# Patient Record
Sex: Female | Born: 1975
Health system: Southern US, Community
[De-identification: ages and names within clinical notes are randomized; demographics above are authoritative.]

## PROBLEM LIST (undated history)

## (undated) DIAGNOSIS — I509 Heart failure, unspecified: Secondary | ICD-10-CM

## (undated) DIAGNOSIS — I252 Old myocardial infarction: Secondary | ICD-10-CM

## (undated) DIAGNOSIS — M1990 Unspecified osteoarthritis, unspecified site: Secondary | ICD-10-CM

## (undated) DIAGNOSIS — F32A Depression, unspecified: Secondary | ICD-10-CM

## (undated) DIAGNOSIS — Z973 Presence of spectacles and contact lenses: Secondary | ICD-10-CM

## (undated) DIAGNOSIS — Z972 Presence of dental prosthetic device (complete) (partial): Secondary | ICD-10-CM

## (undated) DIAGNOSIS — M549 Dorsalgia, unspecified: Secondary | ICD-10-CM

## (undated) DIAGNOSIS — A419 Sepsis, unspecified organism: Secondary | ICD-10-CM

## (undated) DIAGNOSIS — M542 Cervicalgia: Secondary | ICD-10-CM

## (undated) DIAGNOSIS — I219 Acute myocardial infarction, unspecified: Secondary | ICD-10-CM

## (undated) DIAGNOSIS — J189 Pneumonia, unspecified organism: Secondary | ICD-10-CM

## (undated) DIAGNOSIS — Z9889 Other specified postprocedural states: Secondary | ICD-10-CM

## (undated) DIAGNOSIS — K5090 Crohn's disease, unspecified, without complications: Secondary | ICD-10-CM

## (undated) DIAGNOSIS — R42 Dizziness and giddiness: Secondary | ICD-10-CM

## (undated) DIAGNOSIS — Z9641 Presence of insulin pump (external) (internal): Secondary | ICD-10-CM

## (undated) DIAGNOSIS — E7800 Pure hypercholesterolemia, unspecified: Secondary | ICD-10-CM

## (undated) DIAGNOSIS — K8020 Calculus of gallbladder without cholecystitis without obstruction: Secondary | ICD-10-CM

## (undated) DIAGNOSIS — F909 Attention-deficit hyperactivity disorder, unspecified type: Secondary | ICD-10-CM

## (undated) DIAGNOSIS — K219 Gastro-esophageal reflux disease without esophagitis: Secondary | ICD-10-CM

## (undated) DIAGNOSIS — I2510 Atherosclerotic heart disease of native coronary artery without angina pectoris: Secondary | ICD-10-CM

## (undated) DIAGNOSIS — Z9884 Bariatric surgery status: Secondary | ICD-10-CM

## (undated) DIAGNOSIS — G8929 Other chronic pain: Secondary | ICD-10-CM

## (undated) DIAGNOSIS — E785 Hyperlipidemia, unspecified: Secondary | ICD-10-CM

## (undated) DIAGNOSIS — H547 Unspecified visual loss: Secondary | ICD-10-CM

## (undated) DIAGNOSIS — G709 Myoneural disorder, unspecified: Secondary | ICD-10-CM

## (undated) DIAGNOSIS — E119 Type 2 diabetes mellitus without complications: Secondary | ICD-10-CM

## (undated) DIAGNOSIS — I499 Cardiac arrhythmia, unspecified: Secondary | ICD-10-CM

## (undated) DIAGNOSIS — F329 Major depressive disorder, single episode, unspecified: Secondary | ICD-10-CM

## (undated) DIAGNOSIS — G629 Polyneuropathy, unspecified: Secondary | ICD-10-CM

## (undated) DIAGNOSIS — G43909 Migraine, unspecified, not intractable, without status migrainosus: Secondary | ICD-10-CM

## (undated) DIAGNOSIS — F419 Anxiety disorder, unspecified: Secondary | ICD-10-CM

## (undated) DIAGNOSIS — K279 Peptic ulcer, site unspecified, unspecified as acute or chronic, without hemorrhage or perforation: Secondary | ICD-10-CM

## (undated) DIAGNOSIS — G4730 Sleep apnea, unspecified: Secondary | ICD-10-CM

## (undated) DIAGNOSIS — I10 Essential (primary) hypertension: Secondary | ICD-10-CM

## (undated) DIAGNOSIS — T7840XA Allergy, unspecified, initial encounter: Secondary | ICD-10-CM

## (undated) DIAGNOSIS — N189 Chronic kidney disease, unspecified: Secondary | ICD-10-CM

## (undated) DIAGNOSIS — R51 Headache: Secondary | ICD-10-CM

## (undated) DIAGNOSIS — N289 Disorder of kidney and ureter, unspecified: Secondary | ICD-10-CM

## (undated) DIAGNOSIS — D649 Anemia, unspecified: Secondary | ICD-10-CM

## (undated) DIAGNOSIS — I4581 Long QT syndrome: Secondary | ICD-10-CM

## (undated) DIAGNOSIS — G2581 Restless legs syndrome: Secondary | ICD-10-CM

## (undated) DIAGNOSIS — I420 Dilated cardiomyopathy: Secondary | ICD-10-CM

## (undated) HISTORY — DX: Pure hypercholesterolemia, unspecified: E78.00

## (undated) HISTORY — PX: SALPINGOOPHORECTOMY: SHX82

## (undated) HISTORY — DX: Anemia, unspecified: D64.9

## (undated) HISTORY — DX: Anxiety disorder, unspecified: F41.9

## (undated) HISTORY — DX: Calculus of gallbladder without cholecystitis without obstruction: K80.20

## (undated) HISTORY — DX: Major depressive disorder, single episode, unspecified: F32.9

## (undated) HISTORY — PX: OTHER SURGICAL HISTORY: SHX169

## (undated) HISTORY — DX: Old myocardial infarction: I25.2

## (undated) HISTORY — DX: Dorsalgia, unspecified: M54.9

## (undated) HISTORY — DX: Other chronic pain: G89.29

## (undated) HISTORY — DX: Dilated cardiomyopathy: I42.0

## (undated) HISTORY — DX: Cervicalgia: M54.2

## (undated) HISTORY — DX: Depression, unspecified: F32.A

## (undated) HISTORY — DX: Presence of insulin pump (external) (internal): Z96.41

## (undated) HISTORY — DX: Allergy, unspecified, initial encounter: T78.40XA

---

## 2004-07-18 HISTORY — PX: CARDIAC CATHETERIZATION: SHX172

## 2004-08-14 DIAGNOSIS — I219 Acute myocardial infarction, unspecified: Secondary | ICD-10-CM

## 2004-08-14 HISTORY — DX: Acute myocardial infarction, unspecified: I21.9

## 2004-08-14 HISTORY — PX: CERVICAL FUSION: SHX112

## 2007-07-22 ENCOUNTER — Emergency Department: Payer: Self-pay | Admitting: Emergency Medicine

## 2007-10-03 ENCOUNTER — Emergency Department: Payer: Self-pay | Admitting: Internal Medicine

## 2008-06-18 ENCOUNTER — Emergency Department: Payer: Self-pay | Admitting: Emergency Medicine

## 2009-07-10 ENCOUNTER — Emergency Department: Payer: Self-pay | Admitting: Emergency Medicine

## 2009-08-14 HISTORY — PX: BACK SURGERY: SHX140

## 2010-05-12 ENCOUNTER — Emergency Department: Payer: Self-pay | Admitting: Emergency Medicine

## 2010-05-17 ENCOUNTER — Encounter: Admission: RE | Admit: 2010-05-17 | Discharge: 2010-05-17 | Payer: Self-pay | Admitting: Unknown Physician Specialty

## 2010-06-02 ENCOUNTER — Ambulatory Visit: Payer: Self-pay | Admitting: Unknown Physician Specialty

## 2010-06-06 ENCOUNTER — Ambulatory Visit: Payer: Self-pay | Admitting: Anesthesiology

## 2010-06-07 ENCOUNTER — Inpatient Hospital Stay: Payer: Self-pay | Admitting: Unknown Physician Specialty

## 2010-06-12 ENCOUNTER — Emergency Department: Payer: Self-pay | Admitting: Emergency Medicine

## 2010-06-21 ENCOUNTER — Ambulatory Visit: Payer: Self-pay | Admitting: Cardiology

## 2011-03-08 ENCOUNTER — Ambulatory Visit: Payer: Self-pay | Admitting: Pain Medicine

## 2011-03-13 ENCOUNTER — Ambulatory Visit: Payer: Self-pay | Admitting: Pain Medicine

## 2011-04-10 ENCOUNTER — Ambulatory Visit: Payer: Self-pay | Admitting: Pain Medicine

## 2011-04-19 ENCOUNTER — Ambulatory Visit: Payer: Self-pay | Admitting: Pain Medicine

## 2011-05-15 ENCOUNTER — Inpatient Hospital Stay: Payer: Self-pay | Admitting: Family Medicine

## 2011-08-16 ENCOUNTER — Ambulatory Visit: Payer: Self-pay | Admitting: Pain Medicine

## 2011-08-30 ENCOUNTER — Ambulatory Visit: Payer: Self-pay | Admitting: Pain Medicine

## 2011-09-13 ENCOUNTER — Emergency Department: Payer: Self-pay | Admitting: Emergency Medicine

## 2011-09-14 ENCOUNTER — Ambulatory Visit: Payer: Self-pay | Admitting: Pain Medicine

## 2011-10-03 ENCOUNTER — Ambulatory Visit: Payer: Self-pay | Admitting: Pain Medicine

## 2011-11-01 ENCOUNTER — Ambulatory Visit: Payer: Self-pay | Admitting: Pain Medicine

## 2011-11-30 ENCOUNTER — Ambulatory Visit: Payer: Self-pay | Admitting: Pain Medicine

## 2011-12-13 ENCOUNTER — Ambulatory Visit: Payer: Self-pay | Admitting: Pain Medicine

## 2011-12-28 ENCOUNTER — Ambulatory Visit: Payer: Self-pay | Admitting: Pain Medicine

## 2012-01-17 ENCOUNTER — Ambulatory Visit: Payer: Self-pay | Admitting: Pain Medicine

## 2012-01-29 ENCOUNTER — Ambulatory Visit: Payer: Self-pay | Admitting: Pain Medicine

## 2012-02-26 ENCOUNTER — Ambulatory Visit: Payer: Self-pay | Admitting: Pain Medicine

## 2012-03-12 ENCOUNTER — Ambulatory Visit: Payer: Self-pay | Admitting: Pain Medicine

## 2012-03-18 ENCOUNTER — Ambulatory Visit: Payer: Self-pay | Admitting: Pain Medicine

## 2012-04-25 ENCOUNTER — Ambulatory Visit: Payer: Self-pay | Admitting: Pain Medicine

## 2012-04-26 ENCOUNTER — Ambulatory Visit: Payer: Self-pay | Admitting: Sports Medicine

## 2012-05-28 ENCOUNTER — Ambulatory Visit: Payer: Self-pay | Admitting: Pain Medicine

## 2012-06-10 ENCOUNTER — Ambulatory Visit: Payer: Self-pay | Admitting: Pain Medicine

## 2012-06-27 ENCOUNTER — Ambulatory Visit: Payer: Self-pay | Admitting: Pain Medicine

## 2012-07-25 ENCOUNTER — Ambulatory Visit: Payer: Self-pay | Admitting: Pain Medicine

## 2012-08-22 ENCOUNTER — Ambulatory Visit: Payer: Self-pay | Admitting: Pain Medicine

## 2012-09-04 ENCOUNTER — Emergency Department: Payer: Self-pay | Admitting: Internal Medicine

## 2012-09-17 ENCOUNTER — Ambulatory Visit: Payer: Self-pay | Admitting: Pain Medicine

## 2012-09-20 ENCOUNTER — Ambulatory Visit: Payer: Self-pay | Admitting: Orthopedic Surgery

## 2012-09-25 ENCOUNTER — Ambulatory Visit: Payer: Self-pay | Admitting: Pain Medicine

## 2012-10-24 ENCOUNTER — Ambulatory Visit: Payer: Self-pay | Admitting: Pain Medicine

## 2012-11-04 ENCOUNTER — Ambulatory Visit: Payer: Self-pay | Admitting: Pain Medicine

## 2012-11-21 ENCOUNTER — Ambulatory Visit: Payer: Self-pay | Admitting: Pain Medicine

## 2012-12-02 ENCOUNTER — Ambulatory Visit: Payer: Self-pay | Admitting: Pain Medicine

## 2012-12-19 ENCOUNTER — Ambulatory Visit: Payer: Self-pay | Admitting: Pain Medicine

## 2013-01-22 ENCOUNTER — Ambulatory Visit: Payer: Self-pay | Admitting: Pain Medicine

## 2013-02-02 ENCOUNTER — Emergency Department: Payer: Self-pay | Admitting: Internal Medicine

## 2013-02-20 ENCOUNTER — Ambulatory Visit: Payer: Self-pay | Admitting: Pain Medicine

## 2013-03-01 ENCOUNTER — Ambulatory Visit: Payer: Self-pay | Admitting: Pain Medicine

## 2013-03-19 ENCOUNTER — Ambulatory Visit: Payer: Self-pay | Admitting: Pain Medicine

## 2013-04-18 ENCOUNTER — Other Ambulatory Visit: Payer: Self-pay | Admitting: Neurosurgery

## 2013-04-22 ENCOUNTER — Ambulatory Visit: Payer: Self-pay | Admitting: Pain Medicine

## 2013-04-24 ENCOUNTER — Ambulatory Visit: Payer: Self-pay | Admitting: Obstetrics and Gynecology

## 2013-05-01 ENCOUNTER — Inpatient Hospital Stay (HOSPITAL_COMMUNITY): Admission: RE | Admit: 2013-05-01 | Payer: Self-pay | Source: Ambulatory Visit

## 2013-05-06 ENCOUNTER — Encounter (HOSPITAL_COMMUNITY): Payer: Self-pay

## 2013-05-06 ENCOUNTER — Encounter (HOSPITAL_COMMUNITY)
Admission: RE | Admit: 2013-05-06 | Discharge: 2013-05-06 | Disposition: A | Discharge status: Home / Self Care | Payer: Medicaid Other | Source: Ambulatory Visit | Attending: Neurosurgery | Admitting: Neurosurgery

## 2013-05-06 ENCOUNTER — Ambulatory Visit (HOSPITAL_COMMUNITY)
Admission: RE | Admit: 2013-05-06 | Discharge: 2013-05-06 | Disposition: A | Discharge status: Home / Self Care | Payer: Medicaid Other | Source: Ambulatory Visit | Attending: Anesthesiology | Admitting: Anesthesiology

## 2013-05-06 ENCOUNTER — Encounter (HOSPITAL_COMMUNITY): Payer: Self-pay | Admitting: Pharmacy Technician

## 2013-05-06 DIAGNOSIS — Z01818 Encounter for other preprocedural examination: Secondary | ICD-10-CM | POA: Insufficient documentation

## 2013-05-06 DIAGNOSIS — I10 Essential (primary) hypertension: Secondary | ICD-10-CM | POA: Insufficient documentation

## 2013-05-06 DIAGNOSIS — H539 Unspecified visual disturbance: Secondary | ICD-10-CM | POA: Insufficient documentation

## 2013-05-06 DIAGNOSIS — Z01812 Encounter for preprocedural laboratory examination: Secondary | ICD-10-CM | POA: Insufficient documentation

## 2013-05-06 DIAGNOSIS — E669 Obesity, unspecified: Secondary | ICD-10-CM | POA: Insufficient documentation

## 2013-05-06 DIAGNOSIS — I2510 Atherosclerotic heart disease of native coronary artery without angina pectoris: Secondary | ICD-10-CM | POA: Insufficient documentation

## 2013-05-06 DIAGNOSIS — E1139 Type 2 diabetes mellitus with other diabetic ophthalmic complication: Secondary | ICD-10-CM | POA: Insufficient documentation

## 2013-05-06 HISTORY — DX: Type 2 diabetes mellitus without complications: E11.9

## 2013-05-06 HISTORY — DX: Acute myocardial infarction, unspecified: I21.9

## 2013-05-06 HISTORY — DX: Atherosclerotic heart disease of native coronary artery without angina pectoris: I25.10

## 2013-05-06 HISTORY — DX: Unspecified osteoarthritis, unspecified site: M19.90

## 2013-05-06 HISTORY — DX: Headache: R51

## 2013-05-06 HISTORY — DX: Essential (primary) hypertension: I10

## 2013-05-06 HISTORY — DX: Unspecified visual loss: H54.7

## 2013-05-06 HISTORY — DX: Polyneuropathy, unspecified: G62.9

## 2013-05-06 LAB — SURGICAL PCR SCREEN: MRSA, PCR: NEGATIVE

## 2013-05-06 LAB — BASIC METABOLIC PANEL
BUN: 14 mg/dL (ref 6–23)
Chloride: 105 mEq/L (ref 96–112)
Creatinine, Ser: 0.57 mg/dL (ref 0.50–1.10)
GFR calc Af Amer: >90 mL/min (ref >90)
GFR calc non Af Amer: >90 mL/min (ref >90)
Glucose, Bld: 57 mg/dL — ABNORMAL LOW (ref 70–99)
Potassium: 4 mEq/L (ref 3.5–5.1)
Sodium: 144 mEq/L (ref 135–145)

## 2013-05-06 LAB — CBC
HCT: 38.4 % (ref 36.0–46.0)
Hemoglobin: 12.9 g/dL (ref 12.0–15.0)
MCH: 29.4 pg (ref 26.0–34.0)
MCHC: 33.6 g/dL (ref 30.0–36.0)
MCV: 87.5 fL (ref 78.0–100.0)
Platelets: 200 10*3/uL (ref 150–400)
RBC: 4.39 MIL/uL (ref 3.87–5.11)
RDW: 13.4 % (ref 11.5–15.5)
WBC: 6.8 10*3/uL (ref 4.0–10.5)

## 2013-05-06 MED ORDER — DEXAMETHASONE SODIUM PHOSPHATE 10 MG/ML IJ SOLN
10.0000 mg | INTRAMUSCULAR | Status: AC
  Administered 2013-05-07: 10 mg via INTRAVENOUS
  Filled 2013-05-06: qty 1

## 2013-05-06 MED ORDER — CEFAZOLIN SODIUM-DEXTROSE 2-3 GM-% IV SOLR
2.0000 g | INTRAVENOUS | Status: AC
  Administered 2013-05-07: 2 g via INTRAVENOUS
  Filled 2013-05-06: qty 50

## 2013-05-06 NOTE — Pre-Procedure Instructions (Addendum)
Marissa [name]  05/06/2013   Your procedure is scheduled on: Wednesday, September 24th.  Report to [facility], Main Entrance/ Entrance "A" at 12 noon.  Call this number if you have problems the morning of surgery: [phone]   Remember:   Do not eat food or drink liquids after midnight.   Take these medicines the morning of surgery with A SIP OF WATER: - Tiadizine, Coreg, Gabapentin.   Do not wear jewelry, make-up or nail polish.  Do not wear lotions, powders, or perfumes.   Do not shave 48 hours prior to surgery. Men may shave face and neck.  Do not bring valuables to the hospital.  [facility] is not responsible  for any belongings or valuables.  Contacts, dentures or bridgework may not be worn into surgery.  Leave suitcase in the car. After surgery it may be brought to your room.  For patients admitted to the hospital, checkout time is 11:00 AM the day of discharge.   Patients discharged the day of surgery will not be allowed to drive home.  Name and phone number of your driver: -   Special Instructions: Shower using CHG 2 nights before surgery and the night before surgery.  If you shower the day of surgery use CHG.  Use special wash - you have one bottle of CHG for all showers.  You should use approximately 1/3 of the bottle for each shower.   Please read over the following fact sheets that you were given: Pain Booklet, Coughing and Deep Breathing and Surgical Site Infection Prevention

## 2013-05-06 NOTE — Progress Notes (Signed)
Anesthesia PAT Evaluation:  Patient is a 37 year old female posted for one level lumbar laminectomy/decompression microdiscectomy on 05/07/13 by Dr. Wynetta [name].  History includes obesity (BMI 38), CAD with 50-60% mid LAD lesion by 2005 cath, possible MI "due to medication" ~ 2005/2006 (no evidence of ischemia or infarct on nuclear stress test '11), HTN, non-smoker, headaches, decreased vision related to DM, neuropathy, cervical fusion, lumbar laminectomy '11.  PCP is Dr. Marisue [name] with [facility].  She is not followed by cardiology.  Echo on 05/14/11 showed normal LV systolic function, EF 55%, mild MR, trace TR  Her last nuclear stress test was on 12/27/09 and showed no evidence of infarction or ischemia, normal LV function, EF 51%.  She had a stress test in 2005 that showed a fixed anterior defect with subendocardial scar could not be excluded.  She subsequently was referred for a cardiac cath on 07/18/04 at [facility] that showed normal LM, LAD was widely patent and normal throughout most of its course except for a focal cylindrical narrowing in the mid LAD estimated to be 50-60%.  First septal branch arises proximal to the stenosis.  Diagonal branches are medium and small, respectively.  Entire Cx system appears normal.  RCA is a dominant vessel with minor luminal irregularities in the distal segment proximal to the bifurcation of the PL and PDA branches.  EF 50-55%. No regional dysfunction seen.  EKG on 05/06/13 showed NSR.  CXR from 05/06/13 showed no active cardiopulmonary disease.  Preoperative labs noted.  CBC WNL.  Cr 0.57, glucose 57. Notes indicate that her last A1C in 01/2013 was 7.6%.  Patient denies chest pain, SOB, and edema. She is not very active, however, due to severe back and leg pain.  She has been fairly inactive for years. Exam show a pleasant, black female in NAD.  Obese.  Heart RRR, no murmur noted.  Lungs clear.  Patient with known 50-60% mid LAD lesion by  cath in 2005 otherwise no significant disease.  She thought she may have had an MI in the past, but I can't find records to confirm this.  Last stress test in 2011 showed no evidence of infarct.  Her EKG is WNL.  She is asymptomatic.  If no acute changes then I would anticipate that she could proceed as planned. Anesthesiologist Dr. Katrinka [name] agrees with this plan.  (Of note, she is concerned about being a diabetic and having a late afternoon procedure.  She will considering talking with Dr. Lonie [name] office about either changing the time or allowing a light breakfast.  I have told her that she should not eat during the eight hours prior to her scheduled surgery, but can arrive to [facility] earlier than planned if needed so staff can more closely monitor her for hypoglycemia.)  Marissa [name] [facility] Short Stay Center/Anesthesiology Phone [phone] 05/06/2013 2:30 PM

## 2013-05-06 NOTE — Pre-Procedure Instructions (Signed)
Marissa [name]  05/06/2013   Your procedure is scheduled on:  Wednesdy, September 24th.  Report to [facility], Main Entrance / Entrance "A" at 12noon AM.  Call this number if you have problems the morning of surgery: [phone]   Remember:   Do not eat food or drink liquids after midnight.   Take these medicines the morning of surgery with A SIP OF WATER: Coreg, Tanyzidine, Gabapentin.   Do not wear jewelry, make-up or nail polish.  Do not wear lotions, powders, or perfumes. You may wear deodorant.  Do not shave 48 hours prior to surgery. Men may shave face and neck.  Do not bring valuables to the hospital.  [facility] is not responsible for any belongings or valuables.  Contacts, dentures or bridgework may not be worn into surgery.  Leave suitcase in the car. After surgery it may be brought to your room.  For patients admitted to the hospital, checkout time is 11:00 AM the day of discharge.   Patients discharged the day of surgery will not be allowed to drive home.  Name and phone number of your driver: -   Special Instructions: Shower with CHG wash (Bactoshield) tonight and again in the am prior to arriving to hospital.   Please read over the following fact sheets that you were given: Pain Booklet, Coughing and Deep Breathing and Surgical Site Infection Prevention

## 2013-05-07 ENCOUNTER — Encounter (HOSPITAL_COMMUNITY): Payer: Self-pay | Admitting: Vascular Surgery

## 2013-05-07 ENCOUNTER — Encounter (HOSPITAL_COMMUNITY): Payer: Self-pay | Admitting: *Deleted

## 2013-05-07 ENCOUNTER — Encounter (HOSPITAL_COMMUNITY)
Admission: RE | Disposition: A | Discharge status: Home / Self Care | Payer: Self-pay | Source: Ambulatory Visit | Attending: Neurosurgery

## 2013-05-07 ENCOUNTER — Ambulatory Visit (HOSPITAL_COMMUNITY): Payer: Medicare Other | Admitting: Anesthesiology

## 2013-05-07 ENCOUNTER — Ambulatory Visit (HOSPITAL_COMMUNITY): Payer: Medicare Other

## 2013-05-07 ENCOUNTER — Ambulatory Visit (HOSPITAL_COMMUNITY)
Admission: RE | Admit: 2013-05-07 | Discharge: 2013-05-08 | Disposition: A | Discharge status: Home / Self Care | Payer: Medicare Other | Source: Ambulatory Visit | Attending: Neurosurgery | Admitting: Neurosurgery

## 2013-05-07 DIAGNOSIS — I2510 Atherosclerotic heart disease of native coronary artery without angina pectoris: Secondary | ICD-10-CM | POA: Insufficient documentation

## 2013-05-07 DIAGNOSIS — E119 Type 2 diabetes mellitus without complications: Secondary | ICD-10-CM | POA: Insufficient documentation

## 2013-05-07 DIAGNOSIS — I10 Essential (primary) hypertension: Secondary | ICD-10-CM | POA: Insufficient documentation

## 2013-05-07 DIAGNOSIS — M5126 Other intervertebral disc displacement, lumbar region: Secondary | ICD-10-CM | POA: Insufficient documentation

## 2013-05-07 DIAGNOSIS — Z23 Encounter for immunization: Secondary | ICD-10-CM | POA: Insufficient documentation

## 2013-05-07 DIAGNOSIS — I252 Old myocardial infarction: Secondary | ICD-10-CM | POA: Insufficient documentation

## 2013-05-07 HISTORY — PX: LUMBAR LAMINECTOMY/DECOMPRESSION MICRODISCECTOMY: SHX5026

## 2013-05-07 LAB — GLUCOSE, CAPILLARY
Glucose-Capillary: 151 mg/dL — ABNORMAL HIGH (ref 70–99)
Glucose-Capillary: 195 mg/dL — ABNORMAL HIGH (ref 70–99)
Glucose-Capillary: 210 mg/dL — ABNORMAL HIGH (ref 70–99)
Glucose-Capillary: 300 mg/dL — ABNORMAL HIGH (ref 70–99)

## 2013-05-07 SURGERY — LUMBAR LAMINECTOMY/DECOMPRESSION MICRODISCECTOMY 1 LEVEL
Anesthesia: General | Site: Back | Laterality: Right | Wound class: Clean

## 2013-05-07 MED ORDER — CYCLOBENZAPRINE HCL 10 MG PO TABS
10.0000 mg | ORAL_TABLET | Freq: Three times a day (TID) | ORAL | Status: DC | PRN
  Administered 2013-05-07 – 2013-05-08 (×2): 10 mg via ORAL
  Filled 2013-05-07: qty 1

## 2013-05-07 MED ORDER — HYDROMORPHONE HCL PF 1 MG/ML IJ SOLN
0.5000 mg | INTRAMUSCULAR | Status: DC | PRN
  Administered 2013-05-07 – 2013-05-08 (×4): 1 mg via INTRAVENOUS
  Filled 2013-05-07 (×3): qty 1

## 2013-05-07 MED ORDER — BUPIVACAINE HCL (PF) 0.25 % IJ SOLN
INTRAMUSCULAR | Status: DC | PRN
  Administered 2013-05-07: 15 mL

## 2013-05-07 MED ORDER — NITROGLYCERIN 0.4 MG SL SUBL: 0.4000 mg | SUBLINGUAL_TABLET | SUBLINGUAL | Status: DC | PRN

## 2013-05-07 MED ORDER — INSULIN ASPART 100 UNIT/ML ~~LOC~~ SOLN
0.0000 [IU] | Freq: Every day | SUBCUTANEOUS | Status: DC
  Administered 2013-05-07: 5 [IU] via SUBCUTANEOUS

## 2013-05-07 MED ORDER — LIDOCAINE HCL (CARDIAC) 20 MG/ML IV SOLN
INTRAVENOUS | Status: DC | PRN
  Administered 2013-05-07: 80 mg via INTRAVENOUS

## 2013-05-07 MED ORDER — OXYCODONE HCL 5 MG PO TABS
5.0000 mg | ORAL_TABLET | Freq: Once | ORAL | Status: AC | PRN
  Administered 2013-05-07: 5 mg via ORAL

## 2013-05-07 MED ORDER — MENTHOL 3 MG MT LOZG: 1.0000 | LOZENGE | OROMUCOSAL | Status: DC | PRN

## 2013-05-07 MED ORDER — ROCURONIUM BROMIDE 100 MG/10ML IV SOLN
INTRAVENOUS | Status: DC | PRN
  Administered 2013-05-07: 50 mg via INTRAVENOUS

## 2013-05-07 MED ORDER — LIDOCAINE-EPINEPHRINE 1 %-1:100000 IJ SOLN
INTRAMUSCULAR | Status: DC | PRN
  Administered 2013-05-07: 10 mL

## 2013-05-07 MED ORDER — MEPERIDINE HCL 25 MG/ML IJ SOLN: 6.2500 mg | INTRAMUSCULAR | Status: DC | PRN

## 2013-05-07 MED ORDER — CARVEDILOL 6.25 MG PO TABS
6.2500 mg | ORAL_TABLET | Freq: Two times a day (BID) | ORAL | Status: DC
  Administered 2013-05-07 – 2013-05-08 (×2): 6.25 mg via ORAL
  Filled 2013-05-07 (×4): qty 1

## 2013-05-07 MED ORDER — SODIUM CHLORIDE 0.9 % IR SOLN
Status: DC | PRN
  Administered 2013-05-07: 16:00:00

## 2013-05-07 MED ORDER — LACTATED RINGERS IV SOLN
INTRAVENOUS | Status: DC | PRN
  Administered 2013-05-07 (×3): via INTRAVENOUS

## 2013-05-07 MED ORDER — HYDROMORPHONE HCL PF 1 MG/ML IJ SOLN
INTRAMUSCULAR | Status: AC
  Filled 2013-05-07: qty 1

## 2013-05-07 MED ORDER — FENTANYL CITRATE 0.05 MG/ML IJ SOLN
INTRAMUSCULAR | Status: DC | PRN
  Administered 2013-05-07: 50 ug via INTRAVENOUS
  Administered 2013-05-07: 100 ug via INTRAVENOUS

## 2013-05-07 MED ORDER — OXYCODONE HCL 5 MG/5ML PO SOLN: 5.0000 mg | Freq: Once | ORAL | Status: AC | PRN

## 2013-05-07 MED ORDER — HYDROMORPHONE HCL PF 1 MG/ML IJ SOLN
0.2500 mg | INTRAMUSCULAR | Status: DC | PRN
  Administered 2013-05-07 (×4): 0.5 mg via INTRAVENOUS

## 2013-05-07 MED ORDER — PHENOL 1.4 % MT LIQD: 1.0000 | OROMUCOSAL | Status: DC | PRN

## 2013-05-07 MED ORDER — METFORMIN HCL 500 MG PO TABS
1000.0000 mg | ORAL_TABLET | Freq: Two times a day (BID) | ORAL | Status: DC
  Administered 2013-05-08: 1000 mg via ORAL
  Filled 2013-05-07 (×3): qty 2

## 2013-05-07 MED ORDER — 0.9 % SODIUM CHLORIDE (POUR BTL) OPTIME
TOPICAL | Status: DC | PRN
  Administered 2013-05-07: 1000 mL

## 2013-05-07 MED ORDER — HYPROMELLOSE (GONIOSCOPIC) 2.5 % OP SOLN: 1.0000 [drp] | Freq: Four times a day (QID) | OPHTHALMIC | Status: DC | PRN

## 2013-05-07 MED ORDER — ASPIRIN-ACETAMINOPHEN-CAFFEINE 250-250-65 MG PO TABS: 2.0000 | ORAL_TABLET | Freq: Four times a day (QID) | ORAL | Status: DC | PRN

## 2013-05-07 MED ORDER — INSULIN ASPART 100 UNIT/ML ~~LOC~~ SOLN
0.0000 [IU] | Freq: Three times a day (TID) | SUBCUTANEOUS | Status: DC
  Administered 2013-05-08: 20 [IU] via SUBCUTANEOUS
  Administered 2013-05-08: 7 [IU] via SUBCUTANEOUS

## 2013-05-07 MED ORDER — OXYCODONE HCL 5 MG PO TABS
15.0000 mg | ORAL_TABLET | Freq: Four times a day (QID) | ORAL | Status: DC | PRN
  Administered 2013-05-08 (×2): 15 mg via ORAL
  Filled 2013-05-07 (×2): qty 3

## 2013-05-07 MED ORDER — PROMETHAZINE HCL 25 MG/ML IJ SOLN: 6.2500 mg | INTRAMUSCULAR | Status: DC | PRN

## 2013-05-07 MED ORDER — ONDANSETRON HCL 4 MG/2ML IJ SOLN
INTRAMUSCULAR | Status: DC | PRN
  Administered 2013-05-07: 4 mg via INTRAVENOUS

## 2013-05-07 MED ORDER — LACTATED RINGERS IV SOLN
INTRAVENOUS | Status: DC
  Administered 2013-05-07: 14:00:00 via INTRAVENOUS

## 2013-05-07 MED ORDER — NEOSTIGMINE METHYLSULFATE 1 MG/ML IJ SOLN
INTRAMUSCULAR | Status: DC | PRN
  Administered 2013-05-07: 5 mg via INTRAVENOUS

## 2013-05-07 MED ORDER — GLYCOPYRROLATE 0.2 MG/ML IJ SOLN
INTRAMUSCULAR | Status: DC | PRN
  Administered 2013-05-07: 0.6 mg via INTRAVENOUS

## 2013-05-07 MED ORDER — THROMBIN 5000 UNITS EX SOLR
CUTANEOUS | Status: DC | PRN
  Administered 2013-05-07 (×2): 5000 [IU] via TOPICAL

## 2013-05-07 MED ORDER — PHENYLEPHRINE HCL 10 MG/ML IJ SOLN
INTRAMUSCULAR | Status: DC | PRN
  Administered 2013-05-07 (×2): 80 ug via INTRAVENOUS

## 2013-05-07 MED ORDER — LISINOPRIL 20 MG PO TABS
20.0000 mg | ORAL_TABLET | Freq: Every day | ORAL | Status: DC
  Administered 2013-05-07 – 2013-05-08 (×2): 20 mg via ORAL
  Filled 2013-05-07 (×2): qty 1

## 2013-05-07 MED ORDER — ONDANSETRON HCL 4 MG/2ML IJ SOLN
4.0000 mg | Freq: Four times a day (QID) | INTRAMUSCULAR | Status: DC | PRN
  Administered 2013-05-08: 4 mg via INTRAVENOUS
  Filled 2013-05-07: qty 2

## 2013-05-07 MED ORDER — PHENYLEPHRINE HCL 10 MG/ML IJ SOLN
10.0000 mg | INTRAVENOUS | Status: DC | PRN
  Administered 2013-05-07: 10 ug/min via INTRAVENOUS

## 2013-05-07 MED ORDER — TIZANIDINE HCL 4 MG PO TABS
4.0000 mg | ORAL_TABLET | Freq: Three times a day (TID) | ORAL | Status: DC
  Administered 2013-05-07 – 2013-05-08 (×2): 4 mg via ORAL
  Filled 2013-05-07 (×4): qty 1

## 2013-05-07 MED ORDER — PROPOFOL 10 MG/ML IV BOLUS
INTRAVENOUS | Status: DC | PRN
  Administered 2013-05-07: 170 mg via INTRAVENOUS
  Administered 2013-05-07 (×2): 30 mg via INTRAVENOUS

## 2013-05-07 MED ORDER — CYCLOBENZAPRINE HCL 10 MG PO TABS
ORAL_TABLET | ORAL | Status: AC
  Filled 2013-05-07: qty 1

## 2013-05-07 MED ORDER — MIDAZOLAM HCL 5 MG/5ML IJ SOLN
INTRAMUSCULAR | Status: DC | PRN
  Administered 2013-05-07: 2 mg via INTRAVENOUS

## 2013-05-07 MED ORDER — MIDAZOLAM HCL 2 MG/2ML IJ SOLN: 0.5000 mg | Freq: Once | INTRAMUSCULAR | Status: DC | PRN

## 2013-05-07 MED ORDER — OXYCODONE HCL 5 MG PO TABS
ORAL_TABLET | ORAL | Status: AC
  Filled 2013-05-07: qty 1

## 2013-05-07 MED ORDER — GABAPENTIN 300 MG PO CAPS
300.0000 mg | ORAL_CAPSULE | Freq: Three times a day (TID) | ORAL | Status: DC
  Administered 2013-05-07 – 2013-05-08 (×2): 300 mg via ORAL
  Filled 2013-05-07 (×4): qty 1

## 2013-05-07 MED ORDER — SIMVASTATIN 40 MG PO TABS
40.0000 mg | ORAL_TABLET | Freq: Every day | ORAL | Status: DC
  Administered 2013-05-07: 40 mg via ORAL
  Filled 2013-05-07 (×2): qty 1

## 2013-05-07 MED ORDER — HEMOSTATIC AGENTS (NO CHARGE) OPTIME
TOPICAL | Status: DC | PRN
  Administered 2013-05-07: 1 via TOPICAL

## 2013-05-07 MED ORDER — INSULIN ASPART 100 UNIT/ML FLEXPEN: 10.0000 [IU] | PEN_INJECTOR | Freq: Three times a day (TID) | SUBCUTANEOUS | Status: DC

## 2013-05-07 MED ORDER — POLYVINYL ALCOHOL 1.4 % OP SOLN
1.0000 [drp] | Freq: Four times a day (QID) | OPHTHALMIC | Status: DC | PRN
  Filled 2013-05-07: qty 15

## 2013-05-07 SURGICAL SUPPLY — 62 items
BAG DECANTER FOR FLEXI CONT (MISCELLANEOUS) ×2 IMPLANT
BENZOIN TINCTURE PRP APPL 2/3 (GAUZE/BANDAGES/DRESSINGS) ×2 IMPLANT
BLADE SURG 11 STRL SS (BLADE) ×2 IMPLANT
BLADE SURG ROTATE 9660 (MISCELLANEOUS) IMPLANT
BRUSH SCRUB EZ PLAIN DRY (MISCELLANEOUS) ×2 IMPLANT
BUR MATCHSTICK NEURO 3.0 LAGG (BURR) ×2 IMPLANT
BUR PRECISION FLUTE 6.0 (BURR) ×2 IMPLANT
CANISTER SUCTION 2500CC (MISCELLANEOUS) ×2 IMPLANT
CLOTH BEACON ORANGE TIMEOUT ST (SAFETY) ×2 IMPLANT
CONT SPEC 4OZ CLIKSEAL STRL BL (MISCELLANEOUS) ×2 IMPLANT
DECANTER SPIKE VIAL GLASS SM (MISCELLANEOUS) ×2 IMPLANT
DERMABOND ADHESIVE PROPEN (GAUZE/BANDAGES/DRESSINGS) ×1
DERMABOND ADVANCED (GAUZE/BANDAGES/DRESSINGS)
DERMABOND ADVANCED .7 DNX12 (GAUZE/BANDAGES/DRESSINGS) IMPLANT
DERMABOND ADVANCED .7 DNX6 (GAUZE/BANDAGES/DRESSINGS) ×1 IMPLANT
DRAPE LAPAROTOMY 100X72X124 (DRAPES) ×2 IMPLANT
DRAPE MICROSCOPE LEICA (MISCELLANEOUS) ×2 IMPLANT
DRAPE MICROSCOPE ZEISS OPMI (DRAPES) IMPLANT
DRAPE POUCH INSTRU U-SHP 10X18 (DRAPES) ×2 IMPLANT
DRAPE PROXIMA HALF (DRAPES) IMPLANT
DRAPE SURG 17X23 STRL (DRAPES) ×2 IMPLANT
DRSG OPSITE 4X5.5 SM (GAUZE/BANDAGES/DRESSINGS) IMPLANT
DRSG OPSITE POSTOP 4X6 (GAUZE/BANDAGES/DRESSINGS) ×2 IMPLANT
DURAPREP 26ML APPLICATOR (WOUND CARE) ×2 IMPLANT
ELECT BLADE 4.0 EZ CLEAN MEGAD (MISCELLANEOUS) ×2
ELECT REM PT RETURN 9FT ADLT (ELECTROSURGICAL) ×2
ELECTRODE BLDE 4.0 EZ CLN MEGD (MISCELLANEOUS) ×1 IMPLANT
ELECTRODE REM PT RTRN 9FT ADLT (ELECTROSURGICAL) ×1 IMPLANT
GAUZE SPONGE 4X4 16PLY XRAY LF (GAUZE/BANDAGES/DRESSINGS) IMPLANT
GLOVE BIO SURGEON STRL SZ8 (GLOVE) ×2 IMPLANT
GLOVE BIOGEL PI IND STRL 7.5 (GLOVE) ×1 IMPLANT
GLOVE BIOGEL PI IND STRL 8 (GLOVE) ×1 IMPLANT
GLOVE BIOGEL PI INDICATOR 7.5 (GLOVE) ×1
GLOVE BIOGEL PI INDICATOR 8 (GLOVE) ×1
GLOVE ECLIPSE 7.0 STRL STRAW (GLOVE) ×2 IMPLANT
GLOVE ECLIPSE 7.5 STRL STRAW (GLOVE) ×6 IMPLANT
GLOVE EXAM NITRILE LRG STRL (GLOVE) IMPLANT
GLOVE EXAM NITRILE MD LF STRL (GLOVE) ×2 IMPLANT
GLOVE EXAM NITRILE XL STR (GLOVE) IMPLANT
GLOVE EXAM NITRILE XS STR PU (GLOVE) IMPLANT
GLOVE INDICATOR 8.5 STRL (GLOVE) ×2 IMPLANT
GOWN BRE IMP SLV AUR LG STRL (GOWN DISPOSABLE) ×2 IMPLANT
GOWN BRE IMP SLV AUR XL STRL (GOWN DISPOSABLE) ×2 IMPLANT
GOWN STRL REIN 2XL LVL4 (GOWN DISPOSABLE) ×2 IMPLANT
KIT BASIN OR (CUSTOM PROCEDURE TRAY) ×2 IMPLANT
KIT ROOM TURNOVER OR (KITS) ×2 IMPLANT
NEEDLE HYPO 22GX1.5 SAFETY (NEEDLE) ×2 IMPLANT
NEEDLE SPNL 22GX3.5 QUINCKE BK (NEEDLE) ×2 IMPLANT
NS IRRIG 1000ML POUR BTL (IV SOLUTION) ×2 IMPLANT
PACK LAMINECTOMY NEURO (CUSTOM PROCEDURE TRAY) ×2 IMPLANT
RUBBERBAND STERILE (MISCELLANEOUS) ×4 IMPLANT
SPONGE GAUZE 4X4 12PLY (GAUZE/BANDAGES/DRESSINGS) ×2 IMPLANT
SPONGE SURGIFOAM ABS GEL SZ50 (HEMOSTASIS) ×2 IMPLANT
STRIP CLOSURE SKIN 1/2X4 (GAUZE/BANDAGES/DRESSINGS) ×2 IMPLANT
SUT VIC AB 0 CT1 18XCR BRD8 (SUTURE) ×1 IMPLANT
SUT VIC AB 0 CT1 8-18 (SUTURE) ×1
SUT VIC AB 2-0 CT1 18 (SUTURE) ×2 IMPLANT
SUT VICRYL 4-0 PS2 18IN ABS (SUTURE) ×2 IMPLANT
SYR 20ML ECCENTRIC (SYRINGE) ×2 IMPLANT
TOWEL OR 17X24 6PK STRL BLUE (TOWEL DISPOSABLE) ×2 IMPLANT
TOWEL OR 17X26 10 PK STRL BLUE (TOWEL DISPOSABLE) ×2 IMPLANT
WATER STERILE IRR 1000ML POUR (IV SOLUTION) ×2 IMPLANT

## 2013-05-07 NOTE — Anesthesia Procedure Notes (Signed)
Procedure Name: Intubation Date/Time: 05/07/2013 3:33 PM Performed by: Brien Mates DOBSON Pre-anesthesia Checklist: Patient identified, Emergency Drugs available, Suction available, Patient being monitored and Timeout performed Patient Re-evaluated:Patient Re-evaluated prior to inductionOxygen Delivery Method: Circle system utilized Preoxygenation: Pre-oxygenation with 100% oxygen Intubation Type: IV induction Ventilation: Mask ventilation without difficulty Laryngoscope Size: Miller and 2 Grade View: Grade I Tube type: Oral Tube size: 7.0 mm Number of attempts: 1 Airway Equipment and Method: Stylet Placement Confirmation: ETT inserted through vocal cords under direct vision,  positive ETCO2 and breath sounds checked- equal and bilateral Secured at: 22 cm Tube secured with: Tape Dental Injury: Teeth and Oropharynx as per pre-operative assessment

## 2013-05-07 NOTE — Transfer of Care (Signed)
Immediate Anesthesia Transfer of Care Note  Patient: Marissa [name]  Procedure(s) Performed: Procedure(s): Right Lumbar one-two laminectomy (Right)  Patient Location: PACU  Anesthesia Type:General  Level of Consciousness: sedated  Airway & Oxygen Therapy: Patient Spontanous Breathing and Patient connected to face mask oxygen  Post-op Assessment: Report given to PACU RN and Post -op Vital signs reviewed and stable  Post vital signs: Reviewed and stable  Complications: No apparent anesthesia complications

## 2013-05-07 NOTE — Op Note (Signed)
Preoperative diagnosis: L2 radiculopathy from herniated nucleus pulposus L1-2 right  Postoperative diagnosis: Same  Procedure: Lumbar laminectomy microdiscectomy L1 to right with microdissection of the right L2 nerve root microscopic discectomy  Surgeon: Jillyn Hidden Tenzin Edelman  Assistant: Lisbeth Renshaw  Anesthesia: Gen.  EBL: Minimal  History of present illness: 37 year old female presents with progressive worsening back and right hip and leg pain rating down the medial aspect her thigh consistent with an L2 nerve root pattern this verified that all forms of conservative treatment imaging revealed partially calcified disc herniation at L1 to causing severe thecal sac compression stenosis the patient takes her treatment progression of clinical syndrome and imaging findings I recommended a lumbar limiting microscopic L1-2 in the right I extensively reviewed the risks and benefits of the operation the patient as well as perioperative course expectations about alternatives of surgery she understood and agreed to proceed forward.  Operative procedure: Patient brought into the or was induced under general anesthesia positioned prone the Wilson frame her back was prepped and draped in routine sterile fashion. Preoperative x-ray localize the appropriate level so after infiltration of 10 cc lidocaine with epi a midline incision was made and Bovie light cautery was used to take down the subcutaneous tissues and subperiosteal dissections care lamina of L1 and L2 on the right. Interoperative x-ray confirmed this to be the appropriate level so the inferior aspect of lamina of L1 medial facet complexes and superior of the lamina of L2 was drilled down a high-speed drill laminotomy was begun with a 2 and 3 minute Kerrison punch exposing the ligamentum flavum which was removed in piecemeal fashion. The thecal sac was visualized and the medial facet complexes further under bitten the L2 pedicle was palpated and under  microscopic illumination the L2 nerve root was dissected off the L2 pedicle the lateral aspect the canal was identified and under biting the medial facet complex. A 4 Penfield used to dissect the thecal sac off of a very large calcified disc herniation at L1-2. The disc space was incised the calcified dorsal aspect of it was divided and bitten away with one to Kerrison punch nerve hook was used to tease out several large free fragments underneath the calcified piece and then using a combination of pituitary rongeurs nerve hooks and Epstein curettes the disc spaces cleanout radically. Again the discectomy there is no further stenosis on thecal sac or the L2 nerve root several arthritis removed a coronary dilator and hockey-stick easily passed medially underneath the thecal sac as well as out the L2 foramen to the wounds and copious irrigated meticulous in space was maintained Gelfoam was overlaid top of the dura the muscle and fascia reapproximated layers with after Vicryl and the skin was closed running 4 subcuticular benzoin and Steri-Strips were applied patient recovered in stable condition. At the end of case on it counts sponge counts were correct.

## 2013-05-07 NOTE — Anesthesia Postprocedure Evaluation (Signed)
Anesthesia Post Note  Patient: Marissa [name]  Procedure(s) Performed: Procedure(s) (LRB): Right Lumbar one-two laminectomy (Right)  Anesthesia type: General  Patient location: PACU  Post pain: Pain level controlled and Adequate analgesia  Post assessment: Post-op Vital signs reviewed, Patient's Cardiovascular Status Stable, Respiratory Function Stable, Patent Airway and Pain level controlled  Last Vitals:  Filed Vitals:   05/07/13 1816  BP: 122/86  Pulse: 87  Temp:   Resp: 19    Post vital signs: Reviewed and stable  Level of consciousness: awake, alert  and oriented  Complications: No apparent anesthesia complications

## 2013-05-07 NOTE — Anesthesia Preprocedure Evaluation (Addendum)
Anesthesia Evaluation  Patient identified by MRN, date of birth, ID band Patient awake    Reviewed: Allergy & Precautions, H&P , NPO status , Patient's Chart, lab work & pertinent test results, reviewed documented beta blocker date and time   History of Anesthesia Complications Negative for: history of anesthetic complications  Airway Mallampati: II TM Distance: >3 FB Neck ROM: Full    Dental  (+) Teeth Intact, Dental Advisory Given and Partial Lower   Pulmonary neg pulmonary ROS,  breath sounds clear to auscultation  Pulmonary exam normal       Cardiovascular hypertension, Pt. on medications and Pt. on home beta blockers - angina+ CAD Rhythm:Regular Rate:Normal  '11 stress test: no ischemia or scar, EF 51% '05 cath: single 50-60% mid LAD lesion, EF 55%   Neuro/Psych Back pain: narcotics    GI/Hepatic negative GI ROS, Neg liver ROS,   Endo/Other  diabetes (glu 57), Well Controlled, Type 2, Oral Hypoglycemic AgentsMorbid obesity  Renal/GU negative Renal ROS     Musculoskeletal   Abdominal (+) + obese,   Peds  Hematology negative hematology ROS (+)   Anesthesia Other Findings   Reproductive/Obstetrics                        Anesthesia Physical Anesthesia Plan  ASA: III  Anesthesia Plan: General   Post-op Pain Management:    Induction: Intravenous  Airway Management Planned: Oral ETT  Additional Equipment:   Intra-op Plan:   Post-operative Plan: Extubation in OR  Informed Consent: I have reviewed the patients History and Physical, chart, labs and discussed the procedure including the risks, benefits and alternatives for the proposed anesthesia with the patient or authorized representative who has indicated his/her understanding and acceptance.   Dental advisory given  Plan Discussed with: CRNA, Surgeon and Anesthesiologist  Anesthesia Plan Comments: (Plan routine monitors, GETA)        Anesthesia Quick Evaluation

## 2013-05-07 NOTE — H&P (Signed)
Marissa [name] is an 37 y.o. female.   Chief Complaint: Back and right leg pain HPI: Patient is a very pleasant 37 year female is a progress worsening back and probably right leg radiating to her hip down the inner part of her right thigh consistent with an L2 nerve root pattern she denies any left leg symptoms as a bowel bladder complaints denies any significant angle down the back of her legs workup has revealed a large disc herniation at L1 to causing severe thecal sac compression compression the right L2 nerve root this correlate with her clinical syndrome and due to her progression of clinical syndrome failed conservative treatment imaging findings have recommended an L1-2 laminectomy discectomy accidents one of the risks and benefits of the operation with the patient as well as perioperative course expectations of outcome of her surgery and she understood and agreed to proceed forward.  Past Medical History  Diagnosis Date  . Hypertension   . Diabetes mellitus without complication   . Coronary artery disease   . Neuropathy   . Headache(784.0)   . Arthritis   . Vision loss     due to diabetes  . Myocardial infarction 2006    "due to medication"; no evidence of ischemia or infarction by nuclear stress test '11    Past Surgical History  Procedure Laterality Date  . Back surgery  2011    Lumbar   . Cervical fusion  2006  . Salpingoophorectomy Bilateral     1 1997. 2nd 2001  . Cardiac catheterization  07/18/2004    50-60% mid LAD, minor luminal irregularities RCA, normal LM and CX, EF 50-55% [facility])    No family history on file. Social History:  reports that she has never smoked. She does not have any smokeless tobacco history on file. She reports that she does not drink alcohol or use illicit drugs.  Allergies:  Allergies  Allergen Reactions  . Ciprocinonide [Fluocinolone] Swelling    lips  . Azithromycin Itching and Rash  . Food Itching and Rash    "Greek  yogurt"    Medications Prior to Admission  Medication Sig Dispense Refill  . aspirin-acetaminophen-caffeine (EXCEDRIN MIGRAINE) 250-250-65 MG per tablet Take 2 tablets by mouth every 6 (six) hours as needed for pain (for migraines).      . carvedilol (COREG) 6.25 MG tablet Take 6.25 mg by mouth 2 (two) times daily with a meal.      . Exenatide ER (BYDUREON) 2 MG PEN Inject 2 mg into the skin once a week. On friday      . gabapentin (NEURONTIN) 300 MG capsule Take 300 mg by mouth 3 (three) times daily.      . hydroxypropyl methylcellulose (ISOPTO TEARS) 2.5 % ophthalmic solution Place 1-2 drops into both eyes 4 (four) times daily as needed (for dry eyes).      . insulin aspart (NOVOLOG FLEXPEN) 100 UNIT/ML SOPN FlexPen Inject 10-20 Units into the skin 3 (three) times daily with meals. Sliding scale      . lisinopril (PRINIVIL,ZESTRIL) 20 MG tablet Take 20 mg by mouth daily.      . metFORMIN (GLUCOPHAGE) 1000 MG tablet Take 1,000 mg by mouth 2 (two) times daily with a meal.      . oxyCODONE (ROXICODONE) 15 MG immediate release tablet Take 15 mg by mouth 4 (four) times daily as needed for pain.      . simvastatin (ZOCOR) 40 MG tablet Take 40 mg by mouth daily.      Marland Kitchen  tiZANidine (ZANAFLEX) 4 MG tablet Take 4 mg by mouth 3 (three) times daily.      . nitroGLYCERIN (NITROSTAT) 0.4 MG SL tablet Place 0.4 mg under the tongue every 5 (five) minutes as needed for chest pain.        Results for orders placed during the hospital encounter of 05/07/13 (from the past 48 hour(s))  GLUCOSE, CAPILLARY     Status: Abnormal   Collection Time    05/07/13  1:34 PM      Result Value Range   Glucose-Capillary 151 (*) 70 - 99 mg/dL  GLUCOSE, CAPILLARY     Status: Abnormal   Collection Time    05/07/13  2:33 PM      Result Value Range   Glucose-Capillary 195 (*) 70 - 99 mg/dL   Dg Chest 2 View  1/61/0960   CLINICAL DATA:  Preop for lumbar laminectomy. History of hypertension and coronary artery disease.   EXAM: CHEST  2 VIEW  COMPARISON:  None.  FINDINGS: The heart size and mediastinal contours are within normal limits. Both lungs are clear.  There are degenerative changes along the thoracic spine. There has been a previous anterior cervical spine fusion.  IMPRESSION: No active cardiopulmonary disease.   Electronically Signed   By: Amie Portland   On: 05/06/2013 13:53    Review of Systems  Constitutional: Negative.   HENT: Negative.   Eyes: Negative.   Respiratory: Negative.   Cardiovascular: Negative.   Gastrointestinal: Negative.   Genitourinary: Negative.   Musculoskeletal: Positive for back pain and joint pain.  Skin: Negative.   Neurological: Positive for tingling.  Endo/Heme/Allergies: Negative.   Psychiatric/Behavioral: Negative.     Blood pressure 170/106, pulse 105, temperature 98.3 F (36.8 C), resp. rate 20, SpO2 100.00%. Physical Exam  Constitutional: She is oriented to person, place, and time. She appears well-developed and well-nourished.  HENT:  Head: Normocephalic.  Eyes: Pupils are equal, round, and reactive to light.  Neck: Normal range of motion.  Cardiovascular: Normal rate.   Respiratory: Effort normal.  GI: Soft.  Musculoskeletal: Normal range of motion.  Neurological: She is alert and oriented to person, place, and time. GCS eye subscore is 4. GCS verbal subscore is 5. GCS motor subscore is 6.  Reflex Scores:      Patellar reflexes are 0 on the right side and 0 on the left side.      Achilles reflexes are 0 on the right side and 0 on the left side. Left lower extremity strength is 5 out of 5 in her iliopsoas quads and she's gastrocs EHL right looks to me has some weakness and iliopsoas quads in 4-4+ out of 5 otherwise she is 5 of 5  Skin: Skin is warm.     Assessment/Plan 37 year female presents for right-sided L1-2 laminectomy microdiscectomy  Aprille Sawhney P 05/07/2013, 2:45 PM

## 2013-05-07 NOTE — Preoperative (Signed)
Beta Blockers   Reason not to administer Beta Blockers:Not Applicable 

## 2013-05-08 ENCOUNTER — Encounter (HOSPITAL_COMMUNITY): Payer: Self-pay | Admitting: Neurosurgery

## 2013-05-08 LAB — GLUCOSE, CAPILLARY
Glucose-Capillary: 352 mg/dL — ABNORMAL HIGH (ref 70–99)
Glucose-Capillary: 247 mg/dL — ABNORMAL HIGH (ref 70–99)

## 2013-05-08 MED ORDER — DOCUSATE SODIUM 100 MG PO CAPS: 100.0000 mg | ORAL_CAPSULE | Freq: Two times a day (BID) | ORAL | Status: DC

## 2013-05-08 MED ORDER — ACETAMINOPHEN 650 MG RE SUPP: 650.0000 mg | RECTAL | Status: DC | PRN

## 2013-05-08 MED ORDER — ALUM & MAG HYDROXIDE-SIMETH 200-200-20 MG/5ML PO SUSP: 30.0000 mL | Freq: Four times a day (QID) | ORAL | Status: DC | PRN

## 2013-05-08 MED ORDER — OXYCODONE-ACETAMINOPHEN 5-325 MG PO TABS: 1.0000 | ORAL_TABLET | ORAL | Status: DC | PRN

## 2013-05-08 MED ORDER — SODIUM CHLORIDE 0.9 % IJ SOLN
3.0000 mL | Freq: Two times a day (BID) | INTRAMUSCULAR | Status: DC
  Administered 2013-05-08: 3 mL via INTRAVENOUS

## 2013-05-08 MED ORDER — CEFAZOLIN SODIUM 1-5 GM-% IV SOLN
1.0000 g | Freq: Three times a day (TID) | INTRAVENOUS | Status: DC
  Administered 2013-05-08: 1 g via INTRAVENOUS
  Filled 2013-05-08 (×2): qty 50

## 2013-05-08 MED ORDER — SODIUM CHLORIDE 0.9 % IJ SOLN: 3.0000 mL | INTRAMUSCULAR | Status: DC | PRN

## 2013-05-08 MED ORDER — ONDANSETRON HCL 4 MG/2ML IJ SOLN: 4.0000 mg | INTRAMUSCULAR | Status: DC | PRN

## 2013-05-08 MED ORDER — ACETAMINOPHEN 325 MG PO TABS: 650.0000 mg | ORAL_TABLET | ORAL | Status: DC | PRN

## 2013-05-08 MED ORDER — INFLUENZA VAC SPLIT QUAD 0.5 ML IM SUSP
0.5000 mL | INTRAMUSCULAR | Status: AC
  Administered 2013-05-08: 0.5 mL via INTRAMUSCULAR
  Filled 2013-05-08: qty 0.5

## 2013-05-08 MED ORDER — PNEUMOCOCCAL VAC POLYVALENT 25 MCG/0.5ML IJ INJ
0.5000 mL | INJECTION | INTRAMUSCULAR | Status: AC
  Administered 2013-05-08: 0.5 mL via INTRAMUSCULAR
  Filled 2013-05-08: qty 0.5

## 2013-05-08 MED ORDER — OXYCODONE HCL 15 MG PO TABS: 15.0000 mg | ORAL_TABLET | Freq: Four times a day (QID) | ORAL | Status: DC | PRN

## 2013-05-08 NOTE — Evaluation (Signed)
Occupational Therapy Evaluation Patient Details Name: Marissa [name] MRN: [id number] DOB: Jul 02, 1976 Today's Date: 05/08/2013 Time: 9147-8295 OT Time Calculation (min): 29 min  OT Assessment / Plan / Recommendation History of present illness s/p L1-2 Laminotomy   Clinical Impression   Pt admitted with above. Will continue to follow acutely in order to address below problem list in prep for return home.    OT Assessment  Patient needs continued OT Services    Follow Up Recommendations  No OT follow up;Supervision/Assistance - 24 hour    Barriers to Discharge      Equipment Recommendations  3 in 1 bedside comode    Recommendations for Other Services    Frequency  Min 2X/week    Precautions / Restrictions Precautions Precautions: Back;Fall Precaution Comments: educated pt on 3/3 back precautions. Restrictions Weight Bearing Restrictions: No   Pertinent Vitals/Pain See vitals    ADL  Eating/Feeding: Performed;Independent Where Assessed - Eating/Feeding: Edge of bed Grooming: Performed;Brushing hair;Set up Where Assessed - Grooming: Unsupported sitting Upper Body Bathing: Simulated;Set up Where Assessed - Upper Body Bathing: Unsupported sitting Lower Body Bathing: Simulated;Minimal assistance Where Assessed - Lower Body Bathing: Unsupported sit to stand Upper Body Dressing: Performed;Set up Where Assessed - Upper Body Dressing: Unsupported sitting Lower Body Dressing: Performed;Maximal assistance Where Assessed - Lower Body Dressing: Unsupported sit to stand Toilet Transfer: Simulated;Min guard Toilet Transfer Method: Sit to Barista:  (bed) Equipment Used: Rolling walker Transfers/Ambulation Related to ADLs: min guard with RW ADL Comments: Educated pt on back precautions. Pt unable to cross ankles over knees for LB tasks but states she was able to prior to sx. States her aunt will assist with ADLs as needed.    OT Diagnosis: Acute pain  OT  Problem List: Decreased activity tolerance;Pain;Decreased knowledge of use of DME or AE;Decreased knowledge of precautions OT Treatment Interventions: Self-care/ADL training;DME and/or AE instruction;Therapeutic activities;Patient/family education;Balance training   OT Goals(Current goals can be found in the care plan section) Acute Rehab OT Goals Patient Stated Goal: To go home today OT Goal Formulation: With patient Time For Goal Achievement: 05/15/13 Potential to Achieve Goals: Good  Visit Information  Last OT Received On: 05/08/13 Assistance Needed: +1 History of Present Illness: s/p L1-2 Laminotomy       Prior Functioning     Home Living Family/patient expects to be discharged to:: Private residence Living Arrangements: Spouse/significant other;Other relatives Available Help at Discharge: Family;Available PRN/intermittently Type of Home: House Home Access: Stairs to enter [employer] of Steps: 3 Entrance Stairs-Rails: None Home Layout: One level Home Equipment: Environmental consultant - 2 wheels Prior Function Level of Independence: Independent Communication Communication: No difficulties Dominant Hand: Right         Vision/Perception     Cognition  Cognition Arousal/Alertness: Awake/alert Behavior During Therapy: WFL for tasks assessed/performed Overall Cognitive Status: Within Functional Limits for tasks assessed    Extremity/Trunk Assessment Upper Extremity Assessment Upper Extremity Assessment: Overall WFL for tasks assessed Lower Extremity Assessment Lower Extremity Assessment: Generalized weakness     Mobility Bed Mobility Bed Mobility: Rolling Right;Right Sidelying to Sit;Sitting - Scoot to [place] of Bed;Sit to Sidelying Right Rolling Right: 5: Supervision Right Sidelying to Sit: 5: Supervision Sitting - Scoot to Edge of Bed: 5: Supervision Sit to Sidelying Right: 5: Supervision Details for Bed Mobility Assistance: Minguard for safety with cues for  proper technique to prevent twisting Transfers Transfers: Sit to Stand;Stand to Sit Sit to Stand: From bed;4: Min guard Stand to Sit: To  bed;4: Min guard Details for Transfer Assistance: min guard for safety     Exercise     Balance     End of Session OT - End of Session Equipment Utilized During Treatment: Gait belt;Rolling walker Activity Tolerance: Patient tolerated treatment well Patient left: in bed;with call bell/phone within reach;with family/visitor present Nurse Communication: Mobility status  GO Functional Assessment Tool Used: clinical judgement Functional Limitation: Self care Self Care Current Status (Z6109): At least 20 percent but less than 40 percent impaired, limited or restricted Self Care Goal Status (U0454): At least 1 percent but less than 20 percent impaired, limited or restricted   05/08/2013 Cipriano Mile OTR/L Pager [phone] Office [phone]  Cipriano Mile 05/08/2013, 4:06 PM

## 2013-05-08 NOTE — Evaluation (Addendum)
Physical Therapy Evaluation Patient Details Name: Marissa [name] MRN: [id number] DOB: Jan 09, 1976 Today's Date: 05/08/2013 Time: 7829-5621 PT Time Calculation (min): 27 min  PT Assessment / Plan / Recommendation History of Present Illness  s/p L1-2 Laminotomy  Clinical Impression  Pt admitted with the above. Pt currently with functional limitations due to the deficits listed below (see PT Problem List). Pt limited due to pain and very slow to move and needs extra time to complete task.  Pt will benefit from skilled PT to increase their independence and safety with mobility to allow discharge to the venue listed below.     PT Assessment  Patient needs continued PT services    Follow Up Recommendations  Supervision/Assistance - 24 hour;No PT follow up    Equipment Recommendations  3in1 (PT)    Frequency Min 5X/week    Precautions / Restrictions Precautions Precautions: Back;Fall Precaution Comments: Reeducated on 3 back precautions Restrictions Weight Bearing Restrictions: No   Pertinent Vitals/Pain 5/10 back pain; pt request RN for pain  meds at end of session      Mobility  Bed Mobility Bed Mobility: Rolling Right;Right Sidelying to Sit;Sit to Sidelying Right Rolling Right: 5: Supervision Right Sidelying to Sit: 5: Supervision Sit to Sidelying Right: 4: Min guard;HOB flat Details for Bed Mobility Assistance: Minguard for safety with cues for proper technique to prevent twisting Transfers Transfers: Sit to Stand;Stand to Sit Sit to Stand: 4: Min guard;From bed;Other (comment) (w/c) Stand to Sit: 4: Min guard;To bed (w/c) Details for Transfer Assistance: Minguard for safety with cues for hand placement Ambulation/Gait Ambulation/Gait Assistance: 4: Min guard Ambulation Distance (Feet): 125 Feet Assistive device: Rolling walker Ambulation/Gait Assistance Details: Minguard for safety with cues for upright posture and increase step length and overall step clearance Gait  Pattern: Step-through pattern;Decreased stride length;Shuffle;Trunk flexed;Decreased trunk rotation (slight trunk flexion) Gait velocity: decreased due to pain General Gait Details: Pt with increase pain and weakness therefore needed w/c to sit down and transport back into room.  Stairs: Yes Stairs Assistance: 4: Min assist Stairs Assistance Details (indicate cue type and reason): (A) to maintain balance and prevent left knee buckling Stair Management Technique: One rail Left;Step to pattern;Forwards Number of Stairs: 3    Exercises     PT Diagnosis: Difficulty walking;Generalized weakness;Acute pain  PT Problem List: Decreased activity tolerance;Decreased balance;Decreased mobility;Decreased knowledge of use of DME;Decreased knowledge of precautions;Pain PT Treatment Interventions: DME instruction;Gait training;Stair training;Functional mobility training;Therapeutic activities;Therapeutic exercise;Balance training;Cognitive remediation;Patient/family education     PT Goals(Current goals can be found in the care plan section) Acute Rehab PT Goals Patient Stated Goal: To go home today PT Goal Formulation: With patient/family Time For Goal Achievement: 05/15/13 Potential to Achieve Goals: Good  Visit Information  Last PT Received On: 05/08/13 Assistance Needed: +1 History of Present Illness: s/p L1-2 Laminotomy       Prior Functioning  Home Living Family/patient expects to be discharged to:: Private residence Living Arrangements: Spouse/significant other;Other relatives Available Help at Discharge: Family;Available PRN/intermittently Type of Home: House Home Access: Stairs to enter [employer] of Steps: 3 Entrance Stairs-Rails: None Home Layout: One level Home Equipment: Environmental consultant - 2 wheels Prior Function Level of Independence: Independent Communication Communication: No difficulties Dominant Hand: Right    Cognition  Cognition Arousal/Alertness:  Awake/alert Behavior During Therapy: WFL for tasks assessed/performed Overall Cognitive Status: Within Functional Limits for tasks assessed    Extremity/Trunk Assessment Lower Extremity Assessment Lower Extremity Assessment: Generalized weakness   Balance    End  of Session PT - End of Session Equipment Utilized During Treatment: Gait belt Activity Tolerance: Patient limited by pain;Patient limited by fatigue Patient left: in bed;with call bell/phone within reach Nurse Communication: Mobility status  GP     Marissa [name] 05/08/2013, 2:04 PM  Jake Shark, PT DPT [phone]

## 2013-05-08 NOTE — Progress Notes (Signed)
No issues overnight. Pt reports significant operative site back pain and soreness. Is ambulatory, voiding without difficulty.  EXAM:  BP 110/73  Pulse 86  Temp(Src) 97.7 F (36.5 C) (Oral)  Resp 16  SpO2 96%  Awake, alert, oriented  Speech fluent, appropriate  CN grossly intact  5/5 BUE/BLE, with giveaway weakness in BLE  Wound c/d/i  IMPRESSION:  37 y.o. female POD# 1 s/p right L1-2 laminotomy, discectomy. Neurologically at baseline  PLAN: - PT eval - Cont to ambulate OOB, pain control - Likely D/C home tomorrow

## 2013-05-08 NOTE — Discharge Summary (Signed)
Physician Discharge Summary  Patient ID: Marissa [name] MRN: [id number] DOB/AGE: [birth year] 37 y.o.  Admit date: 05/07/2013 Discharge date: 05/08/2013  Admission Diagnoses: Herniated nucleus pulposis, L1-2  Discharge Diagnoses: Same Active Problems:   * No active hospital problems. *   Discharged Condition: Stable  Hospital Course: Mrs. Marissa [name] is a 37 y.o. woman seen in the outpatient clinic with signs/symptoms c/w L2 radiculopathy. MRI demonstrated L1-2 disc herniation. She was electively taken for L1-2 laminotomy and microdiscectomy which was done without complication. She was observed overnight on the spine unit and found on POD#1 to be at her neurologic baseline with good strength in the BLE, tolerating diet, ambulating, and voiding without difficulty. She was therefore deemed stable for d/c.  Treatments: Surgery - L1-2 laminotomy, microdiscectomy  Discharge Exam: Blood pressure 142/96, pulse 89, temperature 98 F (36.7 C), temperature source Oral, resp. rate 18, SpO2 96.00%. Awake, alert, oriented Speech fluent, appropriate CN grossly intact 5/5 BUE/BLE Wound c/d/i  Disposition: Home  Discharge Orders   Future Orders Complete By Expires   Discharge patient  As directed        Medication List         aspirin-acetaminophen-caffeine 250-250-65 MG per tablet  Commonly known as:  EXCEDRIN MIGRAINE  Take 2 tablets by mouth every 6 (six) hours as needed for pain (for migraines).     BYDUREON 2 MG Pen  Generic drug:  Exenatide ER  Inject 2 mg into the skin once a week. On friday     carvedilol 6.25 MG tablet  Commonly known as:  COREG  Take 6.25 mg by mouth 2 (two) times daily with a meal.     gabapentin 300 MG capsule  Commonly known as:  NEURONTIN  Take 300 mg by mouth 3 (three) times daily.     hydroxypropyl methylcellulose 2.5 % ophthalmic solution  Commonly known as:  ISOPTO TEARS  Place 1-2 drops into both eyes 4 (four) times daily as needed (for  dry eyes).     lisinopril 20 MG tablet  Commonly known as:  PRINIVIL,ZESTRIL  Take 20 mg by mouth daily.     metFORMIN 1000 MG tablet  Commonly known as:  GLUCOPHAGE  Take 1,000 mg by mouth 2 (two) times daily with a meal.     nitroGLYCERIN 0.4 MG SL tablet  Commonly known as:  NITROSTAT  Place 0.4 mg under the tongue every 5 (five) minutes as needed for chest pain.     NOVOLOG FLEXPEN 100 UNIT/ML Sopn FlexPen  Generic drug:  insulin aspart  Inject 10-20 Units into the skin 3 (three) times daily with meals. Sliding scale     oxyCODONE 15 MG immediate release tablet  Commonly known as:  ROXICODONE  Take 1 tablet (15 mg total) by mouth 4 (four) times daily as needed.     oxyCODONE 15 MG immediate release tablet  Commonly known as:  ROXICODONE  Take 15 mg by mouth 4 (four) times daily as needed for pain.     oxyCODONE-acetaminophen 5-325 MG per tablet  Commonly known as:  PERCOCET/ROXICET  Take 1-2 tablets by mouth every 4 (four) hours as needed.     simvastatin 40 MG tablet  Commonly known as:  ZOCOR  Take 40 mg by mouth daily.     tiZANidine 4 MG tablet  Commonly known as:  ZANAFLEX  Take 4 mg by mouth 3 (three) times daily.         SignedLisbeth Renshaw, C 05/08/2013, 12:45  PM

## 2013-05-08 NOTE — Progress Notes (Signed)
Pt. Alert and oriented,follows simple instructions, Incision area without swelling, redness or S/S of infection. Voiding adequate clear yellow urine. Moving all extremities well and vitals stable and documented. Lumbar surgery notes instructions given to patient and family member for home safety and precautions. Pt. and family stated understanding of instructions given.

## 2013-05-09 ENCOUNTER — Encounter (HOSPITAL_COMMUNITY): Payer: Self-pay | Admitting: *Deleted

## 2013-05-09 ENCOUNTER — Emergency Department (HOSPITAL_COMMUNITY)
Admission: EM | Admit: 2013-05-09 | Discharge: 2013-05-10 | Disposition: A | Discharge status: Home / Self Care | Payer: Medicare Other | Attending: Emergency Medicine | Admitting: Emergency Medicine

## 2013-05-09 ENCOUNTER — Emergency Department (HOSPITAL_COMMUNITY): Payer: Medicare Other

## 2013-05-09 DIAGNOSIS — I2510 Atherosclerotic heart disease of native coronary artery without angina pectoris: Secondary | ICD-10-CM | POA: Insufficient documentation

## 2013-05-09 DIAGNOSIS — Z8669 Personal history of other diseases of the nervous system and sense organs: Secondary | ICD-10-CM | POA: Insufficient documentation

## 2013-05-09 DIAGNOSIS — I10 Essential (primary) hypertension: Secondary | ICD-10-CM | POA: Insufficient documentation

## 2013-05-09 DIAGNOSIS — M549 Dorsalgia, unspecified: Secondary | ICD-10-CM | POA: Insufficient documentation

## 2013-05-09 DIAGNOSIS — Z794 Long term (current) use of insulin: Secondary | ICD-10-CM | POA: Insufficient documentation

## 2013-05-09 DIAGNOSIS — M129 Arthropathy, unspecified: Secondary | ICD-10-CM | POA: Insufficient documentation

## 2013-05-09 DIAGNOSIS — R51 Headache: Secondary | ICD-10-CM | POA: Insufficient documentation

## 2013-05-09 DIAGNOSIS — R5082 Postprocedural fever: Secondary | ICD-10-CM | POA: Insufficient documentation

## 2013-05-09 DIAGNOSIS — H547 Unspecified visual loss: Secondary | ICD-10-CM | POA: Insufficient documentation

## 2013-05-09 DIAGNOSIS — I252 Old myocardial infarction: Secondary | ICD-10-CM | POA: Insufficient documentation

## 2013-05-09 DIAGNOSIS — E119 Type 2 diabetes mellitus without complications: Secondary | ICD-10-CM | POA: Insufficient documentation

## 2013-05-09 DIAGNOSIS — G8918 Other acute postprocedural pain: Secondary | ICD-10-CM | POA: Insufficient documentation

## 2013-05-09 DIAGNOSIS — Z9889 Other specified postprocedural states: Secondary | ICD-10-CM | POA: Insufficient documentation

## 2013-05-09 DIAGNOSIS — Z9861 Coronary angioplasty status: Secondary | ICD-10-CM | POA: Insufficient documentation

## 2013-05-09 DIAGNOSIS — Z7982 Long term (current) use of aspirin: Secondary | ICD-10-CM | POA: Insufficient documentation

## 2013-05-09 DIAGNOSIS — Z79899 Other long term (current) drug therapy: Secondary | ICD-10-CM | POA: Insufficient documentation

## 2013-05-09 LAB — CBC WITH DIFFERENTIAL/PLATELET
Basophils Absolute: 0 10*3/uL (ref 0.0–0.1)
Basophils Relative: 0 % (ref 0–1)
Eosinophils Absolute: 0 10*3/uL (ref 0.0–0.7)
Eosinophils Relative: 0 % (ref 0–5)
HCT: 29.8 % — ABNORMAL LOW (ref 36.0–46.0)
MCHC: 33.9 g/dL (ref 30.0–36.0)
MCV: 87.6 fL (ref 78.0–100.0)
Monocytes Absolute: 0.8 10*3/uL (ref 0.1–1.0)
Neutro Abs: 6.8 10*3/uL (ref 1.7–7.7)
Neutrophils Relative %: 74 % (ref 43–77)
RDW: 14 % (ref 11.5–15.5)

## 2013-05-09 LAB — URINALYSIS, ROUTINE W REFLEX MICROSCOPIC
Glucose, UA: >1000 mg/dL — AB
Hgb urine dipstick: NEGATIVE
Ketones, ur: NEGATIVE mg/dL
Leukocytes, UA: NEGATIVE
Nitrite: NEGATIVE
Specific Gravity, Urine: 1.026 (ref 1.005–1.030)
Urobilinogen, UA: 0.2 mg/dL (ref 0.0–1.0)
pH: 5.5 (ref 5.0–8.0)

## 2013-05-09 LAB — COMPREHENSIVE METABOLIC PANEL
AST: 13 U/L (ref 0–37)
Albumin: 2.7 g/dL — ABNORMAL LOW (ref 3.5–5.2)
Calcium: 8.5 mg/dL (ref 8.4–10.5)
Chloride: 102 mEq/L (ref 96–112)
Creatinine, Ser: 0.61 mg/dL (ref 0.50–1.10)
Total Protein: 6.2 g/dL (ref 6.0–8.3)

## 2013-05-09 LAB — URINE MICROSCOPIC-ADD ON

## 2013-05-09 MED ORDER — ONDANSETRON HCL 4 MG/2ML IJ SOLN
4.0000 mg | Freq: Once | INTRAMUSCULAR | Status: AC
  Administered 2013-05-09: 4 mg via INTRAVENOUS
  Filled 2013-05-09: qty 2

## 2013-05-09 MED ORDER — SODIUM CHLORIDE 0.9 % IV BOLUS (SEPSIS)
1000.0000 mL | Freq: Once | INTRAVENOUS | Status: AC
  Administered 2013-05-09: 1000 mL via INTRAVENOUS

## 2013-05-09 MED ORDER — HYDROMORPHONE HCL PF 1 MG/ML IJ SOLN
1.0000 mg | Freq: Once | INTRAMUSCULAR | Status: AC
  Administered 2013-05-09: 1 mg via INTRAVENOUS
  Filled 2013-05-09: qty 1

## 2013-05-09 NOTE — ED Provider Notes (Signed)
CSN: [id number]     Arrival date & time 05/09/13  1756 History   First MD Initiated Contact with Patient 05/09/13 2209     Chief Complaint  Patient presents with  . Fever   (Consider location/radiation/quality/duration/timing/severity/associated sxs/prior Treatment) HPI  Marissa [name] is a 37 y.o. female with PMH significant for IDDM, CAD and HTN status post L1-2 spine laminectomy and microdiscectomy surgery performed two days ago by Dr.  Wynetta Emery, presents with fever for the past two days.   Patient was instructed by Dr.  Lonie Peak nurse to come to the ED for evaluation.  Temperature at home was 101, patient didn't take anything to relieve her fever.  Patient complains of post-surgical pain at her lower back, and states Percocet 15mg  q6h is not helping to control her pain, this is the dose that she was on prior to surgery.  Patient denies any chest pain, cough, sob, calf swelling or pain, abdominal pain, urinary or bowel change. Pt reports a single episode of nonbloody, nonbilious, coffee-ground emesis this a.m. She is tolerating by mouth since then.  Past Medical History  Diagnosis Date  . Hypertension   . Diabetes mellitus without complication   . Coronary artery disease   . Neuropathy   . Headache(784.0)   . Arthritis   . Vision loss     due to diabetes  . Myocardial infarction 2006    "due to medication"; no evidence of ischemia or infarction by nuclear stress test '11   Past Surgical History  Procedure Laterality Date  . Back surgery  2011    Lumbar   . Cervical fusion  2006  . Salpingoophorectomy Bilateral     1 1997. 2nd 2001  . Cardiac catheterization  07/18/2004    50-60% mid LAD, minor luminal irregularities RCA, normal LM and CX, EF 50-55% [facility])  . Lumbar laminectomy/decompression microdiscectomy Right 05/07/2013    Procedure: Right Lumbar one-two laminectomy;  Surgeon: Mariam Dollar, MD;  Location: [facility];  Service: Neurosurgery;  Laterality: Right;    No family history on file. History  Substance Use Topics  . Smoking status: Never Smoker   . Smokeless tobacco: Not on file  . Alcohol Use: No   OB History   Grav Para Term Preterm Abortions TAB SAB Ect Mult Living                 Review of Systems 10 systems reviewed and found to be negative, except as noted in the HPI  Allergies  Ciprocinonide; Azithromycin; and Food  Home Medications   Current Outpatient Rx  Name  Route  Sig  Dispense  Refill  . aspirin-acetaminophen-caffeine (EXCEDRIN MIGRAINE) 250-250-65 MG per tablet   Oral   Take 2 tablets by mouth every 6 (six) hours as needed for pain (for migraines).         . carvedilol (COREG) 6.25 MG tablet   Oral   Take 6.25 mg by mouth 2 (two) times daily with a meal.         . Exenatide ER (BYDUREON) 2 MG PEN   Subcutaneous   Inject 2 mg into the skin once a week. On friday         . gabapentin (NEURONTIN) 300 MG capsule   Oral   Take 300 mg by mouth 3 (three) times daily.         . hydroxypropyl methylcellulose (ISOPTO TEARS) 2.5 % ophthalmic solution   Both Eyes   Place 1-2 drops  into both eyes 4 (four) times daily as needed (for dry eyes).         . insulin aspart (NOVOLOG FLEXPEN) 100 UNIT/ML SOPN FlexPen   Subcutaneous   Inject 10-20 Units into the skin 3 (three) times daily with meals. Sliding scale         . lisinopril (PRINIVIL,ZESTRIL) 20 MG tablet   Oral   Take 20 mg by mouth daily.         . metFORMIN (GLUCOPHAGE) 1000 MG tablet   Oral   Take 1,000 mg by mouth 2 (two) times daily with a meal.         . oxyCODONE (ROXICODONE) 15 MG immediate release tablet   Oral   Take 1 tablet (15 mg total) by mouth 4 (four) times daily as needed.   30 tablet   0   . oxyCODONE-acetaminophen (PERCOCET/ROXICET) 5-325 MG per tablet   Oral   Take 1-2 tablets by mouth every 4 (four) hours as needed.   30 tablet   0   . simvastatin (ZOCOR) 40 MG tablet   Oral   Take 40 mg by mouth daily.          Marland Kitchen tiZANidine (ZANAFLEX) 4 MG tablet   Oral   Take 4 mg by mouth 3 (three) times daily.         . nitroGLYCERIN (NITROSTAT) 0.4 MG SL tablet   Sublingual   Place 0.4 mg under the tongue every 5 (five) minutes as needed for chest pain.          BP 146/82  Pulse 102  Temp(Src) 98.2 F (36.8 C) (Oral)  Resp 14  SpO2 96% Physical Exam  Nursing note and vitals reviewed. Constitutional: She is oriented to person, place, and time. She appears well-developed and well-nourished. No distress.  HENT:  Head: Normocephalic and atraumatic.  Mouth/Throat: Oropharynx is clear and moist.  Controlling her secretions, no elevation, induration under tongue, no trismus.  Eyes: Conjunctivae and EOM are normal. Pupils are equal, round, and reactive to light.  Neck: Normal range of motion. Neck supple.  Cardiovascular: Normal rate, regular rhythm, normal heart sounds and intact distal pulses.   Pulmonary/Chest: Effort normal and breath sounds normal. No stridor. No respiratory distress. She has no wheezes. She has no rales. She exhibits no tenderness.  Abdominal: Soft. She exhibits no mass. There is no tenderness. There is no rebound and no guarding.  Musculoskeletal: Normal range of motion. She exhibits no edema.       Back:  No calf asymmetry, superficial collaterals, palpable cords, edema, Homans sign negative bilaterally.    Neurological: She is alert and oriented to person, place, and time.  Strength is 5 out of 5-4 extremities, distal sensation is grossly intact.  Psychiatric: She has a normal mood and affect.    ED Course  Procedures (including critical care time) Labs Review Labs Reviewed  CBC WITH DIFFERENTIAL - Abnormal; Notable for the following:    RBC 3.40 (*)    Hemoglobin 10.1 (*)    HCT 29.8 (*)    All other components within normal limits  COMPREHENSIVE METABOLIC PANEL - Abnormal; Notable for the following:    Glucose, Bld 265 (*)    Albumin 2.7 (*)    All other  components within normal limits  URINALYSIS, ROUTINE W REFLEX MICROSCOPIC - Abnormal; Notable for the following:    APPearance HAZY (*)    Glucose, UA >1000 (*)    All other components within  normal limits  URINE MICROSCOPIC-ADD ON - Abnormal; Notable for the following:    Squamous Epithelial / LPF FEW (*)    Bacteria, UA FEW (*)    All other components within normal limits   Imaging Review Dg Chest Portable 1 View  05/10/2013   *RADIOLOGY REPORT*  Clinical Data: 37 year old female with fever.  Recent lumbar surgery.  PORTABLE CHEST - 1 VIEW  Comparison: 05/06/2013 chest radiograph  Findings: This is a low volume film with mild bibasilar atelectasis. The cardiomediastinal silhouette is unremarkable. There is no evidence of focal airspace disease, pulmonary edema, suspicious pulmonary nodule/mass, pleural effusion, or pneumothorax. No acute bony abnormalities are identified. Cervical spinal hardware again noted.  IMPRESSION: Low volume film with mild bibasilar atelectasis.   Original Report Authenticated By: Harmon Pier, M.D.    MDM   1. Postoperative fever   2. Back pain     Filed Vitals:   05/10/13 0007 05/10/13 0015 05/10/13 0030 05/10/13 0045  BP:  123/73 120/66 130/76  Pulse:  99 96 99  Temp: 98.5 F (36.9 C)     TempSrc: Oral     Resp:      SpO2:  94% 95% 99%     Marissa [name] is a 37 y.o. female with postop fever of 101.0 prior to arrival, status post laminectomy 2 days ago by Dr. Wynetta Emery. Surgical site does not appear to be infected, lung sounds are clear to auscultation chest x-ray shows no infiltrate, urinalysis is not consistent with infection. Patient is not short of breath and shows no clinical signs of DVT. Pain is well-controlled in the ED with Dilaudid.  Neurosurgery consult from Dr. Jeral Fruit appreciated: He believes the patient to be appropriate for discharge at this time with no further workup. She is to followup with them in the office next week. I've given her and her  on specific return precautions. I will increase her Roxicet dosage from 15-30 mg.   Medications  sodium chloride 0.9 % bolus 1,000 mL (0 mLs Intravenous Stopped 05/10/13 0005)  HYDROmorphone (DILAUDID) injection 1 mg (1 mg Intravenous Given 05/09/13 2309)  ondansetron (ZOFRAN) injection 4 mg (4 mg Intravenous Given 05/09/13 2309)  HYDROmorphone (DILAUDID) injection 1 mg (1 mg Intravenous Given 05/10/13 0048)    Pt is hemodynamically stable, appropriate for, and amenable to discharge at this time. Pt verbalized understanding and agrees with care plan. All questions answered. Outpatient follow-up and specific return precautions discussed.    New Prescriptions   OXYCODONE (ROXICODONE) 30 MG IMMEDIATE RELEASE TABLET    Take 1 tablet (30 mg total) by mouth every 4 (four) hours as needed for pain.   PROMETHAZINE (PHENERGAN) 25 MG TABLET    Take 1 tablet (25 mg total) by mouth every 6 (six) hours as needed for nausea.    Note: Portions of this report may have been transcribed using voice recognition software. Every effort was made to ensure accuracy; however, inadvertent computerized transcription errors may be present      Wynetta Emery, PA-C 05/10/13 2055  Wynetta Emery, PA-C 05/10/13 2339

## 2013-05-09 NOTE — ED Notes (Signed)
The pt is c/o an elevated temp and increasing back pain .  She had surgery on her lower back this past Wednesday.  Unable to see the incision

## 2013-05-09 NOTE — ED Notes (Signed)
Nurse First Round : Nurse explained delay , process and wait time to pt. Respirations unlabored .

## 2013-05-09 NOTE — ED Notes (Signed)
Pt stated had back surgery two day ago,  Started having pain and fever last night. Talk to PCP was advice to come to Ed because of the fever. Stated temp at home was 101.5 and temp now is 99.8.

## 2013-05-10 MED ORDER — HYDROMORPHONE HCL PF 1 MG/ML IJ SOLN
1.0000 mg | Freq: Once | INTRAMUSCULAR | Status: AC
  Administered 2013-05-10: 1 mg via INTRAVENOUS
  Filled 2013-05-10: qty 1

## 2013-05-10 MED ORDER — OXYCODONE HCL 30 MG PO TABS: 30.0000 mg | ORAL_TABLET | ORAL | Status: DC | PRN

## 2013-05-10 MED ORDER — PROMETHAZINE HCL 25 MG PO TABS: 25.0000 mg | ORAL_TABLET | Freq: Four times a day (QID) | ORAL | Status: DC | PRN

## 2013-05-10 MED ORDER — LORAZEPAM 2 MG/ML IJ SOLN: 1.0000 mg | Freq: Once | INTRAMUSCULAR | Status: DC

## 2013-05-10 MED ORDER — HYDROMORPHONE HCL PF 1 MG/ML IJ SOLN: 1.0000 mg | Freq: Once | INTRAMUSCULAR | Status: DC

## 2013-05-10 NOTE — ED Notes (Signed)
Reports: "feel better", pain 7/10, (denies: nausea or sob), admits to intermittant HA & leg pain (none now) and numbness/ tingling, L>R. MAEx4, BLE weak ("d/t pain").

## 2013-05-10 NOTE — ED Notes (Signed)
Xray finished at BS 

## 2013-05-11 NOTE — ED Provider Notes (Signed)
Medical screening examination/treatment/procedure(s) were performed by non-physician practitioner and as supervising physician I was immediately available for consultation/collaboration.   Richardean Canal, MD 05/11/13 [phone]

## 2013-05-22 ENCOUNTER — Ambulatory Visit: Payer: Self-pay | Admitting: Pain Medicine

## 2013-06-03 ENCOUNTER — Emergency Department: Payer: Self-pay | Admitting: Emergency Medicine

## 2013-06-04 ENCOUNTER — Ambulatory Visit: Payer: Self-pay | Admitting: Pain Medicine

## 2013-06-17 ENCOUNTER — Ambulatory Visit: Payer: Self-pay | Admitting: Pain Medicine

## 2013-07-02 ENCOUNTER — Ambulatory Visit: Payer: Self-pay | Admitting: Pain Medicine

## 2013-07-22 ENCOUNTER — Ambulatory Visit: Payer: Self-pay | Admitting: Pain Medicine

## 2013-08-01 ENCOUNTER — Ambulatory Visit: Payer: Self-pay | Admitting: Neurosurgery

## 2013-08-14 DIAGNOSIS — J189 Pneumonia, unspecified organism: Secondary | ICD-10-CM

## 2013-08-14 HISTORY — DX: Pneumonia, unspecified organism: J18.9

## 2013-08-21 ENCOUNTER — Ambulatory Visit: Payer: Self-pay | Admitting: Pain Medicine

## 2013-09-01 ENCOUNTER — Emergency Department: Payer: Self-pay | Admitting: Emergency Medicine

## 2013-09-10 ENCOUNTER — Emergency Department: Payer: Self-pay | Admitting: Internal Medicine

## 2013-09-10 ENCOUNTER — Ambulatory Visit: Payer: Self-pay | Admitting: Pain Medicine

## 2013-09-10 LAB — CBC WITH DIFFERENTIAL/PLATELET
Basophil #: 0 10*3/uL (ref 0.0–0.1)
Basophil %: 0.7 %
Eosinophil #: 0.1 10*3/uL (ref 0.0–0.7)
Eosinophil %: 1.8 %
HCT: 36.8 % (ref 35.0–47.0)
HGB: 12.1 g/dL (ref 12.0–16.0)
Lymphocyte #: 1 10*3/uL (ref 1.0–3.6)
Lymphocyte %: 15.8 %
MCH: 28.4 pg (ref 26.0–34.0)
MCHC: 33 g/dL (ref 32.0–36.0)
MCV: 86 fL (ref 80–100)
Monocyte #: 0.4 x10 3/mm (ref 0.2–0.9)
Monocyte %: 6.2 %
Neutrophil #: 4.9 10*3/uL (ref 1.4–6.5)
Neutrophil %: 75.5 %
Platelet: 141 10*3/uL — ABNORMAL LOW (ref 150–440)
RBC: 4.27 10*6/uL (ref 3.80–5.20)
RDW: 15.4 % — ABNORMAL HIGH (ref 11.5–14.5)
WBC: 6.5 10*3/uL (ref 3.6–11.0)

## 2013-09-10 LAB — BASIC METABOLIC PANEL
ANION GAP: 3 — AB (ref 7–16)
BUN: 12 mg/dL (ref 7–18)
CHLORIDE: 106 mmol/L (ref 98–107)
Calcium, Total: 8.8 mg/dL (ref 8.5–10.1)
Co2: 28 mmol/L (ref 21–32)
Creatinine: 0.72 mg/dL (ref 0.60–1.30)
EGFR (African American): >60
Glucose: 222 mg/dL — ABNORMAL HIGH (ref 65–99)
Osmolality: 280 (ref 275–301)
Potassium: 4.6 mmol/L (ref 3.5–5.1)
Sodium: 137 mmol/L (ref 136–145)

## 2013-09-11 ENCOUNTER — Other Ambulatory Visit: Payer: Self-pay | Admitting: Neurosurgery

## 2013-09-17 ENCOUNTER — Emergency Department: Payer: Self-pay | Admitting: Emergency Medicine

## 2013-09-17 ENCOUNTER — Ambulatory Visit: Payer: Self-pay | Admitting: Pain Medicine

## 2013-09-17 LAB — COMPREHENSIVE METABOLIC PANEL
ALK PHOS: 106 U/L
ANION GAP: 8 (ref 7–16)
Albumin: 3.8 g/dL (ref 3.4–5.0)
BUN: 13 mg/dL (ref 7–18)
Bilirubin,Total: 1.6 mg/dL — ABNORMAL HIGH (ref 0.2–1.0)
Calcium, Total: 10.1 mg/dL (ref 8.5–10.1)
Chloride: 96 mmol/L — ABNORMAL LOW (ref 98–107)
Co2: 26 mmol/L (ref 21–32)
Creatinine: 0.82 mg/dL (ref 0.60–1.30)
EGFR (Non-African Amer.): >60
Glucose: 285 mg/dL — ABNORMAL HIGH (ref 65–99)
OSMOLALITY: 271 (ref 275–301)
Potassium: 4.5 mmol/L (ref 3.5–5.1)
SGOT(AST): 17 U/L (ref 15–37)
SGPT (ALT): 31 U/L (ref 12–78)
SODIUM: 130 mmol/L — AB (ref 136–145)
Total Protein: 8.7 g/dL — ABNORMAL HIGH (ref 6.4–8.2)

## 2013-09-17 LAB — CBC
HCT: 44.6 % (ref 35.0–47.0)
HGB: 14.9 g/dL (ref 12.0–16.0)
MCH: 28.5 pg (ref 26.0–34.0)
MCHC: 33.4 g/dL (ref 32.0–36.0)
MCV: 86 fL (ref 80–100)
Platelet: 190 10*3/uL (ref 150–440)
RBC: 5.21 10*6/uL — ABNORMAL HIGH (ref 3.80–5.20)
RDW: 15.2 % — AB (ref 11.5–14.5)
WBC: 9.3 10*3/uL (ref 3.6–11.0)

## 2013-09-17 LAB — PRO B NATRIURETIC PEPTIDE: B-Type Natriuretic Peptide: 59 pg/mL (ref 0–125)

## 2013-09-17 LAB — TROPONIN I: Troponin-I: <0.02 ng/mL

## 2013-09-17 LAB — LIPASE, BLOOD: Lipase: 115 U/L (ref 73–393)

## 2013-09-18 ENCOUNTER — Emergency Department: Payer: Self-pay | Admitting: Emergency Medicine

## 2013-09-19 ENCOUNTER — Encounter: Payer: Medicare Other | Admitting: Internal Medicine

## 2013-10-03 ENCOUNTER — Encounter: Payer: Medicare Other | Admitting: Internal Medicine

## 2013-10-13 ENCOUNTER — Encounter (HOSPITAL_COMMUNITY): Payer: Self-pay | Admitting: Pharmacy Technician

## 2013-10-16 ENCOUNTER — Ambulatory Visit: Payer: Self-pay | Admitting: Pain Medicine

## 2013-10-20 ENCOUNTER — Encounter (HOSPITAL_COMMUNITY)
Admission: RE | Admit: 2013-10-20 | Discharge: 2013-10-20 | Disposition: A | Discharge status: Home / Self Care | Payer: Medicare HMO | Source: Ambulatory Visit | Attending: Neurosurgery | Admitting: Neurosurgery

## 2013-10-20 ENCOUNTER — Encounter (HOSPITAL_COMMUNITY): Payer: Self-pay

## 2013-10-20 ENCOUNTER — Other Ambulatory Visit (HOSPITAL_COMMUNITY): Payer: Medicare Other

## 2013-10-20 DIAGNOSIS — Z01812 Encounter for preprocedural laboratory examination: Secondary | ICD-10-CM | POA: Insufficient documentation

## 2013-10-20 HISTORY — DX: Cardiac arrhythmia, unspecified: I49.9

## 2013-10-20 HISTORY — DX: Migraine, unspecified, not intractable, without status migrainosus: G43.909

## 2013-10-20 HISTORY — DX: Gastro-esophageal reflux disease without esophagitis: K21.9

## 2013-10-20 HISTORY — DX: Hyperlipidemia, unspecified: E78.5

## 2013-10-20 LAB — SURGICAL PCR SCREEN
MRSA, PCR: NEGATIVE
STAPHYLOCOCCUS AUREUS: POSITIVE — AB

## 2013-10-20 LAB — CBC
HCT: 35.5 % — ABNORMAL LOW (ref 36.0–46.0)
Hemoglobin: 12 g/dL (ref 12.0–15.0)
MCH: 28.8 pg (ref 26.0–34.0)
MCHC: 33.8 g/dL (ref 30.0–36.0)
MCV: 85.1 fL (ref 78.0–100.0)
Platelets: 221 10*3/uL (ref 150–400)
RBC: 4.17 MIL/uL (ref 3.87–5.11)
RDW: 15 % (ref 11.5–15.5)
WBC: 7.3 10*3/uL (ref 4.0–10.5)

## 2013-10-20 LAB — BASIC METABOLIC PANEL
BUN: 12 mg/dL (ref 6–23)
CO2: 24 mEq/L (ref 19–32)
Calcium: 9.8 mg/dL (ref 8.4–10.5)
Chloride: 105 mEq/L (ref 96–112)
Creatinine, Ser: 0.69 mg/dL (ref 0.50–1.10)
Glucose, Bld: 173 mg/dL — ABNORMAL HIGH (ref 70–99)
POTASSIUM: 4.1 meq/L (ref 3.7–5.3)
SODIUM: 141 meq/L (ref 137–147)

## 2013-10-20 NOTE — Progress Notes (Signed)
Primary physician - [facility] Cardiologist - fath -  Stress test and ekg - [facility] - will request

## 2013-10-20 NOTE — Progress Notes (Signed)
Mupirocin called into [facility] on [address] per patient request for positive nasal swab

## 2013-10-20 NOTE — Pre-Procedure Instructions (Addendum)
Marissa [name]  10/20/2013   Your procedure is scheduled on:  Wednesday, March 18th  Report to Admitting at 0730 AM.  Call this number if you have problems the morning of surgery: [phone]   Remember:   Do not eat food or drink liquids after midnight.   Take these medicines the morning of surgery with A SIP OF WATER: coreg, neurontin, oxycodone if needed Do NOT take any diabetes medication or insulin on morning of surgery.   Do not wear jewelry, make-up or nail polish.  Do not wear lotions, powders, or perfumes, deodorant.  Do not shave 48 hours prior to surgery. Men may shave face and neck.  Do not bring valuables to the hospital.  [facility] is not responsible for any belongings or valuables.               Contacts, dentures or bridgework may not be worn into surgery.  Leave suitcase in the car. After surgery it may be brought to your room.  For patients admitted to the hospital, discharge time is determined by your    treatment team.               Patients discharged the day of surgery will not be allowed to drive home.  Please read over the following fact sheets that you were given: Pain Booklet, Coughing and Deep Breathing, MRSA Information and Surgical Site Infection Prevention  [place] - Preparing for Surgery  Before surgery, you can play an important role.  Because skin is not sterile, your skin needs to be as free of germs as possible.  You can reduce the number of germs on you skin by washing with CHG (chlorahexidine gluconate) soap before surgery.  CHG is an antiseptic cleaner which kills germs and bonds with the skin to continue killing germs even after washing.  Please DO NOT use if you have an allergy to CHG or antibacterial soaps.  If your skin becomes reddened/irritated stop using the CHG and inform your nurse when you arrive at Short Stay.  Do not shave (including legs and underarms) for at least 48 hours prior to the first CHG shower.  You may shave your  face.  Please follow these instructions carefully:   1.  Shower with CHG Soap the night before surgery and the morning of Surgery.  2.  If you choose to wash your hair, wash your hair first as usual with your normal shampoo.  3.  After you shampoo, rinse your hair and body thoroughly to remove the shampoo.  4.  Use CHG as you would any other liquid soap.  You can apply CHG directly to the skin and wash gently with scrungie or a clean washcloth.  5.  Apply the CHG Soap to your body ONLY FROM THE NECK DOWN.  Do not use on open wounds or open sores.  Avoid contact with your eyes, ears, mouth and genitals (private parts).  Wash genitals (private parts) with your normal soap.  6.  Wash thoroughly, paying special attention to the area where your surgery will be performed.  7.  Thoroughly rinse your body with warm water from the neck down.  8.  DO NOT shower/wash with your normal soap after using and rinsing off the CHG Soap.  9.  Pat yourself dry with a clean towel.            10.  Wear clean pajamas.            11.  Place clean sheets on your bed the night of your first shower and do not sleep with pets.  Day of Surgery  Do not apply any lotions/deodorants the morning of surgery.  Please wear clean clothes to the hospital/surgery center.

## 2013-10-28 MED ORDER — CEFAZOLIN SODIUM-DEXTROSE 2-3 GM-% IV SOLR
2.0000 g | INTRAVENOUS | Status: AC
  Administered 2013-10-29: 2 g via INTRAVENOUS
  Filled 2013-10-28: qty 50

## 2013-10-29 ENCOUNTER — Encounter (HOSPITAL_COMMUNITY)
Admission: RE | Disposition: A | Discharge status: Home / Self Care | Payer: Self-pay | Source: Ambulatory Visit | Attending: Neurosurgery

## 2013-10-29 ENCOUNTER — Encounter (HOSPITAL_COMMUNITY): Payer: Self-pay | Admitting: Surgery

## 2013-10-29 ENCOUNTER — Inpatient Hospital Stay (HOSPITAL_COMMUNITY)
Admission: RE | Admit: 2013-10-29 | Discharge: 2013-11-01 | DRG: 520 | Disposition: A | Discharge status: Home / Self Care | Payer: Medicare HMO | Source: Ambulatory Visit | Attending: Neurosurgery | Admitting: Neurosurgery

## 2013-10-29 ENCOUNTER — Ambulatory Visit (HOSPITAL_COMMUNITY): Payer: Medicare HMO | Admitting: Certified Registered Nurse Anesthetist

## 2013-10-29 ENCOUNTER — Ambulatory Visit (HOSPITAL_COMMUNITY): Payer: Medicare HMO

## 2013-10-29 ENCOUNTER — Encounter (HOSPITAL_COMMUNITY): Payer: Medicare HMO | Admitting: Certified Registered Nurse Anesthetist

## 2013-10-29 DIAGNOSIS — I2510 Atherosclerotic heart disease of native coronary artery without angina pectoris: Secondary | ICD-10-CM | POA: Diagnosis present

## 2013-10-29 DIAGNOSIS — I959 Hypotension, unspecified: Secondary | ICD-10-CM | POA: Diagnosis present

## 2013-10-29 DIAGNOSIS — Z79899 Other long term (current) drug therapy: Secondary | ICD-10-CM

## 2013-10-29 DIAGNOSIS — K219 Gastro-esophageal reflux disease without esophagitis: Secondary | ICD-10-CM | POA: Diagnosis present

## 2013-10-29 DIAGNOSIS — E1149 Type 2 diabetes mellitus with other diabetic neurological complication: Secondary | ICD-10-CM | POA: Diagnosis present

## 2013-10-29 DIAGNOSIS — Z981 Arthrodesis status: Secondary | ICD-10-CM

## 2013-10-29 DIAGNOSIS — E1142 Type 2 diabetes mellitus with diabetic polyneuropathy: Secondary | ICD-10-CM | POA: Diagnosis present

## 2013-10-29 DIAGNOSIS — I10 Essential (primary) hypertension: Secondary | ICD-10-CM | POA: Diagnosis present

## 2013-10-29 DIAGNOSIS — R339 Retention of urine, unspecified: Secondary | ICD-10-CM | POA: Diagnosis present

## 2013-10-29 DIAGNOSIS — M549 Dorsalgia, unspecified: Secondary | ICD-10-CM | POA: Diagnosis present

## 2013-10-29 DIAGNOSIS — M5126 Other intervertebral disc displacement, lumbar region: Principal | ICD-10-CM | POA: Diagnosis present

## 2013-10-29 DIAGNOSIS — Z794 Long term (current) use of insulin: Secondary | ICD-10-CM

## 2013-10-29 DIAGNOSIS — M7130 Other bursal cyst, unspecified site: Secondary | ICD-10-CM | POA: Diagnosis present

## 2013-10-29 DIAGNOSIS — I252 Old myocardial infarction: Secondary | ICD-10-CM

## 2013-10-29 HISTORY — PX: LUMBAR LAMINECTOMY/DECOMPRESSION MICRODISCECTOMY: SHX5026

## 2013-10-29 LAB — GLUCOSE, CAPILLARY
GLUCOSE-CAPILLARY: 90 mg/dL (ref 70–99)
GLUCOSE-CAPILLARY: 295 mg/dL — AB (ref 70–99)
Glucose-Capillary: 115 mg/dL — ABNORMAL HIGH (ref 70–99)
Glucose-Capillary: 237 mg/dL — ABNORMAL HIGH (ref 70–99)

## 2013-10-29 SURGERY — LUMBAR LAMINECTOMY/DECOMPRESSION MICRODISCECTOMY 1 LEVEL
Anesthesia: General | Site: Back | Laterality: Left

## 2013-10-29 MED ORDER — PHENYLEPHRINE HCL 10 MG/ML IJ SOLN
INTRAMUSCULAR | Status: DC | PRN
  Administered 2013-10-29 (×3): 40 ug via INTRAVENOUS

## 2013-10-29 MED ORDER — FENTANYL CITRATE 0.05 MG/ML IJ SOLN
INTRAMUSCULAR | Status: AC
  Filled 2013-10-29: qty 5

## 2013-10-29 MED ORDER — GLYCOPYRROLATE 0.2 MG/ML IJ SOLN
INTRAMUSCULAR | Status: DC | PRN
  Administered 2013-10-29: .8 mg via INTRAVENOUS

## 2013-10-29 MED ORDER — STERILE WATER FOR INJECTION IJ SOLN
INTRAMUSCULAR | Status: AC
  Filled 2013-10-29: qty 10

## 2013-10-29 MED ORDER — HYDROMORPHONE HCL PF 1 MG/ML IJ SOLN
INTRAMUSCULAR | Status: AC
  Administered 2013-10-29: 0.5 mg via INTRAVENOUS
  Filled 2013-10-29: qty 1

## 2013-10-29 MED ORDER — HYDROMORPHONE HCL PF 1 MG/ML IJ SOLN
INTRAMUSCULAR | Status: AC
  Filled 2013-10-29: qty 1

## 2013-10-29 MED ORDER — NEOSTIGMINE METHYLSULFATE 1 MG/ML IJ SOLN
INTRAMUSCULAR | Status: DC | PRN
  Administered 2013-10-29: 5 mg via INTRAVENOUS

## 2013-10-29 MED ORDER — SODIUM CHLORIDE 0.9 % IJ SOLN
3.0000 mL | Freq: Two times a day (BID) | INTRAMUSCULAR | Status: DC
  Administered 2013-10-29 – 2013-11-01 (×4): 3 mL via INTRAVENOUS

## 2013-10-29 MED ORDER — ACETAMINOPHEN 325 MG PO TABS
650.0000 mg | ORAL_TABLET | ORAL | Status: DC | PRN
  Administered 2013-10-30 – 2013-10-31 (×2): 650 mg via ORAL
  Filled 2013-10-29 (×2): qty 2

## 2013-10-29 MED ORDER — EPHEDRINE SULFATE 50 MG/ML IJ SOLN
INTRAMUSCULAR | Status: AC
  Filled 2013-10-29: qty 1

## 2013-10-29 MED ORDER — INSULIN ASPART 100 UNIT/ML ~~LOC~~ SOLN
0.0000 [IU] | Freq: Three times a day (TID) | SUBCUTANEOUS | Status: DC
Start: 2013-10-30 — End: 2013-10-29

## 2013-10-29 MED ORDER — ARTIFICIAL TEARS OP OINT
TOPICAL_OINTMENT | OPHTHALMIC | Status: AC
  Filled 2013-10-29: qty 3.5

## 2013-10-29 MED ORDER — HYDROMORPHONE HCL PF 1 MG/ML IJ SOLN: 0.2500 mg | INTRAMUSCULAR | Status: DC | PRN

## 2013-10-29 MED ORDER — TOPIRAMATE 100 MG PO TABS: 100.0000 mg | ORAL_TABLET | Freq: Every day | ORAL | Status: DC

## 2013-10-29 MED ORDER — EPHEDRINE SULFATE 50 MG/ML IJ SOLN
INTRAMUSCULAR | Status: DC | PRN
  Administered 2013-10-29: 10 mg via INTRAVENOUS
  Administered 2013-10-29 (×2): 5 mg via INTRAVENOUS

## 2013-10-29 MED ORDER — INSULIN ASPART 100 UNIT/ML ~~LOC~~ SOLN
0.0000 [IU] | Freq: Three times a day (TID) | SUBCUTANEOUS | Status: DC
Start: 2013-10-29 — End: 2013-11-01
  Administered 2013-10-29: 7 [IU] via SUBCUTANEOUS
  Administered 2013-10-30: 4 [IU] via SUBCUTANEOUS
  Administered 2013-10-30: 20 [IU] via SUBCUTANEOUS
  Administered 2013-10-30: 15 [IU] via SUBCUTANEOUS
  Administered 2013-10-31: 7 [IU] via SUBCUTANEOUS
  Administered 2013-10-31: 4 [IU] via SUBCUTANEOUS
  Administered 2013-11-01: 7 [IU] via SUBCUTANEOUS

## 2013-10-29 MED ORDER — SUCCINYLCHOLINE CHLORIDE 20 MG/ML IJ SOLN
INTRAMUSCULAR | Status: DC | PRN
  Administered 2013-10-29: 100 mg via INTRAVENOUS

## 2013-10-29 MED ORDER — 0.9 % SODIUM CHLORIDE (POUR BTL) OPTIME
TOPICAL | Status: DC | PRN
  Administered 2013-10-29: 1000 mL

## 2013-10-29 MED ORDER — INSULIN ASPART 100 UNIT/ML ~~LOC~~ SOLN
4.0000 [IU] | Freq: Three times a day (TID) | SUBCUTANEOUS | Status: DC
  Administered 2013-10-29 – 2013-10-30 (×3): 4 [IU] via SUBCUTANEOUS

## 2013-10-29 MED ORDER — ONDANSETRON HCL 4 MG/2ML IJ SOLN
INTRAMUSCULAR | Status: DC | PRN
  Administered 2013-10-29: 4 mg via INTRAVENOUS

## 2013-10-29 MED ORDER — LIRAGLUTIDE 18 MG/3ML ~~LOC~~ SOPN: 1.8000 mg | PEN_INJECTOR | Freq: Every day | SUBCUTANEOUS | Status: DC

## 2013-10-29 MED ORDER — SUMATRIPTAN SUCCINATE 50 MG PO TABS
50.0000 mg | ORAL_TABLET | ORAL | Status: DC | PRN
  Filled 2013-10-29: qty 1

## 2013-10-29 MED ORDER — AMITRIPTYLINE HCL 100 MG PO TABS
100.0000 mg | ORAL_TABLET | Freq: Every day | ORAL | Status: DC
  Administered 2013-10-29 – 2013-10-30 (×2): 100 mg via ORAL
  Filled 2013-10-29 (×3): qty 1

## 2013-10-29 MED ORDER — LIDOCAINE HCL 4 % MT SOLN
OROMUCOSAL | Status: DC | PRN
  Administered 2013-10-29: 4 mL via TOPICAL

## 2013-10-29 MED ORDER — MIDAZOLAM HCL 2 MG/2ML IJ SOLN
INTRAMUSCULAR | Status: AC
  Filled 2013-10-29: qty 2

## 2013-10-29 MED ORDER — CYCLOBENZAPRINE HCL 10 MG PO TABS
ORAL_TABLET | ORAL | Status: AC
  Filled 2013-10-29: qty 1

## 2013-10-29 MED ORDER — INSULIN ASPART 100 UNIT/ML FLEXPEN: 10.0000 [IU] | PEN_INJECTOR | Freq: Three times a day (TID) | SUBCUTANEOUS | Status: DC

## 2013-10-29 MED ORDER — HEMOSTATIC AGENTS (NO CHARGE) OPTIME
TOPICAL | Status: DC | PRN
  Administered 2013-10-29: 1 via TOPICAL

## 2013-10-29 MED ORDER — LISINOPRIL 40 MG PO TABS
40.0000 mg | ORAL_TABLET | Freq: Every day | ORAL | Status: DC
  Administered 2013-10-29: 40 mg via ORAL
  Filled 2013-10-29 (×3): qty 1

## 2013-10-29 MED ORDER — ATORVASTATIN CALCIUM 80 MG PO TABS
80.0000 mg | ORAL_TABLET | Freq: Every day | ORAL | Status: DC
  Administered 2013-10-29 – 2013-11-01 (×4): 80 mg via ORAL
  Filled 2013-10-29 (×4): qty 1

## 2013-10-29 MED ORDER — PROPOFOL 10 MG/ML IV BOLUS
INTRAVENOUS | Status: DC | PRN
  Administered 2013-10-29: 200 mg via INTRAVENOUS

## 2013-10-29 MED ORDER — TOPIRAMATE 25 MG PO TABS: 25.0000 mg | ORAL_TABLET | Freq: Every day | ORAL | Status: DC

## 2013-10-29 MED ORDER — SUCCINYLCHOLINE CHLORIDE 20 MG/ML IJ SOLN
INTRAMUSCULAR | Status: AC
  Filled 2013-10-29: qty 1

## 2013-10-29 MED ORDER — METFORMIN HCL 500 MG PO TABS
1000.0000 mg | ORAL_TABLET | Freq: Two times a day (BID) | ORAL | Status: DC
  Administered 2013-10-29 – 2013-10-31 (×4): 1000 mg via ORAL
  Filled 2013-10-29 (×6): qty 2

## 2013-10-29 MED ORDER — ROCURONIUM BROMIDE 50 MG/5ML IV SOLN
INTRAVENOUS | Status: AC
  Filled 2013-10-29: qty 1

## 2013-10-29 MED ORDER — PHENOL 1.4 % MT LIQD
1.0000 | OROMUCOSAL | Status: DC | PRN
  Filled 2013-10-29: qty 177

## 2013-10-29 MED ORDER — LIDOCAINE-EPINEPHRINE 1 %-1:100000 IJ SOLN
INTRAMUSCULAR | Status: DC | PRN
  Administered 2013-10-29: 10 mL

## 2013-10-29 MED ORDER — CARVEDILOL 6.25 MG PO TABS
6.2500 mg | ORAL_TABLET | Freq: Two times a day (BID) | ORAL | Status: DC
  Administered 2013-10-29 – 2013-10-31 (×4): 6.25 mg via ORAL
  Filled 2013-10-29 (×6): qty 1

## 2013-10-29 MED ORDER — ONDANSETRON HCL 4 MG/2ML IJ SOLN
INTRAMUSCULAR | Status: AC
  Filled 2013-10-29: qty 2

## 2013-10-29 MED ORDER — PANTOPRAZOLE SODIUM 40 MG PO TBEC
40.0000 mg | DELAYED_RELEASE_TABLET | Freq: Two times a day (BID) | ORAL | Status: DC
  Administered 2013-10-29 – 2013-11-01 (×6): 40 mg via ORAL
  Filled 2013-10-29 (×6): qty 1

## 2013-10-29 MED ORDER — LIDOCAINE HCL (CARDIAC) 20 MG/ML IV SOLN
INTRAVENOUS | Status: DC | PRN
  Administered 2013-10-29: 100 mg via INTRAVENOUS

## 2013-10-29 MED ORDER — LIDOCAINE HCL (CARDIAC) 20 MG/ML IV SOLN
INTRAVENOUS | Status: AC
  Filled 2013-10-29: qty 10

## 2013-10-29 MED ORDER — THROMBIN 5000 UNITS EX SOLR
CUTANEOUS | Status: DC | PRN
  Administered 2013-10-29 (×2): 5000 [IU] via TOPICAL

## 2013-10-29 MED ORDER — CARVEDILOL 6.25 MG PO TABS
6.2500 mg | ORAL_TABLET | Freq: Once | ORAL | Status: AC
  Administered 2013-10-29: 6.25 mg via ORAL
  Filled 2013-10-29: qty 1
  Filled 2013-10-29: qty 2

## 2013-10-29 MED ORDER — HYDROMORPHONE HCL PF 1 MG/ML IJ SOLN
0.5000 mg | INTRAMUSCULAR | Status: DC | PRN
Start: 2013-10-29 — End: 2013-10-31
  Administered 2013-10-29: 1 mg via INTRAVENOUS
  Filled 2013-10-29: qty 1

## 2013-10-29 MED ORDER — INSULIN ASPART 100 UNIT/ML ~~LOC~~ SOLN
0.0000 [IU] | Freq: Every day | SUBCUTANEOUS | Status: DC
  Administered 2013-10-29: 3 [IU] via SUBCUTANEOUS
  Administered 2013-10-30: 2 [IU] via SUBCUTANEOUS

## 2013-10-29 MED ORDER — HYDROMORPHONE HCL PF 1 MG/ML IJ SOLN
0.2500 mg | INTRAMUSCULAR | Status: DC | PRN
  Administered 2013-10-29 (×6): 0.5 mg via INTRAVENOUS

## 2013-10-29 MED ORDER — PROPOFOL 10 MG/ML IV BOLUS
INTRAVENOUS | Status: AC
  Filled 2013-10-29: qty 20

## 2013-10-29 MED ORDER — ONDANSETRON HCL 4 MG/2ML IJ SOLN: 4.0000 mg | Freq: Once | INTRAMUSCULAR | Status: DC | PRN

## 2013-10-29 MED ORDER — INSULIN ASPART 100 UNIT/ML ~~LOC~~ SOLN
4.0000 [IU] | Freq: Three times a day (TID) | SUBCUTANEOUS | Status: DC
Start: 2013-10-30 — End: 2013-10-29

## 2013-10-29 MED ORDER — DEXAMETHASONE SODIUM PHOSPHATE 10 MG/ML IJ SOLN
INTRAMUSCULAR | Status: AC
  Filled 2013-10-29: qty 1

## 2013-10-29 MED ORDER — NITROGLYCERIN 0.4 MG SL SUBL: 0.4000 mg | SUBLINGUAL_TABLET | SUBLINGUAL | Status: DC | PRN

## 2013-10-29 MED ORDER — GABAPENTIN 300 MG PO CAPS
600.0000 mg | ORAL_CAPSULE | Freq: Three times a day (TID) | ORAL | Status: DC
  Administered 2013-10-29 – 2013-10-31 (×5): 600 mg via ORAL
  Filled 2013-10-29 (×8): qty 2

## 2013-10-29 MED ORDER — ROCURONIUM BROMIDE 100 MG/10ML IV SOLN
INTRAVENOUS | Status: DC | PRN
  Administered 2013-10-29: 10 mg via INTRAVENOUS
  Administered 2013-10-29: 30 mg via INTRAVENOUS

## 2013-10-29 MED ORDER — PHENYLEPHRINE 40 MCG/ML (10ML) SYRINGE FOR IV PUSH (FOR BLOOD PRESSURE SUPPORT)
PREFILLED_SYRINGE | INTRAVENOUS | Status: AC
  Filled 2013-10-29: qty 10

## 2013-10-29 MED ORDER — CYCLOBENZAPRINE HCL 10 MG PO TABS
10.0000 mg | ORAL_TABLET | Freq: Three times a day (TID) | ORAL | Status: DC | PRN
  Administered 2013-10-29 – 2013-10-31 (×5): 10 mg via ORAL
  Filled 2013-10-29 (×5): qty 1

## 2013-10-29 MED ORDER — OXYCODONE HCL 5 MG PO TABS
30.0000 mg | ORAL_TABLET | ORAL | Status: DC | PRN
  Administered 2013-10-29 (×2): 30 mg via ORAL
  Filled 2013-10-29 (×2): qty 6

## 2013-10-29 MED ORDER — MIDAZOLAM HCL 5 MG/5ML IJ SOLN
INTRAMUSCULAR | Status: DC | PRN
  Administered 2013-10-29: 2 mg via INTRAVENOUS

## 2013-10-29 MED ORDER — CEFAZOLIN SODIUM 1-5 GM-% IV SOLN
1.0000 g | Freq: Three times a day (TID) | INTRAVENOUS | Status: AC
  Administered 2013-10-29 – 2013-10-30 (×2): 1 g via INTRAVENOUS
  Filled 2013-10-29 (×2): qty 50

## 2013-10-29 MED ORDER — TIZANIDINE HCL 4 MG PO TABS
4.0000 mg | ORAL_TABLET | Freq: Three times a day (TID) | ORAL | Status: DC
  Administered 2013-10-29 – 2013-10-31 (×6): 4 mg via ORAL
  Filled 2013-10-29 (×8): qty 1

## 2013-10-29 MED ORDER — CARVEDILOL 3.125 MG PO TABS
ORAL_TABLET | ORAL | Status: AC
Start: 2013-10-29 — End: 2013-10-29
  Administered 2013-10-29: 6.25 mg via ORAL
  Filled 2013-10-29: qty 1

## 2013-10-29 MED ORDER — BUPIVACAINE HCL (PF) 0.25 % IJ SOLN
INTRAMUSCULAR | Status: DC | PRN
  Administered 2013-10-29: 10 mL

## 2013-10-29 MED ORDER — DIPHENHYDRAMINE HCL 25 MG PO CAPS
25.0000 mg | ORAL_CAPSULE | Freq: Four times a day (QID) | ORAL | Status: DC | PRN
  Administered 2013-10-29 – 2013-10-30 (×2): 25 mg via ORAL
  Filled 2013-10-29 (×2): qty 1

## 2013-10-29 MED ORDER — DEXAMETHASONE SODIUM PHOSPHATE 10 MG/ML IJ SOLN
10.0000 mg | Freq: Four times a day (QID) | INTRAMUSCULAR | Status: DC
  Administered 2013-10-29 – 2013-10-30 (×4): 10 mg via INTRAVENOUS
  Filled 2013-10-29 (×7): qty 1

## 2013-10-29 MED ORDER — TOPIRAMATE 25 MG PO TABS
125.0000 mg | ORAL_TABLET | Freq: Every day | ORAL | Status: DC
  Administered 2013-10-29 – 2013-10-31 (×3): 125 mg via ORAL
  Filled 2013-10-29 (×5): qty 1

## 2013-10-29 MED ORDER — ACETAMINOPHEN 650 MG RE SUPP: 650.0000 mg | RECTAL | Status: DC | PRN

## 2013-10-29 MED ORDER — DOCUSATE SODIUM 100 MG PO CAPS
100.0000 mg | ORAL_CAPSULE | Freq: Two times a day (BID) | ORAL | Status: DC
  Administered 2013-10-29 – 2013-11-01 (×4): 100 mg via ORAL
  Filled 2013-10-29 (×7): qty 1

## 2013-10-29 MED ORDER — ONDANSETRON HCL 4 MG/2ML IJ SOLN
4.0000 mg | INTRAMUSCULAR | Status: DC | PRN
  Administered 2013-10-29: 4 mg via INTRAVENOUS
  Filled 2013-10-29: qty 2

## 2013-10-29 MED ORDER — SODIUM CHLORIDE 0.9 % IJ SOLN: 3.0000 mL | INTRAMUSCULAR | Status: DC | PRN

## 2013-10-29 MED ORDER — LACTATED RINGERS IV SOLN
INTRAVENOUS | Status: DC
  Administered 2013-10-29 (×2): via INTRAVENOUS

## 2013-10-29 MED ORDER — FENTANYL CITRATE 0.05 MG/ML IJ SOLN
INTRAMUSCULAR | Status: DC | PRN
  Administered 2013-10-29: 50 ug via INTRAVENOUS
  Administered 2013-10-29 (×2): 100 ug via INTRAVENOUS
  Administered 2013-10-29: 50 ug via INTRAVENOUS
  Administered 2013-10-29 (×2): 100 ug via INTRAVENOUS

## 2013-10-29 MED ORDER — GLYCOPYRROLATE 0.2 MG/ML IJ SOLN
INTRAMUSCULAR | Status: AC
  Filled 2013-10-29: qty 4

## 2013-10-29 MED ORDER — SODIUM CHLORIDE 0.9 % IR SOLN
Status: DC | PRN
  Administered 2013-10-29: 11:00:00

## 2013-10-29 MED ORDER — MENTHOL 3 MG MT LOZG: 1.0000 | LOZENGE | OROMUCOSAL | Status: DC | PRN

## 2013-10-29 MED ORDER — NEOSTIGMINE METHYLSULFATE 1 MG/ML IJ SOLN
INTRAMUSCULAR | Status: AC
  Filled 2013-10-29: qty 10

## 2013-10-29 SURGICAL SUPPLY — 64 items
BAG DECANTER FOR FLEXI CONT (MISCELLANEOUS) ×2 IMPLANT
BENZOIN TINCTURE PRP APPL 2/3 (GAUZE/BANDAGES/DRESSINGS) ×2 IMPLANT
BLADE SURG 11 STRL SS (BLADE) ×2 IMPLANT
BLADE SURG ROTATE 9660 (MISCELLANEOUS) IMPLANT
BRUSH SCRUB EZ PLAIN DRY (MISCELLANEOUS) ×2 IMPLANT
BUR MATCHSTICK NEURO 3.0 LAGG (BURR) ×2 IMPLANT
BUR PRECISION FLUTE 6.0 (BURR) ×2 IMPLANT
CANISTER SUCT 3000ML (MISCELLANEOUS) ×2 IMPLANT
CONT SPEC 4OZ CLIKSEAL STRL BL (MISCELLANEOUS) ×2 IMPLANT
DECANTER SPIKE VIAL GLASS SM (MISCELLANEOUS) ×2 IMPLANT
DERMABOND ADHESIVE PROPEN (GAUZE/BANDAGES/DRESSINGS) ×1
DERMABOND ADVANCED (GAUZE/BANDAGES/DRESSINGS)
DERMABOND ADVANCED .7 DNX12 (GAUZE/BANDAGES/DRESSINGS) IMPLANT
DERMABOND ADVANCED .7 DNX6 (GAUZE/BANDAGES/DRESSINGS) ×1 IMPLANT
DRAPE LAPAROTOMY 100X72X124 (DRAPES) ×2 IMPLANT
DRAPE MICROSCOPE ZEISS OPMI (DRAPES) ×2 IMPLANT
DRAPE POUCH INSTRU U-SHP 10X18 (DRAPES) ×2 IMPLANT
DRAPE PROXIMA HALF (DRAPES) IMPLANT
DRAPE SURG 17X23 STRL (DRAPES) ×2 IMPLANT
DRSG OPSITE 4X5.5 SM (GAUZE/BANDAGES/DRESSINGS) IMPLANT
DRSG OPSITE POSTOP 3X4 (GAUZE/BANDAGES/DRESSINGS) IMPLANT
DRSG OPSITE POSTOP 4X6 (GAUZE/BANDAGES/DRESSINGS) ×2 IMPLANT
DURAPREP 26ML APPLICATOR (WOUND CARE) ×2 IMPLANT
ELECT REM PT RETURN 9FT ADLT (ELECTROSURGICAL) ×2
ELECTRODE REM PT RTRN 9FT ADLT (ELECTROSURGICAL) ×1 IMPLANT
GAUZE SPONGE 4X4 16PLY XRAY LF (GAUZE/BANDAGES/DRESSINGS) IMPLANT
GLOVE BIO SURGEON STRL SZ8 (GLOVE) ×2 IMPLANT
GLOVE BIOGEL PI IND STRL 7.0 (GLOVE) ×3 IMPLANT
GLOVE BIOGEL PI IND STRL 7.5 (GLOVE) ×1 IMPLANT
GLOVE BIOGEL PI INDICATOR 7.0 (GLOVE) ×3
GLOVE BIOGEL PI INDICATOR 7.5 (GLOVE) ×1
GLOVE ECLIPSE 6.5 STRL STRAW (GLOVE) ×2 IMPLANT
GLOVE ECLIPSE 7.5 STRL STRAW (GLOVE) ×4 IMPLANT
GLOVE EXAM NITRILE LRG STRL (GLOVE) IMPLANT
GLOVE EXAM NITRILE MD LF STRL (GLOVE) IMPLANT
GLOVE EXAM NITRILE XL STR (GLOVE) IMPLANT
GLOVE EXAM NITRILE XS STR PU (GLOVE) IMPLANT
GLOVE INDICATOR 8.5 STRL (GLOVE) ×2 IMPLANT
GLOVE SURG SS PI 7.0 STRL IVOR (GLOVE) ×6 IMPLANT
GOWN BRE IMP SLV AUR LG STRL (GOWN DISPOSABLE) IMPLANT
GOWN BRE IMP SLV AUR XL STRL (GOWN DISPOSABLE) IMPLANT
GOWN STRL REIN 2XL LVL4 (GOWN DISPOSABLE) IMPLANT
GOWN STRL REUS W/ TWL LRG LVL3 (GOWN DISPOSABLE) ×1 IMPLANT
GOWN STRL REUS W/ TWL XL LVL3 (GOWN DISPOSABLE) ×4 IMPLANT
GOWN STRL REUS W/TWL LRG LVL3 (GOWN DISPOSABLE) ×1
GOWN STRL REUS W/TWL XL LVL3 (GOWN DISPOSABLE) ×4
KIT BASIN OR (CUSTOM PROCEDURE TRAY) ×2 IMPLANT
KIT ROOM TURNOVER OR (KITS) ×2 IMPLANT
NEEDLE HYPO 22GX1.5 SAFETY (NEEDLE) ×2 IMPLANT
NEEDLE SPNL 22GX3.5 QUINCKE BK (NEEDLE) ×2 IMPLANT
NS IRRIG 1000ML POUR BTL (IV SOLUTION) ×2 IMPLANT
PACK LAMINECTOMY NEURO (CUSTOM PROCEDURE TRAY) ×2 IMPLANT
RUBBERBAND STERILE (MISCELLANEOUS) ×4 IMPLANT
SPONGE GAUZE 4X4 12PLY (GAUZE/BANDAGES/DRESSINGS) ×2 IMPLANT
SPONGE SURGIFOAM ABS GEL SZ50 (HEMOSTASIS) ×2 IMPLANT
STRIP CLOSURE SKIN 1/2X4 (GAUZE/BANDAGES/DRESSINGS) ×2 IMPLANT
SUT VIC AB 0 CT1 18XCR BRD8 (SUTURE) ×1 IMPLANT
SUT VIC AB 0 CT1 8-18 (SUTURE) ×1
SUT VIC AB 2-0 CT1 18 (SUTURE) ×2 IMPLANT
SUT VICRYL 4-0 PS2 18IN ABS (SUTURE) ×2 IMPLANT
SYR 20ML ECCENTRIC (SYRINGE) ×2 IMPLANT
TOWEL OR 17X24 6PK STRL BLUE (TOWEL DISPOSABLE) ×2 IMPLANT
TOWEL OR 17X26 10 PK STRL BLUE (TOWEL DISPOSABLE) ×2 IMPLANT
WATER STERILE IRR 1000ML POUR (IV SOLUTION) ×2 IMPLANT

## 2013-10-29 NOTE — Anesthesia Procedure Notes (Signed)
Procedure Name: Intubation Date/Time: 10/29/2013 10:32 AM Performed by: [place] Pink Pre-anesthesia Checklist: Patient identified, Timeout performed, Emergency Drugs available, Suction available and Patient being monitored Patient Re-evaluated:Patient Re-evaluated prior to inductionOxygen Delivery Method: Circle system utilized Preoxygenation: Pre-oxygenation with 100% oxygen Intubation Type: IV induction Ventilation: Mask ventilation without difficulty Laryngoscope Size: Mac and 3 Grade View: Grade I Nasal Tubes: Right Tube size: 7.5 mm Number of attempts: 1 Airway Equipment and Method: Stylet and LTA kit utilized Placement Confirmation: ETT inserted through vocal cords under direct vision,  positive ETCO2 and breath sounds checked- equal and bilateral Secured at: 21 cm Tube secured with: Tape Dental Injury: Teeth and Oropharynx as per pre-operative assessment

## 2013-10-29 NOTE — Transfer of Care (Signed)
Immediate Anesthesia Transfer of Care Note  Patient: Marissa [name]  Procedure(s) Performed: Procedure(s): Left Lumbar five-Sacral one Laminectomy (Left)  Patient Location: PACU  Anesthesia Type:General  Level of Consciousness: awake, alert  and oriented  Airway & Oxygen Therapy: Patient Spontanous Breathing and Patient connected to nasal cannula oxygen  Post-op Assessment: Report given to PACU RN, Post -op Vital signs reviewed and stable and Patient moving all extremities X 4  Post vital signs: Reviewed and stable  Complications: No apparent anesthesia complications

## 2013-10-29 NOTE — Progress Notes (Signed)
Pt states she is a 10/10 then falls to sleep

## 2013-10-29 NOTE — Op Note (Signed)
Preoperative diagnosis: Left S1 radiculopathy from synovial cyst and lumbar stenosis L5-S1 left  Postoperative diagnosis: Herniated nucleus pulposus L5-S1 left  Procedure: Lumbar laminectomy discectomy L5-S1 left with microdissection of left S1 and L5 nerve roots and microscopic discectomy  Surgeon: Dominica Severin Harnoor Kohles  Anesthesia: Gen.  EBL: Minimal  History of present illness: Patient is a 38 year female whose had long-standing back trouble developed over last weeks with progressive worsening back and left leg pain rate and the back and outside of her left thigh left calf in the outside of her left foot consistent with an S1 nerve root pattern workup revealed possible synovial cyst in the setting of previous surgery on the left at L5-S1 patient displayed no evidence of instability has a recommended laminectomy for resection stenosis L5-S1 left I extensively reviewed the risks and benefits of the operation the patient as well as perioperative course expectations of outcome and alternatives surgery and she understood and agreed to proceed forward.  Operative procedure: Patient brought into the or was induced under general anesthesia positioned prone the Wilson frame her back was prepped and draped in routine sterile fashion. Her inferior old incision was opened up and the scar tissues dissected free exposing the laminotomy defect and L5-S1 left. Interoperative x-ray confirmed the appropriate level so scar tissues dissected off of the inferior aspect lamina of L5 medial facet complex aggressive S1 laminotomy medial facetectomy and superior aspect lamina this was further drilled down laminotomy was extended the dura wasn't visualized the S1 nerve was identified and the S1 pedicle was identified and under microscopic illumination the S1 nerve was dissected off of the pedicle over what appeared to be a very large disc herniations still partially contained scar and ligament at L5-S1 there is no synovial cyst  appreciated. I marched superiorly all he at level the L5 pedicle and the L5 nerve root and then brought back down to the disc space level. Dissecting the S1 nerve root off of a large disc herniation several large fragments were removed from the disc space. Again the discectomy there is no further stenosis either centrally or foraminally along the S1 nerve root the wounds and copious irrigated meticulous hemostasis was maintained Gelfoam was overlaid top of the dura and the muscle and fascia reapproximated layers with interrupted Vicryl and the skin was closed with a running 4 subcuticular benzoin and Steri-Strips were applied patient recovered in stable condition. At the end of case on it counts sponge counts were correct.

## 2013-10-29 NOTE — Anesthesia Postprocedure Evaluation (Signed)
  Anesthesia Post-op Note  Patient: Marissa [name]  Procedure(s) Performed: Procedure(s): Left Lumbar five-Sacral one Laminectomy (Left)  Patient Location: PACU  Anesthesia Type:General  Level of Consciousness: awake, alert , oriented and patient cooperative  Airway and Oxygen Therapy: Patient Spontanous Breathing  Post-op Pain: mild  Post-op Assessment: Post-op Vital signs reviewed, Patient's Cardiovascular Status Stable, Respiratory Function Stable, Patent Airway, No signs of Nausea or vomiting and Pain level controlled  Post-op Vital Signs: stable  Complications: No apparent anesthesia complications

## 2013-10-29 NOTE — Anesthesia Preprocedure Evaluation (Signed)
Anesthesia Evaluation  Patient identified by MRN, date of birth, ID band Patient awake    Reviewed: Allergy & Precautions, H&P , NPO status , Patient's Chart, lab work & pertinent test results  Airway       Dental   Pulmonary          Cardiovascular hypertension, + CAD and + Past MI + dysrhythmias     Neuro/Psych  Headaches,  Neuromuscular disease    GI/Hepatic GERD-  ,  Endo/Other  diabetes, Type 2, Insulin Dependent, Oral Hypoglycemic Agents  Renal/GU      Musculoskeletal   Abdominal   Peds  Hematology   Anesthesia Other Findings   Reproductive/Obstetrics                           Anesthesia Physical Anesthesia Plan  ASA: III  Anesthesia Plan: General   Post-op Pain Management:    Induction: Intravenous  Airway Management Planned: Oral ETT  Additional Equipment:   Intra-op Plan:   Post-operative Plan: Extubation in OR  Informed Consent: I have reviewed the patients History and Physical, chart, labs and discussed the procedure including the risks, benefits and alternatives for the proposed anesthesia with the patient or authorized representative who has indicated his/her understanding and acceptance.     Plan Discussed with:   Anesthesia Plan Comments:         Anesthesia Quick Evaluation

## 2013-10-29 NOTE — Progress Notes (Signed)
Dr. Tamala Julian aware of pain is still a 10 after 2 of Dilaudid

## 2013-10-29 NOTE — Preoperative (Signed)
Beta Blockers   Reason not to administer Beta Blockers:Not Applicable, took coreg this am

## 2013-10-29 NOTE — H&P (Signed)
Marissa [name] is an 38 y.o. female.   Chief Complaint: Back and left leg pain HPI: Patient is a 38 year old female is a long-standing back pain which had undergone a previous L1 to discectomy on the right and did very well however postoperatively over the several months during her recovery [and left leg radicular symptoms. Workup revealed a synovial cyst on the left at L5-S1 and patient subsequently underwent epidural steroid injections without relief and develop progress worsening pain and some weakness in her toe. At this point she was recommended laminectomy and resection of synovial cyst after failed all forms of conservative treatment. I extensively reviewed the risks and benefits of the operation with the patient as well as perioperative course expectations of outcome and alternatives of surgery and she understood and agreed to proceed forward.  Past Medical History  Diagnosis Date  . Hypertension   . Coronary artery disease   . Neuropathy   . Headache(784.0)   . Arthritis   . Vision loss     due to diabetes  . Myocardial infarction 2006    "due to medication"; no evidence of ischemia or infarction by nuclear stress test '11  . Dysrhythmia     tachycardia  . Diabetes mellitus without complication     fasting cbg 50-140s  . GERD (gastroesophageal reflux disease)     otc meds  . Migraines     once/month maybe - can last up to two weeks  . Hyperlipemia     Past Surgical History  Procedure Laterality Date  . Back surgery  2011    Lumbar   . Cervical fusion  2006  . Salpingoophorectomy Bilateral     1 1997. 2nd 2001  . Cardiac catheterization  07/18/2004    50-60% mid LAD, minor luminal irregularities RCA, normal LM and CX, EF 50-55% [facility])  . Lumbar laminectomy/decompression microdiscectomy Right 05/07/2013    Procedure: Right Lumbar one-two laminectomy;  Surgeon: Elaina Hoops, MD;  Location: [facility];  Service: Neurosurgery;  Laterality: Right;  . Carpel  tunnel Bilateral     History reviewed. No pertinent family history. Social History:  reports that she has never smoked. She does not have any smokeless tobacco history on file. She reports that she does not drink alcohol or use illicit drugs.  Allergies:  Allergies  Allergen Reactions  . Ciprocinonide [Fluocinolone] Swelling    lips  . Azithromycin Itching and Rash  . Food Itching and Rash    "Greek yogurt"    Medications Prior to Admission  Medication Sig Dispense Refill  . amitriptyline (ELAVIL) 100 MG tablet Take 100 mg by mouth at bedtime.      Marland Kitchen atorvastatin (LIPITOR) 80 MG tablet Take 80 mg by mouth daily.      . carvedilol (COREG) 6.25 MG tablet Take 6.25 mg by mouth 2 (two) times daily with a meal.      . gabapentin (NEURONTIN) 300 MG capsule Take 600 mg by mouth 3 (three) times daily.       . insulin aspart (NOVOLOG FLEXPEN) 100 UNIT/ML SOPN FlexPen Inject 10-20 Units into the skin 3 (three) times daily with meals. Sliding scale      . Liraglutide (VICTOZA) 18 MG/3ML SOPN Inject 1.8 mg into the skin daily.      Marland Kitchen lisinopril (PRINIVIL,ZESTRIL) 40 MG tablet Take 40 mg by mouth daily.      . metFORMIN (GLUCOPHAGE) 1000 MG tablet Take 1,000 mg by mouth 2 (two) times daily with  a meal.      . oxycodone (ROXICODONE) 30 MG immediate release tablet Take 1 tablet (30 mg total) by mouth every 4 (four) hours as needed for pain.  15 tablet  0  . SUMAtriptan (IMITREX) 50 MG tablet Take 50 mg by mouth every 2 (two) hours as needed for migraine or headache. May repeat in 2 hours if headache persists or recurs.      Marland Kitchen tiZANidine (ZANAFLEX) 4 MG tablet Take 4 mg by mouth 3 (three) times daily.      Marland Kitchen topiramate (TOPAMAX) 100 MG tablet Take 100 mg by mouth daily.      Marland Kitchen topiramate (TOPAMAX) 25 MG tablet Take 25 mg by mouth daily.      . nitroGLYCERIN (NITROSTAT) 0.4 MG SL tablet Place 0.4 mg under the tongue every 5 (five) minutes as needed for chest pain.        Results for orders placed  during the hospital encounter of 10/29/13 (from the past 48 hour(s))  GLUCOSE, CAPILLARY     Status: None   Collection Time    10/29/13  8:04 AM      Result Value Ref Range   Glucose-Capillary 90  70 - 99 mg/dL   No results found.  Review of Systems  Constitutional: Negative.   HENT: Negative.   Eyes: Negative.   Respiratory: Negative.   Cardiovascular: Negative.   Gastrointestinal: Negative.   Genitourinary: Negative.   Musculoskeletal: Positive for back pain and myalgias.  Skin: Negative.   Neurological: Positive for sensory change.    Blood pressure 121/83, pulse 101, temperature 98.3 F (36.8 C), temperature source Oral, resp. rate 18, SpO2 100.00%. Physical Exam  Constitutional: She is oriented to person, place, and time. She appears well-developed and well-nourished.  HENT:  Head: Normocephalic.  Eyes: Pupils are equal, round, and reactive to light.  Neck: Normal range of motion.  Respiratory: Effort normal and breath sounds normal.  GI: Soft. Bowel sounds are normal.  Neurological: She is alert and oriented to person, place, and time. GCS eye subscore is 4. GCS verbal subscore is 5. GCS motor subscore is 6.  Strength in the right lower extremity is 5 of 5 left lower extremity has weakness in her EHL at 4 minus out of 5 dorsiflexion appears to be 4+ out of 5  Skin: Skin is warm and dry.     Assessment/Plan 38 year female presents for a left-sided L5-S1 laminectomy for resection of synovial cyst  Marissa [name] P 10/29/2013, 10:08 AM

## 2013-10-30 LAB — GLUCOSE, CAPILLARY
GLUCOSE-CAPILLARY: 397 mg/dL — AB (ref 70–99)
GLUCOSE-CAPILLARY: 336 mg/dL — AB (ref 70–99)
Glucose-Capillary: 199 mg/dL — ABNORMAL HIGH (ref 70–99)
Glucose-Capillary: 210 mg/dL — ABNORMAL HIGH (ref 70–99)

## 2013-10-30 MED ORDER — GABAPENTIN 300 MG PO CAPS: 300.0000 mg | ORAL_CAPSULE | Freq: Two times a day (BID) | ORAL | Status: DC

## 2013-10-30 MED ORDER — INSULIN ASPART 100 UNIT/ML ~~LOC~~ SOLN
8.0000 [IU] | Freq: Three times a day (TID) | SUBCUTANEOUS | Status: DC
  Administered 2013-10-30 – 2013-11-01 (×4): 8 [IU] via SUBCUTANEOUS

## 2013-10-30 MED ORDER — INSULIN DETEMIR 100 UNIT/ML ~~LOC~~ SOLN
15.0000 [IU] | Freq: Every day | SUBCUTANEOUS | Status: DC
  Administered 2013-10-30 – 2013-10-31 (×2): 15 [IU] via SUBCUTANEOUS
  Filled 2013-10-30 (×3): qty 0.15

## 2013-10-30 MED ORDER — OXYCODONE HCL 5 MG PO TABS
30.0000 mg | ORAL_TABLET | ORAL | Status: DC | PRN
  Administered 2013-10-30 – 2013-11-01 (×7): 30 mg via ORAL
  Filled 2013-10-30 (×7): qty 6

## 2013-10-30 MED ORDER — OXYCODONE HCL 30 MG PO TABS: 30.0000 mg | ORAL_TABLET | ORAL | Status: DC | PRN

## 2013-10-30 MED ORDER — SODIUM CHLORIDE 0.9 % IV SOLN
INTRAVENOUS | Status: DC
  Administered 2013-10-30: 1000 mL via INTRAVENOUS
  Administered 2013-10-31: 22:00:00 via INTRAVENOUS

## 2013-10-30 MED ORDER — SODIUM CHLORIDE 0.9 % IV BOLUS (SEPSIS)
250.0000 mL | Freq: Once | INTRAVENOUS | Status: AC
  Administered 2013-10-30: 250 mL via INTRAVENOUS

## 2013-10-30 MED ORDER — SODIUM CHLORIDE 0.9 % IV BOLUS (SEPSIS): 250.0000 mL | Freq: Once | INTRAVENOUS | Status: DC

## 2013-10-30 MED ORDER — SODIUM CHLORIDE 0.9 % IV BOLUS (SEPSIS)
1000.0000 mL | Freq: Once | INTRAVENOUS | Status: AC
  Administered 2013-10-30: 1000 mL via INTRAVENOUS

## 2013-10-30 NOTE — Discharge Summary (Signed)
Physician Discharge Summary  Patient ID: Marissa [name] MRN: [id number] DOB/AGE: 1976-04-22 38 y.o.  Admit date: 10/29/2013 Discharge date: 10/30/2013  Admission Diagnoses: Left S1 radiculopathy from recurrent herniated disc L5-S1 left  Discharge Diagnoses: Same Active Problems:   HNP (herniated nucleus pulposus), lumbar   Discharged Condition: good  Hospital Course: Patient to the hospital underwent a redo L5-S1 laminectomy laminectomy microdiscectomy postop patient still had a lot of leg pain it became under progressively better control a 24 hours postop had some difficulty with initial voiding and with her sugar control. A lot of the diabetic control is related to her diet. Adjustments were made in her insulin as well as her voiding eventually when it corrected itself she would be stable for discharge.  Consults: Significant Diagnostic Studies: Treatments: Redo L5-S1 laminectomy microdiscectomy Discharge Exam: Blood pressure 127/88, pulse 102, temperature 97.7 F (36.5 C), temperature source Oral, resp. rate 18, SpO2 93.00%. Strength 5 out of 5 in her right lower extremity left lower extremity has a baseline dorsiflexion and EHL weakness  Disposition: Home     Medication List         amitriptyline 100 MG tablet  Commonly known as:  ELAVIL  Take 100 mg by mouth at bedtime.     atorvastatin 80 MG tablet  Commonly known as:  LIPITOR  Take 80 mg by mouth daily.     carvedilol 6.25 MG tablet  Commonly known as:  COREG  Take 6.25 mg by mouth 2 (two) times daily with a meal.     gabapentin 300 MG capsule  Commonly known as:  NEURONTIN  Take 600 mg by mouth 3 (three) times daily.     lisinopril 40 MG tablet  Commonly known as:  PRINIVIL,ZESTRIL  Take 40 mg by mouth daily.     metFORMIN 1000 MG tablet  Commonly known as:  GLUCOPHAGE  Take 1,000 mg by mouth 2 (two) times daily with a meal.     nitroGLYCERIN 0.4 MG SL tablet  Commonly known as:  NITROSTAT  Place 0.4  mg under the tongue every 5 (five) minutes as needed for chest pain.     NOVOLOG FLEXPEN 100 UNIT/ML FlexPen  Generic drug:  insulin aspart  Inject 10-20 Units into the skin 3 (three) times daily with meals. Sliding scale     oxycodone 30 MG immediate release tablet  Commonly known as:  ROXICODONE  Take 1 tablet (30 mg total) by mouth every 4 (four) hours as needed for pain.     oxycodone 30 MG immediate release tablet  Commonly known as:  ROXICODONE  Take 1 tablet (30 mg total) by mouth every 3 (three) hours as needed for moderate pain.     SUMAtriptan 50 MG tablet  Commonly known as:  IMITREX  Take 50 mg by mouth every 2 (two) hours as needed for migraine or headache. May repeat in 2 hours if headache persists or recurs.     tiZANidine 4 MG tablet  Commonly known as:  ZANAFLEX  Take 4 mg by mouth 3 (three) times daily.     topiramate 100 MG tablet  Commonly known as:  TOPAMAX  Take 100 mg by mouth daily.     topiramate 25 MG tablet  Commonly known as:  TOPAMAX  Take 25 mg by mouth daily.     VICTOZA 18 MG/3ML Sopn  Generic drug:  Liraglutide  Inject 1.8 mg into the skin daily.           Follow-up  Information   Follow up with [facility], MD.   Specialty:  Neurosurgery   Contact information:   [address] [place] [address] [phone]       Signed: Kimberley Speece P 10/30/2013, 4:25 PM

## 2013-10-30 NOTE — Progress Notes (Addendum)
Inpatient Diabetes Program Recommendations  AACE/ADA: New Consensus Statement on Inpatient Glycemic Control (2013)  Target Ranges:  Prepandial:   less than 140 mg/dL      Peak postprandial:   less than 180 mg/dL (1-2 hours)      Critically ill patients:  140 - 180 mg/dL   Results for Marissa [name], Marissa [name] (MRN [id number]) as of 10/30/2013 12:02  Ref. Range 10/29/2013 08:04 10/29/2013 12:51 10/29/2013 17:07 10/29/2013 21:30 10/30/2013 08:54  Glucose-Capillary Latest Range: 70-99 mg/dL 90 115 (H) 237 (H) 295 (H) 397 (H)   Diabetes history: DM2 Outpatient Diabetes medications: Novolog 10-20 units TID with meals, Victoza 1.8 mg daily, and Metformin 1000 mg BID Current orders for Inpatient glycemic control: Victoza 1.8 mg daily, Metformin 1000 mg BID, Novolog 0-20 units AC, Novolog 0-5 units HS, Novolog 4 units TID with meals  Inpatient Diabetes Program Recommendations Insulin - Basal: Please consider ordering low dose basal insulin especially since patient is ordered Decadron 10 mg Q6H. Recommend starting with Levemir 15 units Q24H (starting now). Insulin - Meal Coverage: Please consider increasing Novolog meal coverage to 8 units TID with meals (especially since patient is on steroids). HgbA1C: Please order an A1C to evaluate glycemic control over the past 2-3 months.  Thanks, Barnie Alderman, RN, MSN, CCRN Diabetes Coordinator Inpatient Diabetes Program [phone] (Team Pager) [phone] ([facility] office) [phone] [facility] office)

## 2013-10-30 NOTE — Progress Notes (Signed)
Patients BP low(70's/40's) and pt complaining of being lightheaded, NS bolus given per on call MD and patient placed on trendelenburg position. F/U BP(80's-90's/40's). NAD noted. Will continue to monitor.

## 2013-10-30 NOTE — Progress Notes (Signed)
Subjective: Patient reports Still having a lot of back and posterior leg pain she reports it is similar to what she had before surgery just more intense at times she denies any new numbness or tingling in her leg or foot she says at its been behaving like this prior to surgery with her pain management doctor as well.  Objective: Vital signs in last 24 hours: Temp:  [97.4 F (36.3 C)-98.4 F (36.9 C)] 97.5 F (36.4 C) (03/19 0411) Pulse Rate:  [88-114] 114 (03/19 0513) Resp:  [14-22] 22 (03/19 0513) BP: (74-145)/(41-96) 126/91 mmHg (03/19 0513) SpO2:  [93 %-100 %] 94 % (03/19 0411)  Intake/Output from previous day: 03/18 0701 - 03/19 0700 In: 2040 [P.O.:240; I.V.:1800] Out: 940 [Urine:900; Blood:40] Intake/Output this shift:    Neurologically at her baseline she maintains with pain limited weakness in the left lower extremity dorsiflexion remains 4 minus out of 5 EHL 4 minus out of 5 this is stable from preop sensation decreased in her foot stable from preop  Lab Results: No results found for this basename: WBC, HGB, HCT, PLT,  in the last 72 hours BMET No results found for this basename: NA, K, CL, CO2, GLUCOSE, BUN, CREATININE, CALCIUM,  in the last 72 hours  Studies/Results: Dg Lumbar Spine 2-3 Views  10/29/2013   CLINICAL DATA:  L5-S1 laminectomy.  EXAM: LUMBAR SPINE - 2-3 VIEW  COMPARISON:  DG LUMBAR SPINE COMPLETE 4+V dated 09/11/2013; MR LUMBAR SPINE WO/W CM dated 09/10/2013  FINDINGS: Two cross-table lateral views from the operating room are submitted postoperatively. The office radiographs demonstrate transitional lumbosacral anatomy. The last open disc space is assigned L5-S1 as on the MRI. View #1 at 1105 hr demonstrates skin spreaders posterior to the upper sacrum with a blunt surgical instrument overlying the upper sacrum. On the second view at 1136 hr, the surgical instrument is directed towards the posterior aspect of the L5-S1 disc.  IMPRESSION: Intraoperative localization  as described.   Electronically Signed   By: Camie Patience M.D.   On: 10/29/2013 15:40    Assessment/Plan: Continue progressive mobilization today physical occupational therapy continued IV Decadron we'll add gabapentin. Judicious use of pain medication with relative hypotension  LOS: 1 day     Rifky Lapre P 10/30/2013, 7:32 AM

## 2013-10-31 ENCOUNTER — Ambulatory Visit (HOSPITAL_COMMUNITY): Payer: Medicare HMO

## 2013-10-31 ENCOUNTER — Encounter (HOSPITAL_COMMUNITY): Payer: Self-pay | Admitting: Neurosurgery

## 2013-10-31 DIAGNOSIS — M5126 Other intervertebral disc displacement, lumbar region: Principal | ICD-10-CM

## 2013-10-31 LAB — GLUCOSE, CAPILLARY
GLUCOSE-CAPILLARY: 207 mg/dL — AB (ref 70–99)
GLUCOSE-CAPILLARY: 124 mg/dL — AB (ref 70–99)
Glucose-Capillary: 190 mg/dL — ABNORMAL HIGH (ref 70–99)

## 2013-10-31 LAB — BASIC METABOLIC PANEL
BUN: 35 mg/dL — ABNORMAL HIGH (ref 6–23)
CHLORIDE: 107 meq/L (ref 96–112)
CO2: 21 meq/L (ref 19–32)
Calcium: 8.4 mg/dL (ref 8.4–10.5)
Creatinine, Ser: 1.09 mg/dL (ref 0.50–1.10)
GFR calc non Af Amer: 64 mL/min — ABNORMAL LOW (ref >90)
GFR, EST AFRICAN AMERICAN: 74 mL/min — AB (ref >90)
Glucose, Bld: 176 mg/dL — ABNORMAL HIGH (ref 70–99)
POTASSIUM: 4.4 meq/L (ref 3.7–5.3)
Sodium: 139 mEq/L (ref 137–147)

## 2013-10-31 LAB — CBC
HCT: 30.6 % — ABNORMAL LOW (ref 36.0–46.0)
Hemoglobin: 9.8 g/dL — ABNORMAL LOW (ref 12.0–15.0)
MCH: 28.2 pg (ref 26.0–34.0)
MCHC: 32 g/dL (ref 30.0–36.0)
MCV: 88.2 fL (ref 78.0–100.0)
Platelets: 168 10*3/uL (ref 150–400)
RBC: 3.47 MIL/uL — AB (ref 3.87–5.11)
RDW: 15.4 % (ref 11.5–15.5)
WBC: 7.2 10*3/uL (ref 4.0–10.5)

## 2013-10-31 LAB — PRO B NATRIURETIC PEPTIDE: PRO B NATRI PEPTIDE: 253.7 pg/mL — AB (ref 0–125)

## 2013-10-31 LAB — TROPONIN I

## 2013-10-31 MED ORDER — LOPERAMIDE HCL 2 MG PO CAPS
2.0000 mg | ORAL_CAPSULE | Freq: Once | ORAL | Status: AC
  Administered 2013-10-31: 2 mg via ORAL
  Filled 2013-10-31: qty 1

## 2013-10-31 MED ORDER — LACTATED RINGERS IV BOLUS (SEPSIS): 500.0000 mL | Freq: Once | INTRAVENOUS | Status: DC

## 2013-10-31 MED ORDER — AMITRIPTYLINE HCL 100 MG PO TABS
100.0000 mg | ORAL_TABLET | Freq: Every evening | ORAL | Status: DC | PRN
  Filled 2013-10-31: qty 1

## 2013-10-31 NOTE — Progress Notes (Signed)
Pt is very lethargic. [name] vitals were BP: 74/48, pulse: 87, temp: 98.6, O2: 98% RA. [name] CBG was checked and was 207. Dr. [name] notified and orders were given. Also at this time Pt was still unable to void after 7 hours post I&O x 3, Foley cath was placed per protocol. Medical MD was called to see Pt by Dr. [name]. Will continue to monitor Pt. Holli Humbles, RN

## 2013-10-31 NOTE — Progress Notes (Signed)
Patient ID: Marissa [name], female   DOB: 08-03-1976, 38 y.o.   MRN: [id number] Doing better this morning with pain setup in a chair looks more comfortable  Strength 4 minus out of 5 left foot baseline , wound clean and dry  Continue to wait voiding which is a [employer] at night she's unable to void by the next 6 hour block leave the catheter in place observed 24 hours and then remove again

## 2013-10-31 NOTE — Progress Notes (Signed)
Pt is awake and alert, vitals are improving with BP 96/61, pulse 108. Report called to 2W and Pt was transferred to [id number] tele bed per MD order. Holli Humbles, RN

## 2013-10-31 NOTE — Consult Note (Signed)
[facility] Medical Consultation  Marissa [name] [id number] DOB: 07-22-1976 DOA: 10/29/2013 PCP: PROVIDER NOT IN SYSTEM   Requesting physician: Dr. [name]  Date of consultation: 10/31/2013 Reason for consultation: Lethargy and hypotension   Impression/Recommendations Active Problems:   HNP (herniated nucleus pulposus), lumbar - per primary team   Lethargy  - appears to be related to use of several muscle relaxants, Flexerill (last dose at 2 am), Zanaflex (scheduled TID) - pt also on Neurontin and Elavil and both can be rather sedating medications - for now will hold all sedating medications and will plan on slowly reintroducing as pt becomes more alert and once BP improves   Hypotension - holding sedating medications as noted above - also hold Lisinopril and Coreg - IVF as indicated   Oliguria - foley placed - monitor strict I's and O's - also hold benadryl   Diabetes type II - hold Metformin and place on SSI for now   Diabetic neuropathy  - once pt alert may restart low dose such as 100 mg cap BID or TID  I will followup again tomorrow. Please contact me if I can be of assistance in the meanwhile. Thank you for this consultation.  Chief Complaint: lethargy, hypotension   HPI:  Pt is 38 yo female with HTN, HLD, DM, chronic diabetic neuropathy, admitted to [facility] 10/29/2013 for further evaluation of progressively worsening back and left leg pain, consistent with an S1 nerve root involvement, now status post L5-S1 laminectomy microdiscectomy. Post op with persistent left leg pain, noted to be hypotensive earlier today 3/20, with SBP in 70's and requiring NS boluses with minimal response. Also noted to have minimal urine output after multiple fluid boluses, lethargic. Unable to discharge and TR[facility] asked to provide additional recommendations.    Review of Systems:  Unable to obtain as pt rather lethargic on exam   Past Medical History  Diagnosis Date  . Hypertension   .  Coronary artery disease   . Neuropathy   . Headache(784.0)   . Arthritis   . Vision loss     due to diabetes  . Myocardial infarction 2006    "due to medication"; no evidence of ischemia or infarction by nuclear stress test '11  . Dysrhythmia     tachycardia  . Diabetes mellitus without complication     fasting cbg 50-140s  . GERD (gastroesophageal reflux disease)     otc meds  . Migraines     once/month maybe - can last up to two weeks  . Hyperlipemia    Past Surgical History  Procedure Laterality Date  . Back surgery  2011    Lumbar   . Cervical fusion  2006  . Salpingoophorectomy Bilateral     1 1997. 2nd 2001  . Cardiac catheterization  07/18/2004    50-60% mid LAD, minor luminal irregularities RCA, normal LM and CX, EF 50-55% [facility])  . Lumbar laminectomy/decompression microdiscectomy Right 05/07/2013    Procedure: Right Lumbar one-two laminectomy;  Surgeon: Marissa Hoops, MD;  Location: [place] [facility];  Service: Neurosurgery;  Laterality: Right;  . Carpel tunnel Bilateral   . Lumbar laminectomy/decompression microdiscectomy Left 10/29/2013    Procedure: Left Lumbar five-Sacral one Laminectomy;  Surgeon: Marissa Hoops, MD;  Location: [place] [facility];  Service: Neurosurgery;  Laterality: Left;   Social History:  reports that she has never smoked. She does not have any smokeless tobacco history on file. She reports that she does not drink alcohol or use illicit  drugs.  Allergies  Allergen Reactions  . Ciprocinonide [Fluocinolone] Swelling    lips  . Azithromycin Itching and Rash  . Food Itching and Rash    "[place] yogurt"   Unable to obtain of family medical history as pt is lethargic   Prior to Admission medications   Medication Sig Start Date End Date Taking? Authorizing Provider  amitriptyline (ELAVIL) 100 MG tablet Take 100 mg by mouth at bedtime.   Yes Historical Provider, MD  atorvastatin (LIPITOR) 80 MG tablet Take 80 mg by mouth daily.   Yes Historical  Provider, MD  carvedilol (COREG) 6.25 MG tablet Take 6.25 mg by mouth 2 (two) times daily with a meal.   Yes Historical Provider, MD  gabapentin (NEURONTIN) 300 MG capsule Take 600 mg by mouth 3 (three) times daily.    Yes Historical Provider, MD  insulin aspart (NOVOLOG FLEXPEN) 100 UNIT/ML SOPN FlexPen Inject 10-20 Units into the skin 3 (three) times daily with meals. Sliding scale   Yes Historical Provider, MD  Liraglutide (VICTOZA) 18 MG/3ML SOPN Inject 1.8 mg into the skin daily.   Yes Historical Provider, MD  lisinopril (PRINIVIL,ZESTRIL) 40 MG tablet Take 40 mg by mouth daily.   Yes Historical Provider, MD  metFORMIN (GLUCOPHAGE) 1000 MG tablet Take 1,000 mg by mouth 2 (two) times daily with a meal.   Yes Historical Provider, MD  oxycodone (ROXICODONE) 30 MG immediate release tablet Take 1 tablet (30 mg total) by mouth every 4 (four) hours as needed for pain. 05/10/13  Yes Nicole Pisciotta, PA-C  SUMAtriptan (IMITREX) 50 MG tablet Take 50 mg by mouth every 2 (two) hours as needed for migraine or headache. May repeat in 2 hours if headache persists or recurs.   Yes Historical Provider, MD  tiZANidine (ZANAFLEX) 4 MG tablet Take 4 mg by mouth 3 (three) times daily.   Yes Historical Provider, MD  topiramate (TOPAMAX) 100 MG tablet Take 100 mg by mouth daily.   Yes Historical Provider, MD  topiramate (TOPAMAX) 25 MG tablet Take 25 mg by mouth daily.   Yes Historical Provider, MD  nitroGLYCERIN (NITROSTAT) 0.4 MG SL tablet Place 0.4 mg under the tongue every 5 (five) minutes as needed for chest pain.    Historical Provider, MD  oxyCODONE (ROXICODONE) 30 MG immediate release tablet Take 1 tablet (30 mg total) by mouth every 3 (three) hours as needed for moderate pain. 10/30/13   Marissa Hoops, MD   Physical Exam: Blood pressure 105/66, pulse 81, temperature 98.3 F (36.8 C), temperature source Oral, resp. rate 18, SpO2 95.00%. Filed Vitals:   10/31/13 1758  BP: 105/66  Pulse: 81  Temp: 98.3 F  (36.8 C)  Resp: 18   Physical Exam  Constitutional: Appears lethargic but opens eyes when called by name, follows commands appropriately  HENT: Normocephalic. External right and left ear normal. Oropharynx is clear and moist.  Eyes: Conjunctivae and EOM are normal. PERRLA, no scleral icterus.  Neck: Normal ROM. Neck supple. No JVD. No tracheal deviation. No thyromegaly.  CVS: RRR, S1/S2 +, no murmurs, no gallops, no carotid bruit.  Pulmonary: Effort and breath sounds normal, no stridor, diminished breath sounds at bases  Abdominal: Soft. BS +,  no distension, tenderness, rebound or guarding.  Musculoskeletal: No edema and no tenderness.  Lymphadenopathy: No lymphadenopathy noted, cervical, inguinal. Neuro: Lethargic but able to open her eyes when talking to her and follows commands appropriately  Skin: Skin is warm and dry. No rash noted. Not diaphoretic.  No erythema. No pallor.  Psychiatric: Unable to assess due to lethargy    Labs on Admission:  Basic Metabolic Panel:  Recent Labs Lab 10/31/13 1446  NA 139  K 4.4  CL 107  CO2 21  GLUCOSE 176*  BUN 35*  CREATININE 1.09  CALCIUM 8.4   CBC:  Recent Labs Lab 10/31/13 1446  WBC 7.2  HGB 9.8*  HCT 30.6*  MCV 88.2  PLT 168   Cardiac Enzymes:  Recent Labs Lab 10/31/13 1446  TROPONINI <0.30   CBG:  Recent Labs Lab 10/30/13 1259 10/30/13 1751 10/30/13 2154 10/31/13 0810 10/31/13 1239  GLUCAP 336* 199* 210* 190* 207*    Radiological Exams on Admission: Dg Chest Port 1 View   10/31/2013 There is bilateral pulmonary hypo inflation which may be related to the patient's lethargy. No definite pneumonia nor CHF is demonstrated.    EKG: not done   Time spent: 60 minutes   Faye Ramsay [facility] Pager [phone] Cell [phone]  If 7PM-7AM, please contact night-coverage [email or web address] Password [facility] 10/31/2013, 6:39 PM

## 2013-11-01 DIAGNOSIS — M549 Dorsalgia, unspecified: Secondary | ICD-10-CM | POA: Diagnosis present

## 2013-11-01 LAB — CLOSTRIDIUM DIFFICILE BY PCR: Toxigenic C. Difficile by PCR: NEGATIVE

## 2013-11-01 LAB — GLUCOSE, CAPILLARY: GLUCOSE-CAPILLARY: 203 mg/dL — AB (ref 70–99)

## 2013-11-01 MED ORDER — CYCLOBENZAPRINE HCL 10 MG PO TABS: 10.0000 mg | ORAL_TABLET | Freq: Three times a day (TID) | ORAL | Status: DC | PRN

## 2013-11-01 MED ORDER — OXYCODONE HCL 30 MG PO TABS: 30.0000 mg | ORAL_TABLET | Freq: Four times a day (QID) | ORAL | Status: DC | PRN

## 2013-11-01 NOTE — Discharge Summary (Signed)
Physician Discharge Summary  Patient ID: Marissa [name] MRN: [id number] DOB/AGE: [birth year] 38 y.o.  Admit date: 10/29/2013 Discharge date: 11/01/2013  Admission Diagnoses: HNP   Discharge Diagnoses: same   Discharged Condition: good  Hospital Course: The patient was admitted on 10/29/2013 and taken to the operating room where the patient underwent LL. The patient tolerated the procedure well and was taken to the recovery room and then to the floor in stable condition. The hospital course was complicated by  Urinary retention and hypotension which resolved. Hospitalists help was greatly appreciated.The wound remained clean dry and intact. Pt had appropriate back soreness. No complaints of leg pain or new N/T/W. The patient remained afebrile with stable vital signs, and tolerated a regular diet. The patient continued to increase activities, and pain was well controlled with oral pain medications.   Consults: medicine  Significant Diagnostic Studies:  Results for orders placed during the hospital encounter of 10/29/13  CLOSTRIDIUM DIFFICILE BY PCR      Result Value Ref Range   C difficile by pcr NEGATIVE  NEGATIVE  GLUCOSE, CAPILLARY      Result Value Ref Range   Glucose-Capillary 90  70 - 99 mg/dL  GLUCOSE, CAPILLARY      Result Value Ref Range   Glucose-Capillary 115 (*) 70 - 99 mg/dL  GLUCOSE, CAPILLARY      Result Value Ref Range   Glucose-Capillary 237 (*) 70 - 99 mg/dL  GLUCOSE, CAPILLARY      Result Value Ref Range   Glucose-Capillary 295 (*) 70 - 99 mg/dL  GLUCOSE, CAPILLARY      Result Value Ref Range   Glucose-Capillary 397 (*) 70 - 99 mg/dL   Comment 1 Notify RN     Comment 2 Documented in Chart    GLUCOSE, CAPILLARY      Result Value Ref Range   Glucose-Capillary 336 (*) 70 - 99 mg/dL   Comment 1 Notify RN     Comment 2 Documented in Chart    GLUCOSE, CAPILLARY      Result Value Ref Range   Glucose-Capillary 199 (*) 70 - 99 mg/dL   Comment 1 Notify RN      Comment 2 Documented in Chart    GLUCOSE, CAPILLARY      Result Value Ref Range   Glucose-Capillary 210 (*) 70 - 99 mg/dL  GLUCOSE, CAPILLARY      Result Value Ref Range   Glucose-Capillary 190 (*) 70 - 99 mg/dL  GLUCOSE, CAPILLARY      Result Value Ref Range   Glucose-Capillary 207 (*) 70 - 99 mg/dL   Comment 1 Notify RN     Comment 2 Documented in Chart    PRO B NATRIURETIC PEPTIDE      Result Value Ref Range   Pro B Natriuretic peptide (BNP) 253.7 (*) 0 - 125 pg/mL  CBC      Result Value Ref Range   WBC 7.2  4.0 - 10.5 K/uL   RBC 3.47 (*) 3.87 - 5.11 MIL/uL   Hemoglobin 9.8 (*) 12.0 - 15.0 g/dL   HCT 30.6 (*) 36.0 - 46.0 %   MCV 88.2  78.0 - 100.0 fL   MCH 28.2  26.0 - 34.0 pg   MCHC 32.0  30.0 - 36.0 g/dL   RDW 15.4  11.5 - 15.5 %   Platelets 168  150 - 400 K/uL  BASIC METABOLIC PANEL      Result Value Ref Range   Sodium 139  137 -  147 mEq/L   Potassium 4.4  3.7 - 5.3 mEq/L   Chloride 107  96 - 112 mEq/L   CO2 21  19 - 32 mEq/L   Glucose, Bld 176 (*) 70 - 99 mg/dL   BUN 35 (*) 6 - 23 mg/dL   Creatinine, Ser 1.09  0.50 - 1.10 mg/dL   Calcium 8.4  8.4 - 10.5 mg/dL   GFR calc non Af Amer 64 (*) >90 mL/min   GFR calc Af Amer 74 (*) >90 mL/min  TROPONIN I      Result Value Ref Range   Troponin I <0.30  <0.30 ng/mL  GLUCOSE, CAPILLARY      Result Value Ref Range   Glucose-Capillary 124 (*) 70 - 99 mg/dL  GLUCOSE, CAPILLARY      Result Value Ref Range   Glucose-Capillary 203 (*) 70 - 99 mg/dL    Dg Lumbar Spine 2-3 Views  10/29/2013   CLINICAL DATA:  L5-S1 laminectomy.  EXAM: LUMBAR SPINE - 2-3 VIEW  COMPARISON:  DG LUMBAR SPINE COMPLETE 4+V dated 09/11/2013; MR LUMBAR SPINE WO/W CM dated 09/10/2013  FINDINGS: Two cross-table lateral views from the operating room are submitted postoperatively. The office radiographs demonstrate transitional lumbosacral anatomy. The last open disc space is assigned L5-S1 as on the MRI. View #1 at 1105 hr demonstrates skin spreaders  posterior to the upper sacrum with a blunt surgical instrument overlying the upper sacrum. On the second view at 1136 hr, the surgical instrument is directed towards the posterior aspect of the L5-S1 disc.  IMPRESSION: Intraoperative localization as described.   Electronically Signed   By: Camie Patience M.D.   On: 10/29/2013 15:40   Dg Chest Port 1 View  10/31/2013   CLINICAL DATA:  Lethargy  EXAM: PORTABLE CHEST - 1 VIEW  COMPARISON:  CT ANGIO CHEST dated 09/17/2013; DG CHEST 2V dated 09/17/2013  FINDINGS: There is marked bilateral pulmonary hypo inflation. The interstitial markings are coarse. There is no discrete infiltrate. The cardiac silhouette is enlarged. The pulmonary vascularity is not clearly engorged. The observed portions of the bony thorax exhibit no acute abnormalities.  IMPRESSION: There is bilateral pulmonary hypo inflation which may be related to the patient's lethargy. No definite pneumonia nor CHF is demonstrated.   Electronically Signed   By: Fiora Weill  [place]   On: 10/31/2013 14:59    Antibiotics:  Anti-infectives   Start     Dose/Rate Route Frequency Ordered Stop   10/29/13 1800  ceFAZolin (ANCEF) IVPB 1 g/50 mL premix     1 g 100 mL/hr over 30 Minutes Intravenous Every 8 hours 10/29/13 1456 10/30/13 0140   10/29/13 1050  bacitracin 50,000 Units in sodium chloride irrigation 0.9 % 500 mL irrigation  Status:  Discontinued       As needed 10/29/13 1051 10/29/13 1225   10/29/13 0600  ceFAZolin (ANCEF) IVPB 2 g/50 mL premix     2 g 100 mL/hr over 30 Minutes Intravenous On call to O.R. 10/28/13 1343 10/29/13 1041      Discharge Exam: Blood pressure 115/67, pulse 103, temperature 98.1 F (36.7 C), temperature source Oral, resp. rate 18, SpO2 95.00%. Neurologic: Grossly normal Incision ok  Discharge Medications:     Medication List    STOP taking these medications       carvedilol 6.25 MG tablet  Commonly known as:  COREG     lisinopril 40 MG tablet  Commonly known as:   PRINIVIL,ZESTRIL      TAKE  these medications       amitriptyline 100 MG tablet  Commonly known as:  ELAVIL  Take 100 mg by mouth at bedtime.     atorvastatin 80 MG tablet  Commonly known as:  LIPITOR  Take 80 mg by mouth daily.     cyclobenzaprine 10 MG tablet  Commonly known as:  FLEXERIL  Take 1 tablet (10 mg total) by mouth 3 (three) times daily as needed for muscle spasms.     gabapentin 300 MG capsule  Commonly known as:  NEURONTIN  Take 600 mg by mouth 3 (three) times daily.     metFORMIN 1000 MG tablet  Commonly known as:  GLUCOPHAGE  Take 1,000 mg by mouth 2 (two) times daily with a meal.     nitroGLYCERIN 0.4 MG SL tablet  Commonly known as:  NITROSTAT  Place 0.4 mg under the tongue every 5 (five) minutes as needed for chest pain.     NOVOLOG FLEXPEN 100 UNIT/ML FlexPen  Generic drug:  insulin aspart  Inject 10-20 Units into the skin 3 (three) times daily with meals. Sliding scale     oxycodone 30 MG immediate release tablet  Commonly known as:  ROXICODONE  Take 1 tablet (30 mg total) by mouth every 3 (three) hours as needed for moderate pain.     oxycodone 30 MG immediate release tablet  Commonly known as:  ROXICODONE  Take 1 tablet (30 mg total) by mouth every 6 (six) hours as needed for pain.     SUMAtriptan 50 MG tablet  Commonly known as:  IMITREX  Take 50 mg by mouth every 2 (two) hours as needed for migraine or headache. May repeat in 2 hours if headache persists or recurs.     tiZANidine 4 MG tablet  Commonly known as:  ZANAFLEX  Take 4 mg by mouth 3 (three) times daily.     topiramate 100 MG tablet  Commonly known as:  TOPAMAX  Take 100 mg by mouth daily.     topiramate 25 MG tablet  Commonly known as:  TOPAMAX  Take 25 mg by mouth daily.     VICTOZA 18 MG/3ML Sopn  Generic drug:  Liraglutide  Inject 1.8 mg into the skin daily.        Disposition: home   Final Dx: LL for disk      Discharge Orders   Future Orders Complete By  Expires   Call MD for:  difficulty breathing, headache or visual disturbances  As directed    Call MD for:  persistant nausea and vomiting  As directed    Call MD for:  redness, tenderness, or signs of infection (pain, swelling, redness, odor or green/yellow discharge around incision site)  As directed    Call MD for:  severe uncontrolled pain  As directed    Call MD for:  temperature >100.4  As directed    Diet - low sodium heart healthy  As directed    Discharge instructions  As directed    Comments:     No bending or twisting, no heavy lifting, no stenuous activity   Increase activity slowly  As directed    Remove dressing in 48 hours  As directed       Follow-up Information   Follow up with [facility] P, MD.   Specialty:  Neurosurgery   Contact information:   [address] [place] [address] [phone]       Schedule an appointment as  soon as possible for a visit with Faye Ramsay, MD. (call my cell with other questions or if you are having difficulty scheduling an appointment, [phone])    Specialty:  Internal Medicine   Contact information:   [address] [address] [phone]       Follow up with CRAM,GARY P, MD. Schedule an appointment as soon as possible for a visit in 2 weeks.   Specialty:  Neurosurgery   Contact information:   [address] [place] [address] [phone]        Signed: Eustace Moore 11/01/2013, 11:40 AM

## 2013-11-01 NOTE — Discharge Instructions (Signed)
No lifting no bending no twisting no driving a riding a car less she's come back and forth to see me. Keep incision clean and dry.  Stop taking Coreg and Lisinopril until you see primary care provider. You can see Dr. Doyle Askew at the [facility] (call [phone] to schedule an appointment).  Check bloop pressure regularly to make sure that the numbers are < 140/90. If the BP is > 140/90 call your doctor.

## 2013-11-01 NOTE — Progress Notes (Signed)
Patient ID: Marissa [name], female   DOB: Dec 01, 1975, 38 y.o.   MRN: [id number]  [facility] PROGRESS NOTE  Marissa [name] [id number] DOB: 12-03-1975 DOA: 10/29/2013 PCP: PROVIDER NOT IN SYSTEM  Brief narrative: Pt is 38 yo female with HTN, HLD, DM, chronic diabetic neuropathy, admitted to [facility] 10/29/2013 for further evaluation of progressively worsening back and left leg pain, consistent with an S1 nerve root involvement, now status post L5-S1 laminectomy microdiscectomy. Post op with persistent left leg pain, noted to be hypotensive earlier today 3/20, with SBP in 70's and requiring NS boluses with minimal response. Also noted to have minimal urine output after multiple fluid boluses, lethargic. Unable to discharge and TR[facility] asked to provide additional recommendations.   Active Problems:  HNP (herniated nucleus pulposus), lumbar  - per primary team  Lethargy  - appears to be related to use of several muscle relaxants, Flexerill (last dose at 2 am), Zanaflex (scheduled TID)  - pt also on Neurontin and Elavil and both can be rather sedating medications  - pt now more alert and wants to go home, BP more stable this AM - if pt discharged today, I discussed holding Coreg and Lisinopril until she sees PCP, I also recommended checking BP regularly (she can also see me in the [facility] [phone]), I will put note under follow up section - I also discussed with pt restarting her muscle relaxants slowly when she goes home, pt verbalized understanding  Hypotension  - better this AM - if pt going home today, hold Lisinopril and Coreg  Oliguria  - foley placed  - UOP ~ 1500 over the past 24 hours  Diabetes type II  - reasonable inpatient control  Diabetic neuropathy  - discussed restarting neurontin very slowly over the next week  Procedures/Studies: Dg Chest Port 1 View   10/31/2013   There is bilateral pulmonary hypo inflation which may be related to the patient's lethargy. No  definite pneumonia nor CHF is demonstrated.   Electronically Signed   By: David  [place]   On: 10/31/2013 14:59   Antibiotics:  None  Code Status: Full Family Communication: Pt at bedside Disposition Plan: Home when medically stable, per primary team   HPI/Subjective: No events overnight.   Objective: Filed Vitals:   10/31/13 1705 10/31/13 1758 10/31/13 2100 11/01/13 0643  BP: 96/61 105/66 115/77 115/67  Pulse: 108 81 110 103  Temp: 98.1 F (36.7 C) 98.3 F (36.8 C) 98.3 F (36.8 C) 98.1 F (36.7 C)  TempSrc:  Oral Oral Oral  Resp: 18 18 19 18   SpO2: 92% 95% 98% 95%    Intake/Output Summary (Last 24 hours) at 11/01/13 0914 Last data filed at 11/01/13 0700  Gross per 24 hour  Intake   3010 ml  Output   1429 ml  Net   1581 ml    Exam:   General:  Pt is alert, follows commands appropriately, not in acute distress  Cardiovascular: Regular rhythm, tachycardic, S1/S2, no murmurs, no rubs, no gallops  Respiratory: Clear to auscultation bilaterally, no wheezing, no crackles, no rhonchi  Abdomen: Soft, non tender, non distended, bowel sounds present, no guarding  Extremities: No edema, pulses DP and PT palpable bilaterally  Neuro: Grossly nonfocal  Data Reviewed: Basic Metabolic Panel:  Recent Labs Lab 10/31/13 1446  NA 139  K 4.4  CL 107  CO2 21  GLUCOSE 176*  BUN 35*  CREATININE 1.09  CALCIUM 8.4   CBC:  Recent Labs Lab 10/31/13 1446  WBC 7.2  HGB 9.8*  HCT 30.6*  MCV 88.2  PLT 168   Cardiac Enzymes:  Recent Labs Lab 10/31/13 1446  TROPONINI <0.30   CBG:  Recent Labs Lab 10/30/13 2154 10/31/13 0810 10/31/13 1239 10/31/13 2118 11/01/13 0632  GLUCAP 210* 190* 207* 124* 203*     Scheduled Meds: . atorvastatin  80 mg Oral Daily  . docusate sodium  100 mg Oral BID  . insulin aspart  0-20 Units Subcutaneous TID WC  . insulin aspart  0-5 Units Subcutaneous QHS  . insulin aspart  8 Units Subcutaneous TID WC  . insulin detemir  15  Units Subcutaneous Daily  . lactated ringers  500 mL Intravenous Once  . Liraglutide  1.8 mg Subcutaneous Daily  . pantoprazole  40 mg Oral BID  . sodium chloride  250 mL Intravenous Once  . sodium chloride  3 mL Intravenous Q12H  . topiramate  125 mg Oral QHS   Continuous Infusions: . sodium chloride 75 mL/hr at 10/31/13 2200  . lactated ringers 20 mL/hr at 10/29/13 4782     Faye Ramsay, MD  [facility] Pager [phone]  If 7PM-7AM, please contact night-coverage [email or web address] Password [facility] 11/01/2013, 9:14 AM   LOS: 3 days

## 2013-11-01 NOTE — Progress Notes (Signed)
Patient discharged from 2W38 via wheelchair. Discharge paperwork reviewed with patient and patient states "no questions." Prescription given to patient.  IV and telemetry D/C'd.

## 2013-11-18 ENCOUNTER — Ambulatory Visit: Payer: Self-pay | Admitting: Pain Medicine

## 2013-12-18 ENCOUNTER — Ambulatory Visit: Payer: Self-pay | Admitting: Pain Medicine

## 2013-12-23 ENCOUNTER — Emergency Department: Payer: Self-pay | Admitting: Emergency Medicine

## 2013-12-23 ENCOUNTER — Ambulatory Visit: Payer: Self-pay | Admitting: Neurosurgery

## 2013-12-30 ENCOUNTER — Ambulatory Visit: Payer: Self-pay | Admitting: Neurology

## 2014-01-08 ENCOUNTER — Other Ambulatory Visit: Payer: Self-pay | Admitting: Neurosurgery

## 2014-01-12 NOTE — Pre-Procedure Instructions (Signed)
Marissa [name]  01/12/2014   Your procedure is scheduled on:  01/16/14  Report to [facility] Admitting at 530 AM.  Call this number if you have problems the morning of surgery: [phone]   Remember:   Do not eat food or drink liquids after midnight.   Take these medicines the morning of surgery with A SIP OF WATER: neurontin,oxycodone,topamax   Do not wear jewelry, make-up or nail polish.  Do not wear lotions, powders, or perfumes. You may wear deodorant.  Do not shave 48 hours prior to surgery. Men may shave face and neck.  Do not bring valuables to the hospital.  [facility] is not responsible                  for any belongings or valuables.               Contacts, dentures or bridgework may not be worn into surgery.  Leave suitcase in the car. After surgery it may be brought to your room.  For patients admitted to the hospital, discharge time is determined by your                treatment team.               Patients discharged the day of surgery will not be allowed to drive  home.  Name and phone number of your driver: family  Special Instructions: Shower using CHG 2 nights before surgery and the night before surgery.  If you shower the day of surgery use CHG.  Use special wash - you have one bottle of CHG for all showers.  You should use approximately 1/3 of the bottle for each shower.   Please read over the following fact sheets that you were given: Pain Booklet, Coughing and Deep Breathing, Blood Transfusion Information, MRSA Information and Surgical Site Infection Prevention

## 2014-01-13 ENCOUNTER — Encounter (HOSPITAL_COMMUNITY)
Admission: RE | Admit: 2014-01-13 | Discharge: 2014-01-13 | Disposition: A | Discharge status: Home / Self Care | Payer: Medicare HMO | Source: Ambulatory Visit | Attending: Neurosurgery | Admitting: Neurosurgery

## 2014-01-13 ENCOUNTER — Encounter (HOSPITAL_COMMUNITY): Payer: Self-pay

## 2014-01-13 DIAGNOSIS — R479 Unspecified speech disturbances: Secondary | ICD-10-CM | POA: Insufficient documentation

## 2014-01-13 DIAGNOSIS — E119 Type 2 diabetes mellitus without complications: Secondary | ICD-10-CM | POA: Insufficient documentation

## 2014-01-13 DIAGNOSIS — Z794 Long term (current) use of insulin: Secondary | ICD-10-CM

## 2014-01-13 DIAGNOSIS — F329 Major depressive disorder, single episode, unspecified: Secondary | ICD-10-CM | POA: Insufficient documentation

## 2014-01-13 DIAGNOSIS — Z01812 Encounter for preprocedural laboratory examination: Secondary | ICD-10-CM | POA: Insufficient documentation

## 2014-01-13 DIAGNOSIS — I10 Essential (primary) hypertension: Secondary | ICD-10-CM | POA: Insufficient documentation

## 2014-01-13 DIAGNOSIS — F32A Depression, unspecified: Secondary | ICD-10-CM | POA: Insufficient documentation

## 2014-01-13 LAB — BASIC METABOLIC PANEL
BUN: 11 mg/dL (ref 6–23)
CHLORIDE: 101 meq/L (ref 96–112)
CO2: 26 mEq/L (ref 19–32)
Calcium: 9.4 mg/dL (ref 8.4–10.5)
Creatinine, Ser: 0.65 mg/dL (ref 0.50–1.10)
GFR calc non Af Amer: >90 mL/min (ref >90)
Glucose, Bld: 207 mg/dL — ABNORMAL HIGH (ref 70–99)
Potassium: 4.1 mEq/L (ref 3.7–5.3)
Sodium: 140 mEq/L (ref 137–147)

## 2014-01-13 LAB — SURGICAL PCR SCREEN
MRSA, PCR: NEGATIVE
STAPHYLOCOCCUS AUREUS: NEGATIVE

## 2014-01-13 LAB — CBC
HCT: 38.6 % (ref 36.0–46.0)
HEMOGLOBIN: 12.7 g/dL (ref 12.0–15.0)
MCH: 28.7 pg (ref 26.0–34.0)
MCHC: 32.9 g/dL (ref 30.0–36.0)
MCV: 87.3 fL (ref 78.0–100.0)
Platelets: 227 10*3/uL (ref 150–400)
RBC: 4.42 MIL/uL (ref 3.87–5.11)
RDW: 13.8 % (ref 11.5–15.5)
WBC: 6.2 10*3/uL (ref 4.0–10.5)

## 2014-01-13 LAB — ABO/RH: ABO/RH(D): B POS

## 2014-01-14 NOTE — Progress Notes (Signed)
Anesthesia Chart Review: Patient is a 38 year old female posted for one level PLIF on 01/16/14 by Dr. [name].  History includes obesity (BMI 39), CAD with 50-60% mid LAD lesion by 2005 cath, possible MI "due to medication" ~ 2005/2006 (no evidence of ischemia or infarct on nuclear stress test '11; non-ischemic stress test 08/2013), HTN, HLD, non-smoker, GERD, migraine headaches, decreased vision related to DM, neuropathy, cervical fusion, lumbar laminectomy '11, 05/07/13 and 10/29/13. PCP is Dr. [name] with [facility]. Cardiologist is Dr. Ubaldo Glassing with [facility]).  Most recent records are scanned under the Media tab, Correspondence, Encounter 10/29/13.    EKG on 05/06/13 showed NSR.  48 hour Holter monitor from 09/11/13 showed: SR, ST, rare PVCs. No sustained ventricular arrhythmia.   Nuclear stress test on 09/09/13 [facility]) showed: Normal Regadenoson ECG without evidence of ischemia or dysrhythmia. Mild global LV dysfunction, EF 42%, normal myocardial perfusion images at rest and peak stress without evidence of ischemia.   Echo on 05/14/11 [facility]; scanned under Media tab, 04/2013) showed normal LV systolic function, EF 34%, mild MR, trace TR   She had a stress test in 2005 that showed a fixed anterior defect with subendocardial scar could not be excluded. She subsequently was referred for a cardiac cath on 07/18/04 at [facility] that showed normal LM, LAD was widely patent and normal throughout most of its course except for a focal cylindrical narrowing in the mid LAD estimated to be 50-60%. First septal branch arises proximal to the stenosis. Diagonal branches are medium and small, respectively. Entire Cx system appears normal. RCA is a dominant vessel with minor luminal irregularities in the distal segment proximal to the bifurcation of the PL and PDA branches. EF 50-55%. No regional dysfunction seen. (Records scanned under Media tab, 04/2013.)   1V CXR on 10/31/13 showed  hypoinflation which may be related to patient's lethargy.  No definite PNA or CHF. 2V CXR from 05/06/13 showed no active cardiopulmonary disease.   Preoperative labs noted. She will get a fasting CBG on arrival. Lab was unable to add on a serum pregnancy test, so staff can assess and add on the day of surgery if needed.   She had a recent non-ischemic stress test and tolerated back surgery a few months ago.  If no acute changes then I would anticipate that she could proceed as planned.  George Hugh [facility] Short Stay Center/Anesthesiology Phone [phone] 01/14/2014 10:05 AM

## 2014-01-15 ENCOUNTER — Ambulatory Visit: Payer: Self-pay | Admitting: Pain Medicine

## 2014-01-16 ENCOUNTER — Encounter (HOSPITAL_COMMUNITY): Payer: Medicare HMO | Admitting: Vascular Surgery

## 2014-01-16 ENCOUNTER — Inpatient Hospital Stay (HOSPITAL_COMMUNITY)
Admission: RE | Admit: 2014-01-16 | Discharge: 2014-01-21 | DRG: 460 | Disposition: A | Discharge status: Home / Self Care | Payer: Medicare HMO | Source: Ambulatory Visit | Attending: Neurosurgery | Admitting: Neurosurgery

## 2014-01-16 ENCOUNTER — Encounter (HOSPITAL_COMMUNITY)
Admission: RE | Disposition: A | Discharge status: Home / Self Care | Payer: Commercial Managed Care - HMO | Source: Ambulatory Visit | Attending: Neurosurgery

## 2014-01-16 ENCOUNTER — Encounter (HOSPITAL_COMMUNITY): Payer: Self-pay | Admitting: *Deleted

## 2014-01-16 ENCOUNTER — Inpatient Hospital Stay (HOSPITAL_COMMUNITY): Payer: Medicare HMO | Admitting: Anesthesiology

## 2014-01-16 ENCOUNTER — Inpatient Hospital Stay (HOSPITAL_COMMUNITY): Payer: Medicare HMO

## 2014-01-16 DIAGNOSIS — M5137 Other intervertebral disc degeneration, lumbosacral region: Secondary | ICD-10-CM | POA: Diagnosis present

## 2014-01-16 DIAGNOSIS — I252 Old myocardial infarction: Secondary | ICD-10-CM | POA: Diagnosis not present

## 2014-01-16 DIAGNOSIS — Z6841 Body Mass Index (BMI) 40.0 and over, adult: Secondary | ICD-10-CM

## 2014-01-16 DIAGNOSIS — R0609 Other forms of dyspnea: Secondary | ICD-10-CM | POA: Diagnosis present

## 2014-01-16 DIAGNOSIS — I10 Essential (primary) hypertension: Secondary | ICD-10-CM | POA: Diagnosis present

## 2014-01-16 DIAGNOSIS — M5126 Other intervertebral disc displacement, lumbar region: Principal | ICD-10-CM | POA: Diagnosis present

## 2014-01-16 DIAGNOSIS — K219 Gastro-esophageal reflux disease without esophagitis: Secondary | ICD-10-CM | POA: Diagnosis present

## 2014-01-16 DIAGNOSIS — I2510 Atherosclerotic heart disease of native coronary artery without angina pectoris: Secondary | ICD-10-CM | POA: Diagnosis present

## 2014-01-16 DIAGNOSIS — R0989 Other specified symptoms and signs involving the circulatory and respiratory systems: Secondary | ICD-10-CM | POA: Diagnosis present

## 2014-01-16 DIAGNOSIS — E119 Type 2 diabetes mellitus without complications: Secondary | ICD-10-CM | POA: Diagnosis present

## 2014-01-16 DIAGNOSIS — M51379 Other intervertebral disc degeneration, lumbosacral region without mention of lumbar back pain or lower extremity pain: Secondary | ICD-10-CM | POA: Diagnosis present

## 2014-01-16 LAB — GLUCOSE, CAPILLARY
GLUCOSE-CAPILLARY: 189 mg/dL — AB (ref 70–99)
Glucose-Capillary: 121 mg/dL — ABNORMAL HIGH (ref 70–99)
Glucose-Capillary: 382 mg/dL — ABNORMAL HIGH (ref 70–99)
Glucose-Capillary: 424 mg/dL — ABNORMAL HIGH (ref 70–99)
Glucose-Capillary: 417 mg/dL — ABNORMAL HIGH (ref 70–99)

## 2014-01-16 LAB — GLUCOSE, RANDOM: GLUCOSE: 456 mg/dL — AB (ref 70–99)

## 2014-01-16 SURGERY — POSTERIOR LUMBAR FUSION 1 LEVEL
Anesthesia: General | Site: Spine Lumbar

## 2014-01-16 MED ORDER — CYCLOBENZAPRINE HCL 10 MG PO TABS
ORAL_TABLET | ORAL | Status: AC
  Filled 2014-01-16: qty 1

## 2014-01-16 MED ORDER — ONDANSETRON HCL 4 MG PO TABS: 4.0000 mg | ORAL_TABLET | Freq: Three times a day (TID) | ORAL | Status: DC | PRN

## 2014-01-16 MED ORDER — ATORVASTATIN CALCIUM 80 MG PO TABS
80.0000 mg | ORAL_TABLET | Freq: Every day | ORAL | Status: DC
  Administered 2014-01-16 – 2014-01-21 (×6): 80 mg via ORAL
  Filled 2014-01-16 (×6): qty 1

## 2014-01-16 MED ORDER — THROMBIN 20000 UNITS EX SOLR
CUTANEOUS | Status: DC | PRN
  Administered 2014-01-16: 09:00:00 via TOPICAL

## 2014-01-16 MED ORDER — CYCLOBENZAPRINE HCL 10 MG PO TABS
10.0000 mg | ORAL_TABLET | Freq: Three times a day (TID) | ORAL | Status: DC | PRN
  Administered 2014-01-16 – 2014-01-20 (×6): 10 mg via ORAL
  Filled 2014-01-16 (×8): qty 1

## 2014-01-16 MED ORDER — STERILE WATER FOR INJECTION IJ SOLN
INTRAMUSCULAR | Status: AC
  Filled 2014-01-16: qty 10

## 2014-01-16 MED ORDER — INSULIN ASPART 100 UNIT/ML ~~LOC~~ SOLN
0.0000 [IU] | Freq: Every day | SUBCUTANEOUS | Status: DC
  Administered 2014-01-16: 5 [IU] via SUBCUTANEOUS
  Administered 2014-01-19 – 2014-01-20 (×2): 4 [IU] via SUBCUTANEOUS

## 2014-01-16 MED ORDER — GLYCOPYRROLATE 0.2 MG/ML IJ SOLN
INTRAMUSCULAR | Status: AC
  Filled 2014-01-16: qty 4

## 2014-01-16 MED ORDER — ONDANSETRON HCL 4 MG/2ML IJ SOLN
INTRAMUSCULAR | Status: DC | PRN
  Administered 2014-01-16: 4 mg via INTRAVENOUS

## 2014-01-16 MED ORDER — INSULIN ASPART 100 UNIT/ML ~~LOC~~ SOLN
0.0000 [IU] | Freq: Three times a day (TID) | SUBCUTANEOUS | Status: DC
  Administered 2014-01-16: 15 [IU] via SUBCUTANEOUS

## 2014-01-16 MED ORDER — OXYCODONE-ACETAMINOPHEN 5-325 MG PO TABS
1.0000 | ORAL_TABLET | ORAL | Status: DC | PRN
  Filled 2014-01-16: qty 2

## 2014-01-16 MED ORDER — PROPOFOL 10 MG/ML IV BOLUS
INTRAVENOUS | Status: DC | PRN
  Administered 2014-01-16: 200 mg via INTRAVENOUS

## 2014-01-16 MED ORDER — METOCLOPRAMIDE HCL 5 MG/ML IJ SOLN
INTRAMUSCULAR | Status: DC | PRN
  Administered 2014-01-16: 10 mg via INTRAVENOUS

## 2014-01-16 MED ORDER — OXYCODONE HCL 5 MG PO TABS: 5.0000 mg | ORAL_TABLET | Freq: Once | ORAL | Status: DC | PRN

## 2014-01-16 MED ORDER — INSULIN ASPART 100 UNIT/ML FLEXPEN: 10.0000 [IU] | PEN_INJECTOR | Freq: Three times a day (TID) | SUBCUTANEOUS | Status: DC

## 2014-01-16 MED ORDER — ALUM & MAG HYDROXIDE-SIMETH 200-200-20 MG/5ML PO SUSP: 30.0000 mL | Freq: Four times a day (QID) | ORAL | Status: DC | PRN

## 2014-01-16 MED ORDER — DIPHENHYDRAMINE HCL 12.5 MG/5ML PO ELIX
12.5000 mg | ORAL_SOLUTION | Freq: Four times a day (QID) | ORAL | Status: DC | PRN
  Administered 2014-01-16 (×2): 12.5 mg via ORAL
  Administered 2014-01-17: 17:00:00 via ORAL
  Administered 2014-01-17: 12.5 mg via ORAL
  Filled 2014-01-16: qty 5
  Filled 2014-01-16 (×4): qty 10

## 2014-01-16 MED ORDER — HYDROMORPHONE HCL PF 1 MG/ML IJ SOLN
0.2500 mg | INTRAMUSCULAR | Status: DC | PRN
  Administered 2014-01-16 (×2): 0.5 mg via INTRAVENOUS

## 2014-01-16 MED ORDER — HYDROMORPHONE HCL PF 1 MG/ML IJ SOLN
INTRAMUSCULAR | Status: AC
  Filled 2014-01-16: qty 1

## 2014-01-16 MED ORDER — MENTHOL 3 MG MT LOZG: 1.0000 | LOZENGE | OROMUCOSAL | Status: DC | PRN

## 2014-01-16 MED ORDER — CEFAZOLIN SODIUM 1-5 GM-% IV SOLN
1.0000 g | Freq: Three times a day (TID) | INTRAVENOUS | Status: AC
  Administered 2014-01-16 (×2): 1 g via INTRAVENOUS
  Filled 2014-01-16 (×2): qty 50

## 2014-01-16 MED ORDER — GLYCOPYRROLATE 0.2 MG/ML IJ SOLN
INTRAMUSCULAR | Status: DC | PRN
  Administered 2014-01-16: 0.6 mg via INTRAVENOUS

## 2014-01-16 MED ORDER — ARTIFICIAL TEARS OP OINT
TOPICAL_OINTMENT | OPHTHALMIC | Status: DC | PRN
  Administered 2014-01-16: 1 via OPHTHALMIC

## 2014-01-16 MED ORDER — FENTANYL CITRATE 0.05 MG/ML IJ SOLN
50.0000 ug | INTRAMUSCULAR | Status: AC
  Administered 2014-01-16 (×2): 50 ug via INTRAVENOUS

## 2014-01-16 MED ORDER — PHENYLEPHRINE 40 MCG/ML (10ML) SYRINGE FOR IV PUSH (FOR BLOOD PRESSURE SUPPORT)
PREFILLED_SYRINGE | INTRAVENOUS | Status: AC
  Filled 2014-01-16: qty 10

## 2014-01-16 MED ORDER — OXYCODONE HCL 5 MG/5ML PO SOLN: 5.0000 mg | Freq: Once | ORAL | Status: DC | PRN

## 2014-01-16 MED ORDER — OXYCODONE HCL 5 MG PO TABS
30.0000 mg | ORAL_TABLET | Freq: Four times a day (QID) | ORAL | Status: DC | PRN
  Administered 2014-01-16 – 2014-01-18 (×8): 30 mg via ORAL
  Filled 2014-01-16 (×10): qty 6

## 2014-01-16 MED ORDER — SODIUM CHLORIDE 0.9 % IJ SOLN: 3.0000 mL | INTRAMUSCULAR | Status: DC | PRN

## 2014-01-16 MED ORDER — LIRAGLUTIDE 18 MG/3ML ~~LOC~~ SOPN
1.8000 mg | PEN_INJECTOR | Freq: Every day | SUBCUTANEOUS | Status: DC
Start: 2014-01-16 — End: 2014-01-18
  Administered 2014-01-18: 1.8 mg via SUBCUTANEOUS

## 2014-01-16 MED ORDER — CARVEDILOL 6.25 MG PO TABS
6.2500 mg | ORAL_TABLET | Freq: Two times a day (BID) | ORAL | Status: DC
  Administered 2014-01-16 – 2014-01-21 (×8): 6.25 mg via ORAL
  Filled 2014-01-16 (×12): qty 1

## 2014-01-16 MED ORDER — SUCCINYLCHOLINE CHLORIDE 20 MG/ML IJ SOLN
INTRAMUSCULAR | Status: AC
  Filled 2014-01-16: qty 1

## 2014-01-16 MED ORDER — LIDOCAINE HCL (CARDIAC) 20 MG/ML IV SOLN
INTRAVENOUS | Status: AC
  Filled 2014-01-16: qty 10

## 2014-01-16 MED ORDER — SODIUM CHLORIDE 0.9 % IJ SOLN
3.0000 mL | Freq: Two times a day (BID) | INTRAMUSCULAR | Status: DC
  Administered 2014-01-16 – 2014-01-21 (×10): 3 mL via INTRAVENOUS

## 2014-01-16 MED ORDER — FENTANYL CITRATE 0.05 MG/ML IJ SOLN
INTRAMUSCULAR | Status: AC
  Filled 2014-01-16: qty 2

## 2014-01-16 MED ORDER — ACETAMINOPHEN 650 MG RE SUPP
650.0000 mg | RECTAL | Status: DC | PRN
  Filled 2014-01-16: qty 1

## 2014-01-16 MED ORDER — NEOSTIGMINE METHYLSULFATE 10 MG/10ML IV SOLN
INTRAVENOUS | Status: AC
  Filled 2014-01-16: qty 1

## 2014-01-16 MED ORDER — ARTIFICIAL TEARS OP OINT
TOPICAL_OINTMENT | OPHTHALMIC | Status: AC
  Filled 2014-01-16: qty 3.5

## 2014-01-16 MED ORDER — ROCURONIUM BROMIDE 50 MG/5ML IV SOLN
INTRAVENOUS | Status: AC
  Filled 2014-01-16: qty 1

## 2014-01-16 MED ORDER — VECURONIUM BROMIDE 10 MG IV SOLR
INTRAVENOUS | Status: AC
  Filled 2014-01-16: qty 10

## 2014-01-16 MED ORDER — PHENOL 1.4 % MT LIQD: 1.0000 | OROMUCOSAL | Status: DC | PRN

## 2014-01-16 MED ORDER — SODIUM CHLORIDE 0.9 % IV SOLN: 250.0000 mL | INTRAVENOUS | Status: DC

## 2014-01-16 MED ORDER — TIZANIDINE HCL 4 MG PO TABS: 4.0000 mg | ORAL_TABLET | Freq: Three times a day (TID) | ORAL | Status: DC

## 2014-01-16 MED ORDER — FENTANYL CITRATE 0.05 MG/ML IJ SOLN
INTRAMUSCULAR | Status: AC
  Filled 2014-01-16: qty 5

## 2014-01-16 MED ORDER — HYDROMORPHONE HCL PF 1 MG/ML IJ SOLN
0.5000 mg | INTRAMUSCULAR | Status: AC | PRN
  Administered 2014-01-16 (×4): 0.5 mg via INTRAVENOUS

## 2014-01-16 MED ORDER — LIDOCAINE HCL (CARDIAC) 20 MG/ML IV SOLN
INTRAVENOUS | Status: DC | PRN
  Administered 2014-01-16: 60 mg via INTRAVENOUS

## 2014-01-16 MED ORDER — NEOSTIGMINE METHYLSULFATE 10 MG/10ML IV SOLN
INTRAVENOUS | Status: DC | PRN
  Administered 2014-01-16: 5 mg via INTRAVENOUS

## 2014-01-16 MED ORDER — SODIUM CHLORIDE 0.9 % IV SOLN
10.0000 mg | INTRAVENOUS | Status: DC | PRN
  Administered 2014-01-16: 15 ug/min via INTRAVENOUS

## 2014-01-16 MED ORDER — ROCURONIUM BROMIDE 100 MG/10ML IV SOLN
INTRAVENOUS | Status: DC | PRN
  Administered 2014-01-16: 50 mg via INTRAVENOUS

## 2014-01-16 MED ORDER — GLYCOPYRROLATE 0.2 MG/ML IJ SOLN
INTRAMUSCULAR | Status: AC
  Filled 2014-01-16: qty 1

## 2014-01-16 MED ORDER — HYDROMORPHONE HCL PF 1 MG/ML IJ SOLN
INTRAMUSCULAR | Status: AC
  Filled 2014-01-16: qty 2

## 2014-01-16 MED ORDER — PHENYLEPHRINE HCL 10 MG/ML IJ SOLN
INTRAMUSCULAR | Status: DC | PRN
  Administered 2014-01-16: 80 ug via INTRAVENOUS

## 2014-01-16 MED ORDER — GABAPENTIN 300 MG PO CAPS
600.0000 mg | ORAL_CAPSULE | Freq: Three times a day (TID) | ORAL | Status: DC
  Administered 2014-01-16 – 2014-01-21 (×15): 600 mg via ORAL
  Filled 2014-01-16 (×17): qty 2

## 2014-01-16 MED ORDER — SODIUM CHLORIDE 0.9 % IR SOLN
Status: DC | PRN
  Administered 2014-01-16 (×2)

## 2014-01-16 MED ORDER — INSULIN ASPART 100 UNIT/ML ~~LOC~~ SOLN
15.0000 [IU] | Freq: Once | SUBCUTANEOUS | Status: AC
  Administered 2014-01-16: 15 [IU] via SUBCUTANEOUS

## 2014-01-16 MED ORDER — CEFAZOLIN SODIUM-DEXTROSE 2-3 GM-% IV SOLR
INTRAVENOUS | Status: DC | PRN
  Administered 2014-01-16: 2 g via INTRAVENOUS

## 2014-01-16 MED ORDER — VECURONIUM BROMIDE 10 MG IV SOLR
INTRAVENOUS | Status: DC | PRN
  Administered 2014-01-16: 2 mg via INTRAVENOUS
  Administered 2014-01-16: 1 mg via INTRAVENOUS
  Administered 2014-01-16 (×2): 2 mg via INTRAVENOUS

## 2014-01-16 MED ORDER — DOCUSATE SODIUM 100 MG PO CAPS
100.0000 mg | ORAL_CAPSULE | Freq: Two times a day (BID) | ORAL | Status: DC
  Administered 2014-01-16 – 2014-01-21 (×9): 100 mg via ORAL
  Filled 2014-01-16 (×9): qty 1

## 2014-01-16 MED ORDER — AMITRIPTYLINE HCL 100 MG PO TABS
100.0000 mg | ORAL_TABLET | Freq: Every day | ORAL | Status: DC
  Administered 2014-01-16 – 2014-01-20 (×5): 100 mg via ORAL
  Filled 2014-01-16 (×6): qty 1

## 2014-01-16 MED ORDER — NITROGLYCERIN 0.4 MG SL SUBL: 0.4000 mg | SUBLINGUAL_TABLET | SUBLINGUAL | Status: DC | PRN

## 2014-01-16 MED ORDER — ACETAMINOPHEN 325 MG PO TABS
650.0000 mg | ORAL_TABLET | ORAL | Status: DC | PRN
  Administered 2014-01-18 – 2014-01-20 (×4): 650 mg via ORAL
  Filled 2014-01-16 (×4): qty 2

## 2014-01-16 MED ORDER — DEXAMETHASONE SODIUM PHOSPHATE 10 MG/ML IJ SOLN
INTRAMUSCULAR | Status: AC
  Filled 2014-01-16: qty 1

## 2014-01-16 MED ORDER — METOCLOPRAMIDE HCL 5 MG/ML IJ SOLN: 10.0000 mg | Freq: Once | INTRAMUSCULAR | Status: DC | PRN

## 2014-01-16 MED ORDER — PROPOFOL 10 MG/ML IV BOLUS
INTRAVENOUS | Status: AC
  Filled 2014-01-16: qty 20

## 2014-01-16 MED ORDER — BUPIVACAINE HCL (PF) 0.25 % IJ SOLN
INTRAMUSCULAR | Status: DC | PRN
  Administered 2014-01-16: 10 mL

## 2014-01-16 MED ORDER — EPHEDRINE SULFATE 50 MG/ML IJ SOLN
INTRAMUSCULAR | Status: AC
  Filled 2014-01-16: qty 1

## 2014-01-16 MED ORDER — TOPIRAMATE 100 MG PO TABS
100.0000 mg | ORAL_TABLET | Freq: Every day | ORAL | Status: DC
  Administered 2014-01-16 – 2014-01-21 (×6): 100 mg via ORAL
  Filled 2014-01-16 (×6): qty 1

## 2014-01-16 MED ORDER — HYDROMORPHONE HCL PF 1 MG/ML IJ SOLN
0.5000 mg | INTRAMUSCULAR | Status: DC | PRN
  Administered 2014-01-16 – 2014-01-18 (×7): 1 mg via INTRAVENOUS
  Filled 2014-01-16 (×7): qty 1

## 2014-01-16 MED ORDER — ONDANSETRON HCL 4 MG/2ML IJ SOLN
4.0000 mg | INTRAMUSCULAR | Status: DC | PRN
  Filled 2014-01-16: qty 2

## 2014-01-16 MED ORDER — ONDANSETRON HCL 4 MG/2ML IJ SOLN
INTRAMUSCULAR | Status: AC
  Filled 2014-01-16: qty 2

## 2014-01-16 MED ORDER — TOPIRAMATE 25 MG PO TABS
25.0000 mg | ORAL_TABLET | Freq: Every day | ORAL | Status: DC
  Administered 2014-01-16 – 2014-01-21 (×6): 25 mg via ORAL
  Filled 2014-01-16 (×6): qty 1

## 2014-01-16 MED ORDER — INSULIN ASPART 100 UNIT/ML ~~LOC~~ SOLN
0.0000 [IU] | Freq: Three times a day (TID) | SUBCUTANEOUS | Status: DC
  Administered 2014-01-17: 7 [IU] via SUBCUTANEOUS
  Administered 2014-01-17: 15 [IU] via SUBCUTANEOUS
  Administered 2014-01-17: 7 [IU] via SUBCUTANEOUS
  Administered 2014-01-18: 4 [IU] via SUBCUTANEOUS
  Administered 2014-01-18: 15 [IU] via SUBCUTANEOUS
  Administered 2014-01-18: 4 [IU] via SUBCUTANEOUS
  Administered 2014-01-19: 7 [IU] via SUBCUTANEOUS
  Administered 2014-01-19 – 2014-01-20 (×2): 11 [IU] via SUBCUTANEOUS
  Administered 2014-01-20 (×2): 7 [IU] via SUBCUTANEOUS
  Administered 2014-01-21: 3 [IU] via SUBCUTANEOUS

## 2014-01-16 MED ORDER — 0.9 % SODIUM CHLORIDE (POUR BTL) OPTIME
TOPICAL | Status: DC | PRN
  Administered 2014-01-16: 1000 mL

## 2014-01-16 MED ORDER — DEXAMETHASONE SODIUM PHOSPHATE 10 MG/ML IJ SOLN
INTRAMUSCULAR | Status: DC | PRN
  Administered 2014-01-16: 10 mg via INTRAVENOUS

## 2014-01-16 MED ORDER — MIDAZOLAM HCL 2 MG/2ML IJ SOLN
INTRAMUSCULAR | Status: AC
  Filled 2014-01-16: qty 2

## 2014-01-16 MED ORDER — LIDOCAINE-EPINEPHRINE 1 %-1:100000 IJ SOLN
INTRAMUSCULAR | Status: DC | PRN
  Administered 2014-01-16: 9 mL

## 2014-01-16 MED ORDER — OXYCODONE HCL 5 MG PO TABS
ORAL_TABLET | ORAL | Status: AC
  Administered 2014-01-16: 5 mg
  Filled 2014-01-16: qty 1

## 2014-01-16 MED ORDER — SODIUM CHLORIDE 0.9 % IJ SOLN
INTRAMUSCULAR | Status: AC
  Filled 2014-01-16: qty 10

## 2014-01-16 MED ORDER — FENTANYL CITRATE 0.05 MG/ML IJ SOLN
INTRAMUSCULAR | Status: DC | PRN
  Administered 2014-01-16 (×6): 50 ug via INTRAVENOUS
  Administered 2014-01-16: 100 ug via INTRAVENOUS
  Administered 2014-01-16: 50 ug via INTRAVENOUS
  Administered 2014-01-16 (×2): 100 ug via INTRAVENOUS

## 2014-01-16 MED ORDER — MIDAZOLAM HCL 5 MG/5ML IJ SOLN
INTRAMUSCULAR | Status: DC | PRN
  Administered 2014-01-16: 2 mg via INTRAVENOUS

## 2014-01-16 MED ORDER — DEXTROSE 5 % IV SOLN
INTRAVENOUS | Status: DC | PRN
  Administered 2014-01-16: 08:00:00 via INTRAVENOUS

## 2014-01-16 MED ORDER — SUMATRIPTAN SUCCINATE 50 MG PO TABS: 50.0000 mg | ORAL_TABLET | ORAL | Status: DC | PRN

## 2014-01-16 MED ORDER — LACTATED RINGERS IV SOLN
INTRAVENOUS | Status: DC | PRN
  Administered 2014-01-16 (×3): via INTRAVENOUS

## 2014-01-16 SURGICAL SUPPLY — 74 items
BAG DECANTER FOR FLEXI CONT (MISCELLANEOUS) ×4 IMPLANT
BENZOIN TINCTURE PRP APPL 2/3 (GAUZE/BANDAGES/DRESSINGS) ×2 IMPLANT
BLADE SURG 11 STRL SS (BLADE) ×2 IMPLANT
BRUSH SCRUB EZ PLAIN DRY (MISCELLANEOUS) ×2 IMPLANT
BUR MATCHSTICK NEURO 3.0 LAGG (BURR) ×2 IMPLANT
BUR PRECISION FLUTE 6.0 (BURR) ×2 IMPLANT
CANISTER SUCT 3000ML (MISCELLANEOUS) ×2 IMPLANT
CAP LOCKING THREADED (Cap) ×8 IMPLANT
CONT SPEC 4OZ CLIKSEAL STRL BL (MISCELLANEOUS) ×4 IMPLANT
COVER BACK TABLE 24X17X13 BIG (DRAPES) IMPLANT
COVER TABLE BACK 60X90 (DRAPES) ×2 IMPLANT
DECANTER SPIKE VIAL GLASS SM (MISCELLANEOUS) ×2 IMPLANT
DERMABOND ADHESIVE PROPEN (GAUZE/BANDAGES/DRESSINGS) ×1
DERMABOND ADVANCED (GAUZE/BANDAGES/DRESSINGS)
DERMABOND ADVANCED .7 DNX12 (GAUZE/BANDAGES/DRESSINGS) IMPLANT
DERMABOND ADVANCED .7 DNX6 (GAUZE/BANDAGES/DRESSINGS) ×1 IMPLANT
DRAPE C-ARM 42X72 X-RAY (DRAPES) ×4 IMPLANT
DRAPE LAPAROTOMY 100X72X124 (DRAPES) ×2 IMPLANT
DRAPE POUCH INSTRU U-SHP 10X18 (DRAPES) ×2 IMPLANT
DRAPE PROXIMA HALF (DRAPES) IMPLANT
DRAPE SURG 17X23 STRL (DRAPES) ×2 IMPLANT
DRSG OPSITE 4X5.5 SM (GAUZE/BANDAGES/DRESSINGS) ×2 IMPLANT
DRSG OPSITE POSTOP 4X8 (GAUZE/BANDAGES/DRESSINGS) ×2 IMPLANT
DURAPREP 26ML APPLICATOR (WOUND CARE) ×2 IMPLANT
ELECT BLADE 4.0 EZ CLEAN MEGAD (MISCELLANEOUS) ×2
ELECT REM PT RETURN 9FT ADLT (ELECTROSURGICAL) ×2
ELECTRODE BLDE 4.0 EZ CLN MEGD (MISCELLANEOUS) ×1 IMPLANT
ELECTRODE REM PT RTRN 9FT ADLT (ELECTROSURGICAL) ×1 IMPLANT
EVACUATOR 3/16  PVC DRAIN (DRAIN) ×1
EVACUATOR 3/16 PVC DRAIN (DRAIN) ×1 IMPLANT
GAUZE SPONGE 4X4 16PLY XRAY LF (GAUZE/BANDAGES/DRESSINGS) ×2 IMPLANT
GLOVE BIO SURGEON STRL SZ7 (GLOVE) ×2 IMPLANT
GLOVE BIO SURGEON STRL SZ8 (GLOVE) ×4 IMPLANT
GLOVE BIOGEL PI IND STRL 8 (GLOVE) ×2 IMPLANT
GLOVE BIOGEL PI INDICATOR 8 (GLOVE) ×2
GLOVE ECLIPSE 6.5 STRL STRAW (GLOVE) ×2 IMPLANT
GLOVE ECLIPSE 7.5 STRL STRAW (GLOVE) ×6 IMPLANT
GLOVE EXAM NITRILE LRG STRL (GLOVE) IMPLANT
GLOVE EXAM NITRILE MD LF STRL (GLOVE) IMPLANT
GLOVE EXAM NITRILE XL STR (GLOVE) IMPLANT
GLOVE EXAM NITRILE XS STR PU (GLOVE) IMPLANT
GLOVE INDICATOR 8.5 STRL (GLOVE) ×4 IMPLANT
GLOVE SURG SS PI 7.0 STRL IVOR (GLOVE) ×2 IMPLANT
GOWN STRL REUS W/ TWL LRG LVL3 (GOWN DISPOSABLE) ×1 IMPLANT
GOWN STRL REUS W/ TWL XL LVL3 (GOWN DISPOSABLE) ×2 IMPLANT
GOWN STRL REUS W/TWL 2XL LVL3 (GOWN DISPOSABLE) ×2 IMPLANT
GOWN STRL REUS W/TWL LRG LVL3 (GOWN DISPOSABLE) ×1
GOWN STRL REUS W/TWL XL LVL3 (GOWN DISPOSABLE) ×2
KIT BASIN OR (CUSTOM PROCEDURE TRAY) ×2 IMPLANT
KIT ROOM TURNOVER OR (KITS) ×2 IMPLANT
MILL MEDIUM DISP (BLADE) ×2 IMPLANT
NEEDLE HYPO 25X1 1.5 SAFETY (NEEDLE) ×2 IMPLANT
NS IRRIG 1000ML POUR BTL (IV SOLUTION) ×2 IMPLANT
PACK LAMINECTOMY NEURO (CUSTOM PROCEDURE TRAY) ×2 IMPLANT
PAD ARMBOARD 7.5X6 YLW CONV (MISCELLANEOUS) ×10 IMPLANT
PUTTY BONE DBX 5CC MIX (Putty) ×2 IMPLANT
ROD 40MM SPINAL (Rod) ×4 IMPLANT
SCREW CREO 6.5X35MM (Screw) ×4 IMPLANT
SCREW CREO 6.5X40 (Screw) ×4 IMPLANT
SCREW PA THRD CREO TULIP 5.5X4 (Head) ×8 IMPLANT
SPACER RISE 8X22 8-14MM-10 (Neuro Prosthesis/Implant) ×4 IMPLANT
SPONGE GAUZE 4X4 12PLY (GAUZE/BANDAGES/DRESSINGS) ×2 IMPLANT
SPONGE LAP 4X18 X RAY DECT (DISPOSABLE) IMPLANT
SPONGE SURGIFOAM ABS GEL 100 (HEMOSTASIS) ×2 IMPLANT
STRIP CLOSURE SKIN 1/2X4 (GAUZE/BANDAGES/DRESSINGS) ×4 IMPLANT
SUT VIC AB 0 CT1 18XCR BRD8 (SUTURE) ×1 IMPLANT
SUT VIC AB 0 CT1 8-18 (SUTURE) ×1
SUT VIC AB 2-0 CT1 18 (SUTURE) ×4 IMPLANT
SUT VICRYL 4-0 PS2 18IN ABS (SUTURE) ×2 IMPLANT
SYR 20ML ECCENTRIC (SYRINGE) ×2 IMPLANT
TOWEL OR 17X24 6PK STRL BLUE (TOWEL DISPOSABLE) ×2 IMPLANT
TOWEL OR 17X26 10 PK STRL BLUE (TOWEL DISPOSABLE) ×2 IMPLANT
TRAY FOLEY CATH 14FRSI W/METER (CATHETERS) ×2 IMPLANT
WATER STERILE IRR 1000ML POUR (IV SOLUTION) ×2 IMPLANT

## 2014-01-16 NOTE — Anesthesia Procedure Notes (Signed)
Procedure Name: Intubation Date/Time: 01/16/2014 7:50 AM Performed by: Jacquiline Doe A Pre-anesthesia Checklist: Patient identified, Timeout performed, Emergency Drugs available, Suction available and Patient being monitored Patient Re-evaluated:Patient Re-evaluated prior to inductionOxygen Delivery Method: Circle system utilized Preoxygenation: Pre-oxygenation with 100% oxygen Intubation Type: IV induction and Cricoid Pressure applied Ventilation: Mask ventilation without difficulty Laryngoscope Size: Mac and 4 Grade View: Grade I Tube type: Oral Tube size: 7.5 mm Number of attempts: 1 Airway Equipment and Method: Stylet Placement Confirmation: ETT inserted through vocal cords under direct vision,  breath sounds checked- equal and bilateral and positive ETCO2 Secured at: 22 cm Tube secured with: Tape Dental Injury: Teeth and Oropharynx as per pre-operative assessment

## 2014-01-16 NOTE — Progress Notes (Addendum)
Patient has NO ovaries...pregnancy test deferred.  DA

## 2014-01-16 NOTE — H&P (Signed)
Marissa [name] is an 38 y.o. female.   Chief Complaint: Back and left leg pain HPI: Patient is a 38 year old and has had progressive worsening back and left leg pain leading into a laminectomy and discectomy proximal to 3 months ago for left L5-S1 radiculopathy with minimal to no relief of surgery. We continue to treat this for 3 months conservatively without significant benefit workup and followup imaging revealed persistent stenosis and possible recurrent versus synovial cyst versus recurrent disc in the foramen underneath the facet complex. Due to patient's progressive back and leg pain he failed conservative treatment imaging findings I recommended a reexploration re\re decompression L5-S1 with interbody fusion. I extensively went over the risks and benefits of the operation the patient as well as perioperative course expectations of outcome and alternatives of surgery and she understood and agreed to proceed forward.  Past Medical History  Diagnosis Date  . Hypertension   . Coronary artery disease   . Neuropathy   . Headache(784.0)   . Arthritis   . Vision loss     due to diabetes  . Myocardial infarction 2006    "due to medication"; no evidence of ischemia or infarction by nuclear stress test '11  . Dysrhythmia     tachycardia  . Diabetes mellitus without complication     fasting cbg 50-140s  . GERD (gastroesophageal reflux disease)     otc meds  . Migraines     once/month maybe - can last up to two weeks  . Hyperlipemia     Past Surgical History  Procedure Laterality Date  . Back surgery  2011    Lumbar   . Cervical fusion  2006  . Salpingoophorectomy Bilateral     1 1997. 2nd 2001  . Cardiac catheterization  07/18/2004    50-60% mid LAD, minor luminal irregularities RCA, normal LM and CX, EF 50-55% [facility])  . Lumbar laminectomy/decompression microdiscectomy Right 05/07/2013    Procedure: Right Lumbar one-two laminectomy;  Surgeon: Elaina Hoops, MD;   Location: [place] NEURO ORS;  Service: Neurosurgery;  Laterality: Right;  . Carpel tunnel Bilateral   . Lumbar laminectomy/decompression microdiscectomy Left 10/29/2013    Procedure: Left Lumbar five-Sacral one Laminectomy;  Surgeon: Elaina Hoops, MD;  Location: [place] NEURO ORS;  Service: Neurosurgery;  Laterality: Left;    History reviewed. No pertinent family history. Social History:  reports that she has never smoked. She does not have any smokeless tobacco history on file. She reports that she does not drink alcohol or use illicit drugs.  Allergies:  Allergies  Allergen Reactions  . Ciprofloxacin Swelling  . Azithromycin Itching and Rash  . Food Itching and Rash    "Greek yogurt"    Medications Prior to Admission  Medication Sig Dispense Refill  . amitriptyline (ELAVIL) 100 MG tablet Take 100 mg by mouth at bedtime.      Marland Kitchen atorvastatin (LIPITOR) 80 MG tablet Take 80 mg by mouth daily.      . carvedilol (COREG) 6.25 MG tablet Take 6.25 mg by mouth 2 (two) times daily with a meal.      . gabapentin (NEURONTIN) 300 MG capsule Take 600 mg by mouth 3 (three) times daily.       . insulin aspart (NOVOLOG FLEXPEN) 100 UNIT/ML SOPN FlexPen Inject 10-20 Units into the skin 3 (three) times daily with meals. Sliding scale      . Liraglutide (VICTOZA) 18 MG/3ML SOPN Inject 1.8 mg into the skin daily.      Marland Kitchen  oxycodone (ROXICODONE) 30 MG immediate release tablet Take 1 tablet (30 mg total) by mouth every 6 (six) hours as needed for pain.  15 tablet  0  . SUMAtriptan (IMITREX) 50 MG tablet Take 50 mg by mouth every 2 (two) hours as needed for migraine or headache. May repeat in 2 hours if headache persists or recurs.      Marland Kitchen tiZANidine (ZANAFLEX) 4 MG tablet Take 4 mg by mouth 3 (three) times daily.      Marland Kitchen topiramate (TOPAMAX) 100 MG tablet Take 100 mg by mouth daily.      Marland Kitchen topiramate (TOPAMAX) 25 MG tablet Take 25 mg by mouth daily.      . nitroGLYCERIN (NITROSTAT) 0.4 MG SL tablet Place 0.4 mg under the  tongue every 5 (five) minutes as needed for chest pain.        Results for orders placed during the hospital encounter of 01/16/14 (from the past 48 hour(s))  GLUCOSE, CAPILLARY     Status: Abnormal   Collection Time    01/16/14  5:49 AM      Result Value Ref Range   Glucose-Capillary 121 (*) 70 - 99 mg/dL   Comment 1 Documented in Chart     Comment 2 Notify RN     No results found.  Review of Systems  Constitutional: Negative.   HENT: Negative.   Eyes: Negative.   Respiratory: Negative.   Cardiovascular: Negative.   Gastrointestinal: Negative.   Genitourinary: Negative.   Musculoskeletal: Positive for back pain, joint pain and myalgias.  Skin: Negative.   Neurological: Positive for tingling and sensory change.  Psychiatric/Behavioral: Negative.     Blood pressure 128/73, pulse 99, temperature 98.5 F (36.9 C), temperature source Oral, resp. rate 20, SpO2 100.00%. Physical Exam  Constitutional: She is oriented to person, place, and time. She appears well-developed and well-nourished.  Eyes: Pupils are equal, round, and reactive to light.  Respiratory: Effort normal.  GI: Soft. Bowel sounds are normal.  Neurological: She is alert and oriented to person, place, and time. She has normal strength. GCS eye subscore is 4. GCS verbal subscore is 5. GCS motor subscore is 6.  Strength in the right lower extremity is 5 out of 5 in her iliopsoas, quads, hip she's, gastrocs, into tibialis, and EHL. Left lower extremity has 5 out of 5 strength in her iliopsoas quads and hamstrings she has 44 to plus weakness and I think is somewhat effort-dependent in dorsiflexion of her left foot as well as EHL. And her plantar flexion 5 out of 5     Assessment/Plan 30-and presents for an L5-S1 decompression and fusion  Elaina Hoops 01/16/2014, 7:22 AM

## 2014-01-16 NOTE — Progress Notes (Signed)
Scheduled procedure is on Medicare IP only list.  Will need order for INPATIENT post-op. Thanks you  

## 2014-01-16 NOTE — Progress Notes (Signed)
Patient is an admit form PACU. Patient is alert and oriented x4. Will continue to monitor.

## 2014-01-16 NOTE — Anesthesia Preprocedure Evaluation (Addendum)
Anesthesia Evaluation  Patient identified by MRN, date of birth, ID band Patient awake    Reviewed: Allergy & Precautions, H&P , NPO status , Patient's Chart, lab work & pertinent test results, reviewed documented beta blocker date and time   Airway Mallampati: II TM Distance: >3 FB Neck ROM: full    Dental   Pulmonary neg pulmonary ROS,  breath sounds clear to auscultation        Cardiovascular hypertension, + CAD and + Past MI negative cardio ROS  + dysrhythmias Rhythm:regular     Neuro/Psych  Headaches,  Neuromuscular disease negative psych ROS   GI/Hepatic Neg liver ROS, GERD-  Medicated and Controlled,  Endo/Other  diabetes, Insulin DependentMorbid obesity  Renal/GU negative Renal ROS  negative genitourinary   Musculoskeletal   Abdominal   Peds  Hematology negative hematology ROS (+)   Anesthesia Other Findings See surgeon's H&P   Reproductive/Obstetrics negative OB ROS                          Anesthesia Physical Anesthesia Plan  ASA: III  Anesthesia Plan: General   Post-op Pain Management:    Induction: Intravenous  Airway Management Planned: Oral ETT  Additional Equipment:   Intra-op Plan:   Post-operative Plan: Extubation in OR  Informed Consent: I have reviewed the patients History and Physical, chart, labs and discussed the procedure including the risks, benefits and alternatives for the proposed anesthesia with the patient or authorized representative who has indicated his/her understanding and acceptance.   Dental Advisory Given  Plan Discussed with: CRNA and Surgeon  Anesthesia Plan Comments: (Risks of imbedded metal and piercings discussed and patient accepts risks. CF)       Anesthesia Quick Evaluation

## 2014-01-16 NOTE — Anesthesia Postprocedure Evaluation (Signed)
Anesthesia Post Note  Patient: Marissa [name]  Procedure(s) Performed: Procedure(s) (LRB): LUMBAR FIVE SACRAL ONE POSTERIOR LUMBAR INTERBODY FUSION (N/A)  Anesthesia type: General  Patient location: PACU  Post pain: Pain level controlled  Post assessment: Patient's Cardiovascular Status Stable  Last Vitals:  Filed Vitals:   01/16/14 1300  BP: 112/69  Pulse: 71  Temp:   Resp: 9    Post vital signs: Reviewed and stable  Level of consciousness: alert  Complications: No apparent anesthesia complications

## 2014-01-16 NOTE — Op Note (Signed)
Preoperative diagnosis: Lumbar spinal stenosis degenerative disease recurrent herniated disc pulposis left L5 and S1 radiculopathies  Postoperative diagnosis: Same  Procedure: Redo decompressive lumbar laminectomy L5-S1 complete medial facetectomies radical foraminotomies in excess and requiring more work than would be needed with a standard interbody fusion  #2 posterior lumbar interbody fusion using the globus arise peek cages packed with local autograft mixed with DBX next  #3 pedicle screw fixation L5-S1 using the Creo modular pedicle screw system from globus  #4 placement of a medium large Hemovac drain  Surgeon: Dominica Severin Kenzington Mielke  Assistant: Ashok Pall  Anesthesia: Gen.  EBL: Minimal  History of present illness: Patient is a 38 year old female underwent previous laminectomy discectomy approximately 3 months ago and had persistent back and leg pain workup revealed possibly scar tissue possibly recurrent disc herniation patient failed all forms of conservative treatment available to [place] 9 had progressive worsening leg pain with progressive worsening numbness tingling and weakness in her left foot. Due to her progression of clinical syndrome and imaging findings and failure conservative treatment I recommended redo decompression and fusion at L5-S1 I extensively went over the risks and benefits of the operation the patient as well as perioperative course expectations of outcome and alternatives of surgery she understood and agreed to proceed forward.  Operative procedure: Patient brought into the or was induced under general anesthesia positioned prone on the Wilson frame her back was prepped and draped in routine sterile fashion. Preoperative x-ray localize the appropriate level so after infiltration of 10 cc lidocaine with epi-year-old incision was opened up and extended cephalad caudally the scar tissues dissected free and subperiosteal dissections care lamina of L5 and S1 confirmed by  interoperative x-ray. Then the spinous process and lamina of L. L5 was removed and complete central decompression was begun working first on the right side normal anatomy was identified and then marking through the scar tissue and extensor scar was dissected off of the thecal sac and complete medial facetectomy was completed on the patient's left side radical foraminotomies were performed and the L5-S1 nerve roots there was a dense amount of scar as well as calcified disc herniation at L5-S1 left as well as a recurrent soft disc sedimentation rate up underneath the S1 nerve root this is all teased off of the S1 nerve root the disc spaces cleanout required a Kerrison rongeur and an 11 blade. After complete radical foraminotomies and decompression attention second the interbody work epidural veins were coagulated distractor was inserted the patient's right side using a size 9 and subsequent size 10 distractor disc spaces and further cleaned out the patient's left side removing all the recurrent disc cleaning out the central disc and scraping of the endplates and after adequate endplate preparation been achieved and all the disc and remove underneath the S1 nerve root as well as distally out the L5 foramen, a 8 mm expandable arise cage after being packed with local autograft mixed with DBX mix was inserted and expanded approximately 4 turns which helped open up the disc space and decompress the L5 foramen. The distractor was removed the spaces cleanout the right and a similar fashion in place a prepped in a similar fashion local are graft was packed centrally and right-sided cage was inserted. Fluoroscopy was used the step along the way to confirm position as well as direct inspection. Attention was taken at this to pedicle screw placement using a high-speed drill pilot holes were drilled under fluoroscopy cannulated probe and tapped probed and 6 5  x 40 screws inserted at L5 bilaterally and 6 5 x 35 at S1 bilaterally  then the wound scope to irrigate fixing space was maintained aggressive postop fluoroscopy confirmed good position of all the implants and screws then aggressive decortication was care MTPs or lateral gutters at L5-S1 the remainder the locally harvested our graft mixed DBX was packed posterior laterally along the TPS lateral facet complexes. The head were then reassembled 40 mm rods were inserted and cut top tightening nuts were tightened down. All the foraminal reinspected confirm patency Gelfoam was overlaid top of the dura a large Hemovac drain was placed and the wounds closed in layers with after Vicryl and the skin was closed running 4 subcuticular Dermabond benzo and Steri-Strips sterile dressings were applied patient recovered in stable condition. At case on it counts sponge counts were correct.

## 2014-01-16 NOTE — Transfer of Care (Signed)
Immediate Anesthesia Transfer of Care Note  Patient: Marissa [name]  Procedure(s) Performed: Procedure(s): LUMBAR FIVE SACRAL ONE POSTERIOR LUMBAR INTERBODY FUSION (N/A)  Patient Location: PACU  Anesthesia Type:General  Level of Consciousness: oriented, sedated, patient cooperative and responds to stimulation  Airway & Oxygen Therapy: Patient Spontanous Breathing and Patient connected to nasal cannula oxygen  Post-op Assessment: Report given to PACU RN, Post -op Vital signs reviewed and stable, Patient moving all extremities and Patient moving all extremities X 4  Post vital signs: Reviewed and stable  Complications: No apparent anesthesia complications

## 2014-01-16 NOTE — OR Nursing (Signed)
Patient has body piercing in left eyebrow that was unable to be removed. D. Dawson Albers RN

## 2014-01-17 LAB — GLUCOSE, CAPILLARY
GLUCOSE-CAPILLARY: 214 mg/dL — AB (ref 70–99)
GLUCOSE-CAPILLARY: 190 mg/dL — AB (ref 70–99)
Glucose-Capillary: 210 mg/dL — ABNORMAL HIGH (ref 70–99)
Glucose-Capillary: 220 mg/dL — ABNORMAL HIGH (ref 70–99)
Glucose-Capillary: 322 mg/dL — ABNORMAL HIGH (ref 70–99)
Glucose-Capillary: 393 mg/dL — ABNORMAL HIGH (ref 70–99)

## 2014-01-17 NOTE — Evaluation (Signed)
Physical Therapy Evaluation Patient Details Name: Marissa [name] MRN: [id number] DOB: 26-Jul-1976 Today's Date: 01/17/2014   History of Present Illness  s/p L5-S1 PLIF   Clinical Impression  Patient is s/p surgery listed above resulting in the deficits listed below (see PT Problem List). Patient will benefit from skilled PT to increase their independence and safety with mobility (while adhering to their precautions) to allow discharge home with family and significant other. Pt ambulating at very decreased gt speed and demo difficulty advancing Lt LE due to weakness and pain. Discussed possible need for SNF if pt does not progress well with rehab, pt understanding of recommendations.     Follow Up Recommendations Home health PT;Supervision/Assistance - 24 hour    Equipment Recommendations  None recommended by PT    Recommendations for Other Services OT consult     Precautions / Restrictions Precautions Precautions: Fall;Back Precaution Booklet Issued: Yes (comment) Precaution Comments: educated on 3/3 back precautions; pt with multiple past back surgeries  Required Braces or Orthoses: Spinal Brace Spinal Brace: Lumbar corset;Applied in sitting position Restrictions Weight Bearing Restrictions: No      Mobility  Bed Mobility Overal bed mobility: Needs Assistance Bed Mobility: Rolling;Sidelying to Sit Rolling: Supervision Sidelying to sit: Min assist       General bed mobility comments: cues for log rolling technique and (A) to bring trunk to upright position; incr time due to LE weakness and pain   Transfers Overall transfer level: Needs assistance Equipment used: Rolling walker (2 wheeled) Transfers: Sit to/from Stand Sit to Stand: Min assist         General transfer comment: cues for hand placement and safety with RW; pt requries incr time due to pain; (A) to maintain balance   Ambulation/Gait Ambulation/Gait assistance: Min assist Ambulation Distance (Feet): 40  Feet Assistive device: Rolling walker (2 wheeled) Gait Pattern/deviations: Decreased step length - left;Shuffle;Decreased stride length;Step-to pattern;Trunk flexed;Wide base of support Gait velocity: very decreased Gait velocity interpretation: <1.8 ft/sec, indicative of risk for recurrent falls General Gait Details: pt with difficulty clearing Lt LE due to weakness and pain; cues for upright posture and sequencing throughout; ambulating at decreased speed and taking multiple standing rest breaks; (A) to facilitate Lt step with increased fatigue; pt shaky due to LE weakness and fearful of falling   Stairs            Wheelchair Mobility    Modified Rankin (Stroke Patients Only)       Balance Overall balance assessment: Needs assistance Sitting-balance support: Feet supported;No upper extremity supported Sitting balance-Leahy Scale: Fair     Standing balance support: During functional activity;Bilateral upper extremity supported Standing balance-Leahy Scale: Poor Standing balance comment: requires RW and (A) to maintain balance                              Pertinent Vitals/Pain 9/10 Lt LE; premedicated     Home Living Family/patient expects to be discharged to:: Private residence Living Arrangements: Spouse/significant other;Other relatives;Parent (sisters ) Available Help at Discharge: Family;Available 24 hours/day Type of Home: House Home Access: Stairs to enter Entrance Stairs-Rails: None Entrance Stairs-Number of Steps: 3 Home Layout: One level Home Equipment: Walker - 2 wheels;Bedside commode      Prior Function Level of Independence: Independent with assistive device(s)         Comments: pt ambulated with RW since surgery in march      Hand Dominance  Dominant Hand: Right    Extremity/Trunk Assessment   Upper Extremity Assessment: Defer to OT evaluation           Lower Extremity Assessment: LLE deficits/detail   LLE Deficits /  Details: hip 3-/5; quad 3+/5  Cervical / Trunk Assessment: Kyphotic  Communication   Communication: No difficulties  Cognition Arousal/Alertness: Lethargic;Suspect due to medications Behavior During Therapy: [facility] for tasks assessed/performed Overall Cognitive Status: Within Functional Limits for tasks assessed                      General Comments      Exercises        Assessment/Plan    PT Assessment Patient needs continued PT services  PT Diagnosis Difficulty walking;Generalized weakness;Acute pain   PT Problem List Decreased strength;Decreased activity tolerance;Decreased balance;Decreased mobility;Decreased knowledge of use of DME;Decreased knowledge of precautions;Pain  PT Treatment Interventions DME instruction;Gait training;Stair training;Functional mobility training;Therapeutic activities;Therapeutic exercise;Balance training;Neuromuscular re-education;Patient/family education   PT Goals (Current goals can be found in the Care Plan section) Acute Rehab PT Goals Patient Stated Goal: to not have pain PT Goal Formulation: With patient Time For Goal Achievement: 01/24/14 Potential to Achieve Goals: Good    Frequency Min 5X/week   Barriers to discharge        Co-evaluation               End of Session Equipment Utilized During Treatment: Gait belt;Back brace Activity Tolerance: Patient limited by fatigue;Patient limited by pain Patient left: in bed;with call bell/phone within reach;with family/visitor present;Other (comment) (sitting EOB) Nurse Communication: Mobility status;Precautions         Time: 7741-2878 PT Time Calculation (min): 29 min   Charges:   PT Evaluation $Initial PT Evaluation Tier I: 1 Procedure PT Treatments $Gait Training: 23-37 mins   PT G CodesKennis Carina [place], [place]  [phone] 01/17/2014, 2:26 PM

## 2014-01-17 NOTE — Progress Notes (Signed)
Removed foley catheter at 0600. Pt ambulated in room with one assist, walker and brace. Pt tolerated well. Mild anxiety. Will continue to monitor.

## 2014-01-17 NOTE — Progress Notes (Signed)
Patient did not void since the removal of foley catheter, bladder scanned, about 320ml noted.Patient in and out cath and 237ml drained. Will continue to monitor.

## 2014-01-17 NOTE — Evaluation (Signed)
Occupational Therapy Evaluation Patient Details Name: Marissa [name] MRN: [id number] DOB: 20-Sep-1975 Today's Date: 01/17/2014    History of Present Illness s/p L5-S1 PLIF    Clinical Impression   Pt was performing ADL independently prior to this admission and using a walker for mobility since her second back surgery in March.  Pt with lethargy having been medicated for pain.  Moves extremely slowly.  Did not ambulate with pt, stand-pivot only for use of BSC.  Pt was not able to release the walker to stand for LB bathing, dressing or pericare due to pain and weakness. Pt has good support at home and will hopefully progress to being able to return with HHOT. Will follow acutely.      Follow Up Recommendations  Home health OT;Supervision/Assistance - 24 hour (depending on progress)    Equipment Recommendations  None recommended by OT    Recommendations for Other Services       Precautions / Restrictions Precautions Precautions: Fall;Back Precaution Comments: pt is well aware of back precautions from 3 prior back surgeries. Required Braces or Orthoses: Spinal Brace Spinal Brace: Lumbar corset;Applied in sitting position Restrictions Weight Bearing Restrictions: No      Mobility Bed Mobility Overal bed mobility: Needs Assistance Bed Mobility: Rolling;Sidelying to Sit;Sit to Sidelying Rolling: Supervision Sidelying to sit: Min assist     Sit to sidelying: Min assist General bed mobility comments: increased time and verbal cues, assist for trunk from sidelying and for LEs sit to sidelying.  Transfers Overall transfer level: Needs assistance Equipment used: Rolling walker (2 wheeled) Transfers: Stand Pivot Transfers   Stand pivot transfers: Min assist       General transfer comment: increased time, verbal cues for technique    Balance                                            ADL Overall ADL's : Needs assistance/impaired Eating/Feeding:  Independent;Sitting   Grooming: Wash/dry hands;Set up;Sitting   Upper Body Bathing: Set up;Sitting   Lower Body Bathing: Sit to/from stand;Moderate assistance   Upper Body Dressing : Set up;Sitting   Lower Body Dressing: Sit to/from stand;Moderate assistance   Toilet Transfer: Minimal assistance;Stand-pivot Armed forces technical officer Details (indicate cue type and reason): due to lethargy, did not walk pt Toileting- Clothing Manipulation and Hygiene: Min guard       Functional mobility during ADLs: Minimal assistance;Rolling walker General ADL Comments: Pt has family to rely upon to assist with ADL.  Pt stood to shower last 2 surgeries.     Vision                     Perception     Praxis      Pertinent Vitals/Pain Back pain, did not rate, premedicated, repositioned     Hand Dominance Right   Extremity/Trunk Assessment Upper Extremity Assessment Upper Extremity Assessment: Overall WFL for tasks assessed   Lower Extremity Assessment Lower Extremity Assessment: Defer to PT evaluation       Communication Communication Communication: No difficulties   Cognition Arousal/Alertness: Lethargic;Suspect due to medications Behavior During Therapy: [facility] for tasks assessed/performed Overall Cognitive Status: Within Functional Limits for tasks assessed                     General Comments       Exercises  Shoulder Instructions      Home Living Family/patient expects to be discharged to:: Private residence Living Arrangements: Spouse/significant other;Other relatives;Parent Available Help at Discharge: Family;Available 24 hours/day Type of Home: House Home Access: Stairs to enter [employer] of Steps: 3 Entrance Stairs-Rails: None Home Layout: One level     Bathroom Shower/Tub: Teacher, early years/pre: Standard     Home Equipment: Environmental consultant - 2 wheels;Bedside commode          Prior Functioning/Environment Level of  Independence: Independent with assistive device(s)        Comments: pt ambulated with RW since surgery in March, could perform self care independently without AE.     OT Diagnosis: Generalized weakness;Acute pain   OT Problem List: Decreased strength;Decreased activity tolerance;Impaired balance (sitting and/or standing);Decreased knowledge of use of DME or AE;Pain;Decreased knowledge of precautions   OT Treatment/Interventions: Self-care/ADL training;DME and/or AE instruction;Patient/family education;Therapeutic activities    OT Goals(Current goals can be found in the care plan section) Acute Rehab OT Goals Patient Stated Goal: to not have pain OT Goal Formulation: With patient Time For Goal Achievement: 01/24/14 Potential to Achieve Goals: Good  OT Frequency: Min 2X/week   Barriers to D/C:            Co-evaluation              End of Session    Activity Tolerance: Patient limited by lethargy;Patient limited by pain Patient left: in bed;with call bell/phone within reach;with family/visitor present   Time: 3154-0086 OT Time Calculation (min): 22 min Charges:  OT General Charges $OT Visit: 1 Procedure OT Evaluation $Initial OT Evaluation Tier I: 1 Procedure OT Treatments $Self Care/Home Management : 8-22 mins G-Codes:    Haze Boyden Jalie Eiland 2014-02-08, 4:12 PM [phone]

## 2014-01-17 NOTE — Progress Notes (Signed)
Subjective: Patient reports Reasonably comfortable still has numbness in left foot has been out of bed to recliner this morning Foley catheter is out  Objective: Vital signs in last 24 hours: Temp:  [97.6 F (36.4 C)-98.4 F (36.9 C)] 97.7 F (36.5 C) (06/06 0955) Pulse Rate:  [71-93] 90 (06/06 0955) Resp:  [9-19] 18 (06/06 0955) BP: (84-137)/(50-86) 101/58 mmHg (06/06 0955) SpO2:  [93 %-100 %] 100 % (06/06 0955) Weight:  [109.634 kg (241 lb 11.2 oz)] 109.634 kg (241 lb 11.2 oz) (06/05 1348)  Intake/Output from previous day: 06/05 0701 - 06/06 0700 In: 2850 [I.V.:2850] Out: 1900 [Urine:1300; Drains:300; Blood:300] Intake/Output this shift:    Dressing is intact moderate moderate drainage and Hemovac odor function 4/5 in tibialis anterior with some reinforcement spontaneous movement seems to be quite limited however patient is able to maintain her foot dorsiflexed when placed in that position  Lab Results: No results found for this basename: WBC, HGB, HCT, PLT,  in the last 72 hours BMET  Recent Labs  01/16/14 2210  GLUCOSE 456*    Studies/Results: Dg Lumbar Spine 2-3 Views  01/16/2014   CLINICAL DATA:  Imaging during lumbar fusion.  EXAM: DG C-ARM 61-120 MIN; LUMBAR SPINE - 2-3 VIEW  COMPARISON:  MRI on 12/05/2013.  FLUOROSCOPY TIME:  44 seconds.  FINDINGS: Intraoperative imaging with a C-arm demonstrates interbody disc prostheses at L5-S1 and placement of pedicular screws bilaterally at the L5 and S1 levels during lumbar fusion.  IMPRESSION: Imaging obtained during PLIF at L5-S1.   Electronically Signed   By: Aletta Edouard M.D.   On: 01/16/2014 11:28   Dg C-arm 1-60 Min  01/16/2014   CLINICAL DATA:  Imaging during lumbar fusion.  EXAM: DG C-ARM 61-120 MIN; LUMBAR SPINE - 2-3 VIEW  COMPARISON:  MRI on 12/05/2013.  FLUOROSCOPY TIME:  44 seconds.  FINDINGS: Intraoperative imaging with a C-arm demonstrates interbody disc prostheses at L5-S1 and placement of pedicular screws  bilaterally at the L5 and S1 levels during lumbar fusion.  IMPRESSION: Imaging obtained during PLIF at L5-S1.   Electronically Signed   By: Aletta Edouard M.D.   On: 01/16/2014 11:28    Assessment/Plan: Stable postop day 1  LOS: 1 day  Foley catheter is out, Hemovac drain remains in place. Will need further mobilization. Patient encouragement offered   Marissa [name] 01/17/2014, 10:38 AM

## 2014-01-17 NOTE — Progress Notes (Signed)
OT Cancellation Note  Patient Details Name: Marissa [name] MRN: [id number] DOB: Apr 20, 1976   Cancelled Treatment:    Reason Eval/Treat Not Completed: Fatigue/lethargy limiting ability to participate.  Pt recently received dilaudid for pain, sleeping soundly/snoring, did not disturb. Aunt in room. Will continue to follow.  [name] 01/17/2014, 1:48 PM [phone]

## 2014-01-18 ENCOUNTER — Inpatient Hospital Stay (HOSPITAL_COMMUNITY): Payer: Medicare HMO

## 2014-01-18 LAB — GLUCOSE, CAPILLARY
GLUCOSE-CAPILLARY: 154 mg/dL — AB (ref 70–99)
GLUCOSE-CAPILLARY: 206 mg/dL — AB (ref 70–99)
GLUCOSE-CAPILLARY: 98 mg/dL (ref 70–99)
Glucose-Capillary: 304 mg/dL — ABNORMAL HIGH (ref 70–99)
Glucose-Capillary: 197 mg/dL — ABNORMAL HIGH (ref 70–99)

## 2014-01-18 LAB — BLOOD GAS, ARTERIAL
ACID-BASE DEFICIT: 0.2 mmol/L (ref 0.0–2.0)
Bicarbonate: 24.6 mEq/L — ABNORMAL HIGH (ref 20.0–24.0)
Drawn by: 29017
O2 Content: 6 L/min
O2 SAT: 95.4 %
PCO2 ART: 45.7 mmHg — AB (ref 35.0–45.0)
Patient temperature: 98.6
TCO2: 26 mmol/L (ref 0–100)
pH, Arterial: 7.351 (ref 7.350–7.450)
pO2, Arterial: 79.2 mmHg — ABNORMAL LOW (ref 80.0–100.0)

## 2014-01-18 LAB — MAGNESIUM: MAGNESIUM: 2.2 mg/dL (ref 1.5–2.5)

## 2014-01-18 LAB — LACTIC ACID, PLASMA: LACTIC ACID, VENOUS: 1.4 mmol/L (ref 0.5–2.2)

## 2014-01-18 LAB — BASIC METABOLIC PANEL
BUN: 14 mg/dL (ref 6–23)
CO2: 25 meq/L (ref 19–32)
Calcium: 8.8 mg/dL (ref 8.4–10.5)
Chloride: 100 mEq/L (ref 96–112)
Creatinine, Ser: 0.73 mg/dL (ref 0.50–1.10)
GFR calc Af Amer: >90 mL/min (ref >90)
GFR calc non Af Amer: >90 mL/min (ref >90)
GLUCOSE: 242 mg/dL — AB (ref 70–99)
POTASSIUM: 5.3 meq/L (ref 3.7–5.3)
SODIUM: 136 meq/L — AB (ref 137–147)

## 2014-01-18 LAB — TROPONIN I: Troponin I: <0.3 ng/mL (ref <0.30)

## 2014-01-18 MED ORDER — NALOXONE HCL 0.4 MG/ML IJ SOLN
INTRAMUSCULAR | Status: AC
  Filled 2014-01-18: qty 2

## 2014-01-18 MED ORDER — NALOXONE HCL 0.4 MG/ML IJ SOLN
0.4000 mg | INTRAMUSCULAR | Status: DC | PRN
  Administered 2014-01-18: 0.4 mg via INTRAVENOUS
  Filled 2014-01-18: qty 1

## 2014-01-18 NOTE — Progress Notes (Signed)
Patient was asked by this writer to bring the home med victoza be sent to pharmacy for rebelling, patient said she dont want to send it down to the pharmacy as she may be going home tomorrow,pharmacy unit called to notify

## 2014-01-18 NOTE — Progress Notes (Signed)
NT reported BP of 85/34, BP rechecked was 91/52. Patient asymptomatic at this time. Will continue to monitor.

## 2014-01-18 NOTE — Progress Notes (Signed)
  Patient Name: Marissa [name]   MRN: [id number]   Date of Birth/ Sex: 1976-05-23 , female      Admission Date: 01/16/2014  Attending Provider: Elaina Hoops, MD  Primary Diagnosis: <principal problem not specified>   Indication: The patient was noted to be in respiratory distress and was having agonal breathing, but still had good pulse. Code blue was subsequently called. At the time of arrival on scene, she was found to have regular rhythm and rate. She also had good pulse.  She received high dose of oxycodone (30 mg) at about 9:21 PM. She was given one dose of Narcan 0.4 mg, and waked up, became responsive. She became oriented X 3. Stat EKG has no ischemic change. Will d/c all pain medications tonight. Will give prn narcan for agonal breathing.    Technical Description:  - CPR performance duration:  12 minute  - Was defibrillation or cardioversion used? No   - Was external pacer placed? No  - Was patient intubated pre/post CPR? No   Medications Administered: Y = Yes; Blank = No Amiodarone    Atropine    Calcium    Epinephrine    Lidocaine    Magnesium    Norepinephrine    Phenylephrine    Sodium bicarbonate    Vasopressin     Post CPR evaluation:  - Final Status - Was patient successfully resuscitated ? Yes - What is current rhythm? Sinus and regular - What is current hemodynamic status? yes  Miscellaneous Information:  - Labs sent, including: BMP, lactic acid, trop.   - Primary team notified?  Will notice  - Family Notified? Yes  - Additional notes/ transfer status: Transfer to [place], MD  01/18/2014, 10:52 PM

## 2014-01-18 NOTE — Progress Notes (Signed)
Physical Therapy Treatment Patient Details Name: Marissa [name] MRN: [id number] DOB: 1976-02-12 Today's Date: 01/18/2014    History of Present Illness s/p L5-S1 PLIF     PT Comments    Pt slowly progressing with mobility. Continues to be limited by pain and muscle "cramps". Will cont to follow per POC.   Follow Up Recommendations  Home health PT;Supervision/Assistance - 24 hour     Equipment Recommendations  None recommended by PT    Recommendations for Other Services OT consult     Precautions / Restrictions Precautions Precautions: Fall;Back Precaution Comments: pt unable to recall back precautions today  Required Braces or Orthoses: Spinal Brace Spinal Brace: Lumbar corset;Applied in sitting position Restrictions Weight Bearing Restrictions: No    Mobility  Bed Mobility               General bed mobility comments: pt up in bathroom and returned to chair   Transfers Overall transfer level: Needs assistance Equipment used: Rolling walker (2 wheeled) Transfers: Sit to/from Stand Sit to Stand: From elevated surface;Supervision;Modified independent (Device/Increase time)         General transfer comment: mod I for transfer from toilet with use of arm rests.   Ambulation/Gait Ambulation/Gait assistance: Min assist Ambulation Distance (Feet): 70 Feet Assistive device: Rolling walker (2 wheeled) Gait Pattern/deviations: Decreased step length - left;Step-to pattern;Decreased stride length;Wide base of support;Trunk flexed Gait velocity: very decreased Gait velocity interpretation: Below normal speed for age/gender General Gait Details: pt fatigues quickly and continues to have difficulty clearing Lt LE with increased fatigue; cues for upright posture and gt sequencing    Stairs Stairs: Yes Stairs assistance: Min assist Stair Management: No rails;Step to pattern;Forwards Number of Stairs: 3 General stair comments: pt requires handheld (A) for stair  management; cues for sequencing and safety   Wheelchair Mobility    Modified Rankin (Stroke Patients Only)       Balance Overall balance assessment: Needs assistance         Standing balance support: During functional activity;Bilateral upper extremity supported Standing balance-Leahy Scale: Poor Standing balance comment: requries RW for balance                     Cognition Arousal/Alertness: Awake/alert Behavior During Therapy: Anxious Overall Cognitive Status: Within Functional Limits for tasks assessed       Memory: Decreased recall of precautions;Decreased short-term memory              Exercises      General Comments General comments (skin integrity, edema, etc.): educated on back precautions throughout session       Pertinent Vitals/Pain 8/10; RN made aware    Home Living                      Prior Function            PT Goals (current goals can now be found in the care plan section) Acute Rehab PT Goals Patient Stated Goal: to not have pain PT Goal Formulation: With patient Time For Goal Achievement: 01/24/14 Potential to Achieve Goals: Good Progress towards PT goals: Progressing toward goals    Frequency  Min 5X/week    PT Plan Current plan remains appropriate    Co-evaluation             End of Session Equipment Utilized During Treatment: Gait belt;Back brace Activity Tolerance: Patient tolerated treatment well Patient left: in chair;with call bell/phone within reach;with family/visitor present  Time: 1130-1150 PT Time Calculation (min): 20 min  Charges:  $Gait Training: 8-22 mins                    G Codes:      [place], [place]  [phone] 01/18/2014, 12:42 PM

## 2014-01-18 NOTE — Progress Notes (Signed)
Chaplain responded to Code Blue and offered emotional support to pt's aunt. Aunt is now at pt's bedside, talking with pt. She expressed appreciation for chaplain support. Please page if needed.   Ethelene Browns [phone]

## 2014-01-18 NOTE — Progress Notes (Signed)
Patient ID: Marissa [name], female   DOB: Sep 13, 1975, 38 y.o.   MRN: [id number] Subjective: Patient reports continued back and leg pain. She states she is getting to the bathroom okay but if she walks for to long her legs feel weak. She wants to go home but needs IV pain medications.  Objective: Vital signs in last 24 hours: Temp:  [97.4 F (36.3 C)-99.9 F (37.7 C)] 99.3 F (37.4 C) (06/07 1009) Pulse Rate:  [87-117] 117 (06/07 1009) Resp:  [14-20] 18 (06/07 1009) BP: (87-121)/(57-66) 112/66 mmHg (06/07 1009) SpO2:  [93 %-100 %] 98 % (06/07 1009)  Intake/Output from previous day: 06/06 0701 - 06/07 0700 In: 240 [P.O.:240] Out: 375 [Urine:250; Drains:125] Intake/Output this shift:    Neurologic: Grossly normal  Lab Results: Lab Results  Component Value Date   WBC 6.2 01/13/2014   HGB 12.7 01/13/2014   HCT 38.6 01/13/2014   MCV 87.3 01/13/2014   PLT 227 01/13/2014   No results found for this basename: INR, PROTIME   BMET Lab Results  Component Value Date   NA 140 01/13/2014   K 4.1 01/13/2014   CL 101 01/13/2014   CO2 26 01/13/2014   GLUCOSE 456* 01/16/2014   BUN 11 01/13/2014   CREATININE 0.65 01/13/2014   CALCIUM 9.4 01/13/2014    Studies/Results: Dg Lumbar Spine 2-3 Views  01/16/2014   CLINICAL DATA:  Imaging during lumbar fusion.  EXAM: DG C-ARM 61-120 MIN; LUMBAR SPINE - 2-3 VIEW  COMPARISON:  MRI on 12/05/2013.  FLUOROSCOPY TIME:  44 seconds.  FINDINGS: Intraoperative imaging with a C-arm demonstrates interbody disc prostheses at L5-S1 and placement of pedicular screws bilaterally at the L5 and S1 levels during lumbar fusion.  IMPRESSION: Imaging obtained during PLIF at L5-S1.   Electronically Signed   By: Aletta Edouard M.D.   On: 01/16/2014 11:28   Dg C-arm 1-60 Min  01/16/2014   CLINICAL DATA:  Imaging during lumbar fusion.  EXAM: DG C-ARM 61-120 MIN; LUMBAR SPINE - 2-3 VIEW  COMPARISON:  MRI on 12/05/2013.  FLUOROSCOPY TIME:  44 seconds.  FINDINGS: Intraoperative imaging with a C-arm  demonstrates interbody disc prostheses at L5-S1 and placement of pedicular screws bilaterally at the L5 and S1 levels during lumbar fusion.  IMPRESSION: Imaging obtained during PLIF at L5-S1.   Electronically Signed   By: Aletta Edouard M.D.   On: 01/16/2014 11:28    Assessment/Plan: Continue pain management. Home when able to tolerate oral pain medications only   LOS: 2 days    Eustace Moore 01/18/2014, 10:28 AM

## 2014-01-19 LAB — GLUCOSE, CAPILLARY
GLUCOSE-CAPILLARY: 277 mg/dL — AB (ref 70–99)
GLUCOSE-CAPILLARY: 302 mg/dL — AB (ref 70–99)
Glucose-Capillary: 210 mg/dL — ABNORMAL HIGH (ref 70–99)
Glucose-Capillary: 203 mg/dL — ABNORMAL HIGH (ref 70–99)
Glucose-Capillary: 186 mg/dL — ABNORMAL HIGH (ref 70–99)
Glucose-Capillary: 226 mg/dL — ABNORMAL HIGH (ref 70–99)

## 2014-01-19 LAB — MRSA PCR SCREENING: MRSA BY PCR: NEGATIVE

## 2014-01-19 MED ORDER — BIOTENE DRY MOUTH MT LIQD
15.0000 mL | Freq: Two times a day (BID) | OROMUCOSAL | Status: DC
  Administered 2014-01-19 – 2014-01-20 (×3): 15 mL via OROMUCOSAL

## 2014-01-19 MED ORDER — KETOROLAC TROMETHAMINE 30 MG/ML IJ SOLN
30.0000 mg | Freq: Four times a day (QID) | INTRAMUSCULAR | Status: DC | PRN
  Administered 2014-01-19 – 2014-01-21 (×7): 30 mg via INTRAVENOUS
  Filled 2014-01-19 (×7): qty 1

## 2014-01-19 MED ORDER — SODIUM CHLORIDE 0.9 % IV SOLN
INTRAVENOUS | Status: DC
  Administered 2014-01-19 – 2014-01-21 (×4): via INTRAVENOUS

## 2014-01-19 MED FILL — Medication: Qty: 1 | Status: AC

## 2014-01-19 NOTE — Progress Notes (Signed)
Patient responding poorly to stimulation, RR 9, shallow breathing, BP low. Narcan 0.4mg  IV given, NS 500cc bolus given. Post Narcan, patient awoken, responds to commands, alert to surroundings. Patient's aunt and boyfriend at bedside. Updated on POC.

## 2014-01-19 NOTE — Progress Notes (Signed)
PT Cancellation Note  Patient Details Name: Marissa [name] MRN: [id number] DOB: [birth year]   Cancelled Treatment:     Patient with Low BP upon attempt this am. Will follow up as able.   Duncan Dull 01/19/2014, 8:42 AM

## 2014-01-19 NOTE — Progress Notes (Signed)
Patient transported in bed on 100%NRB with Zoll pads on from 4N by April, RR RN. Patient responds to voice, shallow breathing, situationally confused. Bed alarm on. Will continue to monitor.

## 2014-01-19 NOTE — Progress Notes (Signed)
Subjective: Patient reports respiratory depression/arrest noted last night. Patient has been on oxycodone 30 and IV dilaudid. Now all are stopped. Still with some BP lability  Objective: Vital signs in last 24 hours: Temp:  [98 F (36.7 C)-99.1 F (37.3 C)] 98 F (36.7 C) (06/08 0330) Pulse Rate:  [88-100] 100 (06/08 0909) Resp:  [9-26] 17 (06/08 0700) BP: (82-127)/(34-72) 112/70 mmHg (06/08 0909) SpO2:  [74 %-100 %] 98 % (06/08 0909) FiO2 (%):  [100 %] 100 % (06/08 0330) Weight:  [110 kg (242 lb 8.1 oz)] 110 kg (242 lb 8.1 oz) (06/07 2333)  Intake/Output from previous day: 06/07 0701 - 06/08 0700 In: 790 [P.O.:340; I.V.:450] Out: 395 [Urine:250; Drains:145] Intake/Output this shift:    alert and oriented moving le well. BP lability noted by nurses with mobility  Lab Results: No results found for this basename: WBC, HGB, HCT, PLT,  in the last 72 hours BMET  Recent Labs  01/16/14 2210 01/18/14 2245  NA  --  136*  K  --  5.3  CL  --  100  CO2  --  25  GLUCOSE 456* 242*  BUN  --  14  CREATININE  --  0.73  CALCIUM  --  8.8    Studies/Results: Dg Chest Port 1 View  01/18/2014   CLINICAL DATA:  Apnea.  EXAM: PORTABLE CHEST - 1 VIEW  COMPARISON:  10/31/2013  FINDINGS: Shallow inspiration. Normal heart size and pulmonary vascularity. Visualization of the right lung base is limited due to a patch overlying this area but there appears to be an airspace infiltrate, suggesting pneumonia or atelectasis. No blunting of costophrenic angles. No pneumothorax. Left lung appears grossly clear.  IMPRESSION: Probable focal infiltration in the right lung base.   Electronically Signed   By: Lucienne Capers M.D.   On: 01/18/2014 23:37    Assessment/Plan: Patient has refused to use bed side commode but explained difficulty with med overdose as it relates to what is occurring now. She is requesting something for pain but no narcs are written.    LOS: 3 days  Patient willing to use bed side  commode after some discussion... will add Toradol for pain control;. resume some narcotic pain med when BP and heart Lability improves (lessens)   Marissa [name] 01/19/2014, 10:31 AM

## 2014-01-19 NOTE — Clinical Documentation Improvement (Signed)
Per documentation patient had "respiratory distress" s/p narcotic medication with "agonal breathing, RR 9" requiring IV narcan, CPR, NRB mask at 4L.Marland KitchenMarland Kitchenpatient maintained pulse and was quickly returned to stable condition. Please document the severity of the diagnosis if appropriate. Thank you   Possible Clinical Conditions?  Acute Respiratory distress  Acute Respiratory Insufficiency  Other Condition  Cannot Clinically Determine   Signs & Symptoms: noted above  Treatment: IV Narcan, CPR, NRB mask  Thank You, Ree Kida ,RN Clinical Documentation Specialist:  [facility] [employer] [facility]

## 2014-01-19 NOTE — Progress Notes (Signed)
Patient BP remains low, however responding to IVF, NS 500cc bolus given. Dr. Ellene Route notified. New orders received. Will continue to monitor.

## 2014-01-19 NOTE — Progress Notes (Signed)
Patient's family member called RN's into patient's room. Pt found unresponsive with blank stare and agonal breathing. Code blue was activated and CPR was started.  Once Code team arrived 0.4mg  of Narcan was given. [name] breathing normalized and pt began to respond. Pt now back to baseline. Alert and oriented x4. Md on call notified. Order's given to transfer pt to step-down, [place].

## 2014-01-19 NOTE — Progress Notes (Signed)
Utilization review completed.  

## 2014-01-20 LAB — GLUCOSE, CAPILLARY
Glucose-Capillary: 240 mg/dL — ABNORMAL HIGH (ref 70–99)
Glucose-Capillary: 285 mg/dL — ABNORMAL HIGH (ref 70–99)

## 2014-01-20 MED ORDER — GUAIFENESIN ER 600 MG PO TB12: 600.0000 mg | ORAL_TABLET | Freq: Two times a day (BID) | ORAL | Status: DC | PRN

## 2014-01-20 MED ORDER — GUAIFENESIN ER 600 MG PO TB12
600.0000 mg | ORAL_TABLET | Freq: Two times a day (BID) | ORAL | Status: DC | PRN
  Administered 2014-01-20: 600 mg via ORAL
  Filled 2014-01-20: qty 1

## 2014-01-20 MED ORDER — OXYCODONE HCL 5 MG PO TABS
5.0000 mg | ORAL_TABLET | ORAL | Status: DC | PRN
  Administered 2014-01-20 – 2014-01-21 (×3): 10 mg via ORAL
  Filled 2014-01-20 (×3): qty 2

## 2014-01-20 MED FILL — Sodium Chloride IV Soln 0.9%: INTRAVENOUS | Qty: 1000 | Status: AC

## 2014-01-20 MED FILL — Heparin Sodium (Porcine) Inj 1000 Unit/ML: INTRAMUSCULAR | Qty: 30 | Status: AC

## 2014-01-20 NOTE — Progress Notes (Signed)
Occupational Therapy Treatment Patient Details Name: Marissa [name] MRN: [id number] DOB: [birth year] Today's Date: 01/20/2014    History of present illness s/p L5-S1 PLIF    OT comments  Pt limited this pm by pain. Educated Aunt on back precautions. Assisted pt with bed mobility. Required vc tonot twist during mobility. Positioned comfortably in R sidelying. Ice to back. Pt appreciative of help/education on positional changes/options. Will plan to see tomorrow to facilitate D/C home with 24/7 S.   Follow Up Recommendations  Home health OT;Supervision/Assistance - 24 hour    Equipment Recommendations  None recommended by OT    Recommendations for Other Services      Precautions / Restrictions Precautions Precautions: Fall;Back Precaution Booklet Issued: Yes (comment) Precaution Comments: pt unable to recall back precautions today  (re-educated x2) Required Braces or Orthoses: Spinal Brace Spinal Brace: Lumbar corset;Applied in sitting position Restrictions Weight Bearing Restrictions: No       Mobility Bed Mobility Overal bed mobility: Needs Assistance Bed Mobility: Rolling Rolling: Supervision Sidelying to sit: Min assist     Sit to sidelying: Min assist General bed mobility comments: vc to not twist during bed mobility  Transfers Overall transfer level: Needs assistance Equipment used: Rolling walker (2 wheeled) Transfers: Sit to/from Stand Sit to Stand: From elevated surface;Supervision;Modified independent (Device/Increase time) Stand pivot transfers: Min assist       General transfer comment: Assist for stability to come to standing    Balance Overall balance assessment: Needs assistance Sitting-balance support: Feet supported Sitting balance-Leahy Scale: Fair     Standing balance support: During functional activity Standing balance-Leahy Scale: Poor Standing balance comment: heavy reliance on RW                   ADL                                         General ADL Comments: aunt educated on back precautions. Pt with increased c/o pain. Assisted pt with bed mobility and achieved comfortable in R sidelying with ice to back.      Vision                     Perception     Praxis      Cognition   Behavior During Therapy: [facility] for tasks assessed/performed Overall Cognitive Status: Within Functional Limits for tasks assessed       Memory: Decreased recall of precautions;Decreased short-term memory               Extremity/Trunk Assessment               Exercises     Shoulder Instructions       General Comments      Pertinent Vitals/ Pain       9/10 back pain. Just received pain meds; repositioned; ice  Home Living                                          Prior Functioning/Environment              Frequency Min 2X/week     Progress Toward Goals  OT Goals(current goals can now be found in the care plan section)  Progress towards OT goals: Progressing toward goals  Acute  Rehab OT Goals Patient Stated Goal: to not have pain OT Goal Formulation: With patient Time For Goal Achievement: 01/24/14 Potential to Achieve Goals: Good  Plan Discharge plan remains appropriate    Co-evaluation                 End of Session     Activity Tolerance Patient limited by pain   Patient Left in bed;with call bell/phone within reach;with family/visitor present   Nurse Communication Mobility status        Time: 1834-3735 OT Time Calculation (min): 16 min  Charges: OT General Charges $OT Visit: 1 Procedure OT Treatments $Self Care/Home Management : 8-22 mins  Roney Jaffe Turner Baillie 01/20/2014, 5:05 PM   Maurie Boettcher, OTR/L  [phone] 01/20/2014

## 2014-01-20 NOTE — Progress Notes (Signed)
Paged Dr. Vertell Limber on-call for Dr. [name] regarding pt's request for something to ease chest congestion. Mucinex order received. Also informed him of pt's 2 instances of pt's asymptomatic ventricular bigeminy since beginning of shift. No orders received regarding pt's rhythym. Will continue to monitor and assess.

## 2014-01-20 NOTE — Progress Notes (Signed)
[place] that are Medicare-Certified and are affiliated with The [place]  Telephone Number Address  [place] has ownership interest in this company; however, you are under no obligation to use this agency. [phone] or [place], [place]  [address] [email or web address]    Agencies that are Medicare-Certified and are not affiliated with The [place]  Telephone Number Address  [facility] [phone] Fax [phone] [place], [place]  [address] [email or web address]  [facility] [phone] [place], [place] [address] [email or web address]  [place] [phone] [address] [place], [place]  [address]  [place] [address] [place], [place] [address]  [facility]  [phone] Fax [phone] [address] [place], [place]  [address] [email or web address]  [place] or [phone] [address] [place], [place] [address]  [facility] [phone] [address] [place], [place] [address] [email or web address]  [facility] [phone]  [address], [place] [address] [email or web address]  [facility] [phone] or [place] [employer], [address] [place], [place]  [phone] [email or web address]    [place], [place]  [address]  [place]  [phone]  Fax [phone] [place], [place]  [address]       Agencies that are not Medicare-Certified and are not affiliated with The [place] Telephone Number Address  [place]. [phone] Fax [phone] [address] [place], [place] [address]  [place] or [phone] Fax [phone] [address] [place], [place]  [address] [email or web address]  [place] [facility] [phone] Fax [phone] [address] [place], [place]  [address]  [facility]  [place], [place] [email or web address]  [place]. [place], [place] [address]  [facility] [phone] Fax [phone] [address] [place], [place] [address]  [facility] [phone] Fax [phone] [address] [place], [place]  [address] [email or web address]  [place]. [phone] Fax [phone] [address] [place], [place]  [address] [email or web address]  [facility] [place]. [phone] [address] [place], [place] [address] [email or web address]  [facility] [phone] [address] [place],   [address] [email or web address]

## 2014-01-20 NOTE — Progress Notes (Signed)
Physical Therapy Treatment Patient Details Name: Marissa [name] MRN: [id number] DOB: [birth year] Today's Date: 01/20/2014    History of Present Illness s/p L5-S1 PLIF     PT Comments    Patient demonstrates limited mobility, was able to ambulate but reports 9/10 pain at all times. Educated patient on importance of OOB mobility. Reinforced back precautions. Will continue to progress as tolerated.  Follow Up Recommendations  Home health PT;Supervision/Assistance - 24 hour     Equipment Recommendations  None recommended by PT    Recommendations for Other Services OT consult     Precautions / Restrictions Precautions Precautions: Fall;Back Precaution Booklet Issued: Yes (comment) Precaution Comments: pt unable to recall back precautions today  (re-educated x2) Required Braces or Orthoses: Spinal Brace Spinal Brace: Lumbar corset;Applied in sitting position Restrictions Weight Bearing Restrictions: No    Mobility  Bed Mobility Overal bed mobility: Needs Assistance Bed Mobility: Rolling;Sidelying to Sit;Sit to Sidelying Rolling: Supervision Sidelying to sit: Min assist     Sit to sidelying: Min assist General bed mobility comments: VCs for positioning, assist for trunk and LE movement  Transfers Overall transfer level: Needs assistance Equipment used: Rolling walker (2 wheeled) Transfers: Sit to/from Stand Sit to Stand: From elevated surface;Supervision;Modified independent (Device/Increase time) Stand pivot transfers: Min assist       General transfer comment: Assist for stability to come to standing  Ambulation/Gait Ambulation/Gait assistance: Min guard;Min assist Ambulation Distance (Feet): 80 Feet Assistive device: Rolling walker (2 wheeled) Gait Pattern/deviations: Decreased step length - left;Step-to pattern;Decreased stride length;Wide base of support;Trunk flexed Gait velocity: very decreased Gait velocity interpretation: Below normal speed for  age/gender General Gait Details: VCs for encouragement and increased cadence for normalized gait, patient very limited by pain at this time   Stairs            Wheelchair Mobility    Modified Rankin (Stroke Patients Only)       Balance Overall balance assessment: Needs assistance Sitting-balance support: Feet supported Sitting balance-Leahy Scale: Fair     Standing balance support: During functional activity Standing balance-Leahy Scale: Poor Standing balance comment: heavy reliance on RW                    Cognition Arousal/Alertness: Awake/alert Behavior During Therapy: Anxious Overall Cognitive Status: Within Functional Limits for tasks assessed       Memory: Decreased recall of precautions;Decreased short-term memory              Exercises      General Comments General comments (skin integrity, edema, etc.): Reeducated on back precautions, Cues for safety with mobility. Encouragement for continued mobility and OOB. Patient very limited by pain at this time. Patient Vitals assessed, BP 120s/70s EOB and standing       Pertinent Vitals/Pain 9/10  BP 120s/70s x2    Home Living                      Prior Function            PT Goals (current goals can now be found in the care plan section) Acute Rehab PT Goals Patient Stated Goal: to not have pain PT Goal Formulation: With patient Time For Goal Achievement: 01/24/14 Potential to Achieve Goals: Good Progress towards PT goals: Progressing toward goals    Frequency  Min 5X/week    PT Plan Current plan remains appropriate    Co-evaluation  End of Session Equipment Utilized During Treatment: Gait belt;Back brace Activity Tolerance: Patient tolerated treatment well Patient left: in bed;with call bell/phone within reach;with family/visitor present     Time: 3338-3291 PT Time Calculation (min): 25 min  Charges:  $Gait Training: 8-22 mins $Therapeutic  Activity: 8-22 mins                    G Codes:      Duncan Dull 2014/02/05, 2:20 PM Alben Deeds, [place] DPT  [phone]

## 2014-01-20 NOTE — Progress Notes (Signed)
Inpatient Diabetes Program Recommendations  AACE/ADA: New Consensus Statement on Inpatient Glycemic Control (2013)  Target Ranges:  Prepandial:   less than 140 mg/dL      Peak postprandial:   less than 180 mg/dL (1-2 hours)      Critically ill patients:  140 - 180 mg/dL     Results for Marissa [name], Marissa [name] (MRN [id number]) as of 01/20/2014 12:13  Ref. Range 01/19/2014 11:27 01/19/2014 15:36 01/19/2014 21:44  Glucose-Capillary Latest Range: 70-99 mg/dL 277 (H) 226 (H) 302 (H)    Results for Marissa [name], Marissa [name] (MRN [id number]) as of 01/20/2014 12:13  Ref. Range 01/20/2014 07:49 01/20/2014 11:32  Glucose-Capillary Latest Range: 70-99 mg/dL 240 (H) 285 (H)     Home DM Meds: Victoza 1.8 mg daily Novolog 10-20 units tid per SSI   **Patient having consistent glucose elevations here in hospital    MD- Please consider adding basal insulin to patient's in-hospital regimen- Lantus 15 units QHS (~0.15 units/kg dosing)   Will follow Marissa Quaker RN, MSN, CDE Diabetes Coordinator Inpatient Diabetes Program Team Pager: [phone] (8a-10p)

## 2014-01-21 LAB — GLUCOSE, CAPILLARY
GLUCOSE-CAPILLARY: 246 mg/dL — AB (ref 70–99)
GLUCOSE-CAPILLARY: 317 mg/dL — AB (ref 70–99)
Glucose-Capillary: 142 mg/dL — ABNORMAL HIGH (ref 70–99)

## 2014-01-21 MED ORDER — OXYCODONE HCL 30 MG PO TABS: 30.0000 mg | ORAL_TABLET | Freq: Four times a day (QID) | ORAL | Status: DC | PRN

## 2014-01-21 MED ORDER — TIZANIDINE HCL 4 MG PO TABS: 4.0000 mg | ORAL_TABLET | Freq: Three times a day (TID) | ORAL | Status: DC

## 2014-01-21 NOTE — Care Management Note (Signed)
    Page 1 of 2   01/21/2014     12:21:55 PM CARE MANAGEMENT NOTE 01/21/2014  Patient:  ARITZEL, KRUSEMARK   Account Number:  [id number]  Date Initiated:  01/20/2014  Documentation initiated by:  Marvetta Gibbons  Subjective/Objective Assessment:   Pt admitted s/p LUMBAR FIVE SACRAL ONE POSTERIOR LUMBAR INTERBODY FUSION     Action/Plan:   PTA pt lived at home- PT/OT evals recommending [place]- NCM to f/u   Anticipated DC Date:  01/21/2014   Anticipated DC Plan:  [place]  CM consult      [facility] Choice  HOME HEALTH   Choice offered to / List presented to:  C-1 Patient        [place] arranged  [place].   Status of service:  Completed, signed off Medicare Important Message given?  YES (If response is "NO", the following Medicare IM given date fields will be blank) Date Medicare IM given:  01/21/2014 Date Additional Medicare IM given:    Discharge Disposition:  [place]  Per UR Regulation:  Reviewed for med. necessity/level of care/duration of stay  If discussed at [place] of Stay Meetings, dates discussed:    Comments:  01/21/14- 1215- Marvetta Gibbons RN, BSN [phone] Pt for d/c home today - HH-PT/OT ordered- in to f/u with pt for [facility] agency of choice- per pt choice- she would like to use [facility] for HH-PT/OT services- referral called to Butch Penny with [facility] for HH-PT/OT- services to begin within 24-48 hr. post discharge.- no further needs- Medicare IM given to pt and signed copy placed in shadow chart.  01/20/14- 1100- Marvetta Gibbons RN, BSN [phone] Attempted to see pt regarding PT recommendations for [facility]- pt asleep- aunt at bedside stated she felt pt would be aggreable- list for [facility] agencies for [facility] [place] left for pt- NCM to return when pt awakens to discuss d/c plans.

## 2014-01-21 NOTE — Progress Notes (Signed)
Additional Medicare IM given to pt and signed copy placed in shadow chart- dated 01/21/13 1215

## 2014-01-21 NOTE — Progress Notes (Signed)
OT Cancellation Note  Patient Details Name: Marissa [name] MRN: [id number] DOB: Dec 26, 1975   Cancelled Treatment:    Reason Eval/Treat Not Completed: Fatigue/lethargy limiting ability to participate. Pt very sleepy and stated that she just returned to bed after sitting up for an hour. OT will re attempt later today as time allows  Britt Bottom 01/21/2014, 11:22 AM

## 2014-01-21 NOTE — Progress Notes (Signed)
Physical Therapy Treatment Patient Details Name: Marissa [name] MRN: [id number] DOB: [birth year] Today's Date: 01/21/2014    History of Present Illness s/p L5-S1 PLIF     PT Comments    Session limited today due to epistaxis when reach EOB. Patient education reviewed regarding back precautions, and patient assisted OOB to chair. Encouraged ambulation with nsg as tolerated.  Follow Up Recommendations  Home health PT;Supervision/Assistance - 24 hour     Equipment Recommendations  None recommended by PT    Recommendations for Other Services OT consult     Precautions / Restrictions Precautions Precautions: Fall;Back Precaution Booklet Issued: Yes (comment) Precaution Comments: pt unable to recall back precautions today  (re-educated x2) Required Braces or Orthoses: Spinal Brace Spinal Brace: Lumbar corset;Applied in sitting position Restrictions Weight Bearing Restrictions: No    Mobility  Bed Mobility Overal bed mobility: Needs Assistance Bed Mobility: Rolling;Sidelying to Sit;Sit to Sidelying Rolling: Supervision Sidelying to sit: Min guard     Sit to sidelying: Min guard General bed mobility comments: VCs for technique, no physical assist provided today  Transfers Overall transfer level: Needs assistance Equipment used: Rolling walker (2 wheeled) Transfers: Sit to/from [facility] Sit to Stand: From elevated surface;Supervision;Modified independent (Device/Increase time) Stand pivot transfers: Min assist       General transfer comment: Assist for stability to come to standing  Ambulation/Gait Ambulation/Gait assistance: Min guard Ambulation Distance (Feet): 6 Feet Assistive device: Rolling walker (2 wheeled)       General Gait Details: pivotal steps to chair   Stairs            Wheelchair Mobility    Modified Rankin (Stroke Patients Only)       Balance     Sitting balance-Leahy Scale: Fair       Standing balance-Leahy  Scale: Fair                      Cognition Arousal/Alertness: Awake/alert Behavior During Therapy: Anxious Overall Cognitive Status: Within Functional Limits for tasks assessed       Memory: Decreased recall of precautions;Decreased short-term memory              Exercises      General Comments General comments (skin integrity, edema, etc.): reviewed back precautions with patient, able to recall 2/3      Pertinent Vitals/Pain 6/10    Home Living                      Prior Function            PT Goals (current goals can now be found in the care plan section) Acute Rehab PT Goals Patient Stated Goal: to not have pain PT Goal Formulation: With patient Time For Goal Achievement: 01/24/14 Potential to Achieve Goals: Good Progress towards PT goals: Progressing toward goals    Frequency  Min 5X/week    PT Plan Current plan remains appropriate    Co-evaluation             End of Session Equipment Utilized During Treatment: Gait belt;Back brace Activity Tolerance: Patient limited by epistaxis (nsg aware)  Patient left: in chair;with call bell/phone within reach;with family/visitor present     Time: 0932-6712 PT Time Calculation (min): 17 min  Charges:  $Therapeutic Activity: 8-22 mins                    G Codes:      Alben Deeds  J 01/21/2014, 11:18 AM Alben Deeds, PT DPT  [phone]

## 2014-01-21 NOTE — Discharge Summary (Signed)
Physician Discharge Summary  Patient ID: Marissa [name] MRN: [id number] DOB/AGE: March 31, 1976 38 y.o.  Admit date: 01/16/2014 Discharge date: 01/21/2014  Admission Diagnoses: lumbar disk herniation    Discharge Diagnoses: same   Discharged Condition: good  Hospital Course: The patient was admitted on 01/16/2014 and taken to the operating room where the patient underwent PLIF. The patient tolerated the procedure well and was taken to the recovery room and then to the floor in stable condition. The hospital course was routine. There were no complications. The wound remained clean dry and intact. Pt had appropriate back soreness. No complaints of leg pain or new N/T/W. The patient remained afebrile with stable vital signs, and tolerated a regular diet. The patient continued to increase activities, and pain was well controlled with oral pain medications.   Consults: None  Significant Diagnostic Studies:  Results for orders placed during the hospital encounter of 01/16/14  MRSA PCR SCREENING      Result Value Ref Range   MRSA by PCR NEGATIVE  NEGATIVE  GLUCOSE, CAPILLARY      Result Value Ref Range   Glucose-Capillary 121 (*) 70 - 99 mg/dL   Comment 1 Documented in Chart     Comment 2 Notify RN    GLUCOSE, CAPILLARY      Result Value Ref Range   Glucose-Capillary 189 (*) 70 - 99 mg/dL  GLUCOSE, CAPILLARY      Result Value Ref Range   Glucose-Capillary 382 (*) 70 - 99 mg/dL  GLUCOSE, RANDOM      Result Value Ref Range   Glucose, Bld 456 (*) 70 - 99 mg/dL  GLUCOSE, CAPILLARY      Result Value Ref Range   Glucose-Capillary 424 (*) 70 - 99 mg/dL   Comment 1 Notify RN     Comment 2 Documented in Chart    GLUCOSE, CAPILLARY      Result Value Ref Range   Glucose-Capillary 417 (*) 70 - 99 mg/dL   Comment 1 Notify RN     Comment 2 Documented in Chart    GLUCOSE, CAPILLARY      Result Value Ref Range   Glucose-Capillary 393 (*) 70 - 99 mg/dL   Comment 1 Documented in Chart     Comment  2 Notify RN    GLUCOSE, CAPILLARY      Result Value Ref Range   Glucose-Capillary 220 (*) 70 - 99 mg/dL   Comment 1 Documented in Chart     Comment 2 Notify RN    GLUCOSE, CAPILLARY      Result Value Ref Range   Glucose-Capillary 214 (*) 70 - 99 mg/dL  GLUCOSE, CAPILLARY      Result Value Ref Range   Glucose-Capillary 210 (*) 70 - 99 mg/dL  GLUCOSE, CAPILLARY      Result Value Ref Range   Glucose-Capillary 322 (*) 70 - 99 mg/dL  GLUCOSE, CAPILLARY      Result Value Ref Range   Glucose-Capillary 190 (*) 70 - 99 mg/dL   Comment 1 Documented in Chart     Comment 2 Notify RN    GLUCOSE, CAPILLARY      Result Value Ref Range   Glucose-Capillary 206 (*) 70 - 99 mg/dL   Comment 1 Notify RN     Comment 2 Documented in Chart    GLUCOSE, CAPILLARY      Result Value Ref Range   Glucose-Capillary 98  70 - 99 mg/dL   Comment 1 Notify RN  Comment 2 Documented in Chart    GLUCOSE, CAPILLARY      Result Value Ref Range   Glucose-Capillary 304 (*) 70 - 99 mg/dL  GLUCOSE, CAPILLARY      Result Value Ref Range   Glucose-Capillary 197 (*) 70 - 99 mg/dL  GLUCOSE, CAPILLARY      Result Value Ref Range   Glucose-Capillary 154 (*) 70 - 99 mg/dL   Comment 1 Documented in Chart     Comment 2 Notify RN    BASIC METABOLIC PANEL      Result Value Ref Range   Sodium 136 (*) 137 - 147 mEq/L   Potassium 5.3  3.7 - 5.3 mEq/L   Chloride 100  96 - 112 mEq/L   CO2 25  19 - 32 mEq/L   Glucose, Bld 242 (*) 70 - 99 mg/dL   BUN 14  6 - 23 mg/dL   Creatinine, Ser 0.73  0.50 - 1.10 mg/dL   Calcium 8.8  8.4 - 10.5 mg/dL   GFR calc non Af Amer >90  >90 mL/min   GFR calc Af Amer >90  >90 mL/min  TROPONIN I      Result Value Ref Range   Troponin I <0.30  <0.30 ng/mL  MAGNESIUM      Result Value Ref Range   Magnesium 2.2  1.5 - 2.5 mg/dL  LACTIC ACID, PLASMA      Result Value Ref Range   Lactic Acid, Venous 1.4  0.5 - 2.2 mmol/L  BLOOD GAS, ARTERIAL      Result Value Ref Range   O2 Content 6.0      Delivery systems NASAL CANNULA     pH, Arterial 7.351  7.350 - 7.450   pCO2 arterial 45.7 (*) 35.0 - 45.0 mmHg   pO2, Arterial 79.2 (*) 80.0 - 100.0 mmHg   Bicarbonate 24.6 (*) 20.0 - 24.0 mEq/L   TCO2 26.0  0 - 100 mmol/L   Acid-base deficit 0.2  0.0 - 2.0 mmol/L   O2 Saturation 95.4     Patient temperature 98.6     Collection site LEFT RADIAL     Drawn by [id number]     Sample type ARTERIAL DRAW     Allens test (pass/fail) PASS  PASS  GLUCOSE, CAPILLARY      Result Value Ref Range   Glucose-Capillary 210 (*) 70 - 99 mg/dL   Comment 1 Notify RN    GLUCOSE, CAPILLARY      Result Value Ref Range   Glucose-Capillary 203 (*) 70 - 99 mg/dL  GLUCOSE, CAPILLARY      Result Value Ref Range   Glucose-Capillary 186 (*) 70 - 99 mg/dL  GLUCOSE, CAPILLARY      Result Value Ref Range   Glucose-Capillary 277 (*) 70 - 99 mg/dL   Comment 1 Documented in Chart     Comment 2 Notify RN    GLUCOSE, CAPILLARY      Result Value Ref Range   Glucose-Capillary 226 (*) 70 - 99 mg/dL   Comment 1 Documented in Chart     Comment 2 Notify RN    GLUCOSE, CAPILLARY      Result Value Ref Range   Glucose-Capillary 302 (*) 70 - 99 mg/dL   Comment 1 Documented in Chart     Comment 2 Notify RN    GLUCOSE, CAPILLARY      Result Value Ref Range   Glucose-Capillary 240 (*) 70 - 99 mg/dL  GLUCOSE, CAPILLARY  Result Value Ref Range   Glucose-Capillary 285 (*) 70 - 99 mg/dL   Comment 1 Documented in Chart     Comment 2 Notify RN    GLUCOSE, CAPILLARY      Result Value Ref Range   Glucose-Capillary 246 (*) 70 - 99 mg/dL  GLUCOSE, CAPILLARY      Result Value Ref Range   Glucose-Capillary 317 (*) 70 - 99 mg/dL  GLUCOSE, CAPILLARY      Result Value Ref Range   Glucose-Capillary 142 (*) 70 - 99 mg/dL   Comment 1 Documented in Chart     Comment 2 Notify RN      Dg Lumbar Spine 2-3 Views  01/16/2014   CLINICAL DATA:  Imaging during lumbar fusion.  EXAM: DG C-ARM 61-120 MIN; LUMBAR SPINE - 2-3 VIEW   COMPARISON:  MRI on 12/05/2013.  FLUOROSCOPY TIME:  44 seconds.  FINDINGS: Intraoperative imaging with a C-arm demonstrates interbody disc prostheses at L5-S1 and placement of pedicular screws bilaterally at the L5 and S1 levels during lumbar fusion.  IMPRESSION: Imaging obtained during PLIF at L5-S1.   Electronically Signed   By: Aletta Edouard M.D.   On: 01/16/2014 11:28   Dg Chest Port 1 View  01/18/2014   CLINICAL DATA:  Apnea.  EXAM: PORTABLE CHEST - 1 VIEW  COMPARISON:  10/31/2013  FINDINGS: Shallow inspiration. Normal heart size and pulmonary vascularity. Visualization of the right lung base is limited due to a patch overlying this area but there appears to be an airspace infiltrate, suggesting pneumonia or atelectasis. No blunting of costophrenic angles. No pneumothorax. Left lung appears grossly clear.  IMPRESSION: Probable focal infiltration in the right lung base.   Electronically Signed   By: Lucienne Capers M.D.   On: 01/18/2014 23:37   Dg C-arm 1-60 Min  01/16/2014   CLINICAL DATA:  Imaging during lumbar fusion.  EXAM: DG C-ARM 61-120 MIN; LUMBAR SPINE - 2-3 VIEW  COMPARISON:  MRI on 12/05/2013.  FLUOROSCOPY TIME:  44 seconds.  FINDINGS: Intraoperative imaging with a C-arm demonstrates interbody disc prostheses at L5-S1 and placement of pedicular screws bilaterally at the L5 and S1 levels during lumbar fusion.  IMPRESSION: Imaging obtained during PLIF at L5-S1.   Electronically Signed   By: Aletta Edouard M.D.   On: 01/16/2014 11:28    Antibiotics:  Anti-infectives   Start     Dose/Rate Route Frequency Ordered Stop   01/16/14 1430  ceFAZolin (ANCEF) IVPB 1 g/50 mL premix     1 g 100 mL/hr over 30 Minutes Intravenous Every 8 hours 01/16/14 1400 01/16/14 2338   01/16/14 0832  bacitracin 50,000 Units in sodium chloride irrigation 0.9 % 500 mL irrigation  Status:  Discontinued       As needed 01/16/14 0832 01/16/14 1156      Discharge Exam: Blood pressure 112/66, pulse 90, temperature  98.1 F (36.7 C), temperature source Oral, resp. rate 25, height 5\' 5"  (1.651 m), weight 110 kg (242 lb 8.1 oz), SpO2 99.00%. Neurologic: Grossly normal Incision CDI  Discharge Medications:     Medication List         amitriptyline 100 MG tablet  Commonly known as:  ELAVIL  Take 100 mg by mouth at bedtime.     atorvastatin 80 MG tablet  Commonly known as:  LIPITOR  Take 80 mg by mouth daily.     carvedilol 6.25 MG tablet  Commonly known as:  COREG  Take 6.25 mg by mouth 2 (  two) times daily with a meal.     gabapentin 300 MG capsule  Commonly known as:  NEURONTIN  Take 600 mg by mouth 3 (three) times daily.     nitroGLYCERIN 0.4 MG SL tablet  Commonly known as:  NITROSTAT  Place 0.4 mg under the tongue every 5 (five) minutes as needed for chest pain.     NOVOLOG FLEXPEN 100 UNIT/ML FlexPen  Generic drug:  insulin aspart  Inject 10-20 Units into the skin 3 (three) times daily with meals. Sliding scale     oxycodone 30 MG immediate release tablet  Commonly known as:  ROXICODONE  Take 1 tablet (30 mg total) by mouth every 6 (six) hours as needed for pain.     SUMAtriptan 50 MG tablet  Commonly known as:  IMITREX  Take 50 mg by mouth every 2 (two) hours as needed for migraine or headache. May repeat in 2 hours if headache persists or recurs.     tiZANidine 4 MG tablet  Commonly known as:  ZANAFLEX  Take 1 tablet (4 mg total) by mouth 3 (three) times daily.     topiramate 100 MG tablet  Commonly known as:  TOPAMAX  Take 100 mg by mouth daily.     topiramate 25 MG tablet  Commonly known as:  TOPAMAX  Take 25 mg by mouth daily.     VICTOZA 18 MG/3ML Sopn  Generic drug:  Liraglutide  Inject 1.8 mg into the skin daily.        Disposition: home   Final Dx: PLIF      Discharge Instructions   Call MD for:  difficulty breathing, headache or visual disturbances    Complete by:  As directed      Call MD for:  persistant nausea and vomiting    Complete by:  As  directed      Call MD for:  redness, tenderness, or signs of infection (pain, swelling, redness, odor or green/yellow discharge around incision site)    Complete by:  As directed      Call MD for:  severe uncontrolled pain    Complete by:  As directed      Call MD for:  temperature >100.4    Complete by:  As directed      Diet - low sodium heart healthy    Complete by:  As directed      Discharge instructions    Complete by:  As directed   No strenuous activity, no heavy lifting, no driving, no bending or twisting     Increase activity slowly    Complete by:  As directed            Follow-up Information   Follow up with CRAM,GARY P, MD. Schedule an appointment as soon as possible for a visit in 2 weeks.   Specialty:  Neurosurgery   Contact information:   [address] [place] [address] [phone]        Signed: Eustace Moore 01/21/2014, 11:49 AM

## 2014-01-21 NOTE — Progress Notes (Signed)
Marissa [name] to be D/C'd Home per MD order.  Discussed with the patient and all questions fully answered.    Medication List         amitriptyline 100 MG tablet  Commonly known as:  ELAVIL  Take 100 mg by mouth at bedtime.     atorvastatin 80 MG tablet  Commonly known as:  LIPITOR  Take 80 mg by mouth daily.     carvedilol 6.25 MG tablet  Commonly known as:  COREG  Take 6.25 mg by mouth 2 (two) times daily with a meal.     gabapentin 300 MG capsule  Commonly known as:  NEURONTIN  Take 600 mg by mouth 3 (three) times daily.     nitroGLYCERIN 0.4 MG SL tablet  Commonly known as:  NITROSTAT  Place 0.4 mg under the tongue every 5 (five) minutes as needed for chest pain.     NOVOLOG FLEXPEN 100 UNIT/ML FlexPen  Generic drug:  insulin aspart  Inject 10-20 Units into the skin 3 (three) times daily with meals. Sliding scale     oxycodone 30 MG immediate release tablet  Commonly known as:  ROXICODONE  Take 1 tablet (30 mg total) by mouth every 6 (six) hours as needed for pain.     SUMAtriptan 50 MG tablet  Commonly known as:  IMITREX  Take 50 mg by mouth every 2 (two) hours as needed for migraine or headache. May repeat in 2 hours if headache persists or recurs.     tiZANidine 4 MG tablet  Commonly known as:  ZANAFLEX  Take 1 tablet (4 mg total) by mouth 3 (three) times daily.     topiramate 100 MG tablet  Commonly known as:  TOPAMAX  Take 100 mg by mouth daily.     topiramate 25 MG tablet  Commonly known as:  TOPAMAX  Take 25 mg by mouth daily.     VICTOZA 18 MG/3ML Sopn  Generic drug:  Liraglutide  Inject 1.8 mg into the skin daily.        VVS, Skin clean, dry and intact without evidence of skin break down, no evidence of skin tears noted. IV catheter discontinued intact. Site without signs and symptoms of complications. Dressing and pressure applied.  An After Visit Summary was printed and given to the patient.  D/c education completed with patient/family  including follow up instructions, medication list, d/c activities limitations if indicated, with other d/c instructions as indicated by MD - patient able to verbalize understanding, all questions fully answered.   Patient instructed to return to ED, call 911, or call MD for any changes in condition.   Patient escorted via [place], and D/C home via private auto.  [facility] Ades [facility] 01/21/2014 12:23 PM

## 2014-01-24 ENCOUNTER — Emergency Department (HOSPITAL_COMMUNITY): Payer: Medicare HMO

## 2014-01-24 ENCOUNTER — Inpatient Hospital Stay (HOSPITAL_COMMUNITY)
Admission: EM | Admit: 2014-01-24 | Discharge: 2014-02-05 | DRG: 853 | Disposition: A | Discharge status: Home Health | Payer: Medicare HMO | Attending: Neurosurgery | Admitting: Neurosurgery

## 2014-01-24 ENCOUNTER — Encounter (HOSPITAL_COMMUNITY): Payer: Self-pay | Admitting: Emergency Medicine

## 2014-01-24 DIAGNOSIS — A4159 Other Gram-negative sepsis: Secondary | ICD-10-CM | POA: Diagnosis present

## 2014-01-24 DIAGNOSIS — E876 Hypokalemia: Secondary | ICD-10-CM | POA: Diagnosis not present

## 2014-01-24 DIAGNOSIS — E1142 Type 2 diabetes mellitus with diabetic polyneuropathy: Secondary | ICD-10-CM

## 2014-01-24 DIAGNOSIS — N170 Acute kidney failure with tubular necrosis: Secondary | ICD-10-CM

## 2014-01-24 DIAGNOSIS — E1149 Type 2 diabetes mellitus with other diabetic neurological complication: Secondary | ICD-10-CM | POA: Diagnosis present

## 2014-01-24 DIAGNOSIS — E1129 Type 2 diabetes mellitus with other diabetic kidney complication: Secondary | ICD-10-CM

## 2014-01-24 DIAGNOSIS — Z981 Arthrodesis status: Secondary | ICD-10-CM

## 2014-01-24 DIAGNOSIS — E872 Acidosis, unspecified: Secondary | ICD-10-CM | POA: Diagnosis not present

## 2014-01-24 DIAGNOSIS — M5126 Other intervertebral disc displacement, lumbar region: Secondary | ICD-10-CM

## 2014-01-24 DIAGNOSIS — Z79899 Other long term (current) drug therapy: Secondary | ICD-10-CM

## 2014-01-24 DIAGNOSIS — I10 Essential (primary) hypertension: Secondary | ICD-10-CM

## 2014-01-24 DIAGNOSIS — Z91018 Allergy to other foods: Secondary | ICD-10-CM

## 2014-01-24 DIAGNOSIS — N1419 Nephropathy induced by other drugs, medicaments and biological substances: Secondary | ICD-10-CM

## 2014-01-24 DIAGNOSIS — R109 Unspecified abdominal pain: Secondary | ICD-10-CM

## 2014-01-24 DIAGNOSIS — Z881 Allergy status to other antibiotic agents status: Secondary | ICD-10-CM

## 2014-01-24 DIAGNOSIS — R259 Unspecified abnormal involuntary movements: Secondary | ICD-10-CM | POA: Diagnosis not present

## 2014-01-24 DIAGNOSIS — Z6841 Body Mass Index (BMI) 40.0 and over, adult: Secondary | ICD-10-CM

## 2014-01-24 DIAGNOSIS — E785 Hyperlipidemia, unspecified: Secondary | ICD-10-CM | POA: Diagnosis present

## 2014-01-24 DIAGNOSIS — M549 Dorsalgia, unspecified: Secondary | ICD-10-CM

## 2014-01-24 DIAGNOSIS — M5489 Other dorsalgia: Secondary | ICD-10-CM

## 2014-01-24 DIAGNOSIS — J810 Acute pulmonary edema: Secondary | ICD-10-CM | POA: Diagnosis not present

## 2014-01-24 DIAGNOSIS — Z9889 Other specified postprocedural states: Secondary | ICD-10-CM

## 2014-01-24 DIAGNOSIS — T368X5A Adverse effect of other systemic antibiotics, initial encounter: Secondary | ICD-10-CM

## 2014-01-24 DIAGNOSIS — T8140XA Infection following a procedure, unspecified, initial encounter: Secondary | ICD-10-CM

## 2014-01-24 DIAGNOSIS — R197 Diarrhea, unspecified: Secondary | ICD-10-CM | POA: Diagnosis not present

## 2014-01-24 DIAGNOSIS — E669 Obesity, unspecified: Secondary | ICD-10-CM

## 2014-01-24 DIAGNOSIS — J9601 Acute respiratory failure with hypoxia: Secondary | ICD-10-CM

## 2014-01-24 DIAGNOSIS — G8929 Other chronic pain: Secondary | ICD-10-CM | POA: Diagnosis present

## 2014-01-24 DIAGNOSIS — E1122 Type 2 diabetes mellitus with diabetic chronic kidney disease: Secondary | ICD-10-CM

## 2014-01-24 DIAGNOSIS — D649 Anemia, unspecified: Secondary | ICD-10-CM | POA: Diagnosis present

## 2014-01-24 DIAGNOSIS — R159 Full incontinence of feces: Secondary | ICD-10-CM | POA: Diagnosis not present

## 2014-01-24 DIAGNOSIS — K219 Gastro-esophageal reflux disease without esophagitis: Secondary | ICD-10-CM | POA: Diagnosis present

## 2014-01-24 DIAGNOSIS — N179 Acute kidney failure, unspecified: Secondary | ICD-10-CM

## 2014-01-24 DIAGNOSIS — L089 Local infection of the skin and subcutaneous tissue, unspecified: Secondary | ICD-10-CM

## 2014-01-24 DIAGNOSIS — J9600 Acute respiratory failure, unspecified whether with hypoxia or hypercapnia: Secondary | ICD-10-CM

## 2014-01-24 DIAGNOSIS — N141 Nephropathy induced by other drugs, medicaments and biological substances: Secondary | ICD-10-CM

## 2014-01-24 DIAGNOSIS — T148XXA Other injury of unspecified body region, initial encounter: Secondary | ICD-10-CM

## 2014-01-24 DIAGNOSIS — I959 Hypotension, unspecified: Secondary | ICD-10-CM | POA: Diagnosis present

## 2014-01-24 DIAGNOSIS — R6520 Severe sepsis without septic shock: Secondary | ICD-10-CM

## 2014-01-24 DIAGNOSIS — I2510 Atherosclerotic heart disease of native coronary artery without angina pectoris: Secondary | ICD-10-CM | POA: Diagnosis present

## 2014-01-24 DIAGNOSIS — R110 Nausea: Secondary | ICD-10-CM

## 2014-01-24 DIAGNOSIS — Z794 Long term (current) use of insulin: Secondary | ICD-10-CM

## 2014-01-24 DIAGNOSIS — A498 Other bacterial infections of unspecified site: Secondary | ICD-10-CM

## 2014-01-24 DIAGNOSIS — A400 Sepsis due to streptococcus, group A: Secondary | ICD-10-CM

## 2014-01-24 DIAGNOSIS — D6489 Other specified anemias: Secondary | ICD-10-CM

## 2014-01-24 DIAGNOSIS — E8779 Other fluid overload: Secondary | ICD-10-CM | POA: Diagnosis not present

## 2014-01-24 DIAGNOSIS — T798XXA Other early complications of trauma, initial encounter: Secondary | ICD-10-CM

## 2014-01-24 DIAGNOSIS — J95821 Acute postprocedural respiratory failure: Secondary | ICD-10-CM | POA: Diagnosis not present

## 2014-01-24 DIAGNOSIS — A409 Streptococcal sepsis, unspecified: Principal | ICD-10-CM | POA: Diagnosis present

## 2014-01-24 DIAGNOSIS — G43909 Migraine, unspecified, not intractable, without status migrainosus: Secondary | ICD-10-CM | POA: Diagnosis present

## 2014-01-24 DIAGNOSIS — R000 Tachycardia, unspecified: Secondary | ICD-10-CM | POA: Diagnosis present

## 2014-01-24 DIAGNOSIS — N390 Urinary tract infection, site not specified: Secondary | ICD-10-CM | POA: Diagnosis not present

## 2014-01-24 DIAGNOSIS — A419 Sepsis, unspecified organism: Secondary | ICD-10-CM

## 2014-01-24 DIAGNOSIS — B950 Streptococcus, group A, as the cause of diseases classified elsewhere: Secondary | ICD-10-CM

## 2014-01-24 DIAGNOSIS — I252 Old myocardial infarction: Secondary | ICD-10-CM

## 2014-01-24 LAB — BASIC METABOLIC PANEL
BUN: 10 mg/dL (ref 6–23)
CO2: 20 mEq/L (ref 19–32)
CREATININE: 0.91 mg/dL (ref 0.50–1.10)
Calcium: 8.6 mg/dL (ref 8.4–10.5)
Chloride: 98 mEq/L (ref 96–112)
GFR calc Af Amer: >90 mL/min (ref >90)
GFR, EST NON AFRICAN AMERICAN: 79 mL/min — AB (ref >90)
Glucose, Bld: 508 mg/dL — ABNORMAL HIGH (ref 70–99)
Potassium: 4.4 mEq/L (ref 3.7–5.3)
SODIUM: 135 meq/L — AB (ref 137–147)

## 2014-01-24 LAB — CBC WITH DIFFERENTIAL/PLATELET
BASOS ABS: 0 10*3/uL (ref 0.0–0.1)
Basophils Relative: 0 % (ref 0–1)
Eosinophils Absolute: 0 10*3/uL (ref 0.0–0.7)
Eosinophils Relative: 0 % (ref 0–5)
HCT: 29.6 % — ABNORMAL LOW (ref 36.0–46.0)
Hemoglobin: 9.8 g/dL — ABNORMAL LOW (ref 12.0–15.0)
LYMPHS PCT: 5 % — AB (ref 12–46)
Lymphs Abs: 1 10*3/uL (ref 0.7–4.0)
MCH: 27.9 pg (ref 26.0–34.0)
MCHC: 33.1 g/dL (ref 30.0–36.0)
MCV: 84.3 fL (ref 78.0–100.0)
MONO ABS: 1 10*3/uL (ref 0.1–1.0)
Monocytes Relative: 5 % (ref 3–12)
NEUTROS PCT: 90 % — AB (ref 43–77)
Neutro Abs: 17.2 10*3/uL — ABNORMAL HIGH (ref 1.7–7.7)
Platelets: 262 10*3/uL (ref 150–400)
RBC: 3.51 MIL/uL — ABNORMAL LOW (ref 3.87–5.11)
RDW: 13.9 % (ref 11.5–15.5)
WBC: 19.2 10*3/uL — ABNORMAL HIGH (ref 4.0–10.5)

## 2014-01-24 LAB — LACTIC ACID, PLASMA: Lactic Acid, Venous: 2.1 mmol/L (ref 0.5–2.2)

## 2014-01-24 MED ORDER — ACETAMINOPHEN 325 MG PO TABS
650.0000 mg | ORAL_TABLET | Freq: Once | ORAL | Status: AC
  Administered 2014-01-24: 650 mg via ORAL
  Filled 2014-01-24: qty 2

## 2014-01-24 MED ORDER — SODIUM CHLORIDE 0.9 % IV BOLUS (SEPSIS)
1000.0000 mL | Freq: Once | INTRAVENOUS | Status: AC
  Administered 2014-01-24: 1000 mL via INTRAVENOUS

## 2014-01-24 MED ORDER — HYDROMORPHONE HCL PF 1 MG/ML IJ SOLN
1.0000 mg | Freq: Once | INTRAMUSCULAR | Status: AC
  Administered 2014-01-24: 1 mg via INTRAVENOUS
  Filled 2014-01-24: qty 1

## 2014-01-24 MED ORDER — INSULIN ASPART 100 UNIT/ML ~~LOC~~ SOLN
5.0000 [IU] | SUBCUTANEOUS | Status: AC
  Administered 2014-01-25: 5 [IU] via INTRAVENOUS
  Filled 2014-01-24: qty 1

## 2014-01-24 MED ORDER — VANCOMYCIN HCL IN DEXTROSE 1-5 GM/200ML-% IV SOLN
1000.0000 mg | Freq: Three times a day (TID) | INTRAVENOUS | Status: DC
  Administered 2014-01-25 – 2014-01-26 (×4): 1000 mg via INTRAVENOUS
  Filled 2014-01-24 (×6): qty 200

## 2014-01-24 MED ORDER — PIPERACILLIN-TAZOBACTAM 3.375 G IVPB
3.3750 g | Freq: Three times a day (TID) | INTRAVENOUS | Status: DC
  Administered 2014-01-25 – 2014-01-26 (×5): 3.375 g via INTRAVENOUS
  Filled 2014-01-24 (×7): qty 50

## 2014-01-24 MED ORDER — VANCOMYCIN HCL 10 G IV SOLR
2000.0000 mg | Freq: Once | INTRAVENOUS | Status: AC
  Administered 2014-01-24: 2000 mg via INTRAVENOUS
  Filled 2014-01-24: qty 2000

## 2014-01-24 MED ORDER — INSULIN ASPART 100 UNIT/ML ~~LOC~~ SOLN
10.0000 [IU] | Freq: Once | SUBCUTANEOUS | Status: AC
  Administered 2014-01-25: 10 [IU] via SUBCUTANEOUS
  Filled 2014-01-24: qty 1

## 2014-01-24 MED ORDER — PIPERACILLIN-TAZOBACTAM 3.375 G IVPB 30 MIN
3.3750 g | Freq: Once | INTRAVENOUS | Status: AC
  Administered 2014-01-24: 3.375 g via INTRAVENOUS
  Filled 2014-01-24: qty 50

## 2014-01-24 NOTE — ED Notes (Signed)
MD at bedside. 

## 2014-01-24 NOTE — ED Notes (Addendum)
01/16/14: Lumbar fusion surgery; d/c'd 01/21/14. Today: mild-mod. purulent drainage, serous sanguinous, and fever. Took tylenol. Back brace on. Lumbar midline incision intact.

## 2014-01-24 NOTE — H&P (Signed)
Marissa [name] is an 38 y.o. female.   Chief Complaint: Wound infection HPI: 38 year old female status post L5-S1 decompression and fusion approximately one week ago presents with fevers extreme pain in her lumbar region with radiation to both lower extremities. Patient has been having wound drainage. [facility] evaluated [facility]. Found to have significant leukocytosis. Patient with some knuckle rigidity. Patient with known history of diabetes who comes in with extremely poor blood group glucose management and a sugar of 506. Patient denies any weakness or sensory loss. She denies any bowel or bladder dysfunction.  Past Medical History  Diagnosis Date  . Hypertension   . Coronary artery disease   . Neuropathy   . Headache(784.0)   . Arthritis   . Vision loss     due to diabetes  . Myocardial infarction 2006    "due to medication"; no evidence of ischemia or infarction by nuclear stress test '11  . Dysrhythmia     tachycardia  . Diabetes mellitus without complication     fasting cbg 50-140s  . GERD (gastroesophageal reflux disease)     otc meds  . Migraines     once/month maybe - can last up to two weeks  . Hyperlipemia     Past Surgical History  Procedure Laterality Date  . Back surgery  2011    Lumbar   . Cervical fusion  2006  . Salpingoophorectomy Bilateral     1 1997. 2nd 2001  . Cardiac catheterization  07/18/2004    50-60% mid LAD, minor luminal irregularities RCA, normal LM and CX, EF 50-55% [facility])  . Lumbar laminectomy/decompression microdiscectomy Right 05/07/2013    Procedure: Right Lumbar one-two laminectomy;  Surgeon: Elaina Hoops, MD;  Location: [place] [facility];  Service: Neurosurgery;  Laterality: Right;  . Carpel tunnel Bilateral   . Lumbar laminectomy/decompression microdiscectomy Left 10/29/2013    Procedure: Left Lumbar five-Sacral one Laminectomy;  Surgeon: Elaina Hoops, MD;  Location: [place] [facility];  Service: Neurosurgery;  Laterality:  Left;    History reviewed. No pertinent family history. Social History:  reports that she has never smoked. She does not have any smokeless tobacco history on file. She reports that she does not drink alcohol or use illicit drugs.  Allergies:  Allergies  Allergen Reactions  . Ciprofloxacin Swelling  . Azithromycin Itching and Rash  . Food Itching and Rash    "Greek yogurt"     (Not in a hospital admission)  Results for orders placed during the hospital encounter of 01/24/14 (from the past 48 hour(s))  CBC WITH DIFFERENTIAL     Status: Abnormal   Collection Time    01/24/14  9:11 PM      Result Value Ref Range   WBC 19.2 (*) 4.0 - 10.5 K/uL   RBC 3.51 (*) 3.87 - 5.11 MIL/uL   Hemoglobin 9.8 (*) 12.0 - 15.0 g/dL   HCT 29.6 (*) 36.0 - 46.0 %   MCV 84.3  78.0 - 100.0 fL   MCH 27.9  26.0 - 34.0 pg   MCHC 33.1  30.0 - 36.0 g/dL   RDW 13.9  11.5 - 15.5 %   Platelets 262  150 - 400 K/uL   Neutrophils Relative % 90 (*) 43 - 77 %   Lymphocytes Relative 5 (*) 12 - 46 %   Monocytes Relative 5  3 - 12 %   Eosinophils Relative 0  0 - 5 %   Basophils Relative 0  0 -  1 %   Neutro Abs 17.2 (*) 1.7 - 7.7 K/uL   Lymphs Abs 1.0  0.7 - 4.0 K/uL   Monocytes Absolute 1.0  0.1 - 1.0 K/uL   Eosinophils Absolute 0.0  0.0 - 0.7 K/uL   Basophils Absolute 0.0  0.0 - 0.1 K/uL   RBC Morphology POLYCHROMASIA PRESENT     Comment: RARE NRBCs   WBC Morphology MILD LEFT SHIFT (1-5% METAS, OCC MYELO, OCC BANDS)    BASIC METABOLIC PANEL     Status: Abnormal   Collection Time    01/24/14  9:11 PM      Result Value Ref Range   Sodium 135 (*) 137 - 147 mEq/L   Potassium 4.4  3.7 - 5.3 mEq/L   Chloride 98  96 - 112 mEq/L   CO2 20  19 - 32 mEq/L   Glucose, Bld 508 (*) 70 - 99 mg/dL   BUN 10  6 - 23 mg/dL   Creatinine, Ser 0.91  0.50 - 1.10 mg/dL   Calcium 8.6  8.4 - 10.5 mg/dL   GFR calc non Af Amer 79 (*) >90 mL/min   GFR calc Af Amer >90  >90 mL/min   Comment: (NOTE)     The eGFR has been  calculated using the CKD EPI equation.     This calculation has not been validated in all clinical situations.     eGFR's persistently <90 mL/min signify possible Chronic Kidney     Disease.  LACTIC ACID, PLASMA     Status: None   Collection Time    01/24/14  9:11 PM      Result Value Ref Range   Lactic Acid, Venous 2.1  0.5 - 2.2 mmol/L   Dg Chest 2 View  01/24/2014   CLINICAL DATA:  Chest pain and weakness.  EXAM: CHEST  2 VIEW  COMPARISON:  One-view chest 01/18/2014.  FINDINGS: Low lung volumes exaggerate the heart size. Patchy low lower airspace disease is present bilaterally, worse on the left. Moderate pulmonary vascular congestion is present. The visualized soft tissues and bony thorax are unremarkable.  IMPRESSION: 1. Patchy lower lobe airspace disease bilaterally, left greater than right. While this may represent atelectasis, infection is not excluded. 2. Low lung volumes. 3. Mild pulmonary vascular congestion.   Electronically Signed   By: Lawrence Santiago M.D.   On: 01/24/2014 21:59    Pertinent items are noted in HPI.  Blood pressure 119/91, pulse 118, temperature 102.8 F (39.3 C), temperature source Oral, resp. rate 27, SpO2 93.00%.  Patient is awake and alert. She is obviously very uncomfortable. Speech is fluent. Neck is mildly stiff. Straight leg raising is positive bilaterally. Motor examination is intact bilaterally. Sensory examination is nonfocal. Wound is per line with drainage emanating from the center of the wound. The wound itself is erythematous around the outside and tender. Examination head ears eyes and throat is unremarkable. Image chest and abdomen is benign. Extremities are free of injury deformity. Assessment/Plan Postoperative lumbar wound infection certainly in the superficial compartment likely in the deep compartment as well. Given the patient's clinical status I recommend taking her to the operating room urgently for wound reexploration and irrigation  debridement. I discussed risks and benefits involved with surgery including but not limited to risk of anesthesia, bleeding, infection, CSF leak, nerve root injury, continued pain, and non-benefit. Patient think about has questions. She appears understand. She wishes to proceed with surgery.  Marissa [name] A 01/24/2014, 11:37 PM

## 2014-01-24 NOTE — Progress Notes (Signed)
ANTIBIOTIC CONSULT NOTE - INITIAL  Pharmacy Consult for vancomycin, Zosyn Indication: lumbar wound infection  Allergies  Allergen Reactions  . Ciprofloxacin Swelling  . Azithromycin Itching and Rash  . Food Itching and Rash    "Greek yogurt"    Patient Measurements: Weight 110 kg on 01/18/14 Height 165 cm on 01/16/14 Vital Signs: Temp: 102.8 F (39.3 C) (06/13 2054) Temp src: Oral (06/13 2054) BP: 106/74 mmHg (06/13 2124) Pulse Rate: 117 (06/13 2124) Labs:  Recent Labs  01/24/14 2111  WBC 19.2*  HGB 9.8*  PLT 262   The CrCl is unknown because both a height and weight (above a minimum accepted value) are required for this calculation. Microbiology: Recent Results (from the past 720 hour(s))  SURGICAL PCR SCREEN     Status: None   Collection Time    01/13/14  1:31 PM      Result Value Ref Range Status   MRSA, PCR NEGATIVE  NEGATIVE Final   Staphylococcus aureus NEGATIVE  NEGATIVE Final   Comment:            The Xpert SA Assay (FDA     approved for NASAL specimens     in patients over 83 years of age),     is one component of     a comprehensive surveillance     program.  Test performance has     been validated by [employer] for patients greater     than or equal to 21 year old.     It is not intended     to diagnose infection nor to     guide or monitor treatment.  MRSA PCR SCREENING     Status: None   Collection Time    01/18/14 11:52 PM      Result Value Ref Range Status   MRSA by PCR NEGATIVE  NEGATIVE Final   Comment:            The GeneXpert MRSA Assay (FDA     approved for NASAL specimens     only), is one component of a     comprehensive MRSA colonization     surveillance program. It is not     intended to diagnose MRSA     infection nor to guide or     monitor treatment for     MRSA infections.    Medical History: Past Medical History  Diagnosis Date  . Hypertension   . Coronary artery disease   . Neuropathy   . Headache(784.0)   .  Arthritis   . Vision loss     due to diabetes  . Myocardial infarction 2006    "due to medication"; no evidence of ischemia or infarction by nuclear stress test '11  . Dysrhythmia     tachycardia  . Diabetes mellitus without complication     fasting cbg 50-140s  . GERD (gastroesophageal reflux disease)     otc meds  . Migraines     once/month maybe - can last up to two weeks  . Hyperlipemia    Assessment: 72 YOF s/p lumbar fusion surgery on 01/16/14 now with purulent drainage and fever to start IV vancomycin and Zosyn per pharmacy dosing. SCr 0.91/estCrCl >100 cc/hr. WBC 19.5, Tmax 102.8  Goal of Therapy:  Vancomycin trough level 15-20 mcg/ml  Plan:  1. Zosyn 3.375g IV once over 30 min, then Zosyn 3.375g IV q8h- each dose over 4 hrs. 2. Vancomycin 2g IV once now,  then vancomycin 1g IV q8h.   Sloan Leiter, PharmD, BCPS Clinical Pharmacist [phone] 01/24/2014,10:02 PM

## 2014-01-24 NOTE — ED Provider Notes (Signed)
CSN: [id number]     Arrival date & time 01/24/14  1955 History   First MD Initiated Contact with Patient 01/24/14 2001     Chief Complaint  Patient presents with  . Post-op Problem  . Drainage from Incision   HPI  Geneen Dieter is a 38 y.o. female with a PMH of CAD, MI, HTN, DM, GERD, migraines, hyperlipidemia, arthritis, and neuropathy who presents to the ED for evaluation of post-op problem and drainage. History was provided by the patient. Patient had a redo decompressive lumbar laminectomy (L5-S1) and posterior lumbar interbody fusion on 01/16/14 with Dr. [name]. She was discharged on 01/21/14. She states she was doing well until today when she developed a fever (max 102.1) at home, fatigue, generalized weakness, and increased post-op pain. Her pain is in her lower back around her incision site, which is described as sharp and worse with movement. She has been taking her oxycodone with no relief in her symptoms. She denies any injuries or trauma. She did not try contacting Dr. [name]. She also states she has had purulent and serosanguinous drainage from the site. Patient also has had difficulty making it to the bathroom on time but denies any urinary or bowel incontinence. Has had increased pain with ambulation but denies any difficulty or focal weakness. She has had a non-productive cough. She denies any chest pain or SOB.     Past Medical History  Diagnosis Date  . Hypertension   . Coronary artery disease   . Neuropathy   . Headache(784.0)   . Arthritis   . Vision loss     due to diabetes  . Myocardial infarction 2006    "due to medication"; no evidence of ischemia or infarction by nuclear stress test '11  . Dysrhythmia     tachycardia  . Diabetes mellitus without complication     fasting cbg 50-140s  . GERD (gastroesophageal reflux disease)     otc meds  . Migraines     once/month maybe - can last up to two weeks  . Hyperlipemia    Past Surgical History  Procedure Laterality Date   . Back surgery  2011    Lumbar   . Cervical fusion  2006  . Salpingoophorectomy Bilateral     1 1997. 2nd 2001  . Cardiac catheterization  07/18/2004    50-60% mid LAD, minor luminal irregularities RCA, normal LM and CX, EF 50-55% [facility])  . Lumbar laminectomy/decompression microdiscectomy Right 05/07/2013    Procedure: Right Lumbar one-two laminectomy;  Surgeon: Elaina Hoops, MD;  Location: [place] [facility];  Service: Neurosurgery;  Laterality: Right;  . Carpel tunnel Bilateral   . Lumbar laminectomy/decompression microdiscectomy Left 10/29/2013    Procedure: Left Lumbar five-Sacral one Laminectomy;  Surgeon: Elaina Hoops, MD;  Location: [place];  Service: Neurosurgery;  Laterality: Left;   History reviewed. No pertinent family history. History  Substance Use Topics  . Smoking status: Never Smoker   . Smokeless tobacco: Not on file  . Alcohol Use: No   OB History   Grav Para Term Preterm Abortions TAB SAB Ect Mult Living                  Review of Systems  Constitutional: Positive for fever, chills, activity change, appetite change and fatigue.  HENT: Negative for congestion, rhinorrhea and sore throat.   Respiratory: Positive for cough. Negative for chest tightness, shortness of breath and wheezing.   Cardiovascular: Negative for chest pain  and leg swelling.  Gastrointestinal: Negative for nausea, vomiting, abdominal pain, diarrhea and constipation.  Genitourinary: Negative for dysuria and difficulty urinating.  Musculoskeletal: Positive for back pain. Negative for myalgias and neck pain.  Skin: Positive for wound.  Neurological: Positive for weakness (generalized). Negative for dizziness, light-headedness and headaches.    Allergies  Ciprofloxacin; Azithromycin; and Food  Home Medications   Prior to Admission medications   Medication Sig Start Date End Date Taking? Authorizing Provider  amitriptyline (ELAVIL) 100 MG tablet Take 100 mg by mouth at  bedtime.    Historical Provider, MD  atorvastatin (LIPITOR) 80 MG tablet Take 80 mg by mouth daily.    Historical Provider, MD  carvedilol (COREG) 6.25 MG tablet Take 6.25 mg by mouth 2 (two) times daily with a meal.    Historical Provider, MD  gabapentin (NEURONTIN) 300 MG capsule Take 600 mg by mouth 3 (three) times daily.     Historical Provider, MD  insulin aspart (NOVOLOG FLEXPEN) 100 UNIT/ML SOPN FlexPen Inject 10-20 Units into the skin 3 (three) times daily with meals. Sliding scale    Historical Provider, MD  Liraglutide (VICTOZA) 18 MG/3ML SOPN Inject 1.8 mg into the skin daily.    Historical Provider, MD  nitroGLYCERIN (NITROSTAT) 0.4 MG SL tablet Place 0.4 mg under the tongue every 5 (five) minutes as needed for chest pain.    Historical Provider, MD  oxycodone (ROXICODONE) 30 MG immediate release tablet Take 1 tablet (30 mg total) by mouth every 6 (six) hours as needed for pain. 01/21/14   Eustace Moore, MD  SUMAtriptan (IMITREX) 50 MG tablet Take 50 mg by mouth every 2 (two) hours as needed for migraine or headache. May repeat in 2 hours if headache persists or recurs.    Historical Provider, MD  tiZANidine (ZANAFLEX) 4 MG tablet Take 1 tablet (4 mg total) by mouth 3 (three) times daily. 01/21/14   Eustace Moore, MD  topiramate (TOPAMAX) 100 MG tablet Take 100 mg by mouth daily.    Historical Provider, MD  topiramate (TOPAMAX) 25 MG tablet Take 25 mg by mouth daily.    Historical Provider, MD   BP 120/69  Pulse 122  Temp(Src) 98.8 F (37.1 C) (Oral)  Resp 24  SpO2 100%  Filed Vitals:   01/24/14 2330 01/24/14 2339 01/24/14 2345 01/25/14 0245  BP: 127/72 127/72 118/65 150/81  Pulse: 122 118  112  Temp:  101.6 F (38.7 C)    TempSrc:  Oral    Resp: 21 26 33 14  SpO2: 98% 96%  99%    Physical Exam  Nursing note and vitals reviewed. Constitutional: She is oriented to person, place, and time. She appears well-developed and well-nourished. No distress.  Ill appearing  HENT:   Head: Normocephalic and atraumatic.  Right Ear: External ear normal.  Left Ear: External ear normal.  Nose: Nose normal.  Mouth/Throat: Oropharynx is clear and moist.  Eyes: Conjunctivae are normal. Right eye exhibits no discharge. Left eye exhibits no discharge.  Neck: Normal range of motion. Neck supple.  Cardiovascular: Regular rhythm and normal heart sounds.  Exam reveals no gallop and no friction rub.   No murmur heard. Tachycardic   Pulmonary/Chest: Effort normal and breath sounds normal. No respiratory distress. She has no wheezes. She has no rales. She exhibits no tenderness.  Abdominal: Soft. Bowel sounds are normal. She exhibits no distension and no mass. There is no tenderness. There is no rebound and no guarding.  Musculoskeletal: Normal  range of motion. She exhibits tenderness. She exhibits no edema.       Back:  Diffuse tenderness to palpation to the lumbar spine and paraspinal muscles. Pain worse with movement and sitting up. Strength 5/5 in the upper and lower extremities bilaterally.   Neurological: She is alert and oriented to person, place, and time.  Sensation intact in the LE  Skin: Skin is warm and dry. She is not diaphoretic.  4 cm vertical post-op wound to the lower lumbar spine with purulent drainage at the inferior wound edges. No active drainage. No dehiscence. Mild surrounding erythema. Puncture wound to the left of the incision with active serosanguinous drainage. Skin warm to the touch.      ED Course  Procedures (including critical care time) Labs Review Labs Reviewed - No data to display  Imaging Review Dg Chest 2 View  01/24/2014   CLINICAL DATA:  Chest pain and weakness.  EXAM: CHEST  2 VIEW  COMPARISON:  One-view chest 01/18/2014.  FINDINGS: Low lung volumes exaggerate the heart size. Patchy low lower airspace disease is present bilaterally, worse on the left. Moderate pulmonary vascular congestion is present. The visualized soft tissues and bony  thorax are unremarkable.  IMPRESSION: 1. Patchy lower lobe airspace disease bilaterally, left greater than right. While this may represent atelectasis, infection is not excluded. 2. Low lung volumes. 3. Mild pulmonary vascular congestion.   Electronically Signed   By: Lawrence Santiago M.D.   On: 01/24/2014 21:59     EKG Interpretation None     ] CBC WITH DIFFERENTIAL      Result Value Ref Range   WBC 19.2 (*) 4.0 - 10.5 K/uL   RBC 3.51 (*) 3.87 - 5.11 MIL/uL   Hemoglobin 9.8 (*) 12.0 - 15.0 g/dL   HCT 29.6 (*) 36.0 - 46.0 %   MCV 84.3  78.0 - 100.0 fL   MCH 27.9  26.0 - 34.0 pg   MCHC 33.1  30.0 - 36.0 g/dL   RDW 13.9  11.5 - 15.5 %   Platelets 262  150 - 400 K/uL   Neutrophils Relative % 90 (*) 43 - 77 %   Lymphocytes Relative 5 (*) 12 - 46 %   Monocytes Relative 5  3 - 12 %   Eosinophils Relative 0  0 - 5 %   Basophils Relative 0  0 - 1 %   Neutro Abs 17.2 (*) 1.7 - 7.7 K/uL   Lymphs Abs 1.0  0.7 - 4.0 K/uL   Monocytes Absolute 1.0  0.1 - 1.0 K/uL   Eosinophils Absolute 0.0  0.0 - 0.7 K/uL   Basophils Absolute 0.0  0.0 - 0.1 K/uL   RBC Morphology POLYCHROMASIA PRESENT     WBC Morphology MILD LEFT SHIFT (1-5% METAS, OCC MYELO, OCC BANDS)    BASIC METABOLIC PANEL      Result Value Ref Range   Sodium 135 (*) 137 - 147 mEq/L   Potassium 4.4  3.7 - 5.3 mEq/L   Chloride 98  96 - 112 mEq/L   CO2 20  19 - 32 mEq/L   Glucose, Bld 508 (*) 70 - 99 mg/dL   BUN 10  6 - 23 mg/dL   Creatinine, Ser 0.91  0.50 - 1.10 mg/dL   Calcium 8.6  8.4 - 10.5 mg/dL   GFR calc non Af Amer 79 (*) >90 mL/min   GFR calc Af Amer >90  >90 mL/min  LACTIC ACID, PLASMA  Result Value Ref Range   Lactic Acid, Venous 2.1  0.5 - 2.2 mmol/L    MDM   Trenee Igoe is a 38 y.o. female with a PMH of CAD, MI, HTN, DM, GERD, migraines, hyperlipidemia, arthritis, and neuropathy who presents to the ED for evaluation of post-op problem and drainage. Patient likely has a post-op infection from her recent lumbar  spine surgery. Neurosurgery consulted and is taking the patient to the OR. Patient febrile and tachycardic in the ED. Tachycardia likely due to fever. Patient also has leukocytosis (19.2) and anemia (9.8 & 29.6). Lactic acid 2.1. BP stable. Chest x-ray suggestive of a developing pneumonia vs atelectasis, however, pneumonia as the cause of her symptoms is less likely. Blood cultures drawn in the ED. Patient started on Vanco and Zosyn. Patient admitted for further evaluation and management.   Consults  11:00 PM = Spoke with Dr. Annette Stable who will come to evaluate the patient      Final impressions: 1. Post op infection   2. Sepsis      Mercy Moore PA-C   This patient was discussed with Dr. Randalyn Rhea, PA-C 01/25/14 [phone]

## 2014-01-25 ENCOUNTER — Encounter (HOSPITAL_COMMUNITY): Payer: Medicare HMO | Admitting: Anesthesiology

## 2014-01-25 ENCOUNTER — Inpatient Hospital Stay (HOSPITAL_COMMUNITY): Payer: Medicare HMO

## 2014-01-25 ENCOUNTER — Encounter (HOSPITAL_COMMUNITY): Payer: Self-pay | Admitting: Anesthesiology

## 2014-01-25 ENCOUNTER — Emergency Department (HOSPITAL_COMMUNITY): Payer: Medicare HMO | Admitting: Anesthesiology

## 2014-01-25 ENCOUNTER — Encounter (HOSPITAL_COMMUNITY)
Admission: EM | Disposition: A | Discharge status: Home Health | Payer: Self-pay | Source: Home / Self Care | Attending: Neurosurgery

## 2014-01-25 DIAGNOSIS — I2510 Atherosclerotic heart disease of native coronary artery without angina pectoris: Secondary | ICD-10-CM | POA: Diagnosis present

## 2014-01-25 DIAGNOSIS — A419 Sepsis, unspecified organism: Secondary | ICD-10-CM

## 2014-01-25 DIAGNOSIS — E785 Hyperlipidemia, unspecified: Secondary | ICD-10-CM | POA: Diagnosis present

## 2014-01-25 DIAGNOSIS — I10 Essential (primary) hypertension: Secondary | ICD-10-CM | POA: Diagnosis present

## 2014-01-25 DIAGNOSIS — R000 Tachycardia, unspecified: Secondary | ICD-10-CM | POA: Diagnosis present

## 2014-01-25 DIAGNOSIS — A409 Streptococcal sepsis, unspecified: Secondary | ICD-10-CM | POA: Diagnosis present

## 2014-01-25 DIAGNOSIS — N390 Urinary tract infection, site not specified: Secondary | ICD-10-CM | POA: Diagnosis not present

## 2014-01-25 DIAGNOSIS — D649 Anemia, unspecified: Secondary | ICD-10-CM | POA: Diagnosis not present

## 2014-01-25 DIAGNOSIS — R110 Nausea: Secondary | ICD-10-CM | POA: Diagnosis not present

## 2014-01-25 DIAGNOSIS — T148XXA Other injury of unspecified body region, initial encounter: Secondary | ICD-10-CM

## 2014-01-25 DIAGNOSIS — Z981 Arthrodesis status: Secondary | ICD-10-CM | POA: Diagnosis not present

## 2014-01-25 DIAGNOSIS — L089 Local infection of the skin and subcutaneous tissue, unspecified: Secondary | ICD-10-CM | POA: Diagnosis present

## 2014-01-25 DIAGNOSIS — I252 Old myocardial infarction: Secondary | ICD-10-CM | POA: Diagnosis not present

## 2014-01-25 DIAGNOSIS — E1129 Type 2 diabetes mellitus with other diabetic kidney complication: Secondary | ICD-10-CM | POA: Diagnosis present

## 2014-01-25 DIAGNOSIS — J9601 Acute respiratory failure with hypoxia: Secondary | ICD-10-CM | POA: Diagnosis not present

## 2014-01-25 DIAGNOSIS — Z79899 Other long term (current) drug therapy: Secondary | ICD-10-CM | POA: Diagnosis not present

## 2014-01-25 DIAGNOSIS — J95821 Acute postprocedural respiratory failure: Secondary | ICD-10-CM | POA: Diagnosis not present

## 2014-01-25 DIAGNOSIS — E872 Acidosis, unspecified: Secondary | ICD-10-CM | POA: Diagnosis not present

## 2014-01-25 DIAGNOSIS — G43909 Migraine, unspecified, not intractable, without status migrainosus: Secondary | ICD-10-CM | POA: Diagnosis present

## 2014-01-25 DIAGNOSIS — R197 Diarrhea, unspecified: Secondary | ICD-10-CM | POA: Diagnosis not present

## 2014-01-25 DIAGNOSIS — T8140XA Infection following a procedure, unspecified, initial encounter: Secondary | ICD-10-CM | POA: Diagnosis present

## 2014-01-25 DIAGNOSIS — E1142 Type 2 diabetes mellitus with diabetic polyneuropathy: Secondary | ICD-10-CM | POA: Diagnosis present

## 2014-01-25 DIAGNOSIS — K219 Gastro-esophageal reflux disease without esophagitis: Secondary | ICD-10-CM | POA: Diagnosis present

## 2014-01-25 DIAGNOSIS — I959 Hypotension, unspecified: Secondary | ICD-10-CM | POA: Diagnosis present

## 2014-01-25 DIAGNOSIS — J9600 Acute respiratory failure, unspecified whether with hypoxia or hypercapnia: Secondary | ICD-10-CM

## 2014-01-25 DIAGNOSIS — G8929 Other chronic pain: Secondary | ICD-10-CM | POA: Diagnosis present

## 2014-01-25 DIAGNOSIS — E876 Hypokalemia: Secondary | ICD-10-CM | POA: Diagnosis not present

## 2014-01-25 DIAGNOSIS — R259 Unspecified abnormal involuntary movements: Secondary | ICD-10-CM | POA: Diagnosis not present

## 2014-01-25 DIAGNOSIS — M5126 Other intervertebral disc displacement, lumbar region: Secondary | ICD-10-CM | POA: Diagnosis present

## 2014-01-25 DIAGNOSIS — R159 Full incontinence of feces: Secondary | ICD-10-CM | POA: Diagnosis not present

## 2014-01-25 DIAGNOSIS — A4159 Other Gram-negative sepsis: Secondary | ICD-10-CM | POA: Diagnosis present

## 2014-01-25 DIAGNOSIS — N170 Acute kidney failure with tubular necrosis: Secondary | ICD-10-CM | POA: Diagnosis not present

## 2014-01-25 DIAGNOSIS — E8779 Other fluid overload: Secondary | ICD-10-CM | POA: Diagnosis not present

## 2014-01-25 DIAGNOSIS — J810 Acute pulmonary edema: Secondary | ICD-10-CM | POA: Diagnosis not present

## 2014-01-25 DIAGNOSIS — Z6841 Body Mass Index (BMI) 40.0 and over, adult: Secondary | ICD-10-CM | POA: Diagnosis not present

## 2014-01-25 DIAGNOSIS — Z794 Long term (current) use of insulin: Secondary | ICD-10-CM | POA: Diagnosis not present

## 2014-01-25 DIAGNOSIS — E1149 Type 2 diabetes mellitus with other diabetic neurological complication: Secondary | ICD-10-CM | POA: Diagnosis present

## 2014-01-25 DIAGNOSIS — Z91018 Allergy to other foods: Secondary | ICD-10-CM | POA: Diagnosis not present

## 2014-01-25 DIAGNOSIS — Z881 Allergy status to other antibiotic agents status: Secondary | ICD-10-CM | POA: Diagnosis not present

## 2014-01-25 HISTORY — PX: LUMBAR WOUND DEBRIDEMENT: SHX1988

## 2014-01-25 LAB — BLOOD GAS, ARTERIAL
Acid-base deficit: 5.9 mmol/L — ABNORMAL HIGH (ref 0.0–2.0)
BICARBONATE: 19.3 meq/L — AB (ref 20.0–24.0)
Drawn by: 41308
FIO2: 60 %
MECHVT: 450 mL
O2 Saturation: 99.9 %
PEEP: 5 cmH2O
PH ART: 7.29 — AB (ref 7.350–7.450)
Patient temperature: 100.2
RATE: 14 resp/min
TCO2: 20.5 mmol/L (ref 0–100)
pCO2 arterial: 42 mmHg (ref 35.0–45.0)
pO2, Arterial: 197 mmHg — ABNORMAL HIGH (ref 80.0–100.0)

## 2014-01-25 LAB — TYPE AND SCREEN
ABO/RH(D): B POS
ANTIBODY SCREEN: NEGATIVE

## 2014-01-25 LAB — URINALYSIS, ROUTINE W REFLEX MICROSCOPIC
BILIRUBIN URINE: NEGATIVE
Glucose, UA: >1000 mg/dL — AB
HGB URINE DIPSTICK: NEGATIVE
KETONES UR: NEGATIVE mg/dL
Leukocytes, UA: NEGATIVE
Nitrite: NEGATIVE
PROTEIN: NEGATIVE mg/dL
Specific Gravity, Urine: 1.02 (ref 1.005–1.030)
UROBILINOGEN UA: 1 mg/dL (ref 0.0–1.0)
pH: 7 (ref 5.0–8.0)

## 2014-01-25 LAB — GLUCOSE, CAPILLARY
GLUCOSE-CAPILLARY: 136 mg/dL — AB (ref 70–99)
GLUCOSE-CAPILLARY: 162 mg/dL — AB (ref 70–99)
Glucose-Capillary: 135 mg/dL — ABNORMAL HIGH (ref 70–99)
Glucose-Capillary: 167 mg/dL — ABNORMAL HIGH (ref 70–99)
Glucose-Capillary: 167 mg/dL — ABNORMAL HIGH (ref 70–99)
Glucose-Capillary: 175 mg/dL — ABNORMAL HIGH (ref 70–99)
Glucose-Capillary: 185 mg/dL — ABNORMAL HIGH (ref 70–99)
Glucose-Capillary: 199 mg/dL — ABNORMAL HIGH (ref 70–99)
Glucose-Capillary: 213 mg/dL — ABNORMAL HIGH (ref 70–99)
Glucose-Capillary: 230 mg/dL — ABNORMAL HIGH (ref 70–99)
Glucose-Capillary: 305 mg/dL — ABNORMAL HIGH (ref 70–99)
Glucose-Capillary: 335 mg/dL — ABNORMAL HIGH (ref 70–99)
Glucose-Capillary: 345 mg/dL — ABNORMAL HIGH (ref 70–99)

## 2014-01-25 LAB — POCT I-STAT 7, (LYTES, BLD GAS, ICA,H+H)
ACID-BASE DEFICIT: 11 mmol/L — AB (ref 0.0–2.0)
Acid-base deficit: 6 mmol/L — ABNORMAL HIGH (ref 0.0–2.0)
BICARBONATE: 15.5 meq/L — AB (ref 20.0–24.0)
Bicarbonate: 21.1 mEq/L (ref 20.0–24.0)
Calcium, Ion: 1.03 mmol/L — ABNORMAL LOW (ref 1.12–1.23)
Calcium, Ion: 1.3 mmol/L — ABNORMAL HIGH (ref 1.12–1.23)
HCT: 21 % — ABNORMAL LOW (ref 36.0–46.0)
HCT: 22 % — ABNORMAL LOW (ref 36.0–46.0)
HEMOGLOBIN: 7.5 g/dL — AB (ref 12.0–15.0)
Hemoglobin: 7.1 g/dL — ABNORMAL LOW (ref 12.0–15.0)
O2 SAT: 100 %
O2 Saturation: 95 %
PCO2 ART: 50.9 mmHg — AB (ref 35.0–45.0)
PH ART: 7.226 — AB (ref 7.350–7.450)
PO2 ART: 370 mmHg — AB (ref 80.0–100.0)
POTASSIUM: 3.1 meq/L — AB (ref 3.7–5.3)
Potassium: 3.2 mEq/L — ABNORMAL LOW (ref 3.7–5.3)
Sodium: 140 mEq/L (ref 137–147)
Sodium: 140 mEq/L (ref 137–147)
TCO2: 17 mmol/L (ref 0–100)
TCO2: 23 mmol/L (ref 0–100)
pCO2 arterial: 34.9 mmHg — ABNORMAL LOW (ref 35.0–45.0)
pH, Arterial: 7.255 — ABNORMAL LOW (ref 7.350–7.450)
pO2, Arterial: 89 mmHg (ref 80.0–100.0)

## 2014-01-25 LAB — TROPONIN I

## 2014-01-25 LAB — CBC
HCT: 29.7 % — ABNORMAL LOW (ref 36.0–46.0)
HEMOGLOBIN: 9.6 g/dL — AB (ref 12.0–15.0)
MCH: 27.3 pg (ref 26.0–34.0)
MCHC: 32.3 g/dL (ref 30.0–36.0)
MCV: 84.4 fL (ref 78.0–100.0)
Platelets: 222 10*3/uL (ref 150–400)
RBC: 3.52 MIL/uL — AB (ref 3.87–5.11)
RDW: 14 % (ref 11.5–15.5)
WBC: 18.3 10*3/uL — ABNORMAL HIGH (ref 4.0–10.5)

## 2014-01-25 LAB — COMPREHENSIVE METABOLIC PANEL
ALK PHOS: 79 U/L (ref 39–117)
ALT: 12 U/L (ref 0–35)
AST: 15 U/L (ref 0–37)
Albumin: 2 g/dL — ABNORMAL LOW (ref 3.5–5.2)
BILIRUBIN TOTAL: 1 mg/dL (ref 0.3–1.2)
BUN: 10 mg/dL (ref 6–23)
CHLORIDE: 106 meq/L (ref 96–112)
CO2: 19 meq/L (ref 19–32)
Calcium: 8.3 mg/dL — ABNORMAL LOW (ref 8.4–10.5)
Creatinine, Ser: 0.95 mg/dL (ref 0.50–1.10)
GFR calc Af Amer: 87 mL/min — ABNORMAL LOW (ref >90)
GFR calc non Af Amer: 75 mL/min — ABNORMAL LOW (ref >90)
Glucose, Bld: 208 mg/dL — ABNORMAL HIGH (ref 70–99)
Potassium: 3.9 mEq/L (ref 3.7–5.3)
Sodium: 141 mEq/L (ref 137–147)
Total Protein: 5.8 g/dL — ABNORMAL LOW (ref 6.0–8.3)

## 2014-01-25 LAB — GRAM STAIN

## 2014-01-25 LAB — PREPARE RBC (CROSSMATCH)

## 2014-01-25 LAB — APTT: aPTT: 30 seconds (ref 24–37)

## 2014-01-25 LAB — URINE MICROSCOPIC-ADD ON

## 2014-01-25 LAB — FIBRINOGEN: Fibrinogen: 749 mg/dL — ABNORMAL HIGH (ref 204–475)

## 2014-01-25 LAB — MRSA PCR SCREENING: MRSA by PCR: NEGATIVE

## 2014-01-25 LAB — CORTISOL: CORTISOL PLASMA: 19.2 ug/dL

## 2014-01-25 LAB — PROTIME-INR
INR: 1.38 (ref 0.00–1.49)
Prothrombin Time: 16.6 seconds — ABNORMAL HIGH (ref 11.6–15.2)

## 2014-01-25 LAB — LACTIC ACID, PLASMA: Lactic Acid, Venous: 1.6 mmol/L (ref 0.5–2.2)

## 2014-01-25 SURGERY — LUMBAR WOUND DEBRIDEMENT
Anesthesia: General | Site: Back

## 2014-01-25 MED ORDER — ACETAMINOPHEN 325 MG PO TABS: 325.0000 mg | ORAL_TABLET | ORAL | Status: DC | PRN

## 2014-01-25 MED ORDER — SUCCINYLCHOLINE CHLORIDE 20 MG/ML IJ SOLN
INTRAMUSCULAR | Status: DC | PRN
  Administered 2014-01-25: 60 mg via INTRAVENOUS

## 2014-01-25 MED ORDER — ACETAMINOPHEN 325 MG PO TABS
650.0000 mg | ORAL_TABLET | ORAL | Status: DC | PRN
  Administered 2014-01-25 – 2014-01-31 (×5): 650 mg via ORAL
  Filled 2014-01-25 (×5): qty 2

## 2014-01-25 MED ORDER — OXYCODONE HCL 5 MG/5ML PO SOLN: 5.0000 mg | Freq: Once | ORAL | Status: DC | PRN

## 2014-01-25 MED ORDER — THROMBIN 20000 UNITS EX SOLR
CUTANEOUS | Status: DC | PRN
  Administered 2014-01-25: 02:00:00 via TOPICAL

## 2014-01-25 MED ORDER — PHENYLEPHRINE HCL 10 MG/ML IJ SOLN
INTRAMUSCULAR | Status: DC | PRN
  Administered 2014-01-25 (×2): 80 ug via INTRAVENOUS
  Administered 2014-01-25: 120 ug via INTRAVENOUS

## 2014-01-25 MED ORDER — SODIUM CHLORIDE 0.9 % IV SOLN
INTRAVENOUS | Status: DC | PRN
  Administered 2014-01-25 (×2): via INTRAVENOUS

## 2014-01-25 MED ORDER — PIPERACILLIN-TAZOBACTAM 3.375 G IVPB 30 MIN: 3.3750 g | Freq: Once | INTRAVENOUS | Status: DC

## 2014-01-25 MED ORDER — NALOXONE HCL 0.4 MG/ML IJ SOLN: 0.4000 mg | INTRAMUSCULAR | Status: DC | PRN

## 2014-01-25 MED ORDER — DIPHENHYDRAMINE HCL 50 MG/ML IJ SOLN: 12.5000 mg | Freq: Four times a day (QID) | INTRAMUSCULAR | Status: DC | PRN

## 2014-01-25 MED ORDER — LIRAGLUTIDE 18 MG/3ML ~~LOC~~ SOPN
1.8000 mg | PEN_INJECTOR | Freq: Every day | SUBCUTANEOUS | Status: DC
  Administered 2014-01-31: 1.8 mg via SUBCUTANEOUS
  Filled 2014-01-25: qty 1

## 2014-01-25 MED ORDER — FENTANYL CITRATE 0.05 MG/ML IJ SOLN
50.0000 ug | INTRAMUSCULAR | Status: DC | PRN
Start: 2014-01-25 — End: 2014-01-26
  Administered 2014-01-25 (×2): 50 ug via INTRAVENOUS
  Filled 2014-01-25 (×2): qty 2

## 2014-01-25 MED ORDER — TOPIRAMATE 25 MG PO TABS: 25.0000 mg | ORAL_TABLET | Freq: Every day | ORAL | Status: DC

## 2014-01-25 MED ORDER — INSULIN ASPART 100 UNIT/ML ~~LOC~~ SOLN
SUBCUTANEOUS | Status: AC
  Filled 2014-01-25: qty 1

## 2014-01-25 MED ORDER — PROPOFOL 10 MG/ML IV BOLUS
INTRAVENOUS | Status: AC
  Filled 2014-01-25: qty 20

## 2014-01-25 MED ORDER — SODIUM CHLORIDE 0.9 % IJ SOLN: 9.0000 mL | INTRAMUSCULAR | Status: DC | PRN

## 2014-01-25 MED ORDER — ATORVASTATIN CALCIUM 80 MG PO TABS
80.0000 mg | ORAL_TABLET | Freq: Every day | ORAL | Status: DC
  Administered 2014-01-25 – 2014-02-04 (×10): 80 mg
  Filled 2014-01-25 (×13): qty 1

## 2014-01-25 MED ORDER — POLYETHYLENE GLYCOL 3350 17 G PO PACK
17.0000 g | PACK | Freq: Every day | ORAL | Status: DC | PRN
  Filled 2014-01-25: qty 1

## 2014-01-25 MED ORDER — ACETAMINOPHEN 650 MG RE SUPP
650.0000 mg | RECTAL | Status: DC | PRN
  Administered 2014-01-25: 650 mg via RECTAL
  Filled 2014-01-25: qty 1

## 2014-01-25 MED ORDER — LIDOCAINE HCL (CARDIAC) 20 MG/ML IV SOLN
INTRAVENOUS | Status: AC
  Filled 2014-01-25: qty 5

## 2014-01-25 MED ORDER — 0.9 % SODIUM CHLORIDE (POUR BTL) OPTIME
TOPICAL | Status: DC | PRN
  Administered 2014-01-25: 1000 mL

## 2014-01-25 MED ORDER — PHENYLEPHRINE HCL 10 MG/ML IJ SOLN
10.0000 mg | INTRAVENOUS | Status: DC | PRN
  Administered 2014-01-25: 40 ug/min via INTRAVENOUS

## 2014-01-25 MED ORDER — SODIUM CHLORIDE 0.9 % IV SOLN
250.0000 mL | INTRAVENOUS | Status: DC
  Administered 2014-01-25: 250 mL via INTRAVENOUS

## 2014-01-25 MED ORDER — CEFAZOLIN SODIUM 1-5 GM-% IV SOLN: 1.0000 g | Freq: Three times a day (TID) | INTRAVENOUS | Status: DC

## 2014-01-25 MED ORDER — TOPIRAMATE 25 MG PO TABS
125.0000 mg | ORAL_TABLET | Freq: Every day | ORAL | Status: DC
  Administered 2014-01-25 – 2014-02-04 (×10): 125 mg
  Filled 2014-01-25 (×14): qty 1

## 2014-01-25 MED ORDER — SUMATRIPTAN SUCCINATE 50 MG PO TABS
50.0000 mg | ORAL_TABLET | ORAL | Status: DC | PRN
  Filled 2014-01-25: qty 1

## 2014-01-25 MED ORDER — BISACODYL 10 MG RE SUPP: 10.0000 mg | Freq: Every day | RECTAL | Status: DC | PRN

## 2014-01-25 MED ORDER — PHENYLEPHRINE HCL 10 MG/ML IJ SOLN: 10.0000 mg | INTRAVENOUS | Status: DC | PRN

## 2014-01-25 MED ORDER — ACETAMINOPHEN 160 MG/5ML PO SOLN: 325.0000 mg | ORAL | Status: DC | PRN

## 2014-01-25 MED ORDER — SUCCINYLCHOLINE CHLORIDE 20 MG/ML IJ SOLN
INTRAMUSCULAR | Status: AC
  Filled 2014-01-25: qty 1

## 2014-01-25 MED ORDER — ONDANSETRON HCL 4 MG/2ML IJ SOLN
4.0000 mg | INTRAMUSCULAR | Status: DC | PRN
  Filled 2014-01-25: qty 2

## 2014-01-25 MED ORDER — OXYCODONE HCL 5 MG PO TABS
30.0000 mg | ORAL_TABLET | Freq: Four times a day (QID) | ORAL | Status: DC | PRN
  Administered 2014-01-25: 30 mg via ORAL
  Administered 2014-01-26: 15 mg via ORAL
  Filled 2014-01-25 (×2): qty 6

## 2014-01-25 MED ORDER — FENTANYL CITRATE 0.05 MG/ML IJ SOLN
INTRAMUSCULAR | Status: DC | PRN
  Administered 2014-01-25: 50 ug via INTRAVENOUS
  Administered 2014-01-25: 200 ug via INTRAVENOUS
  Administered 2014-01-25: 100 ug via INTRAVENOUS
  Administered 2014-01-25: 150 ug via INTRAVENOUS

## 2014-01-25 MED ORDER — PHENOL 1.4 % MT LIQD: 1.0000 | OROMUCOSAL | Status: DC | PRN

## 2014-01-25 MED ORDER — VECURONIUM BROMIDE 10 MG IV SOLR
INTRAVENOUS | Status: DC | PRN
  Administered 2014-01-25: 4 mg via INTRAVENOUS
  Administered 2014-01-25: 2 mg via INTRAVENOUS
  Administered 2014-01-25: 4 mg via INTRAVENOUS

## 2014-01-25 MED ORDER — PANTOPRAZOLE SODIUM 40 MG IV SOLR
40.0000 mg | Freq: Every day | INTRAVENOUS | Status: DC
  Administered 2014-01-25 – 2014-01-26 (×2): 40 mg via INTRAVENOUS
  Filled 2014-01-25: qty 40

## 2014-01-25 MED ORDER — AMITRIPTYLINE HCL 100 MG PO TABS
100.0000 mg | ORAL_TABLET | Freq: Every day | ORAL | Status: DC
  Filled 2014-01-25: qty 1

## 2014-01-25 MED ORDER — SODIUM CHLORIDE 0.9 % IV SOLN
INTRAVENOUS | Status: DC
  Administered 2014-01-25: 03:00:00 via INTRAVENOUS
  Administered 2014-01-25: 150 mL via INTRAVENOUS
  Administered 2014-01-26 – 2014-01-28 (×2): via INTRAVENOUS

## 2014-01-25 MED ORDER — TOPIRAMATE 100 MG PO TABS: 100.0000 mg | ORAL_TABLET | Freq: Every day | ORAL | Status: DC

## 2014-01-25 MED ORDER — CARVEDILOL 12.5 MG PO TABS
12.5000 mg | ORAL_TABLET | Freq: Two times a day (BID) | ORAL | Status: DC
  Administered 2014-01-25 – 2014-01-27 (×2): 12.5 mg via ORAL
  Filled 2014-01-25 (×7): qty 1

## 2014-01-25 MED ORDER — PROPOFOL 10 MG/ML IV BOLUS
INTRAVENOUS | Status: DC | PRN
  Administered 2014-01-25: 50 mg via INTRAVENOUS

## 2014-01-25 MED ORDER — BIOTENE DRY MOUTH MT LIQD
15.0000 mL | Freq: Four times a day (QID) | OROMUCOSAL | Status: DC
  Administered 2014-01-25 (×2): 15 mL via OROMUCOSAL

## 2014-01-25 MED ORDER — GABAPENTIN 600 MG PO TABS
600.0000 mg | ORAL_TABLET | Freq: Three times a day (TID) | ORAL | Status: DC
  Filled 2014-01-25 (×3): qty 1

## 2014-01-25 MED ORDER — ARTIFICIAL TEARS OP OINT
TOPICAL_OINTMENT | OPHTHALMIC | Status: AC
  Filled 2014-01-25: qty 3.5

## 2014-01-25 MED ORDER — TOPIRAMATE 25 MG PO TABS
125.0000 mg | ORAL_TABLET | Freq: Every day | ORAL | Status: DC
  Filled 2014-01-25: qty 1

## 2014-01-25 MED ORDER — SODIUM BICARBONATE 8.4 % IV SOLN
INTRAVENOUS | Status: DC | PRN
  Administered 2014-01-25 (×2): 50 meq via INTRAVENOUS

## 2014-01-25 MED ORDER — FENTANYL CITRATE 0.05 MG/ML IJ SOLN
INTRAMUSCULAR | Status: AC
  Filled 2014-01-25: qty 5

## 2014-01-25 MED ORDER — ONDANSETRON HCL 4 MG/2ML IJ SOLN: 4.0000 mg | Freq: Four times a day (QID) | INTRAMUSCULAR | Status: DC | PRN

## 2014-01-25 MED ORDER — FLEET ENEMA 7-19 GM/118ML RE ENEM
1.0000 | ENEMA | Freq: Once | RECTAL | Status: AC | PRN
  Filled 2014-01-25: qty 1

## 2014-01-25 MED ORDER — DIPHENHYDRAMINE HCL 12.5 MG/5ML PO ELIX
12.5000 mg | ORAL_SOLUTION | Freq: Four times a day (QID) | ORAL | Status: DC | PRN
  Filled 2014-01-25: qty 5

## 2014-01-25 MED ORDER — HYDROMORPHONE HCL PF 1 MG/ML IJ SOLN
INTRAMUSCULAR | Status: AC
  Filled 2014-01-25: qty 1

## 2014-01-25 MED ORDER — ONDANSETRON HCL 4 MG/2ML IJ SOLN
INTRAMUSCULAR | Status: DC | PRN
  Administered 2014-01-25: 4 mg via INTRAVENOUS

## 2014-01-25 MED ORDER — HYDROMORPHONE 0.3 MG/ML IV SOLN: INTRAVENOUS | Status: DC

## 2014-01-25 MED ORDER — HYDROMORPHONE HCL PF 1 MG/ML IJ SOLN: 0.2500 mg | INTRAMUSCULAR | Status: DC | PRN

## 2014-01-25 MED ORDER — BIOTENE DRY MOUTH MT LIQD
15.0000 mL | Freq: Two times a day (BID) | OROMUCOSAL | Status: DC
  Administered 2014-01-25 – 2014-01-28 (×6): 15 mL via OROMUCOSAL

## 2014-01-25 MED ORDER — SODIUM CHLORIDE 0.9 % IR SOLN
Status: DC | PRN
  Administered 2014-01-25: 02:00:00

## 2014-01-25 MED ORDER — SODIUM CHLORIDE 0.9 % IV SOLN
INTRAVENOUS | Status: DC
  Administered 2014-01-25: 8.6 [IU]/h via INTRAVENOUS
  Filled 2014-01-25: qty 1

## 2014-01-25 MED ORDER — PROPOFOL 10 MG/ML IV EMUL
INTRAVENOUS | Status: AC
  Filled 2014-01-25: qty 100

## 2014-01-25 MED ORDER — SODIUM CHLORIDE 0.9 % IJ SOLN
3.0000 mL | Freq: Two times a day (BID) | INTRAMUSCULAR | Status: DC
  Administered 2014-01-25 – 2014-01-29 (×9): 3 mL via INTRAVENOUS

## 2014-01-25 MED ORDER — NOREPINEPHRINE BITARTRATE 1 MG/ML IV SOLN
2.0000 ug/min | INTRAVENOUS | Status: DC
  Filled 2014-01-25: qty 4

## 2014-01-25 MED ORDER — SODIUM CHLORIDE 0.9 % IJ SOLN
3.0000 mL | INTRAMUSCULAR | Status: DC | PRN
  Administered 2014-01-25: 3 mL via INTRAVENOUS

## 2014-01-25 MED ORDER — SENNA 8.6 MG PO TABS
1.0000 | ORAL_TABLET | Freq: Two times a day (BID) | ORAL | Status: DC
  Administered 2014-01-25 – 2014-02-02 (×11): 8.6 mg via ORAL
  Filled 2014-01-25 (×19): qty 1

## 2014-01-25 MED ORDER — ARTIFICIAL TEARS OP OINT
TOPICAL_OINTMENT | OPHTHALMIC | Status: DC | PRN
  Administered 2014-01-25: 1 via OPHTHALMIC

## 2014-01-25 MED ORDER — FENTANYL CITRATE 0.05 MG/ML IJ SOLN
50.0000 ug | INTRAMUSCULAR | Status: DC | PRN
  Administered 2014-01-25 (×2): 50 ug via INTRAVENOUS
  Filled 2014-01-25 (×2): qty 2

## 2014-01-25 MED ORDER — SODIUM CHLORIDE 0.9 % IV SOLN
INTRAVENOUS | Status: DC | PRN
  Administered 2014-01-25 (×2): via INTRAVENOUS

## 2014-01-25 MED ORDER — NITROGLYCERIN 0.4 MG SL SUBL: 0.4000 mg | SUBLINGUAL_TABLET | SUBLINGUAL | Status: DC | PRN

## 2014-01-25 MED ORDER — MIDAZOLAM HCL 2 MG/2ML IJ SOLN
INTRAMUSCULAR | Status: AC
  Filled 2014-01-25: qty 2

## 2014-01-25 MED ORDER — VANCOMYCIN HCL 1000 MG IV SOLR
INTRAVENOUS | Status: AC
  Filled 2014-01-25: qty 1000

## 2014-01-25 MED ORDER — CALCIUM CHLORIDE 10 % IV SOLN
INTRAVENOUS | Status: DC | PRN
  Administered 2014-01-25 (×2): 200 mg via INTRAVENOUS
  Administered 2014-01-25 (×2): 300 mg via INTRAVENOUS

## 2014-01-25 MED ORDER — MENTHOL 3 MG MT LOZG
1.0000 | LOZENGE | OROMUCOSAL | Status: DC | PRN
  Administered 2014-01-25 – 2014-01-26 (×2): 3 mg via ORAL
  Filled 2014-01-25 (×2): qty 9

## 2014-01-25 MED ORDER — ALUM & MAG HYDROXIDE-SIMETH 200-200-20 MG/5ML PO SUSP: 30.0000 mL | Freq: Four times a day (QID) | ORAL | Status: DC | PRN

## 2014-01-25 MED ORDER — ONDANSETRON HCL 4 MG/2ML IJ SOLN: 4.0000 mg | Freq: Once | INTRAMUSCULAR | Status: DC | PRN

## 2014-01-25 MED ORDER — ONDANSETRON HCL 4 MG/2ML IJ SOLN
INTRAMUSCULAR | Status: AC
  Filled 2014-01-25: qty 2

## 2014-01-25 MED ORDER — ATORVASTATIN CALCIUM 80 MG PO TABS
80.0000 mg | ORAL_TABLET | Freq: Every day | ORAL | Status: DC
  Filled 2014-01-25: qty 1

## 2014-01-25 MED ORDER — CHLORHEXIDINE GLUCONATE 0.12 % MT SOLN
15.0000 mL | Freq: Two times a day (BID) | OROMUCOSAL | Status: DC
  Administered 2014-01-25: 15 mL via OROMUCOSAL
  Filled 2014-01-25: qty 15

## 2014-01-25 MED ORDER — LACTATED RINGERS IV SOLN
INTRAVENOUS | Status: DC | PRN
Start: 2014-01-25 — End: 2014-01-25
  Administered 2014-01-25: 03:00:00 via INTRAVENOUS

## 2014-01-25 MED ORDER — SODIUM CHLORIDE 0.9 % IV BOLUS (SEPSIS)
1000.0000 mL | Freq: Once | INTRAVENOUS | Status: AC
  Administered 2014-01-25: 1000 mL via INTRAVENOUS

## 2014-01-25 MED ORDER — VANCOMYCIN HCL 1000 MG IV SOLR
INTRAVENOUS | Status: DC | PRN
  Administered 2014-01-25: 1000 mg

## 2014-01-25 MED ORDER — OXYCODONE HCL 5 MG PO TABS: 5.0000 mg | ORAL_TABLET | Freq: Once | ORAL | Status: DC | PRN

## 2014-01-25 MED ORDER — INSULIN ASPART 100 UNIT/ML ~~LOC~~ SOLN
0.0000 [IU] | SUBCUTANEOUS | Status: DC
  Administered 2014-01-25 – 2014-01-26 (×8): 4 [IU] via SUBCUTANEOUS
  Administered 2014-01-26: 15 [IU] via SUBCUTANEOUS
  Administered 2014-01-26 – 2014-01-27 (×3): 7 [IU] via SUBCUTANEOUS
  Administered 2014-01-27: 3 [IU] via SUBCUTANEOUS
  Administered 2014-01-27: 4 [IU] via SUBCUTANEOUS
  Administered 2014-01-28: 7 [IU] via SUBCUTANEOUS
  Administered 2014-01-28 (×2): 4 [IU] via SUBCUTANEOUS
  Administered 2014-01-28 (×2): 7 [IU] via SUBCUTANEOUS
  Administered 2014-01-29: 4 [IU] via SUBCUTANEOUS
  Administered 2014-01-29: 3 [IU] via SUBCUTANEOUS
  Administered 2014-01-29: 4 [IU] via SUBCUTANEOUS
  Administered 2014-01-29 (×2): 3 [IU] via SUBCUTANEOUS
  Administered 2014-01-29: 4 [IU] via SUBCUTANEOUS
  Administered 2014-01-30: 7 [IU] via SUBCUTANEOUS
  Administered 2014-01-30 (×2): 4 [IU] via SUBCUTANEOUS
  Administered 2014-01-30 – 2014-01-31 (×3): 3 [IU] via SUBCUTANEOUS
  Administered 2014-01-31: 4 [IU] via SUBCUTANEOUS
  Administered 2014-01-31: 7 [IU] via SUBCUTANEOUS
  Administered 2014-01-31: 3 [IU] via SUBCUTANEOUS
  Administered 2014-01-31: 7 [IU] via SUBCUTANEOUS

## 2014-01-25 MED ORDER — GABAPENTIN 600 MG PO TABS
600.0000 mg | ORAL_TABLET | Freq: Three times a day (TID) | ORAL | Status: DC
  Administered 2014-01-25 – 2014-01-27 (×6): 600 mg
  Filled 2014-01-25 (×6): qty 1

## 2014-01-25 MED ORDER — LIDOCAINE HCL (CARDIAC) 20 MG/ML IV SOLN
INTRAVENOUS | Status: DC | PRN
  Administered 2014-01-25: 100 mg via INTRAVENOUS

## 2014-01-25 MED ORDER — NOREPINEPHRINE BITARTRATE 1 MG/ML IV SOLN
4000.0000 ug | INTRAVENOUS | Status: DC | PRN
  Administered 2014-01-25: 4 ug/min via INTRAVENOUS

## 2014-01-25 MED ORDER — VANCOMYCIN HCL IN DEXTROSE 1-5 GM/200ML-% IV SOLN: 1000.0000 mg | Freq: Once | INTRAVENOUS | Status: DC

## 2014-01-25 MED ORDER — MIDAZOLAM HCL 5 MG/5ML IJ SOLN
INTRAMUSCULAR | Status: DC | PRN
  Administered 2014-01-25: 2 mg via INTRAVENOUS

## 2014-01-25 SURGICAL SUPPLY — 49 items
BAG DECANTER FOR FLEXI CONT (MISCELLANEOUS) ×2 IMPLANT
BENZOIN TINCTURE PRP APPL 2/3 (GAUZE/BANDAGES/DRESSINGS) ×2 IMPLANT
BLADE 10 SAFETY STRL DISP (BLADE) ×2 IMPLANT
BLADE SURG ROTATE 9660 (MISCELLANEOUS) IMPLANT
BRUSH SCRUB EZ PLAIN DRY (MISCELLANEOUS) ×2 IMPLANT
CANISTER SUCT 3000ML (MISCELLANEOUS) ×2 IMPLANT
CONT SPEC 4OZ CLIKSEAL STRL BL (MISCELLANEOUS) ×2 IMPLANT
DERMABOND ADVANCED (GAUZE/BANDAGES/DRESSINGS) ×1
DERMABOND ADVANCED .7 DNX12 (GAUZE/BANDAGES/DRESSINGS) ×1 IMPLANT
DRAPE LAPAROTOMY 100X72X124 (DRAPES) ×2 IMPLANT
DRAPE POUCH INSTRU U-SHP 10X18 (DRAPES) ×2 IMPLANT
DRAPE SURG 17X23 STRL (DRAPES) IMPLANT
ELECT REM PT RETURN 9FT ADLT (ELECTROSURGICAL) ×2
ELECTRODE REM PT RTRN 9FT ADLT (ELECTROSURGICAL) ×1 IMPLANT
EVACUATOR 1/8 PVC DRAIN (DRAIN) IMPLANT
GAUZE SPONGE 4X4 16PLY XRAY LF (GAUZE/BANDAGES/DRESSINGS) IMPLANT
GLOVE BIO SURGEON STRL SZ7 (GLOVE) ×2 IMPLANT
GLOVE BIO SURGEON STRL SZ7.5 (GLOVE) ×2 IMPLANT
GLOVE ECLIPSE 9.0 STRL (GLOVE) ×2 IMPLANT
GLOVE EXAM NITRILE LRG STRL (GLOVE) IMPLANT
GLOVE EXAM NITRILE MD LF STRL (GLOVE) IMPLANT
GLOVE EXAM NITRILE XL STR (GLOVE) IMPLANT
GLOVE EXAM NITRILE XS STR PU (GLOVE) IMPLANT
GLOVE INDICATOR 7.5 STRL GRN (GLOVE) ×2 IMPLANT
GOWN STRL REUS W/ TWL LRG LVL3 (GOWN DISPOSABLE) IMPLANT
GOWN STRL REUS W/ TWL XL LVL3 (GOWN DISPOSABLE) ×1 IMPLANT
GOWN STRL REUS W/TWL 2XL LVL3 (GOWN DISPOSABLE) IMPLANT
GOWN STRL REUS W/TWL LRG LVL3 (GOWN DISPOSABLE)
GOWN STRL REUS W/TWL XL LVL3 (GOWN DISPOSABLE) ×1
KIT BASIN OR (CUSTOM PROCEDURE TRAY) ×2 IMPLANT
KIT ROOM TURNOVER OR (KITS) ×2 IMPLANT
NEEDLE HYPO 25X1 1.5 SAFETY (NEEDLE) IMPLANT
NS IRRIG 1000ML POUR BTL (IV SOLUTION) ×2 IMPLANT
PACK LAMINECTOMY NEURO (CUSTOM PROCEDURE TRAY) ×2 IMPLANT
SPONGE GAUZE 4X4 12PLY (GAUZE/BANDAGES/DRESSINGS) ×2 IMPLANT
SPONGE SURGIFOAM ABS GEL SZ50 (HEMOSTASIS) ×2 IMPLANT
STRIP CLOSURE SKIN 1/2X4 (GAUZE/BANDAGES/DRESSINGS) ×2 IMPLANT
SUT ETHILON 2 0 FS 18 (SUTURE) ×4 IMPLANT
SUT PROLENE 0 CT 1 30 (SUTURE) ×2 IMPLANT
SUT VIC AB 0 CT1 18XCR BRD8 (SUTURE) ×1 IMPLANT
SUT VIC AB 0 CT1 8-18 (SUTURE) ×1
SUT VIC AB 2-0 CT1 18 (SUTURE) ×2 IMPLANT
SWAB CULTURE LIQ STUART DBL (MISCELLANEOUS) ×2 IMPLANT
SYR 20ML ECCENTRIC (SYRINGE) ×2 IMPLANT
TAPE CLOTH SURG 4X10 WHT LF (GAUZE/BANDAGES/DRESSINGS) ×2 IMPLANT
TOWEL OR 17X24 6PK STRL BLUE (TOWEL DISPOSABLE) ×2 IMPLANT
TOWEL OR 17X26 10 PK STRL BLUE (TOWEL DISPOSABLE) ×2 IMPLANT
TUBE ANAEROBIC SPECIMEN COL (MISCELLANEOUS) ×2 IMPLANT
WATER STERILE IRR 1000ML POUR (IV SOLUTION) ×2 IMPLANT

## 2014-01-25 NOTE — Progress Notes (Signed)
Patient extubated per MD's order placed on [facility] no stridor ausculted patient able to vocalize SATS 100% on 2LNC, BBS clear, no distress noticed.

## 2014-01-25 NOTE — Anesthesia Preprocedure Evaluation (Signed)
Anesthesia Evaluation  Patient identified by MRN, date of birth, ID band Patient awake    Reviewed: Allergy & Precautions, H&P , NPO status , Patient's Chart, lab work & pertinent test results  History of Anesthesia Complications Negative for: history of anesthetic complications  Airway Mallampati: III TM Distance: >3 FB Neck ROM: Full    Dental  (+) Teeth Intact, Partial Lower   Pulmonary shortness of breath and at rest,  breath sounds clear to auscultation        Cardiovascular hypertension, Pt. on medications + CAD and + Past MI - Cardiac Stents, - CABG and - CHF Rhythm:Regular Rate:Tachycardia     Neuro/Psych  Headaches, S/p L5-S1 fusion with wound infection  Neuromuscular disease negative psych ROS   GI/Hepatic Neg liver ROS, GERD-  Controlled,  Endo/Other  diabetes, Poorly Controlled, Type 2, Insulin Dependent  Renal/GU negative Renal ROS     Musculoskeletal   Abdominal   Peds  Hematology   Anesthesia Other Findings   Reproductive/Obstetrics                           Anesthesia Physical Anesthesia Plan  ASA: III and emergent  Anesthesia Plan: General   Post-op Pain Management:    Induction: Intravenous  Airway Management Planned: Oral ETT  Additional Equipment: None  Intra-op Plan:   Post-operative Plan: Extubation in OR  Informed Consent: I have reviewed the patients History and Physical, chart, labs and discussed the procedure including the risks, benefits and alternatives for the proposed anesthesia with the patient or authorized representative who has indicated his/her understanding and acceptance.   Dental advisory given  Plan Discussed with: CRNA and Surgeon  Anesthesia Plan Comments:         Anesthesia Quick Evaluation

## 2014-01-25 NOTE — Progress Notes (Signed)
CRITICAL VALUE ALERT  Critical value received:  Gram stain with few gram positive cocci in pairs and gram negative rods  Date of notification:  01/25/14  Time of notification:  0304  Critical value read back:yes   Nurse who received alert:  Denyse Amass   MD notified (1st page):  Dr. Earnie Larsson  Time of first page:  0307  MD notified (2nd page):  Time of second page:  Responding MD:  Dr. Earnie Larsson  Time MD responded:  [phone]

## 2014-01-25 NOTE — Anesthesia Procedure Notes (Addendum)
Procedure Name: Intubation Date/Time: 01/25/2014 1:02 AM Performed by: Vaughan Browner Pre-anesthesia Checklist: Patient identified, Emergency Drugs available, Suction available and Patient being monitored Patient Re-evaluated:Patient Re-evaluated prior to inductionOxygen Delivery Method: Circle system utilized Preoxygenation: Pre-oxygenation with 100% oxygen Intubation Type: IV induction, Rapid sequence and Cricoid Pressure applied Laryngoscope Size: Mac and 3 Grade View: Grade I Tube type: Oral Tube size: 7.5 mm Number of attempts: 1 Airway Equipment and Method: Stylet Placement Confirmation: ETT inserted through vocal cords under direct vision,  positive ETCO2 and breath sounds checked- equal and bilateral Secured at: 23 cm Tube secured with: Tape Dental Injury: Teeth and Oropharynx as per pre-operative assessment

## 2014-01-25 NOTE — Consult Note (Signed)
PULMONARY  / CRITICAL CARE MEDICINE  Name: Marissa [name] MRN: [id number] DOB: May 23, 1976    ADMISSION DATE:  01/24/2014 CONSULTATION DATE:  01/25/2014  REFERRING MD :  Earnie Larsson, MD PRIMARY SERVICE: Neurosurgery  CHIEF COMPLAINT:  Back pain  BRIEF PATIENT DESCRIPTION:  38 yo female with recent admission for lumbar disc herniation s/p redo decompressive lunbar laminectomy presenting with severe back pain and found to have lumbar wound infection. Patient now s/p lumbar wound irrigation and debridement 01/25/2014 and we are consulted for management of mechanical ventilation.  SIGNIFICANT EVENTS / STUDIES:  01/16/2014 - redo decompressive lumbar laminectomy L5-S1 and posterior lumbar interbody fusion 01/21/2014 - hospital discharge 01/24/2014 - admission for lumbar wound infection 01/25/2014 - re-exploration of lumbar wound with irrigation and debridement  LINES / TUBES: L IJ CVL 01/25/2014 >>> L arterial line 01/25/2014 >>> ETT 01/25/2014 >>> Foley catheter 01/25/2014 >>> PIV  CULTURES: Wound culture 01/25/2014 >>> gram stain with abundant WBC, GPC in pairs, rare GNR Blood culture x 2 01/25/2014 >>> pending Urine culture 01/25/2014 >>> pending  ANTIBIOTICS: Vancomycin 01/25/2014 >>> Zosyn 01/25/2014 >>>  HISTORY OF PRESENT ILLNESS:   38 yo female with HTN, CAD, DM, h/o multiple previous back surgeries presenting with back pain. Patient had recent admission 6/5 - 01/21/2014 for redo decompressive lumbar laminectomy L5-S1 and posterior lumbar interbody fusion. After hospital discharge, she developed fever, generalized weakness, severe lumbar pain radiating into BLE and wound drainage. Due to concern for wound infection, she was taken to OR for lumbar wound irrigation and debridement. We are now consulted for evaluation and management of mechanical ventilation.  PAST MEDICAL HISTORY :  Past Medical History  Diagnosis Date  . Hypertension   . Coronary artery disease   . Neuropathy   .  Headache(784.0)   . Arthritis   . Vision loss     due to diabetes  . Myocardial infarction 2006    "due to medication"; no evidence of ischemia or infarction by nuclear stress test '11  . Dysrhythmia     tachycardia  . Diabetes mellitus without complication     fasting cbg 50-140s  . GERD (gastroesophageal reflux disease)     otc meds  . Migraines     once/month maybe - can last up to two weeks  . Hyperlipemia    Past Surgical History  Procedure Laterality Date  . Back surgery  2011    Lumbar   . Cervical fusion  2006  . Salpingoophorectomy Bilateral     1 1997. 2nd 2001  . Cardiac catheterization  07/18/2004    50-60% mid LAD, minor luminal irregularities RCA, normal LM and CX, EF 50-55% [facility])  . Lumbar laminectomy/decompression microdiscectomy Right 05/07/2013    Procedure: Right Lumbar one-two laminectomy;  Surgeon: Elaina Hoops, MD;  Location: [place] [facility];  Service: Neurosurgery;  Laterality: Right;  . Carpel tunnel Bilateral   . Lumbar laminectomy/decompression microdiscectomy Left 10/29/2013    Procedure: Left Lumbar five-Sacral one Laminectomy;  Surgeon: Elaina Hoops, MD;  Location: [place] [facility];  Service: Neurosurgery;  Laterality: Left;   Prior to Admission medications   Medication Sig Start Date End Date Taking? Authorizing Provider  amitriptyline (ELAVIL) 100 MG tablet Take 100 mg by mouth at bedtime.    Yes Historical Provider, MD  atorvastatin (LIPITOR) 80 MG tablet Take 80 mg by mouth at bedtime.    Yes Historical Provider, MD  carvedilol (COREG) 6.25 MG tablet Take 12.5 mg by mouth 2 (two)  times daily with a meal.    Yes Historical Provider, MD  gabapentin (NEURONTIN) 300 MG capsule Take 600 mg by mouth 3 (three) times daily.    Yes Historical Provider, MD  insulin aspart (NOVOLOG FLEXPEN) 100 UNIT/ML SOPN FlexPen Inject 10-20 Units into the skin 3 (three) times daily with meals. Sliding scale   Yes Historical Provider, MD  Liraglutide (VICTOZA)  18 MG/3ML SOPN Inject 1.8 mg into the skin daily.   Yes Historical Provider, MD  nitroGLYCERIN (NITROSTAT) 0.4 MG SL tablet Place 0.4 mg under the tongue every 5 (five) minutes as needed for chest pain.   Yes Historical Provider, MD  oxycodone (ROXICODONE) 30 MG immediate release tablet Take 1 tablet (30 mg total) by mouth every 6 (six) hours as needed for pain. 01/21/14  Yes Eustace Moore, MD  SUMAtriptan (IMITREX) 50 MG tablet Take 50 mg by mouth every 2 (two) hours as needed for migraine or headache. May repeat in 2 hours if headache persists or recurs.   Yes Historical Provider, MD  tiZANidine (ZANAFLEX) 4 MG tablet Take 1 tablet (4 mg total) by mouth 3 (three) times daily. 01/21/14  Yes Eustace Moore, MD  topiramate (TOPAMAX) 100 MG tablet Take 100 mg by mouth at bedtime. Take with 25 mg tablet for a 125 mg dose   Yes Historical Provider, MD  topiramate (TOPAMAX) 25 MG tablet Take 25 mg by mouth at bedtime. Take with 100 mg tablet for a 125 mg dose   Yes Historical Provider, MD   Allergies  Allergen Reactions  . Ciprofloxacin Swelling  . Azithromycin Itching and Rash  . Food Itching and Rash    "[place] yogurt"    FAMILY HISTORY:  History reviewed. No pertinent family history. SOCIAL HISTORY:  reports that she has never smoked. She does not have any smokeless tobacco history on file. She reports that she does not drink alcohol or use illicit drugs.  REVIEW OF SYSTEMS:  Unable to obtain secondary to patient intubated and sedated  VITAL SIGNS: Temp:  [98.8 F (37.1 C)-102.8 F (39.3 C)] 100.2 F (37.9 C) (06/14 0359) Pulse Rate:  [98-124] 110 (06/14 0500) Resp:  [11-33] 19 (06/14 0500) BP: (87-150)/(45-91) 121/75 mmHg (06/14 0500) SpO2:  [93 %-100 %] 100 % (06/14 0500) Arterial Line BP: (167)/(82) 167/82 mmHg (06/14 0500) FiO2 (%):  [40 %-60 %] 40 % (06/14 0459) Weight:  [259 lb 14.8 oz (117.9 kg)] 259 lb 14.8 oz (117.9 kg) (06/14 0500) HEMODYNAMICS:   VENTILATOR SETTINGS: Vent  Mode:  [-] PRVC FiO2 (%):  [40 %-60 %] 40 % Set Rate:  [14 bmp] 14 bmp Vt Set:  [450 mL] 450 mL PEEP:  [5 cmH20] 5 cmH20 Plateau Pressure:  [22 cmH20] 22 cmH20 INTAKE / OUTPUT: Intake/Output     06/13 0701 - 06/14 0700   I.V. (mL/kg) 2600 (22.1)   Blood 670   Total Intake(mL/kg) 3270 (27.7)   Net +3270         PHYSICAL EXAMINATION: General:  No acute distress, on mechanical ventilation Neuro:  Sedated HEENT:  AT, [place], ETT in place Cardiovascular:  RRR, no murmurs/rubs/gallops, no edema  Lungs:  Bilateral coarse ventilator breath sounds Abdomen:  +BS, soft, nondistended  Musculoskeletal:  No clubbing or cyanosis Skin:  No rash  LABS:  CBC Recent Labs     01/24/14  2111  01/25/14  0146  01/25/14  0229  01/25/14  0512  WBC  19.2*   --    --  18.3*  HGB  9.8*  7.1*  7.5*  9.6*  HCT  29.6*  21.0*  22.0*  29.7*  PLT  262   --    --   222   Coag's No results found for this basename: APTT, INR,  in the last 72 hours BMET Recent Labs     01/24/14  2111  01/25/14  0146  01/25/14  0229  NA  135*  140  140  K  4.4  3.2*  3.1*  CL  98   --    --   CO2  20   --    --   BUN  10   --    --   CREATININE  0.91   --    --   GLUCOSE  508*   --    --    Electrolytes Recent Labs     01/24/14  2111  CALCIUM  8.6   Sepsis Markers No results found for this basename: LACTICACIDVEN, PROCALCITON, O2SATVEN,  in the last 72 hours ABG Recent Labs     01/25/14  0146  01/25/14  0229  01/25/14  0400  PHART  7.255*  7.226*  7.290*  PCO2ART  34.9*  50.9*  42.0  PO2ART  370.0*  89.0  197.0*   Liver Enzymes No results found for this basename: AST, ALT, ALKPHOS, BILITOT, ALBUMIN,  in the last 72 hours Cardiac Enzymes No results found for this basename: TROPONINI, PROBNP,  in the last 72 hours Glucose Recent Labs     01/25/14  0319  GLUCAP  305*    Imaging Dg Chest 2 View  01/24/2014   CLINICAL DATA:  Chest pain and weakness.  EXAM: CHEST  2 VIEW  COMPARISON:  One-view  chest 01/18/2014.  FINDINGS: Low lung volumes exaggerate the heart size. Patchy low lower airspace disease is present bilaterally, worse on the left. Moderate pulmonary vascular congestion is present. The visualized soft tissues and bony thorax are unremarkable.  IMPRESSION: 1. Patchy lower lobe airspace disease bilaterally, left greater than right. While this may represent atelectasis, infection is not excluded. 2. Low lung volumes. 3. Mild pulmonary vascular congestion.   Electronically Signed   By: Lawrence Santiago M.D.   On: 01/24/2014 21:59   Dg Chest Portable 1 View  01/25/2014   CLINICAL DATA:  Sepsis.  EXAM: PORTABLE CHEST - 1 VIEW  COMPARISON:  Chest radiograph performed 01/24/2014  FINDINGS: The patient's endotracheal tube is seen ending 1-2 cm above the carina. This could be retracted by approximately 1 cm, as deemed clinically appropriate.  A left IJ line is seen likely extending into an intercostal vessel; would retract this by approximately 2 cm.  The lungs are hypoexpanded. Patchy bilateral airspace opacification may reflect atelectasis or pneumonia. No definite pleural effusion or pneumothorax is seen.  The cardiomediastinal silhouette is borderline normal in size. No acute osseous abnormalities are identified. Cervical spinal fusion hardware is partially imaged.  IMPRESSION: 1. Endotracheal tube seen ending 1-2 cm above the carina. This could be retracted by approximately 1 cm, as deemed clinically appropriate. 2. Left IJ line noted likely extending into an intercostal vessel; would retract this by approximately 2 cm. 3. Lungs hypoexpanded. Patchy bilateral airspace opacification may reflect atelectasis or pneumonia.  These results were called by telephone at the time of interpretation on 01/25/2014 at 4:26 AM to [facility] RN, who verbally acknowledged these results.   Electronically Signed   By: Francoise Schaumann.D.  On: 01/25/2014 04:27    ASSESSMENT / PLAN:  PULMONARY A: Hypoxic  respiratory failure on mechanical ventilation post-procedure P:   - Monitor ABG and wean settings as able - Daily SBT if meets criteria - VAP precautions - Sedation/analgesia with PRN fentanyl - Duonebs PRN  CARDIOVASCULAR A: Hypertension P:  - Continue home carvedilol  RENAL A:  No acute issues, continue to monitor closely  GASTROINTESTINAL A:  GERD GI prophylaxis while intubated P:   - Pantoprazole  HEMATOLOGIC A:  Leukocytosis DVT prophylaxis P:  - Leukocytosis likely related to infection, continue to monitor - Holdng anticoagulation for now in setting of recent surgery  INFECTIOUS A:  Lumbar wound infection P:   - Gram stain with GPCs, GNRs - Continue vancomycin and zosyn and tailor pending culture data - Blood and urine cultures pending  ENDOCRINE A:  Diabetes mellitus   P:   - insulin gtt with SSI insulin  NEUROLOGIC A:  Sedation and analgesia P:   - Propofol gtt and PRN fentanyl  I have personally obtained a history, examined the patient, evaluated laboratory and imaging results, formulated the assessment and plan and placed orders.   Maricela Curet, MD, PhD Pulmonary and [place] Pager: [phone]  01/25/2014, 5:30 AM

## 2014-01-25 NOTE — Progress Notes (Signed)
PT Cancellation Note  Patient Details Name: Emmaline Wahba MRN: [id number] DOB: 1976/05/01   Cancelled Treatment:    Reason Eval/Treat Not Completed: Other (comment) (Back brace at home and family to bring in am)   INGOLD,Binnie Vonderhaar 01/25/2014, 2:10 PM [facility] [phone] (pager)

## 2014-01-25 NOTE — Progress Notes (Signed)
Neurosurgeon on call, Dr. Hal Neer, contacted concerning suspicious clear fluid leaking from back onto bottom chuck of bed. Instructed to remove dressing and reapply a pressure dressing and monitor. Neurosurgery to reassess in am. Will continue to monitor.

## 2014-01-25 NOTE — Op Note (Signed)
Date of procedure: 01/25/2014  Date of dictation: Same  Service: Neurosurgery  Preoperative diagnosis: Postoperative lumbar wound infection  Postoperative diagnosis: Same  Procedure Name: Reexploration of lumbar wound with irrigation and debridement.  Surgeon:Sharron Simpson A.Torre Pikus, M.D.  Asst. Surgeon: None  Anesthesia: General  Indication: 38 year old female proximal 1 week status post lumbar decompression infusion presents now with severe back and bilateral lower trimming pain coinciding with fevers and marked wound drainage. Physical examination consistent with superficial and deep wound space infection. Patient taken to the operating room for irrigation debridement.  Operative note: After induction of anesthesia, patient positioned prone onto Wilson frame and appropriately padded. A bur region prepped and draped. Lumbar wound 3 opened. Pus under pressure was encountered. Cultures were obtained. Fashion was opened. The purulence continued down into the epidural space. The wound was then copiously irrigated. The wound edges were aggressively debrided. Vancomycin powder was placed in the deep wound space. A medium large Hemovac drain was left in the epidural space. Wound was closed with 2 Vicryl sutures in the fascia and then interrupted nylon sutures through the skin. No apparent complications. Patient returns to the recovery room in stable condition.

## 2014-01-25 NOTE — Progress Notes (Signed)
Patient extubated this morning without difficulty. She is awake and alert and requesting food. She complains of back pain. She denies any new lower extremity pain or weakness.  Febrile to 102.8. Remains tachycardic but maintaining good blood pressure. Urine output marginal. Patient awake and alert. She is oriented and appropriate. She appears mildly toxic. Motor and sensory function of the extremities are stable. Dressing currently dry. Drain output moderate.  Lumbar wound abscess with sepsis. Gram stain with both gram-positive cocci and gram-negative rods. Cultures pending. Continue vancomycin and Zosyn. We'll begin to mobilize.

## 2014-01-25 NOTE — Transfer of Care (Signed)
Immediate Anesthesia Transfer of Care Note  Patient: Marissa [name]  Procedure(s) Performed: Procedure(s): LUMBAR WOUND DEBRIDEMENT (N/A)  Patient Location: ICU  Anesthesia Type:General  Level of Consciousness: Patient remains intubated per anesthesia plan  Airway & Oxygen Therapy: Patient remains intubated per anesthesia plan and Patient placed on Ventilator (see vital sign flow sheet for setting)  Post-op Assessment: Report given to PACU RN and Post -op Vital signs reviewed and stable  Post vital signs: Reviewed and stable  Complications: No apparent anesthesia complications

## 2014-01-25 NOTE — Progress Notes (Signed)
CRITICAL VALUE ALERT  Critical value received:  Blood Culture Results Gram Positive Cocci in Chains  Date of notification:  01/25/14  Time of notification: 2014  Critical value read back: Yes  Nurse who received alert:  Denyse Amass  MD notified (1st page):  Annia Friendly  Time of first page:  2030  MD notified (2nd page):  Time of second page:  Responding MD:  Annia Friendly  Time MD responded:  2043

## 2014-01-25 NOTE — Progress Notes (Signed)
eLink Physician-Brief Progress Note Patient Name: Marissa [name] DOB: [birth year] MRN: [id number]  Date of Service  01/25/2014   HPI/Events of Note   Pt returns on vent post wound infection debridement s/p lumbar spine surgery  eICU Interventions  See orders, full pccm note to follow   Intervention Category Major Interventions: Respiratory failure - evaluation and management  Adisson Deak 01/25/2014, 4:07 AM

## 2014-01-25 NOTE — Progress Notes (Signed)
Patient became hypotensive; Pressures in the 42V systolic. Patient without complaints and neurologically intact.  Dr. Gar Gibbon contacted. Orders received to give a liter bolus of NS. Will continue to monitor.

## 2014-01-25 NOTE — Progress Notes (Signed)
PULMONARY  / CRITICAL CARE MEDICINE  Name: Marissa [name] MRN: [id number] DOB: 10-30-1975    ADMISSION DATE:  01/24/2014 CONSULTATION DATE:  01/25/2014  REFERRING MD :  Earnie Larsson, MD PRIMARY SERVICE: Neurosurgery  CHIEF COMPLAINT:  Back pain  BRIEF PATIENT DESCRIPTION:  38 yo female with recent admission for lumbar disc herniation s/p redo decompressive lunbar laminectomy presenting with severe back pain and found to have lumbar wound infection. Patient now s/p lumbar wound irrigation and debridement 01/25/2014 and we are consulted for management of mechanical ventilation.  SIGNIFICANT EVENTS / STUDIES:  01/16/2014 - redo decompressive lumbar laminectomy L5-S1 and posterior lumbar interbody fusion 01/21/2014 - hospital discharge 01/24/2014 - admission for lumbar wound infection 01/25/2014 - re-exploration of lumbar wound with irrigation and debridement  LINES / TUBES: L IJ CVL 01/25/2014 >>> L arterial line 01/25/2014 >>> ETT 01/25/2014 >>> Foley catheter 01/25/2014 >>> PIV  CULTURES: Wound culture 01/25/2014 >>> gram stain with abundant WBC, GPC in pairs, rare GNR Blood culture x 2 01/25/2014 >>> pending Urine culture 01/25/2014 >>> pending  ANTIBIOTICS: Vancomycin 01/25/2014 >>> Zosyn 01/25/2014 >>>  Subjective:  Tolerating PSV On an insulin gtt currently  VITAL SIGNS: Temp:  [98.2 F (36.8 C)-102.8 F (39.3 C)] 101.5 F (38.6 C) (06/14 0930) Pulse Rate:  [98-124] 110 (06/14 0930) Resp:  [11-33] 20 (06/14 0930) BP: (87-170)/(45-104) 140/84 mmHg (06/14 0930) SpO2:  [85 %-100 %] 100 % (06/14 0930) Arterial Line BP: (110-219)/(82-148) 115/89 mmHg (06/14 0930) FiO2 (%):  [30 %-60 %] 30 % (06/14 0910) Weight:  [117.9 kg (259 lb 14.8 oz)] 117.9 kg (259 lb 14.8 oz) (06/14 0500) HEMODYNAMICS:   VENTILATOR SETTINGS: Vent Mode:  [-] PSV FiO2 (%):  [30 %-60 %] 30 % Set Rate:  [14 bmp] 14 bmp Vt Set:  [450 mL] 450 mL PEEP:  [5 cmH20] 5 cmH20 Pressure Support:  [5 cmH20] 5  cmH20 Plateau Pressure:  [22 cmH20] 22 cmH20 INTAKE / OUTPUT: Intake/Output     06/13 0701 - 06/14 0700 06/14 0701 - 06/15 0700   I.V. (mL/kg) 3210.4 (27.2) 310.7 (2.6)   Blood 670    IV Piggyback 212.5 25   Total Intake(mL/kg) 4092.9 (34.7) 335.7 (2.8)   Urine (mL/kg/hr) 600 70 (0.2)   Total Output 600 70   Net +3492.9 +265.7          PHYSICAL EXAMINATION: General:  No acute distress, on mechanical ventilation Neuro:  Sedated HEENT:  AT, [place], ETT in place Cardiovascular:  RRR, no murmurs/rubs/gallops, no edema  Lungs:  Bilateral coarse ventilator breath sounds Abdomen:  +BS, soft, nondistended  Musculoskeletal:  No clubbing or cyanosis Skin:  No rash  LABS:  CBC Recent Labs     01/24/14  2111  01/25/14  0146  01/25/14  0229  01/25/14  0512  WBC  19.2*   --    --   18.3*  HGB  9.8*  7.1*  7.5*  9.6*  HCT  29.6*  21.0*  22.0*  29.7*  PLT  262   --    --   222   Coag's Recent Labs     01/25/14  0512  APTT  30  INR  1.38   BMET Recent Labs     01/24/14  2111  01/25/14  0146  01/25/14  0229  01/25/14  0512  NA  135*  140  140  141  K  4.4  3.2*  3.1*  3.9  CL  98   --    --  106  CO2  20   --    --   19  BUN  10   --    --   10  CREATININE  0.91   --    --   0.95  GLUCOSE  508*   --    --   208*   Electrolytes Recent Labs     01/24/14  2111  01/25/14  0512  CALCIUM  8.6  8.3*   Sepsis Markers No results found for this basename: LACTICACIDVEN, PROCALCITON, O2SATVEN,  in the last 72 hours ABG Recent Labs     01/25/14  0146  01/25/14  0229  01/25/14  0400  PHART  7.255*  7.226*  7.290*  PCO2ART  34.9*  50.9*  42.0  PO2ART  370.0*  89.0  197.0*   Liver Enzymes Recent Labs     01/25/14  0512  AST  15  ALT  12  ALKPHOS  79  BILITOT  1.0  ALBUMIN  2.0*   Cardiac Enzymes Recent Labs     01/25/14  0512  TROPONINI  <0.30   Glucose Recent Labs     01/25/14  0319  01/25/14  0411  01/25/14  0501  01/25/14  0601  01/25/14  0702   01/25/14  0756  GLUCAP  305*  230*  213*  175*  167*  136*    Imaging Dg Chest 2 View  01/24/2014   CLINICAL DATA:  Chest pain and weakness.  EXAM: CHEST  2 VIEW  COMPARISON:  One-view chest 01/18/2014.  FINDINGS: Low lung volumes exaggerate the heart size. Patchy low lower airspace disease is present bilaterally, worse on the left. Moderate pulmonary vascular congestion is present. The visualized soft tissues and bony thorax are unremarkable.  IMPRESSION: 1. Patchy lower lobe airspace disease bilaterally, left greater than right. While this may represent atelectasis, infection is not excluded. 2. Low lung volumes. 3. Mild pulmonary vascular congestion.   Electronically Signed   By: Lawrence Santiago M.D.   On: 01/24/2014 21:59   Dg Chest Portable 1 View  01/25/2014   CLINICAL DATA:  Sepsis.  EXAM: PORTABLE CHEST - 1 VIEW  COMPARISON:  Chest radiograph performed 01/24/2014  FINDINGS: The patient's endotracheal tube is seen ending 1-2 cm above the carina. This could be retracted by approximately 1 cm, as deemed clinically appropriate.  A left IJ line is seen likely extending into an intercostal vessel; would retract this by approximately 2 cm.  The lungs are hypoexpanded. Patchy bilateral airspace opacification may reflect atelectasis or pneumonia. No definite pleural effusion or pneumothorax is seen.  The cardiomediastinal silhouette is borderline normal in size. No acute osseous abnormalities are identified. Cervical spinal fusion hardware is partially imaged.  IMPRESSION: 1. Endotracheal tube seen ending 1-2 cm above the carina. This could be retracted by approximately 1 cm, as deemed clinically appropriate. 2. Left IJ line noted likely extending into an intercostal vessel; would retract this by approximately 2 cm. 3. Lungs hypoexpanded. Patchy bilateral airspace opacification may reflect atelectasis or pneumonia.  These results were called by telephone at the time of interpretation on 01/25/2014 at 4:26 AM  to [facility] RN, who verbally acknowledged these results.   Electronically Signed   By: Garald Balding M.D.   On: 01/25/2014 04:27    ASSESSMENT / PLAN:  PULMONARY A: Hypoxic respiratory failure on mechanical ventilation post-procedure P:   - SBT now and extubate if able - VAP precautions - Sedation/analgesia with PRN  fentanyl - Duonebs PRN  CARDIOVASCULAR A: Hypertension P:  - Continue home carvedilol  RENAL A:  No acute issues, continue to monitor closely  GASTROINTESTINAL A:  GERD GI prophylaxis while intubated P:   - Pantoprazole  HEMATOLOGIC A:  Leukocytosis DVT prophylaxis P:  - Leukocytosis likely related to infection, continue to monitor - Holdng anticoagulation for now in setting of recent surgery > SCD  INFECTIOUS A:  Lumbar wound infection P:   - Gram stain with GPCs, GNRs - Continue vancomycin and zosyn and tailor pending culture data - Blood and urine cultures pending  ENDOCRINE A:  Diabetes mellitus   P:   - insulin gtt with SSI insulin  NEUROLOGIC A:  Sedation and analgesia Chronic pain P:   - Propofol gtt and PRN fentanyl - goal restart neurontin, topomax, imitrex elavil once stabilized post-extubation  I have personally obtained a history, examined the patient, evaluated laboratory and imaging results, formulated the assessment and plan and placed orders.  45 minutes additional CC time  Baltazar Apo, MD, PhD 01/25/2014, 9:50 AM [facility] [phone] or if no answer [phone]

## 2014-01-25 NOTE — Brief Op Note (Signed)
01/24/2014 - 01/25/2014  2:22 AM  PATIENT:  Marissa [name]  38 y.o. female  PRE-OPERATIVE DIAGNOSIS:  lumbar wound infection  POST-OPERATIVE DIAGNOSIS:  lumbar wound infection  PROCEDURE:  Procedure(s): LUMBAR WOUND DEBRIDEMENT (N/A)  SURGEON:  Surgeon(s) and Role:    * Charlie Pitter, MD - Primary  PHYSICIAN ASSISTANT:   ASSISTANTS: none   ANESTHESIA:   general  EBL:  Total I/O In: 2000 [I.V.:2000] Out: -   BLOOD ADMINISTERED:none  DRAINS: (Large) Hemovact drain(s) in the Epidural space with  Suction Open   LOCAL MEDICATIONS USED:  NONE  SPECIMEN:  No Specimen  DISPOSITION OF SPECIMEN:  N/A  COUNTS:  YES  TOURNIQUET:  * No tourniquets in log *  DICTATION: .Dragon Dictation  PLAN OF CARE: Admit to inpatient   PATIENT DISPOSITION:  PACU - hemodynamically stable.   Delay start of Pharmacological VTE agent (>24hrs) due to surgical blood loss or risk of bleeding: yes

## 2014-01-26 ENCOUNTER — Inpatient Hospital Stay (HOSPITAL_COMMUNITY): Payer: Medicare HMO

## 2014-01-26 DIAGNOSIS — J9600 Acute respiratory failure, unspecified whether with hypoxia or hypercapnia: Secondary | ICD-10-CM | POA: Diagnosis not present

## 2014-01-26 DIAGNOSIS — M549 Dorsalgia, unspecified: Secondary | ICD-10-CM | POA: Diagnosis not present

## 2014-01-26 DIAGNOSIS — A419 Sepsis, unspecified organism: Secondary | ICD-10-CM | POA: Diagnosis not present

## 2014-01-26 DIAGNOSIS — T8140XA Infection following a procedure, unspecified, initial encounter: Secondary | ICD-10-CM | POA: Diagnosis not present

## 2014-01-26 LAB — URINE MICROSCOPIC-ADD ON

## 2014-01-26 LAB — BASIC METABOLIC PANEL
BUN: 19 mg/dL (ref 6–23)
CALCIUM: 7.1 mg/dL — AB (ref 8.4–10.5)
CO2: 17 mEq/L — ABNORMAL LOW (ref 19–32)
Chloride: 109 mEq/L (ref 96–112)
Creatinine, Ser: 2.83 mg/dL — ABNORMAL HIGH (ref 0.50–1.10)
GFR calc Af Amer: 23 mL/min — ABNORMAL LOW (ref >90)
GFR, EST NON AFRICAN AMERICAN: 20 mL/min — AB (ref >90)
Glucose, Bld: 197 mg/dL — ABNORMAL HIGH (ref 70–99)
Potassium: 3.9 mEq/L (ref 3.7–5.3)
SODIUM: 139 meq/L (ref 137–147)

## 2014-01-26 LAB — OSMOLALITY, URINE: Osmolality, Ur: 129 mOsm/kg — ABNORMAL LOW (ref 390–1090)

## 2014-01-26 LAB — URINALYSIS, ROUTINE W REFLEX MICROSCOPIC
BILIRUBIN URINE: NEGATIVE
Glucose, UA: NEGATIVE mg/dL
Ketones, ur: NEGATIVE mg/dL
NITRITE: NEGATIVE
PROTEIN: 30 mg/dL — AB
Specific Gravity, Urine: 1.012 (ref 1.005–1.030)
Urobilinogen, UA: 1 mg/dL (ref 0.0–1.0)
pH: 5.5 (ref 5.0–8.0)

## 2014-01-26 LAB — GLUCOSE, CAPILLARY
GLUCOSE-CAPILLARY: 182 mg/dL — AB (ref 70–99)
GLUCOSE-CAPILLARY: 166 mg/dL — AB (ref 70–99)
GLUCOSE-CAPILLARY: 219 mg/dL — AB (ref 70–99)
Glucose-Capillary: 166 mg/dL — ABNORMAL HIGH (ref 70–99)
Glucose-Capillary: 176 mg/dL — ABNORMAL HIGH (ref 70–99)
Glucose-Capillary: 340 mg/dL — ABNORMAL HIGH (ref 70–99)

## 2014-01-26 LAB — CBC WITH DIFFERENTIAL/PLATELET
Basophils Absolute: 0 10*3/uL (ref 0.0–0.1)
Basophils Relative: 0 % (ref 0–1)
EOS ABS: 0.3 10*3/uL (ref 0.0–0.7)
Eosinophils Relative: 2 % (ref 0–5)
HEMATOCRIT: 25.2 % — AB (ref 36.0–46.0)
Hemoglobin: 8.4 g/dL — ABNORMAL LOW (ref 12.0–15.0)
LYMPHS PCT: 9 % — AB (ref 12–46)
Lymphs Abs: 1.4 10*3/uL (ref 0.7–4.0)
MCH: 28.5 pg (ref 26.0–34.0)
MCHC: 33.3 g/dL (ref 30.0–36.0)
MCV: 85.4 fL (ref 78.0–100.0)
Monocytes Absolute: 0.6 10*3/uL (ref 0.1–1.0)
Monocytes Relative: 4 % (ref 3–12)
NEUTROS ABS: 13.2 10*3/uL — AB (ref 1.7–7.7)
Neutrophils Relative %: 85 % — ABNORMAL HIGH (ref 43–77)
PLATELETS: 170 10*3/uL (ref 150–400)
RBC: 2.95 MIL/uL — ABNORMAL LOW (ref 3.87–5.11)
RDW: 15 % (ref 11.5–15.5)
WBC: 15.5 10*3/uL — AB (ref 4.0–10.5)

## 2014-01-26 LAB — URIC ACID: Uric Acid, Serum: 4.9 mg/dL (ref 2.4–7.0)

## 2014-01-26 LAB — MAGNESIUM: MAGNESIUM: 1.5 mg/dL (ref 1.5–2.5)

## 2014-01-26 LAB — CBC
HCT: 25.8 % — ABNORMAL LOW (ref 36.0–46.0)
Hemoglobin: 8.4 g/dL — ABNORMAL LOW (ref 12.0–15.0)
MCH: 27.9 pg (ref 26.0–34.0)
MCHC: 32.6 g/dL (ref 30.0–36.0)
MCV: 85.7 fL (ref 78.0–100.0)
PLATELETS: 167 10*3/uL (ref 150–400)
RBC: 3.01 MIL/uL — ABNORMAL LOW (ref 3.87–5.11)
RDW: 14.9 % (ref 11.5–15.5)
WBC: 15.3 10*3/uL — ABNORMAL HIGH (ref 4.0–10.5)

## 2014-01-26 LAB — VANCOMYCIN, TROUGH: Vancomycin Tr: 55.5 ug/mL (ref 10.0–20.0)

## 2014-01-26 LAB — PHOSPHORUS: PHOSPHORUS: 4 mg/dL (ref 2.3–4.6)

## 2014-01-26 LAB — SODIUM, URINE, RANDOM: Sodium, Ur: 21 mEq/L

## 2014-01-26 MED ORDER — PIPERACILLIN-TAZOBACTAM IN DEX 2-0.25 GM/50ML IV SOLN
2.2500 g | Freq: Four times a day (QID) | INTRAVENOUS | Status: DC
  Administered 2014-01-26 – 2014-01-29 (×10): 2.25 g via INTRAVENOUS
  Filled 2014-01-26 (×12): qty 50

## 2014-01-26 MED ORDER — FENTANYL CITRATE 0.05 MG/ML IJ SOLN
25.0000 ug | INTRAMUSCULAR | Status: DC | PRN
  Administered 2014-01-26: 50 ug via INTRAVENOUS
  Administered 2014-01-27 (×3): 100 ug via INTRAVENOUS
  Administered 2014-01-27 – 2014-01-28 (×2): 50 ug via INTRAVENOUS
  Administered 2014-01-28 (×2): 100 ug via INTRAVENOUS
  Administered 2014-01-28: 50 ug via INTRAVENOUS
  Administered 2014-01-29: 25 ug via INTRAVENOUS
  Administered 2014-01-29: 50 ug via INTRAVENOUS
  Administered 2014-01-29: 100 ug via INTRAVENOUS
  Administered 2014-01-29: 50 ug via INTRAVENOUS
  Administered 2014-01-29 (×2): 25 ug via INTRAVENOUS
  Filled 2014-01-26 (×15): qty 2

## 2014-01-26 MED ORDER — MAGNESIUM SULFATE 50 % IJ SOLN
4.0000 g | Freq: Once | INTRAVENOUS | Status: DC
  Filled 2014-01-26: qty 8

## 2014-01-26 MED ORDER — SODIUM BICARBONATE 8.4 % IV SOLN
INTRAVENOUS | Status: DC
  Administered 2014-01-26 – 2014-01-28 (×3): via INTRAVENOUS
  Filled 2014-01-26 (×6): qty 150

## 2014-01-26 MED ORDER — OXYCODONE HCL 5 MG PO TABS
5.0000 mg | ORAL_TABLET | Freq: Two times a day (BID) | ORAL | Status: DC | PRN
  Administered 2014-01-26 – 2014-01-30 (×7): 5 mg via ORAL
  Filled 2014-01-26 (×7): qty 1

## 2014-01-26 MED ORDER — PANTOPRAZOLE SODIUM 40 MG PO TBEC
40.0000 mg | DELAYED_RELEASE_TABLET | Freq: Every day | ORAL | Status: DC
  Administered 2014-01-27 – 2014-02-05 (×10): 40 mg via ORAL
  Filled 2014-01-26 (×10): qty 1

## 2014-01-26 MED ORDER — MAGNESIUM SULFATE 4000MG/100ML IJ SOLN
4.0000 g | Freq: Once | INTRAMUSCULAR | Status: AC
  Administered 2014-01-26: 4 g via INTRAVENOUS
  Filled 2014-01-26: qty 100

## 2014-01-26 NOTE — Progress Notes (Addendum)
ANTIBIOTIC CONSULT NOTE - FOLLOW UP  Pharmacy Consult for Vanco/Zosyn Indication: Sepsis, lumbar wound abscess   Allergies  Allergen Reactions  . Ciprofloxacin Swelling  . Azithromycin Itching and Rash  . Food Itching and Rash    "Greek yogurt"    Patient Measurements: Height: 5' 4.96" (165 cm) Weight: 259 lb 14.8 oz (117.9 kg) IBW/kg (Calculated) : 56.91 Adjusted Body Weight: 75.3 kg  Vital Signs: Temp: 100.2 F (37.9 C) (06/15 1000) Temp src: Core (Comment) (06/15 0323) BP: 110/76 mmHg (06/15 1000) Pulse Rate: 101 (06/15 1000) Intake/Output from previous day: 06/14 0701 - 06/15 0700 In: 6843.7 [P.O.:480; I.V.:3613.7; IV Piggyback:2750] Out: 795 [Urine:625; Drains:170] Intake/Output from this shift: Total I/O In: 450 [I.V.:450] Out: -   Labs:  Recent Labs  01/24/14 2111  01/25/14 0229 01/25/14 0512 01/26/14 0500  WBC 19.2*  --   --  18.3* 15.3*  HGB 9.8*  < > 7.5* 9.6* 8.4*  PLT 262  --   --  222 167  CREATININE 0.91  --   --  0.95 2.83*  < > = values in this interval not displayed. Estimated Creatinine Clearance: 34.6 ml/min (by C-G formula based on Cr of 2.83). No results found for this basename: VANCOTROUGH, Corlis Leak, VANCORANDOM, GENTTROUGH, GENTPEAK, GENTRANDOM, TOBRATROUGH, TOBRAPEAK, TOBRARND, AMIKACINPEAK, AMIKACINTROU, AMIKACIN,  in the last 72 hours   Microbiology: Recent Results (from the past 720 hour(s))  SURGICAL PCR SCREEN     Status: None   Collection Time    01/13/14  1:31 PM      Result Value Ref Range Status   MRSA, PCR NEGATIVE  NEGATIVE Final   Staphylococcus aureus NEGATIVE  NEGATIVE Final   Comment:            The Xpert SA Assay (FDA     approved for NASAL specimens     in patients over 28 years of age),     is one component of     a comprehensive surveillance     program.  Test performance has     been validated by [employer] for patients greater     than or equal to 67 year old.     It is not intended     to  diagnose infection nor to     guide or monitor treatment.  MRSA PCR SCREENING     Status: None   Collection Time    01/18/14 11:52 PM      Result Value Ref Range Status   MRSA by PCR NEGATIVE  NEGATIVE Final   Comment:            The GeneXpert MRSA Assay (FDA     approved for NASAL specimens     only), is one component of a     comprehensive MRSA colonization     surveillance program. It is not     intended to diagnose MRSA     infection nor to guide or     monitor treatment for     MRSA infections.  CULTURE, BLOOD (ROUTINE X 2)     Status: None   Collection Time    01/24/14  9:11 PM      Result Value Ref Range Status   Specimen Description BLOOD LEFT ARM   Final   Special Requests BOTTLES DRAWN AEROBIC AND ANAEROBIC 10CC EACH   Final   Culture  Setup Time     Final   Value: 01/25/2014 00:41  Performed at [facility]     Final   Value: GRAM POSITIVE COCCI IN CHAINS     Note: Gram Stain Report Called to,Read Back By and Verified With: THOMAS LILLEY 01/25/14 @ 8:14PM BY RUSCOE A.     Performed at [facility]   Report Status PENDING   Incomplete  CULTURE, BLOOD (ROUTINE X 2)     Status: None   Collection Time    01/24/14 10:41 PM      Result Value Ref Range Status   Specimen Description BLOOD LEFT ARM   Final   Special Requests BOTTLES DRAWN AEROBIC AND ANAEROBIC [facility] EACH   Final   Culture  Setup Time     Final   Value: 01/25/2014 14:08     Performed at [facility]   Culture     Final   Value:        BLOOD CULTURE RECEIVED NO GROWTH TO DATE CULTURE WILL BE HELD FOR 5 DAYS BEFORE ISSUING A FINAL NEGATIVE REPORT     Performed at [facility]   Report Status PENDING   Incomplete  WOUND CULTURE     Status: None   Collection Time    01/25/14  1:48 AM      Result Value Ref Range Status   Specimen Description WOUND BACK   Final   Special Requests NONE   Final   Gram Stain     Final   Value: ABUNDANT WBC PRESENT,BOTH PMN AND  MONONUCLEAR     FEW GRAM POSITIVE COCCI IN PAIRS     RARE GRAM NEGATIVE RODS     Gram Stain Report Called to,Read Back By and Verified With: Gram Stain Report Called to,Read Back By and Verified With: Denyse Amass RN [id number] BY GREEN R Performed at [facility]     Performed at [facility]   Culture     Final   Value: MODERATE [place]     Performed at [facility]   Report Status PENDING   Incomplete  GRAM STAIN     Status: None   Collection Time    01/25/14  1:48 AM      Result Value Ref Range Status   Specimen Description WOUND BACK   Final   Special Requests NONE   Final   Gram Stain     Final   Value: ABUNDANT WBC PRESENT,BOTH PMN AND MONONUCLEAR     FEW GRAM POSITIVE COCCI IN PAIRS     RARE GRAM NEGATIVE RODS     Gram Stain Report Called to,Read Back By and Verified With: Denyse Amass RN [id number] GREEN R   Report Status 01/25/2014 FINAL   Final  MRSA PCR SCREENING     Status: None   Collection Time    01/25/14  3:05 AM      Result Value Ref Range Status   MRSA by PCR NEGATIVE  NEGATIVE Final   Comment:            The GeneXpert MRSA Assay (FDA     approved for NASAL specimens     only), is one component of a     comprehensive MRSA colonization     surveillance program. It is not     intended to diagnose MRSA     infection nor to guide or     monitor treatment for     MRSA infections.    Anti-infectives  Start     Dose/Rate Route Frequency Ordered Stop   01/25/14 0600  piperacillin-tazobactam (ZOSYN) IVPB 3.375 g     3.375 g 12.5 mL/hr over 240 Minutes Intravenous 3 times per day 01/24/14 2209     01/25/14 0600  vancomycin (VANCOCIN) IVPB 1000 mg/200 mL premix     1,000 mg 200 mL/hr over 60 Minutes Intravenous Every 8 hours 01/24/14 2209     01/25/14 0315  ceFAZolin (ANCEF) IVPB 1 g/50 mL premix  Status:  Discontinued     1 g 100 mL/hr over 30 Minutes Intravenous Every 8 hours 01/25/14 0303 01/25/14 0308   01/25/14 0315   piperacillin-tazobactam (ZOSYN) IVPB 3.375 g  Status:  Discontinued     3.375 g 100 mL/hr over 30 Minutes Intravenous  Once 01/25/14 0303 01/25/14 0309   01/25/14 0315  vancomycin (VANCOCIN) IVPB 1000 mg/200 mL premix  Status:  Discontinued     1,000 mg 200 mL/hr over 60 Minutes Intravenous  Once 01/25/14 0303 01/25/14 0310   01/25/14 0159  vancomycin (VANCOCIN) powder  Status:  Discontinued       As needed 01/25/14 0200 01/25/14 0241   01/25/14 0136  vancomycin (VANCOCIN) 1000 MG powder    Comments:  Mcphail, Nancy   : cabinet override      01/25/14 0136 01/25/14 1344   01/25/14 0113  bacitracin 50,000 Units in sodium chloride irrigation 0.9 % 500 mL irrigation  Status:  Discontinued       As needed 01/25/14 0114 01/25/14 0241   01/24/14 2215  piperacillin-tazobactam (ZOSYN) IVPB 3.375 g     3.375 g 100 mL/hr over 30 Minutes Intravenous  Once 01/24/14 2207 01/24/14 2306   01/24/14 2215  vancomycin (VANCOCIN) 2,000 mg in sodium chloride 0.9 % 500 mL IVPB     2,000 mg 250 mL/hr over 120 Minutes Intravenous  Once 01/24/14 2207 01/25/14 0121      Assessment: 56 YOF s/p lumbar fusion surgery on 01/16/14 now with purulent drainage and fever.  Anticoag: None with recent surgery, SCDs  ID: Lumbar wound abscess with sepsis s/p I&D on 6/16, Pulmonary edema vs PNA,  WBC 15.3 down slightly, Tmax 102.4, Tc 100.2, LA 1.6 Vanc 6/13 >> 6/16 Vanco trough=55.5 Zosyn 6/13 >>  6/14 MRSA PCR (-) 6/14 Wound back (post abx) >>GNR 6/13 Blood >>GPC x 1  CV: VSS but HR 101. On Lipitor, Coreg.  Endo: Insulin gtt off, CBG 135-199s on SSI - home Victoza resumed.   GI / Nutrition: LFTs ok. Po PPI  Neurology:  resumed Neurontin, Topamax.  Nephrology: SCr 0.95>>2.83?? UOP only 0.2. Na+Bicarb drip started. (+>6L last 24h). Mg 1.5.  Pulmonary: Extubated 6/14  Hematology / Oncology: Hgb donw to 8.4, Fibrinogen 749  PTA Medication Issues: amitriptyline  Best Practices: SCD   Goal of Therapy:   Vancomycin trough level 15-20 mcg/ml  Plan:  Decrease Zosyn to 2.25g IV q6h Hold Vancomycin and monitor ARF   Christiona Siddique S. Alford Highland, PharmD, [facility] Clinical Staff Pharmacist Pager [phone]  Eilene Ghazi Stillinger 01/26/2014,11:40 AM

## 2014-01-26 NOTE — Progress Notes (Signed)
RN in to assess patient at 1500. Patient is disoriented to time and situation. Remains oriented to self and place. Patient moving extremities equally and pupils are reactive. Patient was given 15mg  Oxycodone IR at 0934 for 8/10 back pain. MD notified. Oxycodone order modified to 5mg  BID. Will continue to assess.

## 2014-01-26 NOTE — Progress Notes (Addendum)
PULMONARY  / CRITICAL CARE MEDICINE  Name: Marissa [name] MRN: [id number] DOB: July 12, 1976    ADMISSION DATE:  01/24/2014 CONSULTATION DATE:  01/25/2014  REFERRING MD :  Earnie Larsson, MD PRIMARY SERVICE: Neurosurgery  CHIEF COMPLAINT:  Back pain  BRIEF PATIENT DESCRIPTION:  38 yo female with recent admission for lumbar disc herniation s/p redo decompressive lunbar laminectomy presenting with severe back pain and found to have lumbar wound infection. Patient now s/p lumbar wound irrigation and debridement 01/25/2014 and we are consulted for management of mechanical ventilation. Patient extubated on 6/14 and improving.  SIGNIFICANT EVENTS / STUDIES:  01/16/2014 - Redo decompressive lumbar laminectomy L5-S1 and posterior lumbar interbody fusion 01/21/2014 [facility] discharge 01/24/2014 - Admission for lumbar wound infection 01/25/2014 - Re-exploration of lumbar wound with irrigation and debridement 01/25/2014 - Extubation  LINES / TUBES: L IJ CVL 01/25/2014 >>> L arterial line 01/25/2014 >>>6/14 ETT 01/25/2014 >>>6/14 Foley catheter 01/25/2014 >>> PIV  CULTURES: Wound culture 01/25/2014 >>> mod gram neg rods>>> Blood culture x 2 01/25/2014 >>> gram pos cocci chains>>> Urine culture 01/25/2014 >>> pending  BC x 2 6/15>>>  ANTIBIOTICS: Vancomycin 01/25/2014 >>> Zosyn 01/25/2014 >>>  Subjective:  Extubated, no distress. Complaining of back pain and nonspecific leg pain.  VITAL SIGNS: Temp:  [99.1 F (37.3 C)-101.7 F (38.7 C)] 100 F (37.8 C) (06/15 0700) Pulse Rate:  [54-118] 102 (06/15 0700) Resp:  [0-33] 33 (06/15 0700) BP: (69-137)/(27-94) 104/49 mmHg (06/15 0700) SpO2:  [77 %-100 %] 99 % (06/15 0700) Arterial Line BP: (118-140)/(95-123) 140/123 mmHg (06/14 1015) FiO2 (%):  [30 %] 30 % (06/14 1000)  HEMODYNAMICS:   VENTILATOR SETTINGS: Vent Mode:  [-]  FiO2 (%):  [30 %] 30 %  INTAKE / OUTPUT: Intake/Output     06/14 0701 - 06/15 0700 06/15 0701 - 06/16 0700   P.O. 480     I.V. (mL/kg) 3613.7 (30.7)    Blood     IV Piggyback 2750    Total Intake(mL/kg) 6843.7 (58)    Urine (mL/kg/hr) 625 (0.2)    Drains 170 (0.1)    Total Output 795     Net +6048.7           PHYSICAL EXAMINATION: General:  No acute distress, eating. CVL present in L IJ. Neuro:  Awake, alert, no changes in strength. Generalized weakness. HEENT:  PERRL. Sclera anicteric. No stridor noted. Oral mucosa pink and moist., tender line site rt Cardiovascular:  RRR, no murmurs/rubs/gallops, no peripheral edema.  Lungs:  Mild coarse breath sounds. Resp even and unlabored. Abdomen:  +BS, soft, non-distended, non-tender.  Musculoskeletal:  No edema noted. Pedal pulses present bilaterally. Skin:  Warm, dry. No rashes noted.  LABS:  CBC Recent Labs     01/24/14  2111   01/25/14  0229  01/25/14  0512  01/26/14  0500  WBC  19.2*   --    --   18.3*  15.3*  HGB  9.8*   < >  7.5*  9.6*  8.4*  HCT  29.6*   < >  22.0*  29.7*  25.8*  PLT  262   --    --   222  167   < > = values in this interval not displayed.   Coag's Recent Labs     01/25/14  0512  APTT  30  INR  1.38   BMET Recent Labs     01/24/14  2111   01/25/14  0229  01/25/14  2458  01/26/14  0500  NA  135*   < >  140  141  139  K  4.4   < >  3.1*  3.9  3.9  CL  98   --    --   106  109  CO2  20   --    --   19  17*  BUN  10   --    --   10  19  CREATININE  0.91   --    --   0.95  2.83*  GLUCOSE  508*   --    --   208*  197*   < > = values in this interval not displayed.   Electrolytes Recent Labs     01/24/14  2111  01/25/14  0512  01/26/14  0500  CALCIUM  8.6  8.3*  7.1*  MG   --    --   1.5  PHOS   --    --   4.0   Sepsis Markers No results found for this basename: LACTICACIDVEN, PROCALCITON, O2SATVEN,  in the last 72 hours  ABG Recent Labs     01/25/14  0146  01/25/14  0229  01/25/14  0400  PHART  7.255*  7.226*  7.290*  PCO2ART  34.9*  50.9*  42.0  PO2ART  370.0*  89.0  197.0*   Liver  Enzymes Recent Labs     01/25/14  0512  AST  15  ALT  12  ALKPHOS  79  BILITOT  1.0  ALBUMIN  2.0*   Cardiac Enzymes Recent Labs     01/25/14  0512  TROPONINI  <0.30   Glucose Recent Labs     01/25/14  0903  01/25/14  1156  01/25/14  1520  01/25/14  1913  01/25/14  2340  01/26/14  0325  GLUCAP  135*  162*  167*  185*  199*  182*    Imaging Dg Chest 2 View  01/24/2014   CLINICAL DATA:  Chest pain and weakness.  EXAM: CHEST  2 VIEW  COMPARISON:  One-view chest 01/18/2014.  FINDINGS: Low lung volumes exaggerate the heart size. Patchy low lower airspace disease is present bilaterally, worse on the left. Moderate pulmonary vascular congestion is present. The visualized soft tissues and bony thorax are unremarkable.  IMPRESSION: 1. Patchy lower lobe airspace disease bilaterally, left greater than right. While this may represent atelectasis, infection is not excluded. 2. Low lung volumes. 3. Mild pulmonary vascular congestion.   Electronically Signed   By: Lawrence Santiago M.D.   On: 01/24/2014 21:59   Dg Chest Port 1 View  01/26/2014   CLINICAL DATA:  Post extubation  EXAM: PORTABLE CHEST - 1 VIEW  COMPARISON:  01/25/2014  FINDINGS: Endotracheal tube has been removed. Central venous catheter tip has been reposition with the tip now in the proximal SVC. No pneumothorax  Progression of diffuse bilateral airspace disease which may represent pneumonia or edema. No effusion.  IMPRESSION: Central venous catheter tip now in the SVC. Endotracheal tube removed.  Progression of diffuse bilateral airspace disease.   Electronically Signed   By: Franchot Gallo M.D.   On: 01/26/2014 08:24   Dg Chest Portable 1 View  01/25/2014   CLINICAL DATA:  Sepsis.  EXAM: PORTABLE CHEST - 1 VIEW  COMPARISON:  Chest radiograph performed 01/24/2014  FINDINGS: The patient's endotracheal tube is seen ending 1-2 cm above the carina. This could be retracted by approximately 1 cm,  as deemed clinically appropriate.  A left  IJ line is seen likely extending into an intercostal vessel; would retract this by approximately 2 cm.  The lungs are hypoexpanded. Patchy bilateral airspace opacification may reflect atelectasis or pneumonia. No definite pleural effusion or pneumothorax is seen.  The cardiomediastinal silhouette is borderline normal in size. No acute osseous abnormalities are identified. Cervical spinal fusion hardware is partially imaged.  IMPRESSION: 1. Endotracheal tube seen ending 1-2 cm above the carina. This could be retracted by approximately 1 cm, as deemed clinically appropriate. 2. Left IJ line noted likely extending into an intercostal vessel; would retract this by approximately 2 cm. 3. Lungs hypoexpanded. Patchy bilateral airspace opacification may reflect atelectasis or pneumonia.  These results were called by telephone at the time of interpretation on 01/25/2014 at 4:26 AM to [facility] RN, who verbally acknowledged these results.   Electronically Signed   By: Garald Balding M.D.   On: 01/25/2014 04:27   ASSESSMENT / PLAN:  PULMONARY A:  Hypoxic respiratory failure requiring mechanical ventilation post-procedure, resolving Poss pulmonary edema vs. PNA  P:   - IS - Upright - Duonebs PRN - Rpt CXR  CARDIOVASCULAR A:  Hypertension Intermittent hypotension P:  - IVF - Continue home carvedilol, statin - Tele -NO ACEI -MAp goal 65  RENAL A: Acute Kidney Injury - secondary to hypotension = ATN Pos over 6 liters last 24 hrs  NON AG acidosis secondary to saline, r/o RTA 4 hypomagensemia P: - Monitor SrCr/BUN, lytes in am  - even balance goals today, clinically appears well, no role lasix today -establish volume status - cvp, ua, urine na, osm, serum uric acid -kvo, until above noted, cvp ex, was over 6 liters up -if crt rises in am , - renal US -avoid saline, add bicarb as drop to 17 and she will not corerect on her own -replace Mag  GASTROINTESTINAL A:   GERD GI prophylaxis P:    - Pantoprazole -diet  HEMATOLOGIC A:   Leukocytosis DVT prophylaxis Anemia, dilutional  P:  - Leukocytosis likely related to infection, continue to monitor - Holdng anticoagulation for now in setting of recent surgery > SCD -reduce volume  INFECTIOUS A:   Lumbar wound infection Blood stream infection - r/o line source , wound P:   -follow finals - Continue vancomycin and zosyn and narrow pending culture data - [facility] 6/14 w/ gram + cocci in chains, rpt BC x2 today - Urine cx pending -dc line central , assess if hematoma or abscess under  ENDOCRINE A:  Diabetes mellitus   P:   - Liraglutide (Victoza) - SSI  NEUROLOGIC A: Chronic pain P:   - Fentanyl, Oxy-IR PRN - Imitrex PRN - Neurontin, Topamax restarted - Hold home Elavil   Lowella Dell. Reese, PA-S  Global: Extubated well, ARF, non ag, establish voume status, atn, supp mag, bicarb added  I have fully examined this patient and agree with above findings.    And edited in full  Lavon Paganini. Titus Mould, MD, [place] Pgr: [facility]

## 2014-01-26 NOTE — Progress Notes (Signed)
Subjective: Patient reports She's feeling better still a back pain but leg pain is improved  Objective: Vital signs in last 24 hours: Temp:  [98.2 F (36.8 C)-102.4 F (39.1 C)] 100 F (37.8 C) (06/15 0700) Pulse Rate:  [54-118] 102 (06/15 0700) Resp:  [0-33] 33 (06/15 0700) BP: (69-170)/(27-104) 104/49 mmHg (06/15 0700) SpO2:  [77 %-100 %] 99 % (06/15 0700) Arterial Line BP: (110-190)/(88-123) 140/123 mmHg (06/14 1015) FiO2 (%):  [30 %-40 %] 30 % (06/14 1000)  Intake/Output from previous day: 06/14 0701 - 06/15 0700 In: 6843.7 [P.O.:480; I.V.:3613.7; IV Piggyback:2750] Out: 795 [Urine:625; Drains:170] Intake/Output this shift:    Wound clean and dry and left lower extremity distal dorsiflexion plantarflexion seems improved from preop at about 4-4+ out of 5  Lab Results:  Recent Labs  01/25/14 0512 01/26/14 0500  WBC 18.3* 15.3*  HGB 9.6* 8.4*  HCT 29.7* 25.8*  PLT 222 167   BMET  Recent Labs  01/25/14 0512 01/26/14 0500  NA 141 139  K 3.9 3.9  CL 106 109  CO2 19 17*  GLUCOSE 208* 197*  BUN 10 19  CREATININE 0.95 2.83*  CALCIUM 8.3* 7.1*    Studies/Results: Dg Chest 2 View  01/24/2014   CLINICAL DATA:  Chest pain and weakness.  EXAM: CHEST  2 VIEW  COMPARISON:  One-view chest 01/18/2014.  FINDINGS: Low lung volumes exaggerate the heart size. Patchy low lower airspace disease is present bilaterally, worse on the left. Moderate pulmonary vascular congestion is present. The visualized soft tissues and bony thorax are unremarkable.  IMPRESSION: 1. Patchy lower lobe airspace disease bilaterally, left greater than right. While this may represent atelectasis, infection is not excluded. 2. Low lung volumes. 3. Mild pulmonary vascular congestion.   Electronically Signed   By: Lawrence Santiago M.D.   On: 01/24/2014 21:59   Dg Chest Portable 1 View  01/25/2014   CLINICAL DATA:  Sepsis.  EXAM: PORTABLE CHEST - 1 VIEW  COMPARISON:  Chest radiograph performed 01/24/2014   FINDINGS: The patient's endotracheal tube is seen ending 1-2 cm above the carina. This could be retracted by approximately 1 cm, as deemed clinically appropriate.  A left IJ line is seen likely extending into an intercostal vessel; would retract this by approximately 2 cm.  The lungs are hypoexpanded. Patchy bilateral airspace opacification may reflect atelectasis or pneumonia. No definite pleural effusion or pneumothorax is seen.  The cardiomediastinal silhouette is borderline normal in size. No acute osseous abnormalities are identified. Cervical spinal fusion hardware is partially imaged.  IMPRESSION: 1. Endotracheal tube seen ending 1-2 cm above the carina. This could be retracted by approximately 1 cm, as deemed clinically appropriate. 2. Left IJ line noted likely extending into an intercostal vessel; would retract this by approximately 2 cm. 3. Lungs hypoexpanded. Patchy bilateral airspace opacification may reflect atelectasis or pneumonia.  These results were called by telephone at the time of interpretation on 01/25/2014 at 4:26 AM to [facility] RN, who verbally acknowledged these results.   Electronically Signed   By: Garald Balding M.D.   On: 01/25/2014 04:27    Assessment/Plan: Progressive mobilization with physical therapy continue to observe in the ICU secondary to intermittent hypotension we'll continue her IV vancomycin and Zosyn we'll consider placement of a PICC line 1 her blood pressure stabilizes will be move her to the floor.  LOS: 2 days     Izrael Peak P 01/26/2014, 7:31 AM

## 2014-01-26 NOTE — Progress Notes (Signed)
[place] Progress Note Patient Name: Marissa [name] DOB: March 15, 1976 MRN: [id number]  Date of Service  01/26/2014   HPI/Events of Note   Pain, Oxy-IR too strong yesterday  eICU Interventions  Prn fentanyl IV   Intervention Category Intermediate Interventions: Pain - evaluation and management  Abryanna Musolino 01/26/2014, 6:18 AM

## 2014-01-26 NOTE — Progress Notes (Signed)
CRITICAL VALUE ALERT  Critical value received:  Vancomycin 55.5  Date of notification:  01/26/14  Time of notification:  1430  Critical value read back:yes  Nurse who received alert:  Debbora Presto  MD notified (1st page):  Eilene Ghazi  Time of first page:  1430  MD notified (2nd page):  Time of second page:  Responding MD:  Eilene Ghazi  Time MD responded:  1430

## 2014-01-26 NOTE — Anesthesia Postprocedure Evaluation (Signed)
  Anesthesia Post-op Note  Patient: Marissa [name]  Procedure(s) Performed: Procedure(s): LUMBAR WOUND DEBRIDEMENT (N/A)  Patient Location: PACU  Anesthesia Type:General  Level of Consciousness: sedated  Airway and Oxygen Therapy: Patient remains intubated per anesthesia plan  Post-op Pain: none  Post-op Assessment: Post-op Vital signs reviewed, Patient's Cardiovascular Status Stable, Respiratory Function Stable, Patent Airway and Pain level controlled  Post-op Vital Signs: Reviewed and stable  Last Vitals:  Filed Vitals:   01/26/14 1300  BP: 103/59  Pulse: 95  Temp: 37.5 C  Resp: 21    Complications: No apparent anesthesia complications

## 2014-01-26 NOTE — Progress Notes (Signed)
Assessed for 2nd PIV line.  Unable to find a suitable venous access other than the antecubital which patient requested not be stuck.  RN notified of my assessment. Catalina Pizza

## 2014-01-26 NOTE — Evaluation (Signed)
Physical Therapy Evaluation Patient Details Name: Marissa [name] MRN: [id number] DOB: 1975/10/29 Today's Date: 01/26/2014   History of Present Illness  pt presents after Lumbar I+D with multiple recent back surgeries.    Clinical Impression  Pt very motivated for mobility and eager for return to home.  May need encouragement to use RW with mobility as pt preferring to use Bil HHA.  Pt already has needed DME and good support from family.  Will need HHPT.  Will continue to follow acutely.      Follow Up Recommendations Home health PT;Supervision for mobility/OOB    Equipment Recommendations  None recommended by PT    Recommendations for Other Services       Precautions / Restrictions Precautions Precautions: Fall;Back Precaution Booklet Issued: Yes (comment) Precaution Comments: Reviewed precautions.   Required Braces or Orthoses: Spinal Brace Spinal Brace: Lumbar corset;Applied in sitting position Restrictions Weight Bearing Restrictions: No      Mobility  Bed Mobility Overal bed mobility: Needs Assistance Bed Mobility: Rolling;Sidelying to Sit Rolling: Min assist;+2 for physical assistance Sidelying to sit: Mod assist       General bed mobility comments: hamd over hand to A with log roll technique.    Transfers Overall transfer level: Needs assistance Equipment used: 2 person hand held assist Transfers: Sit to/from Stand Sit to Stand: Min assist         General transfer comment: demos good use of UEs.    Ambulation/Gait Ambulation/Gait assistance: Min assist Ambulation Distance (Feet): 20 Feet Assistive device: 2 person hand held assist Gait Pattern/deviations: Step-through pattern;Decreased stride length     General Gait Details: pt preferring to use Bil HHA, though feel pt could have been MinG with RW use.  pt wanting to amb without RW, though may need to use to increase independence and decrease burden of care.    Stairs            Wheelchair  Mobility    Modified Rankin (Stroke Patients Only)       Balance Overall balance assessment: Needs assistance Sitting-balance support: No upper extremity supported;Feet supported Sitting balance-Leahy Scale: Fair     Standing balance support: Single extremity supported Standing balance-Leahy Scale: Poor Standing balance comment: pt able to stand without UE support very briefly with very guarded stance.                               Pertinent Vitals/Pain Back and LE pain 7-8/10 at worst during mobility.  Premedicated.      Home Living Family/patient expects to be discharged to:: Private residence Living Arrangements: Spouse/significant other;Other relatives;Parent Available Help at Discharge: Family;Available 24 hours/day Type of Home: House Home Access: Stairs to enter Entrance Stairs-Rails: None Entrance Stairs-Number of Steps: 3 Home Layout: One level Home Equipment: Walker - 2 wheels;Bedside commode      Prior Function Level of Independence: Independent               Hand Dominance   Dominant Hand: Right    Extremity/Trunk Assessment   Upper Extremity Assessment: Defer to OT evaluation           Lower Extremity Assessment: Generalized weakness         Communication   Communication: No difficulties  Cognition Arousal/Alertness: Awake/alert Behavior During Therapy: WFL for tasks assessed/performed Overall Cognitive Status: Within Functional Limits for tasks assessed  General Comments      Exercises        Assessment/Plan    PT Assessment Patient needs continued PT services  PT Diagnosis Difficulty walking;Generalized weakness;Acute pain   PT Problem List Decreased strength;Decreased activity tolerance;Decreased balance;Decreased mobility;Decreased knowledge of use of DME;Decreased knowledge of precautions;Pain  PT Treatment Interventions DME instruction;Gait training;Stair training;Functional  mobility training;Therapeutic activities;Therapeutic exercise;Balance training;Neuromuscular re-education;Patient/family education   PT Goals (Current goals can be found in the Care Plan section) Acute Rehab PT Goals Patient Stated Goal: No more back surgeries!   PT Goal Formulation: With patient Time For Goal Achievement: 02/09/14 Potential to Achieve Goals: Good    Frequency Min 5X/week   Barriers to discharge        Co-evaluation PT/OT/SLP Co-Evaluation/Treatment: Yes Reason for Co-Treatment: Complexity of the patient's impairments (multi-system involvement) PT goals addressed during session: Mobility/safety with mobility;Balance         End of Session Equipment Utilized During Treatment: Gait belt;Back brace Activity Tolerance: Patient tolerated treatment well Patient left: in chair;with call bell/phone within reach;with family/visitor present Nurse Communication: Mobility status         Time: 0165-5374 PT Time Calculation (min): 31 min   Charges:   PT Evaluation $Initial PT Evaluation Tier I: 1 Procedure PT Treatments $Gait Training: 8-22 mins   PT G CodesCatarina [name], Marissa [name] 01/26/2014, 11:33 AM

## 2014-01-26 NOTE — Evaluation (Signed)
Occupational Therapy Evaluation Patient Details Name: Marissa [name] MRN: [id number] DOB: 1975/08/22 Today's Date: 01/26/2014    History of Present Illness pt presents after Lumbar I+D with multiple recent back surgeries.     Clinical Impression   This 38 year old female was admitted for the above surgery. She will benefit from skilled OT to reinforce back precautions during adls and bathroom transfers.  Pt was mostly independent at home after last surgery but did have some assistance to cross legs for adls.  Goals in acute are for overall supervision for adls and min A for tub transfer.    Follow Up Recommendations  Home health OT;Supervision/Assistance - 24 hour    Equipment Recommendations  None recommended by OT    Recommendations for Other Services       Precautions / Restrictions Precautions Precautions: Fall;Back Precaution Booklet Issued: Yes (comment) Precaution Comments: Reviewed precautions.   Required Braces or Orthoses: Spinal Brace Spinal Brace: Lumbar corset;Applied in sitting position Restrictions Weight Bearing Restrictions: No      Mobility Bed Mobility Overal bed mobility: Needs Assistance Bed Mobility: Rolling;Sidelying to Sit Rolling: Min assist;+2 for physical assistance Sidelying to sit: Mod assist       General bed mobility comments: used pad to assist with rolling  Transfers Overall transfer level: Needs assistance Equipment used: 2 person hand held assist Transfers: Sit to/from Stand Sit to Stand: Min assist         General transfer comment: cues to use legs/keep back straight with sit to stand:  Pt tends to lean forward    Balance Overall balance assessment: Needs assistance Sitting-balance support: No upper extremity supported;Feet supported Sitting balance-Leahy Scale: Fair     Standing balance support: Single extremity supported Standing balance-Leahy Scale: Poor Standing balance comment: pt able to stand without UE support  very briefly with very guarded stance.                              ADL Overall ADL's : Needs assistance/impaired     Grooming: Set up;Sitting   Upper Body Bathing: Set up;Sitting       Upper Body Dressing : Set up;Sitting       Toilet Transfer: Minimal assistance;+2 for safety/equipment;Ambulation (to door then recliner)   Toileting- Clothing Manipulation and Hygiene: Minimal assistance;Sit to/from stand         General ADL Comments: pt very independent natured.  She was able to don brace at eob when brace adjusted looser.  She verbalizes education of back precautions--did not assess crossing legs today.  Pt initially had pain when she got up to sitting and initially dizzy but this resolved.  Pt has a tendency to lean forward with sit to stand.  Needs cues/reinforcement with this for back precautions.  She did not want to use walker--min A for steadiness.  Pt did not get into her tub at home and has not had a BM--will review toilet aide on next visit.       Vision                     Perception     Praxis      Pertinent Vitals/Pain Not rated.  Back painful during bed mobility:  No c/o once up.  Initial lying BP 105/40--improved with sitting     Hand Dominance Right   Extremity/Trunk Assessment Upper Extremity Assessment Upper Extremity Assessment: Overall WFL for tasks assessed  Lower Extremity Assessment Lower Extremity Assessment: Generalized weakness       Communication Communication Communication: No difficulties   Cognition Arousal/Alertness: Awake/alert Behavior During Therapy: WFL for tasks assessed/performed Overall Cognitive Status: Within Functional Limits for tasks assessed                     General Comments       Exercises       Shoulder Instructions      Home Living Family/patient expects to be discharged to:: Private residence Living Arrangements: Spouse/significant other;Other relatives;Parent Available  Help at Discharge: Family;Available 24 hours/day Type of Home: House Home Access: Stairs to enter [employer] of Steps: 3 Entrance Stairs-Rails: None Home Layout: One level     Bathroom Shower/Tub: Teacher, early years/pre: Standard     Home Equipment: Environmental consultant - 2 wheels;Bedside commode   Additional Comments: hasn't showered since last sx      Prior Functioning/Environment Level of Independence: Independent (mostly; significant other assisted crossing legs for adls)             OT Diagnosis: Generalized weakness;Acute pain   OT Problem List: Decreased strength;Decreased activity tolerance;Impaired balance (sitting and/or standing);Decreased knowledge of use of DME or AE;Pain;Decreased knowledge of precautions   OT Treatment/Interventions: Self-care/ADL training;DME and/or AE instruction;Patient/family education;Therapeutic activities    OT Goals(Current goals can be found in the care plan section) Acute Rehab OT Goals Patient Stated Goal: No more back surgeries!   OT Goal Formulation: With patient Time For Goal Achievement: 02/09/14 Potential to Achieve Goals: Good ADL Goals Pt Will Transfer to Toilet: with supervision;ambulating;bedside commode Pt Will Perform Tub/Shower Transfer: with min assist;ambulating;3 in 1 (vs verbalizing technique) Additional ADL Goal #1: pt will follow back precautions with sit to stand and during adls without cues  OT Frequency: Min 2X/week   Barriers to D/C:            Co-evaluation PT/OT/SLP Co-Evaluation/Treatment: Yes Reason for Co-Treatment: Complexity of the patient's impairments (multi-system involvement) PT goals addressed during session: Mobility/safety with mobility OT goals addressed during session: ADL's and self-care      End of Session    Activity Tolerance: Patient tolerated treatment well Patient left: in chair;with call bell/phone within reach;with family/visitor present   Time: 1224-4975 OT  Time Calculation (min): 31 min Charges:  OT General Charges $OT Visit: 1 Procedure OT Evaluation $Initial OT Evaluation Tier I: 1 Procedure OT Treatments $Self Care/Home Management : 8-22 mins G-Codes:    Clancy Leiner Jan 31, 2014, 11:48 AM  Lesle Chris, OTR/L [phone] 01/31/2014

## 2014-01-26 NOTE — Progress Notes (Signed)
Dear Doctor: This patient has been identified as a candidate for PICC for the following reason (s): drug pH or osmolality (causing phlebitis, infiltration in 24 hours) If you agree, please write an order for the indicated device. For any questions contact the Vascular Access Team at [phone] if no answer, please leave a message.  Thank you for supporting the early vascular access assessment program. Reshaun Briseno M  

## 2014-01-27 ENCOUNTER — Inpatient Hospital Stay (HOSPITAL_COMMUNITY): Payer: Medicare HMO

## 2014-01-27 ENCOUNTER — Encounter (HOSPITAL_COMMUNITY): Payer: Self-pay | Admitting: Neurosurgery

## 2014-01-27 DIAGNOSIS — J9600 Acute respiratory failure, unspecified whether with hypoxia or hypercapnia: Secondary | ICD-10-CM | POA: Diagnosis not present

## 2014-01-27 DIAGNOSIS — M5126 Other intervertebral disc displacement, lumbar region: Secondary | ICD-10-CM | POA: Diagnosis not present

## 2014-01-27 DIAGNOSIS — R82998 Other abnormal findings in urine: Secondary | ICD-10-CM | POA: Diagnosis not present

## 2014-01-27 DIAGNOSIS — A419 Sepsis, unspecified organism: Secondary | ICD-10-CM | POA: Diagnosis not present

## 2014-01-27 DIAGNOSIS — M549 Dorsalgia, unspecified: Secondary | ICD-10-CM | POA: Diagnosis not present

## 2014-01-27 DIAGNOSIS — A409 Streptococcal sepsis, unspecified: Secondary | ICD-10-CM | POA: Diagnosis not present

## 2014-01-27 DIAGNOSIS — T8140XA Infection following a procedure, unspecified, initial encounter: Secondary | ICD-10-CM | POA: Diagnosis not present

## 2014-01-27 DIAGNOSIS — N179 Acute kidney failure, unspecified: Secondary | ICD-10-CM | POA: Diagnosis not present

## 2014-01-27 LAB — LACTIC ACID, PLASMA: Lactic Acid, Venous: 1.5 mmol/L (ref 0.5–2.2)

## 2014-01-27 LAB — GLUCOSE, CAPILLARY
GLUCOSE-CAPILLARY: 145 mg/dL — AB (ref 70–99)
Glucose-Capillary: 115 mg/dL — ABNORMAL HIGH (ref 70–99)
Glucose-Capillary: 231 mg/dL — ABNORMAL HIGH (ref 70–99)
Glucose-Capillary: 199 mg/dL — ABNORMAL HIGH (ref 70–99)
Glucose-Capillary: 254 mg/dL — ABNORMAL HIGH (ref 70–99)

## 2014-01-27 LAB — BASIC METABOLIC PANEL
BUN: 24 mg/dL — AB (ref 6–23)
BUN: 26 mg/dL — ABNORMAL HIGH (ref 6–23)
BUN: 27 mg/dL — AB (ref 6–23)
CALCIUM: 7.8 mg/dL — AB (ref 8.4–10.5)
CHLORIDE: 105 meq/L (ref 96–112)
CHLORIDE: 102 meq/L (ref 96–112)
CO2: 18 mEq/L — ABNORMAL LOW (ref 19–32)
CO2: 20 mEq/L (ref 19–32)
CO2: 17 mEq/L — ABNORMAL LOW (ref 19–32)
CREATININE: 4.45 mg/dL — AB (ref 0.50–1.10)
CREATININE: 4.55 mg/dL — AB (ref 0.50–1.10)
Calcium: 7.4 mg/dL — ABNORMAL LOW (ref 8.4–10.5)
Calcium: 7.6 mg/dL — ABNORMAL LOW (ref 8.4–10.5)
Chloride: 106 mEq/L (ref 96–112)
Creatinine, Ser: 4.02 mg/dL — ABNORMAL HIGH (ref 0.50–1.10)
GFR calc Af Amer: 15 mL/min — ABNORMAL LOW (ref >90)
GFR calc Af Amer: 13 mL/min — ABNORMAL LOW (ref >90)
GFR calc Af Amer: 13 mL/min — ABNORMAL LOW (ref >90)
GFR calc non Af Amer: 13 mL/min — ABNORMAL LOW (ref >90)
GFR calc non Af Amer: 12 mL/min — ABNORMAL LOW (ref >90)
GFR calc non Af Amer: 11 mL/min — ABNORMAL LOW (ref >90)
GLUCOSE: 156 mg/dL — AB (ref 70–99)
Glucose, Bld: 135 mg/dL — ABNORMAL HIGH (ref 70–99)
Glucose, Bld: 254 mg/dL — ABNORMAL HIGH (ref 70–99)
POTASSIUM: 3.8 meq/L (ref 3.7–5.3)
Potassium: 3.9 mEq/L (ref 3.7–5.3)
Potassium: 4.1 mEq/L (ref 3.7–5.3)
Sodium: 140 mEq/L (ref 137–147)
Sodium: 142 mEq/L (ref 137–147)
Sodium: 137 mEq/L (ref 137–147)

## 2014-01-27 LAB — WOUND CULTURE

## 2014-01-27 LAB — URINE CULTURE: Colony Count: >=100000

## 2014-01-27 MED ORDER — FUROSEMIDE 10 MG/ML IJ SOLN
40.0000 mg | Freq: Two times a day (BID) | INTRAMUSCULAR | Status: DC
Start: 2014-01-27 — End: 2014-01-28
  Administered 2014-01-27 – 2014-01-28 (×3): 40 mg via INTRAVENOUS
  Filled 2014-01-27 (×5): qty 4

## 2014-01-27 MED ORDER — GABAPENTIN 600 MG PO TABS
600.0000 mg | ORAL_TABLET | Freq: Every day | ORAL | Status: DC
  Administered 2014-01-28 – 2014-01-29 (×2): 600 mg
  Filled 2014-01-27: qty 1

## 2014-01-27 MED ORDER — CLINDAMYCIN PHOSPHATE 600 MG/50ML IV SOLN
600.0000 mg | Freq: Four times a day (QID) | INTRAVENOUS | Status: DC
  Administered 2014-01-27 – 2014-01-28 (×4): 600 mg via INTRAVENOUS
  Filled 2014-01-27 (×6): qty 50

## 2014-01-27 NOTE — Progress Notes (Signed)
Physical Therapy Treatment Patient Details Name: Marissa [name] MRN: [id number] DOB: 11/25/1975 Today's Date: 01/27/2014    History of Present Illness pt presents after Lumbar I+D with multiple recent back surgeries.      PT Comments    Patient demonstrates improvements in functional activity tolerance today, able to ambulate increased distance without assist. Spent extensive time with patient discussing her frustrations regarding possibility of extended stay. Provided management techniques for long stay for functional mobility, cognition control, as well as edema control with limited mobility.  Will continue to work with patient and progress activity as tolerated.   Follow Up Recommendations  Home health PT;Supervision for mobility/OOB     Equipment Recommendations  None recommended by PT    Recommendations for Other Services       Precautions / Restrictions Precautions Precautions: Fall;Back Precaution Booklet Issued: Yes (comment) Precaution Comments: Reviewed precautions.   Required Braces or Orthoses: Spinal Brace Spinal Brace: Lumbar corset;Applied in sitting position Restrictions Weight Bearing Restrictions: No    Mobility  Bed Mobility               General bed mobility comments: recieved up in chair  Transfers Overall transfer level: Needs assistance Equipment used: Rolling walker (2 wheeled) Transfers: Sit to/from Stand Sit to Stand: Min assist         General transfer comment: VCs for hand placement, increased pain during transistion  Ambulation/Gait Ambulation/Gait assistance: Min guard Ambulation Distance (Feet): 180 Feet Assistive device: Rolling walker (2 wheeled) Gait Pattern/deviations: Step-through pattern;Decreased stride length         Stairs            Wheelchair Mobility    Modified Rankin (Stroke Patients Only)       Balance     Sitting balance-Leahy Scale: Fair       Standing balance-Leahy Scale: Poor                      Cognition Arousal/Alertness: Awake/alert Behavior During Therapy: WFL for tasks assessed/performed Overall Cognitive Status: Within Functional Limits for tasks assessed                      Exercises      General Comments General comments (skin integrity, edema, etc.): spoke with patient regarding her concerns for extended hospital stay, provided some techniques to help facilitate physiological recovery and prevention of delerium. Patient very appreciative. Patient also educate on edema control technique for swelling accumulation.      Pertinent Vitals/Pain 6/10    Home Living                      Prior Function            PT Goals (current goals can now be found in the care plan section) Acute Rehab PT Goals Patient Stated Goal: No more back surgeries!   PT Goal Formulation: With patient Time For Goal Achievement: 02/09/14 Potential to Achieve Goals: Good Progress towards PT goals: Progressing toward goals    Frequency  Min 5X/week    PT Plan Current plan remains appropriate    Co-evaluation             End of Session Equipment Utilized During Treatment: Gait belt;Back brace Activity Tolerance: Patient tolerated treatment well Patient left: in chair;with call bell/phone within reach;with family/visitor present     Time: 0900-0940 PT Time Calculation (min): 40 min  Charges:  $Gait Training: 8-22  mins $Therapeutic Activity: 8-22 mins $Self Care/Home Management: 8-22                    G CodesDuncan Dull 02-17-2014, 10:02 AM Alben Deeds, PT DPT  [phone]

## 2014-01-27 NOTE — Progress Notes (Signed)
Occupational Therapy Treatment Patient Details Name: Marissa [name] MRN: [id number] DOB: 04-03-1976 Today's Date: 01/27/2014    History of present illness pt presents after Lumbar I+D with multiple recent back surgeries.     OT comments  Pt with slow progress.  Pt found out today she may be in hospital for awhile and was not happy about this.  Spoke to her about things she and her family can do in the room to remain as independent as possible during this hospitalization.  Pt most limited by pain.  Follow Up Recommendations  Home health OT;Supervision/Assistance - 24 hour    Equipment Recommendations  None recommended by OT    Recommendations for Other Services      Precautions / Restrictions Precautions Precautions: Fall;Back Precaution Booklet Issued: Yes (comment) Precaution Comments: Reviewed precautions.  Pt recalled 2/3 verbally but still has hard time following those precautions.  Sister reports pt has a poor memory. Required Braces or Orthoses: Spinal Brace Spinal Brace: Lumbar corset;Applied in sitting position Restrictions Weight Bearing Restrictions: No       Mobility Bed Mobility Overal bed mobility: Needs Assistance Bed Mobility: Rolling;Sidelying to Sit Rolling: Min assist;+2 for physical assistance Sidelying to sit: Min assist     Sit to sidelying: Min assist General bed mobility comments: Pt needed asssit to get feet back into the bed when lying down.    Transfers Overall transfer level: Needs assistance Equipment used: Rolling walker (2 wheeled) Transfers: Sit to/from Stand Sit to Stand: Min assist Stand pivot transfers: Min assist       General transfer comment: VCs for hand placement, increased pain during transistion    Balance Overall balance assessment: Needs assistance Sitting-balance support: Feet supported;No upper extremity supported Sitting balance-Leahy Scale: Fair Sitting balance - Comments: pt would not agree to deflate the bed and  feel sitting balance would be better with a delfated bed.   Standing balance support: Bilateral upper extremity supported;During functional activity Standing balance-Leahy Scale: Poor Standing balance comment: pt mut have walker or external support to stand.                   ADL Overall ADL's : Needs assistance/impaired Eating/Feeding: Independent;Sitting   Grooming: Oral care;Wash/dry face;Min guard;Standing Grooming Details (indicate cue type and reason): pt fatigues quickly when standing.  Pt stood for appx 5 minutes with min guard. Offered pt a rest in chair but pt declined.         Upper Body Dressing : Set up;Sitting Upper Body Dressing Details (indicate cue type and reason): pt donned brace Ily but did need cues to tighten the brace when transitioning from sitting to standing. Lower Body Dressing: Moderate assistance;Sit to/from stand Lower Body Dressing Details (indicate cue type and reason): Pt able to cross legs wtih min assist.  Pt used to pull on socks to get feet up PTA.  Pt may need AE to dress LE. Toilet Transfer: Minimal assistance;Ambulation;RW Toilet Transfer Details (indicate cue type and reason): Pt fatigues quickly but is able to stand to get to the bathroom. Toileting- Clothing Manipulation and Hygiene: Minimal assistance;Sit to/from stand Toileting - Clothing Manipulation Details (indicate cue type and reason): pt unable to reach behind her yet to clean self but can reach front peri area.     Functional mobility during ADLs: Minimal assistance;Rolling walker General ADL Comments: Pt motivated to do as much as possible for herself but does like to do it her way.  Pt had 6/10 back pain with transitions  only.      Vision                     Perception     Praxis      Cognition   Behavior During Therapy: [facility] for tasks assessed/performed Overall Cognitive Status: Within Functional Limits for tasks assessed       Memory: Decreased recall  of precautions               Extremity/Trunk Assessment               Exercises     Shoulder Instructions       General Comments      Pertinent Vitals/ Pain       Pt reported 6/10 pain in back during transitional movements.  BP was 94/64.  Home Living                                          Prior Functioning/Environment              Frequency Min 2X/week     Progress Toward Goals  OT Goals(current goals can now be found in the care plan section)  Progress towards OT goals: Progressing toward goals  Acute Rehab OT Goals Patient Stated Goal: No more back surgeries!   OT Goal Formulation: With patient Time For Goal Achievement: 02/09/14 Potential to Achieve Goals: Good ADL Goals Pt Will Perform Lower Body Bathing: with min assist;with caregiver independent in assisting;sit to/from stand;with adaptive equipment Pt Will Perform Lower Body Dressing: with min assist;with caregiver independent in assisting;sit to/from stand;with adaptive equipment Pt Will Transfer to Toilet: with supervision;ambulating;bedside commode Pt Will Perform Toileting - Clothing Manipulation and hygiene: with set-up;with adaptive equipment;sit to/from stand;sitting/lateral leans Pt Will Perform Tub/Shower Transfer: with min assist;ambulating;3 in 1 Additional ADL Goal #1: pt will follow back precautions with sit to stand and during adls without cues  Plan Discharge plan remains appropriate    Co-evaluation                 End of Session Equipment Utilized During Treatment: Rolling walker   Activity Tolerance Patient tolerated treatment well   Patient Left in bed;with call bell/phone within reach;with nursing/sitter in room   Nurse Communication Mobility status        Time: 1030-1102 OT Time Calculation (min): 32 min  Charges: OT General Charges $OT Visit: 1 Procedure OT Treatments $Self Care/Home Management : 23-37 mins  Glenford Peers 01/27/2014, 11:11 AM [phone]

## 2014-01-27 NOTE — Progress Notes (Signed)
Subjective: Marissa reports Marissa [name] feeling better with less pain her back no leg pain  Objective: Vital signs in last 24 hours: Temp:  [98.8 F (37.1 C)-100.9 F (38.3 C)] 99.1 F (37.3 C) (06/16 0700) Pulse Rate:  [88-128] 128 (06/16 0700) Resp:  [14-32] 25 (06/16 0700) BP: (90-145)/(37-84) 115/71 mmHg (06/16 0700) SpO2:  [85 %-100 %] 95 % (06/16 0700)  Intake/Output from previous day: 06/15 0701 - 06/16 0700 In: 3677.5 [P.O.:840; I.V.:1610; IV Piggyback:237.5] Out: 95 [Drains:95] Intake/Output this shift:    Neurologic improvement in left lower extremity function incision and bandaging to be clean dry and intact  Lab Results:  Recent Labs  01/26/14 0500 01/26/14 1115  WBC 15.3* 15.5*  HGB 8.4* 8.4*  HCT 25.8* 25.2*  PLT 167 170   BMET  Recent Labs  01/26/14 0500 01/27/14 0233  NA 139 140  K 3.9 3.8  CL 109 105  CO2 17* 18*  GLUCOSE 197* 135*  BUN 19 24*  CREATININE 2.83* 4.02*  CALCIUM 7.1* 7.4*    Studies/Results: Dg Chest Port 1 View  01/26/2014   CLINICAL DATA:  Post extubation  EXAM: PORTABLE CHEST - 1 VIEW  COMPARISON:  01/25/2014  FINDINGS: Endotracheal tube has been removed. Central venous catheter tip has been reposition with the tip now in the proximal SVC. No pneumothorax  Progression of diffuse bilateral airspace disease which may represent pneumonia or edema. No effusion.  IMPRESSION: Central venous catheter tip now in the SVC. Endotracheal tube removed.  Progression of diffuse bilateral airspace disease.   Electronically Signed   By: Franchot Gallo M.D.   On: 01/26/2014 08:24    Assessment/Plan: Progressive mobilization today kidney function continues to deteriorate will discuss with critical-care medicine consult and nephrology will also consult infectious disease. Marissa remains on vancomycin and Zosyn  LOS: 3 days     CRAM,GARY P 01/27/2014, 7:51 AM

## 2014-01-27 NOTE — Consult Note (Signed)
[facility]  Total days of antibiotics 4        Day 4 vanco        Day 4 piptazo               Reason for Consult: lumbar SSI    Referring Physician: [name]  Active Problems:   Wound infection   Acute respiratory failure    HPI: Marissa [name] is a 38 y.o. female with hx of HTN ,DM, obesity underwent laminectomy c/b post op wound infection. Patient had a redo decompressive lumbar laminectomy (L5-S1) and posterior lumbar interbody fusion on 01/16/14 with Dr. [name]. She was discharged on 01/21/14. She states she was doing well until 01/24/14 when she developed a fever (max 102.8) , fatigue, generalized weakness, and increased post-op pain. Her pain is in her lower back around her incision site, which is described as sharp and worse with movement. She has been taking her oxycodone with no relief in her symptoms. She also states she has had purulent and serosanguinous drainage from the inferior aspect of surgical site. She was empirically started on vanco and piptazo. Neurosurgery consulted and is taking the patient emergently to the OR.  Labs revealed WBC of 19K with  90%N, on physical exam there may have been nuchal rigiditiy.She was taken to the OR on 6/14 for irrigation and debridement of superficial and deep wound space infection.OR note commented on the purulence continued down into the epidural space. The wound was then copiously irrigated. The wound edges were aggressively debrided. Vancomycin powder was placed in the deep wound space. A medium large Hemovac drain was left in the epidural space. Wound was closed with 2 Vicryl sutures in the fascia and then interrupted nylon sutures through the skin. She had hypoxic respiratory failure post operatively and maintained on the vent for one day. Her fever curve and wbc has trended down to 15 but she has developed multifactorial ATN, from sepsis and likely due to supratherapeutic vanco with level of 55.   Her infectious work up  thus far has revealed 1/2 blood cx with gpc in prs, and  OR cx growing gpc in pairs and gram negative organisms. Micro lab thus far identified group A strep    Past Medical History  Diagnosis Date  . Hypertension   . Coronary artery disease   . Neuropathy   . Headache(784.0)   . Arthritis   . Vision loss     due to diabetes  . Myocardial infarction 2006    "due to medication"; no evidence of ischemia or infarction by nuclear stress test '11  . Dysrhythmia     tachycardia  . Diabetes mellitus without complication     fasting cbg 50-140s  . GERD (gastroesophageal reflux disease)     otc meds  . Migraines     once/month maybe - can last up to two weeks  . Hyperlipemia     Allergies:  Allergies  Allergen Reactions  . Ciprofloxacin Swelling  . Azithromycin Itching and Rash  . Food Itching and Rash    "[place] yogurt"    Current antibiotics:   MEDICATIONS: . antiseptic oral rinse  15 mL Mouth Rinse BID  . atorvastatin  80 mg Per Tube QHS  . carvedilol  12.5 mg Oral BID WC  . gabapentin  600 mg Per Tube TID  . insulin aspart  0-20 Units Subcutaneous 6 times per day  . Liraglutide  1.8 mg Subcutaneous Daily  .  pantoprazole  40 mg Oral Daily  . piperacillin-tazobactam (ZOSYN)  IV  2.25 g Intravenous 4 times per day  . senna  1 tablet Oral BID  . sodium chloride  3 mL Intravenous Q12H  . topiramate  125 mg Per Tube QHS    History  Substance Use Topics  . Smoking status: Never Smoker   . Smokeless tobacco: Not on file  . Alcohol Use: No    History reviewed. No pertinent family history.   Review of Systems  Constitutional: Negative for fever, chills, diaphoresis, activity change, appetite change, fatigue and unexpected weight change.  HENT: Negative for congestion, sore throat, rhinorrhea, sneezing, trouble swallowing and sinus pressure.  Eyes: Negative for photophobia and visual disturbance.  Respiratory: Negative for cough, chest tightness, shortness of breath,  wheezing and stridor.  Cardiovascular: Negative for chest pain, palpitations and leg swelling.  Gastrointestinal: Negative for nausea, vomiting, abdominal pain, diarrhea, constipation, blood in stool, abdominal distention and anal bleeding.  Genitourinary: Negative for dysuria, hematuria, flank pain and difficulty urinating.  Musculoskeletal: Negative for myalgias, back pain, joint swelling, arthralgias and gait problem.  Skin: Negative for color change, pallor, rash and wound.  Neurological: Negative for dizziness, tremors, weakness and light-headedness.  Hematological: Negative for adenopathy. Does not bruise/bleed easily.  Psychiatric/Behavioral: Negative for behavioral problems, confusion, sleep disturbance, dysphoric mood, decreased concentration and agitation.     OBJECTIVE: Temp:  [98.8 F (37.1 C)-100.9 F (38.3 C)] 99.1 F (37.3 C) (06/16 0700) Pulse Rate:  [88-128] 99 (06/16 0800) Resp:  [14-32] 20 (06/16 0800) BP: (90-145)/(37-83) 119/53 mmHg (06/16 0800) SpO2:  [85 %-100 %] 100 % (06/16 0800)  Constitutional:  oriented to person, place, and time. appears well-developed and well-nourished. No distress.  HENT:  Mouth/Throat: Oropharynx is clear and moist. No oropharyngeal exudate.  Cardiovascular: Normal rate, regular rhythm and normal heart sounds. Exam reveals no gallop and no friction rub.  No murmur heard.  Pulmonary/Chest: Effort normal and breath sounds normal. No respiratory distress.  has no wheezes.  Abdominal: Soft. Bowel sounds are normal.  exhibits no distension. There is no tenderness.  Lymphadenopathy: no cervical adenopathy.  Neurological: alert and oriented to person, place, and time.  Skin: Skin is warm and dry. No rash noted. No erythema.  Psychiatric: a normal mood and affect. His behavior is normal.   LABS: Results for orders placed during the hospital encounter of 01/24/14 (from the past 48 hour(s))  GLUCOSE, CAPILLARY     Status: Abnormal    Collection Time    01/25/14  9:03 AM      Result Value Ref Range   Glucose-Capillary 135 (*) 70 - 99 mg/dL  GLUCOSE, CAPILLARY     Status: Abnormal   Collection Time    01/25/14 11:56 AM      Result Value Ref Range   Glucose-Capillary 162 (*) 70 - 99 mg/dL  GLUCOSE, CAPILLARY     Status: Abnormal   Collection Time    01/25/14  3:20 PM      Result Value Ref Range   Glucose-Capillary 167 (*) 70 - 99 mg/dL  GLUCOSE, CAPILLARY     Status: Abnormal   Collection Time    01/25/14  7:13 PM      Result Value Ref Range   Glucose-Capillary 185 (*) 70 - 99 mg/dL  GLUCOSE, CAPILLARY     Status: Abnormal   Collection Time    01/25/14 11:40 PM      Result Value Ref Range   Glucose-Capillary  199 (*) 70 - 99 mg/dL   Comment 1 Documented in Chart     Comment 2 Notify RN    GLUCOSE, CAPILLARY     Status: Abnormal   Collection Time    01/26/14  3:25 AM      Result Value Ref Range   Glucose-Capillary 182 (*) 70 - 99 mg/dL   Comment 1 Documented in Chart     Comment 2 Notify RN    BASIC METABOLIC PANEL     Status: Abnormal   Collection Time    01/26/14  5:00 AM      Result Value Ref Range   Sodium 139  137 - 147 mEq/L   Potassium 3.9  3.7 - 5.3 mEq/L   Chloride 109  96 - 112 mEq/L   CO2 17 (*) 19 - 32 mEq/L   Glucose, Bld 197 (*) 70 - 99 mg/dL   BUN 19  6 - 23 mg/dL   Creatinine, Ser 2.83 (*) 0.50 - 1.10 mg/dL   Comment: DELTA CHECK NOTED     REPEATED TO VERIFY   Calcium 7.1 (*) 8.4 - 10.5 mg/dL   GFR calc non Af Amer 20 (*) >90 mL/min   GFR calc Af Amer 23 (*) >90 mL/min   Comment: (NOTE)     The eGFR has been calculated using the CKD EPI equation.     This calculation has not been validated in all clinical situations.     eGFR's persistently <90 mL/min signify possible Chronic Kidney     Disease.  MAGNESIUM     Status: None   Collection Time    01/26/14  5:00 AM      Result Value Ref Range   Magnesium 1.5  1.5 - 2.5 mg/dL  PHOSPHORUS     Status: None   Collection Time     01/26/14  5:00 AM      Result Value Ref Range   Phosphorus 4.0  2.3 - 4.6 mg/dL  CBC     Status: Abnormal   Collection Time    01/26/14  5:00 AM      Result Value Ref Range   WBC 15.3 (*) 4.0 - 10.5 K/uL   RBC 3.01 (*) 3.87 - 5.11 MIL/uL   Hemoglobin 8.4 (*) 12.0 - 15.0 g/dL   HCT 25.8 (*) 36.0 - 46.0 %   MCV 85.7  78.0 - 100.0 fL   MCH 27.9  26.0 - 34.0 pg   MCHC 32.6  30.0 - 36.0 g/dL   RDW 14.9  11.5 - 15.5 %   Platelets 167  150 - 400 K/uL   Comment: REPEATED TO VERIFY  GLUCOSE, CAPILLARY     Status: Abnormal   Collection Time    01/26/14  7:59 AM      Result Value Ref Range   Glucose-Capillary 166 (*) 70 - 99 mg/dL  CULTURE, BLOOD (ROUTINE X 2)     Status: None   Collection Time    01/26/14  9:50 AM      Result Value Ref Range   Specimen Description BLOOD LEFT ANTECUBITAL     Special Requests BOTTLES DRAWN AEROBIC ONLY 3CC     Culture  Setup Time       Value: 01/26/2014 13:59     Performed at [facility]   Culture       Value:        BLOOD CULTURE RECEIVED NO GROWTH TO DATE CULTURE WILL BE HELD FOR 5 DAYS  BEFORE ISSUING A FINAL NEGATIVE REPORT     Performed at [facility]   Report Status PENDING    CULTURE, BLOOD (ROUTINE X 2)     Status: None   Collection Time    01/26/14 10:00 AM      Result Value Ref Range   Specimen Description BLOOD RIGHT HAND     Special Requests BOTTLES DRAWN AEROBIC ONLY 2CC     Culture  Setup Time       Value: 01/26/2014 13:58     Performed at [facility]   Culture       Value:        BLOOD CULTURE RECEIVED NO GROWTH TO DATE CULTURE WILL BE HELD FOR 5 DAYS BEFORE ISSUING A FINAL NEGATIVE REPORT     Performed at [facility]   Report Status PENDING    CBC WITH DIFFERENTIAL     Status: Abnormal   Collection Time    01/26/14 11:15 AM      Result Value Ref Range   WBC 15.5 (*) 4.0 - 10.5 K/uL   RBC 2.95 (*) 3.87 - 5.11 MIL/uL   Hemoglobin 8.4 (*) 12.0 - 15.0 g/dL   HCT 25.2 (*) 36.0 - 46.0 %   MCV  85.4  78.0 - 100.0 fL   MCH 28.5  26.0 - 34.0 pg   MCHC 33.3  30.0 - 36.0 g/dL   RDW 15.0  11.5 - 15.5 %   Platelets 170  150 - 400 K/uL   Neutrophils Relative % 85 (*) 43 - 77 %   Lymphocytes Relative 9 (*) 12 - 46 %   Monocytes Relative 4  3 - 12 %   Eosinophils Relative 2  0 - 5 %   Basophils Relative 0  0 - 1 %   Neutro Abs 13.2 (*) 1.7 - 7.7 K/uL   Lymphs Abs 1.4  0.7 - 4.0 K/uL   Monocytes Absolute 0.6  0.1 - 1.0 K/uL   Eosinophils Absolute 0.3  0.0 - 0.7 K/uL   Basophils Absolute 0.0  0.0 - 0.1 K/uL   RBC Morphology POLYCHROMASIA PRESENT     Comment: BURR CELLS  URINALYSIS, ROUTINE W REFLEX MICROSCOPIC     Status: Abnormal   Collection Time    01/26/14 11:15 AM      Result Value Ref Range   Color, Urine YELLOW  YELLOW   APPearance HAZY (*) CLEAR   Specific Gravity, Urine 1.012  1.005 - 1.030   pH 5.5  5.0 - 8.0   Glucose, UA NEGATIVE  NEGATIVE mg/dL   Hgb urine dipstick SMALL (*) NEGATIVE   Bilirubin Urine NEGATIVE  NEGATIVE   Ketones, ur NEGATIVE  NEGATIVE mg/dL   Protein, ur 30 (*) NEGATIVE mg/dL   Urobilinogen, UA 1.0  0.0 - 1.0 mg/dL   Nitrite NEGATIVE  NEGATIVE   Leukocytes, UA MODERATE (*) NEGATIVE  SODIUM, URINE, RANDOM     Status: None   Collection Time    01/26/14 11:15 AM      Result Value Ref Range   Sodium, Ur 21    OSMOLALITY, URINE     Status: Abnormal   Collection Time    01/26/14 11:15 AM      Result Value Ref Range   Osmolality, Ur 129 (*) 390 - 1090 mOsm/kg   Comment: Performed at [facility] AC[facility]     Status: None   Collection Time    01/26/14 11:15 AM  Result Value Ref Range   Uric Acid, Serum 4.9  2.4 - 7.0 mg/dL  URINE MICROSCOPIC-ADD ON     Status: Abnormal   Collection Time    01/26/14 11:15 AM      Result Value Ref Range   Squamous Epithelial / LPF MANY (*) RARE   WBC, UA 7-10  <3 WBC/hpf   RBC / HPF 3-6  <3 RBC/hpf   Bacteria, UA FEW (*) RARE   Urine-Other MUCOUS PRESENT    GLUCOSE, CAPILLARY     Status:  Abnormal   Collection Time    01/26/14 11:56 AM      Result Value Ref Range   Glucose-Capillary 166 (*) 70 - 99 mg/dL  VANCOMYCIN, TROUGH     Status: Abnormal   Collection Time    01/26/14  1:45 PM      Result Value Ref Range   Vancomycin Tr 55.5 (*) 10.0 - 20.0 ug/mL   Comment: CRITICAL RESULT CALLED TO, READ BACK BY AND VERIFIED WITH:     A.[facility] 01/26/14 1430 BY BSLADE  GLUCOSE, CAPILLARY     Status: Abnormal   Collection Time    01/26/14  3:44 PM      Result Value Ref Range   Glucose-Capillary 176 (*) 70 - 99 mg/dL   Comment 1 Notify RN     Comment 2 Documented in Chart    GLUCOSE, CAPILLARY     Status: Abnormal   Collection Time    01/26/14  8:13 PM      Result Value Ref Range   Glucose-Capillary 340 (*) 70 - 99 mg/dL   Comment 1 Notify RN     Comment 2 Documented in Chart    GLUCOSE, CAPILLARY     Status: Abnormal   Collection Time    01/26/14 11:29 PM      Result Value Ref Range   Glucose-Capillary 219 (*) 70 - 99 mg/dL   Comment 1 Documented in Chart     Comment 2 Notify RN    BASIC METABOLIC PANEL     Status: Abnormal   Collection Time    01/27/14  2:33 AM      Result Value Ref Range   Sodium 140  137 - 147 mEq/L   Potassium 3.8  3.7 - 5.3 mEq/L   Chloride 105  96 - 112 mEq/L   CO2 18 (*) 19 - 32 mEq/L   Glucose, Bld 135 (*) 70 - 99 mg/dL   BUN 24 (*) 6 - 23 mg/dL   Creatinine, Ser 4.02 (*) 0.50 - 1.10 mg/dL   Calcium 7.4 (*) 8.4 - 10.5 mg/dL   GFR calc non Af Amer 13 (*) >90 mL/min   GFR calc Af Amer 15 (*) >90 mL/min   Comment: (NOTE)     The eGFR has been calculated using the CKD EPI equation.     This calculation has not been validated in all clinical situations.     eGFR's persistently <90 mL/min signify possible Chronic Kidney     Disease.  GLUCOSE, CAPILLARY     Status: Abnormal   Collection Time    01/27/14  3:23 AM      Result Value Ref Range   Glucose-Capillary 145 (*) 70 - 99 mg/dL   Comment 1 Documented in Chart     Comment 2 Notify RN       MICRO: 6/13 blood cx 1 of 2 gpc pr : group A strep 6/14 wound cx: gnr and gpc pr  on GS: 6/14 urine cx: enterococcus 6/15 blood cx NGTD  IMAGING: Dg Chest Port 1 View  01/27/2014   CLINICAL DATA:  Pulmonary edema  EXAM: PORTABLE CHEST - 1 VIEW  COMPARISON:  01/26/2014  FINDINGS: Heart, mediastinum and hila are unremarkable. Central vascular congestion and bilateral, centrally predominant, ill-defined interstitial and hazy airspace opacities are stable consistent with pulmonary edema. Small effusions are suspected. No pneumothorax.  IMPRESSION: Persistent pulmonary edema without change from the prior study. No new abnormalities.   Electronically Signed   By: Lajean Manes M.D.   On: 01/27/2014 08:20   Dg Chest Port 1 View  01/26/2014   CLINICAL DATA:  Post extubation  EXAM: PORTABLE CHEST - 1 VIEW  COMPARISON:  01/25/2014  FINDINGS: Endotracheal tube has been removed. Central venous catheter tip has been reposition with the tip now in the proximal SVC. No pneumothorax  Progression of diffuse bilateral airspace disease which may represent pneumonia or edema. No effusion.  IMPRESSION: Central venous catheter tip now in the SVC. Endotracheal tube removed.  Progression of diffuse bilateral airspace disease.   Electronically Signed   By: Franchot Gallo M.D.   On: 01/26/2014 08:24     Assessment/Plan:  38yo F with recent redo decompressive lumbar laminectomy (L5-S1) and posterior lumbar interbody fusion presents with sepsis due to Group A Strep bacteremia and polymicrobial post op wound infection.  - can discontinue vancomycin not needed to cover current infections. Can add clinda x 2-3 days to help with toxin inhibition associated with group a strep infections - enterococcus in urine, likely asymptomatic bacturia, since ua collected at the time only had wbc of 3-6 on microscopy. Additionally, she had been treated with vancomycin to likely treat. - will wait to get picc line on 6/18. Should wait to  see that blood cx from 6/15 have cleared bacteremia - will need 6-8 wks of IV antibiotics. Await identification of GNR to finalize antibiotic recs - will need close observation of her ATN process since she will likely improve and need to have abtx dosage/interval adjusted accordingly.

## 2014-01-27 NOTE — Progress Notes (Signed)
Inpatient Diabetes Program Recommendations  AACE/ADA: New Consensus Statement on Inpatient Glycemic Control (2013)  Target Ranges:  Prepandial:   less than 140 mg/dL      Peak postprandial:   less than 180 mg/dL (1-2 hours)      Critically ill patients:  140 - 180 mg/dL      Results for CELA, NEWCOM (MRN [id number]) as of 01/27/2014 13:39  Ref. Range 01/26/2014 23:29 01/27/2014 03:23 01/27/2014 08:26 01/27/2014 12:07  Glucose-Capillary Latest Range: 70-99 mg/dL 219 (H) 145 (H) 115 (H) 231 (H)    **Patient is not currently getting her Liraglutide (Victoza).  Not on formulary and patient has not brought med to hospital.    MD- Please consider the following:  1. Change Novolog SSI to Moderate scale tid ac + HS 2. Add Novolog Meal Coverage- Novolog 4 units tid with meals   Will follow Wyn Quaker RN, MSN, CDE Diabetes Coordinator Inpatient Diabetes Program Team Pager: [phone] (8a-10p)

## 2014-01-27 NOTE — Progress Notes (Signed)
PULMONARY  / CRITICAL CARE MEDICINE  Name: Marissa [name] MRN: [id number] DOB: 08-Apr-1976    ADMISSION DATE:  01/24/2014 CONSULTATION DATE:  01/25/2014  REFERRING MD :  Earnie Larsson, MD PRIMARY SERVICE: Neurosurgery  CHIEF COMPLAINT:  Back pain  BRIEF PATIENT DESCRIPTION:  38 yo female with recent admission for lumbar disc herniation s/p redo decompressive lunbar laminectomy presenting with severe back pain and found to have lumbar wound infection. Patient now s/p lumbar wound irrigation and debridement 01/25/2014 and we are consulted for management of mechanical ventilation. Patient extubated on 6/14 and improving.  SIGNIFICANT EVENTS / STUDIES:  01/16/2014 - Redo decompressive lumbar laminectomy L5-S1 and posterior lumbar interbody fusion 01/21/2014 - Hospital discharge 01/24/2014 - Admission for lumbar wound infection 01/25/2014 - Re-exploration of lumbar wound with irrigation and debridement 01/25/2014 - Extubation 6/16- worsening renal failure  LINES / TUBES: L IJ CVL 01/25/2014 >>>6/15 L arterial line 01/25/2014 >>>6/14 ETT 01/25/2014 >>>6/14 Foley catheter 01/25/2014 >>> PIV  CULTURES: Wound culture 01/25/2014 >>> mod gram neg rods>>>mod enterobacter, abundant group a strep Blood culture x 2 01/25/2014 >>> gram pos cocci chains>>>group a strep Urine culture 01/25/2014 >>> enteroccus  BC x 2 6/15>>>  ANTIBIOTICS: Vancomycin 01/25/2014 >>>6/15 Zosyn 01/25/2014 >>> Clindamycin 6/16>>>  Subjective:  Group a strep isolated, rising crt, pos 3 liters, pulm edema  VITAL SIGNS: Temp:  [98.8 F (37.1 C)-100.9 F (38.3 C)] 99.1 F (37.3 C) (06/16 0700) Pulse Rate:  [88-128] 99 (06/16 0800) Resp:  [14-32] 20 (06/16 0800) BP: (90-145)/(37-83) 119/53 mmHg (06/16 0800) SpO2:  [92 %-100 %] 100 % (06/16 0800)  HEMODYNAMICS:   VENTILATOR SETTINGS:    INTAKE / OUTPUT: Intake/Output     06/15 0701 - 06/16 0700 06/16 0701 - 06/17 0700   P.O. 840    I.V. (mL/kg) 1610 (13.7) 60 (0.5)   Other 990    IV Piggyback 237.5    Total Intake(mL/kg) 3677.5 (31.2) 60 (0.5)   Urine (mL/kg/hr)     Drains 95 (0)    Total Output 95     Net +3582.5 +60         PHYSICAL EXAMINATION: General:  No acute distress, eating Neuro:  Awake, alert,Generalized weakness. HEENT:  PERRL. Left neck less tender Cardiovascular:  RRR, s1  s2  Lungs: increases coarseness Abdomen:  +BS, soft, non-distended, non-tender.  Musculoskeletal:  No edema noted. Pedal pulses present bilaterally. Skin:  Warm, dry. No rashes noted.  LABS:  CBC Recent Labs     01/25/14  0512  01/26/14  0500  01/26/14  1115  WBC  18.3*  15.3*  15.5*  HGB  9.6*  8.4*  8.4*  HCT  29.7*  25.8*  25.2*  PLT  222  167  170   Coag's Recent Labs     01/25/14  0512  APTT  30  INR  1.38   BMET Recent Labs     01/25/14  0512  01/26/14  0500  01/27/14  0233  NA  141  139  140  K  3.9  3.9  3.8  CL  106  109  105  CO2  19  17*  18*  BUN  10  19  24*  CREATININE  0.95  2.83*  4.02*  GLUCOSE  208*  197*  135*   Electrolytes Recent Labs     01/25/14  0512  01/26/14  0500  01/27/14  0233  CALCIUM  8.3*  7.1*  7.4*  MG   --  1.5   --   PHOS   --   4.0   --    Sepsis Markers No results found for this basename: LACTICACIDVEN, PROCALCITON, O2SATVEN,  in the last 72 hours  ABG Recent Labs     01/25/14  0146  01/25/14  0229  01/25/14  0400  PHART  7.255*  7.226*  7.290*  PCO2ART  34.9*  50.9*  42.0  PO2ART  370.0*  89.0  197.0*   Liver Enzymes Recent Labs     01/25/14  0512  AST  15  ALT  12  ALKPHOS  79  BILITOT  1.0  ALBUMIN  2.0*   Cardiac Enzymes Recent Labs     01/25/14  0512  TROPONINI  <0.30   Glucose Recent Labs     01/26/14  1156  01/26/14  1544  01/26/14  2013  01/26/14  2329  01/27/14  0323  01/27/14  0826  GLUCAP  166*  176*  340*  219*  145*  115*    Imaging Dg Chest Port 1 View  01/27/2014   CLINICAL DATA:  Pulmonary edema  EXAM: PORTABLE CHEST - 1 VIEW  COMPARISON:   01/26/2014  FINDINGS: Heart, mediastinum and hila are unremarkable. Central vascular congestion and bilateral, centrally predominant, ill-defined interstitial and hazy airspace opacities are stable consistent with pulmonary edema. Small effusions are suspected. No pneumothorax.  IMPRESSION: Persistent pulmonary edema without change from the prior study. No new abnormalities.   Electronically Signed   By: Lajean Manes M.D.   On: 01/27/2014 08:20   Dg Chest Port 1 View  01/26/2014   CLINICAL DATA:  Post extubation  EXAM: PORTABLE CHEST - 1 VIEW  COMPARISON:  01/25/2014  FINDINGS: Endotracheal tube has been removed. Central venous catheter tip has been reposition with the tip now in the proximal SVC. No pneumothorax  Progression of diffuse bilateral airspace disease which may represent pneumonia or edema. No effusion.  IMPRESSION: Central venous catheter tip now in the SVC. Endotracheal tube removed.  Progression of diffuse bilateral airspace disease.   Electronically Signed   By: Franchot Gallo M.D.   On: 01/26/2014 08:24   ASSESSMENT / PLAN:  PULMONARY A:  Hypoxic respiratory failure requiring mechanical ventilation post-procedure, resolving pulmonary edema - 6/15  P:   - IS - Upright -lasix -pcxr follow up   CARDIOVASCULAR A:  Hypertension Intermittent hypotension - severe sepsis pulm edema P:  - IVF see renal - may need to hold carvedilol - statin - Tele -NO ACEI -MAp goal 65 with arf -lasix -severe sepsis - lactic acid  RENAL A: Acute Kidney Injury - secondary to hypotension = ATN ( not surprised with ruise crt with group A strep) Pos over 9 liters last 48 hr NON AG acidosis secondary to saline, r/o RTA 4  P: - no indication renal consult at this stage, K wnl, likely ATN -not surprised worsening crt with group A strep -chem to q12h -bicarb for non AG -lasix for hypervolemia, pulm edema -renal US for hydro  GASTROINTESTINAL A:   GERD GI prophylaxis P:   -  Pantoprazole -diet  HEMATOLOGIC A:   DVT prophylaxis Anemia, dilutional  P:  - Holdng anticoagulation -per NS -lasix for dilution, cbc in am   INFECTIOUS A:   Lumbar wound infection - group A strep Blood stream infection - wound source UTI P:   -zosyn will cover all organisms, maintain, dose adjust renals -once we are only treating group A strep ,  should narrow to pcn G if sensitive in future -dc vanc -add clinda short term with some int hypotension for group A strep -will consult ID, duration? -change foley with enterococcus - I would also favor delaying picc line delay anopher 24 hrs, ensure repeat BC neg -will d/w NS, any further look at wound needed given this organism - I don not see a role IVIG  ENDOCRINE A:  Diabetes mellitus   P:   - Liraglutide (Victoza) - SSI  NEUROLOGIC A: Chronic pain P:   - Fentanyl, Oxy-IR PRN - Imitrex PRN - Neurontin, Topamax restarted - Hold home Elavil   Global: group a strep noted, see ID section, concerns, no renal consult needed, lasix needed for pulm edema, would keep her in ICU  Will change to our service given her extensive medical issus now  Lavon Paganini. Titus Mould, MD, [place] Pgr: [facility]

## 2014-01-28 ENCOUNTER — Inpatient Hospital Stay (HOSPITAL_COMMUNITY): Payer: Medicare HMO

## 2014-01-28 DIAGNOSIS — N179 Acute kidney failure, unspecified: Secondary | ICD-10-CM

## 2014-01-28 DIAGNOSIS — J9600 Acute respiratory failure, unspecified whether with hypoxia or hypercapnia: Secondary | ICD-10-CM | POA: Diagnosis not present

## 2014-01-28 DIAGNOSIS — A409 Streptococcal sepsis, unspecified: Secondary | ICD-10-CM | POA: Diagnosis not present

## 2014-01-28 DIAGNOSIS — R82998 Other abnormal findings in urine: Secondary | ICD-10-CM | POA: Diagnosis not present

## 2014-01-28 DIAGNOSIS — T8140XA Infection following a procedure, unspecified, initial encounter: Secondary | ICD-10-CM | POA: Diagnosis not present

## 2014-01-28 DIAGNOSIS — M549 Dorsalgia, unspecified: Secondary | ICD-10-CM | POA: Diagnosis not present

## 2014-01-28 DIAGNOSIS — A419 Sepsis, unspecified organism: Secondary | ICD-10-CM | POA: Diagnosis not present

## 2014-01-28 LAB — LACTIC ACID, PLASMA: Lactic Acid, Venous: 1.2 mmol/L (ref 0.5–2.2)

## 2014-01-28 LAB — BASIC METABOLIC PANEL
BUN: 31 mg/dL — ABNORMAL HIGH (ref 6–23)
CO2: 20 mEq/L (ref 19–32)
Calcium: 8.2 mg/dL — ABNORMAL LOW (ref 8.4–10.5)
Chloride: 104 mEq/L (ref 96–112)
Creatinine, Ser: 5.72 mg/dL — ABNORMAL HIGH (ref 0.50–1.10)
GFR calc non Af Amer: 9 mL/min — ABNORMAL LOW (ref >90)
GFR, EST AFRICAN AMERICAN: 10 mL/min — AB (ref >90)
Glucose, Bld: 175 mg/dL — ABNORMAL HIGH (ref 70–99)
Potassium: 3.7 mEq/L (ref 3.7–5.3)
SODIUM: 142 meq/L (ref 137–147)

## 2014-01-28 LAB — CBC WITH DIFFERENTIAL/PLATELET
Basophils Absolute: 0 10*3/uL (ref 0.0–0.1)
Basophils Relative: 0 % (ref 0–1)
EOS ABS: 0.3 10*3/uL (ref 0.0–0.7)
EOS PCT: 2 % (ref 0–5)
HEMATOCRIT: 25.9 % — AB (ref 36.0–46.0)
HEMOGLOBIN: 8.6 g/dL — AB (ref 12.0–15.0)
LYMPHS ABS: 0.9 10*3/uL (ref 0.7–4.0)
Lymphocytes Relative: 7 % — ABNORMAL LOW (ref 12–46)
MCH: 27.7 pg (ref 26.0–34.0)
MCHC: 33.2 g/dL (ref 30.0–36.0)
MCV: 83.3 fL (ref 78.0–100.0)
MONO ABS: 0.7 10*3/uL (ref 0.1–1.0)
MONOS PCT: 6 % (ref 3–12)
Neutro Abs: 9.6 10*3/uL — ABNORMAL HIGH (ref 1.7–7.7)
Neutrophils Relative %: 84 % — ABNORMAL HIGH (ref 43–77)
Platelets: 225 10*3/uL (ref 150–400)
RBC: 3.11 MIL/uL — AB (ref 3.87–5.11)
RDW: 15 % (ref 11.5–15.5)
WBC: 11.4 10*3/uL — ABNORMAL HIGH (ref 4.0–10.5)

## 2014-01-28 LAB — GLUCOSE, CAPILLARY
GLUCOSE-CAPILLARY: 214 mg/dL — AB (ref 70–99)
GLUCOSE-CAPILLARY: 179 mg/dL — AB (ref 70–99)
Glucose-Capillary: 177 mg/dL — ABNORMAL HIGH (ref 70–99)
Glucose-Capillary: 186 mg/dL — ABNORMAL HIGH (ref 70–99)
Glucose-Capillary: 206 mg/dL — ABNORMAL HIGH (ref 70–99)
Glucose-Capillary: 241 mg/dL — ABNORMAL HIGH (ref 70–99)

## 2014-01-28 MED ORDER — FUROSEMIDE 10 MG/ML IJ SOLN
60.0000 mg | Freq: Every day | INTRAMUSCULAR | Status: DC
  Administered 2014-01-29 – 2014-02-03 (×6): 60 mg via INTRAVENOUS
  Filled 2014-01-28 (×6): qty 6

## 2014-01-28 MED ORDER — CARVEDILOL 12.5 MG PO TABS
12.5000 mg | ORAL_TABLET | Freq: Two times a day (BID) | ORAL | Status: DC
  Administered 2014-01-28 – 2014-02-03 (×12): 12.5 mg via ORAL
  Filled 2014-01-28 (×17): qty 1

## 2014-01-28 NOTE — Progress Notes (Signed)
Overall patient is doing well so a lot of back pain legs continue to improve she is having some diarrhea today  Still some baseline left lower extremity weakness about 4+ out of 5 slowly improving  Dressing is clean dry and intact drain output 50 cc over the last 10 hours  Continue antibiotics and appreciate ID and CCM's assistance in managing this complicated patient. Kidney function remains impaired ,creatinine up over 5 this evening

## 2014-01-28 NOTE — ED Provider Notes (Signed)
Medical screening examination/treatment/procedure(s) were conducted as a shared visit with non-physician practitioner(s) and myself.  I personally evaluated the patient during the encounter.   EKG Interpretation   Date/Time:  Saturday January 24 2014 22:27:57 EDT Ventricular Rate:  120 PR Interval:  129 QRS Duration: 78 QT Interval:  320 QTC Calculation: 452 R Axis:   71 Text Interpretation:  Sinus tachycardia Borderline low voltage, extremity  leads ED PHYSICIAN INTERPRETATION AVAILABLE IN [facility] Confirmed  by TEST, Record ([id number]) on 01/26/2014 12:20:16 PM      Korayma Gaeta is a 38 y.o. female after recent spine surgery here with fever. Febrile in the ED. Tachycardic. Ill appearing. Some purulent drainage from the wound. WBC 19. CXR ? Pneumonia. Given vanc/zosyn. PA called Dr. Annette Stable, who will admit for postop infection.    Wandra Arthurs, MD 01/28/14 2258

## 2014-01-28 NOTE — Progress Notes (Signed)
Creatinine rose to 5.72. Called to E link, Leda Gauze.

## 2014-01-28 NOTE — Progress Notes (Addendum)
[name] for Infectious Disease    Date of Admission:  01/24/2014   Total days of antibiotics 5        Day 5 piptazo           ID: Marissa [name] is a 38 y.o. female with HTN ,DM, obesity s/p redo decompressive lumbar laminectomy (L5-S1) and posterior lumbar interbody fusion on 01/16/14 presents with sepsis from group a strep bacteremia and post-op wound with group a & enterobacter s/p irrigation and debridement of superficial and deep wound space infection.  Active Problems:   Wound infection   Acute respiratory failure    Subjective: afebrile  Medications:  . antiseptic oral rinse  15 mL Mouth Rinse BID  . atorvastatin  80 mg Per Tube QHS  . carvedilol  12.5 mg Oral BID WC  . [START ON 01/29/2014] furosemide  60 mg Intravenous Daily  . gabapentin  600 mg Per Tube Daily  . insulin aspart  0-20 Units Subcutaneous 6 times per day  . Liraglutide  1.8 mg Subcutaneous Daily  . pantoprazole  40 mg Oral Daily  . piperacillin-tazobactam (ZOSYN)  IV  2.25 g Intravenous 4 times per day  . senna  1 tablet Oral BID  . sodium chloride  3 mL Intravenous Q12H  . topiramate  125 mg Per Tube QHS    Objective: Vital signs in last 24 hours: Temp:  [98.8 F (37.1 C)] 98.8 F (37.1 C) (06/17 0339) Pulse Rate:  [87-104] 101 (06/17 1300) Resp:  [13-35] 22 (06/17 1300) BP: (72-156)/(55-121) 143/59 mmHg (06/17 1200) SpO2:  [94 %-100 %] 96 % (06/17 1300) Physical Exam  Constitutional:  oriented to person, place, and time. appears well-developed and well-nourished. No distress.  HENT:  Mouth/Throat: Oropharynx is clear and moist. No oropharyngeal exudate.  Cardiovascular: Normal rate, regular rhythm and normal heart sounds. Exam reveals no gallop and no friction rub.  No murmur heard.  Pulmonary/Chest: Effort normal and breath sounds normal. No respiratory distress.  has no wheezes.  Abdominal: Soft. Bowel sounds are normal.  exhibits no distension. There is no tenderness.    Lymphadenopathy: no cervical adenopathy.  Neurological: alert and oriented to person, place, and time.  Skin: Skin is warm and dry. No rash noted. No erythema.  Psychiatric: a normal mood and affect. His behavior is normal.      Lab Results  Recent Labs  01/26/14 1115  01/27/14 0941 01/27/14 2005 01/28/14 0229  WBC 15.5*  --   --   --  11.4*  HGB 8.4*  --   --   --  8.6*  HCT 25.2*  --   --   --  25.9*  NA  --   < > 142 137  --   K  --   < > 3.9 4.1  --   CL  --   < > 106 102  --   CO2  --   < > 20 17*  --   BUN  --   < > 26* 27*  --   CREATININE  --   < > 4.45* 4.55*  --   < > = values in this interval not displayed. Liver Panel No results found for this basename: PROT, ALBUMIN, AST, ALT, ALKPHOS, BILITOT, BILIDIR, IBILI,  in the last 72 hours Sedimentation Rate No results found for this basename: ESRSEDRATE,  in the last 72 hours C-Reactive Protein No results found for this basename: CRP,  in the last 72 hours  Microbiology: 6/15blood cx ngtd 6/14 urine cx amp S enterococcus 6/14 blood cx group a strep 6/14 wound cx enterobacter & group a Studies/Results: US Renal  01/27/2014   CLINICAL DATA:  Rising BUN and creatinine question hydronephrosis  EXAM: RENAL/URINARY TRACT ULTRASOUND COMPLETE  COMPARISON:  None  FINDINGS: Right Kidney:  Length: 12.3 cm. Normal morphology without mass or hydronephrosis. No shadowing calcifications.  Left Kidney:  Suboptimally visualized question due to body habitus. Unable to accurately measure renal length. An large area of hypo echogenicity is identified in the region of the LEFT kidney which could represent a dilated renal collecting system or a large cyst but is suboptimally localized and delineated.  Bladder:  Decompressed, unable to evaluate.  IMPRESSION: Normal appearing RIGHT kidney.  Unable to assess bladder due to decompressed state.  Inadequate visualization of LEFT kidney to assess length and morphology; an area of hypo echogenicity  is identified in the region of LEFT kidney which could potentially represent a dilated renal collecting system or a large cyst but appearance is nondiagnostic.  Recommend followup non contrast CT to exclude LEFT hydronephrosis.   Electronically Signed   By: Lavonia Dana M.D.   On: 01/27/2014 13:54   Dg Chest Port 1 View  01/28/2014   CLINICAL DATA:  Pulmonary edema.  EXAM: PORTABLE CHEST - 1 VIEW  COMPARISON:  January 27, 2014.  FINDINGS: Cardiomediastinal silhouette appears normal. Bilateral perihilar and basilar opacities are noted which appears slightly worse on the left. These findings are most consistent with pulmonary edema. No pneumothorax is noted. Probable minimal to mild left pleural effusion. Bony thorax is intact.  IMPRESSION: Continued presence of bilateral pulmonary edema which appears to be worse on the left with probable associated minimal to mild left pleural effusion.   Electronically Signed   By: Sabino Dick M.D.   On: 01/28/2014 08:15   Dg Chest Port 1 View  01/27/2014   CLINICAL DATA:  Pulmonary edema  EXAM: PORTABLE CHEST - 1 VIEW  COMPARISON:  01/26/2014  FINDINGS: Heart, mediastinum and hila are unremarkable. Central vascular congestion and bilateral, centrally predominant, ill-defined interstitial and hazy airspace opacities are stable consistent with pulmonary edema. Small effusions are suspected. No pneumothorax.  IMPRESSION: Persistent pulmonary edema without change from the prior study. No new abnormalities.   Electronically Signed   By: Lajean Manes M.D.   On: 01/27/2014 08:20     Assessment/Plan: Group a strep sepsis = appears improving and no further evidence of ongoing infection. Blood c from 6/15 still ngtd. Can place picc line on 6/18 if cx remain negative  Group a strep and enterobacter = isolate is sensitive to ceftriaxone. If continues to do well overnight, Will discontinue piptazo and switch to ceftriaxone 2gm IV daily to start on 6/18  Enterococcus in urine cx=  likely asx bacturia. Already received sufficient abtx for tx  aki = likely from sepsi plus supratherapeutic vanco. Slightly improved from 4.4 to  Cr of 4 today. Recommend checking vanco random level tomorrow  Baxter Flattery [facility] for Infectious Diseases Cell: [phone] Pager: [phone]  01/28/2014, 1:53 PM

## 2014-01-28 NOTE — Progress Notes (Signed)
ANTIBIOTIC CONSULT NOTE - INITIAL  Pharmacy Consult for Zosyn Indication: lumbar wound infection due to enterobacter, group A strep;  Group A strep bacteremia  Allergies  Allergen Reactions  . Ciprofloxacin Swelling  . Azithromycin Itching and Rash  . Food Itching and Rash    "Greek yogurt"    Patient Measurements: Weight 110 kg on 01/18/14 Height 165 cm on 01/16/14 Vital Signs: Temp: 98.8 F (37.1 C) (06/17 0339) Temp src: Axillary (06/17 0339) BP: 131/70 mmHg (06/17 0800) Pulse Rate: 98 (06/17 0800) Labs:  Recent Labs  01/26/14 0500 01/26/14 1115 01/27/14 0233 01/27/14 0941 01/27/14 2005 01/28/14 0229  WBC 15.3* 15.5*  --   --   --  11.4*  HGB 8.4* 8.4*  --   --   --  8.6*  PLT 167 170  --   --   --  225  CREATININE 2.83*  --  4.02* 4.45* 4.55*  --    Estimated Creatinine Clearance: 21.5 ml/min (by C-G formula based on Cr of 4.55). Microbiology: Recent Results (from the past 720 hour(s))  SURGICAL PCR SCREEN     Status: None   Collection Time    01/13/14  1:31 PM      Result Value Ref Range Status   MRSA, PCR NEGATIVE  NEGATIVE Final   Staphylococcus aureus NEGATIVE  NEGATIVE Final   Comment:            The Xpert SA Assay (FDA     approved for NASAL specimens     in patients over 64 years of age),     is one component of     a comprehensive surveillance     program.  Test performance has     been validated by [employer] for patients greater     than or equal to 57 year old.     It is not intended     to diagnose infection nor to     guide or monitor treatment.  MRSA PCR SCREENING     Status: None   Collection Time    01/18/14 11:52 PM      Result Value Ref Range Status   MRSA by PCR NEGATIVE  NEGATIVE Final   Comment:            The GeneXpert MRSA Assay (FDA     approved for NASAL specimens     only), is one component of a     comprehensive MRSA colonization     surveillance program. It is not     intended to diagnose MRSA     infection nor to  guide or     monitor treatment for     MRSA infections.  CULTURE, BLOOD (ROUTINE X 2)     Status: None   Collection Time    01/24/14  9:11 PM      Result Value Ref Range Status   Specimen Description BLOOD LEFT ARM   Final   Special Requests BOTTLES DRAWN AEROBIC AND ANAEROBIC 10CC EACH   Final   Culture  Setup Time     Final   Value: 01/25/2014 00:41     Performed at [facility]   Culture     Final   Value: GROUP A STREP (S.PYOGENES) ISOLATED     Note: CRITICAL RESULT CALLED TO, READ BACK BY AND VERIFIED WITH: JOHN THOMASSON 12/27/13 0850 BY SMITHERSJ     Note: Gram Stain Report Called to,Read Back By and  Verified With: THOMAS LILLEY 01/25/14 @ 8:14PM BY RUSCOE A.     Performed at [facility]   Report Status PENDING   Incomplete  CULTURE, BLOOD (ROUTINE X 2)     Status: None   Collection Time    01/24/14 10:41 PM      Result Value Ref Range Status   Specimen Description BLOOD LEFT ARM   Final   Special Requests BOTTLES DRAWN AEROBIC AND ANAEROBIC [facility] EACH   Final   Culture  Setup Time     Final   Value: 01/25/2014 14:08     Performed at [facility]   Culture     Final   Value:        BLOOD CULTURE RECEIVED NO GROWTH TO DATE CULTURE WILL BE HELD FOR 5 DAYS BEFORE ISSUING A FINAL NEGATIVE REPORT     Performed at [facility]   Report Status PENDING   Incomplete  WOUND CULTURE     Status: None   Collection Time    01/25/14  1:48 AM      Result Value Ref Range Status   Specimen Description WOUND BACK   Final   Special Requests NONE   Final   Gram Stain     Final   Value: ABUNDANT WBC PRESENT,BOTH PMN AND MONONUCLEAR     FEW GRAM POSITIVE COCCI IN PAIRS     RARE GRAM NEGATIVE RODS     Gram Stain Report Called to,Read Back By and Verified With: Gram Stain Report Called to,Read Back By and Verified With: Denyse Amass RN [id number] BY GREEN R Performed at [facility]     Performed at [facility]   Culture     Final   Value:  MODERATE ENTEROBACTER AEROGENES     ABUNDANT GROUP A STREP (S.PYOGENES) ISOLATED     Performed at [facility]   Report Status 01/27/2014 FINAL   Final   Organism ID, Bacteria ENTEROBACTER AEROGENES   Final  GRAM STAIN     Status: None   Collection Time    01/25/14  1:48 AM      Result Value Ref Range Status   Specimen Description WOUND BACK   Final   Special Requests NONE   Final   Gram Stain     Final   Value: ABUNDANT WBC PRESENT,BOTH PMN AND MONONUCLEAR     FEW GRAM POSITIVE COCCI IN PAIRS     RARE GRAM NEGATIVE RODS     Gram Stain Report Called to,Read Back By and Verified With: Denyse Amass RN [id number] GREEN R   Report Status 01/25/2014 FINAL   Final  MRSA PCR SCREENING     Status: None   Collection Time    01/25/14  3:05 AM      Result Value Ref Range Status   MRSA by PCR NEGATIVE  NEGATIVE Final   Comment:            The GeneXpert MRSA Assay (FDA     approved for NASAL specimens     only), is one component of a     comprehensive MRSA colonization     surveillance program. It is not     intended to diagnose MRSA     infection nor to guide or     monitor treatment for     MRSA infections.  URINE CULTURE     Status: None   Collection Time    01/25/14  3:30  AM      Result Value Ref Range Status   Specimen Description URINE, CATHETERIZED   Final   Special Requests NONE   Final   Culture  Setup Time     Final   Value: 01/25/2014 14:15     Performed at [facility]     Final   Value: >=100,000 COLONIES/ML     Performed at [facility]   Culture     Final   Value: ENTEROCOCCUS SPECIES     Performed at [facility]   Report Status 01/27/2014 FINAL   Final   Organism ID, Bacteria ENTEROCOCCUS SPECIES   Final  CULTURE, BLOOD (ROUTINE X 2)     Status: None   Collection Time    01/26/14  9:50 AM      Result Value Ref Range Status   Specimen Description BLOOD LEFT ANTECUBITAL   Final   Special Requests BOTTLES DRAWN  AEROBIC ONLY 3CC   Final   Culture  Setup Time     Final   Value: 01/26/2014 13:59     Performed at [facility]   Culture     Final   Value:        BLOOD CULTURE RECEIVED NO GROWTH TO DATE CULTURE WILL BE HELD FOR 5 DAYS BEFORE ISSUING A FINAL NEGATIVE REPORT     Performed at [facility]   Report Status PENDING   Incomplete  CULTURE, BLOOD (ROUTINE X 2)     Status: None   Collection Time    01/26/14 10:00 AM      Result Value Ref Range Status   Specimen Description BLOOD RIGHT HAND   Final   Special Requests BOTTLES DRAWN AEROBIC ONLY 2CC   Final   Culture  Setup Time     Final   Value: 01/26/2014 13:58     Performed at [facility]   Culture     Final   Value:        BLOOD CULTURE RECEIVED NO GROWTH TO DATE CULTURE WILL BE HELD FOR 5 DAYS BEFORE ISSUING A FINAL NEGATIVE REPORT     Performed at [facility]   Report Status PENDING   Incomplete    Assessment: 28 YOF s/p lumbar fusion surgery on 01/16/14 with lumbar wound infection.  Wound culture with enterobacter and group A strep.  Blood cultures growing group A strep.    Vanc 6/13 >>6/16 6/15 Vanco trough=55.5 Zosyn 6/13 >> clinda 6/16>>  Plan:  1. Continue Zosyn 2.25g IV q6h.  Expect therapy to be narrowed in light of culture results.  Heide Guile, PharmD, BCPS Clinical Pharmacist Pager [phone]   01/28/2014,9:38 AM

## 2014-01-28 NOTE — Progress Notes (Signed)
PULMONARY  / CRITICAL CARE MEDICINE  Name: Marissa [name] MRN: [id number] DOB: 21-Aug-1975    ADMISSION DATE:  01/24/2014 CONSULTATION DATE:  01/25/2014  REFERRING MD :  Earnie Larsson, MD PRIMARY SERVICE: Neurosurgery  CHIEF COMPLAINT:  Back pain  BRIEF PATIENT DESCRIPTION:  38 yo female with recent admission for lumbar disc herniation s/p redo decompressive lunbar laminectomy presenting with severe back pain and found to have lumbar wound infection. Patient now s/p lumbar wound irrigation and debridement 01/25/2014 and we are consulted for management of mechanical ventilation. Patient extubated on 6/14.  Plan for transfer to SDU as pressures are stable.  SIGNIFICANT EVENTS / STUDIES:  01/16/2014 - Redo decompressive lumbar laminectomy L5-S1 and posterior lumbar interbody fusion 01/21/2014 - Hospital discharge 01/24/2014 - Admission for lumbar wound infection 01/25/2014 - Re-exploration of lumbar wound with irrigation and debridement 01/25/2014 - Extubation 01/27/2014 - Worsening renal failure 6/17- plat pf crt  LINES / TUBES: L IJ CVL 01/25/2014 >>>6/15 L arterial line 01/25/2014 >>>6/14 ETT 01/25/2014 >>>6/14 Foley catheter 01/25/2014 >>> PIV  CULTURES: Wound culture 01/25/2014 >>> mod gram neg rods>>>mod enterobacter, abundant group a strep Blood culture x 2 01/25/2014 >>> gram pos cocci chains>>>group a strep Urine culture 01/25/2014 >>> enteroccus (sens amp)  BC x 2 6/15>>>ngtd  ANTIBIOTICS: Vancomycin 01/25/2014 >>>6/15 Zosyn 01/25/2014 >>> Clindamycin 6/16>>>6/17  Subjective:  Group a strep isolated, rising crt, - fluid balance, pulm edema  VITAL SIGNS: Temp:  [98.5 F (36.9 C)-98.8 F (37.1 C)] 98.8 F (37.1 C) (06/17 0339) Pulse Rate:  [87-99] 99 (06/17 0740) Resp:  [13-36] 22 (06/17 0740) BP: (72-140)/(50-121) 124/63 mmHg (06/17 0740) SpO2:  [94 %-100 %] 100 % (06/17 0740)  HEMODYNAMICS:   VENTILATOR SETTINGS:   INTAKE / OUTPUT: Intake/Output     06/16 0701 - 06/17  0700 06/17 0701 - 06/18 0700   P.O. 675    I.V. (mL/kg) 1200 (10.2)    Other     IV Piggyback 400    Total Intake(mL/kg) 2275 (19.3)    Urine (mL/kg/hr) 1670 (0.6) 100 (1.2)   Drains 100 (0)    Total Output 1770 100   Net +505 -100         PHYSICAL EXAMINATION: General: Resting comfortably in bed, NAD. Neuro:  Awake, alert and cooperative with exam. Oriented x 4.  Generalized weakness.  Moves all extremities.  Occasional tremulousness noted by nurse. HEENT:  PERRL. Oral mucosa pink and moist.  L neck (former CVL site) less tender and w/o erythema or fluctuance. Cardiovascular:  RRR, S1 S2.  No peripheral edema noted. Lungs: Resps even and unlabored. Breath sounds distant, slightly coarse. Abdomen:  +BS, soft, non-distended, non-tender.  Musculoskeletal:  No edema noted. Pedal pulses present bilaterally. Skin:  Warm, dry. No rashes noted. Wound to lumbar region.  LABS:  CBC Recent Labs     01/26/14  0500  01/26/14  1115  01/28/14  0229  WBC  15.3*  15.5*  11.4*  HGB  8.4*  8.4*  8.6*  HCT  25.8*  25.2*  25.9*  PLT  167  170  225   Coag's No results found for this basename: APTT, INR,  in the last 72 hours BMET Recent Labs     01/27/14  0233  01/27/14  0941  01/27/14  2005  NA  140  142  137  K  3.8  3.9  4.1  CL  105  106  102  CO2  18*  20  17*  BUN  24*  26*  27*  CREATININE  4.02*  4.45*  4.55*  GLUCOSE  135*  156*  254*   Electrolytes Recent Labs     01/26/14  0500  01/27/14  0233  01/27/14  0941  01/27/14  2005  CALCIUM  7.1*  7.4*  7.8*  7.6*  MG  1.5   --    --    --   PHOS  4.0   --    --    --    Sepsis Markers No results found for this basename: LACTICACIDVEN, PROCALCITON, O2SATVEN,  in the last 72 hours  ABG No results found for this basename: PHART, PCO2ART, PO2ART,  in the last 72 hours Liver Enzymes No results found for this basename: AST, ALT, ALKPHOS, BILITOT, ALBUMIN,  in the last 72 hours Cardiac Enzymes No results found for this  basename: TROPONINI, PROBNP,  in the last 72 hours Glucose Recent Labs     01/27/14  0826  01/27/14  1207  01/27/14  1549  01/27/14  1938  01/28/14  0006  01/28/14  0337  GLUCAP  115*  231*  199*  254*  206*  214*    Imaging US Renal  01/27/2014   CLINICAL DATA:  Rising BUN and creatinine question hydronephrosis  EXAM: RENAL/URINARY TRACT ULTRASOUND COMPLETE  COMPARISON:  None  FINDINGS: Right Kidney:  Length: 12.3 cm. Normal morphology without mass or hydronephrosis. No shadowing calcifications.  Left Kidney:  Suboptimally visualized question due to body habitus. Unable to accurately measure renal length. An large area of hypo echogenicity is identified in the region of the LEFT kidney which could represent a dilated renal collecting system or a large cyst but is suboptimally localized and delineated.  Bladder:  Decompressed, unable to evaluate.  IMPRESSION: Normal appearing RIGHT kidney.  Unable to assess bladder due to decompressed state.  Inadequate visualization of LEFT kidney to assess length and morphology; an area of hypo echogenicity is identified in the region of LEFT kidney which could potentially represent a dilated renal collecting system or a large cyst but appearance is nondiagnostic.  Recommend followup non contrast CT to exclude LEFT hydronephrosis.   Electronically Signed   By: Lavonia Dana M.D.   On: 01/27/2014 13:54   Dg Chest Port 1 View  01/27/2014   CLINICAL DATA:  Pulmonary edema  EXAM: PORTABLE CHEST - 1 VIEW  COMPARISON:  01/26/2014  FINDINGS: Heart, mediastinum and hila are unremarkable. Central vascular congestion and bilateral, centrally predominant, ill-defined interstitial and hazy airspace opacities are stable consistent with pulmonary edema. Small effusions are suspected. No pneumothorax.  IMPRESSION: Persistent pulmonary edema without change from the prior study. No new abnormalities.   Electronically Signed   By: Lajean Manes M.D.   On: 01/27/2014 08:20    ASSESSMENT / PLAN:  PULMONARY A:  Hypoxic respiratory failure requiring mechanical ventilation post-procedure, resolving Pulmonary edema - 6/15  P:   - IS - Lasix, pulm edema on CXR but on low O2, plat of crt- lower lasix - PCXR in am, follow poss developing effusion, may need to [place] left base  CARDIOVASCULAR A:  Hypertension Intermittent hypotension - severe sepsis Pulm edema  P:  - IVF, see renal - Tele - Lasix daily - Lactic acid clearing - Restart carvedilol - Continue statin  RENAL A: Acute Kidney Injury - secondary to hypotension = ATN (rise in cr likely d/t Group A strep) Balance + 4L last 48h Non-AG acidosis secondary to saline, r/o  RTA [place] of crt a positive  P: - BMet to daily - Bicarb for non-AG acidosis, until re assess non ag portion of chem - Lasix for hypervolemia, pulm edema, reduce - Renal US>>>normal R kidney, unable to visualize L kidney - Consider non-contrast CT to exclude L hydronephrosis if worsen crt further, low suspicion   GASTROINTESTINAL A:   GERD GI prophylaxis P:   - Pantoprazole - Regular diet  HEMATOLOGIC A:   DVT prophylaxis Anemia, dilutional  P:  - Holdng anticoagulation -per NS - Lasix for dilution - Rpt CBC am  INFECTIOUS A:   Lumbar wound infection - group A strep Blood stream infection - wound source UTI P:  - ID consulted, appreciate recs - D/C Clinda, BP staballizing = no toxins - Zosyn will cover all organisms, maintain, dose adjust renals - Enterococcus in urine likely asymptomatic bacturia, already had vanc - per ID - Will need 6-8 wks IV abx - Delay PICC insertion to 6/18, when [facility] 6/15 clear - Close obs of ATN for renal dose adj  ENDOCRINE A:  Diabetes mellitus   P:   - Liraglutide (Victoza), ordered. Pt says this makes her nauseous. - SSI  NEUROLOGIC A: Chronic pain P:   - Fentanyl, Oxy-IR PRN - Imitrex PRN - Neurontin - Consider holding Topamax per pharmacy - poss met acidosis  if we do not improve on bicarb, pending chem this am  - Hold home Elavil  Plan for tx to SDU., if OK by NS  Lowella Dell. Reese, PA-S   I have fully examined this patient and agree with above findings.     Lavon Paganini. Titus Mould, MD, [place] Pgr: [facility]

## 2014-01-29 ENCOUNTER — Encounter (HOSPITAL_COMMUNITY): Payer: Self-pay | Admitting: Neurosurgery

## 2014-01-29 ENCOUNTER — Inpatient Hospital Stay (HOSPITAL_COMMUNITY): Payer: Medicare HMO

## 2014-01-29 DIAGNOSIS — N179 Acute kidney failure, unspecified: Secondary | ICD-10-CM | POA: Diagnosis not present

## 2014-01-29 DIAGNOSIS — J9600 Acute respiratory failure, unspecified whether with hypoxia or hypercapnia: Secondary | ICD-10-CM | POA: Diagnosis not present

## 2014-01-29 DIAGNOSIS — T8140XA Infection following a procedure, unspecified, initial encounter: Secondary | ICD-10-CM | POA: Diagnosis not present

## 2014-01-29 DIAGNOSIS — A409 Streptococcal sepsis, unspecified: Secondary | ICD-10-CM | POA: Diagnosis not present

## 2014-01-29 DIAGNOSIS — B950 Streptococcus, group A, as the cause of diseases classified elsewhere: Secondary | ICD-10-CM | POA: Diagnosis not present

## 2014-01-29 LAB — TYPE AND SCREEN
ABO/RH(D): B POS
Antibody Screen: NEGATIVE
UNIT DIVISION: 0
Unit division: 0
Unit division: 0
Unit division: 0

## 2014-01-29 LAB — BASIC METABOLIC PANEL
BUN: 32 mg/dL — AB (ref 6–23)
CO2: 21 mEq/L (ref 19–32)
CREATININE: 6.09 mg/dL — AB (ref 0.50–1.10)
Calcium: 8.3 mg/dL — ABNORMAL LOW (ref 8.4–10.5)
Chloride: 104 mEq/L (ref 96–112)
GFR calc Af Amer: 9 mL/min — ABNORMAL LOW (ref >90)
GFR, EST NON AFRICAN AMERICAN: 8 mL/min — AB (ref >90)
GLUCOSE: 201 mg/dL — AB (ref 70–99)
POTASSIUM: 3.7 meq/L (ref 3.7–5.3)
Sodium: 143 mEq/L (ref 137–147)

## 2014-01-29 LAB — GLUCOSE, CAPILLARY
GLUCOSE-CAPILLARY: 171 mg/dL — AB (ref 70–99)
GLUCOSE-CAPILLARY: 142 mg/dL — AB (ref 70–99)
GLUCOSE-CAPILLARY: 147 mg/dL — AB (ref 70–99)
Glucose-Capillary: 191 mg/dL — ABNORMAL HIGH (ref 70–99)
Glucose-Capillary: 123 mg/dL — ABNORMAL HIGH (ref 70–99)
Glucose-Capillary: 195 mg/dL — ABNORMAL HIGH (ref 70–99)

## 2014-01-29 LAB — CBC
HEMATOCRIT: 25.3 % — AB (ref 36.0–46.0)
HEMOGLOBIN: 8.4 g/dL — AB (ref 12.0–15.0)
MCH: 27.5 pg (ref 26.0–34.0)
MCHC: 33.2 g/dL (ref 30.0–36.0)
MCV: 83 fL (ref 78.0–100.0)
Platelets: 223 10*3/uL (ref 150–400)
RBC: 3.05 MIL/uL — ABNORMAL LOW (ref 3.87–5.11)
RDW: 14.7 % (ref 11.5–15.5)
WBC: 10.3 10*3/uL (ref 4.0–10.5)

## 2014-01-29 LAB — CLOSTRIDIUM DIFFICILE BY PCR: Toxigenic C. Difficile by PCR: NEGATIVE

## 2014-01-29 MED ORDER — DEXTROSE 5 % IV SOLN
2.0000 g | INTRAVENOUS | Status: DC
  Administered 2014-01-29 – 2014-02-05 (×8): 2 g via INTRAVENOUS
  Filled 2014-01-29 (×9): qty 2

## 2014-01-29 MED ORDER — GABAPENTIN 600 MG PO TABS: 300.0000 mg | ORAL_TABLET | Freq: Every day | ORAL | Status: DC

## 2014-01-29 NOTE — Progress Notes (Signed)
PULMONARY  / CRITICAL CARE MEDICINE  Name: Marissa [name] MRN: [id number] DOB: 1975/11/19    ADMISSION DATE:  01/24/2014 CONSULTATION DATE:  01/25/2014  REFERRING MD :  Earnie Larsson, MD PRIMARY SERVICE: Neurosurgery  CHIEF COMPLAINT:  Back pain  BRIEF PATIENT DESCRIPTION:  38 yo female with recent admission for lumbar disc herniation s/p redo decompressive lunbar laminectomy presenting with severe back pain and found to have lumbar wound infection. Patient now s/p lumbar wound irrigation and debridement 01/25/2014 and we are consulted for management of mechanical ventilation. Patient extubated on 6/14.  Plan for transfer to SDU as pressures are stable.  SIGNIFICANT EVENTS / STUDIES:  01/16/2014 - Redo decompressive lumbar laminectomy L5-S1 and posterior lumbar interbody fusion 01/21/2014 - Hospital discharge 01/24/2014 - Admission for lumbar wound infection 01/25/2014 - Re-exploration of lumbar wound with irrigation and debridement 01/25/2014 - Extubation 01/27/2014 - Worsening renal failure 01/28/2014 - Plateau of crt 01/30/2012 - Plan for PICC placement today  LINES / TUBES: L IJ CVL 01/25/2014 >>>6/15 L arterial line 01/25/2014 >>>6/14 ETT 01/25/2014 >>>6/14 Foley catheter 01/25/2014 >>> PIV  CULTURES: Wound culture 01/25/2014 >>> mod gram neg rods>>>mod enterobacter, abundant group a strep (cefazolin res) Blood culture x 2 01/25/2014 >>> gram pos cocci chains>>>group a strep Urine culture 01/25/2014 >>> enteroccus (sens amp)  BC x 2 6/15>>>ngtd  ANTIBIOTICS: Vancomycin 01/25/2014 >>>6/15 Zosyn 01/25/2014 >>> Clindamycin 6/16>>>6/17  SUBJECTIVE: Patient has been having diarrhea since last pm.  Stool sample sent for C. Diff, not yet resulted.  Pos fluid balance today but CXR improved since receiving Lasix.  Patient is teary today, expressing frustration with new tremulousness of her hands.  States that she is having pain in her back and legs.  Denies any difficulty breathing or SOB, CP, or  abdominal pain.  Denies feeling feverish.  Cr continues to rise very minimally but has relatively plateau ed and excellent output.  No clear indication for dialysis at this time as she is having good urine output; renal will assess. Plan for PICC placement today.  VITAL SIGNS: Temp:  [97.9 F (36.6 C)-99 F (37.2 C)] 99 F (37.2 C) (06/18 0726) Pulse Rate:  [81-104] 93 (06/18 0726) Resp:  [14-26] 23 (06/18 0726) BP: (126-156)/(59-102) 128/77 mmHg (06/18 0726) SpO2:  [95 %-99 %] 95 % (06/18 0726)  HEMODYNAMICS:   VENTILATOR SETTINGS:   INTAKE / OUTPUT: Intake/Output     06/17 0701 - 06/18 0700 06/18 0701 - 06/19 0700   P.O. 240    I.V. (mL/kg) 1130 (9.6) 60 (0.5)   IV Piggyback 200    Total Intake(mL/kg) 1570 (13.3) 60 (0.5)   Urine (mL/kg/hr) 1535 (0.5)    Drains 105 (0)    Total Output 1640     Net -70 +60         PHYSICAL EXAMINATION: General: Resting comfortably in bed, tearful, NAD. Neuro:  Awake, alert and cooperative with exam. Oriented x 4.  Generalized weakness.  Moves all extremities. Tremulousness of bilateral upper extremities. HEENT:  PERRL. Oral mucosa pink and moist.  L neck (former CVL site) non-tender and w/o erythema or fluctuance. Cardiovascular:  RRR, S1 S2.  No peripheral edema noted. Lungs: Resps even and unlabored. Breath sounds distant, slightly coarse.  Abdomen:  +BS, soft, non-distended, non-tender.  Musculoskeletal:  No edema noted. Pedal pulses present bilaterally. Skin:  Warm, dry. No rashes noted. Wound to lumbar region.  LABS:  CBC Recent Labs     01/26/14  1115  01/28/14  0229  01/29/14  0235  WBC  15.5*  11.4*  10.3  HGB  8.4*  8.6*  8.4*  HCT  25.2*  25.9*  25.3*  PLT  170  225  223   Coag's No results found for this basename: APTT, INR,  in the last 72 hours BMET Recent Labs     01/27/14  2005  01/28/14  1600  01/29/14  0235  NA  137  142  143  K  4.1  3.7  3.7  CL  102  104  104  CO2  17*  20  21  BUN  27*  31*  32*   CREATININE  4.55*  5.72*  6.09*  GLUCOSE  254*  175*  201*   Electrolytes Recent Labs     01/27/14  2005  01/28/14  1600  01/29/14  0235  CALCIUM  7.6*  8.2*  8.3*   Sepsis Markers No results found for this basename: LACTICACIDVEN, PROCALCITON, O2SATVEN,  in the last 72 hours  ABG No results found for this basename: PHART, PCO2ART, PO2ART,  in the last 72 hours Liver Enzymes No results found for this basename: AST, ALT, ALKPHOS, BILITOT, ALBUMIN,  in the last 72 hours Cardiac Enzymes No results found for this basename: TROPONINI, PROBNP,  in the last 72 hours Glucose Recent Labs     01/28/14  1242  01/28/14  1547  01/28/14  1959  01/29/14  0101  01/29/14  0429  01/29/14  0731  GLUCAP  241*  177*  186*  191*  171*  142*    Imaging US Renal  01/27/2014   CLINICAL DATA:  Rising BUN and creatinine question hydronephrosis  EXAM: RENAL/URINARY TRACT ULTRASOUND COMPLETE  COMPARISON:  None  FINDINGS: Right Kidney:  Length: 12.3 cm. Normal morphology without mass or hydronephrosis. No shadowing calcifications.  Left Kidney:  Suboptimally visualized question due to body habitus. Unable to accurately measure renal length. An large area of hypo echogenicity is identified in the region of the LEFT kidney which could represent a dilated renal collecting system or a large cyst but is suboptimally localized and delineated.  Bladder:  Decompressed, unable to evaluate.  IMPRESSION: Normal appearing RIGHT kidney.  Unable to assess bladder due to decompressed state.  Inadequate visualization of LEFT kidney to assess length and morphology; an area of hypo echogenicity is identified in the region of LEFT kidney which could potentially represent a dilated renal collecting system or a large cyst but appearance is nondiagnostic.  Recommend followup non contrast CT to exclude LEFT hydronephrosis.   Electronically Signed   By: Lavonia Dana M.D.   On: 01/27/2014 13:54   Dg Chest Port 1 View  01/29/2014    CLINICAL DATA:  Pulmonary edema  EXAM: PORTABLE CHEST - 1 VIEW  COMPARISON:  01/28/2014  FINDINGS: Bilateral airspace disease shows mild interval improvement. Improvement in left lower lobe atelectasis. Small left effusion is present.  IMPRESSION: Mild improvement in pulmonary edema. Improvement in left lower lobe atelectasis. Small left effusion is present.   Electronically Signed   By: Franchot Gallo M.D.   On: 01/29/2014 07:59   Dg Chest Port 1 View  01/28/2014   CLINICAL DATA:  Pulmonary edema.  EXAM: PORTABLE CHEST - 1 VIEW  COMPARISON:  January 27, 2014.  FINDINGS: Cardiomediastinal silhouette appears normal. Bilateral perihilar and basilar opacities are noted which appears slightly worse on the left. These findings are most consistent with pulmonary edema. No pneumothorax is noted. Probable minimal to mild  left pleural effusion. Bony thorax is intact.  IMPRESSION: Continued presence of bilateral pulmonary edema which appears to be worse on the left with probable associated minimal to mild left pleural effusion.   Electronically Signed   By: Sabino Dick M.D.   On: 01/28/2014 08:15   ASSESSMENT / PLAN:  PULMONARY A:  Hypoxic respiratory failure requiring mechanical ventilation post-procedure, resolving Pulmonary edema, improving on pcxr 6/18  P:   - IS - Lasix daily at lower dosage, may need to dc - PCXR with improvement of pulm edema, ct Lasix and rpt CXR  CARDIOVASCULAR A:  Hypertension Intermittent hypotension - severe sepsis Pulm edema>> improving  P:  - IVF, see renal - Tele - Lasix daily - Continue Coreg - Continue statin  RENAL A: Acute Kidney Injury - secondary to hypotension = ATN (rise in cr likely d/t Group A strep) Balance + 4L last 48h Non-AG acidosis secondary to saline >>> resolving Plateau of crt a positive  P: - BMet to daily - D/C bicarb gtt - Lasix for hypervolemia, pulm edema, may need to hold - Renal US>>>normal R kidney, unable to visualize L  kidney - Consider non-contrast CT to exclude L hydronephrosis  GASTROINTESTINAL A:   GERD GI prophylaxis P:   - Pantoprazole - Regular diet  HEMATOLOGIC A:   DVT prophylaxis Anemia, dilutional  P:  - Holdng anticoagulation - per NS - Rpt CBC am  INFECTIOUS A:   Lumbar wound infection - group A strep Blood stream infection - wound source UTI P:  - ID consulted, appreciate recs - D/C Zosyn - Begin IV ceftriaxone per ID, as GAS/enterobacter sens - Pharm consult for renal dosing   - Enterococcus in urine likely asymptomatic bacturia, already had vanc - per ID - Will need 6-8 wks IV abx - Plan for PICC placement today (6/18) - Close obs of ATN for renal dose adj  ENDOCRINE A:  Diabetes mellitus   P:   - Liraglutide (Victoza), ordered. Pt says this makes her nauseous. - SSI  NEUROLOGIC A: Chronic pain P:   - Fentanyl, Oxy-IR PRN - Imitrex PRN - Neurontin - Hold home Elavil  Lowella Dell. Ayesha Rumpf, PA-S To triad, remain SDU Consulted renal  Lavon Paganini. Titus Mould, MD, [place]ly Pgr: [facility]

## 2014-01-29 NOTE — Progress Notes (Signed)
PT Cancellation Note  Patient Details Name: Marissa [name] MRN: [id number] DOB: September 27, 1975   Cancelled Treatment:    Reason Eval/Treat Not Completed: Patient at procedure or test/unavailable.   Duncan Dull 01/29/2014, 4:15 PM Alben Deeds, [place] DPT  [phone]

## 2014-01-29 NOTE — Progress Notes (Signed)
Utilization review completed.  

## 2014-01-29 NOTE — Consult Note (Signed)
[facility] Renal Consultation Note  Requesting MD: Titus Mould Indication for Consultation: AKI  HPI: Marissa [name] is a 38 y.o. female with past medical history significant for obesity and hypertension as well as diabetes. She is noted to have normal renal function at baseline. She underwent an L5-S 1 decompression and fusion in early June.  She presented back to the hospital about a week later with fevers and increased pain to her surgical site. She was found to have a wound infection and also a group a strep bacteremia. Associated with this she had hypotension. Her creatinine was noted to be 0.95 on June 14. Then starting on June 15 her creatinine began to rise steadily. On June 15 creatinine was noted to be 2.8 and then on June 16 at 4.2.  She had a period of oliguria but now her urine output has picked up. On June 17, creatinine was 5.7 and is noted to be 6.09 today. As noted previously, she is making better urine- total of 1500 yesterday and 400 so far today.  Her potassium is fine and bicarbonate is good. She received a lot of fluid early on in the hospitalization so she is volume overloaded but not respiratory compromised. She is alert, pleasant and denies any uremic symptoms.   Creatinine, Ser  Date/Time Value Ref Range Status  01/29/2014  2:35 AM 6.09* 0.50 - 1.10 mg/dL Final  01/28/2014  4:00 PM 5.72* 0.50 - 1.10 mg/dL Final  01/27/2014  8:05 PM 4.55* 0.50 - 1.10 mg/dL Final  01/27/2014  9:41 AM 4.45* 0.50 - 1.10 mg/dL Final  01/27/2014  2:33 AM 4.02* 0.50 - 1.10 mg/dL Final  01/26/2014  5:00 AM 2.83* 0.50 - 1.10 mg/dL Final     DELTA CHECK NOTED     REPEATED TO VERIFY  01/25/2014  5:12 AM 0.95  0.50 - 1.10 mg/dL Final  01/24/2014  9:11 PM 0.91  0.50 - 1.10 mg/dL Final  01/18/2014 10:45 PM 0.73  0.50 - 1.10 mg/dL Final  01/13/2014  1:32 PM 0.65  0.50 - 1.10 mg/dL Final  10/31/2013  2:46 PM 1.09  0.50 - 1.10 mg/dL Final  10/20/2013  1:18 PM 0.69  0.50 - 1.10 mg/dL Final  05/09/2013   6:04 PM 0.61  0.50 - 1.10 mg/dL Final  05/06/2013 11:00 AM 0.57  0.50 - 1.10 mg/dL Final     PMHx:   Past Medical History  Diagnosis Date  . Hypertension   . Coronary artery disease   . Neuropathy   . Headache(784.0)   . Arthritis   . Vision loss     due to diabetes  . Myocardial infarction 2006    "due to medication"; no evidence of ischemia or infarction by nuclear stress test '11  . Dysrhythmia     tachycardia  . Diabetes mellitus without complication     fasting cbg 50-140s  . GERD (gastroesophageal reflux disease)     otc meds  . Migraines     once/month maybe - can last up to two weeks  . Hyperlipemia     Past Surgical History  Procedure Laterality Date  . Back surgery  2011    Lumbar   . Cervical fusion  2006  . Salpingoophorectomy Bilateral     1 1997. 2nd 2001  . Cardiac catheterization  07/18/2004    50-60% mid LAD, minor luminal irregularities RCA, normal LM and CX, EF 50-55% [facility])  . Lumbar laminectomy/decompression microdiscectomy Right 05/07/2013    Procedure: Right  Lumbar one-two laminectomy;  Surgeon: Elaina Hoops, MD;  Location: [facility];  Service: Neurosurgery;  Laterality: Right;  . Carpel tunnel Bilateral   . Lumbar laminectomy/decompression microdiscectomy Left 10/29/2013    Procedure: Left Lumbar five-Sacral one Laminectomy;  Surgeon: Elaina Hoops, MD;  Location: [place] [facility];  Service: Neurosurgery;  Laterality: Left;  . Lumbar wound debridement N/A 01/25/2014    Procedure: LUMBAR WOUND DEBRIDEMENT;  Surgeon: Charlie Pitter, MD;  Location: [place] [facility];  Service: Neurosurgery;  Laterality: N/A;    Family Hx: History reviewed. No pertinent family history.  Social History:  reports that she has never smoked. She does not have any smokeless tobacco history on file. She reports that she does not drink alcohol or use illicit drugs.  Allergies:  Allergies  Allergen Reactions  . Ciprofloxacin Swelling  . Azithromycin Itching and  Rash  . Food Itching and Rash    "Greek yogurt"    Medications: Prior to Admission medications   Medication Sig Start Date End Date Taking? Authorizing Provider  amitriptyline (ELAVIL) 100 MG tablet Take 100 mg by mouth at bedtime.    Yes Historical Provider, MD  atorvastatin (LIPITOR) 80 MG tablet Take 80 mg by mouth at bedtime.    Yes Historical Provider, MD  carvedilol (COREG) 6.25 MG tablet Take 12.5 mg by mouth 2 (two) times daily with a meal.    Yes Historical Provider, MD  gabapentin (NEURONTIN) 300 MG capsule Take 600 mg by mouth 3 (three) times daily.    Yes Historical Provider, MD  insulin aspart (NOVOLOG FLEXPEN) 100 UNIT/ML SOPN FlexPen Inject 10-20 Units into the skin 3 (three) times daily with meals. Sliding scale   Yes Historical Provider, MD  Liraglutide (VICTOZA) 18 MG/3ML SOPN Inject 1.8 mg into the skin daily.   Yes Historical Provider, MD  nitroGLYCERIN (NITROSTAT) 0.4 MG SL tablet Place 0.4 mg under the tongue every 5 (five) minutes as needed for chest pain.   Yes Historical Provider, MD  oxycodone (ROXICODONE) 30 MG immediate release tablet Take 1 tablet (30 mg total) by mouth every 6 (six) hours as needed for pain. 01/21/14  Yes Eustace Moore, MD  SUMAtriptan (IMITREX) 50 MG tablet Take 50 mg by mouth every 2 (two) hours as needed for migraine or headache. May repeat in 2 hours if headache persists or recurs.   Yes Historical Provider, MD  tiZANidine (ZANAFLEX) 4 MG tablet Take 1 tablet (4 mg total) by mouth 3 (three) times daily. 01/21/14  Yes Eustace Moore, MD  topiramate (TOPAMAX) 100 MG tablet Take 100 mg by mouth at bedtime. Take with 25 mg tablet for a 125 mg dose   Yes Historical Provider, MD  topiramate (TOPAMAX) 25 MG tablet Take 25 mg by mouth at bedtime. Take with 100 mg tablet for a 125 mg dose   Yes Historical Provider, MD    I have reviewed the patient's current medications.  Labs:  Results for orders placed during the hospital encounter of 01/24/14 (from  the past 48 hour(s))  GLUCOSE, CAPILLARY     Status: Abnormal   Collection Time    01/27/14  3:49 PM      Result Value Ref Range   Glucose-Capillary 199 (*) 70 - 99 mg/dL  GLUCOSE, CAPILLARY     Status: Abnormal   Collection Time    01/27/14  7:38 PM      Result Value Ref Range   Glucose-Capillary 254 (*) 70 - 99 mg/dL  Comment 1 Notify RN     Comment 2 Documented in Chart    BASIC METABOLIC PANEL     Status: Abnormal   Collection Time    01/27/14  8:05 PM      Result Value Ref Range   Sodium 137  137 - 147 mEq/L   Potassium 4.1  3.7 - 5.3 mEq/L   Chloride 102  96 - 112 mEq/L   CO2 17 (*) 19 - 32 mEq/L   Glucose, Bld 254 (*) 70 - 99 mg/dL   BUN 27 (*) 6 - 23 mg/dL   Creatinine, Ser 4.55 (*) 0.50 - 1.10 mg/dL   Calcium 7.6 (*) 8.4 - 10.5 mg/dL   GFR calc non Af Amer 11 (*) >90 mL/min   GFR calc Af Amer 13 (*) >90 mL/min   Comment: (NOTE)     The eGFR has been calculated using the CKD EPI equation.     This calculation has not been validated in all clinical situations.     eGFR's persistently <90 mL/min signify possible Chronic Kidney     Disease.  LACTIC ACID, PLASMA     Status: None   Collection Time    01/27/14  8:05 PM      Result Value Ref Range   Lactic Acid, Venous 1.5  0.5 - 2.2 mmol/L  GLUCOSE, CAPILLARY     Status: Abnormal   Collection Time    01/28/14 12:06 AM      Result Value Ref Range   Glucose-Capillary 206 (*) 70 - 99 mg/dL  CBC WITH DIFFERENTIAL     Status: Abnormal   Collection Time    01/28/14  2:29 AM      Result Value Ref Range   WBC 11.4 (*) 4.0 - 10.5 K/uL   RBC 3.11 (*) 3.87 - 5.11 MIL/uL   Hemoglobin 8.6 (*) 12.0 - 15.0 g/dL   HCT 25.9 (*) 36.0 - 46.0 %   MCV 83.3  78.0 - 100.0 fL   MCH 27.7  26.0 - 34.0 pg   MCHC 33.2  30.0 - 36.0 g/dL   RDW 15.0  11.5 - 15.5 %   Platelets 225  150 - 400 K/uL   Comment: DELTA CHECK NOTED   Neutrophils Relative % 84 (*) 43 - 77 %   Neutro Abs 9.6 (*) 1.7 - 7.7 K/uL   Lymphocytes Relative 7 (*) 12 - 46  %   Lymphs Abs 0.9  0.7 - 4.0 K/uL   Monocytes Relative 6  3 - 12 %   Monocytes Absolute 0.7  0.1 - 1.0 K/uL   Eosinophils Relative 2  0 - 5 %   Eosinophils Absolute 0.3  0.0 - 0.7 K/uL   Basophils Relative 0  0 - 1 %   Basophils Absolute 0.0  0.0 - 0.1 K/uL  LACTIC ACID, PLASMA     Status: None   Collection Time    01/28/14  2:29 AM      Result Value Ref Range   Lactic Acid, Venous 1.2  0.5 - 2.2 mmol/L  GLUCOSE, CAPILLARY     Status: Abnormal   Collection Time    01/28/14  3:37 AM      Result Value Ref Range   Glucose-Capillary 214 (*) 70 - 99 mg/dL  GLUCOSE, CAPILLARY     Status: Abnormal   Collection Time    01/28/14  8:32 AM      Result Value Ref Range   Glucose-Capillary 179 (*) 70 -  99 mg/dL  GLUCOSE, CAPILLARY     Status: Abnormal   Collection Time    01/28/14 12:42 PM      Result Value Ref Range   Glucose-Capillary 241 (*) 70 - 99 mg/dL  GLUCOSE, CAPILLARY     Status: Abnormal   Collection Time    01/28/14  3:47 PM      Result Value Ref Range   Glucose-Capillary 177 (*) 70 - 99 mg/dL   Comment 1 Notify RN     Comment 2 Documented in Chart    BASIC METABOLIC PANEL     Status: Abnormal   Collection Time    01/28/14  4:00 PM      Result Value Ref Range   Sodium 142  137 - 147 mEq/L   Potassium 3.7  3.7 - 5.3 mEq/L   Chloride 104  96 - 112 mEq/L   CO2 20  19 - 32 mEq/L   Glucose, Bld 175 (*) 70 - 99 mg/dL   BUN 31 (*) 6 - 23 mg/dL   Creatinine, Ser 5.72 (*) 0.50 - 1.10 mg/dL   Calcium 8.2 (*) 8.4 - 10.5 mg/dL   GFR calc non Af Amer 9 (*) >90 mL/min   GFR calc Af Amer 10 (*) >90 mL/min   Comment: (NOTE)     The eGFR has been calculated using the CKD EPI equation.     This calculation has not been validated in all clinical situations.     eGFR's persistently <90 mL/min signify possible Chronic Kidney     Disease.  GLUCOSE, CAPILLARY     Status: Abnormal   Collection Time    01/28/14  7:59 PM      Result Value Ref Range   Glucose-Capillary 186 (*) 70 - 99  mg/dL   Comment 1 Notify RN     Comment 2 Documented in Chart    CLOSTRIDIUM DIFFICILE BY PCR     Status: None   Collection Time    01/28/14  9:35 PM      Result Value Ref Range   C difficile by pcr NEGATIVE  NEGATIVE  GLUCOSE, CAPILLARY     Status: Abnormal   Collection Time    01/29/14  1:01 AM      Result Value Ref Range   Glucose-Capillary 191 (*) 70 - 99 mg/dL  CBC     Status: Abnormal   Collection Time    01/29/14  2:35 AM      Result Value Ref Range   WBC 10.3  4.0 - 10.5 K/uL   RBC 3.05 (*) 3.87 - 5.11 MIL/uL   Hemoglobin 8.4 (*) 12.0 - 15.0 g/dL   HCT 25.3 (*) 36.0 - 46.0 %   MCV 83.0  78.0 - 100.0 fL   MCH 27.5  26.0 - 34.0 pg   MCHC 33.2  30.0 - 36.0 g/dL   RDW 14.7  11.5 - 15.5 %   Platelets 223  150 - 400 K/uL  BASIC METABOLIC PANEL     Status: Abnormal   Collection Time    01/29/14  2:35 AM      Result Value Ref Range   Sodium 143  137 - 147 mEq/L   Potassium 3.7  3.7 - 5.3 mEq/L   Chloride 104  96 - 112 mEq/L   CO2 21  19 - 32 mEq/L   Glucose, Bld 201 (*) 70 - 99 mg/dL   BUN 32 (*) 6 - 23 mg/dL   Creatinine, Ser  6.09 (*) 0.50 - 1.10 mg/dL   Calcium 8.3 (*) 8.4 - 10.5 mg/dL   GFR calc non Af Amer 8 (*) >90 mL/min   GFR calc Af Amer 9 (*) >90 mL/min   Comment: (NOTE)     The eGFR has been calculated using the CKD EPI equation.     This calculation has not been validated in all clinical situations.     eGFR's persistently <90 mL/min signify possible Chronic Kidney     Disease.  GLUCOSE, CAPILLARY     Status: Abnormal   Collection Time    01/29/14  4:29 AM      Result Value Ref Range   Glucose-Capillary 171 (*) 70 - 99 mg/dL  GLUCOSE, CAPILLARY     Status: Abnormal   Collection Time    01/29/14  7:31 AM      Result Value Ref Range   Glucose-Capillary 142 (*) 70 - 99 mg/dL   Comment 1 Documented in Chart     Comment 2 Notify RN    GLUCOSE, CAPILLARY     Status: Abnormal   Collection Time    01/29/14 11:47 AM      Result Value Ref Range    Glucose-Capillary 147 (*) 70 - 99 mg/dL   Comment 1 Documented in Chart     Comment 2 Notify RN       ROS:  A comprehensive review of systems was negative except for: Cardiovascular: positive for lower extremity edema Gastrointestinal: positive for diarrhea Musculoskeletal: positive for back pain  Physical Exam: Filed Vitals:   01/29/14 1143  BP: 125/80  Pulse: 86  Temp: 98.1 F (36.7 C)  Resp: 21     General: Well-developed young black female. Alert and in no acute distress  HEENT: pupils are equally round and reactive to light, extra motions are intact, mucous members are moist  Neck: There is no obvious jugular venous distention  Heart: regular rate and rhythm without murmur, gallop, or rub  Lungs: mostly clear to auscultation bilaterally  Abdomen: obese, possibly some abdominal wall edema - she does have a JP drain in her back. The wound was not examined  Extremities: pitting edema to thighbut better per patient report s  Skin: warm and dry  Neuro: alert and oriented. Neurosurgery reports some lower extremity weakness.   Assessment/Plan: 38 year old black female hospitalized with wound after back surgery with group A strep bacteremia and hypotension. She has acute kidney injury as a result of this.  1.Renal- Acute kidney injury likely a result of ATN from her group a strep bacteremia and hypotension. She had a period of oliguria but fortunately now is nonoliguric. Her creatinine continues to rise but the rate of rise appears to be decreasing. There are not any indications for dialysis at this time. Given the fact that she is young and had normal renal function at baseline I feel like she has a very good renal prognosis. I suspect she has only very low risk of possibly requiring dialysis before this resolves itself.  I did decrease her Neurontin from 600 to 300 mg daily so as not to get into a toxicity-type situation. Once her renal function improves that dose can be titrated up.   2. Hypertension/volume  - Hypotension has resolved and she is now normotensive. She is on moderate dose carvedilol. I think is okay to continue this at this time but will place parameters. She is on other medications for pain etc. which could lower her blood pressure is  well. She is going. Lasix daily was added to her regimen today. I don't have a problem with this I will continue the dose as is. I feel like she will likely auto diurese as well as her kidney function improves. 3. group A strep bacteremia -  infectious disease is following and she is currently on Rocephin. She is due to have a PICC line placement for home antibiotics. Because her renal prognosis is so good it is okay to place at this time.  Her C. difficile is negative  4. Anemia  - This is likely situational in nature. Supportive care for now    Thank you for this consultation. We'll continue to follow with you   Maizee Reinhold A 01/29/2014, 1:37 PM

## 2014-01-29 NOTE — Progress Notes (Signed)
ANTIBIOTIC CONSULT NOTE - INITIAL  Pharmacy Consult for Zosyn Indication: lumbar wound infection due to enterobacter, group A strep;  Group A strep bacteremia  Allergies  Allergen Reactions  . Ciprofloxacin Swelling  . Azithromycin Itching and Rash  . Food Itching and Rash    "Greek yogurt"    Patient Measurements: Weight 110 kg on 01/18/14 Height 165 cm on 01/16/14 Vital Signs: Temp: 98.1 F (36.7 C) (06/18 1143) Temp src: Oral (06/18 1143) BP: 125/80 mmHg (06/18 1143) Pulse Rate: 86 (06/18 1143) Labs:  Recent Labs  01/27/14 2005 01/28/14 0229 01/28/14 1600 01/29/14 0235  WBC  --  11.4*  --  10.3  HGB  --  8.6*  --  8.4*  PLT  --  225  --  223  CREATININE 4.55*  --  5.72* 6.09*   Estimated Creatinine Clearance: 16.1 ml/min (by C-G formula based on Cr of 6.09). Microbiology: Recent Results (from the past 720 hour(s))  SURGICAL PCR SCREEN     Status: None   Collection Time    01/13/14  1:31 PM      Result Value Ref Range Status   MRSA, PCR NEGATIVE  NEGATIVE Final   Staphylococcus aureus NEGATIVE  NEGATIVE Final   Comment:            The Xpert SA Assay (FDA     approved for NASAL specimens     in patients over 42 years of age),     is one component of     a comprehensive surveillance     program.  Test performance has     been validated by [employer] for patients greater     than or equal to 32 year old.     It is not intended     to diagnose infection nor to     guide or monitor treatment.  MRSA PCR SCREENING     Status: None   Collection Time    01/18/14 11:52 PM      Result Value Ref Range Status   MRSA by PCR NEGATIVE  NEGATIVE Final   Comment:            The GeneXpert MRSA Assay (FDA     approved for NASAL specimens     only), is one component of a     comprehensive MRSA colonization     surveillance program. It is not     intended to diagnose MRSA     infection nor to guide or     monitor treatment for     MRSA infections.  CULTURE, BLOOD  (ROUTINE X 2)     Status: None   Collection Time    01/24/14  9:11 PM      Result Value Ref Range Status   Specimen Description BLOOD LEFT ARM   Final   Special Requests BOTTLES DRAWN AEROBIC AND ANAEROBIC 10CC EACH   Final   Culture  Setup Time     Final   Value: 01/25/2014 00:41     Performed at [facility]   Culture     Final   Value: GROUP A STREP (S.PYOGENES) ISOLATED     Note: CRITICAL RESULT CALLED TO, READ BACK BY AND VERIFIED WITH: JOHN THOMASSON 12/27/13 0850 BY SMITHERSJ     Note: Gram Stain Report Called to,Read Back By and Verified With: THOMAS LILLEY 01/25/14 @ 8:14PM BY RUSCOE A.     Performed at [facility]  Report Status PENDING   Incomplete  CULTURE, BLOOD (ROUTINE X 2)     Status: None   Collection Time    01/24/14 10:41 PM      Result Value Ref Range Status   Specimen Description BLOOD LEFT ARM   Final   Special Requests BOTTLES DRAWN AEROBIC AND ANAEROBIC [facility] EACH   Final   Culture  Setup Time     Final   Value: 01/25/2014 14:08     Performed at [facility]   Culture     Final   Value:        BLOOD CULTURE RECEIVED NO GROWTH TO DATE CULTURE WILL BE HELD FOR 5 DAYS BEFORE ISSUING A FINAL NEGATIVE REPORT     Performed at [facility]   Report Status PENDING   Incomplete  WOUND CULTURE     Status: None   Collection Time    01/25/14  1:48 AM      Result Value Ref Range Status   Specimen Description WOUND BACK   Final   Special Requests NONE   Final   Gram Stain     Final   Value: ABUNDANT WBC PRESENT,BOTH PMN AND MONONUCLEAR     FEW GRAM POSITIVE COCCI IN PAIRS     RARE GRAM NEGATIVE RODS     Gram Stain Report Called to,Read Back By and Verified With: Gram Stain Report Called to,Read Back By and Verified With: Denyse Amass RN [phone] BY GREEN R Performed at [facility]     Performed at [facility]   Culture     Final   Value: MODERATE ENTEROBACTER AEROGENES     ABUNDANT GROUP A STREP (S.PYOGENES)  ISOLATED     Performed at [facility]   Report Status 01/27/2014 FINAL   Final   Organism ID, Bacteria ENTEROBACTER AEROGENES   Final  GRAM STAIN     Status: None   Collection Time    01/25/14  1:48 AM      Result Value Ref Range Status   Specimen Description WOUND BACK   Final   Special Requests NONE   Final   Gram Stain     Final   Value: ABUNDANT WBC PRESENT,BOTH PMN AND MONONUCLEAR     FEW GRAM POSITIVE COCCI IN PAIRS     RARE GRAM NEGATIVE RODS     Gram Stain Report Called to,Read Back By and Verified With: Denyse Amass RN [phone] GREEN R   Report Status 01/25/2014 FINAL   Final  MRSA PCR SCREENING     Status: None   Collection Time    01/25/14  3:05 AM      Result Value Ref Range Status   MRSA by PCR NEGATIVE  NEGATIVE Final   Comment:            The GeneXpert MRSA Assay (FDA     approved for NASAL specimens     only), is one component of a     comprehensive MRSA colonization     surveillance program. It is not     intended to diagnose MRSA     infection nor to guide or     monitor treatment for     MRSA infections.  URINE CULTURE     Status: None   Collection Time    01/25/14  3:30 AM      Result Value Ref Range Status   Specimen Description URINE, CATHETERIZED   Final  Special Requests NONE   Final   Culture  Setup Time     Final   Value: 01/25/2014 14:15     Performed at [facility]     Final   Value: >=100,000 COLONIES/ML     Performed at [facility]   Culture     Final   Value: ENTEROCOCCUS SPECIES     Performed at [facility]   Report Status 01/27/2014 FINAL   Final   Organism ID, Bacteria ENTEROCOCCUS SPECIES   Final  CULTURE, BLOOD (ROUTINE X 2)     Status: None   Collection Time    01/26/14  9:50 AM      Result Value Ref Range Status   Specimen Description BLOOD LEFT ANTECUBITAL   Final   Special Requests BOTTLES DRAWN AEROBIC ONLY 3CC   Final   Culture  Setup Time     Final   Value:  01/26/2014 13:59     Performed at [facility]   Culture     Final   Value:        BLOOD CULTURE RECEIVED NO GROWTH TO DATE CULTURE WILL BE HELD FOR 5 DAYS BEFORE ISSUING A FINAL NEGATIVE REPORT     Performed at [facility]   Report Status PENDING   Incomplete  CULTURE, BLOOD (ROUTINE X 2)     Status: None   Collection Time    01/26/14 10:00 AM      Result Value Ref Range Status   Specimen Description BLOOD RIGHT HAND   Final   Special Requests BOTTLES DRAWN AEROBIC ONLY 2CC   Final   Culture  Setup Time     Final   Value: 01/26/2014 13:58     Performed at [facility]   Culture     Final   Value:        BLOOD CULTURE RECEIVED NO GROWTH TO DATE CULTURE WILL BE HELD FOR 5 DAYS BEFORE ISSUING A FINAL NEGATIVE REPORT     Performed at [facility]   Report Status PENDING   Incomplete  CLOSTRIDIUM DIFFICILE BY PCR     Status: None   Collection Time    01/28/14  9:35 PM      Result Value Ref Range Status   C difficile by pcr NEGATIVE  NEGATIVE Final    Assessment: 82 YOF s/p lumbar fusion surgery on 01/16/14 with lumbar wound infection.  Wound culture with enterobacter and group A strep.  Blood cultures growing group A strep. Will narrow antibiotics to rocephin and place PICC line to complete 6-8 weeks of IV antibiotics     Vanc 6/13 >>6/16 6/15 Vanco trough=55.5 Zosyn 6/13 >> 6/18 clinda 6/16>> 6/18 Rocephin 6/18 >>  6/14 MRSA PCR (-) 6/14 Wound back (post abx) >>enterobacter sens to CTR, cefepime, cipro, septra, zosyn, group a strep 6/13 Blood >>Group a strep 6/14: urine - enterococcus sens to amp/levo/vanco 6/15: blood x 2>>NG  Plan:  Rocephin 2g IV Q 24 hrs as ordered No renal adjustment needed, Pharmacy sign off  Thanks.  Maryanna Shape, PharmD, BCPS  Clinical Pharmacist  Pager: [phone]      01/29/2014,1:26 PM

## 2014-01-29 NOTE — Progress Notes (Signed)
Physical Therapy Treatment Patient Details Name: Marissa [name] MRN: [id number] DOB: [birth year] Today's Date: 01/29/2014    History of Present Illness pt presents after Lumbar I+D with multiple recent back surgeries.      PT Comments    Patient ambulated extended distance today with RW. Continues to require assist for bed mobility secondary to increased pain. Patient distraught regarding current circumstances, offered comfort and conversation. Pt appreciative. Educated patient on techniques to help keep mind occupied while in acute stay. Will continue to see patient and progress as tolerated. Patient may benefit from ambulation outside of unit if MD agrees.  Follow Up Recommendations  Home health PT;Supervision for mobility/OOB     Equipment Recommendations  None recommended by PT    Recommendations for Other Services       Precautions / Restrictions Precautions Precautions: Fall;Back Precaution Booklet Issued: Yes (comment) Precaution Comments: Reviewed precautions.   Required Braces or Orthoses: Spinal Brace Spinal Brace: Lumbar corset;Applied in sitting position Restrictions Weight Bearing Restrictions: No    Mobility  Bed Mobility Overal bed mobility: Needs Assistance Bed Mobility: Rolling;Sidelying to Sit Rolling: Min assist;+2 for physical assistance Sidelying to sit: Min assist     Sit to sidelying: Min assist General bed mobility comments: continues to need assist with bed mobility despite cues and technique, secondary to increased oain  Transfers Overall transfer level: Needs assistance Equipment used: Rolling walker (2 wheeled) Transfers: Sit to/from Stand Sit to Stand: Min assist         General transfer comment: VCs for hand placement, increased pain during transistion  Ambulation/Gait Ambulation/Gait assistance: Supervision Ambulation Distance (Feet): 640 Feet Assistive device: Rolling walker (2 wheeled) Gait Pattern/deviations: Step-through  pattern;Decreased stride length         Stairs            Wheelchair Mobility    Modified Rankin (Stroke Patients Only)       Balance     Sitting balance-Leahy Scale: Fair Sitting balance - Comments: pt would not agree to deflate the bed and feel sitting balance would be better with a delfated bed.     Standing balance-Leahy Scale: Poor                      Cognition Arousal/Alertness: Awake/alert Behavior During Therapy: WFL for tasks assessed/performed Overall Cognitive Status: Within Functional Limits for tasks assessed                      Exercises      General Comments        Pertinent Vitals/Pain 4/10    Home Living                      Prior Function            PT Goals (current goals can now be found in the care plan section) Acute Rehab PT Goals PT Goal Formulation: With patient Time For Goal Achievement: 02/09/14 Potential to Achieve Goals: Good Progress towards PT goals: Progressing toward goals    Frequency  Min 4X/week    PT Plan Current plan remains appropriate    Co-evaluation             End of Session Equipment Utilized During Treatment: Gait belt;Back brace Activity Tolerance: Patient tolerated treatment well Patient left: in chair;with call bell/phone within reach;with family/visitor present     Time: 1720-1744 PT Time Calculation (min): 24 min  Charges:  $Gait  Training: 8-22 mins $Therapeutic Activity: 8-22 mins                    G CodesDuncan Dull 05-Feb-2014, 6:04 PM Alben Deeds, [place] DPT  [phone]

## 2014-01-29 NOTE — Progress Notes (Signed)
Subjective: Patient reports Still with lumbar back pain but leg pain is stable and improving leg strength is also stable  Objective: Vital signs in last 24 hours: Temp:  [97.9 F (36.6 C)-99 F (37.2 C)] 99 F (37.2 C) (06/18 0726) Pulse Rate:  [81-104] 93 (06/18 0726) Resp:  [14-35] 23 (06/18 0726) BP: (126-156)/(59-102) 128/77 mmHg (06/18 0726) SpO2:  [94 %-100 %] 95 % (06/18 0726)  Intake/Output from previous day: 06/17 0701 - 06/18 0700 In: 1510 [P.O.:240; I.V.:1070; IV Piggyback:200] Out: 1640 [Urine:1535; Drains:105] Intake/Output this shift:    For placenta 5 left lower extremity weakness  Lab Results:  Recent Labs  01/28/14 0229 01/29/14 0235  WBC 11.4* 10.3  HGB 8.6* 8.4*  HCT 25.9* 25.3*  PLT 225 223   BMET  Recent Labs  01/28/14 1600 01/29/14 0235  NA 142 143  K 3.7 3.7  CL 104 104  CO2 20 21  GLUCOSE 175* 201*  BUN 31* 32*  CREATININE 5.72* 6.09*  CALCIUM 8.2* 8.3*    Studies/Results: US Renal  01/27/2014   CLINICAL DATA:  Rising BUN and creatinine question hydronephrosis  EXAM: RENAL/URINARY TRACT ULTRASOUND COMPLETE  COMPARISON:  None  FINDINGS: Right Kidney:  Length: 12.3 cm. Normal morphology without mass or hydronephrosis. No shadowing calcifications.  Left Kidney:  Suboptimally visualized question due to body habitus. Unable to accurately measure renal length. An large area of hypo echogenicity is identified in the region of the LEFT kidney which could represent a dilated renal collecting system or a large cyst but is suboptimally localized and delineated.  Bladder:  Decompressed, unable to evaluate.  IMPRESSION: Normal appearing RIGHT kidney.  Unable to assess bladder due to decompressed state.  Inadequate visualization of LEFT kidney to assess length and morphology; an area of hypo echogenicity is identified in the region of LEFT kidney which could potentially represent a dilated renal collecting system or a large cyst but appearance is  nondiagnostic.  Recommend followup non contrast CT to exclude LEFT hydronephrosis.   Electronically Signed   By: Lavonia Dana M.D.   On: 01/27/2014 13:54   Dg Chest Port 1 View  01/28/2014   CLINICAL DATA:  Pulmonary edema.  EXAM: PORTABLE CHEST - 1 VIEW  COMPARISON:  January 27, 2014.  FINDINGS: Cardiomediastinal silhouette appears normal. Bilateral perihilar and basilar opacities are noted which appears slightly worse on the left. These findings are most consistent with pulmonary edema. No pneumothorax is noted. Probable minimal to mild left pleural effusion. Bony thorax is intact.  IMPRESSION: Continued presence of bilateral pulmonary edema which appears to be worse on the left with probable associated minimal to mild left pleural effusion.   Electronically Signed   By: Sabino Dick M.D.   On: 01/28/2014 08:15    Assessment/Plan: Continue to mobilize with physical and outpatient therapy tremulousness in her upper and lower extremities. Kidney function continues to deteriorate or if rising creatinine will discuss with critical-care an additional therapies or involvement of nephrology will check her liver function enzymes. Continues on Zosyn and vancomycin for now over this is subject to change pending results of her cultures.  LOS: 5 days     CRAM,GARY P 01/29/2014, 7:52 AM

## 2014-01-29 NOTE — Progress Notes (Signed)
Inpatient Diabetes Program Recommendations  AACE/ADA: New Consensus Statement on Inpatient Glycemic Control (2013)  Target Ranges:  Prepandial:   less than 140 mg/dL      Peak postprandial:   less than 180 mg/dL (1-2 hours)      Critically ill patients:  140 - 180 mg/dL     Results for Marissa [name], [name] (MRN [id number]) as of 01/29/2014 06:43  Ref. Range 01/28/2014 00:06 01/28/2014 03:37 01/28/2014 08:32 01/28/2014 12:42 01/28/2014 15:47 01/28/2014 19:59  Glucose-Capillary Latest Range: 70-99 mg/dL 206 (H) 214 (H) 179 (H) 241 (H) 177 (H) 186 (H)     **Patient now with Acute Renal failure.    **Patient received 29 units Novolog SSI yesterday.  She is not taking her Victoza here in hospital even though it is listed on her MAR.    MD- Please consider adding low dose basal insulin to patient's in-hospital regimen: Levemir 10-15 units QHS   Will follow Wyn Quaker RN, MSN, CDE Diabetes Coordinator Inpatient Diabetes Program Team Pager: [phone] (8a-10p)

## 2014-01-30 ENCOUNTER — Inpatient Hospital Stay (HOSPITAL_COMMUNITY): Payer: Medicare HMO

## 2014-01-30 DIAGNOSIS — A409 Streptococcal sepsis, unspecified: Secondary | ICD-10-CM | POA: Diagnosis not present

## 2014-01-30 DIAGNOSIS — D6489 Other specified anemias: Secondary | ICD-10-CM

## 2014-01-30 DIAGNOSIS — E1142 Type 2 diabetes mellitus with diabetic polyneuropathy: Secondary | ICD-10-CM

## 2014-01-30 DIAGNOSIS — R6520 Severe sepsis without septic shock: Secondary | ICD-10-CM | POA: Diagnosis present

## 2014-01-30 DIAGNOSIS — N179 Acute kidney failure, unspecified: Secondary | ICD-10-CM | POA: Diagnosis present

## 2014-01-30 DIAGNOSIS — E1149 Type 2 diabetes mellitus with other diabetic neurological complication: Secondary | ICD-10-CM

## 2014-01-30 DIAGNOSIS — T8140XA Infection following a procedure, unspecified, initial encounter: Secondary | ICD-10-CM | POA: Diagnosis not present

## 2014-01-30 DIAGNOSIS — A419 Sepsis, unspecified organism: Secondary | ICD-10-CM | POA: Diagnosis present

## 2014-01-30 DIAGNOSIS — Y849 Medical procedure, unspecified as the cause of abnormal reaction of the patient, or of later complication, without mention of misadventure at the time of the procedure: Secondary | ICD-10-CM

## 2014-01-30 DIAGNOSIS — D649 Anemia, unspecified: Secondary | ICD-10-CM | POA: Diagnosis present

## 2014-01-30 DIAGNOSIS — E669 Obesity, unspecified: Secondary | ICD-10-CM

## 2014-01-30 DIAGNOSIS — Z9889 Other specified postprocedural states: Secondary | ICD-10-CM

## 2014-01-30 DIAGNOSIS — N170 Acute kidney failure with tubular necrosis: Secondary | ICD-10-CM

## 2014-01-30 DIAGNOSIS — E1129 Type 2 diabetes mellitus with other diabetic kidney complication: Secondary | ICD-10-CM | POA: Diagnosis present

## 2014-01-30 DIAGNOSIS — J9600 Acute respiratory failure, unspecified whether with hypoxia or hypercapnia: Secondary | ICD-10-CM

## 2014-01-30 DIAGNOSIS — I10 Essential (primary) hypertension: Secondary | ICD-10-CM

## 2014-01-30 LAB — RENAL FUNCTION PANEL
ALBUMIN: 1.6 g/dL — AB (ref 3.5–5.2)
BUN: 34 mg/dL — AB (ref 6–23)
CO2: 23 meq/L (ref 19–32)
CREATININE: 6.99 mg/dL — AB (ref 0.50–1.10)
Calcium: 8.5 mg/dL (ref 8.4–10.5)
Chloride: 100 mEq/L (ref 96–112)
GFR calc Af Amer: 8 mL/min — ABNORMAL LOW (ref >90)
GFR calc non Af Amer: 7 mL/min — ABNORMAL LOW (ref >90)
Glucose, Bld: 147 mg/dL — ABNORMAL HIGH (ref 70–99)
Phosphorus: 6.6 mg/dL — ABNORMAL HIGH (ref 2.3–4.6)
Potassium: 3.7 mEq/L (ref 3.7–5.3)
Sodium: 140 mEq/L (ref 137–147)

## 2014-01-30 LAB — CBC
HEMATOCRIT: 25.1 % — AB (ref 36.0–46.0)
Hemoglobin: 8.2 g/dL — ABNORMAL LOW (ref 12.0–15.0)
MCH: 27.1 pg (ref 26.0–34.0)
MCHC: 32.7 g/dL (ref 30.0–36.0)
MCV: 82.8 fL (ref 78.0–100.0)
Platelets: 274 10*3/uL (ref 150–400)
RBC: 3.03 MIL/uL — ABNORMAL LOW (ref 3.87–5.11)
RDW: 14.7 % (ref 11.5–15.5)
WBC: 8.7 10*3/uL (ref 4.0–10.5)

## 2014-01-30 LAB — GLUCOSE, CAPILLARY
GLUCOSE-CAPILLARY: 197 mg/dL — AB (ref 70–99)
GLUCOSE-CAPILLARY: 240 mg/dL — AB (ref 70–99)
Glucose-Capillary: 127 mg/dL — ABNORMAL HIGH (ref 70–99)
Glucose-Capillary: 144 mg/dL — ABNORMAL HIGH (ref 70–99)
Glucose-Capillary: 181 mg/dL — ABNORMAL HIGH (ref 70–99)
Glucose-Capillary: 228 mg/dL — ABNORMAL HIGH (ref 70–99)

## 2014-01-30 MED ORDER — SODIUM CHLORIDE 0.9 % IJ SOLN
10.0000 mL | Freq: Two times a day (BID) | INTRAMUSCULAR | Status: DC
  Administered 2014-01-30 – 2014-02-03 (×7): 10 mL

## 2014-01-30 MED ORDER — OXYCODONE HCL 5 MG PO TABS
5.0000 mg | ORAL_TABLET | ORAL | Status: DC | PRN
  Administered 2014-01-30 – 2014-02-04 (×16): 5 mg via ORAL
  Filled 2014-01-30 (×18): qty 1

## 2014-01-30 MED ORDER — SODIUM CHLORIDE 0.9 % IJ SOLN
10.0000 mL | INTRAMUSCULAR | Status: DC | PRN
  Administered 2014-01-31 – 2014-02-05 (×5): 10 mL

## 2014-01-30 MED ORDER — MORPHINE SULFATE 2 MG/ML IJ SOLN
1.0000 mg | INTRAMUSCULAR | Status: DC | PRN
  Administered 2014-01-30 – 2014-02-03 (×4): 2 mg via INTRAVENOUS
  Filled 2014-01-30 (×5): qty 1

## 2014-01-30 MED ORDER — GABAPENTIN 600 MG PO TABS: 300.0000 mg | ORAL_TABLET | Freq: Every day | ORAL | Status: DC

## 2014-01-30 MED ORDER — GABAPENTIN 300 MG PO CAPS
300.0000 mg | ORAL_CAPSULE | Freq: Every day | ORAL | Status: DC
  Administered 2014-01-30 – 2014-02-05 (×7): 300 mg via ORAL
  Filled 2014-01-30 (×7): qty 1

## 2014-01-30 NOTE — Progress Notes (Signed)
[facility] for Infectious Disease    Date of Admission:  01/24/2014   Total days of antibiotics 7        Day 2 ceftriaxone           ID: Marissa [name] is a 38 y.o. female with HTN ,DM, obesity s/p redo decompressive lumbar laminectomy (L5-S1) and posterior lumbar interbody fusion on 01/16/14 presents with sepsis from group a strep bacteremia and post-op wound with group a & enterobacter s/p irrigation and debridement of superficial and deep wound space infection.now complicated by ATN  Active Problems:   Wound infection   Acute respiratory failure with hypoxia   Severe sepsis(995.92)   History of lumbar laminectomy   Acute renal failure   Obesity   Anemia   HTN (hypertension)   Diabetes mellitus    Subjective: Afebrile. Evaluated by nephrology for ATN  Medications:  . atorvastatin  80 mg Per Tube QHS  . carvedilol  12.5 mg Oral BID WC  . cefTRIAXone (ROCEPHIN)  IV  2 g Intravenous Q24H  . furosemide  60 mg Intravenous Daily  . [START ON 01/31/2014] gabapentin  300 mg Oral Daily  . insulin aspart  0-20 Units Subcutaneous 6 times per day  . Liraglutide  1.8 mg Subcutaneous Daily  . pantoprazole  40 mg Oral Daily  . senna  1 tablet Oral BID  . sodium chloride  10-40 mL Intracatheter Q12H  . sodium chloride  3 mL Intravenous Q12H  . topiramate  125 mg Per Tube QHS    Objective: Vital signs in last 24 hours: Temp:  [97.9 F (36.6 C)-99.1 F (37.3 C)] 98.2 F (36.8 C) (06/19 0405) Pulse Rate:  [85-90] 87 (06/19 0425) Resp:  [17-21] 17 (06/19 0425) BP: (125-145)/(73-85) 138/85 mmHg (06/19 0403) SpO2:  [96 %-99 %] 96 % (06/19 0425) Physical Exam  Constitutional:  oriented to person, place, and time. appears well-developed and well-nourished. No distress.  HENT:  Mouth/Throat: Oropharynx is clear and moist. No oropharyngeal exudate.  Cardiovascular: Normal rate, regular rhythm and normal heart sounds. Exam reveals no gallop and no friction rub.  No murmur heard.    Pulmonary/Chest: Effort normal and breath sounds normal. No respiratory distress.  has no wheezes.  Abdominal: Soft. Bowel sounds are normal.  exhibits no distension. There is no tenderness.  Lymphadenopathy: no cervical adenopathy.  Neurological: alert and oriented to person, place, and time.  Skin: Skin is warm and dry. No rash noted. No erythema.  Psychiatric: a normal mood and affect. His behavior is normal.      Lab Results  Recent Labs  01/29/14 0235 01/30/14 0400  WBC 10.3 8.7  HGB 8.4* 8.2*  HCT 25.3* 25.1*  NA 143 140  K 3.7 3.7  CL 104 100  CO2 21 23  BUN 32* 34*  CREATININE 6.09* 6.99*   Liver Panel  Recent Labs  01/30/14 0400  ALBUMIN 1.6*   Sedimentation Rate No results found for this basename: ESRSEDRATE,  in the last 72 hours C-Reactive Protein No results found for this basename: CRP,  in the last 72 hours  Microbiology: 6/15blood cx ngtd 6/14 urine cx amp S enterococcus 6/14 blood cx group a strep 6/14 wound cx enterobacter & group a Studies/Results: Ct Abdomen Pelvis Wo Contrast  01/29/2014   CLINICAL DATA:  LEFT hydronephrosis, history hypertension, coronary artery disease post MI, diabetes, GERD, hyperlipidemia  EXAM: CT ABDOMEN AND PELVIS WITHOUT CONTRAST  TECHNIQUE: Multidetector CT imaging of the abdomen and pelvis  was performed following the standard protocol without IV contrast. Oral contrast not administered. Sagittal and coronal MPR images reconstructed from axial data set.  COMPARISON:  01/27/2014 ultrasound abdomen  FINDINGS: BILATERAL pleural effusions and compressive atelectasis of the lower lobes.  Within limits of a nonenhanced exam, liver, spleen, pancreas, kidneys, and adrenal glands normal appearance.  Specifically no evidence of RIGHT hydronephrosis or cystic RIGHT renal mass identified.  No ureteral dilatation.  Uterus and adnexae unremarkable.  Catheter within bladder which is decompressed.  Normal appendix.  Stomach and bowel loops  grossly normal for technique.  Prior ventral pelvic surgical scar.  No definite mass, adenopathy, free fluid or inflammatory process.  Duplication of IVC.  Scattered atherosclerotic calcification.  Prior L5-S1 fusion.  IMPRESSION: No acute intra-abdominal or intrapelvic abnormalities.  BILATERAL pleural effusions and lower lobe atelectasis.   Electronically Signed   By: Lavonia Dana M.D.   On: 01/29/2014 15:12   Dg Chest Port 1 View  01/30/2014   CLINICAL DATA:  Pulmonary edema  EXAM: PORTABLE CHEST - 1 VIEW  COMPARISON:  January 29, 2014  FINDINGS: There is patchy airspace disease in the bases, stable. There is no appreciable interstitial edema or effusion currently. Heart remains mildly prominent with normal pulmonary vascularity. No adenopathy.  IMPRESSION: There is stable patchy bibasilar airspace disease. Currently there is no edema or effusion. No new opacity.   Electronically Signed   By: Lowella Grip M.D.   On: 01/30/2014 07:38   Dg Chest Port 1 View  01/29/2014   CLINICAL DATA:  Pulmonary edema  EXAM: PORTABLE CHEST - 1 VIEW  COMPARISON:  01/28/2014  FINDINGS: Bilateral airspace disease shows mild interval improvement. Improvement in left lower lobe atelectasis. Small left effusion is present.  IMPRESSION: Mild improvement in pulmonary edema. Improvement in left lower lobe atelectasis. Small left effusion is present.   Electronically Signed   By: Franchot Gallo M.D.   On: 01/29/2014 07:59     Assessment/Plan: Group a strep sepsis = appears improving and no further evidence of ongoing infection. Blood cx from 6/15 still ngtd. Can place picc line, appears also deemed ok from renal standpoint since good likelihood of her recovering from ATN  Group a strep and enterobacter wound infection= isolate is sensitive to ceftriaxone. Continue with ceftriaxone 2gm IV daily x 6 wks  Enterococcus in urine cx= likely asx bacturia. Already received sufficient abtx for tx  AKI 2/2 ATN = being followed by  nephrology and pccm. Medications are being renally dosed to avoid further nephrotoxicity  SNIDER, [facility] Infect[facility] Cell: [phone] Pager: [phone]  01/30/2014, 11:13 AM

## 2014-01-30 NOTE — Progress Notes (Signed)
[facility]  Marissa [name] ZOX:[id number] DOB: [birth year] DOA: 01/24/2014 PCP: PROVIDER NOT IN SYSTEM  Time spent :  Brief narrative: 38 yo female with recent admission for lumbar disc herniation s/p redo decompressive lunbar laminectomy presenting with severe back pain and found to have lumbar wound infection. Patient now s/p lumbar wound irrigation and debridement 01/25/2014 and we are consulted for management of mechanical ventilation. Patient extubated on 6/14  SIGNIFICANT EVENTS / STUDIES:  01/16/2014 - Redo decompressive lumbar laminectomy L5-S1 and posterior lumbar interbody fusion  01/21/2014 [facility] discharge  01/24/2014 - Admission for lumbar wound infection  01/25/2014 - Re-exploration of lumbar wound with irrigation and debridement  01/25/2014 - Extubation  01/27/2014 - Worsening renal failure  01/28/2014 - Plateau of crt  01/30/2012 - Plan for PICC  HPI/Subjective: Easily frustrated if staff does not immediately present to her room when she calls using call bell. Also frustrated over fecal incontinence and inability to physically recognize whether she has passed flatus or a BM  Assessment/Plan: Active Problems:   Severe sepsis -due to wound infection with both gram negative and gram positive bacteremia (Enterobacter and strep) -sepsis physiology has resolved    Wound infection/History of lumbar laminectomy -per Neurosurgery -post re-exploration of lumbar wound with I/D this admission -has bilateral LE weakness and issues with stool and urinary incontinence -PT rec HH PT    Acute respiratory failure with hypoxia due to;   A) Sepsis   B) Acute Pulmonary edema -currently stable on RA -did require mechanical ventilation this admission but was not prolonged    Acute renal failure/ATN -Nephrology following and managing Lasix -Scr continues to rise and did have an oliguric phase but now with adequate UOP -did not require  HD -etiology 2/2 hypotension and strep bacteremia    Diabetes mellitus with peripheral neuropathy -current CBGs controlled on SSI  -refusing Victoza due to GI side effects -ck HgbA1c -Neurontin dose decreased by renal due to recent tremors and concerns over accummulated med in setting of low GFR-can increase up later once renal fnx recovers    HTN (hypertension) -controlled -cont Coreg and Lasix    Obesity    Anemia -possibly dilutional but suspect acute reactant/transient bone marrow depression due to recent sepsis and critical illness -ck anemia panel -baseline ~ 12 and current ~8    DVT prophylaxis: SCDs Code Status: Full Family Communication: No family at bedside Disposition Plan/Expected LOS: Step down   Consultants: Nephrology Critical care medicine Neurosurgery  Procedures: Reexploration of lumbar wound with irrigation and debridement.  6/14  PICC line 6/19  Foley catheter 01/25/2014  CULTURES:  Wound culture 01/25/2014 >>> mod gram neg rods>>>mod enterobacter, abundant group a strep (cefazolin res)  Blood culture x 2 01/25/2014 >>> gram pos cocci chains>>>group a strep  Urine culture 01/25/2014 >>> enteroccus (sens amp)  BC x 2 6/15>>>ngtd  Antibiotics: Vancomycin 01/25/2014 >>>6/15  Zosyn 01/25/2014 >>>  Clindamycin 6/16>>>6/17   Objective: Blood pressure 138/85, pulse 87, temperature 98.2 F (36.8 C), temperature source Oral, resp. rate 17, height 5' 4.96" (1.65 m), weight 259 lb 14.8 oz (117.9 kg), SpO2 96.00%.  Intake/Output Summary (Last 24 hours) at 01/30/14 1048 Last data filed at 01/30/14 0100  Gross per 24 hour  Intake     20 ml  Output   1225 ml  Net  -1205 ml     Exam: General: No acute respiratory distress Lungs: Clear to auscultation bilaterally without wheezes or crackles,  RA Cardiovascular: Regular rate and rhythm without murmur gallop or rub normal S1 and S2, no peripheral edema or JVD Abdomen: Nontender, nondistended, soft, bowel  sounds positive, no rebound, no ascites, no appreciable mass-note incontinent stool and was unaware Genitourinary: Foley  Musculoskeletal: No significant cyanosis, clubbing of bilateral lower extremities   Scheduled Meds:  Scheduled Meds: . atorvastatin  80 mg Per Tube QHS  . carvedilol  12.5 mg Oral BID WC  . cefTRIAXone (ROCEPHIN)  IV  2 g Intravenous Q24H  . furosemide  60 mg Intravenous Daily  . gabapentin  300 mg Per Tube Daily  . insulin aspart  0-20 Units Subcutaneous 6 times per day  . Liraglutide  1.8 mg Subcutaneous Daily  . pantoprazole  40 mg Oral Daily  . senna  1 tablet Oral BID  . sodium chloride  10-40 mL Intracatheter Q12H  . sodium chloride  3 mL Intravenous Q12H  . topiramate  125 mg Per Tube QHS   Continuous Infusions: . sodium chloride Stopped (01/26/14 2000)  . sodium chloride 10 mL/hr at 01/28/14 2300    Data Reviewed: Basic Metabolic Panel:  Recent Labs Lab 01/25/14 0512 01/26/14 0500  01/27/14 0941 01/27/14 2005 01/28/14 1600 01/29/14 0235 01/30/14 0400  NA 141 139  < > 142 137 142 143 140  K 3.9 3.9  < > 3.9 4.1 3.7 3.7 3.7  CL 106 109  < > 106 102 104 104 100  CO2 19 17*  < > 20 17* 20 21 23   GLUCOSE 208* 197*  < > 156* 254* 175* 201* 147*  BUN 10 19  < > 26* 27* 31* 32* 34*  CREATININE 0.95 2.83*  < > 4.45* 4.55* 5.72* 6.09* 6.99*  CALCIUM 8.3* 7.1*  < > 7.8* 7.6* 8.2* 8.3* 8.5  MG  --  1.5  --   --   --   --   --   --   PHOS  --  4.0  --   --   --   --   --  6.6*  < > = values in this interval not displayed. Liver Function Tests:  Recent Labs Lab 01/25/14 0512 01/30/14 0400  AST 15  --   ALT 12  --   ALKPHOS 79  --   BILITOT 1.0  --   PROT 5.8*  --   ALBUMIN 2.0* 1.6*   No results found for this basename: LIPASE, AMYLASE,  in the last 168 hours No results found for this basename: AMMONIA,  in the last 168 hours CBC:  Recent Labs Lab 01/24/14 2111  01/26/14 0500 01/26/14 1115 01/28/14 0229 01/29/14 0235 01/30/14 0400    WBC 19.2*  < > 15.3* 15.5* 11.4* 10.3 8.7  NEUTROABS 17.2*  --   --  13.2* 9.6*  --   --   HGB 9.8*  < > 8.4* 8.4* 8.6* 8.4* 8.2*  HCT 29.6*  < > 25.8* 25.2* 25.9* 25.3* 25.1*  MCV 84.3  < > 85.7 85.4 83.3 83.0 82.8  PLT 262  < > 167 170 225 223 274  < > = values in this interval not displayed. Cardiac Enzymes:  Recent Labs Lab 01/25/14 0512  TROPONINI <0.30   BNP (last 3 results)  Recent Labs  10/31/13 1446  PROBNP 253.7*   CBG:  Recent Labs Lab 01/29/14 1147 01/29/14 1607 01/29/14 1949 01/29/14 2345 01/30/14 0404  GLUCAP 147* 123* 195* 181* 127*    Recent Results (from the past 240  hour(s))  CULTURE, BLOOD (ROUTINE X 2)     Status: None   Collection Time    01/24/14  9:11 PM      Result Value Ref Range Status   Specimen Description BLOOD LEFT ARM   Final   Special Requests BOTTLES DRAWN AEROBIC AND ANAEROBIC 10CC EACH   Final   Culture  Setup Time     Final   Value: 01/25/2014 00:41     Performed at [facility]   Culture     Final   Value: GROUP A STREP (S.PYOGENES) ISOLATED     Note: CRITICAL RESULT CALLED TO, READ BACK BY AND VERIFIED WITH: JOHN THOMASSON 12/27/13 0850 BY SMITHERSJ     Note: Gram Stain Report Called to,Read Back By and Verified With: THOMAS LILLEY 01/25/14 @ 8:14PM BY RUSCOE A.     Performed at [facility]   Report Status PENDING   Incomplete  CULTURE, BLOOD (ROUTINE X 2)     Status: None   Collection Time    01/24/14 10:41 PM      Result Value Ref Range Status   Specimen Description BLOOD LEFT ARM   Final   Special Requests BOTTLES DRAWN AEROBIC AND ANAEROBIC [facility] EACH   Final   Culture  Setup Time     Final   Value: 01/25/2014 14:08     Performed at [facility]   Culture     Final   Value:        BLOOD CULTURE RECEIVED NO GROWTH TO DATE CULTURE WILL BE HELD FOR 5 DAYS BEFORE ISSUING A FINAL NEGATIVE REPORT     Performed at [facility]   Report Status PENDING   Incomplete  WOUND CULTURE     Status:  None   Collection Time    01/25/14  1:48 AM      Result Value Ref Range Status   Specimen Description WOUND BACK   Final   Special Requests NONE   Final   Gram Stain     Final   Value: ABUNDANT WBC PRESENT,BOTH PMN AND MONONUCLEAR     FEW GRAM POSITIVE COCCI IN PAIRS     RARE GRAM NEGATIVE RODS     Gram Stain Report Called to,Read Back By and Verified With: Gram Stain Report Called to,Read Back By and Verified With: Denyse Amass RN [phone] BY GREEN R Performed at [facility]     Performed at [facility]   Culture     Final   Value: MODERATE ENTEROBACTER AEROGENES     ABUNDANT GROUP A STREP (S.PYOGENES) ISOLATED     Performed at [facility]   Report Status 01/27/2014 FINAL   Final   Organism ID, Bacteria ENTEROBACTER AEROGENES   Final  GRAM STAIN     Status: None   Collection Time    01/25/14  1:48 AM      Result Value Ref Range Status   Specimen Description WOUND BACK   Final   Special Requests NONE   Final   Gram Stain     Final   Value: ABUNDANT WBC PRESENT,BOTH PMN AND MONONUCLEAR     FEW GRAM POSITIVE COCCI IN PAIRS     RARE GRAM NEGATIVE RODS     Gram Stain Report Called to,Read Back By and Verified With: Denyse Amass RN [phone] GREEN R   Report Status 01/25/2014 FINAL   Final  MRSA PCR SCREENING     Status:  None   Collection Time    01/25/14  3:05 AM      Result Value Ref Range Status   MRSA by PCR NEGATIVE  NEGATIVE Final   Comment:            The GeneXpert MRSA Assay (FDA     approved for NASAL specimens     only), is one component of a     comprehensive MRSA colonization     surveillance program. It is not     intended to diagnose MRSA     infection nor to guide or     monitor treatment for     MRSA infections.  URINE CULTURE     Status: None   Collection Time    01/25/14  3:30 AM      Result Value Ref Range Status   Specimen Description URINE, CATHETERIZED   Final   Special Requests NONE   Final   Culture  Setup Time      Final   Value: 01/25/2014 14:15     Performed at [facility]     Final   Value: >=100,000 COLONIES/ML     Performed at [facility]   Culture     Final   Value: ENTEROCOCCUS SPECIES     Performed at [facility]   Report Status 01/27/2014 FINAL   Final   Organism ID, Bacteria ENTEROCOCCUS SPECIES   Final  CULTURE, BLOOD (ROUTINE X 2)     Status: None   Collection Time    01/26/14  9:50 AM      Result Value Ref Range Status   Specimen Description BLOOD LEFT ANTECUBITAL   Final   Special Requests BOTTLES DRAWN AEROBIC ONLY 3CC   Final   Culture  Setup Time     Final   Value: 01/26/2014 13:59     Performed at [facility]   Culture     Final   Value:        BLOOD CULTURE RECEIVED NO GROWTH TO DATE CULTURE WILL BE HELD FOR 5 DAYS BEFORE ISSUING A FINAL NEGATIVE REPORT     Performed at [facility]   Report Status PENDING   Incomplete  CULTURE, BLOOD (ROUTINE X 2)     Status: None   Collection Time    01/26/14 10:00 AM      Result Value Ref Range Status   Specimen Description BLOOD RIGHT HAND   Final   Special Requests BOTTLES DRAWN AEROBIC ONLY 2CC   Final   Culture  Setup Time     Final   Value: 01/26/2014 13:58     Performed at [facility]   Culture     Final   Value:        BLOOD CULTURE RECEIVED NO GROWTH TO DATE CULTURE WILL BE HELD FOR 5 DAYS BEFORE ISSUING A FINAL NEGATIVE REPORT     Performed at [facility]   Report Status PENDING   Incomplete  CLOSTRIDIUM DIFFICILE BY PCR     Status: None   Collection Time    01/28/14  9:35 PM      Result Value Ref Range Status   C difficile by pcr NEGATIVE  NEGATIVE Final     Studies:  Recent x-ray studies have been reviewed in detail by the Attending Physician       Erin Hearing, ANP [facility] Office  [phone] Pager [phone]   **If unable  to reach the above provider after paging please contact the Flow Manager @  [phone]  On-Call/Text Page:      [name]      password [email or web address]  If 7PM-7AM, please contact night-coverage [email or web address] Password [email or web address] 01/30/2014, 10:48 AM   LOS: 6 days    Examined patient and discussed assessment and plan with ANP Ebony Hail, agree with above plan. Patient with multiple complex medical problems,> 35 minutes was spent in direct medical care. Answered all questions from patient and family members.

## 2014-01-30 NOTE — Progress Notes (Signed)
Peripherally Inserted Central Catheter/Midline Placement  The IV Nurse has discussed with the patient and/or persons authorized to consent for the patient, the purpose of this procedure and the potential benefits and risks involved with this procedure.  The benefits include less needle sticks, lab draws from the catheter and patient may be discharged home with the catheter.  Risks include, but not limited to, infection, bleeding, blood clot (thrombus formation), and puncture of an artery; nerve damage and irregular heat beat.  Alternatives to this procedure were also discussed.  PICC/Midline Placement Documentation  PICC / Midline Single Lumen 46/28/63 PICC Left Basilic 43 cm 4 cm (Active)  Indication for Insertion or Continuance of Line Home intravenous therapies (PICC only) 01/30/2014 10:00 AM  Exposed Catheter (cm) 4 cm 01/30/2014 10:00 AM  Dressing Change Due 02/06/14 01/30/2014 10:00 AM       Jule Economy Horton 01/30/2014, 10:41 AM

## 2014-01-30 NOTE — Progress Notes (Signed)
Subjective: Patient reports Overall she feels a little better today pain is better controlled still mostly localized to her back still difficulty with tremulousness no nausea no vomiting improved lower extremity function  Objective: Vital signs in last 24 hours: Temp:  [97.9 F (36.6 C)-99.1 F (37.3 C)] 98.2 F (36.8 C) (06/19 0405) Pulse Rate:  [85-90] 87 (06/19 0425) Resp:  [17-21] 17 (06/19 0425) BP: (125-145)/(73-85) 138/85 mmHg (06/19 0403) SpO2:  [96 %-99 %] 96 % (06/19 0425)  Intake/Output from previous day: 06/18 0701 - 06/19 0700 In: 100 [I.V.:100] Out: 1225 [Urine:1225] Intake/Output this shift:    For placenta 5 left looks to me weakness otherwise out of 5 incision clean dry and intact discontinued her Hemovac  Lab Results:  Recent Labs  01/29/14 0235 01/30/14 0400  WBC 10.3 8.7  HGB 8.4* 8.2*  HCT 25.3* 25.1*  PLT 223 274   BMET  Recent Labs  01/29/14 0235 01/30/14 0400  NA 143 140  K 3.7 3.7  CL 104 100  CO2 21 23  GLUCOSE 201* 147*  BUN 32* 34*  CREATININE 6.09* 6.99*  CALCIUM 8.3* 8.5    Studies/Results: Ct Abdomen Pelvis Wo Contrast  01/29/2014   CLINICAL DATA:  LEFT hydronephrosis, history hypertension, coronary artery disease post MI, diabetes, GERD, hyperlipidemia  EXAM: CT ABDOMEN AND PELVIS WITHOUT CONTRAST  TECHNIQUE: Multidetector CT imaging of the abdomen and pelvis was performed following the standard protocol without IV contrast. Oral contrast not administered. Sagittal and coronal MPR images reconstructed from axial data set.  COMPARISON:  01/27/2014 ultrasound abdomen  FINDINGS: BILATERAL pleural effusions and compressive atelectasis of the lower lobes.  Within limits of a nonenhanced exam, liver, spleen, pancreas, kidneys, and adrenal glands normal appearance.  Specifically no evidence of RIGHT hydronephrosis or cystic RIGHT renal mass identified.  No ureteral dilatation.  Uterus and adnexae unremarkable.  Catheter within bladder which  is decompressed.  Normal appendix.  Stomach and bowel loops grossly normal for technique.  Prior ventral pelvic surgical scar.  No definite mass, adenopathy, free fluid or inflammatory process.  Duplication of IVC.  Scattered atherosclerotic calcification.  Prior L5-S1 fusion.  IMPRESSION: No acute intra-abdominal or intrapelvic abnormalities.  BILATERAL pleural effusions and lower lobe atelectasis.   Electronically Signed   By: Lavonia Dana M.D.   On: 01/29/2014 15:12   Dg Chest Port 1 View  01/30/2014   CLINICAL DATA:  Pulmonary edema  EXAM: PORTABLE CHEST - 1 VIEW  COMPARISON:  January 29, 2014  FINDINGS: There is patchy airspace disease in the bases, stable. There is no appreciable interstitial edema or effusion currently. Heart remains mildly prominent with normal pulmonary vascularity. No adenopathy.  IMPRESSION: There is stable patchy bibasilar airspace disease. Currently there is no edema or effusion. No new opacity.   Electronically Signed   By: Lowella Grip M.D.   On: 01/30/2014 07:38   Dg Chest Port 1 View  01/29/2014   CLINICAL DATA:  Pulmonary edema  EXAM: PORTABLE CHEST - 1 VIEW  COMPARISON:  01/28/2014  FINDINGS: Bilateral airspace disease shows mild interval improvement. Improvement in left lower lobe atelectasis. Small left effusion is present.  IMPRESSION: Mild improvement in pulmonary edema. Improvement in left lower lobe atelectasis. Small left effusion is present.   Electronically Signed   By: Franchot Gallo M.D.   On: 01/29/2014 07:59    Assessment/Plan: Continue mobilizes physical and occupational therapy antibiotic change seems to have started to improve her pain control and stabilize trial sinus.  Appreciate critical-care ID and nephrology assistance.  LOS: 6 days     CRAM,GARY P 01/30/2014, 9:16 AM

## 2014-01-30 NOTE — Progress Notes (Addendum)
BACKGROUND [name] with a background of DM, HTN, normal renal function at baseline (0.95). Had L5S1 decompression procedure 6/5, readmitted on 6/13 with pain, fever, draining wound - had irrigation and debridement of wound with "vancomycin powder" placed in the deep wound space and was started on vanco and zosyn. Developed hypotension 6/14 into the 80's but no pressors ever required. Blood cultures subsequently + for group A strep. Wound + Group A strep and enterobacter, urine + enterococcus.  Over ensuing 24 hours 6/14->6/15 rapid rise in creatinine from 0.95 to 2.8 and subsequently to 6 on 6/18 and we were asked to see.  She is non-oliguric but creatinine continues to rise. Blood pressures were soft through the 16th but are normal at this time.  Ultrasound raised question of hydro -> CT discounted that.    Assessment/Plan:   1.Renal- Acute kidney injury likely a result of ATN from her group a strep bacteremia and hypotension. She had a period of oliguria - now non-oliguric.  Creatinine however continues to rise. Given the fact that she is young and had normal renal function at baseline her prognosis for ultimate recovery should be good.  Would predict only very low risk of possibly requiring dialysis before this resolves itself.   2. Hypertension/volume - Hypotension has resolved and she is now normotensive. She is on moderate dose carvedilol. Okay to continue this at this time with parameters. Lasix daily was added to her regimen 6/18.  She will likely auto diurese as well as her kidney function improves.  3. Group A strep bacteremia/wound with GpA strep and enterobacter, urine with enterococcus - infectious disease is following and she is currently on Rocephin. PICC line OK since such good recovery prognosis 4. Anemia -Likely situational in nature. Supportive care for now   Subjective:  Back hurts Can't tell when needs to have BM Nauseous (says was yesterday as well) No SOB  Objective Vital  signs in last 24 hours: Filed Vitals:   01/29/14 2349 01/30/14 0403 01/30/14 0405 01/30/14 0425  BP:  138/85    Pulse:    87  Temp: 99.1 F (37.3 C)  98.2 F (36.8 C)   TempSrc: Oral  Oral   Resp:  21  17  Height:      Weight:      SpO2:  98%  96%   Weight change:   Intake/Output Summary (Last 24 hours) at 01/30/14 1111 Last data filed at 01/30/14 0100  Gross per 24 hour  Intake     10 ml  Output   1225 ml  Net  -1215 ml   Physical Exam:  Blood pressure 138/85, pulse 87, temperature 98.2 F (36.8 C), temperature source Oral, resp. rate 17, height 5' 4.96" (1.65 m), weight 117.9 kg (259 lb 14.8 oz), SpO2 96.00%. Very nice you BF VS as noted Lungs clear No rub Abd soft 2+ edema LE's Clear dilute appearing urine in foley  Recent Labs Lab 01/25/14 0512 01/26/14 0500 01/27/14 0233 01/27/14 0941 01/27/14 2005 01/28/14 1600 01/29/14 0235 01/30/14 0400  NA 141 139 140 142 137 142 143 140  K 3.9 3.9 3.8 3.9 4.1 3.7 3.7 3.7  CL 106 109 105 106 102 104 104 100  CO2 19 17* 18* 20 17* 20 21 23   GLUCOSE 208* 197* 135* 156* 254* 175* 201* 147*  BUN 10 19 24* 26* 27* 31* 32* 34*  CREATININE 0.95 2.83* 4.02* 4.45* 4.55* 5.72* 6.09* 6.99*  CALCIUM 8.3* 7.1* 7.4* 7.8* 7.6*  8.2* 8.3* 8.5  PHOS  --  4.0  --   --   --   --   --  6.6*   Recent Labs Lab 01/25/14 0512 01/30/14 0400  AST 15  --   ALT 12  --   ALKPHOS 79  --   BILITOT 1.0  --   PROT 5.8*  --   ALBUMIN 2.0* 1.6*    Recent Labs Lab 01/24/14 2111  01/26/14 1115 01/28/14 0229 01/29/14 0235 01/30/14 0400  WBC 19.2*  < > 15.5* 11.4* 10.3 8.7  NEUTROABS 17.2*  --  13.2* 9.6*  --   --   HGB 9.8*  < > 8.4* 8.6* 8.4* 8.2*  HCT 29.6*  < > 25.2* 25.9* 25.3* 25.1*  MCV 84.3  < > 85.4 83.3 83.0 82.8  PLT 262  < > 170 225 223 274  < > = values in this interval not displayed.  Recent Labs Lab 01/25/14 0512  TROPONINI <0.30     Recent Labs Lab 01/29/14 1147 01/29/14 1607 01/29/14 1949 01/29/14 2345  01/30/14 0404  GLUCAP 147* 123* 195* 181* 127*   Studies/Results: Ct Abdomen Pelvis Wo Contrast  01/29/2014   CLINICAL DATA:  LEFT hydronephrosis, history hypertension, coronary artery disease post MI, diabetes, GERD, hyperlipidemia  EXAM: CT ABDOMEN AND PELVIS WITHOUT CONTRAST  TECHNIQUE: Multidetector CT imaging of the abdomen and pelvis was performed following the standard protocol without IV contrast. Oral contrast not administered. Sagittal and coronal MPR images reconstructed from axial data set.  COMPARISON:  01/27/2014 ultrasound abdomen  FINDINGS: BILATERAL pleural effusions and compressive atelectasis of the lower lobes.  Within limits of a nonenhanced exam, liver, spleen, pancreas, kidneys, and adrenal glands normal appearance.  Specifically no evidence of RIGHT hydronephrosis or cystic RIGHT renal mass identified.  No ureteral dilatation.  Uterus and adnexae unremarkable.  Catheter within bladder which is decompressed.  Normal appendix.  Stomach and bowel loops grossly normal for technique.  Prior ventral pelvic surgical scar.  No definite mass, adenopathy, free fluid or inflammatory process.  Duplication of IVC.  Scattered atherosclerotic calcification.  Prior L5-S1 fusion.  IMPRESSION: No acute intra-abdominal or intrapelvic abnormalities.  BILATERAL pleural effusions and lower lobe atelectasis.   Electronically Signed   By: Lavonia Dana M.D.   On: 01/29/2014 15:12   Dg Chest Port 1 View  01/30/2014   CLINICAL DATA:  Pulmonary edema  EXAM: PORTABLE CHEST - 1 VIEW  COMPARISON:  January 29, 2014  FINDINGS: There is patchy airspace disease in the bases, stable. There is no appreciable interstitial edema or effusion currently. Heart remains mildly prominent with normal pulmonary vascularity. No adenopathy.  IMPRESSION: There is stable patchy bibasilar airspace disease. Currently there is no edema or effusion. No new opacity.   Electronically Signed   By: Lowella Grip M.D.   On: 01/30/2014 07:38    Dg Chest Port 1 View  01/29/2014   CLINICAL DATA:  Pulmonary edema  EXAM: PORTABLE CHEST - 1 VIEW  COMPARISON:  01/28/2014  FINDINGS: Bilateral airspace disease shows mild interval improvement. Improvement in left lower lobe atelectasis. Small left effusion is present.  IMPRESSION: Mild improvement in pulmonary edema. Improvement in left lower lobe atelectasis. Small left effusion is present.   Electronically Signed   By: Franchot Gallo M.D.   On: 01/29/2014 07:59   Medications: . sodium chloride Stopped (01/26/14 2000)  . sodium chloride 10 mL/hr at 01/28/14 2300   . atorvastatin  80 mg Per Tube QHS  .  carvedilol  12.5 mg Oral BID WC  . cefTRIAXone (ROCEPHIN)  IV  2 g Intravenous Q24H  . furosemide  60 mg Intravenous Daily  . [START ON 01/31/2014] gabapentin  300 mg Oral Daily  . insulin aspart  0-20 Units Subcutaneous 6 times per day  . Liraglutide  1.8 mg Subcutaneous Daily  . pantoprazole  40 mg Oral Daily  . senna  1 tablet Oral BID  . sodium chloride  10-40 mL Intracatheter Q12H  . sodium chloride  3 mL Intravenous Q12H  . topiramate  125 mg Per Tube QHS   Jamal Maes, MD [facility] [phone] pager 01/30/2014, 11:11 AM

## 2014-01-31 DIAGNOSIS — M549 Dorsalgia, unspecified: Secondary | ICD-10-CM

## 2014-01-31 DIAGNOSIS — T148XXA Other injury of unspecified body region, initial encounter: Secondary | ICD-10-CM

## 2014-01-31 DIAGNOSIS — B950 Streptococcus, group A, as the cause of diseases classified elsewhere: Secondary | ICD-10-CM

## 2014-01-31 DIAGNOSIS — A419 Sepsis, unspecified organism: Secondary | ICD-10-CM

## 2014-01-31 DIAGNOSIS — M5126 Other intervertebral disc displacement, lumbar region: Secondary | ICD-10-CM

## 2014-01-31 DIAGNOSIS — A409 Streptococcal sepsis, unspecified: Principal | ICD-10-CM

## 2014-01-31 DIAGNOSIS — L089 Local infection of the skin and subcutaneous tissue, unspecified: Secondary | ICD-10-CM

## 2014-01-31 DIAGNOSIS — B9689 Other specified bacterial agents as the cause of diseases classified elsewhere: Secondary | ICD-10-CM

## 2014-01-31 LAB — RENAL FUNCTION PANEL
ALBUMIN: 1.7 g/dL — AB (ref 3.5–5.2)
BUN: 35 mg/dL — ABNORMAL HIGH (ref 6–23)
CO2: 22 mEq/L (ref 19–32)
Calcium: 8.5 mg/dL (ref 8.4–10.5)
Chloride: 99 mEq/L (ref 96–112)
Creatinine, Ser: 7.4 mg/dL — ABNORMAL HIGH (ref 0.50–1.10)
GFR calc non Af Amer: 6 mL/min — ABNORMAL LOW (ref >90)
GFR, EST AFRICAN AMERICAN: 7 mL/min — AB (ref >90)
Glucose, Bld: 182 mg/dL — ABNORMAL HIGH (ref 70–99)
PHOSPHORUS: 6.8 mg/dL — AB (ref 2.3–4.6)
Potassium: 3.4 mEq/L — ABNORMAL LOW (ref 3.7–5.3)
SODIUM: 141 meq/L (ref 137–147)

## 2014-01-31 LAB — HEMOGLOBIN A1C
HEMOGLOBIN A1C: 9.7 % — AB (ref <5.7)
Mean Plasma Glucose: 232 mg/dL — ABNORMAL HIGH (ref <117)

## 2014-01-31 LAB — URINALYSIS, ROUTINE W REFLEX MICROSCOPIC
Bilirubin Urine: NEGATIVE
Glucose, UA: NEGATIVE mg/dL
Hgb urine dipstick: NEGATIVE
KETONES UR: NEGATIVE mg/dL
NITRITE: NEGATIVE
PROTEIN: NEGATIVE mg/dL
Specific Gravity, Urine: 1.008 (ref 1.005–1.030)
UROBILINOGEN UA: 0.2 mg/dL (ref 0.0–1.0)
pH: 7 (ref 5.0–8.0)

## 2014-01-31 LAB — GLUCOSE, CAPILLARY
GLUCOSE-CAPILLARY: 205 mg/dL — AB (ref 70–99)
GLUCOSE-CAPILLARY: 101 mg/dL — AB (ref 70–99)
Glucose-Capillary: 230 mg/dL — ABNORMAL HIGH (ref 70–99)
Glucose-Capillary: 132 mg/dL — ABNORMAL HIGH (ref 70–99)
Glucose-Capillary: 166 mg/dL — ABNORMAL HIGH (ref 70–99)
Glucose-Capillary: 135 mg/dL — ABNORMAL HIGH (ref 70–99)

## 2014-01-31 LAB — FERRITIN: Ferritin: 106 ng/mL (ref 10–291)

## 2014-01-31 LAB — IRON AND TIBC
Iron: 17 ug/dL — ABNORMAL LOW (ref 42–135)
SATURATION RATIOS: 10 % — AB (ref 20–55)
TIBC: 170 ug/dL — ABNORMAL LOW (ref 250–470)
UIBC: 153 ug/dL (ref 125–400)

## 2014-01-31 LAB — RETICULOCYTES
RBC.: 2.99 MIL/uL — ABNORMAL LOW (ref 3.87–5.11)
Retic Count, Absolute: 32.9 10*3/uL (ref 19.0–186.0)
Retic Ct Pct: 1.1 % (ref 0.4–3.1)

## 2014-01-31 LAB — CULTURE, BLOOD (ROUTINE X 2): Culture: NO GROWTH

## 2014-01-31 LAB — URINE MICROSCOPIC-ADD ON

## 2014-01-31 LAB — VITAMIN B12: VITAMIN B 12: 985 pg/mL — AB (ref 211–911)

## 2014-01-31 LAB — FOLATE: FOLATE: 12.5 ng/mL

## 2014-01-31 MED ORDER — POTASSIUM CHLORIDE CRYS ER 20 MEQ PO TBCR
20.0000 meq | EXTENDED_RELEASE_TABLET | Freq: Once | ORAL | Status: AC
  Administered 2014-01-31: 20 meq via ORAL
  Filled 2014-01-31: qty 1

## 2014-01-31 NOTE — Progress Notes (Signed)
Pt seen and examined. No issues overnight. Pt has no new complaints. Unchanged back pain and "tremulousness."  EXAM: Temp:  [98 F (36.7 C)-99.2 F (37.3 C)] 98.3 F (36.8 C) (06/20 0700) Pulse Rate:  [80-108] 90 (06/20 0850) Resp:  [14-20] 16 (06/20 0850) BP: (121-152)/(68-91) 152/87 mmHg (06/20 0850) SpO2:  [92 %-98 %] 95 % (06/20 0850) Intake/Output     06/19 0701 - 06/20 0700 06/20 0701 - 06/21 0700   P.O. 1200    I.V. (mL/kg)     IV Piggyback 50    Total Intake(mL/kg) 1250 (10.6)    Urine (mL/kg/hr) 2225 (0.8)    Total Output 2225     Net -975          Stool Occurrence 3 x     Awake, alert, oriented Speech fluent CN grossly intact Good strength  LABS: Lab Results  Component Value Date   CREATININE 7.40* 01/31/2014   BUN 35* 01/31/2014   NA 141 01/31/2014   K 3.4* 01/31/2014   CL 99 01/31/2014   CO2 22 01/31/2014   Lab Results  Component Value Date   WBC 8.7 01/30/2014   HGB 8.2* 01/30/2014   HCT 25.1* 01/30/2014   MCV 82.8 01/30/2014   PLT 274 01/30/2014    IMAGING: CTH reviewed, unremarkable.  IMPRESSION: - 38 y.o. female with Grp A strep bacteremia from wound infection (Grp A Strep/Enterococcus) --> ATN and AKI - Neurologically stable - Sepsis improved  PLAN: - Cont Abx - Cont to mobilize with PT/OT - Appreciate ID and nephrology input.

## 2014-01-31 NOTE — Progress Notes (Signed)
BACKGROUND [name] with a background of DM, HTN, normal renal function at baseline (0.95). Had L5S1 decompression procedure 6/5, readmitted on 6/13 with pain, fever, draining wound - had irrigation and debridement of wound with "vancomycin powder" placed in the deep wound space and was started on vanco and zosyn. Developed hypotension 6/14 into the 80's but no pressors ever required. Blood cultures subsequently + for group A strep. Wound + Group A strep and enterobacter, urine + enterococcus.  Over ensuing 24 hours 6/14->6/15 rapid rise in creatinine from 0.95 to 2.8 and subsequently to 6 on 6/18 and we were asked to see.  She is non-oliguric but creatinine continues to rise. Blood pressures were soft through the 16th but are normal at this time.  Ultrasound raised question of hydro -> CT discounted that.    ASSESSMENT/RECOMMENDATIONS 1.Renal- Acute kidney injury likely a result of ATN from her group a strep bacteremia and hypotension. She had a period of oliguria - now non-oliguric.  Creatinine however continues to rise. The degree of injury to me seems disproportionate to the insult (BP) and raises question as to something else going on although initial urine not suspicious for GN. Will relook at urine and if sediment active will get additional studies. Given the fact that she is young and had normal renal function at baseline her prognosis for ultimate recovery should be good.  Initially would have predicted only very low risk of possibly requiring dialysis before this resolves itself but if creatinine continues to rise is more likely. Will give small dose (20 mEq) of oral K for K 3.4 2. Hypertension/volume - Hypotension has resolved and she is now normotensive. She is on moderate dose carvedilol. Okay to continue this at this time with parameters. Lasix daily was added to her regimen 6/18.  She will likely auto diurese as well as her kidney function improves.  3. Group A strep bacteremia/wound with  GpA strep and enterobacter, urine with enterococcus - infectious disease is following and she is currently on Rocephin.  4. Anemia -Likely situational in nature. Supportive care for now   Subjective:  Very sleepy Denies SOB Back hurts Not hungry  Objective Vital signs in last 24 hours: Filed Vitals:   01/31/14 0346 01/31/14 0700 01/31/14 0850 01/31/14 1213  BP: 142/81  152/87 122/66  Pulse: 93  90 80  Temp: 98.6 F (37 C) 98.3 F (36.8 C)  98.6 F (37 C)  TempSrc: Oral Oral  Oral  Resp: 19  16 16   Height:      Weight:      SpO2: 96%  95% 97%   Weight change:   Intake/Output Summary (Last 24 hours) at 01/31/14 1307 Last data filed at 01/31/14 1200  Gross per 24 hour  Intake    840 ml  Output   2075 ml  Net  -1235 ml   Physical Exam:  BP 122/66  Pulse 80  Temp(Src) 98.6 F (37 C) (Oral)  Resp 16  Ht 5' 4.96" (1.65 m)  Wt 117.9 kg (259 lb 14.8 oz)  BMI 43.31 kg/m2  SpO2 97%  [name], NAD but quite sleepy VS as noted above Lungs clear No rub Abd soft 1+ edema LE's Clear dilute appearing urine in foley  No asterixus  Recent Labs Lab 01/25/14 0512 01/26/14 0500 01/27/14 0233 01/27/14 0941 01/27/14 2005 01/28/14 1600 01/29/14 0235 01/30/14 0400 01/31/14 0500  NA 141 139 140 142 137 142 143 140 141  K 3.9 3.9  3.8 3.9 4.1 3.7 3.7 3.7 3.4*  CL 106 109 105 106 102 104 104 100 99  CO2 19 17* 18* 20 17* 20 21 23 22   GLUCOSE 208* 197* 135* 156* 254* 175* 201* 147* 182*  BUN 10 19 24* 26* 27* 31* 32* 34* 35*  CREATININE 0.95 2.83* 4.02* 4.45* 4.55* 5.72* 6.09* 6.99* 7.40*  CALCIUM 8.3* 7.1* 7.4* 7.8* 7.6* 8.2* 8.3* 8.5 8.5  PHOS  --  4.0  --   --   --   --   --  6.6* 6.8*    Recent Labs Lab 01/25/14 0512 01/30/14 0400 01/31/14 0500  AST 15  --   --   ALT 12  --   --   ALKPHOS 79  --   --   BILITOT 1.0  --   --   PROT 5.8*  --   --   ALBUMIN 2.0* 1.6* 1.7*    Recent Labs Lab 01/24/14 2111  01/26/14 1115 01/28/14 0229  01/29/14 0235 01/30/14 0400  WBC 19.2*  < > 15.5* 11.4* 10.3 8.7  NEUTROABS 17.2*  --  13.2* 9.6*  --   --   HGB 9.8*  < > 8.4* 8.6* 8.4* 8.2*  HCT 29.6*  < > 25.2* 25.9* 25.3* 25.1*  MCV 84.3  < > 85.4 83.3 83.0 82.8  PLT 262  < > 170 225 223 274  < > = values in this interval not displayed.  Recent Labs Lab 01/25/14 0512  TROPONINI <0.30     Recent Labs Lab 01/30/14 1927 01/30/14 2324 01/31/14 0348 01/31/14 0723 01/31/14 1214  GLUCAP 144* 205* 230* 132* 166*   Studies/Results: Ct Abdomen Pelvis Wo Contrast  01/29/2014   CLINICAL DATA:  LEFT hydronephrosis, history hypertension, coronary artery disease post MI, diabetes, GERD, hyperlipidemia  EXAM: CT ABDOMEN AND PELVIS WITHOUT CONTRAST  TECHNIQUE: Multidetector CT imaging of the abdomen and pelvis was performed following the standard protocol without IV contrast. Oral contrast not administered. Sagittal and coronal MPR images reconstructed from axial data set.  COMPARISON:  01/27/2014 ultrasound abdomen  FINDINGS: BILATERAL pleural effusions and compressive atelectasis of the lower lobes.  Within limits of a nonenhanced exam, liver, spleen, pancreas, kidneys, and adrenal glands normal appearance.  Specifically no evidence of RIGHT hydronephrosis or cystic RIGHT renal mass identified.  No ureteral dilatation.  Uterus and adnexae unremarkable.  Catheter within bladder which is decompressed.  Normal appendix.  Stomach and bowel loops grossly normal for technique.  Prior ventral pelvic surgical scar.  No definite mass, adenopathy, free fluid or inflammatory process.  Duplication of IVC.  Scattered atherosclerotic calcification.  Prior L5-S1 fusion.  IMPRESSION: No acute intra-abdominal or intrapelvic abnormalities.  BILATERAL pleural effusions and lower lobe atelectasis.   Electronically Signed   By: Lavonia Dana M.D.   On: 01/29/2014 15:12   Ct Head Wo Contrast  01/30/2014   CLINICAL DATA:  Headache and tremors  EXAM: CT HEAD WITHOUT  CONTRAST  TECHNIQUE: Contiguous axial images were obtained from the base of the skull through the vertex without intravenous contrast.  COMPARISON:  CT 09/18/2013  FINDINGS: Ventricle size is normal. Negative for acute or chronic infarction. Negative for hemorrhage or fluid collection. Negative for mass or edema. No shift of the midline structures.  Calvarium is intact.  IMPRESSION: Normal   Electronically Signed   By: Franchot Gallo M.D.   On: 01/30/2014 17:18   Dg Chest Port 1 View  01/30/2014   CLINICAL DATA:  Pulmonary  edema  EXAM: PORTABLE CHEST - 1 VIEW  COMPARISON:  January 29, 2014  FINDINGS: There is patchy airspace disease in the bases, stable. There is no appreciable interstitial edema or effusion currently. Heart remains mildly prominent with normal pulmonary vascularity. No adenopathy.  IMPRESSION: There is stable patchy bibasilar airspace disease. Currently there is no edema or effusion. No new opacity.   Electronically Signed   By: Lowella Grip M.D.   On: 01/30/2014 07:38   Medications: . sodium chloride 10 mL/hr at 01/28/14 2300   . atorvastatin  80 mg Per Tube QHS  . carvedilol  12.5 mg Oral BID WC  . cefTRIAXone (ROCEPHIN)  IV  2 g Intravenous Q24H  . furosemide  60 mg Intravenous Daily  . gabapentin  300 mg Oral Daily  . insulin aspart  0-20 Units Subcutaneous 6 times per day  . Liraglutide  1.8 mg Subcutaneous Daily  . pantoprazole  40 mg Oral Daily  . senna  1 tablet Oral BID  . sodium chloride  10-40 mL Intracatheter Q12H  . topiramate  125 mg Per Tube QHS   Jamal Maes, MD [facility] [phone] pager 01/31/2014, 1:07 PM

## 2014-01-31 NOTE — Progress Notes (Signed)
Physical Therapy Treatment Patient Details Name: Marissa [name] MRN: [id number] DOB: June 15, 1976 Today's Date: 01/31/2014    History of Present Illness pt presents after Lumbar I+D with multiple recent back surgeries.      PT Comments    Patient making gains with mobility.  Continues to be incontinent of bowel, limiting session today.  Follow Up Recommendations  Home health PT;Supervision for mobility/OOB     Equipment Recommendations  None recommended by PT    Recommendations for Other Services       Precautions / Restrictions Precautions Precautions: Fall;Back Precaution Comments: Reviewed precautions.   Required Braces or Orthoses: Spinal Brace Spinal Brace: Lumbar corset;Applied in sitting position Restrictions Weight Bearing Restrictions: No    Mobility  Bed Mobility Overal bed mobility: Needs Assistance Bed Mobility: Rolling;Sidelying to Sit Rolling: Supervision Sidelying to sit: Min assist       General bed mobility comments: Reviewed log rolling and moving to sitting.  Assist to support trunk as patient moved to sitting. Good balance once upright.  Min assist to don back brace.  Transfers Overall transfer level: Needs assistance Equipment used: Rolling walker (2 wheeled) Transfers: Sit to/from Stand Sit to Stand: Min assist         General transfer comment: Verbal cues for hand placement.  Assist to rise to standing from bed and toilet.  Ambulation/Gait Ambulation/Gait assistance: Supervision Ambulation Distance (Feet): 60 Feet Assistive device: Rolling walker (2 wheeled) Gait Pattern/deviations: Step-through pattern;Decreased stride length Gait velocity: very decreased Gait velocity interpretation: Below normal speed for age/gender General Gait Details: Patient demonstrates safe use of RW.  Once patient stood, noted BM in bed.  Walked to sink and then to bathroom.  Following completion of BM, patient stood for pericare (by PT) with fairly good  balance.  Provided patient with cuing on toilet on maintaining back precautions during hygiene.  Patient left up in chair with nursing in room.   Stairs            Wheelchair Mobility    Modified Rankin (Stroke Patients Only)       Balance                                    Cognition Arousal/Alertness: Awake/alert Behavior During Therapy: WFL for tasks assessed/performed Overall Cognitive Status: Within Functional Limits for tasks assessed                      Exercises      General Comments        Pertinent Vitals/Pain     Home Living                      Prior Function            PT Goals (current goals can now be found in the care plan section) Progress towards PT goals: Progressing toward goals    Frequency  Min 4X/week    PT Plan Current plan remains appropriate    Co-evaluation             End of Session Equipment Utilized During Treatment: Gait belt;Back brace Activity Tolerance: Patient limited by fatigue;Patient limited by pain (Frustration with incontinence) Patient left: in chair;with call bell/phone within reach;with nursing/sitter in room     Time: 6301-6010 PT Time Calculation (min): 50 min  Charges:  $Gait Training: 8-22 mins $Therapeutic Activity: 23-37 mins  G Codes:      Despina Pole 01/31/2014, 6:04 PM Carita Pian. Sanjuana Kava, [place] Pager [phone]

## 2014-01-31 NOTE — Progress Notes (Signed)
Pt transferred from [place] a/a/o x 4  Initial assessment done denied pain at this time. Will continue to monitor.

## 2014-01-31 NOTE — Progress Notes (Signed)
Marissa [name] - Stepdown/ICU TEAM Progress Note  Marissa [name] LPF:[id number] DOB: 26-Dec-1975 DOA: 01/24/2014 PCP: Marissa [name] NOT IN SYSTEM  Admit HPI / Brief Narrative: 38 yo BF PMHx DM Type 2, chronic diabetic neuropathy,Hx MI, HTN, HLD, Migraine HA, Obesity,   recent admission for lumbar disc herniation s/p redo decompressive lunbar laminectomy presenting with severe back pain and found to have lumbar wound infection. Patient now s/p lumbar wound irrigation and debridement 01/25/2014 and we are consulted for management of mechanical ventilation. Patient extubated on 6/14   HPI/Subjective: 6/20 patient states incontinent of bowel, RN confirms patient has had bowel incontinence today which appears to be new in nature. Patient also complains of new onset tremors in her bilateral upper extremities which appear to be intention tremors in nature, also new symptom. Verifies she had an MI 2006 with cardiac catheterization, negative stents placed. Current (cardiologist) is Dr. Caryl [name] in [place] only recently was started on toes up by her (endocrinologist) Dr. Eddie [name] also at [facility]   Assessment/Plan: Severe sepsis  -due to wound infection with both gram negative and gram positive bacteremia (Enterobacter, pyogenes) , urine positive for enterococcus. -sepsis physiology has resolved   Wound infection/History of lumbar laminectomy  -S./P. reexploration of lumbar wound with I./D. this admission -Continue mild bilateral lower extremity weakness, which has not worsened, reported stool incontinence. -6/20 spoke with Dr.Neelesh [name] (neurosurgery) secondary to patient's complaint of bowel incontinence. Feels that not a major concern in light of the fact that patient still able to ambulate.  Acute respiratory failure with hypoxia -Resolved most likely secondary to patient's sepsis.   Acute Pulmonary edema  -currently stable on RA  -did require mechanical  ventilation this admission but was not prolonged   Acute renal failure/ATN  -Nephrology following and managing Lasix  -Scr continues to rise and did have an oliguric phase but now with adequate UOP  -etiology 2/2 hypotension and strep bacteremia  -6/20 if continues to increase may require dialysis per nephrology   Diabetes mellitus with peripheral neuropathy  -current CBGs controlled on resistant SSI  -Currently do not have proper needles for Victoza injector and patient suffers from GI side effects (just started medication in May 2015). Will discontinue medication while hospitalized -Obtain hemoglobin A1c -Obtain lipid panel  -Neurontin dose decreased by renal due to recent tremors and concerns over accummulated med in setting of low GFR.   HTN (hypertension)  -controlled  -cont Coreg 12.5 mg BID -Continue Lasix per nephrology currently 60 mg daily IV   Obesity   Anemia  -possibly dilutional but suspect acute reactant/transient bone marrow depression due to recent sepsis and critical illness  -ck anemia panel  -baseline ~ 12 and current ~8    Code Status: FULL Family Communication: no family present at time of exam Disposition Plan: Per neurosurgery    Consultants: Dr.Neelesh [name] (Neurosurgery)  Dr. Carlyle Basques (infectious disease) Dr. Jamal Maes (nephrology)   Procedure/Significant Events: 01/16/2014 - Redo decompressive lumbar laminectomy L5-S1 and posterior lumbar interbody fusion  01/21/2014 - Hospital discharge  01/24/2014 - Admission for lumbar wound infection  01/25/2014 - Re-exploration of lumbar wound with irrigation and debridement  01/25/2014 - Extubation  01/27/2014 - Worsening renal failure  01/30/2014 - single lumen midline PICC placed    Culture 6/13 blood left arm [name]/2 positive  Group A strep (S. Pyogenes)  6/14 back wound positive; Enterobacter Aerogenes, Group A strep (S. Pyogenes)  6/14 urine positive; enterococcus species  6/15 blood  left antecubital/ right hand NTD 6/17 C. difficile by PCR negative   Antibiotics: Vancomycin 01/25/2014 >>> stopped 6/15  Zosyn 01/25/2014 >>> stopped 6/18 Clindamycin 6/16>>> stopped 6/17 Ceftriaxone 6/18>>    DVT prophylaxis: SCD   Devices NA   LINES / TUBES:  6/14 Foley catheter 6/19 midline single lumen PICC    Continuous Infusions: . sodium chloride 10 mL/hr at 01/28/14 2300    Objective: VITAL SIGNS: Temp: 98.6 F (37 C) (06/20 1213) Temp src: Oral (06/20 1213) BP: 122/66 mmHg (06/20 1213) Pulse Rate: 80 (06/20 1213) SPO2; FIO2:   Intake/Output Summary (Last 24 hours) at 01/31/14 1458 Last data filed at 01/31/14 1200  Gross per 24 hour  Intake    840 ml  Output   2075 ml  Net  -1235 ml     Exam: General: A./O. x4, NAD, No acute respiratory distress Lungs: Clear to auscultation bilaterally without wheezes or crackles Cardiovascular: Regular rate and rhythm without murmur gallop or rub normal S1 and S2 Abdomen: Morbidly obese, Nontender, nondistended, soft, bowel sounds positive, no rebound, no ascites, no appreciable mass Extremities: No significant cyanosis, clubbing, or edema bilateral lower extremities Neurologic; cranial nerves II through XII intact, patient able to move all extremities, sensation intact, bilateral upper extremity intention tremors, did not ambulate patient however patient and RN confirmed patient able to ambulate to bedside commode and back.  Data Reviewed: Basic Metabolic Panel:  Recent Labs Lab 01/25/14 0512 01/26/14 0500  01/27/14 2005 01/28/14 1600 01/29/14 0235 01/30/14 0400 01/31/14 0500  NA 141 139  < > 137 142 143 140 141  K 3.9 3.9  < > 4.[name] 3.7 3.7 3.7 3.4*  CL 106 109  < > 102 104 104 100 99  CO2 19 17*  < > 17* 20 21 23 22   GLUCOSE 208* 197*  < > 254* 175* 201* 147* 182*  BUN 10 19  < > 27* 31* 32* 34* 35*  CREATININE 0.95 2.83*  < > 4.55* 5.72* 6.09* 6.99* 7.40*  CALCIUM 8.3* 7.[name]*  < > 7.6* 8.2* 8.3* 8.5  8.5  MG  --  [name].5  --   --   --   --   --   --   PHOS  --  4.0  --   --   --   --  6.6* 6.8*  < > = values in this interval not displayed. Liver Function Tests:  Recent Labs Lab 01/25/14 0512 01/30/14 0400 01/31/14 0500  AST 15  --   --   ALT 12  --   --   ALKPHOS 79  --   --   BILITOT [name].0  --   --   PROT 5.8*  --   --   ALBUMIN 2.0* [name].6* [name].7*   No results found for this basename: LIPASE, AMYLASE,  in the last 168 hours No results found for this basename: AMMONIA,  in the last 168 hours CBC:  Recent Labs Lab 01/24/14 2111  01/26/14 0500 01/26/14 1115 01/28/14 0229 01/29/14 0235 01/30/14 0400  WBC 19.2*  < > 15.3* 15.5* 11.4* 10.3 8.7  NEUTROABS 17.2*  --   --  13.2* 9.6*  --   --   HGB 9.8*  < > 8.4* 8.4* 8.6* 8.4* 8.2*  HCT 29.6*  < > 25.8* 25.2* 25.9* 25.3* 25.[name]*  MCV 84.3  < > 85.7 85.4 83.3 83.0 82.8  PLT 262  < > 167 170 225 223 274  < > = values  in this interval not displayed. Cardiac Enzymes:  Recent Labs Lab 01/25/14 0512  TROPONINI <0.30   BNP (last 3 results)  Recent Labs  10/31/13 1446  PROBNP 253.7*   CBG:  Recent Labs Lab 01/30/14 1927 01/30/14 2324 01/31/14 0348 01/31/14 0723 01/31/14 1214  GLUCAP 144* 205* 230* 132* 166*    Recent Results (from the past 240 hour(s))  CULTURE, BLOOD (ROUTINE X 2)     Status: None   Collection Time    01/24/14  9:11 PM      Result Value Ref Range Status   Specimen Description BLOOD LEFT ARM   Final   Special Requests BOTTLES DRAWN AEROBIC AND ANAEROBIC 10CC EACH   Final   Culture  Setup Time     Final   Value: 01/25/2014 00:41     Performed at [facility]   Culture     Final   Value: GROUP A STREP (S.PYOGENES) ISOLATED     Note: CRITICAL RESULT CALLED TO, READ BACK BY AND VERIFIED WITH: JOHN THOMASSON 12/27/13 0850 BY SMITHERSJ     Note: Gram Stain Report Called to,Read Back By and Verified With: THOMAS LILLEY 01/25/14 @ 8:14PM BY RUSCOE A.     Performed at [facility]   Report  Status PENDING   Incomplete  CULTURE, BLOOD (ROUTINE X 2)     Status: None   Collection Time    01/24/14 10:41 PM      Result Value Ref Range Status   Specimen Description BLOOD LEFT ARM   Final   Special Requests BOTTLES DRAWN AEROBIC AND ANAEROBIC [facility] EACH   Final   Culture  Setup Time     Final   Value: 01/25/2014 14:08     Performed at [facility]   Culture     Final   Value: NO GROWTH 5 DAYS     Performed at [facility]   Report Status 01/31/2014 FINAL   Final  WOUND CULTURE     Status: None   Collection Time    01/25/14  [name]:48 AM      Result Value Ref Range Status   Specimen Description WOUND BACK   Final   Special Requests NONE   Final   Gram Stain     Final   Value: ABUNDANT WBC PRESENT,BOTH PMN AND MONONUCLEAR     FEW GRAM POSITIVE COCCI IN PAIRS     RARE GRAM NEGATIVE RODS     Gram Stain Report Called to,Read Back By and Verified With: Gram Stain Report Called to,Read Back By and Verified With: Denyse Amass RN [phone] BY GREEN R Performed at [facility]     Performed at [facility]   Culture     Final   Value: MODERATE ENTEROBACTER AEROGENES     ABUNDANT GROUP A STREP (S.PYOGENES) ISOLATED     Performed at [facility]   Report Status 01/27/2014 FINAL   Final   Organism ID, Bacteria ENTEROBACTER AEROGENES   Final  GRAM STAIN     Status: None   Collection Time    01/25/14  [name]:48 AM      Result Value Ref Range Status   Specimen Description WOUND BACK   Final   Special Requests NONE   Final   Gram Stain     Final   Value: ABUNDANT WBC PRESENT,BOTH PMN AND MONONUCLEAR     FEW GRAM POSITIVE COCCI IN PAIRS     RARE  GRAM NEGATIVE RODS     Gram Stain Report Called to,Read Back By and Verified With: Denyse Amass RN [phone] GREEN R   Report Status 01/25/2014 FINAL   Final  MRSA PCR SCREENING     Status: None   Collection Time    01/25/14  3:05 AM      Result Value Ref Range Status   MRSA by PCR NEGATIVE  NEGATIVE Final    Comment:            The GeneXpert MRSA Assay (FDA     approved for NASAL specimens     only), is one component of a     comprehensive MRSA colonization     surveillance program. It is not     intended to diagnose MRSA     infection nor to guide or     monitor treatment for     MRSA infections.  URINE CULTURE     Status: None   Collection Time    01/25/14  3:30 AM      Result Value Ref Range Status   Specimen Description URINE, CATHETERIZED   Final   Special Requests NONE   Final   Culture  Setup Time     Final   Value: 01/25/2014 14:15     Performed at [place]     Final   Value: >=100,000 COLONIES/ML     Performed at [facility]   Culture     Final   Value: ENTEROCOCCUS SPECIES     Performed at [facility]   Report Status 01/27/2014 FINAL   Final   Organism ID, Bacteria ENTEROCOCCUS SPECIES   Final  CULTURE, BLOOD (ROUTINE X 2)     Status: None   Collection Time    01/26/14  9:50 AM      Result Value Ref Range Status   Specimen Description BLOOD LEFT ANTECUBITAL   Final   Special Requests BOTTLES DRAWN AEROBIC ONLY 3CC   Final   Culture  Setup Time     Final   Value: 01/26/2014 13:59     Performed at [facility]   Culture     Final   Value:        BLOOD CULTURE RECEIVED NO GROWTH TO DATE CULTURE WILL BE HELD FOR 5 DAYS BEFORE ISSUING A FINAL NEGATIVE REPORT     Performed at [facility]   Report Status PENDING   Incomplete  CULTURE, BLOOD (ROUTINE X 2)     Status: None   Collection Time    01/26/14 10:00 AM      Result Value Ref Range Status   Specimen Description BLOOD RIGHT HAND   Final   Special Requests BOTTLES DRAWN AEROBIC ONLY 2CC   Final   Culture  Setup Time     Final   Value: 01/26/2014 13:58     Performed at [facility]   Culture     Final   Value:        BLOOD CULTURE RECEIVED NO GROWTH TO DATE CULTURE WILL BE HELD FOR 5 DAYS BEFORE ISSUING A FINAL NEGATIVE REPORT     Performed at  [facility]   Report Status PENDING   Incomplete  CLOSTRIDIUM DIFFICILE BY PCR     Status: None   Collection Time    01/28/14  9:35 PM      Result Value Ref Range Status   C difficile by  pcr NEGATIVE  NEGATIVE Final     Studies:  Recent x-ray studies have been reviewed in detail by the Attending Physician  Scheduled Meds:  Scheduled Meds: . atorvastatin  80 mg Per Tube QHS  . carvedilol  12.5 mg Oral BID WC  . cefTRIAXone (ROCEPHIN)  IV  2 g Intravenous Q24H  . furosemide  60 mg Intravenous Daily  . gabapentin  300 mg Oral Daily  . insulin aspart  0-20 Units Subcutaneous 6 times per day  . Liraglutide  [name].8 mg Subcutaneous Daily  . pantoprazole  40 mg Oral Daily  . senna  [name] tablet Oral BID  . sodium chloride  10-40 mL Intracatheter Q12H  . topiramate  125 mg Per Tube QHS    Time spent on care of this patient: 40 mins   Allie Bossier , MD   [facility] Office  [phone] Pager [phone]  On-Call/Text Page:      [name]      password [email or web address]  If 7PM-7AM, please contact night-coverage [email or web address] Password [email or web address] 01/31/2014, 2:58 PM   LOS: 7 days

## 2014-02-01 ENCOUNTER — Inpatient Hospital Stay (HOSPITAL_COMMUNITY): Payer: Medicare HMO

## 2014-02-01 DIAGNOSIS — R110 Nausea: Secondary | ICD-10-CM

## 2014-02-01 DIAGNOSIS — I10 Essential (primary) hypertension: Secondary | ICD-10-CM | POA: Diagnosis not present

## 2014-02-01 DIAGNOSIS — R109 Unspecified abdominal pain: Secondary | ICD-10-CM | POA: Diagnosis not present

## 2014-02-01 DIAGNOSIS — E1129 Type 2 diabetes mellitus with other diabetic kidney complication: Secondary | ICD-10-CM | POA: Diagnosis not present

## 2014-02-01 LAB — GLUCOSE, CAPILLARY
GLUCOSE-CAPILLARY: 86 mg/dL (ref 70–99)
GLUCOSE-CAPILLARY: 99 mg/dL (ref 70–99)
GLUCOSE-CAPILLARY: 97 mg/dL (ref 70–99)
Glucose-Capillary: 112 mg/dL — ABNORMAL HIGH (ref 70–99)
Glucose-Capillary: 101 mg/dL — ABNORMAL HIGH (ref 70–99)
Glucose-Capillary: 113 mg/dL — ABNORMAL HIGH (ref 70–99)

## 2014-02-01 LAB — CULTURE, BLOOD (ROUTINE X 2)
Culture: NO GROWTH
Culture: NO GROWTH

## 2014-02-01 LAB — LIPID PANEL
Cholesterol: 115 mg/dL (ref 0–200)
HDL: 25 mg/dL — AB (ref >39)
LDL CALC: 67 mg/dL (ref 0–99)
TRIGLYCERIDES: 117 mg/dL (ref <150)
Total CHOL/HDL Ratio: 4.6 RATIO
VLDL: 23 mg/dL (ref 0–40)

## 2014-02-01 LAB — COMPREHENSIVE METABOLIC PANEL
ALK PHOS: 86 U/L (ref 39–117)
ALT: 8 U/L (ref 0–35)
AST: 31 U/L (ref 0–37)
Albumin: 1.7 g/dL — ABNORMAL LOW (ref 3.5–5.2)
BUN: 36 mg/dL — ABNORMAL HIGH (ref 6–23)
CO2: 22 mEq/L (ref 19–32)
Calcium: 8.6 mg/dL (ref 8.4–10.5)
Chloride: 104 mEq/L (ref 96–112)
Creatinine, Ser: 7.85 mg/dL — ABNORMAL HIGH (ref 0.50–1.10)
GFR calc Af Amer: 7 mL/min — ABNORMAL LOW (ref >90)
GFR calc non Af Amer: 6 mL/min — ABNORMAL LOW (ref >90)
Glucose, Bld: 97 mg/dL (ref 70–99)
POTASSIUM: 3.6 meq/L — AB (ref 3.7–5.3)
SODIUM: 145 meq/L (ref 137–147)
TOTAL PROTEIN: 6.5 g/dL (ref 6.0–8.3)
Total Bilirubin: <0.2 mg/dL — ABNORMAL LOW (ref 0.3–1.2)

## 2014-02-01 LAB — CBC WITH DIFFERENTIAL/PLATELET
Basophils Absolute: 0 10*3/uL (ref 0.0–0.1)
Basophils Relative: 0 % (ref 0–1)
Eosinophils Absolute: 0.3 10*3/uL (ref 0.0–0.7)
Eosinophils Relative: 4 % (ref 0–5)
HCT: 25.1 % — ABNORMAL LOW (ref 36.0–46.0)
Hemoglobin: 8.1 g/dL — ABNORMAL LOW (ref 12.0–15.0)
Lymphocytes Relative: 16 % (ref 12–46)
Lymphs Abs: 1.2 10*3/uL (ref 0.7–4.0)
MCH: 26.6 pg (ref 26.0–34.0)
MCHC: 32.3 g/dL (ref 30.0–36.0)
MCV: 82.6 fL (ref 78.0–100.0)
Monocytes Absolute: 0.7 10*3/uL (ref 0.1–1.0)
Monocytes Relative: 9 % (ref 3–12)
NEUTROS PCT: 71 % (ref 43–77)
Neutro Abs: 5.3 10*3/uL (ref 1.7–7.7)
PLATELETS: 353 10*3/uL (ref 150–400)
RBC: 3.04 MIL/uL — ABNORMAL LOW (ref 3.87–5.11)
RDW: 14.4 % (ref 11.5–15.5)
WBC: 7.5 10*3/uL (ref 4.0–10.5)

## 2014-02-01 LAB — HEMOGLOBIN A1C
Hgb A1c MFr Bld: 9.5 % — ABNORMAL HIGH (ref <5.7)
MEAN PLASMA GLUCOSE: 226 mg/dL — AB (ref <117)

## 2014-02-01 LAB — MAGNESIUM: MAGNESIUM: 2.3 mg/dL (ref 1.5–2.5)

## 2014-02-01 MED ORDER — INSULIN ASPART 100 UNIT/ML ~~LOC~~ SOLN
0.0000 [IU] | Freq: Three times a day (TID) | SUBCUTANEOUS | Status: DC
  Administered 2014-02-02: 3 [IU] via SUBCUTANEOUS
  Administered 2014-02-03: 4 [IU] via SUBCUTANEOUS
  Administered 2014-02-03 – 2014-02-04 (×2): 3 [IU] via SUBCUTANEOUS
  Administered 2014-02-04: 4 [IU] via SUBCUTANEOUS
  Administered 2014-02-05: 7 [IU] via SUBCUTANEOUS

## 2014-02-01 MED ORDER — CALCIUM CARBONATE ANTACID 500 MG PO CHEW
1.0000 | CHEWABLE_TABLET | Freq: Four times a day (QID) | ORAL | Status: DC | PRN
  Administered 2014-02-01 – 2014-02-04 (×3): 200 mg via ORAL
  Filled 2014-02-01 (×4): qty 1

## 2014-02-01 NOTE — Progress Notes (Signed)
Chart reviewed.   [facility] PROGRESS NOTE  Marissa [name] GBT:[id number] DOB: June 14, 1976 DOA: 01/24/2014 PCP: PROVIDER NOT IN SYSTEM  Assessment/Plan:    DM (diabetes mellitus), type 2 with renal complications:  CBGs controlled. Continue current    Nausea alone: refusing antiemetics.  NS has ordered abdominal films    Abdominal pain, lower:  Repeat UA negative.  C diff negative.  Refusing pain medications. abd films pending    Wound infection: group a strep and enterobacter. per ID    Severe sepsis with group A strep bacteremia: per ID   Acute renal failure: per renal    History of lumbar laminectomy     Obesity    Anemia    HTN (hypertension): refusing coreg  HPI/Subjective: C/o nausea and lower abd pain. Incontinent of stool  Objective: Filed Vitals:   02/01/14 1020  BP: 145/75  Pulse: 97  Temp: 98.6 F (37 C)  Resp: 16    Intake/Output Summary (Last 24 hours) at 02/01/14 1111 Last data filed at 01/31/14 1706  Gross per 24 hour  Intake    290 ml  Output   1075 ml  Net   -785 ml   Filed Weights   01/25/14 0500 01/31/14 1754  Weight: 117.9 kg (259 lb 14.8 oz) 123.424 kg (272 lb 1.6 oz)    Exam:   General:  Uncomfortable. oriented  Cardiovascular: RRR without MGR  Respiratory: CTA without WRR  Abdomen: S, NT, ND  Ext: No CCE  Basic Metabolic Panel:  Recent Labs Lab 01/26/14 0500  01/28/14 1600 01/29/14 0235 01/30/14 0400 01/31/14 0500 02/01/14 0429  NA 139  < > 142 143 140 141 145  K 3.9  < > 3.7 3.7 3.7 3.4* 3.6*  CL 109  < > 104 104 100 99 104  CO2 17*  < > 20 21 23 22 22   GLUCOSE 197*  < > 175* 201* 147* 182* 97  BUN 19  < > 31* 32* 34* 35* 36*  CREATININE 2.83*  < > 5.72* 6.09* 6.99* 7.40* 7.85*  CALCIUM 7.1*  < > 8.2* 8.3* 8.5 8.5 8.6  MG 1.5  --   --   --   --   --  2.3  PHOS 4.0  --   --   --  6.6* 6.8*  --   < > = values in this interval not displayed. Liver Function Tests:  Recent Labs Lab 01/30/14 0400  01/31/14 0500 02/01/14 0429  AST  --   --  31  ALT  --   --  8  ALKPHOS  --   --  86  BILITOT  --   --  <0.2*  PROT  --   --  6.5  ALBUMIN 1.6* 1.7* 1.7*   No results found for this basename: LIPASE, AMYLASE,  in the last 168 hours No results found for this basename: AMMONIA,  in the last 168 hours CBC:  Recent Labs Lab 01/26/14 1115 01/28/14 0229 01/29/14 0235 01/30/14 0400 02/01/14 0429  WBC 15.5* 11.4* 10.3 8.7 7.5  NEUTROABS 13.2* 9.6*  --   --  5.3  HGB 8.4* 8.6* 8.4* 8.2* 8.1*  HCT 25.2* 25.9* 25.3* 25.1* 25.1*  MCV 85.4 83.3 83.0 82.8 82.6  PLT 170 225 223 274 353   Cardiac Enzymes: No results found for this basename: CKTOTAL, CKMB, CKMBINDEX, TROPONINI,  in the last 168 hours BNP (last 3 results)  Recent Labs  10/31/13 1446  PROBNP 253.7*   CBG:  Recent Labs Lab 01/31/14 1542 01/31/14 1813 01/31/14 1953 02/01/14 0418 02/01/14 0759  GLUCAP 135* 112* 101* 86 101*    Recent Results (from the past 240 hour(s))  CULTURE, BLOOD (ROUTINE X 2)     Status: None   Collection Time    01/24/14  9:11 PM      Result Value Ref Range Status   Specimen Description BLOOD LEFT ARM   Final   Special Requests BOTTLES DRAWN AEROBIC AND ANAEROBIC 10CC EACH   Final   Culture  Setup Time     Final   Value: 01/25/2014 00:41     Performed at [facility]   Culture     Final   Value: GROUP A STREP (S.PYOGENES) ISOLATED     Note: CRITICAL RESULT CALLED TO, READ BACK BY AND VERIFIED WITH: JOHN THOMASSON 12/27/13 0850 BY SMITHERSJ     Note: Gram Stain Report Called to,Read Back By and Verified With: THOMAS LILLEY 01/25/14 @ 8:14PM BY RUSCOE A.     Performed at [facility]   Report Status PENDING   Incomplete  CULTURE, BLOOD (ROUTINE X 2)     Status: None   Collection Time    01/24/14 10:41 PM      Result Value Ref Range Status   Specimen Description BLOOD LEFT ARM   Final   Special Requests BOTTLES DRAWN AEROBIC AND ANAEROBIC [facility] EACH   Final   Culture   Setup Time     Final   Value: 01/25/2014 14:08     Performed at [facility]   Culture     Final   Value: NO GROWTH 5 DAYS     Performed at [facility]   Report Status 01/31/2014 FINAL   Final  WOUND CULTURE     Status: None   Collection Time    01/25/14  1:48 AM      Result Value Ref Range Status   Specimen Description WOUND BACK   Final   Special Requests NONE   Final   Gram Stain     Final   Value: ABUNDANT WBC PRESENT,BOTH PMN AND MONONUCLEAR     FEW GRAM POSITIVE COCCI IN PAIRS     RARE GRAM NEGATIVE RODS     Gram Stain Report Called to,Read Back By and Verified With: Gram Stain Report Called to,Read Back By and Verified With: Denyse Amass RN [phone] BY GREEN R Performed at [facility]     Performed at [facility]   Culture     Final   Value: MODERATE ENTEROBACTER AEROGENES     ABUNDANT GROUP A STREP (S.PYOGENES) ISOLATED     Performed at [facility]   Report Status 01/27/2014 FINAL   Final   Organism ID, Bacteria ENTEROBACTER AEROGENES   Final  GRAM STAIN     Status: None   Collection Time    01/25/14  1:48 AM      Result Value Ref Range Status   Specimen Description WOUND BACK   Final   Special Requests NONE   Final   Gram Stain     Final   Value: ABUNDANT WBC PRESENT,BOTH PMN AND MONONUCLEAR     FEW GRAM POSITIVE COCCI IN PAIRS     RARE GRAM NEGATIVE RODS     Gram Stain Report Called to,Read Back By and Verified With: Denyse Amass RN [phone] GREEN R   Report Status 01/25/2014 FINAL   Final  MRSA  PCR SCREENING     Status: None   Collection Time    01/25/14  3:05 AM      Result Value Ref Range Status   MRSA by PCR NEGATIVE  NEGATIVE Final   Comment:            The GeneXpert MRSA Assay (FDA     approved for NASAL specimens     only), is one component of a     comprehensive MRSA colonization     surveillance program. It is not     intended to diagnose MRSA     infection nor to guide or     monitor treatment for      MRSA infections.  URINE CULTURE     Status: None   Collection Time    01/25/14  3:30 AM      Result Value Ref Range Status   Specimen Description URINE, CATHETERIZED   Final   Special Requests NONE   Final   Culture  Setup Time     Final   Value: 01/25/2014 14:15     Performed at [facility]     Final   Value: >=100,000 COLONIES/ML     Performed at [facility]   Culture     Final   Value: ENTEROCOCCUS SPECIES     Performed at [facility]   Report Status 01/27/2014 FINAL   Final   Organism ID, Bacteria ENTEROCOCCUS SPECIES   Final  CULTURE, BLOOD (ROUTINE X 2)     Status: None   Collection Time    01/26/14  9:50 AM      Result Value Ref Range Status   Specimen Description BLOOD LEFT ANTECUBITAL   Final   Special Requests BOTTLES DRAWN AEROBIC ONLY 3CC   Final   Culture  Setup Time     Final   Value: 01/26/2014 13:59     Performed at [facility]   Culture     Final   Value: NO GROWTH 5 DAYS     Performed at [facility]   Report Status 02/01/2014 FINAL   Final  CULTURE, BLOOD (ROUTINE X 2)     Status: None   Collection Time    01/26/14 10:00 AM      Result Value Ref Range Status   Specimen Description BLOOD RIGHT HAND   Final   Special Requests BOTTLES DRAWN AEROBIC ONLY 2CC   Final   Culture  Setup Time     Final   Value: 01/26/2014 13:58     Performed at [facility]   Culture     Final   Value: NO GROWTH 5 DAYS     Performed at [facility]   Report Status 02/01/2014 FINAL   Final  CLOSTRIDIUM DIFFICILE BY PCR     Status: None   Collection Time    01/28/14  9:35 PM      Result Value Ref Range Status   C difficile by pcr NEGATIVE  NEGATIVE Final     Studies: Ct Head Wo Contrast  01/30/2014   CLINICAL DATA:  Headache and tremors  EXAM: CT HEAD WITHOUT CONTRAST  TECHNIQUE: Contiguous axial images were obtained from the base of the skull through the vertex without intravenous contrast.   COMPARISON:  CT 09/18/2013  FINDINGS: Ventricle size is normal. Negative for acute or chronic infarction. Negative for hemorrhage or fluid collection. Negative for mass or edema. No shift  of the midline structures.  Calvarium is intact.  IMPRESSION: Normal   Electronically Signed   By: Franchot Gallo M.D.   On: 01/30/2014 17:18    Scheduled Meds: . atorvastatin  80 mg Per Tube QHS  . carvedilol  12.5 mg Oral BID WC  . cefTRIAXone (ROCEPHIN)  IV  2 g Intravenous Q24H  . furosemide  60 mg Intravenous Daily  . gabapentin  300 mg Oral Daily  . insulin aspart  0-20 Units Subcutaneous 6 times per day  . pantoprazole  40 mg Oral Daily  . senna  1 tablet Oral BID  . sodium chloride  10-40 mL Intracatheter Q12H  . topiramate  125 mg Per Tube QHS   Continuous Infusions: . sodium chloride 10 mL/hr at 01/28/14 2300    Time spent: 35 minutes  [facility] Pager [phone]. If 7PM-7AM, please contact night-coverage at [email or web address], password [facility] 02/01/2014, 11:11 AM  LOS: 8 days

## 2014-02-01 NOTE — Progress Notes (Signed)
No issues overnight. Pt does c/o abdominal pain this am. Says she has diarrhea, and was unable to tell if she was passing stool or flatus leading to "incontinence" yesterday. She denies any new numbness/tingling/weakness.  EXAM:  BP 170/82  Pulse 103  Temp(Src) 98.2 F (36.8 C) (Oral)  Resp 16  Ht 5\' 5"  (1.651 m)  Wt 123.424 kg (272 lb 1.6 oz)  BMI 45.28 kg/m2  SpO2 96%  Awake, alert, oriented  Speech fluent, appropriate  CN grossly intact  Good strength BLE Abd diffusely TTP, no rebound tenderness  IMPRESSION:  38 y.o. female with sepsis - Neurologically stable - Non-oliguric AKI, Cr continues to rise - Abd pain - labs this am inc AST/ALT/WBC ok  PLAN: - Cont abx - Ceftriaxone - Monitor I/O, Cr. Appreciate nephrology assistance - Will check abd xray

## 2014-02-01 NOTE — Progress Notes (Signed)
Checked on patient this morning. Pt c/o of severe abdominal pain stating it was indigestion. Notified MD for order. Pt requested pain medicine as well. Brought tums and oxy IR into room. After scanning and opening medicines, pt refused to take them. Stated she was in too much discomfort for pills. Wasted meds in sharps in med room. Pt again refused vitals. Allowed foley hygiene to be done.

## 2014-02-01 NOTE — Progress Notes (Signed)
Patient refused to allow vitals to be taken. Permitted blood sugar check and for lab to take blood. Will continue to try again later.

## 2014-02-01 NOTE — Progress Notes (Signed)
BACKGROUND [name] with a background of DM, HTN, normal renal function at baseline (0.95). S/p L5S1 decompression procedure 6/5. Readmitted  6/13 with pain, fever, draining wound - had irrigation and debridement of wound with "vancomycin powder" placed in the deep wound space and was started on vanco and zosyn. Hypotension 6/14 into the 80's but no pressors ever required. Blood cultures  + for group A strep. Wound + Group A strep and enterobacter, urine + enterococcus.  Over 24 hours 6/14->6/15 rapid rise in creatinine from 0.95 to 2.8 and subsequently to 6 on 6/18 and it continues to rise. Remains non-oliguric,   Blood pressures soft through the 16th but normal at this time.  Ultrasound raised question of hydro -> CT discounted that.    ASSESSMENT/RECOMMENDATIONS 1.Renal- Acute kidney injury likely a result of ATN from group a strep bacteremia and hypotension. Had a period of oliguria - now non-oliguric.  Creatinine however continues to rise - 7.85 today.  The degree of injury seems disproportionate to the insult (BP) and raises question as to something else going on although initial urine not suspicious for GN, and repeat UA 6/20 equally as underwhelming.  Given fact that she is young and had normal renal function at baseline her prognosis for ultimate recovery should be good.  Initially would have predicted only very low risk of possibly requiring dialysis before this resolves itself but if creatinine continues to rise is more likely.  2. Hypertension/volume - Hypotension has resolved and she is now normotensive. On moderate dose carvedilol. Okay to continue this at this time with parameters. Lasix daily was added to her regimen 6/18.  She will likely auto diurese as well as her kidney function improves.  3. Group A strep bacteremia/wound with GpA strep and enterobacter, urine with enterococcus - ID is following and she is currently on Rocephin.  4. Anemia -Likely situational in nature. Supportive  care for now   Subjective:  Not hungry (says was nauseous even PTA - tough to sort out if any effects uremia  Objective Vital signs in last 24 hours: Filed Vitals:   01/31/14 2128 02/01/14 0804 02/01/14 1020 02/01/14 1359  BP: 171/99 170/82 145/75 140/76  Pulse: 89 103 97 96  Temp:  98.2 F (36.8 C) 98.6 F (37 C) 98.3 F (36.8 C)  TempSrc: Oral Oral Oral Oral  Resp: 16 16 16 16   Height:      Weight:      SpO2: 96% 96% 94% 97%   Weight change:   Intake/Output Summary (Last 24 hours) at 02/01/14 1559 Last data filed at 02/01/14 1300  Gross per 24 hour  Intake      0 ml  Output   3250 ml  Net  -3250 ml   Physical Exam:  BP 140/76  Pulse 96  Temp(Src) 98.3 F (36.8 C) (Oral)  Resp 16  Ht 5\' 5"  (1.651 m)  Wt 123.424 kg (272 lb 1.6 oz)  BMI 45.28 kg/m2  SpO2 97%  [name], NAD but quite sleepy VS as noted above Lungs clear No rub Abd soft 1+ edema LE's Clear dilute appearing urine in foley  No asterixus  Recent Labs Lab 01/26/14 0500  01/27/14 0941 01/27/14 2005 01/28/14 1600 01/29/14 0235 01/30/14 0400 01/31/14 0500 02/01/14 0429  NA 139  < > 142 137 142 143 140 141 145  K 3.9  < > 3.9 4.1 3.7 3.7 3.7 3.4* 3.6*  CL 109  < > 106 102 104  104 100 99 104  CO2 17*  < > 20 17* 20 21 23 22 22   GLUCOSE 197*  < > 156* 254* 175* 201* 147* 182* 97  BUN 19  < > 26* 27* 31* 32* 34* 35* 36*  CREATININE 2.83*  < > 4.45* 4.55* 5.72* 6.09* 6.99* 7.40* 7.85*  CALCIUM 7.1*  < > 7.8* 7.6* 8.2* 8.3* 8.5 8.5 8.6  PHOS 4.0  --   --   --   --   --  6.6* 6.8*  --   < > = values in this interval not displayed.  Recent Labs Lab 01/30/14 0400 01/31/14 0500 02/01/14 0429  AST  --   --  31  ALT  --   --  8  ALKPHOS  --   --  86  BILITOT  --   --  <0.2*  PROT  --   --  6.5  ALBUMIN 1.6* 1.7* 1.7*    Recent Labs Lab 01/26/14 1115 01/28/14 0229 01/29/14 0235 01/30/14 0400 02/01/14 0429  WBC 15.5* 11.4* 10.3 8.7 7.5  NEUTROABS 13.2* 9.6*  --   --  5.3   HGB 8.4* 8.6* 8.4* 8.2* 8.1*  HCT 25.2* 25.9* 25.3* 25.1* 25.1*  MCV 85.4 83.3 83.0 82.8 82.6  PLT 170 225 223 274 353   Recent Labs Lab 01/31/14 1813 01/31/14 1953 02/01/14 0418 02/01/14 0759 02/01/14 1145  GLUCAP 112* 101* 86 101* 99   Studies/Results: Ct Head Wo Contrast  01/30/2014   CLINICAL DATA:  Headache and tremors  EXAM: CT HEAD WITHOUT CONTRAST  TECHNIQUE: Contiguous axial images were obtained from the base of the skull through the vertex without intravenous contrast.  COMPARISON:  CT 09/18/2013  FINDINGS: Ventricle size is normal. Negative for acute or chronic infarction. Negative for hemorrhage or fluid collection. Negative for mass or edema. No shift of the midline structures.  Calvarium is intact.  IMPRESSION: Normal   Electronically Signed   By: Franchot Gallo M.D.   On: 01/30/2014 17:18   Dg Abd Acute W/chest  02/01/2014   CLINICAL DATA:  Abd pain  EXAM: ACUTE ABDOMEN SERIES (ABDOMEN 2 VIEW & CHEST 1 VIEW)  COMPARISON:  Portal chest radiograph 01/30/2014  FINDINGS: There is no evidence of dilated bowel loops or free intraperitoneal air. No radiopaque calculi or other significant radiographic abnormality is seen. The heart is enlarged. Left central venous catheter via subclavian approach tip superior vena cava. Persistent density left lung base. No acute osseous abnormalities. Decreased density right lung base.  IMPRESSION: Persistent airspace disease in left lung base. Improved disease right lung base. Nonobstructive bowel gas pattern. No evidence of free air.   Electronically Signed   By: Margaree Mackintosh M.D.   On: 02/01/2014 14:42   Medications: . sodium chloride 10 mL/hr at 01/28/14 2300   . atorvastatin  80 mg Per Tube QHS  . carvedilol  12.5 mg Oral BID WC  . cefTRIAXone (ROCEPHIN)  IV  2 g Intravenous Q24H  . furosemide  60 mg Intravenous Daily  . gabapentin  300 mg Oral Daily  . insulin aspart  0-20 Units Subcutaneous TID WC  . pantoprazole  40 mg Oral Daily  .  senna  1 tablet Oral BID  . sodium chloride  10-40 mL Intracatheter Q12H  . topiramate  125 mg Per Tube QHS   Jamal Maes, MD [facility] [phone] pager 02/01/2014, 3:59 PM

## 2014-02-01 NOTE — Progress Notes (Signed)
Patient refuses to allow blood sugars and vital signs to be checked. After multiple attempts she was noncompliant. Pt requests to be left alone to sleep. Will try again in the morning.

## 2014-02-02 ENCOUNTER — Inpatient Hospital Stay (HOSPITAL_COMMUNITY): Payer: Medicare HMO

## 2014-02-02 DIAGNOSIS — I10 Essential (primary) hypertension: Secondary | ICD-10-CM | POA: Diagnosis not present

## 2014-02-02 DIAGNOSIS — T8140XA Infection following a procedure, unspecified, initial encounter: Secondary | ICD-10-CM | POA: Diagnosis not present

## 2014-02-02 DIAGNOSIS — T368X5A Adverse effect of other systemic antibiotics, initial encounter: Secondary | ICD-10-CM

## 2014-02-02 DIAGNOSIS — E1129 Type 2 diabetes mellitus with other diabetic kidney complication: Secondary | ICD-10-CM | POA: Diagnosis not present

## 2014-02-02 DIAGNOSIS — A4159 Other Gram-negative sepsis: Secondary | ICD-10-CM

## 2014-02-02 LAB — GLUCOSE, CAPILLARY
GLUCOSE-CAPILLARY: 110 mg/dL — AB (ref 70–99)
GLUCOSE-CAPILLARY: 238 mg/dL — AB (ref 70–99)
Glucose-Capillary: 142 mg/dL — ABNORMAL HIGH (ref 70–99)
Glucose-Capillary: 106 mg/dL — ABNORMAL HIGH (ref 70–99)

## 2014-02-02 LAB — CBC WITH DIFFERENTIAL/PLATELET
BASOS ABS: 0 10*3/uL (ref 0.0–0.1)
BASOS PCT: 0 % (ref 0–1)
EOS ABS: 0.2 10*3/uL (ref 0.0–0.7)
EOS PCT: 2 % (ref 0–5)
HCT: 26 % — ABNORMAL LOW (ref 36.0–46.0)
Hemoglobin: 8.7 g/dL — ABNORMAL LOW (ref 12.0–15.0)
Lymphocytes Relative: 10 % — ABNORMAL LOW (ref 12–46)
Lymphs Abs: 0.9 10*3/uL (ref 0.7–4.0)
MCH: 27.4 pg (ref 26.0–34.0)
MCHC: 33.5 g/dL (ref 30.0–36.0)
MCV: 82 fL (ref 78.0–100.0)
Monocytes Absolute: 0.8 10*3/uL (ref 0.1–1.0)
Monocytes Relative: 8 % (ref 3–12)
NEUTROS PCT: 80 % — AB (ref 43–77)
Neutro Abs: 7.4 10*3/uL (ref 1.7–7.7)
Platelets: 405 10*3/uL — ABNORMAL HIGH (ref 150–400)
RBC: 3.17 MIL/uL — ABNORMAL LOW (ref 3.87–5.11)
RDW: 14 % (ref 11.5–15.5)
WBC: 9.3 10*3/uL (ref 4.0–10.5)

## 2014-02-02 LAB — CULTURE, BLOOD (ROUTINE X 2)

## 2014-02-02 LAB — COMPREHENSIVE METABOLIC PANEL
ALBUMIN: 1.9 g/dL — AB (ref 3.5–5.2)
ALT: 11 U/L (ref 0–35)
AST: 38 U/L — AB (ref 0–37)
Alkaline Phosphatase: 87 U/L (ref 39–117)
BUN: 38 mg/dL — ABNORMAL HIGH (ref 6–23)
CALCIUM: 8.6 mg/dL (ref 8.4–10.5)
CO2: 23 mEq/L (ref 19–32)
Chloride: 102 mEq/L (ref 96–112)
Creatinine, Ser: 7.71 mg/dL — ABNORMAL HIGH (ref 0.50–1.10)
GFR calc non Af Amer: 6 mL/min — ABNORMAL LOW (ref >90)
GFR, EST AFRICAN AMERICAN: 7 mL/min — AB (ref >90)
GLUCOSE: 116 mg/dL — AB (ref 70–99)
POTASSIUM: 3.6 meq/L — AB (ref 3.7–5.3)
SODIUM: 143 meq/L (ref 137–147)
TOTAL PROTEIN: 6.7 g/dL (ref 6.0–8.3)
Total Bilirubin: <0.2 mg/dL — ABNORMAL LOW (ref 0.3–1.2)

## 2014-02-02 LAB — MAGNESIUM: Magnesium: 2.5 mg/dL (ref 1.5–2.5)

## 2014-02-02 MED ORDER — GADOBENATE DIMEGLUMINE 529 MG/ML IV SOLN
20.0000 mL | Freq: Once | INTRAVENOUS | Status: AC | PRN
  Administered 2014-02-02: 20 mL via INTRAVENOUS

## 2014-02-02 MED ORDER — HYDRALAZINE HCL 20 MG/ML IJ SOLN
10.0000 mg | Freq: Four times a day (QID) | INTRAMUSCULAR | Status: DC | PRN
  Administered 2014-02-03 – 2014-02-04 (×2): 10 mg via INTRAVENOUS
  Filled 2014-02-02 (×2): qty 1

## 2014-02-02 MED ORDER — ADULT MULTIVITAMIN W/MINERALS CH
1.0000 | ORAL_TABLET | Freq: Every day | ORAL | Status: DC
  Administered 2014-02-03 – 2014-02-05 (×3): 1 via ORAL
  Filled 2014-02-02 (×3): qty 1

## 2014-02-02 NOTE — Progress Notes (Signed)
Chaplain visited patient on routine rounds. Patient reported that it had been difficult for her to get her rest. She reported having back surgery and shortly after experiencing an infection. She said that because she'd had two surgeries consecutively, she was having a hard time accepting the length of her recovery. Although she seemed a bit down, she did seemed to find comfort in reflecting on her family and those she worked with at her job. She reports being the oldest out of three other siblings and says that she has always been the one to take care of them. She says that it is a bit difficult to be the one who needs care. Patient says that she although she may be released soon, she does not think it would be the best place for her recover. Patient has considered continuing her rehab and physical therapy in a nursing home. Possible follow-up needed. Patient's overall demeanor seems to be positive, now with a little more concern for her health.

## 2014-02-02 NOTE — Progress Notes (Signed)
Patient lethargic this afternoon, difficult to arouse. Only given oxycodone 5mg  once today. Dr Baxter Flattery present. Attempted to get patients oral temp during that time and she resisted and clinched her mouth shut and shook her head. Axillary temp 98 and other VS within normal limits. When trying to assess pupils she resisted and forced her eyes shut and would not respond to any questions. Has stated numerous times today that she does not want to be bothered and has even refused vitals at times. However, when family present she is interactive with them. Therapy tried to get patient OOB and she resisted by throwing her legs back into the bed and stating she did not want to get up.  Patient has refused all attempts at mobilization today despite encouragement from staff members. Will continue to monitor.

## 2014-02-02 NOTE — Progress Notes (Signed)
UR complete.  Courtney Robarge RN, MSN 

## 2014-02-02 NOTE — Progress Notes (Signed)
Writer called by Financial controller while receiving report that patient wants something for pain. Writer went in room with pain medication and patient started going off on writer stating, "I did not ask for no dam medication, you all are just sorry and will not do what you are asked. I asked for cranberry juice and cream for my "ass". No one will listen". She then stated it was for her incision site.Writer explained to patient that the only message received was for pain but she could not listen. Writer went to check orders to make sure there were no orders for incision that was oversight and non found. Writer went back to room with outgoing nurse to clarify and explained to patient that there was no order for incision but will apply barrier cream to buttocks and change dressing to buttocks and if incision needs changed, will do as well. Patient and boyfriend in room stated that the previous nurse told them about the cream to her incision and writer told them outgoing nurse was the previous nurse. Outgoing nurse told them that she never had that conversation with them but will change the incision dressing gladly. Patient then stated, " unless you two bring the cream for my incision, do not touch me and in fact,  I don't want you as my nurse and you two get out". Writer and other nurse left room and called in charge nurse.

## 2014-02-02 NOTE — Progress Notes (Addendum)
Occupational Therapy Treatment Patient Details Name: Marissa [name] MRN: [id number] DOB: 05-09-1976 Today's Date: 02/02/2014    History of present illness pt presents after Lumbar I+D with multiple recent back surgeries.     OT comments  Pt with apparent cognitive deficits during session. Nursing made aware-pt not making sense at times and saying she did not feel right, but could not tell me what was wrong.   Follow Up Recommendations  Home health OT;Supervision/Assistance - 24 hour (depending on progress)    Equipment Recommendations  Other (comment) (tbd)    Recommendations for Other Services      Precautions / Restrictions Precautions Precautions: Fall;Back Precaution Comments: Reviewed precautions Required Braces or Orthoses: Spinal Brace Spinal Brace: Lumbar corset;Applied in sitting position Restrictions Weight Bearing Restrictions: No       Mobility Bed Mobility Overal bed mobility: Needs Assistance Bed Mobility: Rolling;Sidelying to Sit;Sit to Sidelying Rolling: Supervision Sidelying to sit: Supervision     Sit to sidelying: Supervision    Transfers Overall transfer level: Needs assistance Equipment used: Rolling walker (2 wheeled) Transfers: Sit to/from Stand Sit to Stand: Min guard         General transfer comment: cues for technique.    Balance                                   ADL Overall ADL's : Needs assistance/impaired     Grooming: Oral care;Brushing hair;Set up;Supervision/safety;Standing (tactile cue to stand upright)           Upper Body Dressing : Minimal assistance;Sitting (back brace)       Toilet Transfer: Min guard;Ambulation;RW;BSC (chair)   Toileting- Clothing Manipulation and Hygiene: Total assistance (hygiene in back-OT performed; standing)       Functional mobility during ADLs: Min guard;Rolling walker General ADL Comments: Pt laying in stool when OT arrived. Ambulated to bathroom and OT assisted  with peri care. Cues to use cup for teeth care to avoid bending-cues to stand upright while at sink.   Briefly showed pt reacher for LB ADLs.      Vision                     Perception     Praxis      Cognition   Behavior During Therapy: [facility] for tasks assessed/performed Overall Cognitive Status: Impaired/Different from baseline Area of Impairment: Safety/judgement;Following commands;Memory     Memory: Decreased short-term memory  Following Commands: Follows one step commands inconsistently Safety/Judgement: Decreased awareness of safety          Extremity/Trunk Assessment               Exercises     Shoulder Instructions       General Comments      Pertinent Vitals/ Pain       Pain 7/10. Repositioned. BP 163/97 when sitting in chair. Nurse notified.  Home Living                                          Prior Functioning/Environment              Frequency Min 2X/week     Progress Toward Goals  OT Goals(current goals can now be found in the care plan section)  Progress towards OT goals: Progressing toward goals  Acute Rehab OT Goals Patient Stated Goal: not stated OT Goal Formulation: With patient Time For Goal Achievement: 02/09/14 Potential to Achieve Goals: Good ADL Goals Pt Will Perform Lower Body Bathing: with min assist;with caregiver independent in assisting;sit to/from stand;with adaptive equipment Pt Will Perform Lower Body Dressing: with min assist;with caregiver independent in assisting;sit to/from stand;with adaptive equipment Pt Will Transfer to Toilet: with supervision;ambulating;bedside commode Pt Will Perform Toileting - Clothing Manipulation and hygiene: with set-up;with adaptive equipment;sit to/from stand;sitting/lateral leans Pt Will Perform Tub/Shower Transfer: with min assist;ambulating;3 in 1 Additional ADL Goal #1: pt will follow back precautions with sit to stand and during adls without cues   Plan Discharge plan remains appropriate    Co-evaluation                 End of Session Equipment Utilized During Treatment: Gait belt;Rolling walker;Back brace   Activity Tolerance Patient tolerated treatment well   Patient Left in bed;with call bell/phone within reach;with bed alarm set   Nurse Communication Mobility status (BP)        Time: 1517-6160 OT Time Calculation (min): 29 min  Charges: OT General Charges $OT Visit: 1 Procedure OT Treatments $Self Care/Home Management : 23-37 mins   Benito Mccreedy OTR/L [phone] 02/02/2014, 1:08 PM

## 2014-02-02 NOTE — Progress Notes (Signed)
Chart reviewed.   [facility] PROGRESS NOTE  Marissa [name] NAT:[id number] DOB: 08/23/1975 DOA: 01/24/2014 PCP: PROVIDER NOT IN SYSTEM  Assessment/Plan:    DM (diabetes mellitus), type 2 with renal complications:  CBGs controlled. Continue current regemine    Wound infection: group a strep and enterobacter. per ID    Severe sepsis with group A strep bacteremia: per ID   Acute renal failure: per renal    History of lumbar laminectomy     Obesity    Anemia    HTN (hypertension): refusing coreg PRN IV hydralazine   HPI/Subjective: "my BP is always high" "I don't like taking medications"  Objective: Filed Vitals:   02/02/14 0801  BP: 161/82  Pulse: 92  Temp:   Resp:     Intake/Output Summary (Last 24 hours) at 02/02/14 0818 Last data filed at 02/01/14 1924  Gross per 24 hour  Intake    350 ml  Output   3400 ml  Net  -3050 ml   Filed Weights   01/25/14 0500 01/31/14 1754  Weight: 117.9 kg (259 lb 14.8 oz) 123.424 kg (272 lb 1.6 oz)    Exam:   General:  Uncomfortable. oriented  Cardiovascular: RRR without MGR  Respiratory: CTA without WRR  Abdomen: S, NT, ND  Ext: No CCE  Basic Metabolic Panel:  Recent Labs Lab 01/29/14 0235 01/30/14 0400 01/31/14 0500 02/01/14 0429 02/02/14 0500  NA 143 140 141 145 143  K 3.7 3.7 3.4* 3.6* 3.6*  CL 104 100 99 104 102  CO2 21 23 22 22 23   GLUCOSE 201* 147* 182* 97 116*  BUN 32* 34* 35* 36* 38*  CREATININE 6.09* 6.99* 7.40* 7.85* 7.71*  CALCIUM 8.3* 8.5 8.5 8.6 8.6  MG  --   --   --  2.3 2.5  PHOS  --  6.6* 6.8*  --   --    Liver Function Tests:  Recent Labs Lab 01/30/14 0400 01/31/14 0500 02/01/14 0429 02/02/14 0500  AST  --   --  31 38*  ALT  --   --  8 11  ALKPHOS  --   --  86 87  BILITOT  --   --  <0.2* <0.2*  PROT  --   --  6.5 6.7  ALBUMIN 1.6* 1.7* 1.7* 1.9*   No results found for this basename: LIPASE, AMYLASE,  in the last 168 hours No results found for this basename:  AMMONIA,  in the last 168 hours CBC:  Recent Labs Lab 01/26/14 1115 01/28/14 0229 01/29/14 0235 01/30/14 0400 02/01/14 0429 02/02/14 0500  WBC 15.5* 11.4* 10.3 8.7 7.5 9.3  NEUTROABS 13.2* 9.6*  --   --  5.3 7.4  HGB 8.4* 8.6* 8.4* 8.2* 8.1* 8.7*  HCT 25.2* 25.9* 25.3* 25.1* 25.1* 26.0*  MCV 85.4 83.3 83.0 82.8 82.6 82.0  PLT 170 225 223 274 353 405*   Cardiac Enzymes: No results found for this basename: CKTOTAL, CKMB, CKMBINDEX, TROPONINI,  in the last 168 hours BNP (last 3 results)  Recent Labs  10/31/13 1446  PROBNP 253.7*   CBG:  Recent Labs Lab 02/01/14 0759 02/01/14 1145 02/01/14 1645 02/01/14 1953 02/02/14 0706  GLUCAP 101* 99 113* 97 110*    Recent Results (from the past 240 hour(s))  CULTURE, BLOOD (ROUTINE X 2)     Status: None   Collection Time    01/24/14  9:11 PM      Result Value Ref Range Status   Specimen Description BLOOD  LEFT ARM   Final   Special Requests BOTTLES DRAWN AEROBIC AND ANAEROBIC 10CC EACH   Final   Culture  Setup Time     Final   Value: 01/25/2014 00:41     Performed at [facility]   Culture     Final   Value: GROUP A STREP (S.PYOGENES) ISOLATED     Note: CRITICAL RESULT CALLED TO, READ BACK BY AND VERIFIED WITH: JOHN THOMASSON 12/27/13 0850 BY SMITHERSJ     Note: Gram Stain Report Called to,Read Back By and Verified With: THOMAS LILLEY 01/25/14 @ 8:14PM BY RUSCOE A.     Performed at [facility]   Report Status PENDING   Incomplete  CULTURE, BLOOD (ROUTINE X 2)     Status: None   Collection Time    01/24/14 10:41 PM      Result Value Ref Range Status   Specimen Description BLOOD LEFT ARM   Final   Special Requests BOTTLES DRAWN AEROBIC AND ANAEROBIC [facility] EACH   Final   Culture  Setup Time     Final   Value: 01/25/2014 14:08     Performed at [facility]   Culture     Final   Value: NO GROWTH 5 DAYS     Performed at [facility]   Report Status 01/31/2014 FINAL   Final  WOUND CULTURE      Status: None   Collection Time    01/25/14  1:48 AM      Result Value Ref Range Status   Specimen Description WOUND BACK   Final   Special Requests NONE   Final   Gram Stain     Final   Value: ABUNDANT WBC PRESENT,BOTH PMN AND MONONUCLEAR     FEW GRAM POSITIVE COCCI IN PAIRS     RARE GRAM NEGATIVE RODS     Gram Stain Report Called to,Read Back By and Verified With: Gram Stain Report Called to,Read Back By and Verified With: Denyse Amass RN [id number] BY GREEN R Performed at [facility]     Performed at [facility]   Culture     Final   Value: MODERATE ENTEROBACTER AEROGENES     ABUNDANT GROUP A STREP (S.PYOGENES) ISOLATED     Performed at [facility]   Report Status 01/27/2014 FINAL   Final   Organism ID, Bacteria ENTEROBACTER AEROGENES   Final  GRAM STAIN     Status: None   Collection Time    01/25/14  1:48 AM      Result Value Ref Range Status   Specimen Description WOUND BACK   Final   Special Requests NONE   Final   Gram Stain     Final   Value: ABUNDANT WBC PRESENT,BOTH PMN AND MONONUCLEAR     FEW GRAM POSITIVE COCCI IN PAIRS     RARE GRAM NEGATIVE RODS     Gram Stain Report Called to,Read Back By and Verified With: Denyse Amass RN [phone] GREEN R   Report Status 01/25/2014 FINAL   Final  MRSA PCR SCREENING     Status: None   Collection Time    01/25/14  3:05 AM      Result Value Ref Range Status   MRSA by PCR NEGATIVE  NEGATIVE Final   Comment:            The GeneXpert MRSA Assay (FDA     approved for NASAL specimens  only), is one component of a     comprehensive MRSA colonization     surveillance program. It is not     intended to diagnose MRSA     infection nor to guide or     monitor treatment for     MRSA infections.  URINE CULTURE     Status: None   Collection Time    01/25/14  3:30 AM      Result Value Ref Range Status   Specimen Description URINE, CATHETERIZED   Final   Special Requests NONE   Final   Culture  Setup  Time     Final   Value: 01/25/2014 14:15     Performed at [facility] Count     Final   Value: >=100,000 COLONIES/ML     Performed at [facility]   Culture     Final   Value: ENTEROCOCCUS SPECIES     Performed at [facility]   Report Status 01/27/2014 FINAL   Final   Organism ID, Bacteria ENTEROCOCCUS SPECIES   Final  CULTURE, BLOOD (ROUTINE X 2)     Status: None   Collection Time    01/26/14  9:50 AM      Result Value Ref Range Status   Specimen Description BLOOD LEFT ANTECUBITAL   Final   Special Requests BOTTLES DRAWN AEROBIC ONLY 3CC   Final   Culture  Setup Time     Final   Value: 01/26/2014 13:59     Performed at [facility]   Culture     Final   Value: NO GROWTH 5 DAYS     Performed at [facility]   Report Status 02/01/2014 FINAL   Final  CULTURE, BLOOD (ROUTINE X 2)     Status: None   Collection Time    01/26/14 10:00 AM      Result Value Ref Range Status   Specimen Description BLOOD RIGHT HAND   Final   Special Requests BOTTLES DRAWN AEROBIC ONLY 2CC   Final   Culture  Setup Time     Final   Value: 01/26/2014 13:58     Performed at [facility]   Culture     Final   Value: NO GROWTH 5 DAYS     Performed at [facility]   Report Status 02/01/2014 FINAL   Final  CLOSTRIDIUM DIFFICILE BY PCR     Status: None   Collection Time    01/28/14  9:35 PM      Result Value Ref Range Status   C difficile by pcr NEGATIVE  NEGATIVE Final     Studies: Dg Abd Acute W/chest  02/01/2014   CLINICAL DATA:  Abd pain  EXAM: ACUTE ABDOMEN SERIES (ABDOMEN 2 VIEW & CHEST 1 VIEW)  COMPARISON:  Portal chest radiograph 01/30/2014  FINDINGS: There is no evidence of dilated bowel loops or free intraperitoneal air. No radiopaque calculi or other significant radiographic abnormality is seen. The heart is enlarged. Left central venous catheter via subclavian approach tip superior vena cava. Persistent density left lung base.  No acute osseous abnormalities. Decreased density right lung base.  IMPRESSION: Persistent airspace disease in left lung base. Improved disease right lung base. Nonobstructive bowel gas pattern. No evidence of free air.   Electronically Signed   By: Margaree Mackintosh M.D.   On: 02/01/2014 14:42    Scheduled Meds: . atorvastatin  80 mg Per Tube QHS  .  carvedilol  12.5 mg Oral BID WC  . cefTRIAXone (ROCEPHIN)  IV  2 g Intravenous Q24H  . furosemide  60 mg Intravenous Daily  . gabapentin  300 mg Oral Daily  . insulin aspart  0-20 Units Subcutaneous TID WC  . pantoprazole  40 mg Oral Daily  . senna  1 tablet Oral BID  . sodium chloride  10-40 mL Intracatheter Q12H  . topiramate  125 mg Per Tube QHS   Continuous Infusions: . sodium chloride 10 mL/hr at 01/28/14 2300    Time spent: 25 minutes  Eulogio Bear  [facility] Pager [phone]. If 7PM-7AM, please contact night-coverage at [email or web address], password [facility] 02/02/2014, 8:18 AM  LOS: 9 days

## 2014-02-02 NOTE — Progress Notes (Signed)
INITIAL NUTRITION ASSESSMENT  DOCUMENTATION CODES Per approved criteria  -Obesity Unspecified   INTERVENTION: Encouraged PO intake; pt to ask family to bring food from home Provide Multivitamin with minerals daily  NUTRITION DIAGNOSIS: Inadequate oral intake related to limited food preferences as evidenced by PO intake 0 to 50% of meals since surgery.   Goal: Pt to meet >/= 90% of their estimated nutrition needs   Monitor:  PO intake, weight, labs  Reason for Assessment: Malnutrition Screening Tool, score of 5  38 y.o. female  Admitting Dx: <principal problem not specified>  ASSESSMENT: [name] with DM, HTN, normal renal function at baseline (0.95). S/p L5S1 decompression procedure 6/5. Readmitted 6/13 with pain, fever, draining wound - had irrigation and debridement of wound with "vancomycin powder" placed in the deep wound space and was started on vanco and zosyn.   Pt states she has not been eating well since surgery due to limited food preferences and disliking hospital food. Per nursing notes meal completion has been 0-50%. Pt denies weight loss and reports having a good appetite. She usually follows a vegetarian diet. Discussed the importance of nutrition for wound healing. Discussed vegetarian protein-rich foods and encouraged daily intake. Discussed snack and supplement options but, pt declined. Pt states she will have family/friends bring her food from outside the hospital.   Height: Ht Readings from Last 1 Encounters:  01/31/14 5\' 5"  (1.651 m)    Weight: Wt Readings from Last 1 Encounters:  01/31/14 272 lb 1.6 oz (123.424 kg)    Ideal Body Weight: 125 lbs  % Ideal Body Weight: 218%  Wt Readings from Last 10 Encounters:  01/31/14 272 lb 1.6 oz (123.424 kg)  01/31/14 272 lb 1.6 oz (123.424 kg)  01/18/14 242 lb 8.1 oz (110 kg)  01/18/14 242 lb 8.1 oz (110 kg)  01/13/14 239 lb 3.2 oz (108.5 kg)  10/20/13 241 lb 4 oz (109.43 kg)  05/06/13 230 lb 9.6 oz  (104.599 kg)    Usual Body Weight: unknown  % Usual Body Weight: NA  BMI:  Body mass index is 45.28 kg/(m^2).  Estimated Nutritional Needs: Kcal: 2000-2000 Protein: 90-105 grams Fluid: 2.7 L/day  Skin: +1 RLE and LLE edema; open wound on medial coccyx  Diet Order: Vegetarian , Carb Modified  EDUCATION NEEDS: -No education needs identified at this time   Intake/Output Summary (Last 24 hours) at 02/02/14 1608 Last data filed at 02/01/14 1924  Gross per 24 hour  Intake    200 ml  Output    800 ml  Net   -600 ml    Last BM: 6/21  Labs:   Recent Labs Lab 01/30/14 0400 01/31/14 0500 02/01/14 0429 02/02/14 0500  NA 140 141 145 143  K 3.7 3.4* 3.6* 3.6*  CL 100 99 104 102  CO2 23 22 22 23   BUN 34* 35* 36* 38*  CREATININE 6.99* 7.40* 7.85* 7.71*  CALCIUM 8.5 8.5 8.6 8.6  MG  --   --  2.3 2.5  PHOS 6.6* 6.8*  --   --   GLUCOSE 147* 182* 97 116*    CBG (last 3)   Recent Labs  02/01/14 1953 02/02/14 0706 02/02/14 1158  GLUCAP 97 110* 142*    Scheduled Meds: . atorvastatin  80 mg Per Tube QHS  . carvedilol  12.5 mg Oral BID WC  . cefTRIAXone (ROCEPHIN)  IV  2 g Intravenous Q24H  . furosemide  60 mg Intravenous Daily  . gabapentin  300  mg Oral Daily  . insulin aspart  0-20 Units Subcutaneous TID WC  . pantoprazole  40 mg Oral Daily  . senna  1 tablet Oral BID  . sodium chloride  10-40 mL Intracatheter Q12H  . topiramate  125 mg Per Tube QHS    Continuous Infusions: . sodium chloride 10 mL/hr at 01/28/14 2300    Past Medical History  Diagnosis Date  . Hypertension   . Coronary artery disease   . Neuropathy   . Headache(784.0)   . Arthritis   . Vision loss     due to diabetes  . Myocardial infarction 2006    "due to medication"; no evidence of ischemia or infarction by nuclear stress test '11  . Dysrhythmia     tachycardia  . Diabetes mellitus without complication     fasting cbg 50-140s  . GERD (gastroesophageal reflux disease)     otc  meds  . Migraines     once/month maybe - can last up to two weeks  . Hyperlipemia     Past Surgical History  Procedure Laterality Date  . Back surgery  2011    Lumbar   . Cervical fusion  2006  . Salpingoophorectomy Bilateral     1 1997. 2nd 2001  . Cardiac catheterization  07/18/2004    50-60% mid LAD, minor luminal irregularities RCA, normal LM and CX, EF 50-55% [facility])  . Lumbar laminectomy/decompression microdiscectomy Right 05/07/2013    Procedure: Right Lumbar one-two laminectomy;  Surgeon: Elaina Hoops, MD;  Location: [facility];  Service: Neurosurgery;  Laterality: Right;  . Carpel tunnel Bilateral   . Lumbar laminectomy/decompression microdiscectomy Left 10/29/2013    Procedure: Left Lumbar five-Sacral one Laminectomy;  Surgeon: Elaina Hoops, MD;  Location: [facility];  Service: Neurosurgery;  Laterality: Left;  . Lumbar wound debridement N/A 01/25/2014    Procedure: LUMBAR WOUND DEBRIDEMENT;  Surgeon: Charlie Pitter, MD;  Location: [facility];  Service: Neurosurgery;  Laterality: N/A;    Pryor Ochoa RD, LDN Inpatient Clinical Dietitian Pager: [phone] After Hours Pager: [phone]

## 2014-02-02 NOTE — Progress Notes (Signed)
[facility] for Infectious Disease    Date of Admission:  01/24/2014   Total days of antibiotics 10        Day 5 ceftriaxone           ID: Marissa [name] is a 38 y.o. female with HTN ,DM, obesity s/p redo decompressive lumbar laminectomy (L5-S1) and posterior lumbar interbody fusion on 01/16/14 presents with sepsis from group a strep bacteremia and post-op wound with group a & enterobacter s/p irrigation and debridement of superficial and deep wound space infection. Having oligouric AKI/ATN  Active Problems:   Wound infection   Acute respiratory failure with hypoxia   Severe sepsis(995.92)   History of lumbar laminectomy   Acute renal failure   Obesity   Anemia   HTN (hypertension)   DM (diabetes mellitus), type 2 with renal complications   Nausea alone   Abdominal pain, unspecified site    Subjective: Afebrile, refusing to work with PT, wanted to rest  Medications:  . atorvastatin  80 mg Per Tube QHS  . carvedilol  12.5 mg Oral BID WC  . cefTRIAXone (ROCEPHIN)  IV  2 g Intravenous Q24H  . furosemide  60 mg Intravenous Daily  . gabapentin  300 mg Oral Daily  . insulin aspart  0-20 Units Subcutaneous TID WC  . pantoprazole  40 mg Oral Daily  . senna  1 tablet Oral BID  . sodium chloride  10-40 mL Intracatheter Q12H  . topiramate  125 mg Per Tube QHS    Objective: Vital signs in last 24 hours: Temp:  [98.3 F (36.8 C)-98.9 F (37.2 C)] 98.7 F (37.1 C) (06/22 0452) Pulse Rate:  [88-96] 92 (06/22 0801) Resp:  [16-18] 16 (06/22 0452) BP: (140-162)/(72-101) 161/82 mmHg (06/22 0801) SpO2:  [93 %-98 %] 98 % (06/22 0452) Physical Exam  Constitutional:  Sedated. Appears sedated? appears well-developed and well-nourished. No distress.  HENT: refused to open mouth Mouth/Throat: Oropharynx is clear and moist. No oropharyngeal exudate.  Cardiovascular: Normal rate, regular rhythm and normal heart sounds. Exam reveals no gallop and no friction rub.  No murmur heard.    Pulmonary/Chest: Effort normal and breath sounds normal. No respiratory distress.  has no wheezes.  Skin: Skin is warm and dry. No rash noted. No erythema. Neuro = moves all extremities, able to keep eyes closed, moved hands but did not want to participate in neuro exam     Lab Results  Recent Labs  02/01/14 0429 02/02/14 0500  WBC 7.5 9.3  HGB 8.1* 8.7*  HCT 25.1* 26.0*  NA 145 143  K 3.6* 3.6*  CL 104 102  CO2 22 23  BUN 36* 38*  CREATININE 7.85* 7.71*   Liver Panel  Recent Labs  02/01/14 0429 02/02/14 0500  PROT 6.5 6.7  ALBUMIN 1.7* 1.9*  AST 31 38*  ALT 8 11  ALKPHOS 86 87  BILITOT <0.2* <0.2*    Microbiology: 6/17 cdiff negative 6/15blood cx ngtd 6/14 urine cx amp S enterococcus 6/14 blood cx group a strep 6/14 wound cx enterobacter & group a Studies/Results: Dg Abd Acute W/chest  02/01/2014   CLINICAL DATA:  Abd pain  EXAM: ACUTE ABDOMEN SERIES (ABDOMEN 2 VIEW & CHEST 1 VIEW)  COMPARISON:  Portal chest radiograph 01/30/2014  FINDINGS: There is no evidence of dilated bowel loops or free intraperitoneal air. No radiopaque calculi or other significant radiographic abnormality is seen. The heart is enlarged. Left central venous catheter via subclavian approach tip superior vena  cava. Persistent density left lung base. No acute osseous abnormalities. Decreased density right lung base.  IMPRESSION: Persistent airspace disease in left lung base. Improved disease right lung base. Nonobstructive bowel gas pattern. No evidence of free air.   Electronically Signed   By: Margaree Mackintosh M.D.   On: 02/01/2014 14:42     Assessment/Plan: Group a strep sepsis = appears improving and no further evidence of ongoing infection. Blood cx from 6/15 still ngtd. Treated with ceftriaxone to also cover enterobacter  Group a strep and enterobacter = isolate is sensitive to ceftriaxone. Continue with ceftriaxone 2gm IV daily. Will need 6 wks of IV therapy. Using 6/15 as day 1 of 42  days. End date of July 26.  Aki/ ATN from group A strep sepsis/vanco toxicity = cr appears not worsening, still producing urine. Being managed by primary team and nephrology with hopes it improves  Sedation = would avoid excess sedative meds. She gets gabapentin but wonder if 300mg  dose is too much for her.   Will arrange for follow up in the ID clinic. Will sign off  Baxter Flattery [facility] for Infectious Diseases Cell: [phone] Pager: [phone]  02/02/2014, 11:40 AM

## 2014-02-02 NOTE — Progress Notes (Signed)
Physical Therapy Treatment Patient Details Name: Marissa [name] MRN: [id number] DOB: August 04, 1976 Today's Date: 02/02/2014    History of Present Illness pt presents after Lumbar I+D with multiple recent back surgeries.      PT Comments    While pt did not verbally refuse or decline mobility/PT this session (in fact, when I entered the room she agreed to work) , she clearly was not participating once we initiated bed mobility; She stated, "I can't walk", and when I told her that is why PT is involved in her care, to work on bed mobility and ambulation, she closed her eyes and was minimally responsive; RN informed; Noted pt was also with dubious cognition/participation in OT session earlier today  She was able to state her back precautions   Follow Up Recommendations  Home health PT;Supervision for mobility/OOB If pt doesn't participate and make adequate progress, we may need to consider a SNF stay     Equipment Recommendations  None recommended by PT    Recommendations for Other Services       Precautions / Restrictions Precautions Precautions: Fall;Back Precaution Comments: Reviewed precautions Required Braces or Orthoses: Spinal Brace Spinal Brace: Lumbar corset;Applied in sitting position Restrictions Weight Bearing Restrictions: No    Mobility  Bed Mobility Overal bed mobility: Needs Assistance Bed Mobility: Rolling Rolling: Mod assist Sidelying to sit: Supervision     Sit to sidelying: Supervision General bed mobility comments: Mod assist to log roll into full L sidelie; Verbal and tactile cueing to reach for bedrail with R UE, and log roll; Pt abruptly stopped rolling, and laid back into her initial position of propped in L semi-sidelie; her back was straight during this activity  Transfers Overall transfer level: Needs assistance Equipment used: Rolling walker (2 wheeled) Transfers: Sit to/from Stand Sit to Stand: Min guard         General transfer comment:  cues for technique.  Ambulation/Gait                 Stairs            Wheelchair Mobility    Modified Rankin (Stroke Patients Only)       Balance                                    Cognition Arousal/Alertness: Lethargic Behavior During Therapy: Agitated Overall Cognitive Status: Impaired/Different from baseline Area of Impairment: Following commands;Safety/judgement     Memory: Decreased short-term memory Following Commands: Follows one step commands inconsistently Safety/Judgement: Decreased awareness of safety     General Comments: Pt did not respond when this PT informed her that I am here to help her with getting up and walking, after she stated "I can't walk"; Marissa [name] was able to recall and state her precautions as we were initiating rolling for coming to sit; mid-roll pt stopped and rolled back to semi-sidelie position, closed her eyes, and then became minimally responsive to questions; with time, she was able to state that she wanted pain meds; Did not answer when asked, "Will you get up and OOB? yes, or no"    Exercises      General Comments        Pertinent Vitals/Pain Did not rate pain patient repositioned for comfort     Home Living  Prior Function            PT Goals (current goals can now be found in the care plan section) Acute Rehab PT Goals Patient Stated Goal: not stated Time For Goal Achievement: 02/09/14 Progress towards PT goals: Not progressing toward goals - comment (not participating this session)    Frequency  Min 4X/week    PT Plan Current plan remains appropriate;Other (comment) (If pt doesn't participate and make adequate progress, we may need to consider a SNF stay)    Co-evaluation             End of Session   Activity Tolerance: Other (comment) (did not participate with PT this session) Patient left: in bed;with call bell/phone within reach Propped in L  semi-sidelie position, propped on pillows, pillow between knees     Time: 9417-4081 PT Time Calculation (min): 16 min  Charges:  $Therapeutic Activity: 8-22 mins                    G Codes:      Quin Hoop 02/02/2014, 3:05 PM  Roney Marion, [place] Pager [phone] Office [phone]

## 2014-02-02 NOTE — Progress Notes (Signed)
Patient evaluated for community based chronic disease management services with [facility] as a benefit of patient's [employer].  Patient was is consult with the chaplain. Left contact information and [facility] literature at bedside. Let patient know that writer would reach out to her again prior to discharge.  Made Inpatient Case Manager aware that [place] [facility] following. Of note, [facility] Care Management services does not replace or interfere with any services that are arranged by inpatient case management or social work.  For additional questions or referrals please contact Corliss Blacker BSN RN [facility] Liaison at [phone].

## 2014-02-02 NOTE — Progress Notes (Signed)
[facility]  Patient Status: Active pt with [facility] this admission  [facility] is providing the following services: HHRN   If patient discharges after hours, please call [phone].   Larry Sierras 02/02/2014, 3:13 PM

## 2014-02-02 NOTE — Progress Notes (Signed)
Patient ID: Marissa [name], female   DOB: 07-28-1976, 38 y.o.   MRN: [id number] Patient doing well with improved pain still difficulty with diarrhea no new numbness or tingling  Neurologically improving increase strength left lower extremity  Significant improvement preoperative pain control and infection daily function seems to be may be slowly improving we'll check followup lumbar spine MRI with and without contrast continued mobilization physical and occupational therapy

## 2014-02-02 NOTE — Progress Notes (Signed)
BACKGROUND [name] with  DM, HTN, normal renal function at baseline (0.95). S/p L5S1 decompression procedure 6/5. Readmitted  6/13 with pain, fever, draining wound - had irrigation and debridement of wound with "vancomycin powder" placed in the deep wound space and was started on vanco and zosyn. Hypotension 6/14 into the 80's but no pressors ever required. Blood cultures  + for group A strep. Wound + Group A strep and enterobacter, urine + enterococcus.  Over 24 hours 6/14->6/15 rapid rise in creatinine from 0.95 to 2.8 and subsequently to 6 on 6/18 and it continues to rise. Remains non-oliguric,   Blood pressures soft through the 16th but normal at this time.  Ultrasound raised question of hydro -> CT discounted that.    ASSESSMENT/RECOMMENDATIONS 1.Renal- Acute kidney injury likely a result of ATN from group a strep bacteremia and hypotension. Had a period of oliguria - now non-oliguric.  Creatinine stable for the first time today!!  7.4--7.8---7.7  The degree of injury seems disproportionate to the insult (BP) and raises question as to something else going on although initial urine not suspicious for GN, and repeat UA 6/20 equally as underwhelming.  Given fact that she is young and had normal renal function at baseline her prognosis for ultimate recovery should be good. I hope are seeing the plateau before improvement today. No indications for dialysis. 2. Hypertension/volume - Hypotension has resolved and she is now normotensive/hypertensive. On moderate dose carvedilol but refusing it. Okay to continue this at this time with parameters. Lasix daily was added to her regimen 6/18.  She is making good urine 3. Group A strep bacteremia/wound with GpA strep and enterobacter, urine with enterococcus - ID is following and she is currently on Rocephin.  4. Anemia -Likely situational in nature. Supportive care for now   Subjective:  Not hungry (says was nauseous even PTA - tough to sort out if any  effects uremia  Objective Vital signs in last 24 hours: Filed Vitals:   02/01/14 2107 02/02/14 0217 02/02/14 0452 02/02/14 0801  BP: 147/87 149/90 162/101 161/82  Pulse: 93 88 91 92  Temp: 98.9 F (37.2 C) 98.5 F (36.9 C) 98.7 F (37.1 C)   TempSrc: Oral Oral Oral   Resp: 18 18 16    Height:      Weight:      SpO2: 93% 94% 98%    Weight change:   Intake/Output Summary (Last 24 hours) at 02/02/14 1140 Last data filed at 02/01/14 1924  Gross per 24 hour  Intake    350 ml  Output   3400 ml  Net  -3050 ml   Physical Exam:  BP 161/82  Pulse 92  Temp(Src) 98.7 F (37.1 C) (Oral)  Resp 16  Ht 5\' 5"  (1.651 m)  Wt 123.424 kg (272 lb 1.6 oz)  BMI 45.28 kg/m2  SpO2 98%  [name], NAD but quite sleepy VS as noted above Lungs clear No rub Abd soft 1+ edema LE's Clear dilute appearing urine in foley    Recent Labs Lab 01/27/14 2005 01/28/14 1600 01/29/14 0235 01/30/14 0400 01/31/14 0500 02/01/14 0429 02/02/14 0500  NA 137 142 143 140 141 145 143  K 4.1 3.7 3.7 3.7 3.4* 3.6* 3.6*  CL 102 104 104 100 99 104 102  CO2 17* 20 21 23 22 22 23   GLUCOSE 254* 175* 201* 147* 182* 97 116*  BUN 27* 31* 32* 34* 35* 36* 38*  CREATININE 4.55* 5.72* 6.09* 6.99* 7.40*  7.85* 7.71*  CALCIUM 7.6* 8.2* 8.3* 8.5 8.5 8.6 8.6  PHOS  --   --   --  6.6* 6.8*  --   --     Recent Labs Lab 01/31/14 0500 02/01/14 0429 02/02/14 0500  AST  --  31 38*  ALT  --  8 11  ALKPHOS  --  86 87  BILITOT  --  <0.2* <0.2*  PROT  --  6.5 6.7  ALBUMIN 1.7* 1.7* 1.9*    Recent Labs Lab 01/28/14 0229 01/29/14 0235 01/30/14 0400 02/01/14 0429 02/02/14 0500  WBC 11.4* 10.3 8.7 7.5 9.3  NEUTROABS 9.6*  --   --  5.3 7.4  HGB 8.6* 8.4* 8.2* 8.1* 8.7*  HCT 25.9* 25.3* 25.1* 25.1* 26.0*  MCV 83.3 83.0 82.8 82.6 82.0  PLT 225 223 274 353 405*    Recent Labs Lab 02/01/14 0759 02/01/14 1145 02/01/14 1645 02/01/14 1953 02/02/14 0706  GLUCAP 101* 99 113* 97 110*    Studies/Results: Dg Abd Acute W/chest  02/01/2014   CLINICAL DATA:  Abd pain  EXAM: ACUTE ABDOMEN SERIES (ABDOMEN 2 VIEW & CHEST 1 VIEW)  COMPARISON:  Portal chest radiograph 01/30/2014  FINDINGS: There is no evidence of dilated bowel loops or free intraperitoneal air. No radiopaque calculi or other significant radiographic abnormality is seen. The heart is enlarged. Left central venous catheter via subclavian approach tip superior vena cava. Persistent density left lung base. No acute osseous abnormalities. Decreased density right lung base.  IMPRESSION: Persistent airspace disease in left lung base. Improved disease right lung base. Nonobstructive bowel gas pattern. No evidence of free air.   Electronically Signed   By: Margaree Mackintosh M.D.   On: 02/01/2014 14:42   Medications: . sodium chloride 10 mL/hr at 01/28/14 2300   . atorvastatin  80 mg Per Tube QHS  . carvedilol  12.5 mg Oral BID WC  . cefTRIAXone (ROCEPHIN)  IV  2 g Intravenous Q24H  . furosemide  60 mg Intravenous Daily  . gabapentin  300 mg Oral Daily  . insulin aspart  0-20 Units Subcutaneous TID WC  . pantoprazole  40 mg Oral Daily  . senna  1 tablet Oral BID  . sodium chloride  10-40 mL Intracatheter Q12H  . topiramate  125 mg Per Tube QHS   Berneita Sanagustin A   02/02/2014, 11:40 AM

## 2014-02-03 DIAGNOSIS — I10 Essential (primary) hypertension: Secondary | ICD-10-CM | POA: Diagnosis not present

## 2014-02-03 DIAGNOSIS — T8140XA Infection following a procedure, unspecified, initial encounter: Secondary | ICD-10-CM | POA: Diagnosis not present

## 2014-02-03 DIAGNOSIS — E1129 Type 2 diabetes mellitus with other diabetic kidney complication: Secondary | ICD-10-CM | POA: Diagnosis not present

## 2014-02-03 LAB — COMPREHENSIVE METABOLIC PANEL
ALT: 12 U/L (ref 0–35)
AST: 32 U/L (ref 0–37)
Albumin: 1.7 g/dL — ABNORMAL LOW (ref 3.5–5.2)
Alkaline Phosphatase: 82 U/L (ref 39–117)
BUN: 37 mg/dL — ABNORMAL HIGH (ref 6–23)
CO2: 24 meq/L (ref 19–32)
Calcium: 8.1 mg/dL — ABNORMAL LOW (ref 8.4–10.5)
Chloride: 105 mEq/L (ref 96–112)
Creatinine, Ser: 6.96 mg/dL — ABNORMAL HIGH (ref 0.50–1.10)
GFR, EST AFRICAN AMERICAN: 8 mL/min — AB (ref >90)
GFR, EST NON AFRICAN AMERICAN: 7 mL/min — AB (ref >90)
GLUCOSE: 166 mg/dL — AB (ref 70–99)
Potassium: 3.3 mEq/L — ABNORMAL LOW (ref 3.7–5.3)
Sodium: 145 mEq/L (ref 137–147)
Total Bilirubin: 0.2 mg/dL — ABNORMAL LOW (ref 0.3–1.2)
Total Protein: 6.5 g/dL (ref 6.0–8.3)

## 2014-02-03 LAB — GLUCOSE, CAPILLARY
Glucose-Capillary: 161 mg/dL — ABNORMAL HIGH (ref 70–99)
Glucose-Capillary: 120 mg/dL — ABNORMAL HIGH (ref 70–99)
Glucose-Capillary: 142 mg/dL — ABNORMAL HIGH (ref 70–99)
Glucose-Capillary: 125 mg/dL — ABNORMAL HIGH (ref 70–99)

## 2014-02-03 LAB — CBC WITH DIFFERENTIAL/PLATELET
Basophils Absolute: 0 10*3/uL (ref 0.0–0.1)
Basophils Relative: 0 % (ref 0–1)
Eosinophils Absolute: 0.2 10*3/uL (ref 0.0–0.7)
Eosinophils Relative: 3 % (ref 0–5)
HEMATOCRIT: 25.1 % — AB (ref 36.0–46.0)
Hemoglobin: 8 g/dL — ABNORMAL LOW (ref 12.0–15.0)
LYMPHS PCT: 13 % (ref 12–46)
Lymphs Abs: 1.1 10*3/uL (ref 0.7–4.0)
MCH: 26.6 pg (ref 26.0–34.0)
MCHC: 31.9 g/dL (ref 30.0–36.0)
MCV: 83.4 fL (ref 78.0–100.0)
MONO ABS: 0.8 10*3/uL (ref 0.1–1.0)
Monocytes Relative: 9 % (ref 3–12)
Neutro Abs: 6.3 10*3/uL (ref 1.7–7.7)
Neutrophils Relative %: 75 % (ref 43–77)
Platelets: 399 10*3/uL (ref 150–400)
RBC: 3.01 MIL/uL — ABNORMAL LOW (ref 3.87–5.11)
RDW: 14.4 % (ref 11.5–15.5)
WBC: 8.4 10*3/uL (ref 4.0–10.5)

## 2014-02-03 LAB — MAGNESIUM: MAGNESIUM: 2.4 mg/dL (ref 1.5–2.5)

## 2014-02-03 MED ORDER — CARVEDILOL 12.5 MG PO TABS
25.0000 mg | ORAL_TABLET | Freq: Two times a day (BID) | ORAL | Status: DC
  Administered 2014-02-03 – 2014-02-05 (×4): 25 mg via ORAL
  Filled 2014-02-03 (×2): qty 2

## 2014-02-03 MED ORDER — FUROSEMIDE 40 MG PO TABS
40.0000 mg | ORAL_TABLET | Freq: Two times a day (BID) | ORAL | Status: DC
  Administered 2014-02-03 – 2014-02-04 (×2): 40 mg via ORAL
  Filled 2014-02-03 (×2): qty 1

## 2014-02-03 MED ORDER — POTASSIUM CHLORIDE CRYS ER 20 MEQ PO TBCR
40.0000 meq | EXTENDED_RELEASE_TABLET | Freq: Once | ORAL | Status: AC
Start: 2014-02-03 — End: 2014-02-03
  Administered 2014-02-03: 40 meq via ORAL
  Filled 2014-02-03: qty 2

## 2014-02-03 NOTE — Progress Notes (Addendum)
Patient ID: Marissa [name], female   DOB: 22-Oct-1975, 38 y.o.   MRN: [id number] Mrs.Ammon is doing okay stable with her pain no new numbness or tingling in her leg pain strength is slowly coming back.  Strength 5/ 5 on the right and 4+ out of 5 left lower extremity in stable  Progressive mobilization with physical and occupational therapy  MRI with normal postoperative fluid collection without apparent significant mass effect in light of improving clinical exam.

## 2014-02-03 NOTE — Progress Notes (Signed)
Physical Therapy Treatment Patient Details Name: Marissa [name] MRN: [id number] DOB: [birth year] Today's Date: 02/03/2014    History of Present Illness 38 y.o. pt admitted 01/24/14 presents with LBP with radiation to both LE after L5-S1 decompression and fusion. PMHx of diabetes.      PT Comments    Patient mobilizes well enough to go home with family support. PT continuing to follow due to patient's staggering gait pattern in hallway. Patient would benefit from HHPT.  Follow Up Recommendations  Home health PT     Equipment Recommendations  None recommended by PT       Precautions / Restrictions Precautions Precautions: Fall;Back Precaution Comments: Reinforced the use of back precautions and spinal brace Required Braces or Orthoses: Spinal Brace Spinal Brace: Lumbar corset;Applied in sitting position Restrictions Weight Bearing Restrictions: No    Mobility  Bed Mobility Overal bed mobility: Independent Bed Mobility: Rolling;Sidelying to Sit Rolling: Independent Sidelying to sit: Independent          Transfers Overall transfer level: Independent Equipment used: None Transfers: Sit to/from Stand              Ambulation/Gait Ambulation/Gait assistance: Supervision (For safety because of staggering) Ambulation Distance (Feet): 150 Feet Assistive device: None Gait Pattern/deviations: Staggering left;Staggering right Magazine features editor when multitasking)         Stairs Stairs: Yes Stairs assistance: Min assist Stair Management: Step to pattern (Light two person hand held) Number of Stairs: 3        Balance Overall balance assessment: Independent Sitting-balance support: Feet supported;Single extremity supported;Bilateral upper extremity supported Sitting balance-Leahy Scale: Good     Standing balance support: No upper extremity supported Standing balance-Leahy Scale: Good                      Cognition Arousal/Alertness: Awake/alert Behavior  During Therapy: WFL for tasks assessed/performed Overall Cognitive Status: Within Functional Limits for tasks assessed                             Pertinent Vitals/Pain See vitals flow sheet.           PT Goals (current goals can now be found in the care plan section) Acute Rehab PT Goals Patient Stated Goal: Patient wants to go home PT Goal Formulation: With patient Time For Goal Achievement: 02/17/14 Potential to Achieve Goals: Good Progress towards PT goals: Progressing toward goals    Frequency  Min 4X/week    PT Plan Current plan remains appropriate       End of Session Equipment Utilized During Treatment: Gait belt;Back brace Activity Tolerance: Patient tolerated treatment well Patient left: in chair;with call bell/phone within reach     Time: 8887-5797 PT Time Calculation (min): 24 min  Charges:                       G CodesEber Jones, SPT [phone]

## 2014-02-03 NOTE — Progress Notes (Signed)
Chart reviewed.   [facility] PROGRESS NOTE  Marissa [name] YIF:[id number] DOB: [birth year] DOA: 01/24/2014 PCP: PROVIDER NOT IN SYSTEM  Assessment/Plan:    DM (diabetes mellitus), type 2 with renal complications:  CBGs controlled. Continue current regemine    Wound infection: group a strep and enterobacter. per ID    Severe sepsis with group A strep bacteremia: per ID: Will need 6 wks of IV therapy. Using 6/15 as day 1 of 42 days. End date of July 26.   Acute renal failure: per renal -foley for strict i/os- d/c when ok with nephro    History of lumbar laminectomy     Obesity    Anemia    HTN (hypertension): increase coreg as patient now taking PRN IV hydralazine  Hypokalemia- replete   HPI/Subjective: "I want to go home"  Objective: Filed Vitals:   02/03/14 0617  BP: 162/91  Pulse: 91  Temp: 99.3 F (37.4 C)  Resp: 18    Intake/Output Summary (Last 24 hours) at 02/03/14 0823 Last data filed at 02/02/14 1800  Gross per 24 hour  Intake    220 ml  Output   1225 ml  Net  -1005 ml   Filed Weights   01/25/14 0500 01/31/14 1754  Weight: 117.9 kg (259 lb 14.8 oz) 123.424 kg (272 lb 1.6 oz)    Exam:   General:  Uncomfortable. oriented  Cardiovascular: RRR without MGR  Respiratory: CTA without WRR  Abdomen: S, NT, ND  Ext: No CCE  Basic Metabolic Panel:  Recent Labs Lab 01/30/14 0400 01/31/14 0500 02/01/14 0429 02/02/14 0500 02/03/14 0435  NA 140 141 145 143 145  K 3.7 3.4* 3.6* 3.6* 3.3*  CL 100 99 104 102 105  CO2 23 22 22 23 24   GLUCOSE 147* 182* 97 116* 166*  BUN 34* 35* 36* 38* 37*  CREATININE 6.99* 7.40* 7.85* 7.71* 6.96*  CALCIUM 8.5 8.5 8.6 8.6 8.1*  MG  --   --  2.3 2.5 2.4  PHOS 6.6* 6.8*  --   --   --    Liver Function Tests:  Recent Labs Lab 01/30/14 0400 01/31/14 0500 02/01/14 0429 02/02/14 0500 02/03/14 0435  AST  --   --  31 38* 32  ALT  --   --  8 11 12   ALKPHOS  --   --  86 87 82  BILITOT  --   --  <0.2*  <0.2* 0.2*  PROT  --   --  6.5 6.7 6.5  ALBUMIN 1.6* 1.7* 1.7* 1.9* 1.7*   No results found for this basename: LIPASE, AMYLASE,  in the last 168 hours No results found for this basename: AMMONIA,  in the last 168 hours CBC:  Recent Labs Lab 01/28/14 0229 01/29/14 0235 01/30/14 0400 02/01/14 0429 02/02/14 0500 02/03/14 0435  WBC 11.4* 10.3 8.7 7.5 9.3 8.4  NEUTROABS 9.6*  --   --  5.3 7.4 6.3  HGB 8.6* 8.4* 8.2* 8.1* 8.7* 8.0*  HCT 25.9* 25.3* 25.1* 25.1* 26.0* 25.1*  MCV 83.3 83.0 82.8 82.6 82.0 83.4  PLT 225 223 274 353 405* 399   Cardiac Enzymes: No results found for this basename: CKTOTAL, CKMB, CKMBINDEX, TROPONINI,  in the last 168 hours BNP (last 3 results)  Recent Labs  10/31/13 1446  PROBNP 253.7*   CBG:  Recent Labs Lab 02/02/14 0706 02/02/14 1158 02/02/14 1644 02/02/14 2213 02/03/14 0639  GLUCAP 110* 142* 106* 238* 161*    Recent Results (from the past  240 hour(s))  CULTURE, BLOOD (ROUTINE X 2)     Status: None   Collection Time    01/24/14  9:11 PM      Result Value Ref Range Status   Specimen Description BLOOD LEFT ARM   Final   Special Requests BOTTLES DRAWN AEROBIC AND ANAEROBIC 10CC EACH   Final   Culture  Setup Time     Final   Value: 01/25/2014 00:41     Performed at [facility]   Culture     Final   Value: GROUP A STREP (S.PYOGENES) ISOLATED     Note: CRITICAL RESULT CALLED TO, READ BACK BY AND VERIFIED WITH: JOHN THOMASSON 01/27/14 0850 BY SMITHERSJ FAXED TO [facility] ATTN BONNIE COLLINS [id number] BY PEAKY     Note: Gram Stain Report Called to,Read Back By and Verified With: THOMAS LILLEY 01/25/14 @ 8:14PM BY RUSCOE A.     Performed at [facility]   Report Status 02/02/2014 FINAL   Final  CULTURE, BLOOD (ROUTINE X 2)     Status: None   Collection Time    01/24/14 10:41 PM      Result Value Ref Range Status   Specimen Description BLOOD LEFT ARM   Final   Special Requests BOTTLES DRAWN AEROBIC AND ANAEROBIC [facility] EACH    Final   Culture  Setup Time     Final   Value: 01/25/2014 14:08     Performed at [facility]   Culture     Final   Value: NO GROWTH 5 DAYS     Performed at [facility]   Report Status 01/31/2014 FINAL   Final  WOUND CULTURE     Status: None   Collection Time    01/25/14  1:48 AM      Result Value Ref Range Status   Specimen Description WOUND BACK   Final   Special Requests NONE   Final   Gram Stain     Final   Value: ABUNDANT WBC PRESENT,BOTH PMN AND MONONUCLEAR     FEW GRAM POSITIVE COCCI IN PAIRS     RARE GRAM NEGATIVE RODS     Gram Stain Report Called to,Read Back By and Verified With: Gram Stain Report Called to,Read Back By and Verified With: Denyse Amass RN [id number] BY GREEN R Performed at [facility]     Performed at [facility]   Culture     Final   Value: MODERATE ENTEROBACTER AEROGENES     ABUNDANT GROUP A STREP (S.PYOGENES) ISOLATED     Performed at [facility]   Report Status 01/27/2014 FINAL   Final   Organism ID, Bacteria ENTEROBACTER AEROGENES   Final  GRAM STAIN     Status: None   Collection Time    01/25/14  1:48 AM      Result Value Ref Range Status   Specimen Description WOUND BACK   Final   Special Requests NONE   Final   Gram Stain     Final   Value: ABUNDANT WBC PRESENT,BOTH PMN AND MONONUCLEAR     FEW GRAM POSITIVE COCCI IN PAIRS     RARE GRAM NEGATIVE RODS     Gram Stain Report Called to,Read Back By and Verified With: Denyse Amass RN [id number] 1937 GREEN R   Report Status 01/25/2014 FINAL   Final  MRSA PCR SCREENING     Status: None   Collection Time    01/25/14  3:05 AM      Result Value Ref Range Status   MRSA by PCR NEGATIVE  NEGATIVE Final   Comment:            The GeneXpert MRSA Assay (FDA     approved for NASAL specimens     only), is one component of a     comprehensive MRSA colonization     surveillance program. It is not     intended to diagnose MRSA     infection nor to guide or      monitor treatment for     MRSA infections.  URINE CULTURE     Status: None   Collection Time    01/25/14  3:30 AM      Result Value Ref Range Status   Specimen Description URINE, CATHETERIZED   Final   Special Requests NONE   Final   Culture  Setup Time     Final   Value: 01/25/2014 14:15     Performed at [facility]     Final   Value: >=100,000 COLONIES/ML     Performed at [facility]   Culture     Final   Value: ENTEROCOCCUS SPECIES     Performed at [facility]   Report Status 01/27/2014 FINAL   Final   Organism ID, Bacteria ENTEROCOCCUS SPECIES   Final  CULTURE, BLOOD (ROUTINE X 2)     Status: None   Collection Time    01/26/14  9:50 AM      Result Value Ref Range Status   Specimen Description BLOOD LEFT ANTECUBITAL   Final   Special Requests BOTTLES DRAWN AEROBIC ONLY 3CC   Final   Culture  Setup Time     Final   Value: 01/26/2014 13:59     Performed at [facility]   Culture     Final   Value: NO GROWTH 5 DAYS     Performed at [facility]   Report Status 02/01/2014 FINAL   Final  CULTURE, BLOOD (ROUTINE X 2)     Status: None   Collection Time    01/26/14 10:00 AM      Result Value Ref Range Status   Specimen Description BLOOD RIGHT HAND   Final   Special Requests BOTTLES DRAWN AEROBIC ONLY 2CC   Final   Culture  Setup Time     Final   Value: 01/26/2014 13:58     Performed at [facility]   Culture     Final   Value: NO GROWTH 5 DAYS     Performed at [facility]   Report Status 02/01/2014 FINAL   Final  CLOSTRIDIUM DIFFICILE BY PCR     Status: None   Collection Time    01/28/14  9:35 PM      Result Value Ref Range Status   C difficile by pcr NEGATIVE  NEGATIVE Final     Studies: Mr Lumbar Spine W Wo Contrast  02/03/2014   CLINICAL DATA:  Epidural abscess.  EXAM: MRI LUMBAR SPINE WITHOUT AND WITH CONTRAST  TECHNIQUE: Multiplanar and multiecho pulse sequences of the lumbar spine were  obtained without and with intravenous contrast.  CONTRAST:  32mL MULTIHANCE GADOBENATE DIMEGLUMINE 529 MG/ML IV SOLN  COMPARISON:  CT of the abdomen and pelvis January 29, 2014 and MRI of the lumbar spine December 05, 2013  FINDINGS: Large body habitus results in overall decreased signal to noise  ratio. Patient is status post L5-S1 posterior instrumentation with interbody disc material, suspect L4-5 undersurface laminotomy. On prior CT there was a paraspinal muscles surgical drain which is has likely been removed. Low T1, bright T2 rim enhancing 10.3 x 4.5 x 5.3 cm (cc x transverse x AP) fluid collection which is contiguous with the paraspinal muscles and surgical bed/epidural space. Small amount of low signal air within the fluid collection.  L5-S1 PLIF with resultant susceptibility artifact. Vertebral bodies appear intact and aligned with maintenance of lumbar lordosis. Non surgically altered discs morphology generally preserved with decreased T2 signal within all lumbar disc most consistent mild desiccation, superimposed bright STIR signal within the L1-2 disc may reflect edema associated with moderate acute on chronic enhancing discogenic endplate changes. No suspicious osseous nor intradiscal enhancement.  Conus medullaris terminates at T12-L1 and appears normal morphology and signal characteristics. Central displacement of the cauda equina as described below with no convincing evidence of suspicious leptomeningeal enhancement.  Level by level evaluation:  L1-2: Small right paracentral disc protrusion, endplate spurring, moderate facet arthropathy. Very mild canal stenosis. Mild right neural foraminal narrowing.  L2-3: Small left far lateral disc protrusion, moderate facet arthropathy and ligamentum flavum redundancy without canal stenosis. No neural foraminal narrowing.  L3-4: Mild annular bulging, moderate facet arthropathy and ligamentum flavum redundancy. Mild canal stenosis and mild bilateral neural foraminal  narrowing.  L4-5: Mild annular bulging, please moderate facet arthropathy with trace facet effusions. Moderate canal stenosis without neural foraminal narrowing.  L5-S1: Susceptibility artifact from disc material, in addition, paraspinal fluid collection with suspected severe canal stenosis. Suspect at least moderate left greater than right neural foraminal narrowing, which was present on prior examination.  IMPRESSION: 10.3 x 4.5 x 5.4 cm rim enhancing fluid collection within this paraspinal soft tissues at the level of surgical intervention, status post L5-S1 PLIF. Fluid collection extends to the surgical bed, apparent interval removal of the surgical drain. Considering recent surgical history, this could reflect seroma and contracting hematoma though, abscess may have a similar appearance. Unlikely to reflect a myelomeningocele considering enhancement. Fluid collection and postsurgical changes resultant suspected severe canal stenosis at L5-S1.  No MR findings of discitis osteomyelitis. No acute lumbar spine fractures nor malalignment.  Lumbar spondylosis as described above.   Electronically Signed   By: Elon Alas   On: 02/03/2014 02:08   Dg Abd Acute W/chest  02/01/2014   CLINICAL DATA:  Abd pain  EXAM: ACUTE ABDOMEN SERIES (ABDOMEN 2 VIEW & CHEST 1 VIEW)  COMPARISON:  Portal chest radiograph 01/30/2014  FINDINGS: There is no evidence of dilated bowel loops or free intraperitoneal air. No radiopaque calculi or other significant radiographic abnormality is seen. The heart is enlarged. Left central venous catheter via subclavian approach tip superior vena cava. Persistent density left lung base. No acute osseous abnormalities. Decreased density right lung base.  IMPRESSION: Persistent airspace disease in left lung base. Improved disease right lung base. Nonobstructive bowel gas pattern. No evidence of free air.   Electronically Signed   By: Margaree Mackintosh M.D.   On: 02/01/2014 14:42    Scheduled  Meds: . atorvastatin  80 mg Per Tube QHS  . carvedilol  25 mg Oral BID WC  . cefTRIAXone (ROCEPHIN)  IV  2 g Intravenous Q24H  . furosemide  60 mg Intravenous Daily  . gabapentin  300 mg Oral Daily  . insulin aspart  0-20 Units Subcutaneous TID WC  . multivitamin with minerals  1 tablet Oral Daily  .  pantoprazole  40 mg Oral Daily  . sodium chloride  10-40 mL Intracatheter Q12H  . topiramate  125 mg Per Tube QHS   Continuous Infusions: . sodium chloride 10 mL/hr at 01/28/14 2300    Time spent: 25 minutes  Eulogio Bear  [facility] Pager [phone]. If 7PM-7AM, please contact night-coverage at [email or web address], password [facility] 02/03/2014, 8:23 AM  LOS: 10 days

## 2014-02-03 NOTE — Progress Notes (Signed)
Patient evaluated for community based chronic disease management services with [facility] as a benefit of patient's [employer]. Spoke with patient at bedside to explain [facility] services.  Patient was appreciative of the service information but feels she can manage her care at this time she has been contacted by [facility] and has consented to a transition of care call with them at discharge.  [facility] will not engage at this time.  Left contact information and [facility] literature at bedside. Made Inpatient Case Manager aware that [facility] following. Of note, [facility] Care Management services does not replace or interfere with any services that are arranged by inpatient case management or social work.  For additional questions or referrals please contact Corliss Blacker BSN RN [facility] Liaison at [phone].

## 2014-02-03 NOTE — Progress Notes (Signed)
Pt Foley D/C at 1226, pt DVT by 2023.

## 2014-02-03 NOTE — Care Management Note (Signed)
Page 1 of 2   02/05/2014     9:37:53 AM CARE MANAGEMENT NOTE 02/05/2014  Patient:  Marissa [name],Marissa [name]   Account Number:  [id number]  Date Initiated:  01/29/2014  Documentation initiated by:  WILE,HEATHER  Subjective/Objective Assessment:   Patient admitted with wound drainage, bacteremia. Lives at home with spouse.     Action/Plan:   Will follow for discharge needs pending PT/OT evals and physician orders.  Anticipate need for HH IV ATB at discharge.   Anticipated DC Date:  02/05/2014   Anticipated DC Plan:  HOME W HOME HEALTH SERVICES      DC Planning Services  CM consult      Choice offered to / List presented to:  C-1 Patient        HH arranged  HH-1 RN  IV Antibiotics      HH agency  [facility]   Status of service:  Completed, signed off Medicare Important Message given?  YES (If response is "NO", the following Medicare IM given date fields will be blank) Date Medicare IM given:  02/03/2014 Date Additional Medicare IM given:    Discharge Disposition:  HOME W HOME HEALTH SERVICES  Per UR Regulation:  Reviewed for med. necessity/level of care/duration of stay  If discussed at Long Length of Stay Meetings, dates discussed:   01/29/2014    Comments:  02/05/14 0935 Courtney Robarge RN, MSN, CM- Mary with [facility] was notified that patient has been discharged today. Patient's RN is aware that patient needs to receive today's dose prior to leaving.  HHRN visit for IV therapy is set for tomorrow 02/06/14.   02/04/14 1345 Courtney Robarge RN, MSN, CM- Orders for HH IV ATB recieved from Dr Buriev.  Pam with [facility] notified that orders are in Epic.  02/03/14 0945 Courtney Robarge RN, MSN, CM- Met with patient to provide Medicare IM letter.  Verified with patient that she intends to continue services through [facility] at discharge.  Awaiting antibiotic orders for discharge.   02/02/14 1350 Courtney Robarge RN,MSN, CM- Attempted to meet with patient to provide Medicare IM  letter.  Patient extremely lethargic at this time.  CM spoke with bedside RN and requested to speak with patient if she is noted to be more alert.  Copy of IM was left at bedside.    01/29/14- 1530- Kristi Webster RN, BSN [phone] received pt as tx from ICU- s/p I&D- pt was d/c'd recently with HH services - PT/OT with [facility]- will now need IV abx 6-8 weeks - plan for PICC- monitoring renal function- NCM to follow for d/c needs.   

## 2014-02-03 NOTE — Progress Notes (Signed)
FOLEY REMOVED TODAY, OOB MULTIPLE TIMES TO VOID IN BR, NO COMPLICATION. SUPERVISION WITH WALKER.

## 2014-02-03 NOTE — Progress Notes (Signed)
BACKGROUND [name] with  DM, HTN, normal renal function at baseline (0.95). S/p L5S1 decompression procedure 6/5. Readmitted  6/13 with wound infection started on vanco and zosyn. Hypotension 6/14 into the 80's but no pressors ever required. Blood cultures  + for group A strep. Wound + Group A strep and enterobacter, urine + enterococcus.  Over 24 hours 6/14->6/15 rapid rise in creatinine from 0.95 to 2.8 and continued to rise. Remains non-oliguric, never required HD and now seems creatinine is trending back down.   Blood pressures normal at this time.  Ultrasound raised question of hydro -> CT discounted that.    ASSESSMENT/RECOMMENDATIONS 1.Renal- Acute kidney injury likely a result of ATN from group a strep bacteremia and hypotension. Had a period of oliguria - now non-oliguric.  Creatinine down for the first time today!!  7.8---7.7---6.9    Given fact that she is young and had normal renal function at baseline her prognosis for ultimate recovery should be good. We seem to be seeing true improvement today with creatinine down nearly a gram.  Will D/C foley and follow 2. Hypertension/volume - Hypotension has resolved and she is now hypertensive. On max dose carvedilol but refusing it at times. Okay to continue this at this time. Lasix daily was added to her regimen 6/18.  She is making good urine- I will change to PO lasix 3. Group A strep bacteremia/wound with GpA strep and enterobacter, urine with enterococcus - ID is following and she is currently on Rocephin- will need until late July 4. Anemia -Likely situational in nature. Supportive care for now  5. Dispo- if creatinine improves tomorrow could consider discharge if otherwise stable if patient will return 2 times per week for labs to insure continued improvement in renal function  Subjective:  Looks much brighter today  Objective Vital signs in last 24 hours: Filed Vitals:   02/02/14 1500 02/02/14 1736 02/02/14 2215 02/03/14 0617  BP:  142/82 143/74 155/80 162/91  Pulse: 75 93 90 91  Temp: 98 F (36.7 C) 98.2 F (36.8 C) 99.1 F (37.3 C) 99.3 F (37.4 C)  TempSrc: Axillary Axillary Oral Oral  Resp: 18 18 18 18   Height:      Weight:      SpO2: 99% 98% 97% 100%   Weight change:   Intake/Output Summary (Last 24 hours) at 02/03/14 1126 Last data filed at 02/02/14 1800  Gross per 24 hour  Intake    220 ml  Output   1225 ml  Net  -1005 ml   Physical Exam:  BP 162/91  Pulse 91  Temp(Src) 99.3 F (37.4 C) (Oral)  Resp 18  Ht 5\' 5"  (1.651 m)  Wt 123.424 kg (272 lb 1.6 oz)  BMI 45.28 kg/m2  SpO2 100%  [name], NAD but quite sleepy VS as noted above Lungs clear No rub Abd soft 1+ edema LE's Clear dilute appearing urine in foley    Recent Labs Lab 01/28/14 1600 01/29/14 0235 01/30/14 0400 01/31/14 0500 02/01/14 0429 02/02/14 0500 02/03/14 0435  NA 142 143 140 141 145 143 145  K 3.7 3.7 3.7 3.4* 3.6* 3.6* 3.3*  CL 104 104 100 99 104 102 105  CO2 20 21 23 22 22 23 24   GLUCOSE 175* 201* 147* 182* 97 116* 166*  BUN 31* 32* 34* 35* 36* 38* 37*  CREATININE 5.72* 6.09* 6.99* 7.40* 7.85* 7.71* 6.96*  CALCIUM 8.2* 8.3* 8.5 8.5 8.6 8.6 8.1*  PHOS  --   --  6.6* 6.8*  --   --   --     Recent Labs Lab 02/01/14 0429 02/02/14 0500 02/03/14 0435  AST 31 38* 32  ALT 8 11 12   ALKPHOS 86 87 82  BILITOT <0.2* <0.2* 0.2*  PROT 6.5 6.7 6.5  ALBUMIN 1.7* 1.9* 1.7*    Recent Labs Lab 01/28/14 0229  01/30/14 0400 02/01/14 0429 02/02/14 0500 02/03/14 0435  WBC 11.4*  < > 8.7 7.5 9.3 8.4  NEUTROABS 9.6*  --   --  5.3 7.4 6.3  HGB 8.6*  < > 8.2* 8.1* 8.7* 8.0*  HCT 25.9*  < > 25.1* 25.1* 26.0* 25.1*  MCV 83.3  < > 82.8 82.6 82.0 83.4  PLT 225  < > 274 353 405* 399  < > = values in this interval not displayed.  Recent Labs Lab 02/02/14 0706 02/02/14 1158 02/02/14 1644 02/02/14 2213 02/03/14 0639  GLUCAP 110* 142* 106* 238* 161*   Studies/Results: Mr Lumbar Spine W Wo  Contrast  02/03/2014   CLINICAL DATA:  Epidural abscess.  EXAM: MRI LUMBAR SPINE WITHOUT AND WITH CONTRAST  TECHNIQUE: Multiplanar and multiecho pulse sequences of the lumbar spine were obtained without and with intravenous contrast.  CONTRAST:  22mL MULTIHANCE GADOBENATE DIMEGLUMINE 529 MG/ML IV SOLN  COMPARISON:  CT of the abdomen and pelvis January 29, 2014 and MRI of the lumbar spine December 05, 2013  FINDINGS: Large body habitus results in overall decreased signal to noise ratio. Patient is status post L5-S1 posterior instrumentation with interbody disc material, suspect L4-5 undersurface laminotomy. On prior CT there was a paraspinal muscles surgical drain which is has likely been removed. Low T1, bright T2 rim enhancing 10.3 x 4.5 x 5.3 cm (cc x transverse x AP) fluid collection which is contiguous with the paraspinal muscles and surgical bed/epidural space. Small amount of low signal air within the fluid collection.  L5-S1 PLIF with resultant susceptibility artifact. Vertebral bodies appear intact and aligned with maintenance of lumbar lordosis. Non surgically altered discs morphology generally preserved with decreased T2 signal within all lumbar disc most consistent mild desiccation, superimposed bright STIR signal within the L1-2 disc may reflect edema associated with moderate acute on chronic enhancing discogenic endplate changes. No suspicious osseous nor intradiscal enhancement.  Conus medullaris terminates at T12-L1 and appears normal morphology and signal characteristics. Central displacement of the cauda equina as described below with no convincing evidence of suspicious leptomeningeal enhancement.  Level by level evaluation:  L1-2: Small right paracentral disc protrusion, endplate spurring, moderate facet arthropathy. Very mild canal stenosis. Mild right neural foraminal narrowing.  L2-3: Small left far lateral disc protrusion, moderate facet arthropathy and ligamentum flavum redundancy without canal  stenosis. No neural foraminal narrowing.  L3-4: Mild annular bulging, moderate facet arthropathy and ligamentum flavum redundancy. Mild canal stenosis and mild bilateral neural foraminal narrowing.  L4-5: Mild annular bulging, please moderate facet arthropathy with trace facet effusions. Moderate canal stenosis without neural foraminal narrowing.  L5-S1: Susceptibility artifact from disc material, in addition, paraspinal fluid collection with suspected severe canal stenosis. Suspect at least moderate left greater than right neural foraminal narrowing, which was present on prior examination.  IMPRESSION: 10.3 x 4.5 x 5.4 cm rim enhancing fluid collection within this paraspinal soft tissues at the level of surgical intervention, status post L5-S1 PLIF. Fluid collection extends to the surgical bed, apparent interval removal of the surgical drain. Considering recent surgical history, this could reflect seroma and contracting hematoma though, abscess may have a  similar appearance. Unlikely to reflect a myelomeningocele considering enhancement. Fluid collection and postsurgical changes resultant suspected severe canal stenosis at L5-S1.  No MR findings of discitis osteomyelitis. No acute lumbar spine fractures nor malalignment.  Lumbar spondylosis as described above.   Electronically Signed   By: Elon Alas   On: 02/03/2014 02:08   Dg Abd Acute W/chest  02/01/2014   CLINICAL DATA:  Abd pain  EXAM: ACUTE ABDOMEN SERIES (ABDOMEN 2 VIEW & CHEST 1 VIEW)  COMPARISON:  Portal chest radiograph 01/30/2014  FINDINGS: There is no evidence of dilated bowel loops or free intraperitoneal air. No radiopaque calculi or other significant radiographic abnormality is seen. The heart is enlarged. Left central venous catheter via subclavian approach tip superior vena cava. Persistent density left lung base. No acute osseous abnormalities. Decreased density right lung base.  IMPRESSION: Persistent airspace disease in left lung base.  Improved disease right lung base. Nonobstructive bowel gas pattern. No evidence of free air.   Electronically Signed   By: Margaree Mackintosh M.D.   On: 02/01/2014 14:42   Medications: . sodium chloride 10 mL/hr at 01/28/14 2300   . atorvastatin  80 mg Per Tube QHS  . carvedilol  25 mg Oral BID WC  . cefTRIAXone (ROCEPHIN)  IV  2 g Intravenous Q24H  . furosemide  60 mg Intravenous Daily  . gabapentin  300 mg Oral Daily  . insulin aspart  0-20 Units Subcutaneous TID WC  . multivitamin with minerals  1 tablet Oral Daily  . pantoprazole  40 mg Oral Daily  . potassium chloride  40 mEq Oral Once  . sodium chloride  10-40 mL Intracatheter Q12H  . topiramate  125 mg Per Tube QHS   GOLDSBOROUGH,KELLIE A   02/03/2014, 11:26 AM

## 2014-02-04 DIAGNOSIS — N189 Chronic kidney disease, unspecified: Secondary | ICD-10-CM

## 2014-02-04 LAB — COMPREHENSIVE METABOLIC PANEL
ALT: 15 U/L (ref 0–35)
AST: 49 U/L — ABNORMAL HIGH (ref 0–37)
Albumin: 2 g/dL — ABNORMAL LOW (ref 3.5–5.2)
Alkaline Phosphatase: 89 U/L (ref 39–117)
BUN: 32 mg/dL — AB (ref 6–23)
CHLORIDE: 103 meq/L (ref 96–112)
CO2: 23 mEq/L (ref 19–32)
Calcium: 8.4 mg/dL (ref 8.4–10.5)
Creatinine, Ser: 5.92 mg/dL — ABNORMAL HIGH (ref 0.50–1.10)
GFR calc non Af Amer: 8 mL/min — ABNORMAL LOW (ref >90)
GFR, EST AFRICAN AMERICAN: 10 mL/min — AB (ref >90)
Glucose, Bld: 159 mg/dL — ABNORMAL HIGH (ref 70–99)
Potassium: 3.4 mEq/L — ABNORMAL LOW (ref 3.7–5.3)
Sodium: 143 mEq/L (ref 137–147)
Total Protein: 7.2 g/dL (ref 6.0–8.3)

## 2014-02-04 LAB — CBC WITH DIFFERENTIAL/PLATELET
BASOS ABS: 0 10*3/uL (ref 0.0–0.1)
BASOS PCT: 1 % (ref 0–1)
Eosinophils Absolute: 0.2 10*3/uL (ref 0.0–0.7)
Eosinophils Relative: 2 % (ref 0–5)
HCT: 26.1 % — ABNORMAL LOW (ref 36.0–46.0)
Hemoglobin: 8.5 g/dL — ABNORMAL LOW (ref 12.0–15.0)
Lymphocytes Relative: 14 % (ref 12–46)
Lymphs Abs: 1.2 10*3/uL (ref 0.7–4.0)
MCH: 27 pg (ref 26.0–34.0)
MCHC: 32.6 g/dL (ref 30.0–36.0)
MCV: 82.9 fL (ref 78.0–100.0)
MONO ABS: 0.7 10*3/uL (ref 0.1–1.0)
Monocytes Relative: 8 % (ref 3–12)
NEUTROS ABS: 6.3 10*3/uL (ref 1.7–7.7)
Neutrophils Relative %: 75 % (ref 43–77)
Platelets: 394 10*3/uL (ref 150–400)
RBC: 3.15 MIL/uL — ABNORMAL LOW (ref 3.87–5.11)
RDW: 14.4 % (ref 11.5–15.5)
WBC: 8.3 10*3/uL (ref 4.0–10.5)

## 2014-02-04 LAB — GLUCOSE, CAPILLARY
GLUCOSE-CAPILLARY: 166 mg/dL — AB (ref 70–99)
Glucose-Capillary: 143 mg/dL — ABNORMAL HIGH (ref 70–99)
Glucose-Capillary: 171 mg/dL — ABNORMAL HIGH (ref 70–99)

## 2014-02-04 LAB — MAGNESIUM: MAGNESIUM: 2.2 mg/dL (ref 1.5–2.5)

## 2014-02-04 MED ORDER — FUROSEMIDE 40 MG PO TABS
40.0000 mg | ORAL_TABLET | Freq: Every day | ORAL | Status: DC
  Administered 2014-02-05: 40 mg via ORAL
  Filled 2014-02-04: qty 1

## 2014-02-04 MED ORDER — CARVEDILOL 25 MG PO TABS: 25.0000 mg | ORAL_TABLET | Freq: Two times a day (BID) | ORAL | Status: DC

## 2014-02-04 MED ORDER — FUROSEMIDE 40 MG PO TABS: 40.0000 mg | ORAL_TABLET | Freq: Every day | ORAL | Status: DC

## 2014-02-04 MED ORDER — DEXTROSE 5 % IV SOLN: 2.0000 g | INTRAVENOUS | Status: AC

## 2014-02-04 MED ORDER — INSULIN ASPART 100 UNIT/ML FLEXPEN: 10.0000 [IU] | PEN_INJECTOR | Freq: Three times a day (TID) | SUBCUTANEOUS | Status: DC

## 2014-02-04 NOTE — Progress Notes (Signed)
Read, reviewed, edited and agree with student's findings and recommendations.  Trudy Kory B. Lamondre Wesche, PT, DPT #[phone]  

## 2014-02-04 NOTE — Progress Notes (Signed)
PT Cancellation Note  Patient Details Name: Marissa [name] MRN: [id number] DOB: May 08, 1976   Cancelled Treatment:    Reason Eval/Treat Not Completed: Other (comment) (pt sleeping soundly, Aunt asked me to check back later)   Wells Guiles B. [place], [place], DPT [phone]   02/04/2014, 11:36 AM

## 2014-02-04 NOTE — Progress Notes (Signed)
OT Cancellation Note  Patient Details Name: Marissa [name] MRN: [id number] DOB: 08/11/1976   Cancelled Treatment:    Reason Eval/Treat Not Completed: Patient declined, no reason specified. Pt laying slumped in bed to right side when OT arrived. OT offered to assist pt with bed mobility and functional mobility to address therapeutic goals. Pt kept eyes closed and stated "No. I don't want any visitors." OT explained purpose of therapy and benefit of mobilization and pt stated more forcefully (with eyes remaining shut) "I said I'm not doing any therapy today." OT will check back with pt later this afternoon to re-attempt therapy treatment.   Juluis Rainier [phone] 02/04/2014, 1:37 PM

## 2014-02-04 NOTE — Consult Note (Signed)
[facility] PROGRESS NOTE  Marissa [name] MWU:[id number] DOB: 05-29-1976 DOA: 01/24/2014 PCP: PROVIDER NOT IN SYSTEM  Assessment/Plan: 38 yo BF PMHx DM Type 2, chronic diabetic neuropathy,Hx MI, HTN, HLD, Migraine HA, Obesity, s/p redo decompressive lumbar laminectomy (L5-S1) and posterior lumbar interbody fusion on 01/16/14 presents with sepsis from group a strep bacteremia and post-op wound with group a & enterobacter, AKI -Patient now s/p lumbar wound irrigation and debridement 01/25/2014 on IV atx   1. DM (diabetes mellitus), type 2 with renal complications: CBGs controlled. Continue current regimen   -Pt reports being non adherent to meds at home; reemphasized med compliance   2. Wound infection: group a strep and enterobacter. per ID  3. Severe sepsis with group A strep bacteremia: per ID: Will need 6 wks of IV therapy. Using 6/15 as day 1 of 42 days. End date of July 26.  4. Acute renal failure: per renal  5. History of lumbar laminectomy  6. Obesity  7. Anemia  8. HTN (hypertension): increase coreg as patient now taking;  -reemphasis ed medication adherence    hospital ist will sign off, please call with questions    Code Status: full Family Communication:  D/w patient, her family at the bedside (indicate person spoken with, relationship, and if by phone, the number) Disposition Plan: per primary    Consultants:  ID, nephrology   Procedures:    Antibiotics:  Ceftriaxone  (indicate start date, and stop date if known)  HPI/Subjective: alert  Objective: Filed Vitals:   02/04/14 1010  BP: 120/69  Pulse: 85  Temp: 98.7 F (37.1 C)  Resp: 16    Intake/Output Summary (Last 24 hours) at 02/04/14 1135 Last data filed at 02/03/14 1402  Gross per 24 hour  Intake    240 ml  Output   1100 ml  Net   -860 ml   Filed Weights   01/25/14 0500 01/31/14 1754  Weight: 117.9 kg (259 lb 14.8 oz) 123.424 kg (272 lb 1.6 oz)    Exam:   General:   alert  Cardiovascular: s1,s2 rrr  Respiratory: CTA BL  Abdomen: soft, nt,nd   Musculoskeletal: no LE edema   Data Reviewed: Basic Metabolic Panel:  Recent Labs Lab 01/30/14 0400 01/31/14 0500 02/01/14 0429 02/02/14 0500 02/03/14 0435 02/04/14 0610  NA 140 141 145 143 145 143  K 3.7 3.4* 3.6* 3.6* 3.3* 3.4*  CL 100 99 104 102 105 103  CO2 23 22 22 23 24 23   GLUCOSE 147* 182* 97 116* 166* 159*  BUN 34* 35* 36* 38* 37* 32*  CREATININE 6.99* 7.40* 7.85* 7.71* 6.96* 5.92*  CALCIUM 8.5 8.5 8.6 8.6 8.1* 8.4  MG  --   --  2.3 2.5 2.4 2.2  PHOS 6.6* 6.8*  --   --   --   --    Liver Function Tests:  Recent Labs Lab 01/31/14 0500 02/01/14 0429 02/02/14 0500 02/03/14 0435 02/04/14 0610  AST  --  31 38* 32 49*  ALT  --  8 11 12 15   ALKPHOS  --  86 87 82 89  BILITOT  --  <0.2* <0.2* 0.2* <0.2*  PROT  --  6.5 6.7 6.5 7.2  ALBUMIN 1.7* 1.7* 1.9* 1.7* 2.0*   No results found for this basename: LIPASE, AMYLASE,  in the last 168 hours No results found for this basename: AMMONIA,  in the last 168 hours CBC:  Recent Labs Lab 01/30/14 0400 02/01/14 0429 02/02/14 0500 02/03/14 0435 02/04/14  0610  WBC 8.7 7.5 9.3 8.4 8.3  NEUTROABS  --  5.3 7.4 6.3 6.3  HGB 8.2* 8.1* 8.7* 8.0* 8.5*  HCT 25.1* 25.1* 26.0* 25.1* 26.1*  MCV 82.8 82.6 82.0 83.4 82.9  PLT 274 353 405* 399 394   Cardiac Enzymes: No results found for this basename: CKTOTAL, CKMB, CKMBINDEX, TROPONINI,  in the last 168 hours BNP (last 3 results)  Recent Labs  10/31/13 1446  PROBNP 253.7*   CBG:  Recent Labs Lab 02/03/14 0639 02/03/14 1148 02/03/14 1628 02/03/14 2206 02/04/14 0634  GLUCAP 161* 120* 142* 125* 166*    Recent Results (from the past 240 hour(s))  CULTURE, BLOOD (ROUTINE X 2)     Status: None   Collection Time    01/26/14  9:50 AM      Result Value Ref Range Status   Specimen Description BLOOD LEFT ANTECUBITAL   Final   Special Requests BOTTLES DRAWN AEROBIC ONLY 3CC   Final    Culture  Setup Time     Final   Value: 01/26/2014 13:59     Performed at [facility]   Culture     Final   Value: NO GROWTH 5 DAYS     Performed at [facility]   Report Status 02/01/2014 FINAL   Final  CULTURE, BLOOD (ROUTINE X 2)     Status: None   Collection Time    01/26/14 10:00 AM      Result Value Ref Range Status   Specimen Description BLOOD RIGHT HAND   Final   Special Requests BOTTLES DRAWN AEROBIC ONLY 2CC   Final   Culture  Setup Time     Final   Value: 01/26/2014 13:58     Performed at [facility]   Culture     Final   Value: NO GROWTH 5 DAYS     Performed at [facility]   Report Status 02/01/2014 FINAL   Final  CLOSTRIDIUM DIFFICILE BY PCR     Status: None   Collection Time    01/28/14  9:35 PM      Result Value Ref Range Status   C difficile by pcr NEGATIVE  NEGATIVE Final     Studies: Mr Lumbar Spine W Wo Contrast  02/03/2014   CLINICAL DATA:  Epidural abscess.  EXAM: MRI LUMBAR SPINE WITHOUT AND WITH CONTRAST  TECHNIQUE: Multiplanar and multiecho pulse sequences of the lumbar spine were obtained without and with intravenous contrast.  CONTRAST:  41mL MULTIHANCE GADOBENATE DIMEGLUMINE 529 MG/ML IV SOLN  COMPARISON:  CT of the abdomen and pelvis January 29, 2014 and MRI of the lumbar spine December 05, 2013  FINDINGS: Large body habitus results in overall decreased signal to noise ratio. Patient is status post L5-S1 posterior instrumentation with interbody disc material, suspect L4-5 undersurface laminotomy. On prior CT there was a paraspinal muscles surgical drain which is has likely been removed. Low T1, bright T2 rim enhancing 10.3 x 4.5 x 5.3 cm (cc x transverse x AP) fluid collection which is contiguous with the paraspinal muscles and surgical bed/epidural space. Small amount of low signal air within the fluid collection.  L5-S1 PLIF with resultant susceptibility artifact. Vertebral bodies appear intact and aligned with maintenance of  lumbar lordosis. Non surgically altered discs morphology generally preserved with decreased T2 signal within all lumbar disc most consistent mild desiccation, superimposed bright STIR signal within the L1-2 disc may reflect edema associated with moderate acute on chronic enhancing discogenic  endplate changes. No suspicious osseous nor intradiscal enhancement.  Conus medullaris terminates at T12-L1 and appears normal morphology and signal characteristics. Central displacement of the cauda equina as described below with no convincing evidence of suspicious leptomeningeal enhancement.  Level by level evaluation:  L1-2: Small right paracentral disc protrusion, endplate spurring, moderate facet arthropathy. Very mild canal stenosis. Mild right neural foraminal narrowing.  L2-3: Small left far lateral disc protrusion, moderate facet arthropathy and ligamentum flavum redundancy without canal stenosis. No neural foraminal narrowing.  L3-4: Mild annular bulging, moderate facet arthropathy and ligamentum flavum redundancy. Mild canal stenosis and mild bilateral neural foraminal narrowing.  L4-5: Mild annular bulging, please moderate facet arthropathy with trace facet effusions. Moderate canal stenosis without neural foraminal narrowing.  L5-S1: Susceptibility artifact from disc material, in addition, paraspinal fluid collection with suspected severe canal stenosis. Suspect at least moderate left greater than right neural foraminal narrowing, which was present on prior examination.  IMPRESSION: 10.3 x 4.5 x 5.4 cm rim enhancing fluid collection within this paraspinal soft tissues at the level of surgical intervention, status post L5-S1 PLIF. Fluid collection extends to the surgical bed, apparent interval removal of the surgical drain. Considering recent surgical history, this could reflect seroma and contracting hematoma though, abscess may have a similar appearance. Unlikely to reflect a myelomeningocele considering  enhancement. Fluid collection and postsurgical changes resultant suspected severe canal stenosis at L5-S1.  No MR findings of discitis osteomyelitis. No acute lumbar spine fractures nor malalignment.  Lumbar spondylosis as described above.   Electronically Signed   By: Elon Alas   On: 02/03/2014 02:08    Scheduled Meds: . atorvastatin  80 mg Per Tube QHS  . carvedilol  25 mg Oral BID WC  . cefTRIAXone (ROCEPHIN)  IV  2 g Intravenous Q24H  . [START ON 02/05/2014] furosemide  40 mg Oral Daily  . gabapentin  300 mg Oral Daily  . insulin aspart  0-20 Units Subcutaneous TID WC  . multivitamin with minerals  1 tablet Oral Daily  . pantoprazole  40 mg Oral Daily  . sodium chloride  10-40 mL Intracatheter Q12H  . topiramate  125 mg Per Tube QHS   Continuous Infusions: . sodium chloride 10 mL/hr at 01/28/14 2300    Active Problems:   Wound infection   Acute respiratory failure with hypoxia   Severe sepsis(995.92)   History of lumbar laminectomy   Acute renal failure   Obesity   Anemia   HTN (hypertension)   DM (diabetes mellitus), type 2 with renal complications   Nausea alone   Abdominal pain, unspecified site    Time spent: >35 minutes     Kinnie Feil  [facility] Pager [phone]. If 7PM-7AM, please contact night-coverage at [email or web address], password [facility] 02/04/2014, 11:35 AM  LOS: 11 days

## 2014-02-04 NOTE — Progress Notes (Signed)
BACKGROUND [name] with  DM, HTN, normal renal function at baseline (0.95). S/p L5S1 decompression procedure 6/5. Readmitted  6/13 with wound infection. Hypotension 6/14 into the 80's but no pressors ever required. Blood cultures  + for group A strep. Wound + Group A strep and enterobacter, urine + enterococcus.  Over 24 hours 6/14->6/15 rapid rise in creatinine from 0.95 to 2.8 and continued to rise. Remains non-oliguric, never required HD and now seems creatinine is trending back down.   Blood pressures normal at this time.  Ultrasound raised question of hydro -> CT discounted that.    ASSESSMENT/RECOMMENDATIONS 1.Renal- Acute kidney injury likely a result of ATN from group a strep bacteremia and hypotension. Had a period of oliguria - now non-oliguric.  Creatinine down again today  7.7--->6.9 --->5.9   Given fact that she is young and had normal renal function at baseline her prognosis for ultimate recovery should be good. We seem to be seeing true improvement today with creatinine down nearly another gram.  I would be [facility] for discharge from the hospital from my standpoint.  Would recommend renal panel on 6/26 and 6/29 as OP and to be sent to [facility] fax [phone] to follow continued recovery 2. Hypertension/volume - Hypotension has resolved and she is now hypertensive. On max dose carvedilol but refusing it at times. Blood pressure really all over the place.  Lasix daily was added to her regimen 6/18, now on PO-would send out on 40 daily for 10 days then stop 3. Group A strep bacteremia/wound with GpA strep and enterobacter, urine with enterococcus - ID is following and she is currently on Rocephin- will need until late July 4. Anemia -Likely situational in nature. Supportive care for now  5. Dispo- as above I am OK with patient to be discharged- labs as above and we can order more through our office.  As I anticipate full recovery will not make renal specific follow up but will see her if creatinine  stalls. Renal will sign off, call with questions   Subjective:  Looks much brighter today  Objective Vital signs in last 24 hours: Filed Vitals:   02/04/14 0115 02/04/14 0230 02/04/14 0700 02/04/14 1010  BP:  150/91 189/100 120/69  Pulse:  94 88 85  Temp: 98.3 F (36.8 C)   98.7 F (37.1 C)  TempSrc: Oral   Oral  Resp:  20 18 16   Height:      Weight:      SpO2:  92% 97% 96%   Weight change:   Intake/Output Summary (Last 24 hours) at 02/04/14 1056 Last data filed at 02/03/14 1402  Gross per 24 hour  Intake    240 ml  Output   1100 ml  Net   -860 ml   Physical Exam:  BP 120/69  Pulse 85  Temp(Src) 98.7 F (37.1 C) (Oral)  Resp 16  Ht 5\' 5"  (1.651 m)  Wt 123.424 kg (272 lb 1.6 oz)  BMI 45.28 kg/m2  SpO2 96%  [name], NAD but quite sleepy VS as noted above Lungs clear No rub Abd soft Minimal  edema    Recent Labs Lab 01/29/14 0235 01/30/14 0400 01/31/14 0500 02/01/14 0429 02/02/14 0500 02/03/14 0435 02/04/14 0610  NA 143 140 141 145 143 145 143  K 3.7 3.7 3.4* 3.6* 3.6* 3.3* 3.4*  CL 104 100 99 104 102 105 103  CO2 21 23 22 22 23 24 23   GLUCOSE 201* 147* 182* 97  116* 166* 159*  BUN 32* 34* 35* 36* 38* 37* 32*  CREATININE 6.09* 6.99* 7.40* 7.85* 7.71* 6.96* 5.92*  CALCIUM 8.3* 8.5 8.5 8.6 8.6 8.1* 8.4  PHOS  --  6.6* 6.8*  --   --   --   --     Recent Labs Lab 02/02/14 0500 02/03/14 0435 02/04/14 0610  AST 38* 32 49*  ALT 11 12 15   ALKPHOS 87 82 89  BILITOT <0.2* 0.2* <0.2*  PROT 6.7 6.5 7.2  ALBUMIN 1.9* 1.7* 2.0*    Recent Labs Lab 02/01/14 0429 02/02/14 0500 02/03/14 0435 02/04/14 0610  WBC 7.5 9.3 8.4 8.3  NEUTROABS 5.3 7.4 6.3 6.3  HGB 8.1* 8.7* 8.0* 8.5*  HCT 25.1* 26.0* 25.1* 26.1*  MCV 82.6 82.0 83.4 82.9  PLT 353 405* 399 394    Recent Labs Lab 02/03/14 0639 02/03/14 1148 02/03/14 1628 02/03/14 2206 02/04/14 0634  GLUCAP 161* 120* 142* 125* 166*   Studies/Results: Mr Lumbar Spine W Wo  Contrast  02/03/2014   CLINICAL DATA:  Epidural abscess.  EXAM: MRI LUMBAR SPINE WITHOUT AND WITH CONTRAST  TECHNIQUE: Multiplanar and multiecho pulse sequences of the lumbar spine were obtained without and with intravenous contrast.  CONTRAST:  62mL MULTIHANCE GADOBENATE DIMEGLUMINE 529 MG/ML IV SOLN  COMPARISON:  CT of the abdomen and pelvis January 29, 2014 and MRI of the lumbar spine December 05, 2013  FINDINGS: Large body habitus results in overall decreased signal to noise ratio. Patient is status post L5-S1 posterior instrumentation with interbody disc material, suspect L4-5 undersurface laminotomy. On prior CT there was a paraspinal muscles surgical drain which is has likely been removed. Low T1, bright T2 rim enhancing 10.3 x 4.5 x 5.3 cm (cc x transverse x AP) fluid collection which is contiguous with the paraspinal muscles and surgical bed/epidural space. Small amount of low signal air within the fluid collection.  L5-S1 PLIF with resultant susceptibility artifact. Vertebral bodies appear intact and aligned with maintenance of lumbar lordosis. Non surgically altered discs morphology generally preserved with decreased T2 signal within all lumbar disc most consistent mild desiccation, superimposed bright STIR signal within the L1-2 disc may reflect edema associated with moderate acute on chronic enhancing discogenic endplate changes. No suspicious osseous nor intradiscal enhancement.  Conus medullaris terminates at T12-L1 and appears normal morphology and signal characteristics. Central displacement of the cauda equina as described below with no convincing evidence of suspicious leptomeningeal enhancement.  Level by level evaluation:  L1-2: Small right paracentral disc protrusion, endplate spurring, moderate facet arthropathy. Very mild canal stenosis. Mild right neural foraminal narrowing.  L2-3: Small left far lateral disc protrusion, moderate facet arthropathy and ligamentum flavum redundancy without canal  stenosis. No neural foraminal narrowing.  L3-4: Mild annular bulging, moderate facet arthropathy and ligamentum flavum redundancy. Mild canal stenosis and mild bilateral neural foraminal narrowing.  L4-5: Mild annular bulging, please moderate facet arthropathy with trace facet effusions. Moderate canal stenosis without neural foraminal narrowing.  L5-S1: Susceptibility artifact from disc material, in addition, paraspinal fluid collection with suspected severe canal stenosis. Suspect at least moderate left greater than right neural foraminal narrowing, which was present on prior examination.  IMPRESSION: 10.3 x 4.5 x 5.4 cm rim enhancing fluid collection within this paraspinal soft tissues at the level of surgical intervention, status post L5-S1 PLIF. Fluid collection extends to the surgical bed, apparent interval removal of the surgical drain. Considering recent surgical history, this could reflect seroma and contracting hematoma though, abscess may have  a similar appearance. Unlikely to reflect a myelomeningocele considering enhancement. Fluid collection and postsurgical changes resultant suspected severe canal stenosis at L5-S1.  No MR findings of discitis osteomyelitis. No acute lumbar spine fractures nor malalignment.  Lumbar spondylosis as described above.   Electronically Signed   By: Elon Alas   On: 02/03/2014 02:08   Medications: . sodium chloride 10 mL/hr at 01/28/14 2300   . atorvastatin  80 mg Per Tube QHS  . carvedilol  25 mg Oral BID WC  . cefTRIAXone (ROCEPHIN)  IV  2 g Intravenous Q24H  . furosemide  40 mg Oral BID  . gabapentin  300 mg Oral Daily  . insulin aspart  0-20 Units Subcutaneous TID WC  . multivitamin with minerals  1 tablet Oral Daily  . pantoprazole  40 mg Oral Daily  . sodium chloride  10-40 mL Intracatheter Q12H  . topiramate  125 mg Per Tube QHS   GOLDSBOROUGH,KELLIE A   02/04/2014, 10:56 AM

## 2014-02-05 LAB — CBC WITH DIFFERENTIAL/PLATELET
BASOS ABS: 0 10*3/uL (ref 0.0–0.1)
Basophils Relative: 1 % (ref 0–1)
EOS ABS: 0.3 10*3/uL (ref 0.0–0.7)
EOS PCT: 3 % (ref 0–5)
HCT: 24.7 % — ABNORMAL LOW (ref 36.0–46.0)
Hemoglobin: 7.9 g/dL — ABNORMAL LOW (ref 12.0–15.0)
LYMPHS ABS: 1.3 10*3/uL (ref 0.7–4.0)
Lymphocytes Relative: 16 % (ref 12–46)
MCH: 26.7 pg (ref 26.0–34.0)
MCHC: 32 g/dL (ref 30.0–36.0)
MCV: 83.4 fL (ref 78.0–100.0)
Monocytes Absolute: 0.8 10*3/uL (ref 0.1–1.0)
Monocytes Relative: 10 % (ref 3–12)
Neutro Abs: 5.6 10*3/uL (ref 1.7–7.7)
Neutrophils Relative %: 70 % (ref 43–77)
PLATELETS: 353 10*3/uL (ref 150–400)
RBC: 2.96 MIL/uL — AB (ref 3.87–5.11)
RDW: 14.5 % (ref 11.5–15.5)
WBC: 7.9 10*3/uL (ref 4.0–10.5)

## 2014-02-05 LAB — COMPREHENSIVE METABOLIC PANEL
ALBUMIN: 1.9 g/dL — AB (ref 3.5–5.2)
ALT: 18 U/L (ref 0–35)
AST: 61 U/L — AB (ref 0–37)
Alkaline Phosphatase: 86 U/L (ref 39–117)
BUN: 29 mg/dL — ABNORMAL HIGH (ref 6–23)
CALCIUM: 8.3 mg/dL — AB (ref 8.4–10.5)
CO2: 24 mEq/L (ref 19–32)
Chloride: 104 mEq/L (ref 96–112)
Creatinine, Ser: 5.07 mg/dL — ABNORMAL HIGH (ref 0.50–1.10)
GFR calc non Af Amer: 10 mL/min — ABNORMAL LOW (ref >90)
GFR, EST AFRICAN AMERICAN: 11 mL/min — AB (ref >90)
Glucose, Bld: 205 mg/dL — ABNORMAL HIGH (ref 70–99)
Potassium: 3.1 mEq/L — ABNORMAL LOW (ref 3.7–5.3)
SODIUM: 144 meq/L (ref 137–147)
TOTAL PROTEIN: 6.7 g/dL (ref 6.0–8.3)
Total Bilirubin: <0.2 mg/dL — ABNORMAL LOW (ref 0.3–1.2)

## 2014-02-05 LAB — GLUCOSE, CAPILLARY: GLUCOSE-CAPILLARY: 231 mg/dL — AB (ref 70–99)

## 2014-02-05 LAB — MAGNESIUM: Magnesium: 2.3 mg/dL (ref 1.5–2.5)

## 2014-02-05 MED ORDER — OXYCODONE HCL 5 MG PO TABS: 5.0000 mg | ORAL_TABLET | ORAL | Status: DC | PRN

## 2014-02-05 MED ORDER — HEPARIN SOD (PORK) LOCK FLUSH 100 UNIT/ML IV SOLN
250.0000 [IU] | INTRAVENOUS | Status: AC | PRN
  Administered 2014-02-05: 250 [IU]

## 2014-02-05 NOTE — Progress Notes (Signed)
D/C orders received. Pt educated on d/c instructions and given d/c packet. Pt handed prescriptions. Pt verbalized understanding. Pt taken downstairs by staff via wheelchair.

## 2014-02-05 NOTE — Discharge Instructions (Signed)
No lifting no bending no twisting no driving a riding a car that she's come back and forth to see me.

## 2014-02-05 NOTE — Progress Notes (Signed)
Patient ID: Marissa [name], female   DOB: [birth year], 38 y.o.   MRN: [id number] Patient doing well with minimal pain angling without the walker stable for discharge home

## 2014-02-05 NOTE — Discharge Summary (Signed)
Physician Discharge Summary  Patient ID: Marissa [name] MRN: [id number] DOB/AGE: Aug 26, 1975 38 y.o.  Admit date: 01/24/2014 Discharge date: 02/05/2014  Admission Diagnoses: Postop wound infection with sepsis group A and Enterobacter Discharge Diagnoses: Same Active Problems:   Wound infection   Acute respiratory failure with hypoxia   Severe sepsis(995.92)   History of lumbar laminectomy   Acute renal failure   Obesity   Anemia   HTN (hypertension)   DM (diabetes mellitus), type 2 with renal complications   Nausea alone   Abdominal pain, unspecified site   Discharged Condition: good  Hospital Course: Patient admitted hospital emergency the ER underwent emergent washing out of an I&D of her lumbar wound patient presented with group A sepsis was stabilized in the ICU when the acute renal failure was seen by nephrology critical-care medicine infectious disease and progressively with Marissa [name] and her antibiotics was stabilized with improving kidney function of the lateral part of her hospital stay. She is at the time of discharge ambulating voiding spontaneously tolerating a regular diet and stable for discharge home on oxycodone as well as Vancocin ceftriaxone. Patient be scheduled followup of both nephrology and infectious disease and neurosurgery.  Consults: Significant Diagnostic Studies: Treatments: I&D lumbar wound PICC line placement Discharge Exam: Blood pressure 155/88, pulse 90, temperature 98.9 F (37.2 C), temperature source Oral, resp. rate 18, height 5\' 5"  (1.651 m), weight 123.424 kg (272 lb 1.6 oz), SpO2 98.00%. Strength out of 5 wound clean and dry  Disposition: Home  Discharge Instructions   Care order/instruction    Complete by:  As directed   Please confirm all home health antibiotics arranged per ID            Medication List         amitriptyline 100 MG tablet  Commonly known as:  ELAVIL  Take 100 mg by mouth at bedtime.     atorvastatin 80 MG  tablet  Commonly known as:  LIPITOR  Take 80 mg by mouth at bedtime.     carvedilol 25 MG tablet  Commonly known as:  COREG  Take 1 tablet (25 mg total) by mouth 2 (two) times daily with a meal.     cefTRIAXone 2 g in dextrose 5 % 50 mL  Inject 2 g into the vein daily.     furosemide 40 MG tablet  Commonly known as:  LASIX  Take 1 tablet (40 mg total) by mouth daily.     gabapentin 300 MG capsule  Commonly known as:  NEURONTIN  Take 600 mg by mouth 3 (three) times daily.     insulin aspart 100 UNIT/ML FlexPen  Commonly known as:  NOVOLOG FLEXPEN  Inject 10-20 Units into the skin 3 (three) times daily with meals. Sliding scale     nitroGLYCERIN 0.4 MG SL tablet  Commonly known as:  NITROSTAT  Place 0.4 mg under the tongue every 5 (five) minutes as needed for chest pain.     oxyCODONE 5 MG immediate release tablet  Commonly known as:  Oxy IR/ROXICODONE  Take 1 tablet (5 mg total) by mouth every 4 (four) hours as needed for moderate pain.     SUMAtriptan 50 MG tablet  Commonly known as:  IMITREX  Take 50 mg by mouth every 2 (two) hours as needed for migraine or headache. May repeat in 2 hours if headache persists or recurs.     tiZANidine 4 MG tablet  Commonly known as:  ZANAFLEX  Take 1 tablet (4  mg total) by mouth 3 (three) times daily.     topiramate 100 MG tablet  Commonly known as:  TOPAMAX  Take 100 mg by mouth at bedtime. Take with 25 mg tablet for a 125 mg dose     topiramate 25 MG tablet  Commonly known as:  TOPAMAX  Take 25 mg by mouth at bedtime. Take with 100 mg tablet for a 125 mg dose     VICTOZA 18 MG/3ML Sopn  Generic drug:  Liraglutide  Inject 1.8 mg into the skin daily.           Follow-up Information   Follow up with [facility] P, MD.   Specialty:  Neurosurgery   Contact information:   [address] [place] [address] [phone]       Signed: CRAM,GARY P 02/05/2014, 7:46 AM

## 2014-02-08 ENCOUNTER — Inpatient Hospital Stay: Payer: Self-pay | Admitting: Family Medicine

## 2014-02-08 LAB — COMPREHENSIVE METABOLIC PANEL
ALT: 34 U/L (ref 12–78)
AST: 74 U/L — AB (ref 15–37)
Albumin: 2 g/dL — ABNORMAL LOW (ref 3.4–5.0)
Alkaline Phosphatase: 93 U/L
Anion Gap: 9 (ref 7–16)
BUN: 12 mg/dL (ref 7–18)
Bilirubin,Total: 0.4 mg/dL (ref 0.2–1.0)
Calcium, Total: 8.2 mg/dL — ABNORMAL LOW (ref 8.5–10.1)
Chloride: 107 mmol/L (ref 98–107)
Co2: 22 mmol/L (ref 21–32)
Creatinine: 2.47 mg/dL — ABNORMAL HIGH (ref 0.60–1.30)
EGFR (Non-African Amer.): 24 — ABNORMAL LOW
GFR CALC AF AMER: 28 — AB
Glucose: 286 mg/dL — ABNORMAL HIGH (ref 65–99)
Osmolality: 286 (ref 275–301)
Potassium: 3.4 mmol/L — ABNORMAL LOW (ref 3.5–5.1)
Sodium: 138 mmol/L (ref 136–145)
Total Protein: 7.7 g/dL (ref 6.4–8.2)

## 2014-02-08 LAB — URINALYSIS, COMPLETE
Bacteria: NONE SEEN
Bilirubin,UR: NEGATIVE
Glucose,UR: >=500 mg/dL (ref 0–75)
Ketone: NEGATIVE
LEUKOCYTE ESTERASE: NEGATIVE
Nitrite: NEGATIVE
PH: 8 (ref 4.5–8.0)
Protein: 30
SPECIFIC GRAVITY: 1.007 (ref 1.003–1.030)
Squamous Epithelial: 6
WBC UR: 3 /HPF (ref 0–5)

## 2014-02-08 LAB — CBC
HCT: 29.6 % — ABNORMAL LOW (ref 35.0–47.0)
HGB: 9.8 g/dL — ABNORMAL LOW (ref 12.0–16.0)
MCH: 27.6 pg (ref 26.0–34.0)
MCHC: 33.1 g/dL (ref 32.0–36.0)
MCV: 83 fL (ref 80–100)
Platelet: 303 10*3/uL (ref 150–440)
RBC: 3.55 10*6/uL — ABNORMAL LOW (ref 3.80–5.20)
RDW: 14.6 % — ABNORMAL HIGH (ref 11.5–14.5)
WBC: 10.6 10*3/uL (ref 3.6–11.0)

## 2014-02-08 LAB — TROPONIN I
TROPONIN-I: 0.03 ng/mL
Troponin-I: 0.05 ng/mL

## 2014-02-08 LAB — LIPASE, BLOOD: Lipase: 553 U/L — ABNORMAL HIGH (ref 73–393)

## 2014-02-09 LAB — COMPREHENSIVE METABOLIC PANEL
ALK PHOS: 72 U/L
ANION GAP: 8 (ref 7–16)
Albumin: 1.6 g/dL — ABNORMAL LOW (ref 3.4–5.0)
BUN: 10 mg/dL (ref 7–18)
Bilirubin,Total: 0.2 mg/dL (ref 0.2–1.0)
CREATININE: 2.22 mg/dL — AB (ref 0.60–1.30)
Calcium, Total: 7.6 mg/dL — ABNORMAL LOW (ref 8.5–10.1)
Chloride: 113 mmol/L — ABNORMAL HIGH (ref 98–107)
Co2: 22 mmol/L (ref 21–32)
EGFR (African American): 32 — ABNORMAL LOW
GFR CALC NON AF AMER: 27 — AB
Glucose: 206 mg/dL — ABNORMAL HIGH (ref 65–99)
OSMOLALITY: 290 (ref 275–301)
Potassium: 3.4 mmol/L — ABNORMAL LOW (ref 3.5–5.1)
SGOT(AST): 50 U/L — ABNORMAL HIGH (ref 15–37)
SGPT (ALT): 25 U/L (ref 12–78)
SODIUM: 143 mmol/L (ref 136–145)
Total Protein: 6.1 g/dL — ABNORMAL LOW (ref 6.4–8.2)

## 2014-02-09 LAB — CBC WITH DIFFERENTIAL/PLATELET
BASOS ABS: 0.1 10*3/uL (ref 0.0–0.1)
BASOS PCT: 0.9 %
Eosinophil #: 0.3 10*3/uL (ref 0.0–0.7)
Eosinophil %: 5.1 %
HCT: 26.2 % — ABNORMAL LOW (ref 35.0–47.0)
HGB: 8.5 g/dL — ABNORMAL LOW (ref 12.0–16.0)
Lymphocyte #: 1.3 10*3/uL (ref 1.0–3.6)
Lymphocyte %: 18.3 %
MCH: 27.2 pg (ref 26.0–34.0)
MCHC: 32.7 g/dL (ref 32.0–36.0)
MCV: 83 fL (ref 80–100)
Monocyte #: 0.7 x10 3/mm (ref 0.2–0.9)
Monocyte %: 9.5 %
NEUTROS ABS: 4.5 10*3/uL (ref 1.4–6.5)
NEUTROS PCT: 66.2 %
Platelet: 236 10*3/uL (ref 150–440)
RBC: 3.14 10*6/uL — AB (ref 3.80–5.20)
RDW: 14.7 % — ABNORMAL HIGH (ref 11.5–14.5)
WBC: 6.8 10*3/uL (ref 3.6–11.0)

## 2014-02-09 LAB — TROPONIN I
Troponin-I: 0.04 ng/mL
Troponin-I: 0.04 ng/mL

## 2014-02-10 LAB — CBC WITH DIFFERENTIAL/PLATELET
BASOS ABS: 0.1 10*3/uL (ref 0.0–0.1)
Basophil %: 1.3 %
Eosinophil #: 0.3 10*3/uL (ref 0.0–0.7)
Eosinophil %: 4.2 %
HCT: 23.1 % — AB (ref 35.0–47.0)
HGB: 7.4 g/dL — AB (ref 12.0–16.0)
LYMPHS ABS: 1.4 10*3/uL (ref 1.0–3.6)
Lymphocyte %: 19.8 %
MCH: 26.6 pg (ref 26.0–34.0)
MCHC: 32.1 g/dL (ref 32.0–36.0)
MCV: 83 fL (ref 80–100)
Monocyte #: 0.6 x10 3/mm (ref 0.2–0.9)
Monocyte %: 9.1 %
Neutrophil #: 4.5 10*3/uL (ref 1.4–6.5)
Neutrophil %: 65.6 %
PLATELETS: 176 10*3/uL (ref 150–440)
RBC: 2.79 10*6/uL — ABNORMAL LOW (ref 3.80–5.20)
RDW: 15 % — ABNORMAL HIGH (ref 11.5–14.5)
WBC: 6.8 10*3/uL (ref 3.6–11.0)

## 2014-02-10 LAB — BASIC METABOLIC PANEL
Anion Gap: 7 (ref 7–16)
BUN: 9 mg/dL (ref 7–18)
CALCIUM: 7.7 mg/dL — AB (ref 8.5–10.1)
CHLORIDE: 116 mmol/L — AB (ref 98–107)
CREATININE: 2.34 mg/dL — AB (ref 0.60–1.30)
Co2: 22 mmol/L (ref 21–32)
EGFR (African American): 30 — ABNORMAL LOW
EGFR (Non-African Amer.): 26 — ABNORMAL LOW
Glucose: 194 mg/dL — ABNORMAL HIGH (ref 65–99)
Osmolality: 293 (ref 275–301)
Potassium: 3.8 mmol/L (ref 3.5–5.1)
Sodium: 145 mmol/L (ref 136–145)

## 2014-02-11 ENCOUNTER — Encounter (HOSPITAL_COMMUNITY): Payer: Self-pay | Admitting: Emergency Medicine

## 2014-02-11 ENCOUNTER — Emergency Department (HOSPITAL_COMMUNITY): Payer: Medicare HMO

## 2014-02-11 ENCOUNTER — Emergency Department (HOSPITAL_COMMUNITY)
Admission: EM | Admit: 2014-02-11 | Discharge: 2014-02-12 | Disposition: A | Discharge status: Home / Self Care | Payer: Medicare HMO | Attending: Emergency Medicine | Admitting: Emergency Medicine

## 2014-02-11 DIAGNOSIS — Z79899 Other long term (current) drug therapy: Secondary | ICD-10-CM | POA: Diagnosis not present

## 2014-02-11 DIAGNOSIS — M545 Low back pain, unspecified: Secondary | ICD-10-CM | POA: Diagnosis present

## 2014-02-11 DIAGNOSIS — I10 Essential (primary) hypertension: Secondary | ICD-10-CM | POA: Insufficient documentation

## 2014-02-11 DIAGNOSIS — E785 Hyperlipidemia, unspecified: Secondary | ICD-10-CM | POA: Diagnosis not present

## 2014-02-11 DIAGNOSIS — Z8719 Personal history of other diseases of the digestive system: Secondary | ICD-10-CM | POA: Diagnosis not present

## 2014-02-11 DIAGNOSIS — H547 Unspecified visual loss: Secondary | ICD-10-CM | POA: Insufficient documentation

## 2014-02-11 DIAGNOSIS — Z792 Long term (current) use of antibiotics: Secondary | ICD-10-CM | POA: Insufficient documentation

## 2014-02-11 DIAGNOSIS — L7682 Other postprocedural complications of skin and subcutaneous tissue: Secondary | ICD-10-CM

## 2014-02-11 DIAGNOSIS — R000 Tachycardia, unspecified: Secondary | ICD-10-CM | POA: Insufficient documentation

## 2014-02-11 DIAGNOSIS — G589 Mononeuropathy, unspecified: Secondary | ICD-10-CM | POA: Insufficient documentation

## 2014-02-11 DIAGNOSIS — I252 Old myocardial infarction: Secondary | ICD-10-CM | POA: Insufficient documentation

## 2014-02-11 DIAGNOSIS — Z794 Long term (current) use of insulin: Secondary | ICD-10-CM | POA: Diagnosis not present

## 2014-02-11 DIAGNOSIS — E1139 Type 2 diabetes mellitus with other diabetic ophthalmic complication: Secondary | ICD-10-CM | POA: Insufficient documentation

## 2014-02-11 DIAGNOSIS — I2510 Atherosclerotic heart disease of native coronary artery without angina pectoris: Secondary | ICD-10-CM | POA: Insufficient documentation

## 2014-02-11 DIAGNOSIS — Z9889 Other specified postprocedural states: Secondary | ICD-10-CM | POA: Diagnosis not present

## 2014-02-11 DIAGNOSIS — G8918 Other acute postprocedural pain: Secondary | ICD-10-CM | POA: Insufficient documentation

## 2014-02-11 LAB — CBC WITH DIFFERENTIAL/PLATELET
BASOS PCT: 1.2 %
Basophil #: 0.1 10*3/uL (ref 0.0–0.1)
EOS ABS: 0.3 10*3/uL (ref 0.0–0.7)
EOS PCT: 4.3 %
HCT: 24.6 % — ABNORMAL LOW (ref 35.0–47.0)
HGB: 8 g/dL — ABNORMAL LOW (ref 12.0–16.0)
LYMPHS PCT: 15.8 %
Lymphocyte #: 1.1 10*3/uL (ref 1.0–3.6)
MCH: 27.5 pg (ref 26.0–34.0)
MCHC: 32.4 g/dL (ref 32.0–36.0)
MCV: 85 fL (ref 80–100)
Monocyte #: 0.7 x10 3/mm (ref 0.2–0.9)
Monocyte %: 9.5 %
NEUTROS ABS: 4.8 10*3/uL (ref 1.4–6.5)
Neutrophil %: 69.2 %
Platelet: 182 10*3/uL (ref 150–440)
RBC: 2.9 10*6/uL — ABNORMAL LOW (ref 3.80–5.20)
RDW: 15.2 % — ABNORMAL HIGH (ref 11.5–14.5)
WBC: 6.9 10*3/uL (ref 3.6–11.0)

## 2014-02-11 LAB — BASIC METABOLIC PANEL
ANION GAP: 5 — AB (ref 7–16)
BUN: 8 mg/dL (ref 7–18)
CO2: 21 mmol/L (ref 21–32)
Calcium, Total: 7.6 mg/dL — ABNORMAL LOW (ref 8.5–10.1)
Chloride: 117 mmol/L — ABNORMAL HIGH (ref 98–107)
Creatinine: 2.27 mg/dL — ABNORMAL HIGH (ref 0.60–1.30)
EGFR (African American): 31 — ABNORMAL LOW
EGFR (Non-African Amer.): 27 — ABNORMAL LOW
Glucose: 277 mg/dL — ABNORMAL HIGH (ref 65–99)
OSMOLALITY: 293 (ref 275–301)
POTASSIUM: 4.3 mmol/L (ref 3.5–5.1)
Sodium: 143 mmol/L (ref 136–145)

## 2014-02-11 MED ORDER — ONDANSETRON HCL 4 MG/2ML IJ SOLN
4.0000 mg | Freq: Once | INTRAMUSCULAR | Status: AC
  Administered 2014-02-11: 4 mg via INTRAVENOUS
  Filled 2014-02-11: qty 2

## 2014-02-11 MED ORDER — MORPHINE SULFATE 4 MG/ML IJ SOLN
8.0000 mg | Freq: Once | INTRAMUSCULAR | Status: AC
  Administered 2014-02-11: 8 mg via INTRAVENOUS
  Filled 2014-02-11: qty 2

## 2014-02-11 NOTE — ED Notes (Signed)
The tech applied a dressing to the incision, to control the bleeding. The tech has reported to the RN in charge.

## 2014-02-11 NOTE — ED Notes (Signed)
Dr. Shepard at bedside.  

## 2014-02-11 NOTE — ED Notes (Signed)
picc placement confirmed by Dr Alvino Chapel - received ok to use.

## 2014-02-11 NOTE — ED Notes (Signed)
Pt. reports bleeding at surgical incision " felt a pop" at  lower back this evening , lumbar fusion surgery last 6/5 and wound debridement at the same site last 6/14 .

## 2014-02-11 NOTE — ED Notes (Signed)
Medications awaiting xray result.pt aware.

## 2014-02-12 MED ORDER — PROMETHAZINE HCL 25 MG PO TABS: 25.0000 mg | ORAL_TABLET | Freq: Four times a day (QID) | ORAL | Status: DC | PRN

## 2014-02-12 NOTE — ED Notes (Signed)
Spoke with IV team - pt does not need heparin through picc line. Pt states that she has IV abx for tomorrow. picc line flushed and has good blood return.

## 2014-02-13 LAB — CULTURE, BLOOD (SINGLE)

## 2014-02-13 NOTE — ED Provider Notes (Signed)
I saw and evaluated the patient, reviewed the resident's note and I agree with the findings and plan.   EKG Interpretation None     Patient post spinal surgery that was complicated by infection and sepsis. States she felt a pop in her back with some fluid drainage. There is no gross wound dehiscence. And patient will followup with neurosurgery  Jasper Riling. Alvino Chapel, MD 02/13/14 [id number]

## 2014-02-13 NOTE — ED Provider Notes (Signed)
CSN: [id number]     Arrival date & time 02/11/14  2213 History   First MD Initiated Contact with Patient 02/11/14 2230     Chief Complaint  Patient presents with  . Post-op Problem     (Consider location/radiation/quality/duration/timing/severity/associated sxs/prior Treatment) Patient is a 38 y.o. female presenting with back pain.  Back Pain Location:  Lumbar spine Quality:  Aching Radiates to:  Does not radiate Pain severity:  Mild Onset quality:  Sudden Timing:  Constant Associated symptoms: no abdominal pain, no chest pain, no dysuria and no headaches    38 y/o female with past medical history of DM and spinal surgery with prolonged post operative course and infection of site presents with pain and bloody discharge from the suture site. Patient states that she was going to lay in bed when she heard a pop and then bloody sanguinous fluid started to come out of her suture site. The patient denies fever/chills, weakness, numbness of lower extremities. The discharge did stop with pressure to the site. Patient state that she is on IV antibiotics currently and has been compliant with appointments.   Past Medical History  Diagnosis Date  . Hypertension   . Coronary artery disease   . Neuropathy   . Headache(784.0)   . Arthritis   . Vision loss     due to diabetes  . Myocardial infarction 2006    "due to medication"; no evidence of ischemia or infarction by nuclear stress test '11  . Dysrhythmia     tachycardia  . Diabetes mellitus without complication     fasting cbg 50-140s  . GERD (gastroesophageal reflux disease)     otc meds  . Migraines     once/month maybe - can last up to two weeks  . Hyperlipemia    Past Surgical History  Procedure Laterality Date  . Back surgery  2011    Lumbar   . Cervical fusion  2006  . Salpingoophorectomy Bilateral     1 1997. 2nd 2001  . Cardiac catheterization  07/18/2004    50-60% mid LAD, minor luminal irregularities RCA, normal LM and  CX, EF 50-55% [facility])  . Lumbar laminectomy/decompression microdiscectomy Right 05/07/2013    Procedure: Right Lumbar one-two laminectomy;  Surgeon: Elaina Hoops, MD;  Location: [facility];  Service: Neurosurgery;  Laterality: Right;  . Carpel tunnel Bilateral   . Lumbar laminectomy/decompression microdiscectomy Left 10/29/2013    Procedure: Left Lumbar five-Sacral one Laminectomy;  Surgeon: Elaina Hoops, MD;  Location: [facility];  Service: Neurosurgery;  Laterality: Left;  . Lumbar wound debridement N/A 01/25/2014    Procedure: LUMBAR WOUND DEBRIDEMENT;  Surgeon: Charlie Pitter, MD;  Location: [facility];  Service: Neurosurgery;  Laterality: N/A;   No family history on file. History  Substance Use Topics  . Smoking status: Never Smoker   . Smokeless tobacco: Not on file  . Alcohol Use: No   OB History   Grav Para Term Preterm Abortions TAB SAB Ect Mult Living                 Review of Systems  Constitutional: Negative for activity change.  HENT: Negative for congestion.   Respiratory: Negative for cough and shortness of breath.   Cardiovascular: Negative for chest pain and leg swelling.  Gastrointestinal: Negative for nausea, vomiting, abdominal pain, diarrhea, constipation, blood in stool and abdominal distention.  Genitourinary: Negative for dysuria, flank pain and vaginal discharge.  Musculoskeletal: Positive for  back pain.  Skin: Negative for color change.  Neurological: Negative for syncope and headaches.  Psychiatric/Behavioral: Negative for agitation.      Allergies  Ciprofloxacin; Azithromycin; and Food  Home Medications   Prior to Admission medications   Medication Sig Start Date End Date Taking? Authorizing Provider  amitriptyline (ELAVIL) 100 MG tablet Take 100 mg by mouth at bedtime.     Historical Provider, MD  atorvastatin (LIPITOR) 80 MG tablet Take 80 mg by mouth at bedtime.     Historical Provider, MD  carvedilol (COREG) 25 MG tablet  Take 1 tablet (25 mg total) by mouth 2 (two) times daily with a meal. 02/04/14   Kinnie Feil, MD  cefTRIAXone 2 g in dextrose 5 % 50 mL Inject 2 g into the vein daily. 02/04/14 03/08/14  Kinnie Feil, MD  furosemide (LASIX) 40 MG tablet Take 1 tablet (40 mg total) by mouth daily. 02/05/14   Kinnie Feil, MD  gabapentin (NEURONTIN) 300 MG capsule Take 600 mg by mouth 3 (three) times daily.     Historical Provider, MD  insulin aspart (NOVOLOG FLEXPEN) 100 UNIT/ML FlexPen Inject 10-20 Units into the skin 3 (three) times daily with meals. Sliding scale 02/04/14   Kinnie Feil, MD  Liraglutide (VICTOZA) 18 MG/3ML SOPN Inject 1.8 mg into the skin daily.    Historical Provider, MD  nitroGLYCERIN (NITROSTAT) 0.4 MG SL tablet Place 0.4 mg under the tongue every 5 (five) minutes as needed for chest pain.    Historical Provider, MD  oxyCODONE (OXY IR/ROXICODONE) 5 MG immediate release tablet Take 1 tablet (5 mg total) by mouth every 4 (four) hours as needed for moderate pain. 02/05/14   Elaina Hoops, MD  promethazine (PHENERGAN) 25 MG tablet Take 1 tablet (25 mg total) by mouth every 6 (six) hours as needed for nausea or vomiting. 02/12/14   Claudean Severance, MD  SUMAtriptan (IMITREX) 50 MG tablet Take 50 mg by mouth every 2 (two) hours as needed for migraine or headache. May repeat in 2 hours if headache persists or recurs.    Historical Provider, MD  tiZANidine (ZANAFLEX) 4 MG tablet Take 1 tablet (4 mg total) by mouth 3 (three) times daily. 01/21/14   Eustace Moore, MD  topiramate (TOPAMAX) 100 MG tablet Take 100 mg by mouth at bedtime. Take with 25 mg tablet for a 125 mg dose    Historical Provider, MD  topiramate (TOPAMAX) 25 MG tablet Take 25 mg by mouth at bedtime. Take with 100 mg tablet for a 125 mg dose    Historical Provider, MD   BP 160/92  Pulse 101  Temp(Src) 99.2 F (37.3 C) (Oral)  Resp 25  Ht 5\' 5"  (1.651 m)  Wt 220 lb (99.791 kg)  BMI 36.61 kg/m2  SpO2 96% Physical Exam   Constitutional: She is oriented to person, place, and time. She appears well-developed.  HENT:  Head: Normocephalic.  Eyes: Pupils are equal, round, and reactive to light.  Neck: Neck supple.  Cardiovascular: Normal rate.  Exam reveals no gallop and no friction rub.   No murmur heard. Pulmonary/Chest: Effort normal and breath sounds normal. No respiratory distress.  Abdominal: Soft. She exhibits no distension. There is no tenderness. There is no rebound.  Musculoskeletal: She exhibits no edema.  Sutures noted along L spine area.  Area of opening of incision noted with out erythremia or signs of infection.  No deep structures visualized   Neurological: She is alert and  oriented to person, place, and time.  Skin: Skin is warm.  Psychiatric: She has a normal mood and affect.    ED Course  Procedures (including critical care time) Labs Review Labs Reviewed - No data to display  Imaging Review Dg Chest 1 View  02/11/2014   CLINICAL DATA:  Operative problem.  EXAM: CHEST - 1 VIEW  COMPARISON:  02/01/2014  FINDINGS: There is a left upper extremity PICC which is stable in position, tip at the level of distal SVC.  No cardiomegaly. Upper mediastinal contours unremarkable when accounting for distortion by leftward rotation.  There is worsening aeration of the lower lungs. In the lateral projection, this is a combination of streaky opacities (atelectasis) and posterior airspace opacity. Layering pleural effusions which are small. No pneumothorax.  IMPRESSION: Bibasilar airspace disease with small pleural effusions, primarily concerning for pneumonia. The opacities have increased from 02/01/2014.   Electronically Signed   By: Jorje Guild M.D.   On: 02/11/2014 23:54     EKG Interpretation None      MDM   38 y/o female that presents with incisional pain and leakage from an L spine laminectomy. Patient reports feeling a pop and then having bloody sanguineous fluid discharge prompting  presentation.  On physical exam there is a 1 cm area of insicion opening with no signs of infection of erythremia. No deep structure visualized.  It was determined that the patient was stable for discharge with follow-up as an outpatient with her surgeon. Patient given pain medicated in the department and she states that he has home medication for further symptom control. She was instructed on dressing changes and given materials for home use.  Patient expressed understanding with the plan and she was then discharged home.  Final diagnoses:  Incisional pain       Claudean Severance, MD 02/13/14 [id number]

## 2014-02-16 ENCOUNTER — Emergency Department (HOSPITAL_COMMUNITY)
Admission: EM | Admit: 2014-02-16 | Discharge: 2014-02-17 | Disposition: A | Discharge status: Home / Self Care | Payer: Medicare HMO | Attending: Emergency Medicine | Admitting: Emergency Medicine

## 2014-02-16 ENCOUNTER — Encounter (HOSPITAL_COMMUNITY): Payer: Self-pay | Admitting: Emergency Medicine

## 2014-02-16 DIAGNOSIS — K219 Gastro-esophageal reflux disease without esophagitis: Secondary | ICD-10-CM | POA: Diagnosis not present

## 2014-02-16 DIAGNOSIS — E860 Dehydration: Secondary | ICD-10-CM | POA: Insufficient documentation

## 2014-02-16 DIAGNOSIS — I10 Essential (primary) hypertension: Secondary | ICD-10-CM | POA: Insufficient documentation

## 2014-02-16 DIAGNOSIS — E785 Hyperlipidemia, unspecified: Secondary | ICD-10-CM | POA: Diagnosis not present

## 2014-02-16 DIAGNOSIS — Z79899 Other long term (current) drug therapy: Secondary | ICD-10-CM | POA: Diagnosis not present

## 2014-02-16 DIAGNOSIS — R112 Nausea with vomiting, unspecified: Secondary | ICD-10-CM | POA: Diagnosis present

## 2014-02-16 DIAGNOSIS — E1139 Type 2 diabetes mellitus with other diabetic ophthalmic complication: Secondary | ICD-10-CM | POA: Diagnosis not present

## 2014-02-16 DIAGNOSIS — Z792 Long term (current) use of antibiotics: Secondary | ICD-10-CM | POA: Diagnosis not present

## 2014-02-16 DIAGNOSIS — I2510 Atherosclerotic heart disease of native coronary artery without angina pectoris: Secondary | ICD-10-CM | POA: Diagnosis not present

## 2014-02-16 DIAGNOSIS — Z9889 Other specified postprocedural states: Secondary | ICD-10-CM | POA: Diagnosis not present

## 2014-02-16 DIAGNOSIS — I252 Old myocardial infarction: Secondary | ICD-10-CM | POA: Diagnosis not present

## 2014-02-16 DIAGNOSIS — G43909 Migraine, unspecified, not intractable, without status migrainosus: Secondary | ICD-10-CM | POA: Insufficient documentation

## 2014-02-16 DIAGNOSIS — Z794 Long term (current) use of insulin: Secondary | ICD-10-CM | POA: Diagnosis not present

## 2014-02-16 DIAGNOSIS — M129 Arthropathy, unspecified: Secondary | ICD-10-CM | POA: Insufficient documentation

## 2014-02-16 DIAGNOSIS — H547 Unspecified visual loss: Secondary | ICD-10-CM | POA: Diagnosis not present

## 2014-02-16 DIAGNOSIS — R000 Tachycardia, unspecified: Secondary | ICD-10-CM | POA: Insufficient documentation

## 2014-02-16 LAB — CBC WITH DIFFERENTIAL/PLATELET
Basophils Absolute: 0 10*3/uL (ref 0.0–0.1)
Basophils Relative: 0 % (ref 0–1)
Eosinophils Absolute: 0.2 10*3/uL (ref 0.0–0.7)
Eosinophils Relative: 2 % (ref 0–5)
HCT: 29.7 % — ABNORMAL LOW (ref 36.0–46.0)
Hemoglobin: 9.7 g/dL — ABNORMAL LOW (ref 12.0–15.0)
Lymphocytes Relative: 20 % (ref 12–46)
Lymphs Abs: 1.3 10*3/uL (ref 0.7–4.0)
MCH: 26.7 pg (ref 26.0–34.0)
MCHC: 32.7 g/dL (ref 30.0–36.0)
MCV: 81.8 fL (ref 78.0–100.0)
Monocytes Absolute: 0.7 10*3/uL (ref 0.1–1.0)
Monocytes Relative: 10 % (ref 3–12)
Neutro Abs: 4.6 10*3/uL (ref 1.7–7.7)
Neutrophils Relative %: 68 % (ref 43–77)
Platelets: 199 10*3/uL (ref 150–400)
RBC: 3.63 MIL/uL — ABNORMAL LOW (ref 3.87–5.11)
RDW: 14.8 % (ref 11.5–15.5)
WBC: 6.8 10*3/uL (ref 4.0–10.5)

## 2014-02-16 LAB — COMPREHENSIVE METABOLIC PANEL
ALBUMIN: 2.6 g/dL — AB (ref 3.5–5.2)
ALT: 20 U/L (ref 0–35)
AST: 38 U/L — ABNORMAL HIGH (ref 0–37)
Alkaline Phosphatase: 98 U/L (ref 39–117)
Anion gap: 15 (ref 5–15)
BUN: 3 mg/dL — ABNORMAL LOW (ref 6–23)
CALCIUM: 8.5 mg/dL (ref 8.4–10.5)
CO2: 25 meq/L (ref 19–32)
CREATININE: 1.14 mg/dL — AB (ref 0.50–1.10)
Chloride: 100 mEq/L (ref 96–112)
GFR calc Af Amer: 70 mL/min — ABNORMAL LOW (ref >90)
GFR, EST NON AFRICAN AMERICAN: 60 mL/min — AB (ref >90)
Glucose, Bld: 255 mg/dL — ABNORMAL HIGH (ref 70–99)
Potassium: 3.4 mEq/L — ABNORMAL LOW (ref 3.7–5.3)
SODIUM: 140 meq/L (ref 137–147)
Total Bilirubin: 0.4 mg/dL (ref 0.3–1.2)
Total Protein: 7.3 g/dL (ref 6.0–8.3)

## 2014-02-16 MED ORDER — OXYCODONE-ACETAMINOPHEN 5-325 MG PO TABS
1.0000 | ORAL_TABLET | Freq: Once | ORAL | Status: AC
  Administered 2014-02-16: 1 via ORAL
  Filled 2014-02-16: qty 1

## 2014-02-16 MED ORDER — ONDANSETRON HCL 4 MG/2ML IJ SOLN
4.0000 mg | Freq: Once | INTRAMUSCULAR | Status: DC
  Filled 2014-02-16: qty 2

## 2014-02-16 MED ORDER — ONDANSETRON HCL 4 MG/2ML IJ SOLN
4.0000 mg | Freq: Once | INTRAMUSCULAR | Status: AC
  Administered 2014-02-16: 4 mg via INTRAVENOUS
  Filled 2014-02-16: qty 2

## 2014-02-16 NOTE — ED Notes (Signed)
Patient here with complaint of nausea and vomiting starting Wednesday of last week. Significant history of wound infection post spinal surgery. Patient has PICC line on left and receives IV antibiotics at home. States that she was hospitalized last week for the similar nausea symptoms. Believes that nausea is a result of her ABX regiment.

## 2014-02-16 NOTE — ED Notes (Signed)
Apologized to pt for wait time. Pt reports 8.5/10 pain to back. Pt in NAD. Pain protocol initiated.

## 2014-02-17 ENCOUNTER — Ambulatory Visit: Payer: Self-pay | Admitting: Pain Medicine

## 2014-02-17 DIAGNOSIS — R112 Nausea with vomiting, unspecified: Secondary | ICD-10-CM | POA: Diagnosis not present

## 2014-02-17 LAB — URINALYSIS, ROUTINE W REFLEX MICROSCOPIC
BILIRUBIN URINE: NEGATIVE
Glucose, UA: NEGATIVE mg/dL
Ketones, ur: NEGATIVE mg/dL
Leukocytes, UA: NEGATIVE
Nitrite: NEGATIVE
PH: 7 (ref 5.0–8.0)
Protein, ur: 30 mg/dL — AB
Specific Gravity, Urine: 1.008 (ref 1.005–1.030)
Urobilinogen, UA: 0.2 mg/dL (ref 0.0–1.0)

## 2014-02-17 LAB — URINE MICROSCOPIC-ADD ON

## 2014-02-17 MED ORDER — SODIUM CHLORIDE 0.9 % IV SOLN
1000.0000 mL | Freq: Once | INTRAVENOUS | Status: AC
  Administered 2014-02-17: 1000 mL via INTRAVENOUS

## 2014-02-17 MED ORDER — PROMETHAZINE HCL 25 MG RE SUPP: 25.0000 mg | Freq: Four times a day (QID) | RECTAL | Status: DC | PRN

## 2014-02-17 MED ORDER — METOCLOPRAMIDE HCL 5 MG/ML IJ SOLN
10.0000 mg | Freq: Once | INTRAMUSCULAR | Status: AC
  Administered 2014-02-17: 10 mg via INTRAVENOUS
  Filled 2014-02-17: qty 2

## 2014-02-17 MED ORDER — SODIUM CHLORIDE 0.9 % IV SOLN: 1000.0000 mL | INTRAVENOUS | Status: DC

## 2014-02-17 MED ORDER — DIPHENHYDRAMINE HCL 50 MG/ML IJ SOLN
12.5000 mg | Freq: Once | INTRAMUSCULAR | Status: AC
Start: 2014-02-17 — End: 2014-02-17
  Administered 2014-02-17: 12.5 mg via INTRAVENOUS
  Filled 2014-02-17: qty 1

## 2014-02-17 MED ORDER — METOCLOPRAMIDE HCL 10 MG PO TABS: 10.0000 mg | ORAL_TABLET | Freq: Four times a day (QID) | ORAL | Status: DC | PRN

## 2014-02-17 NOTE — Discharge Instructions (Signed)
Please follow up with your neurosurgeon as scheduled later today, and your primary care doctor in 2-3 days for recheck.

## 2014-02-17 NOTE — ED Provider Notes (Signed)
CSN: [id number]     Arrival date & time 02/16/14  1953 History   First MD Initiated Contact with Patient 02/17/14 0030     Chief Complaint  Patient presents with  . Emesis  . Nausea     (Consider location/radiation/quality/duration/timing/severity/associated sxs/prior Treatment) HPI 38 year old female presents to emergency department with complaint of nausea and vomiting.  Patient reports that she has had nausea and vomiting since starting ceftriaxone a month ago.  Patient was admitted in June for postop infection for laminectomy.  Patient reports that after being discharged from [facility] for the postop infection, she was readmitted to [place] do to nausea and vomiting.  Patient has been home for about a week.  She reports despite Phenergan and Zofran she is still only able to take small amounts of juice and water.  She denies any fever or chills.  She has ongoing pain to her incision site.  She has an appointment to see her neurosurgeon in the morning.  Patient has history of diabetes.  She denies previous history of gastroparesis.  Patient is receiving her ceftriaxone to PICC line daily.  Patient is not taking her insulin or checking her sugars as she has not been able to keep anything down. Past Medical History  Diagnosis Date  . Hypertension   . Coronary artery disease   . Neuropathy   . Headache(784.0)   . Arthritis   . Vision loss     due to diabetes  . Myocardial infarction 2006    "due to medication"; no evidence of ischemia or infarction by nuclear stress test '11  . Dysrhythmia     tachycardia  . Diabetes mellitus without complication     fasting cbg 50-140s  . GERD (gastroesophageal reflux disease)     otc meds  . Migraines     once/month maybe - can last up to two weeks  . Hyperlipemia    Past Surgical History  Procedure Laterality Date  . Back surgery  2011    Lumbar   . Cervical fusion  2006  . Salpingoophorectomy Bilateral     1 1997. 2nd 2001  . Cardiac  catheterization  07/18/2004    50-60% mid LAD, minor luminal irregularities RCA, normal LM and CX, EF 50-55% [facility])  . Lumbar laminectomy/decompression microdiscectomy Right 05/07/2013    Procedure: Right Lumbar one-two laminectomy;  Surgeon: Elaina Hoops, MD;  Location: [place] [facility];  Service: Neurosurgery;  Laterality: Right;  . Carpel tunnel Bilateral   . Lumbar laminectomy/decompression microdiscectomy Left 10/29/2013    Procedure: Left Lumbar five-Sacral one Laminectomy;  Surgeon: Elaina Hoops, MD;  Location: [place] [facility];  Service: Neurosurgery;  Laterality: Left;  . Lumbar wound debridement N/A 01/25/2014    Procedure: LUMBAR WOUND DEBRIDEMENT;  Surgeon: Charlie Pitter, MD;  Location: [place] [facility];  Service: Neurosurgery;  Laterality: N/A;   History reviewed. No pertinent family history. History  Substance Use Topics  . Smoking status: Never Smoker   . Smokeless tobacco: Not on file  . Alcohol Use: No   OB History   Grav Para Term Preterm Abortions TAB SAB Ect Mult Living                 Review of Systems   See History of Present Illness; otherwise all other systems are reviewed and negative  Allergies  Ciprofloxacin; Azithromycin; and Food  Home Medications   Prior to Admission medications   Medication Sig Start Date End Date Taking?  Authorizing Provider  amitriptyline (ELAVIL) 100 MG tablet Take 100 mg by mouth at bedtime.    Yes Historical Provider, MD  atorvastatin (LIPITOR) 80 MG tablet Take 80 mg by mouth at bedtime.    Yes Historical Provider, MD  carvedilol (COREG) 25 MG tablet Take 1 tablet (25 mg total) by mouth 2 (two) times daily with a meal. 02/04/14  Yes Kinnie Feil, MD  cefTRIAXone 2 g in dextrose 5 % 50 mL Inject 2 g into the vein daily. 02/04/14 03/08/14 Yes Kinnie Feil, MD  furosemide (LASIX) 40 MG tablet Take 1 tablet (40 mg total) by mouth daily. 02/05/14  Yes Kinnie Feil, MD  gabapentin (NEURONTIN) 300 MG capsule Take 600 mg by  mouth 3 (three) times daily.    Yes Historical Provider, MD  insulin aspart (NOVOLOG FLEXPEN) 100 UNIT/ML FlexPen Inject 10-20 Units into the skin 3 (three) times daily with meals. Sliding scale 02/04/14  Yes Kinnie Feil, MD  Liraglutide (VICTOZA) 18 MG/3ML SOPN Inject 1.8 mg into the skin daily.   Yes Historical Provider, MD  nitroGLYCERIN (NITROSTAT) 0.4 MG SL tablet Place 0.4 mg under the tongue every 5 (five) minutes as needed for chest pain.   Yes Historical Provider, MD  oxyCODONE (OXY IR/ROXICODONE) 5 MG immediate release tablet Take 1 tablet (5 mg total) by mouth every 4 (four) hours as needed for moderate pain. 02/05/14  Yes Elaina Hoops, MD  promethazine (PHENERGAN) 25 MG tablet Take 1 tablet (25 mg total) by mouth every 6 (six) hours as needed for nausea or vomiting. 02/12/14  Yes Claudean Severance, MD  simvastatin (ZOCOR) 40 MG tablet Take 40 mg by mouth daily. 11/15/09  Yes Historical Provider, MD  SUMAtriptan (IMITREX) 50 MG tablet Take 50 mg by mouth every 2 (two) hours as needed for migraine or headache. May repeat in 2 hours if headache persists or recurs.   Yes Historical Provider, MD  tiZANidine (ZANAFLEX) 4 MG tablet Take 1 tablet (4 mg total) by mouth 3 (three) times daily. 01/21/14  Yes Eustace Moore, MD  topiramate (TOPAMAX) 100 MG tablet Take 100 mg by mouth at bedtime. Take with 25 mg tablet for a 125 mg dose   Yes Historical Provider, MD  topiramate (TOPAMAX) 25 MG tablet Take 25 mg by mouth at bedtime. Take with 100 mg tablet for a 125 mg dose   Yes Historical Provider, MD   BP 145/90  Pulse 102  Temp(Src) 98.1 F (36.7 C) (Oral)  Resp 20  Ht 5\' 5"  (1.651 m)  Wt 220 lb (99.791 kg)  BMI 36.61 kg/m2  SpO2 97% Physical Exam  Nursing note and vitals reviewed. Constitutional: She is oriented to person, place, and time. She appears well-developed and well-nourished. She appears distressed (uncomfortable appearing).  HENT:  Head: Normocephalic and atraumatic.  Right Ear:  External ear normal.  Left Ear: External ear normal.  Nose: Nose normal.  Dry mucous membranes  Eyes: Conjunctivae and EOM are normal. Pupils are equal, round, and reactive to light.  Neck: Normal range of motion. Neck supple. No JVD present. No tracheal deviation present. No thyromegaly present.  Cardiovascular: Regular rhythm, normal heart sounds and intact distal pulses.  Exam reveals no gallop and no friction rub.   No murmur heard. Tachycardia noted  Pulmonary/Chest: Effort normal and breath sounds normal. No stridor. No respiratory distress. She has no wheezes. She has no rales. She exhibits no tenderness.  Abdominal: Soft. Bowel sounds are normal. She  exhibits no distension and no mass. There is no tenderness. There is no rebound and no guarding.  Musculoskeletal: Normal range of motion. She exhibits no edema and no tenderness.  Lymphadenopathy:    She has no cervical adenopathy.  Neurological: She is alert and oriented to person, place, and time. She exhibits normal muscle tone. Coordination normal.  Skin: Skin is warm and dry. No rash noted. No erythema. No pallor.  Psychiatric: She has a normal mood and affect. Her behavior is normal. Judgment and thought content normal.    ED Course  Procedures (including critical care time) Labs Review Labs Reviewed  COMPREHENSIVE METABOLIC PANEL - Abnormal; Notable for the following:    Potassium 3.4 (*)    Glucose, Bld 255 (*)    BUN 3 (*)    Creatinine, Ser 1.14 (*)    Albumin 2.6 (*)    AST 38 (*)    GFR calc non Af Amer 60 (*)    GFR calc Af Amer 70 (*)    All other components within normal limits  CBC WITH DIFFERENTIAL - Abnormal; Notable for the following:    RBC 3.63 (*)    Hemoglobin 9.7 (*)    HCT 29.7 (*)    All other components within normal limits  URINALYSIS, ROUTINE W REFLEX MICROSCOPIC - Abnormal; Notable for the following:    APPearance CLOUDY (*)    Hgb urine dipstick SMALL (*)    Protein, ur 30 (*)    All other  components within normal limits  URINE MICROSCOPIC-ADD ON - Abnormal; Notable for the following:    Squamous Epithelial / LPF MANY (*)    Bacteria, UA FEW (*)    Casts HYALINE CASTS (*)    All other components within normal limits  URINE CULTURE    Imaging Review No results found.   EKG Interpretation None      MDM   Final diagnoses:  Nausea and vomiting, vomiting of unspecified type    39 year old female with nausea and vomiting since having a postop infection in June.  I wonder she has some element of gastroparesis brought by her recent illness.  Prior records reviewed, patient had pretty significant renal failure with creatinines up in 2-7 range.  Labs today look good aside from slightly elevated blood sugar.  Patient received IV fluids here.  Patient does not appear septic at this time.  She appears dehydrated.  We'll prescribe Reglan and rectal Phenergan to help with symptoms.  Would recommend seeing her primary care Dr. and/or gastroenterologist for further workup of this ongoing nausea and vomiting.   3:41 AM Pt has tolerated fluids by mouth.  She reports resolution of nausea.  Plan to d/c home.   Kalman Drape, MD 02/17/14 [phone]

## 2014-02-18 LAB — URINE CULTURE

## 2014-02-25 ENCOUNTER — Encounter (HOSPITAL_COMMUNITY): Payer: Self-pay | Admitting: Emergency Medicine

## 2014-02-25 ENCOUNTER — Encounter (HOSPITAL_COMMUNITY): Payer: Medicare HMO | Admitting: Anesthesiology

## 2014-02-25 ENCOUNTER — Encounter (HOSPITAL_COMMUNITY)
Admission: EM | Disposition: A | Discharge status: Home Health | Payer: Self-pay | Source: Home / Self Care | Attending: Neurosurgery

## 2014-02-25 ENCOUNTER — Emergency Department (HOSPITAL_COMMUNITY): Payer: Medicare HMO

## 2014-02-25 ENCOUNTER — Inpatient Hospital Stay (HOSPITAL_COMMUNITY)
Admission: EM | Admit: 2014-02-25 | Discharge: 2014-03-02 | DRG: 856 | Disposition: A | Discharge status: Home Health | Payer: Medicare HMO | Attending: Neurosurgery | Admitting: Neurosurgery

## 2014-02-25 ENCOUNTER — Emergency Department (HOSPITAL_COMMUNITY): Payer: Medicare HMO | Admitting: Anesthesiology

## 2014-02-25 DIAGNOSIS — E119 Type 2 diabetes mellitus without complications: Secondary | ICD-10-CM | POA: Diagnosis present

## 2014-02-25 DIAGNOSIS — Z981 Arthrodesis status: Secondary | ICD-10-CM

## 2014-02-25 DIAGNOSIS — G061 Intraspinal abscess and granuloma: Secondary | ICD-10-CM | POA: Diagnosis present

## 2014-02-25 DIAGNOSIS — T798XXD Other early complications of trauma, subsequent encounter: Secondary | ICD-10-CM

## 2014-02-25 DIAGNOSIS — I2510 Atherosclerotic heart disease of native coronary artery without angina pectoris: Secondary | ICD-10-CM | POA: Diagnosis present

## 2014-02-25 DIAGNOSIS — M129 Arthropathy, unspecified: Secondary | ICD-10-CM | POA: Diagnosis present

## 2014-02-25 DIAGNOSIS — T847XXD Infection and inflammatory reaction due to other internal orthopedic prosthetic devices, implants and grafts, subsequent encounter: Secondary | ICD-10-CM

## 2014-02-25 DIAGNOSIS — I10 Essential (primary) hypertension: Secondary | ICD-10-CM | POA: Diagnosis present

## 2014-02-25 DIAGNOSIS — K219 Gastro-esophageal reflux disease without esophagitis: Secondary | ICD-10-CM | POA: Diagnosis present

## 2014-02-25 DIAGNOSIS — M549 Dorsalgia, unspecified: Secondary | ICD-10-CM | POA: Diagnosis present

## 2014-02-25 DIAGNOSIS — Y831 Surgical operation with implant of artificial internal device as the cause of abnormal reaction of the patient, or of later complication, without mention of misadventure at the time of the procedure: Secondary | ICD-10-CM | POA: Diagnosis present

## 2014-02-25 DIAGNOSIS — E785 Hyperlipidemia, unspecified: Secondary | ICD-10-CM | POA: Diagnosis present

## 2014-02-25 DIAGNOSIS — T847XXA Infection and inflammatory reaction due to other internal orthopedic prosthetic devices, implants and grafts, initial encounter: Secondary | ICD-10-CM

## 2014-02-25 DIAGNOSIS — I252 Old myocardial infarction: Secondary | ICD-10-CM | POA: Diagnosis not present

## 2014-02-25 DIAGNOSIS — A491 Streptococcal infection, unspecified site: Secondary | ICD-10-CM | POA: Diagnosis present

## 2014-02-25 DIAGNOSIS — T8140XA Infection following a procedure, unspecified, initial encounter: Secondary | ICD-10-CM | POA: Diagnosis present

## 2014-02-25 DIAGNOSIS — Z9889 Other specified postprocedural states: Secondary | ICD-10-CM

## 2014-02-25 DIAGNOSIS — T82838A Hemorrhage of vascular prosthetic devices, implants and grafts, initial encounter: Secondary | ICD-10-CM

## 2014-02-25 DIAGNOSIS — T798XXS Other early complications of trauma, sequela: Secondary | ICD-10-CM

## 2014-02-25 DIAGNOSIS — T847XXS Infection and inflammatory reaction due to other internal orthopedic prosthetic devices, implants and grafts, sequela: Secondary | ICD-10-CM

## 2014-02-25 HISTORY — PX: LUMBAR WOUND DEBRIDEMENT: SHX1988

## 2014-02-25 LAB — CBC WITH DIFFERENTIAL/PLATELET
BASOS ABS: 0 10*3/uL (ref 0.0–0.1)
BASOS PCT: 0 % (ref 0–1)
EOS PCT: 2 % (ref 0–5)
Eosinophils Absolute: 0.2 10*3/uL (ref 0.0–0.7)
HCT: 31.1 % — ABNORMAL LOW (ref 36.0–46.0)
Hemoglobin: 9.9 g/dL — ABNORMAL LOW (ref 12.0–15.0)
LYMPHS PCT: 27 % (ref 12–46)
Lymphs Abs: 2.3 10*3/uL (ref 0.7–4.0)
MCH: 26.3 pg (ref 26.0–34.0)
MCHC: 31.8 g/dL (ref 30.0–36.0)
MCV: 82.7 fL (ref 78.0–100.0)
Monocytes Absolute: 0.5 10*3/uL (ref 0.1–1.0)
Monocytes Relative: 5 % (ref 3–12)
Neutro Abs: 5.6 10*3/uL (ref 1.7–7.7)
Neutrophils Relative %: 66 % (ref 43–77)
PLATELETS: 215 10*3/uL (ref 150–400)
RBC: 3.76 MIL/uL — ABNORMAL LOW (ref 3.87–5.11)
RDW: 15.3 % (ref 11.5–15.5)
WBC: 8.5 10*3/uL (ref 4.0–10.5)

## 2014-02-25 LAB — GLUCOSE, CAPILLARY
Glucose-Capillary: 207 mg/dL — ABNORMAL HIGH (ref 70–99)
Glucose-Capillary: 255 mg/dL — ABNORMAL HIGH (ref 70–99)

## 2014-02-25 LAB — BASIC METABOLIC PANEL
ANION GAP: 18 — AB (ref 5–15)
BUN: 11 mg/dL (ref 6–23)
CO2: 25 meq/L (ref 19–32)
Calcium: 9.2 mg/dL (ref 8.4–10.5)
Chloride: 100 mEq/L (ref 96–112)
Creatinine, Ser: 0.77 mg/dL (ref 0.50–1.10)
GFR calc non Af Amer: >90 mL/min (ref >90)
Glucose, Bld: 145 mg/dL — ABNORMAL HIGH (ref 70–99)
Potassium: 3 mEq/L — ABNORMAL LOW (ref 3.7–5.3)
Sodium: 143 mEq/L (ref 137–147)

## 2014-02-25 LAB — GRAM STAIN

## 2014-02-25 LAB — PROTIME-INR
INR: 1.1 (ref 0.00–1.49)
PROTHROMBIN TIME: 14.2 s (ref 11.6–15.2)

## 2014-02-25 LAB — CBG MONITORING, ED: GLUCOSE-CAPILLARY: 130 mg/dL — AB (ref 70–99)

## 2014-02-25 SURGERY — LUMBAR WOUND DEBRIDEMENT
Anesthesia: General | Site: Back

## 2014-02-25 MED ORDER — LIRAGLUTIDE 18 MG/3ML ~~LOC~~ SOPN
1.8000 mg | PEN_INJECTOR | Freq: Every day | SUBCUTANEOUS | Status: DC
  Administered 2014-03-01 – 2014-03-02 (×2): 1.8 mg via SUBCUTANEOUS
  Filled 2014-02-25: qty 1

## 2014-02-25 MED ORDER — FENTANYL CITRATE 0.05 MG/ML IJ SOLN
INTRAMUSCULAR | Status: AC
  Filled 2014-02-25: qty 5

## 2014-02-25 MED ORDER — PHENYLEPHRINE HCL 10 MG/ML IJ SOLN
INTRAMUSCULAR | Status: DC | PRN
  Administered 2014-02-25 (×4): 80 ug via INTRAVENOUS

## 2014-02-25 MED ORDER — NITROGLYCERIN 0.4 MG SL SUBL: 0.4000 mg | SUBLINGUAL_TABLET | SUBLINGUAL | Status: DC | PRN

## 2014-02-25 MED ORDER — PHENYLEPHRINE HCL 10 MG/ML IJ SOLN
10.0000 mg | INTRAVENOUS | Status: DC | PRN
  Administered 2014-02-25: 25 ug/min via INTRAVENOUS

## 2014-02-25 MED ORDER — ONDANSETRON HCL 4 MG/2ML IJ SOLN
INTRAMUSCULAR | Status: DC | PRN
  Administered 2014-02-25: 4 mg via INTRAVENOUS

## 2014-02-25 MED ORDER — OXYCODONE-ACETAMINOPHEN 5-325 MG PO TABS
1.0000 | ORAL_TABLET | ORAL | Status: DC | PRN
  Administered 2014-02-26: 2 via ORAL
  Administered 2014-02-26: 1 via ORAL
  Administered 2014-02-27 – 2014-02-28 (×4): 2 via ORAL
  Administered 2014-03-02 (×2): 1 via ORAL
  Filled 2014-02-25: qty 1
  Filled 2014-02-25 (×3): qty 2
  Filled 2014-02-25 (×2): qty 1
  Filled 2014-02-25 (×2): qty 2

## 2014-02-25 MED ORDER — SODIUM CHLORIDE 0.9 % IJ SOLN: 3.0000 mL | Freq: Two times a day (BID) | INTRAMUSCULAR | Status: DC

## 2014-02-25 MED ORDER — EPHEDRINE SULFATE 50 MG/ML IJ SOLN
INTRAMUSCULAR | Status: DC | PRN
  Administered 2014-02-25: 10 mg via INTRAVENOUS
  Administered 2014-02-25: 20 mg via INTRAVENOUS

## 2014-02-25 MED ORDER — DIPHENHYDRAMINE HCL 50 MG/ML IJ SOLN
25.0000 mg | Freq: Once | INTRAMUSCULAR | Status: AC
  Administered 2014-02-25: 25 mg via INTRAVENOUS
  Filled 2014-02-25: qty 1

## 2014-02-25 MED ORDER — CARVEDILOL 12.5 MG PO TABS
25.0000 mg | ORAL_TABLET | Freq: Two times a day (BID) | ORAL | Status: DC
  Administered 2014-02-26 – 2014-03-01 (×3): 25 mg via ORAL
  Filled 2014-02-25: qty 1
  Filled 2014-02-25 (×4): qty 2
  Filled 2014-02-25 (×2): qty 1

## 2014-02-25 MED ORDER — SODIUM CHLORIDE 0.9 % IR SOLN
Status: DC | PRN
  Administered 2014-02-25 (×2)

## 2014-02-25 MED ORDER — ACETAMINOPHEN 650 MG RE SUPP: 650.0000 mg | RECTAL | Status: DC | PRN

## 2014-02-25 MED ORDER — SODIUM CHLORIDE 0.9 % IV SOLN
INTRAVENOUS | Status: DC | PRN
  Administered 2014-02-25 (×2): via INTRAVENOUS

## 2014-02-25 MED ORDER — OXYCODONE HCL 5 MG PO TABS
5.0000 mg | ORAL_TABLET | Freq: Once | ORAL | Status: AC | PRN
  Administered 2014-02-25: 5 mg via ORAL

## 2014-02-25 MED ORDER — SIMVASTATIN 40 MG PO TABS
40.0000 mg | ORAL_TABLET | Freq: Every day | ORAL | Status: DC
  Administered 2014-02-27 – 2014-03-02 (×4): 40 mg via ORAL
  Filled 2014-02-25 (×5): qty 1

## 2014-02-25 MED ORDER — PROPOFOL 10 MG/ML IV BOLUS
INTRAVENOUS | Status: AC
  Filled 2014-02-25: qty 20

## 2014-02-25 MED ORDER — 0.9 % SODIUM CHLORIDE (POUR BTL) OPTIME
TOPICAL | Status: DC | PRN
  Administered 2014-02-25: 1000 mL

## 2014-02-25 MED ORDER — PHENOL 1.4 % MT LIQD
1.0000 | OROMUCOSAL | Status: DC | PRN
  Filled 2014-02-25: qty 177

## 2014-02-25 MED ORDER — HYDROMORPHONE HCL PF 1 MG/ML IJ SOLN
0.5000 mg | INTRAMUSCULAR | Status: DC | PRN
  Administered 2014-02-26 – 2014-03-02 (×2): 1 mg via INTRAVENOUS
  Filled 2014-02-25 (×2): qty 1

## 2014-02-25 MED ORDER — OXYCODONE HCL 5 MG PO TABS
ORAL_TABLET | ORAL | Status: AC
  Filled 2014-02-25: qty 1

## 2014-02-25 MED ORDER — DIPHENHYDRAMINE HCL 50 MG/ML IJ SOLN
12.5000 mg | Freq: Once | INTRAMUSCULAR | Status: AC
  Administered 2014-02-25: 12.5 mg via INTRAVENOUS
  Filled 2014-02-25: qty 1

## 2014-02-25 MED ORDER — HYDROMORPHONE HCL PF 1 MG/ML IJ SOLN
INTRAMUSCULAR | Status: AC
  Filled 2014-02-25: qty 2

## 2014-02-25 MED ORDER — TOPIRAMATE 100 MG PO TABS: 100.0000 mg | ORAL_TABLET | Freq: Every day | ORAL | Status: DC

## 2014-02-25 MED ORDER — ONDANSETRON HCL 4 MG/2ML IJ SOLN
INTRAMUSCULAR | Status: AC
  Filled 2014-02-25: qty 2

## 2014-02-25 MED ORDER — OXYCODONE HCL 5 MG PO TABS
5.0000 mg | ORAL_TABLET | ORAL | Status: DC | PRN
  Administered 2014-02-26 – 2014-03-02 (×8): 5 mg via ORAL
  Filled 2014-02-25 (×8): qty 1

## 2014-02-25 MED ORDER — SODIUM CHLORIDE 0.9 % IV SOLN
Freq: Once | INTRAVENOUS | Status: AC
  Administered 2014-02-25: 12:00:00 via INTRAVENOUS

## 2014-02-25 MED ORDER — HEMOSTATIC AGENTS (NO CHARGE) OPTIME
TOPICAL | Status: DC | PRN
Start: 2014-02-25 — End: 2014-02-25
  Administered 2014-02-25: 1 via TOPICAL

## 2014-02-25 MED ORDER — OXYCODONE HCL 5 MG/5ML PO SOLN: 5.0000 mg | Freq: Once | ORAL | Status: AC | PRN

## 2014-02-25 MED ORDER — GLYCOPYRROLATE 0.2 MG/ML IJ SOLN
INTRAMUSCULAR | Status: AC
  Filled 2014-02-25: qty 2

## 2014-02-25 MED ORDER — CYCLOBENZAPRINE HCL 10 MG PO TABS
10.0000 mg | ORAL_TABLET | Freq: Three times a day (TID) | ORAL | Status: DC | PRN
  Administered 2014-02-26: 10 mg via ORAL
  Filled 2014-02-25: qty 1

## 2014-02-25 MED ORDER — ONDANSETRON HCL 4 MG/2ML IJ SOLN
4.0000 mg | INTRAMUSCULAR | Status: DC | PRN
Start: 2014-02-25 — End: 2014-03-02

## 2014-02-25 MED ORDER — OXYCODONE HCL 5 MG/5ML PO SOLN: 5.0000 mg | Freq: Once | ORAL | Status: DC | PRN

## 2014-02-25 MED ORDER — VANCOMYCIN HCL IN DEXTROSE 1-5 GM/200ML-% IV SOLN
1000.0000 mg | Freq: Three times a day (TID) | INTRAVENOUS | Status: DC
  Administered 2014-02-26 – 2014-02-27 (×4): 1000 mg via INTRAVENOUS
  Filled 2014-02-25 (×7): qty 200

## 2014-02-25 MED ORDER — LACTATED RINGERS IV SOLN
INTRAVENOUS | Status: DC | PRN
  Administered 2014-02-25: 19:00:00 via INTRAVENOUS

## 2014-02-25 MED ORDER — ACETAMINOPHEN 325 MG PO TABS
650.0000 mg | ORAL_TABLET | ORAL | Status: DC | PRN
  Administered 2014-03-01: 650 mg via ORAL
  Filled 2014-02-25: qty 2

## 2014-02-25 MED ORDER — INSULIN ASPART 100 UNIT/ML FLEXPEN: 10.0000 [IU] | PEN_INJECTOR | Freq: Three times a day (TID) | SUBCUTANEOUS | Status: DC

## 2014-02-25 MED ORDER — ROCURONIUM BROMIDE 100 MG/10ML IV SOLN
INTRAVENOUS | Status: DC | PRN
  Administered 2014-02-25: 40 mg via INTRAVENOUS

## 2014-02-25 MED ORDER — DEXTROSE 5 % IV SOLN
2.0000 g | Freq: Every day | INTRAVENOUS | Status: DC
  Administered 2014-02-25 – 2014-03-02 (×6): 2 g via INTRAVENOUS
  Filled 2014-02-25 (×6): qty 2

## 2014-02-25 MED ORDER — TOPIRAMATE 100 MG PO TABS
125.0000 mg | ORAL_TABLET | Freq: Every day | ORAL | Status: DC
Start: 2014-02-26 — End: 2014-03-02
  Administered 2014-02-26 – 2014-03-01 (×5): 125 mg via ORAL
  Filled 2014-02-25 (×8): qty 1

## 2014-02-25 MED ORDER — MENTHOL 3 MG MT LOZG
1.0000 | LOZENGE | OROMUCOSAL | Status: DC | PRN
  Filled 2014-02-25: qty 9

## 2014-02-25 MED ORDER — HYDROMORPHONE HCL PF 1 MG/ML IJ SOLN
1.0000 mg | Freq: Once | INTRAMUSCULAR | Status: AC
  Administered 2014-02-25: 1 mg via INTRAVENOUS
  Filled 2014-02-25: qty 1

## 2014-02-25 MED ORDER — TOPIRAMATE 25 MG PO TABS: 25.0000 mg | ORAL_TABLET | Freq: Every day | ORAL | Status: DC

## 2014-02-25 MED ORDER — METOCLOPRAMIDE HCL 10 MG PO TABS
10.0000 mg | ORAL_TABLET | Freq: Four times a day (QID) | ORAL | Status: DC | PRN
  Filled 2014-02-25: qty 1

## 2014-02-25 MED ORDER — SUMATRIPTAN SUCCINATE 50 MG PO TABS
50.0000 mg | ORAL_TABLET | ORAL | Status: DC | PRN
  Filled 2014-02-25: qty 1

## 2014-02-25 MED ORDER — POTASSIUM CHLORIDE IN NACL 20-0.9 MEQ/L-% IV SOLN
INTRAVENOUS | Status: DC
  Administered 2014-02-26: 18:00:00 via INTRAVENOUS
  Administered 2014-02-26: 75 mL/h via INTRAVENOUS
  Administered 2014-03-01: 1000 mL via INTRAVENOUS
  Administered 2014-03-02: 13:00:00 via INTRAVENOUS
  Filled 2014-02-25 (×10): qty 1000

## 2014-02-25 MED ORDER — GABAPENTIN 300 MG PO CAPS
600.0000 mg | ORAL_CAPSULE | Freq: Three times a day (TID) | ORAL | Status: DC
  Administered 2014-02-26 – 2014-03-02 (×13): 600 mg via ORAL
  Filled 2014-02-25 (×10): qty 2
  Filled 2014-02-25: qty 6
  Filled 2014-02-25 (×3): qty 2

## 2014-02-25 MED ORDER — PROPOFOL 10 MG/ML IV BOLUS
INTRAVENOUS | Status: DC | PRN
  Administered 2014-02-25: 50 mg via INTRAVENOUS
  Administered 2014-02-25: 150 mg via INTRAVENOUS

## 2014-02-25 MED ORDER — MIDAZOLAM HCL 2 MG/2ML IJ SOLN
INTRAMUSCULAR | Status: AC
  Filled 2014-02-25: qty 2

## 2014-02-25 MED ORDER — CEFAZOLIN SODIUM 1-5 GM-% IV SOLN
1.0000 g | Freq: Three times a day (TID) | INTRAVENOUS | Status: AC
  Administered 2014-02-26 (×2): 1 g via INTRAVENOUS
  Filled 2014-02-25 (×2): qty 50

## 2014-02-25 MED ORDER — OXYCODONE HCL 5 MG PO TABS: 5.0000 mg | ORAL_TABLET | Freq: Once | ORAL | Status: DC | PRN

## 2014-02-25 MED ORDER — PHENYLEPHRINE 40 MCG/ML (10ML) SYRINGE FOR IV PUSH (FOR BLOOD PRESSURE SUPPORT)
PREFILLED_SYRINGE | INTRAVENOUS | Status: AC
  Filled 2014-02-25: qty 10

## 2014-02-25 MED ORDER — HYDROMORPHONE HCL PF 1 MG/ML IJ SOLN: 1.0000 mg | Freq: Once | INTRAMUSCULAR | Status: DC

## 2014-02-25 MED ORDER — DEXTROSE 5 % IV SOLN: 2.0000 g | INTRAVENOUS | Status: DC

## 2014-02-25 MED ORDER — AMITRIPTYLINE HCL 25 MG PO TABS
100.0000 mg | ORAL_TABLET | Freq: Every day | ORAL | Status: DC
  Administered 2014-02-26 – 2014-03-01 (×5): 100 mg via ORAL
  Filled 2014-02-25: qty 4
  Filled 2014-02-25: qty 1
  Filled 2014-02-25: qty 4
  Filled 2014-02-25: qty 1
  Filled 2014-02-25: qty 4

## 2014-02-25 MED ORDER — PHENYLEPHRINE HCL 10 MG/ML IJ SOLN
INTRAMUSCULAR | Status: AC
  Filled 2014-02-25: qty 1

## 2014-02-25 MED ORDER — SODIUM CHLORIDE 0.9 % IJ SOLN: 3.0000 mL | INTRAMUSCULAR | Status: DC | PRN

## 2014-02-25 MED ORDER — HYDROMORPHONE HCL PF 1 MG/ML IJ SOLN
0.2500 mg | INTRAMUSCULAR | Status: DC | PRN
  Administered 2014-02-25 (×5): 0.5 mg via INTRAVENOUS

## 2014-02-25 MED ORDER — NEOSTIGMINE METHYLSULFATE 10 MG/10ML IV SOLN
INTRAVENOUS | Status: DC | PRN
  Administered 2014-02-25: 3 mg via INTRAVENOUS

## 2014-02-25 MED ORDER — HYDROMORPHONE HCL PF 1 MG/ML IJ SOLN
1.0000 mg | Freq: Once | INTRAMUSCULAR | Status: AC
  Administered 2014-02-25: 1 mg via INTRAMUSCULAR
  Filled 2014-02-25: qty 1

## 2014-02-25 MED ORDER — GLYCOPYRROLATE 0.2 MG/ML IJ SOLN
INTRAMUSCULAR | Status: DC | PRN
  Administered 2014-02-25: 0.4 mg via INTRAVENOUS

## 2014-02-25 MED ORDER — HYDROMORPHONE HCL PF 1 MG/ML IJ SOLN
INTRAMUSCULAR | Status: AC
  Filled 2014-02-25: qty 1

## 2014-02-25 MED ORDER — VANCOMYCIN HCL IN DEXTROSE 1-5 GM/200ML-% IV SOLN
INTRAVENOUS | Status: AC
  Administered 2014-02-25: 1000 mg via INTRAVENOUS
  Filled 2014-02-25: qty 200

## 2014-02-25 MED ORDER — MIDAZOLAM HCL 5 MG/5ML IJ SOLN
INTRAMUSCULAR | Status: DC | PRN
  Administered 2014-02-25 (×2): 2 mg via INTRAVENOUS

## 2014-02-25 MED ORDER — SODIUM CHLORIDE 0.9 % IV SOLN
250.0000 mL | INTRAVENOUS | Status: DC
  Administered 2014-02-28: 250 mL via INTRAVENOUS

## 2014-02-25 MED ORDER — TIZANIDINE HCL 4 MG PO TABS
4.0000 mg | ORAL_TABLET | Freq: Three times a day (TID) | ORAL | Status: DC
  Administered 2014-02-26 – 2014-03-01 (×12): 4 mg via ORAL
  Filled 2014-02-25 (×17): qty 1

## 2014-02-25 MED ORDER — THROMBIN 5000 UNITS EX SOLR
CUTANEOUS | Status: DC | PRN
  Administered 2014-02-25 (×2): 5000 [IU] via TOPICAL

## 2014-02-25 MED ORDER — LORAZEPAM 2 MG/ML IJ SOLN
0.5000 mg | Freq: Once | INTRAMUSCULAR | Status: AC
  Administered 2014-02-25: 0.5 mg via INTRAVENOUS
  Filled 2014-02-25: qty 1

## 2014-02-25 MED ORDER — SODIUM CHLORIDE 0.9 % IJ SOLN
10.0000 mL | INTRAMUSCULAR | Status: DC | PRN
  Administered 2014-03-01: 10 mL
  Administered 2014-03-01: 40 mL
  Administered 2014-03-02: 10 mL

## 2014-02-25 MED ORDER — HYDROMORPHONE HCL PF 1 MG/ML IJ SOLN: 0.2500 mg | INTRAMUSCULAR | Status: DC | PRN

## 2014-02-25 MED ORDER — FENTANYL CITRATE 0.05 MG/ML IJ SOLN
INTRAMUSCULAR | Status: DC | PRN
  Administered 2014-02-25: 50 ug via INTRAVENOUS
  Administered 2014-02-25: 100 ug via INTRAVENOUS
  Administered 2014-02-25 (×2): 50 ug via INTRAVENOUS

## 2014-02-25 MED ORDER — GADOBENATE DIMEGLUMINE 529 MG/ML IV SOLN
20.0000 mL | Freq: Once | INTRAVENOUS | Status: AC
  Administered 2014-02-25: 20 mL via INTRAVENOUS

## 2014-02-25 SURGICAL SUPPLY — 74 items
BAG DECANTER FOR FLEXI CONT (MISCELLANEOUS) ×2 IMPLANT
BENZOIN TINCTURE PRP APPL 2/3 (GAUZE/BANDAGES/DRESSINGS) ×2 IMPLANT
BLADE 10 SAFETY STRL DISP (BLADE) ×2 IMPLANT
BLADE SURG ROTATE 9660 (MISCELLANEOUS) IMPLANT
BRUSH SCRUB EZ PLAIN DRY (MISCELLANEOUS) ×2 IMPLANT
CANISTER SUCT 3000ML (MISCELLANEOUS) ×2 IMPLANT
CONT SPEC 4OZ CLIKSEAL STRL BL (MISCELLANEOUS) ×2 IMPLANT
CORDS BIPOLAR (ELECTRODE) ×2 IMPLANT
DECANTER SPIKE VIAL GLASS SM (MISCELLANEOUS) ×2 IMPLANT
DERMABOND ADVANCED (GAUZE/BANDAGES/DRESSINGS) ×1
DERMABOND ADVANCED .7 DNX12 (GAUZE/BANDAGES/DRESSINGS) ×1 IMPLANT
DRAPE LAPAROTOMY 100X72X124 (DRAPES) ×2 IMPLANT
DRAPE POUCH INSTRU U-SHP 10X18 (DRAPES) ×2 IMPLANT
DRAPE SURG 17X23 STRL (DRAPES) IMPLANT
DRSG OPSITE 4X5.5 SM (GAUZE/BANDAGES/DRESSINGS) ×2 IMPLANT
DRSG OPSITE POSTOP 4X10 (GAUZE/BANDAGES/DRESSINGS) ×2 IMPLANT
ELECT BLADE 6.5 EXT (BLADE) ×2 IMPLANT
ELECT REM PT RETURN 9FT ADLT (ELECTROSURGICAL) ×2
ELECTRODE REM PT RTRN 9FT ADLT (ELECTROSURGICAL) ×1 IMPLANT
EVACUATOR 1/8 PVC DRAIN (DRAIN) IMPLANT
EVACUATOR 3/16  PVC DRAIN (DRAIN) ×1
EVACUATOR 3/16 PVC DRAIN (DRAIN) ×1 IMPLANT
GAUZE SPONGE 4X4 16PLY XRAY LF (GAUZE/BANDAGES/DRESSINGS) IMPLANT
GLOVE BIO SURGEON STRL SZ 6.5 (GLOVE) ×2 IMPLANT
GLOVE BIO SURGEON STRL SZ7 (GLOVE) ×4 IMPLANT
GLOVE BIO SURGEON STRL SZ7.5 (GLOVE) IMPLANT
GLOVE BIO SURGEON STRL SZ8 (GLOVE) ×2 IMPLANT
GLOVE BIO SURGEON STRL SZ8.5 (GLOVE) IMPLANT
GLOVE BIOGEL M 8.0 STRL (GLOVE) IMPLANT
GLOVE BIOGEL PI IND STRL 7.0 (GLOVE) ×2 IMPLANT
GLOVE BIOGEL PI INDICATOR 7.0 (GLOVE) ×2
GLOVE ECLIPSE 6.5 STRL STRAW (GLOVE) IMPLANT
GLOVE ECLIPSE 7.0 STRL STRAW (GLOVE) IMPLANT
GLOVE ECLIPSE 7.5 STRL STRAW (GLOVE) IMPLANT
GLOVE ECLIPSE 8.0 STRL XLNG CF (GLOVE) IMPLANT
GLOVE ECLIPSE 8.5 STRL (GLOVE) IMPLANT
GLOVE EXAM NITRILE LRG STRL (GLOVE) IMPLANT
GLOVE EXAM NITRILE MD LF STRL (GLOVE) IMPLANT
GLOVE EXAM NITRILE XL STR (GLOVE) IMPLANT
GLOVE EXAM NITRILE XS STR PU (GLOVE) IMPLANT
GLOVE INDICATOR 6.5 STRL GRN (GLOVE) IMPLANT
GLOVE INDICATOR 7.0 STRL GRN (GLOVE) IMPLANT
GLOVE INDICATOR 7.5 STRL GRN (GLOVE) IMPLANT
GLOVE INDICATOR 8.0 STRL GRN (GLOVE) IMPLANT
GLOVE INDICATOR 8.5 STRL (GLOVE) ×2 IMPLANT
GLOVE OPTIFIT SS 8.0 STRL (GLOVE) IMPLANT
GLOVE SURG SS PI 6.5 STRL IVOR (GLOVE) IMPLANT
GOWN STRL REUS W/ TWL LRG LVL3 (GOWN DISPOSABLE) ×2 IMPLANT
GOWN STRL REUS W/ TWL XL LVL3 (GOWN DISPOSABLE) ×1 IMPLANT
GOWN STRL REUS W/TWL 2XL LVL3 (GOWN DISPOSABLE) ×2 IMPLANT
GOWN STRL REUS W/TWL LRG LVL3 (GOWN DISPOSABLE) ×2
GOWN STRL REUS W/TWL XL LVL3 (GOWN DISPOSABLE) ×1
KIT BASIN OR (CUSTOM PROCEDURE TRAY) ×2 IMPLANT
KIT ROOM TURNOVER OR (KITS) ×2 IMPLANT
NEEDLE HYPO 25X1 1.5 SAFETY (NEEDLE) IMPLANT
NS IRRIG 1000ML POUR BTL (IV SOLUTION) ×2 IMPLANT
PACK LAMINECTOMY NEURO (CUSTOM PROCEDURE TRAY) ×2 IMPLANT
PATTIES SURGICAL .75X.75 (GAUZE/BANDAGES/DRESSINGS) IMPLANT
SPONGE GAUZE 4X4 12PLY (GAUZE/BANDAGES/DRESSINGS) ×2 IMPLANT
SPONGE LAP 4X18 X RAY DECT (DISPOSABLE) ×2 IMPLANT
SPONGE SURGIFOAM ABS GEL SZ50 (HEMOSTASIS) ×2 IMPLANT
STRIP CLOSURE SKIN 1/2X4 (GAUZE/BANDAGES/DRESSINGS) ×2 IMPLANT
SUT ETHILON 3 0 FSL (SUTURE) ×2 IMPLANT
SUT VIC AB 0 CT1 18XCR BRD8 (SUTURE) ×2 IMPLANT
SUT VIC AB 0 CT1 8-18 (SUTURE) ×2
SUT VIC AB 2-0 CT1 18 (SUTURE) ×4 IMPLANT
SUT VICRYL 4-0 PS2 18IN ABS (SUTURE) ×2 IMPLANT
SWAB CULTURE LIQ STUART DBL (MISCELLANEOUS) ×2 IMPLANT
SYR 20ML ECCENTRIC (SYRINGE) ×2 IMPLANT
SYR CONTROL 10ML LL (SYRINGE) IMPLANT
TOWEL OR 17X24 6PK STRL BLUE (TOWEL DISPOSABLE) ×2 IMPLANT
TOWEL OR 17X26 10 PK STRL BLUE (TOWEL DISPOSABLE) ×2 IMPLANT
TUBE ANAEROBIC SPECIMEN COL (MISCELLANEOUS) ×2 IMPLANT
WATER STERILE IRR 1000ML POUR (IV SOLUTION) ×2 IMPLANT

## 2014-02-25 NOTE — Transfer of Care (Signed)
Immediate Anesthesia Transfer of Care Note  Patient: Marissa [name]  Procedure(s) Performed: Procedure(s): LUMBAR WOUND DEBRIDEMENT (N/A)  Patient Location: PACU  Anesthesia Type:General  Level of Consciousness: awake, alert , oriented and patient cooperative  Airway & Oxygen Therapy: Patient Spontanous Breathing and Patient connected to nasal cannula oxygen  Post-op Assessment: Report given to PACU RN, Post -op Vital signs reviewed and stable and Patient moving all extremities X 4  Post vital signs: Reviewed and stable  Complications: No apparent anesthesia complications

## 2014-02-25 NOTE — ED Provider Notes (Signed)
Care assumed from [facility], [place]. MRI lumbar spine pending. Her neurosurgeon Dr. [name] has been notified pt is in ED. Plan to notify Dr. [name] once MRI complete. Pt possibly to be taken to OR.  MRI results: IMPRESSION: 1. Mild reduction in size of both of the postoperative fluid collections. The larger is in the subcutaneous tissues and has a thick enhancing rind, internal complexity, and internal gas density which may be from being previously drained although abscess is not completely excluded. The lesion in the laminectomy site at L5 spanning between the posterolateral rods continues to be thin-walled but exerts mass effect on the thecal sac and neural foramina from posterior, and is mildly complex. Given the thin walled appearance this is probably a sterile postoperative fluid collection although again, abscess is difficult to totally exclude. 2. There is also prominent narrowing of the thecal sac at the L4-5 level due to facet arthropathy and disc bulge. 3. Moderate impingement at L3-4 and mild impingement at L1-2 due to spondylosis and degenerative disc disease.  Awaiting consult from Dr. [name].  Pt signed out to Delos Haring, PA-C at shift change. Awaiting consult. Dr. [name] paged 3 times over 45 minute time period. Nurse informed our secretary he will call back.  Illene Labrador, PA-C 02/27/14 [phone]

## 2014-02-25 NOTE — ED Provider Notes (Signed)
CSN: [id number]     Arrival date & time 02/25/14  1018 History   First MD Initiated Contact with Patient 02/25/14 1048     Chief Complaint  Patient presents with  . Vascular Access Problem     (Consider location/radiation/quality/duration/timing/severity/associated sxs/prior Treatment) HPI Comments: Patient is a 38 year old female with a history of hypertension, coronary artery disease, diabetes mellitus, and GERD. She is s/p lumbar laminectomy/discectomy by Dr. [name]. Patient with PICC line as she developed postop infection in June as a result of her back surgery. Patient went for washout at this time and has been getting ceftriaxone through her PICC line daily. Patient states that she awoke this morning and noticed bleeding from her PICC line. She states that she is concerned about PICC line malfunction. Patient states that she had a dose of Rocephin through her PICC line on Monday without difficulty. Patient states that she had a fever of 101F yesterday. She believes this is associated with recurrent infection to her surgical site that she noticed a "grape size ball which drained pus" yesterday. No CP, SOB, bowel/bladder incontinence, extremity numbness/weakness, redness or induration to her PICC site.  The history is provided by the patient. No language interpreter was used.    Past Medical History  Diagnosis Date  . Hypertension   . Coronary artery disease   . Neuropathy   . Headache(784.0)   . Arthritis   . Vision loss     due to diabetes  . Myocardial infarction 2006    "due to medication"; no evidence of ischemia or infarction by nuclear stress test '11  . Dysrhythmia     tachycardia  . Diabetes mellitus without complication     fasting cbg 50-140s  . GERD (gastroesophageal reflux disease)     otc meds  . Migraines     once/month maybe - can last up to two weeks  . Hyperlipemia    Past Surgical History  Procedure Laterality Date  . Back surgery  2011    Lumbar   .  Cervical fusion  2006  . Salpingoophorectomy Bilateral     1 1997. 2nd 2001  . Cardiac catheterization  07/18/2004    50-60% mid LAD, minor luminal irregularities RCA, normal LM and CX, EF 50-55% [facility])  . Lumbar laminectomy/decompression microdiscectomy Right 05/07/2013    Procedure: Right Lumbar one-two laminectomy;  Surgeon: Elaina Hoops, MD;  Location: [place] [facility];  Service: Neurosurgery;  Laterality: Right;  . Carpel tunnel Bilateral   . Lumbar laminectomy/decompression microdiscectomy Left 10/29/2013    Procedure: Left Lumbar five-Sacral one Laminectomy;  Surgeon: Elaina Hoops, MD;  Location: [place] [facility];  Service: Neurosurgery;  Laterality: Left;  . Lumbar wound debridement N/A 01/25/2014    Procedure: LUMBAR WOUND DEBRIDEMENT;  Surgeon: Charlie Pitter, MD;  Location: [place] [facility];  Service: Neurosurgery;  Laterality: N/A;   History reviewed. No pertinent family history. History  Substance Use Topics  . Smoking status: Never Smoker   . Smokeless tobacco: Not on file  . Alcohol Use: No   OB History   Grav Para Term Preterm Abortions TAB SAB Ect Mult Living                  Review of Systems  Constitutional: Positive for fever.  Musculoskeletal: Positive for back pain.  Skin:       +bleeding to PICC site in LUE +"grape size ball draining pus" to incision site of low back  Allergies  Ciprofloxacin; Azithromycin; and Food  Home Medications   Prior to Admission medications   Medication Sig Start Date End Date Taking? Authorizing Provider  amitriptyline (ELAVIL) 100 MG tablet Take 100 mg by mouth at bedtime.    Yes Historical Provider, MD  carvedilol (COREG) 25 MG tablet Take 1 tablet (25 mg total) by mouth 2 (two) times daily with a meal. 02/04/14  Yes Kinnie Feil, MD  cefTRIAXone 2 g in dextrose 5 % 50 mL Inject 2 g into the vein daily. 02/04/14 03/08/14 Yes Kinnie Feil, MD  gabapentin (NEURONTIN) 300 MG capsule Take 600 mg by mouth 3 (three)  times daily.    Yes Historical Provider, MD  insulin aspart (NOVOLOG FLEXPEN) 100 UNIT/ML FlexPen Inject 10-20 Units into the skin 3 (three) times daily with meals. Sliding scale 02/04/14  Yes Kinnie Feil, MD  Liraglutide (VICTOZA) 18 MG/3ML SOPN Inject 1.8 mg into the skin daily.   Yes Historical Provider, MD  metoCLOPramide (REGLAN) 10 MG tablet Take 1 tablet (10 mg total) by mouth every 6 (six) hours as needed for nausea, vomiting or refractory nausea / vomiting. 02/17/14  Yes Kalman Drape, MD  nitroGLYCERIN (NITROSTAT) 0.4 MG SL tablet Place 0.4 mg under the tongue every 5 (five) minutes as needed for chest pain.   Yes Historical Provider, MD  oxyCODONE (OXY IR/ROXICODONE) 5 MG immediate release tablet Take 1 tablet (5 mg total) by mouth every 4 (four) hours as needed for moderate pain. 02/05/14  Yes Elaina Hoops, MD  simvastatin (ZOCOR) 40 MG tablet Take 40 mg by mouth daily. 11/15/09  Yes Historical Provider, MD  SUMAtriptan (IMITREX) 50 MG tablet Take 50 mg by mouth every 2 (two) hours as needed for migraine or headache. May repeat in 2 hours if headache persists or recurs.   Yes Historical Provider, MD  tiZANidine (ZANAFLEX) 4 MG tablet Take 1 tablet (4 mg total) by mouth 3 (three) times daily. 01/21/14  Yes Eustace Moore, MD  topiramate (TOPAMAX) 100 MG tablet Take 100 mg by mouth at bedtime. Take with 25 mg tablet for a 125 mg dose   Yes Historical Provider, MD  topiramate (TOPAMAX) 25 MG tablet Take 25 mg by mouth at bedtime. Take with 100 mg tablet for a 125 mg dose   Yes Historical Provider, MD   BP 147/86  Pulse 115  Temp(Src) 98.5 F (36.9 C)  Resp 23  Ht 5\' 5"  (1.651 m)  Wt 220 lb (99.791 kg)  BMI 36.61 kg/m2  SpO2 97%  Physical Exam  Nursing note and vitals reviewed. Constitutional: She is oriented to person, place, and time. She appears well-developed and well-nourished. No distress.  Nontoxic/nonseptic appearing  HENT:  Head: Normocephalic and atraumatic.  Eyes:  Conjunctivae and EOM are normal. No scleral icterus.  Neck: Normal range of motion.  Pulmonary/Chest: Effort normal. No respiratory distress.  Musculoskeletal: She exhibits tenderness.  Decreased range of motion of the back secondary to pain. Surgical midline scar to lumbar spine with swelling of 1cm in diameter. No active drainage. Findings suggestive of abscess. Patient exquisitely TTP to paraspinal muscles of lumbar spine.  Neurological: She is alert and oriented to person, place, and time. She exhibits normal muscle tone. Coordination normal.  Normal sensation in bilateral lower extremities. Patient moves extremities without ataxia.  Skin: Skin is warm and dry. No rash noted. She is not diaphoretic. No pallor.  No erythema or induration to PICC site. PICC site with small  amount of blood under dressing. No active bleeding.  Psychiatric: She has a normal mood and affect. Her behavior is normal.    ED Course  Procedures (including critical care time) Labs Review Labs Reviewed  CBC WITH DIFFERENTIAL - Abnormal; Notable for the following:    RBC 3.76 (*)    Hemoglobin 9.9 (*)    HCT 31.1 (*)    All other components within normal limits  BASIC METABOLIC PANEL - Abnormal; Notable for the following:    Potassium 3.0 (*)    Glucose, Bld 145 (*)    Anion gap 18 (*)    All other components within normal limits    Imaging Review No results found.   EKG Interpretation None      MDM   Final diagnoses:  Bleeding from PICC line, initial encounter    38 year old female s/p laminectomy and discectomy who developed postop infection presents to the emergency department for evaluation of her PICC site. Patient noticed bleeding from her PICC line this morning. Exam without induration or erythema to situs PICC line. No purulent drainage or evidence of acute infection to PICC line. PICC line evaluated and is functioning normally.  Patient seen in ED by her neurosurgeon, Dr. [name], who has  ordered MRI of low back as patient developed fever yesterday with findings suggestive of infection to her incision site. Dr. [name] has expressed high likelihood of taking patient to OR for additional washout today. Patient signed out to Michele Mcalpine, PA-C who will follow exam findings and disposition appropriately.    Antonietta Breach, PA-C 02/25/14 1429

## 2014-02-25 NOTE — ED Notes (Signed)
Called IV team for evaluation of PICC line

## 2014-02-25 NOTE — Op Note (Signed)
Preoperative diagnosis: Lumbar wound infection and lumbar epidural abscess  Postoperative diagnosis: Same  Procedure: Reexploration of lumbar wound debridement of devitalized tissue decompression of the thecal sac and aspiration of abscess fluid and sent cultures with irrigation with bacitracin irrigant  Surgeon: Dominica Severin Dhiya Smits  Anesthesia: Gen.  EBL: Mental  History of present illness: Patient is a 30 year from an undergone previous L5-S1 fusion and initially did very well however bounce back to the ER and septic shock was immersed in the operating room underwent washout is an on IV antibiotics at home receiving wound care and being seen by both infectious disease and neurosurgery. Patient is been on IV Rocephin. She has developed a persistent and worsening draining wound as well as persistently elevated ESR and CRP levels. Patient brought back to the ER today for a bleeding PICC line and MRI scan which showed a worsening of her lumbar epidural fluid collections the patient recommended reexploration and realigned the lumbar wound. Extensively reviewed the risks and benefits of the operation the patient as well as perioperative course expectations of outcome returns of surgery and she understood and agreed to proceed forward.  Operative procedure: Patient brought into the or was induced under general anesthesia positioned prone the Wilson frame her back was prepped and draped in routine sterile fashion her old incision was ellipticized and all devitalized tissue is removed furthermore extensive scar was dissected free as well as removal of necrotic devitalized tissue and aspirated age her medicine of pus in the superficial compartment sent that off for cultures and then start working to the fascia identified a epidural abscess collection sent those for culture well and debrided all the dead scar tissue and devitalized tissue around the epidural space dry visualized the thecal sac and visualized all the  hardware. I then copiously irrigated with bacitracin irrigation placed 2 drains one in the deep epidural space the other the superficial extrafascial space and close the wound in layers with interrupted Vicryl and a running 3-0 nylon. The wounds and dressed the patient recovered in stable condition. In the case all needle counts sponge counts were correct.

## 2014-02-25 NOTE — ED Provider Notes (Signed)
Medical screening examination/treatment/procedure(s) were conducted as a shared visit with non-physician practitioner(s) and myself.  I personally evaluated the patient during the encounter.   EKG Interpretation None     Results for orders placed during the hospital encounter of 02/25/14  CBC WITH DIFFERENTIAL      Result Value Ref Range   WBC 8.5  4.0 - 10.5 K/uL   RBC 3.76 (*) 3.87 - 5.11 MIL/uL   Hemoglobin 9.9 (*) 12.0 - 15.0 g/dL   HCT 31.1 (*) 36.0 - 46.0 %   MCV 82.7  78.0 - 100.0 fL   MCH 26.3  26.0 - 34.0 pg   MCHC 31.8  30.0 - 36.0 g/dL   RDW 15.3  11.5 - 15.5 %   Platelets 215  150 - 400 K/uL   Neutrophils Relative % 66  43 - 77 %   Neutro Abs 5.6  1.7 - 7.7 K/uL   Lymphocytes Relative 27  12 - 46 %   Lymphs Abs 2.3  0.7 - 4.0 K/uL   Monocytes Relative 5  3 - 12 %   Monocytes Absolute 0.5  0.1 - 1.0 K/uL   Eosinophils Relative 2  0 - 5 %   Eosinophils Absolute 0.2  0.0 - 0.7 K/uL   Basophils Relative 0  0 - 1 %   Basophils Absolute 0.0  0.0 - 0.1 K/uL  BASIC METABOLIC PANEL      Result Value Ref Range   Sodium 143  137 - 147 mEq/L   Potassium 3.0 (*) 3.7 - 5.3 mEq/L   Chloride 100  96 - 112 mEq/L   CO2 25  19 - 32 mEq/L   Glucose, Bld 145 (*) 70 - 99 mg/dL   BUN 11  6 - 23 mg/dL   Creatinine, Ser 0.77  0.50 - 1.10 mg/dL   Calcium 9.2  8.4 - 10.5 mg/dL   GFR calc non Af Amer >90  >90 mL/min   GFR calc Af Amer >90  >90 mL/min   Anion gap 18 (*) 5 - 15   Ct Abdomen Pelvis Wo Contrast  01/29/2014   CLINICAL DATA:  LEFT hydronephrosis, history hypertension, coronary artery disease post MI, diabetes, GERD, hyperlipidemia  EXAM: CT ABDOMEN AND PELVIS WITHOUT CONTRAST  TECHNIQUE: Multidetector CT imaging of the abdomen and pelvis was performed following the standard protocol without IV contrast. Oral contrast not administered. Sagittal and coronal MPR images reconstructed from axial data set.  COMPARISON:  01/27/2014 ultrasound abdomen  FINDINGS: BILATERAL pleural  effusions and compressive atelectasis of the lower lobes.  Within limits of a nonenhanced exam, liver, spleen, pancreas, kidneys, and adrenal glands normal appearance.  Specifically no evidence of RIGHT hydronephrosis or cystic RIGHT renal mass identified.  No ureteral dilatation.  Uterus and adnexae unremarkable.  Catheter within bladder which is decompressed.  Normal appendix.  Stomach and bowel loops grossly normal for technique.  Prior ventral pelvic surgical scar.  No definite mass, adenopathy, free fluid or inflammatory process.  Duplication of IVC.  Scattered atherosclerotic calcification.  Prior L5-S1 fusion.  IMPRESSION: No acute intra-abdominal or intrapelvic abnormalities.  BILATERAL pleural effusions and lower lobe atelectasis.   Electronically Signed   By: Lavonia Dana M.D.   On: 01/29/2014 15:12   Dg Chest 1 View  02/11/2014   CLINICAL DATA:  Operative problem.  EXAM: CHEST - 1 VIEW  COMPARISON:  02/01/2014  FINDINGS: There is a left upper extremity PICC which is stable in position, tip at the level  of distal SVC.  No cardiomegaly. Upper mediastinal contours unremarkable when accounting for distortion by leftward rotation.  There is worsening aeration of the lower lungs. In the lateral projection, this is a combination of streaky opacities (atelectasis) and posterior airspace opacity. Layering pleural effusions which are small. No pneumothorax.  IMPRESSION: Bibasilar airspace disease with small pleural effusions, primarily concerning for pneumonia. The opacities have increased from 02/01/2014.   Electronically Signed   By: Jorje Guild M.D.   On: 02/11/2014 23:54   Ct Head Wo Contrast  01/30/2014   CLINICAL DATA:  Headache and tremors  EXAM: CT HEAD WITHOUT CONTRAST  TECHNIQUE: Contiguous axial images were obtained from the base of the skull through the vertex without intravenous contrast.  COMPARISON:  CT 09/18/2013  FINDINGS: Ventricle size is normal. Negative for acute or chronic infarction.  Negative for hemorrhage or fluid collection. Negative for mass or edema. No shift of the midline structures.  Calvarium is intact.  IMPRESSION: Normal   Electronically Signed   By: Franchot Gallo M.D.   On: 01/30/2014 17:18   Mr Lumbar Spine W Wo Contrast  02/25/2014   CLINICAL DATA:  Post infection debridement. Postoperative wound drainage.  EXAM: MRI LUMBAR SPINE WITHOUT AND WITH CONTRAST  TECHNIQUE: Multiplanar and multiecho pulse sequences of the lumbar spine were obtained without and with intravenous contrast.  CONTRAST:  60mL MULTIHANCE GADOBENATE DIMEGLUMINE 529 MG/ML IV SOLN  COMPARISON:  Multiple exams, including 02/17/2014 and 02/02/2014  FINDINGS: Posterior subcutaneous fluid collection with thick enhancing rind measures 7.3 x 5.2 by 5.7 cm (previously 9.7 x 5.7 by 4.5 cm) and continues to contain internal complex material and suspected internal gas loculations. The anterior margin of this lesion is along the superficial fascia margin and there is enhancement of the adjacent paraspinal musculature.  Posterior to the spinal canal and tracking directly between the posterolateral rods at the L5 and L5-S1 level, there is a 3.2 x 1.8 by 4.1 cm fluid collection within enhancing margins (previously 4.8 x 2.6 by 3.7 Cm). No well-defined connection of this fluid signal intensity structure to the thecal sac. There is some minimal complexity within this collection as on image 7 of series 6.  The lowest lumbar type non-rib-bearing vertebra is labeled as L5. The conus medullaris appears normal. Conus level: T12.  No vertebral subluxation is observed. No significant new/ progressive vertebral edema.  Additional findings at individual levels are as follows:  L1-2: Mild right subarticular lateral recess stenosis due to a right paracentral disc protrusion.  L2-3: Borderline central narrowing of the thecal sac due to disc bulge and central disc protrusion along with facet arthropathy.  L3-4: Moderate central narrowing  of the thecal sac and mild bilateral subarticular lateral recess stenosis due to disc bulge and facet arthropathy.  L4-5: Prominent narrowing of the thecal sac noted due to facet arthropathy and disc bulge, with mild bilateral subarticular lateral recess stenosis. There is some epidural fibrosis from the postoperative level extending up to this level. Cross-sectional area of the thecal sac is 0.4 cm^2.  L5-S1: The thecal sac is prominently effaced due to the posterior fluid signal intensity lesion along the laminectomy bed. The hardware distorts the neural foramina but given the appearance of the fluid collection there may be mild to moderate bilateral foraminal stenosis at this level. The thecal sac is narrowed to 0.2 cm^2 at this level. Epidural fibrosis noted in this vicinity. No compelling epidural abscess.  IMPRESSION: 1. Mild reduction in size of both of  the postoperative fluid collections. The larger is in the subcutaneous tissues and has a thick enhancing rind, internal complexity, and internal gas density which may be from being previously drained although abscess is not completely excluded. The lesion in the laminectomy site at L5 spanning between the posterolateral rods continues to be thin-walled but exerts mass effect on the thecal sac and neural foramina from posterior, and is mildly complex. Given the thin walled appearance this is probably a sterile postoperative fluid collection although again, abscess is difficult to totally exclude. 2. There is also prominent narrowing of the thecal sac at the L4-5 level due to facet arthropathy and disc bulge. 3. Moderate impingement at L3-4 and mild impingement at L1-2 due to spondylosis and degenerative disc disease.   Electronically Signed   By: Sherryl Barters M.D.   On: 02/25/2014 15:33   Mr Lumbar Spine W Wo Contrast  02/03/2014   CLINICAL DATA:  Epidural abscess.  EXAM: MRI LUMBAR SPINE WITHOUT AND WITH CONTRAST  TECHNIQUE: Multiplanar and multiecho  pulse sequences of the lumbar spine were obtained without and with intravenous contrast.  CONTRAST:  18mL MULTIHANCE GADOBENATE DIMEGLUMINE 529 MG/ML IV SOLN  COMPARISON:  CT of the abdomen and pelvis January 29, 2014 and MRI of the lumbar spine December 05, 2013  FINDINGS: Large body habitus results in overall decreased signal to noise ratio. Patient is status post L5-S1 posterior instrumentation with interbody disc material, suspect L4-5 undersurface laminotomy. On prior CT there was a paraspinal muscles surgical drain which is has likely been removed. Low T1, bright T2 rim enhancing 10.3 x 4.5 x 5.3 cm (cc x transverse x AP) fluid collection which is contiguous with the paraspinal muscles and surgical bed/epidural space. Small amount of low signal air within the fluid collection.  L5-S1 PLIF with resultant susceptibility artifact. Vertebral bodies appear intact and aligned with maintenance of lumbar lordosis. Non surgically altered discs morphology generally preserved with decreased T2 signal within all lumbar disc most consistent mild desiccation, superimposed bright STIR signal within the L1-2 disc may reflect edema associated with moderate acute on chronic enhancing discogenic endplate changes. No suspicious osseous nor intradiscal enhancement.  Conus medullaris terminates at T12-L1 and appears normal morphology and signal characteristics. Central displacement of the cauda equina as described below with no convincing evidence of suspicious leptomeningeal enhancement.  Level by level evaluation:  L1-2: Small right paracentral disc protrusion, endplate spurring, moderate facet arthropathy. Very mild canal stenosis. Mild right neural foraminal narrowing.  L2-3: Small left far lateral disc protrusion, moderate facet arthropathy and ligamentum flavum redundancy without canal stenosis. No neural foraminal narrowing.  L3-4: Mild annular bulging, moderate facet arthropathy and ligamentum flavum redundancy. Mild canal  stenosis and mild bilateral neural foraminal narrowing.  L4-5: Mild annular bulging, please moderate facet arthropathy with trace facet effusions. Moderate canal stenosis without neural foraminal narrowing.  L5-S1: Susceptibility artifact from disc material, in addition, paraspinal fluid collection with suspected severe canal stenosis. Suspect at least moderate left greater than right neural foraminal narrowing, which was present on prior examination.  IMPRESSION: 10.3 x 4.5 x 5.4 cm rim enhancing fluid collection within this paraspinal soft tissues at the level of surgical intervention, status post L5-S1 PLIF. Fluid collection extends to the surgical bed, apparent interval removal of the surgical drain. Considering recent surgical history, this could reflect seroma and contracting hematoma though, abscess may have a similar appearance. Unlikely to reflect a myelomeningocele considering enhancement. Fluid collection and postsurgical changes resultant suspected severe canal stenosis  at L5-S1.  No MR findings of discitis osteomyelitis. No acute lumbar spine fractures nor malalignment.  Lumbar spondylosis as described above.   Electronically Signed   By: Elon Alas   On: 02/03/2014 02:08   US Renal  01/27/2014   CLINICAL DATA:  Rising BUN and creatinine question hydronephrosis  EXAM: RENAL/URINARY TRACT ULTRASOUND COMPLETE  COMPARISON:  None  FINDINGS: Right Kidney:  Length: 12.3 cm. Normal morphology without mass or hydronephrosis. No shadowing calcifications.  Left Kidney:  Suboptimally visualized question due to body habitus. Unable to accurately measure renal length. An large area of hypo echogenicity is identified in the region of the LEFT kidney which could represent a dilated renal collecting system or a large cyst but is suboptimally localized and delineated.  Bladder:  Decompressed, unable to evaluate.  IMPRESSION: Normal appearing RIGHT kidney.  Unable to assess bladder due to decompressed state.   Inadequate visualization of LEFT kidney to assess length and morphology; an area of hypo echogenicity is identified in the region of LEFT kidney which could potentially represent a dilated renal collecting system or a large cyst but appearance is nondiagnostic.  Recommend followup non contrast CT to exclude LEFT hydronephrosis.   Electronically Signed   By: Lavonia Dana M.D.   On: 01/27/2014 13:54   Dg Chest Port 1 View  01/30/2014   CLINICAL DATA:  Pulmonary edema  EXAM: PORTABLE CHEST - 1 VIEW  COMPARISON:  January 29, 2014  FINDINGS: There is patchy airspace disease in the bases, stable. There is no appreciable interstitial edema or effusion currently. Heart remains mildly prominent with normal pulmonary vascularity. No adenopathy.  IMPRESSION: There is stable patchy bibasilar airspace disease. Currently there is no edema or effusion. No new opacity.   Electronically Signed   By: Lowella Grip M.D.   On: 01/30/2014 07:38   Dg Chest Port 1 View  01/29/2014   CLINICAL DATA:  Pulmonary edema  EXAM: PORTABLE CHEST - 1 VIEW  COMPARISON:  01/28/2014  FINDINGS: Bilateral airspace disease shows mild interval improvement. Improvement in left lower lobe atelectasis. Small left effusion is present.  IMPRESSION: Mild improvement in pulmonary edema. Improvement in left lower lobe atelectasis. Small left effusion is present.   Electronically Signed   By: Franchot Gallo M.D.   On: 01/29/2014 07:59   Dg Chest Port 1 View  01/28/2014   CLINICAL DATA:  Pulmonary edema.  EXAM: PORTABLE CHEST - 1 VIEW  COMPARISON:  January 27, 2014.  FINDINGS: Cardiomediastinal silhouette appears normal. Bilateral perihilar and basilar opacities are noted which appears slightly worse on the left. These findings are most consistent with pulmonary edema. No pneumothorax is noted. Probable minimal to mild left pleural effusion. Bony thorax is intact.  IMPRESSION: Continued presence of bilateral pulmonary edema which appears to be worse on the  left with probable associated minimal to mild left pleural effusion.   Electronically Signed   By: Sabino Dick M.D.   On: 01/28/2014 08:15   Dg Chest Port 1 View  01/27/2014   CLINICAL DATA:  Pulmonary edema  EXAM: PORTABLE CHEST - 1 VIEW  COMPARISON:  01/26/2014  FINDINGS: Heart, mediastinum and hila are unremarkable. Central vascular congestion and bilateral, centrally predominant, ill-defined interstitial and hazy airspace opacities are stable consistent with pulmonary edema. Small effusions are suspected. No pneumothorax.  IMPRESSION: Persistent pulmonary edema without change from the prior study. No new abnormalities.   Electronically Signed   By: Lajean Manes M.D.   On: 01/27/2014 08:20  Dg Abd Acute W/chest  02/01/2014   CLINICAL DATA:  Abd pain  EXAM: ACUTE ABDOMEN SERIES (ABDOMEN 2 VIEW & CHEST 1 VIEW)  COMPARISON:  Portal chest radiograph 01/30/2014  FINDINGS: There is no evidence of dilated bowel loops or free intraperitoneal air. No radiopaque calculi or other significant radiographic abnormality is seen. The heart is enlarged. Left central venous catheter via subclavian approach tip superior vena cava. Persistent density left lung base. No acute osseous abnormalities. Decreased density right lung base.  IMPRESSION: Persistent airspace disease in left lung base. Improved disease right lung base. Nonobstructive bowel gas pattern. No evidence of free air.   Electronically Signed   By: Margaree Mackintosh M.D.   On: 02/01/2014 14:42    The patient is followed by Dr. Weston Settle neurosurgery. The patient had back surgery get an infection and had to have it washed out. Patient was going to have an outpatient MRI today. Patient came in because thought that there was a problem with the PICC line. It was bleeding. Patient getting antibiotics the PICC line. Here today the PICC line looks fine. MRI results need to be interpreted by by Dr. Weston Settle. Dr. Weston Settle was thinking the patient was brought you have to go back to  the operating room again today for another washout. Disposition pending Dr. Weston Settle. Consultation requested Dr. Weston Settle has been made. The PICC line appears to be fine does not require any specific interventions at this time.  Fredia Sorrow, MD 02/25/14 [phone]

## 2014-02-25 NOTE — Progress Notes (Signed)
Review this is an MRI scan which does show 2 ring-enhancing lesions one deep tissue and the epidural space and the other subcutaneous bolt seemed to be fairly large the epidural fluid collection is compressive causes her thecal sac stenosis and due to her persistent draining wound or persistent failure of interbody treatment and the worsening of her MRI scan I recommended a reexploration washout of her lumbar wound and has reviewed the risks and benefits of the operation with the patient and her family and they understand and agree to proceed forward.

## 2014-02-25 NOTE — Anesthesia Preprocedure Evaluation (Addendum)
Anesthesia Evaluation  Patient identified by MRN, date of birth, ID band Patient awake    Reviewed: Allergy & Precautions, H&P , NPO status , Patient's Chart, lab work & pertinent test results, reviewed documented beta blocker date and time   Airway Mallampati: II TM Distance: >3 FB Neck ROM: Full    Dental no notable dental hx. (+) Teeth Intact, Dental Advisory Given   Pulmonary neg pulmonary ROS,  breath sounds clear to auscultation  Pulmonary exam normal       Cardiovascular hypertension, On Medications and On Home Beta Blockers + CAD and + Past MI + dysrhythmias Rhythm:Regular Rate:Normal     Neuro/Psych  Headaches, negative psych ROS   GI/Hepatic Neg liver ROS, GERD-  Medicated and Controlled,  Endo/Other  diabetes, Type 1, Insulin DependentMorbid obesity  Renal/GU negative Renal ROS  negative genitourinary   Musculoskeletal   Abdominal   Peds  Hematology negative hematology ROS (+)   Anesthesia Other Findings   Reproductive/Obstetrics negative OB ROS                          Anesthesia Physical Anesthesia Plan  ASA: III  Anesthesia Plan: General   Post-op Pain Management:    Induction: Intravenous  Airway Management Planned: Oral ETT  Additional Equipment:   Intra-op Plan:   Post-operative Plan: Extubation in OR  Informed Consent: I have reviewed the patients History and Physical, chart, labs and discussed the procedure including the risks, benefits and alternatives for the proposed anesthesia with the patient or authorized representative who has indicated his/her understanding and acceptance.   Dental advisory given  Plan Discussed with: CRNA  Anesthesia Plan Comments:         Anesthesia Quick Evaluation

## 2014-02-25 NOTE — ED Notes (Signed)
Called IV start team

## 2014-02-25 NOTE — Anesthesia Postprocedure Evaluation (Signed)
  Anesthesia Post-op Note  Patient: Marissa [name]  Procedure(s) Performed: Procedure(s): LUMBAR WOUND DEBRIDEMENT (N/A)  Patient Location: PACU  Anesthesia Type:General  Level of Consciousness: awake and alert   Airway and Oxygen Therapy: Patient Spontanous Breathing  Post-op Pain: moderate  Post-op Assessment: Post-op Vital signs reviewed, Patient's Cardiovascular Status Stable and Respiratory Function Stable  Post-op Vital Signs: Reviewed  Filed Vitals:   02/25/14 2240  BP: 155/90  Pulse: 106  Temp: 36.8 C  Resp: 20    Complications: No apparent anesthesia complications

## 2014-02-25 NOTE — ED Notes (Signed)
Obtained consent for I and D. Dr. [name] at bedside speaking to pt about procedure.

## 2014-02-25 NOTE — Progress Notes (Addendum)
ANTIBIOTIC CONSULT NOTE - INITIAL  Pharmacy Consult for vancomycin  Indication:lumbar wound infection and abscess drainage  Allergies  Allergen Reactions  . Ciprofloxacin Swelling  . Azithromycin Itching and Rash  . Food Itching and Rash    "Greek yogurt"    Patient Measurements: Height: 5\' 5"  (165.1 cm) Weight: 220 lb (99.791 kg) IBW/kg (Calculated) : 57 Adjusted Body Weight:   Vital Signs: Temp: 98.2 F (36.8 C) (07/15 2240) BP: 155/90 mmHg (07/15 2240) Pulse Rate: 106 (07/15 2240) Intake/Output from previous day:   Intake/Output from this shift: Total I/O In: 1400 [I.V.:1400] Out: 20 [Drains:20]  Labs:  Recent Labs  02/25/14 1151  WBC 8.5  HGB 9.9*  PLT 215  CREATININE 0.77   Estimated Creatinine Clearance: 111.5 ml/min (by C-G formula based on Cr of 0.77). No results found for this basename: VANCOTROUGH, Corlis Leak, VANCORANDOM, [place], GENTPEAK, GENTRANDOM, [place], TOBRAPEAK, TOBRARND, AMIKACINPEAK, AMIKACINTROU, AMIKACIN,  in the last 72 hours   Microbiology: Recent Results (from the past 720 hour(s))  CLOSTRIDIUM DIFFICILE BY PCR     Status: None   Collection Time    01/28/14  9:35 PM      Result Value Ref Range Status   C difficile by pcr NEGATIVE  NEGATIVE Final  URINE CULTURE     Status: None   Collection Time    02/17/14  1:57 AM      Result Value Ref Range Status   Specimen Description URINE, RANDOM   Final   Special Requests NONE   Final   Culture  Setup Time     Final   Value: 02/17/2014 02:14     Performed at [facility]     Final   Value: >=100,000 COLONIES/ML     Performed at [facility]   Culture     Final   Value: Multiple bacterial morphotypes present, none predominant. Suggest appropriate recollection if clinically indicated.     Performed at [facility]   Report Status 02/18/2014 FINAL   Final  GRAM STAIN     Status: None   Collection Time    02/25/14  8:06 PM      Result  Value Ref Range Status   Specimen Description WOUND BACK   Final   Special Requests SUPERFICIAL LUMBAR SUPERFICIAL LUMBAR   Final   Gram Stain     Final   Value: MODERATE WBC PRESENT,BOTH PMN AND MONONUCLEAR     NO ORGANISMS SEEN   Report Status 02/25/2014 FINAL   Final  GRAM STAIN     Status: None   Collection Time    02/25/14  8:07 PM      Result Value Ref Range Status   Specimen Description WOUND BACK   Final   Special Requests SUPERFICIAL LUMBAR SUPERFICIAL LUMBAR   Final   Gram Stain     Final   Value: ABUNDANT WBC PRESENT,BOTH PMN AND MONONUCLEAR     NO ORGANISMS SEEN   Report Status 02/25/2014 FINAL   Final  GRAM STAIN     Status: None   Collection Time    02/25/14  8:08 PM      Result Value Ref Range Status   Specimen Description WOUND   Final   Special Requests NO3 DEEP SPACE LUMBAR FLUID   Final   Gram Stain     Final   Value: RARE WBC PRESENT,BOTH PMN AND MONONUCLEAR     NO ORGANISMS SEEN   Report Status 02/25/2014  FINAL   Final  GRAM STAIN     Status: None   Collection Time    02/25/14  8:29 PM      Result Value Ref Range Status   Specimen Description WOUND   Final   Special Requests LUMBAR WOUND EPIDURAL FLUID RECEIVED SWAB   Final   Gram Stain     Final   Value: ABUNDANT WBC PRESENT,BOTH PMN AND MONONUCLEAR     NO ORGANISMS SEEN   Report Status 02/25/2014 FINAL   Final    Medical History: Past Medical History  Diagnosis Date  . Hypertension   . Coronary artery disease   . Neuropathy   . Headache(784.0)   . Arthritis   . Vision loss     due to diabetes  . Myocardial infarction 2006    "due to medication"; no evidence of ischemia or infarction by nuclear stress test '11  . Dysrhythmia     tachycardia  . Diabetes mellitus without complication     fasting cbg 50-140s  . GERD (gastroesophageal reflux disease)     otc meds  . Migraines     once/month maybe - can last up to two weeks  . Hyperlipemia     Medications:  Prescriptions prior to  admission  Medication Sig Dispense Refill  . amitriptyline (ELAVIL) 100 MG tablet Take 100 mg by mouth at bedtime.       . carvedilol (COREG) 25 MG tablet Take 1 tablet (25 mg total) by mouth 2 (two) times daily with a meal.  60 tablet  2  . cefTRIAXone 2 g in dextrose 5 % 50 mL Inject 2 g into the vein daily.  1 Units  0  . gabapentin (NEURONTIN) 300 MG capsule Take 600 mg by mouth 3 (three) times daily.       . insulin aspart (NOVOLOG FLEXPEN) 100 UNIT/ML FlexPen Inject 10-20 Units into the skin 3 (three) times daily with meals. Sliding scale  15 mL  11  . Liraglutide (VICTOZA) 18 MG/3ML SOPN Inject 1.8 mg into the skin daily.      . metoCLOPramide (REGLAN) 10 MG tablet Take 1 tablet (10 mg total) by mouth every 6 (six) hours as needed for nausea, vomiting or refractory nausea / vomiting.  30 tablet  0  . nitroGLYCERIN (NITROSTAT) 0.4 MG SL tablet Place 0.4 mg under the tongue every 5 (five) minutes as needed for chest pain.      Marland Kitchen oxyCODONE (OXY IR/ROXICODONE) 5 MG immediate release tablet Take 1 tablet (5 mg total) by mouth every 4 (four) hours as needed for moderate pain.  80 tablet  0  . simvastatin (ZOCOR) 40 MG tablet Take 40 mg by mouth daily.      . SUMAtriptan (IMITREX) 50 MG tablet Take 50 mg by mouth every 2 (two) hours as needed for migraine or headache. May repeat in 2 hours if headache persists or recurs.      Marland Kitchen tiZANidine (ZANAFLEX) 4 MG tablet Take 1 tablet (4 mg total) by mouth 3 (three) times daily.  60 tablet  1  . topiramate (TOPAMAX) 100 MG tablet Take 100 mg by mouth at bedtime. Take with 25 mg tablet for a 125 mg dose      . topiramate (TOPAMAX) 25 MG tablet Take 25 mg by mouth at bedtime. Take with 100 mg tablet for a 125 mg dose       Assessment: Reexploration of lumbar wound and debridement. S/p lumba L5-S1 fusion. On 2gm  rocephin from home health care. Add vancomycin. Previous critical high vanc trough was most likely an acute post-op renal insult. srcr has returned to  baseline. Will monitor closely.  Goal of Therapy:  vanc 15-20 mcg/ml   Plan:  Vancomycin 1 gm q8h next dose 0400 trough before 4th dose.    Curlene Dolphin 02/25/2014,11:24 PM

## 2014-02-25 NOTE — Consult Note (Signed)
Reason for Consult: Back pain Referring Physician: Emergency department  SWAY GUTTIERREZ is an 38 y.o. female.  HPI: Patient is a 38 year female well-known to me who underwent previous lumbar fusion subsequent infection with subsequent washout she was admitted to the ICU with septic shock and developed ATN with acute renal failure and was stabilized and transferred to floor and ultimately was stable for discharge and discharged on IV antibiotics. She's been a home with some persistent intermittent drainage from her lumbar incision receiving IV antibiotics through a PICC line. She's had persistently elevated CRP and ESR levels and developed some bleeding around her PICC line site today so inserted to come to the ER and evaluated for both PICC line as well as emergent MRI scan looked or back and see if she needs another washout.  Past Medical History  Diagnosis Date  . Hypertension   . Coronary artery disease   . Neuropathy   . Headache(784.0)   . Arthritis   . Vision loss     due to diabetes  . Myocardial infarction 2006    "due to medication"; no evidence of ischemia or infarction by nuclear stress test '11  . Dysrhythmia     tachycardia  . Diabetes mellitus without complication     fasting cbg 50-140s  . GERD (gastroesophageal reflux disease)     otc meds  . Migraines     once/month maybe - can last up to two weeks  . Hyperlipemia     Past Surgical History  Procedure Laterality Date  . Back surgery  2011    Lumbar   . Cervical fusion  2006  . Salpingoophorectomy Bilateral     1 1997. 2nd 2001  . Cardiac catheterization  07/18/2004    50-60% mid LAD, minor luminal irregularities RCA, normal LM and CX, EF 50-55% [facility])  . Lumbar laminectomy/decompression microdiscectomy Right 05/07/2013    Procedure: Right Lumbar one-two laminectomy;  Surgeon: Elaina Hoops, MD;  Location: [place] [facility];  Service: Neurosurgery;  Laterality: Right;  . Carpel tunnel Bilateral   .  Lumbar laminectomy/decompression microdiscectomy Left 10/29/2013    Procedure: Left Lumbar five-Sacral one Laminectomy;  Surgeon: Elaina Hoops, MD;  Location: [place] [facility];  Service: Neurosurgery;  Laterality: Left;  . Lumbar wound debridement N/A 01/25/2014    Procedure: LUMBAR WOUND DEBRIDEMENT;  Surgeon: Charlie Pitter, MD;  Location: [place] [facility];  Service: Neurosurgery;  Laterality: N/A;    History reviewed. No pertinent family history.  Social History:  reports that she has never smoked. She does not have any smokeless tobacco history on file. She reports that she does not drink alcohol or use illicit drugs.  Allergies:  Allergies  Allergen Reactions  . Ciprofloxacin Swelling  . Azithromycin Itching and Rash  . Food Itching and Rash    "[place] yogurt"    Medications: I have reviewed the patient's current medications.  No results found for this or any previous visit (from the past 48 hour(s)).  No results found.  Review of Systems  Cardiovascular: Positive for leg swelling.  Musculoskeletal: Positive for back pain.  Neurological: Positive for tingling, sensory change and weakness.  Psychiatric/Behavioral: Negative.    Blood pressure 190/113, pulse 113, temperature 98.5 F (36.9 C), resp. rate 15, height '5\' 5"'  (1.651 m), weight 99.791 kg (220 lb), SpO2 100.00%. Physical Exam  Constitutional: She is oriented to person, place, and time. She appears well-developed and well-nourished.  HENT:  Head: Normocephalic.  Eyes: Pupils are equal, round, and reactive to light.  Neck: Normal range of motion.  Cardiovascular: Normal rate.   Respiratory: Effort normal.  GI: Soft.  Neurological: She is alert and oriented to person, place, and time. GCS eye subscore is 4. GCS verbal subscore is 5. GCS motor subscore is 6.  Right lower extremity strength is 5 out of 5 iliopsoas quads and she's gastrocs and EHL left lower extremity strength is 5 out of 5 except for some trace weakness in  dorsiflexion 4+ out of 5 in his is baseline for her    Assessment/Plan: 38 year female with persistent wound infection with unclear response to IV treatment. Will get an emergent MRI scan and we'll have the emergency department evaluate her PICC line and consider reexploration and re\re ID of lumbar wound.  Layla Gramm P 02/25/2014, 11:41 AM

## 2014-02-25 NOTE — ED Notes (Signed)
IV team at bedside and redressed IV.

## 2014-02-25 NOTE — ED Provider Notes (Signed)
Patient seen by Antonietta Breach, PA-C and Michele Mcalpine PA-C prior to being handed off to myself.  Deborah Chalk has spoken with Dr. [name] who asked for repeat MRI.  The MRI has been done which shows;  Results for orders placed during the hospital encounter of 02/25/14  CBC WITH DIFFERENTIAL      Result Value Ref Range   WBC 8.5  4.0 - 10.5 K/uL   RBC 3.76 (*) 3.87 - 5.11 MIL/uL   Hemoglobin 9.9 (*) 12.0 - 15.0 g/dL   HCT 31.1 (*) 36.0 - 46.0 %   MCV 82.7  78.0 - 100.0 fL   MCH 26.3  26.0 - 34.0 pg   MCHC 31.8  30.0 - 36.0 g/dL   RDW 15.3  11.5 - 15.5 %   Platelets 215  150 - 400 K/uL   Neutrophils Relative % 66  43 - 77 %   Neutro Abs 5.6  1.7 - 7.7 K/uL   Lymphocytes Relative 27  12 - 46 %   Lymphs Abs 2.3  0.7 - 4.0 K/uL   Monocytes Relative 5  3 - 12 %   Monocytes Absolute 0.5  0.1 - 1.0 K/uL   Eosinophils Relative 2  0 - 5 %   Eosinophils Absolute 0.2  0.0 - 0.7 K/uL   Basophils Relative 0  0 - 1 %   Basophils Absolute 0.0  0.0 - 0.1 K/uL  BASIC METABOLIC PANEL      Result Value Ref Range   Sodium 143  137 - 147 mEq/L   Potassium 3.0 (*) 3.7 - 5.3 mEq/L   Chloride 100  96 - 112 mEq/L   CO2 25  19 - 32 mEq/L   Glucose, Bld 145 (*) 70 - 99 mg/dL   BUN 11  6 - 23 mg/dL   Creatinine, Ser 0.77  0.50 - 1.10 mg/dL   Calcium 9.2  8.4 - 10.5 mg/dL   GFR calc non Af Amer >90  >90 mL/min   GFR calc Af Amer >90  >90 mL/min   Anion gap 18 (*) 5 - 15   Ct Abdomen Pelvis Wo Contrast  01/29/2014   CLINICAL DATA:  LEFT hydronephrosis, history hypertension, coronary artery disease post MI, diabetes, GERD, hyperlipidemia  EXAM: CT ABDOMEN AND PELVIS WITHOUT CONTRAST  TECHNIQUE: Multidetector CT imaging of the abdomen and pelvis was performed following the standard protocol without IV contrast. Oral contrast not administered. Sagittal and coronal MPR images reconstructed from axial data set.  COMPARISON:  01/27/2014 ultrasound abdomen  FINDINGS: BILATERAL pleural effusions and compressive atelectasis  of the lower lobes.  Within limits of a nonenhanced exam, liver, spleen, pancreas, kidneys, and adrenal glands normal appearance.  Specifically no evidence of RIGHT hydronephrosis or cystic RIGHT renal mass identified.  No ureteral dilatation.  Uterus and adnexae unremarkable.  Catheter within bladder which is decompressed.  Normal appendix.  Stomach and bowel loops grossly normal for technique.  Prior ventral pelvic surgical scar.  No definite mass, adenopathy, free fluid or inflammatory process.  Duplication of IVC.  Scattered atherosclerotic calcification.  Prior L5-S1 fusion.  IMPRESSION: No acute intra-abdominal or intrapelvic abnormalities.  BILATERAL pleural effusions and lower lobe atelectasis.   Electronically Signed   By: Lavonia Dana M.D.   On: 01/29/2014 15:12   Dg Chest 1 View  02/11/2014   CLINICAL DATA:  Operative problem.  EXAM: CHEST - 1 VIEW  COMPARISON:  02/01/2014  FINDINGS: There is a left upper extremity PICC which is stable  in position, tip at the level of distal SVC.  No cardiomegaly. Upper mediastinal contours unremarkable when accounting for distortion by leftward rotation.  There is worsening aeration of the lower lungs. In the lateral projection, this is a combination of streaky opacities (atelectasis) and posterior airspace opacity. Layering pleural effusions which are small. No pneumothorax.  IMPRESSION: Bibasilar airspace disease with small pleural effusions, primarily concerning for pneumonia. The opacities have increased from 02/01/2014.   Electronically Signed   By: Jorje Guild M.D.   On: 02/11/2014 23:54   Ct Head Wo Contrast  01/30/2014   CLINICAL DATA:  Headache and tremors  EXAM: CT HEAD WITHOUT CONTRAST  TECHNIQUE: Contiguous axial images were obtained from the base of the skull through the vertex without intravenous contrast.  COMPARISON:  CT 09/18/2013  FINDINGS: Ventricle size is normal. Negative for acute or chronic infarction. Negative for hemorrhage or fluid  collection. Negative for mass or edema. No shift of the midline structures.  Calvarium is intact.  IMPRESSION: Normal   Electronically Signed   By: Franchot Gallo M.D.   On: 01/30/2014 17:18   Mr Lumbar Spine W Wo Contrast  02/25/2014   CLINICAL DATA:  Post infection debridement. Postoperative wound drainage.  EXAM: MRI LUMBAR SPINE WITHOUT AND WITH CONTRAST  TECHNIQUE: Multiplanar and multiecho pulse sequences of the lumbar spine were obtained without and with intravenous contrast.  CONTRAST:  32mL MULTIHANCE GADOBENATE DIMEGLUMINE 529 MG/ML IV SOLN  COMPARISON:  Multiple exams, including 02/17/2014 and 02/02/2014  FINDINGS: Posterior subcutaneous fluid collection with thick enhancing rind measures 7.3 x 5.2 by 5.7 cm (previously 9.7 x 5.7 by 4.5 cm) and continues to contain internal complex material and suspected internal gas loculations. The anterior margin of this lesion is along the superficial fascia margin and there is enhancement of the adjacent paraspinal musculature.  Posterior to the spinal canal and tracking directly between the posterolateral rods at the L5 and L5-S1 level, there is a 3.2 x 1.8 by 4.1 cm fluid collection within enhancing margins (previously 4.8 x 2.6 by 3.7 Cm). No well-defined connection of this fluid signal intensity structure to the thecal sac. There is some minimal complexity within this collection as on image 7 of series 6.  The lowest lumbar type non-rib-bearing vertebra is labeled as L5. The conus medullaris appears normal. Conus level: T12.  No vertebral subluxation is observed. No significant new/ progressive vertebral edema.  Additional findings at individual levels are as follows:  L1-2: Mild right subarticular lateral recess stenosis due to a right paracentral disc protrusion.  L2-3: Borderline central narrowing of the thecal sac due to disc bulge and central disc protrusion along with facet arthropathy.  L3-4: Moderate central narrowing of the thecal sac and mild  bilateral subarticular lateral recess stenosis due to disc bulge and facet arthropathy.  L4-5: Prominent narrowing of the thecal sac noted due to facet arthropathy and disc bulge, with mild bilateral subarticular lateral recess stenosis. There is some epidural fibrosis from the postoperative level extending up to this level. Cross-sectional area of the thecal sac is 0.4 cm^2.  L5-S1: The thecal sac is prominently effaced due to the posterior fluid signal intensity lesion along the laminectomy bed. The hardware distorts the neural foramina but given the appearance of the fluid collection there may be mild to moderate bilateral foraminal stenosis at this level. The thecal sac is narrowed to 0.2 cm^2 at this level. Epidural fibrosis noted in this vicinity. No compelling epidural abscess.  IMPRESSION: 1. Mild  reduction in size of both of the postoperative fluid collections. The larger is in the subcutaneous tissues and has a thick enhancing rind, internal complexity, and internal gas density which may be from being previously drained although abscess is not completely excluded. The lesion in the laminectomy site at L5 spanning between the posterolateral rods continues to be thin-walled but exerts mass effect on the thecal sac and neural foramina from posterior, and is mildly complex. Given the thin walled appearance this is probably a sterile postoperative fluid collection although again, abscess is difficult to totally exclude. 2. There is also prominent narrowing of the thecal sac at the L4-5 level due to facet arthropathy and disc bulge. 3. Moderate impingement at L3-4 and mild impingement at L1-2 due to spondylosis and degenerative disc disease.   Electronically Signed   By: Sherryl Barters M.D.   On: 02/25/2014 15:33   Mr Lumbar Spine W Wo Contrast  02/03/2014   CLINICAL DATA:  Epidural abscess.  EXAM: MRI LUMBAR SPINE WITHOUT AND WITH CONTRAST  TECHNIQUE: Multiplanar and multiecho pulse sequences of the lumbar  spine were obtained without and with intravenous contrast.  CONTRAST:  21mL MULTIHANCE GADOBENATE DIMEGLUMINE 529 MG/ML IV SOLN  COMPARISON:  CT of the abdomen and pelvis January 29, 2014 and MRI of the lumbar spine December 05, 2013  FINDINGS: Large body habitus results in overall decreased signal to noise ratio. Patient is status post L5-S1 posterior instrumentation with interbody disc material, suspect L4-5 undersurface laminotomy. On prior CT there was a paraspinal muscles surgical drain which is has likely been removed. Low T1, bright T2 rim enhancing 10.3 x 4.5 x 5.3 cm (cc x transverse x AP) fluid collection which is contiguous with the paraspinal muscles and surgical bed/epidural space. Small amount of low signal air within the fluid collection.  L5-S1 PLIF with resultant susceptibility artifact. Vertebral bodies appear intact and aligned with maintenance of lumbar lordosis. Non surgically altered discs morphology generally preserved with decreased T2 signal within all lumbar disc most consistent mild desiccation, superimposed bright STIR signal within the L1-2 disc may reflect edema associated with moderate acute on chronic enhancing discogenic endplate changes. No suspicious osseous nor intradiscal enhancement.  Conus medullaris terminates at T12-L1 and appears normal morphology and signal characteristics. Central displacement of the cauda equina as described below with no convincing evidence of suspicious leptomeningeal enhancement.  Level by level evaluation:  L1-2: Small right paracentral disc protrusion, endplate spurring, moderate facet arthropathy. Very mild canal stenosis. Mild right neural foraminal narrowing.  L2-3: Small left far lateral disc protrusion, moderate facet arthropathy and ligamentum flavum redundancy without canal stenosis. No neural foraminal narrowing.  L3-4: Mild annular bulging, moderate facet arthropathy and ligamentum flavum redundancy. Mild canal stenosis and mild bilateral neural  foraminal narrowing.  L4-5: Mild annular bulging, please moderate facet arthropathy with trace facet effusions. Moderate canal stenosis without neural foraminal narrowing.  L5-S1: Susceptibility artifact from disc material, in addition, paraspinal fluid collection with suspected severe canal stenosis. Suspect at least moderate left greater than right neural foraminal narrowing, which was present on prior examination.  IMPRESSION: 10.3 x 4.5 x 5.4 cm rim enhancing fluid collection within this paraspinal soft tissues at the level of surgical intervention, status post L5-S1 PLIF. Fluid collection extends to the surgical bed, apparent interval removal of the surgical drain. Considering recent surgical history, this could reflect seroma and contracting hematoma though, abscess may have a similar appearance. Unlikely to reflect a myelomeningocele considering enhancement. Fluid collection and postsurgical  changes resultant suspected severe canal stenosis at L5-S1.  No MR findings of discitis osteomyelitis. No acute lumbar spine fractures nor malalignment.  Lumbar spondylosis as described above.   Electronically Signed   By: Elon Alas   On: 02/03/2014 02:08   US Renal  01/27/2014   CLINICAL DATA:  Rising BUN and creatinine question hydronephrosis  EXAM: RENAL/URINARY TRACT ULTRASOUND COMPLETE  COMPARISON:  None  FINDINGS: Right Kidney:  Length: 12.3 cm. Normal morphology without mass or hydronephrosis. No shadowing calcifications.  Left Kidney:  Suboptimally visualized question due to body habitus. Unable to accurately measure renal length. An large area of hypo echogenicity is identified in the region of the LEFT kidney which could represent a dilated renal collecting system or a large cyst but is suboptimally localized and delineated.  Bladder:  Decompressed, unable to evaluate.  IMPRESSION: Normal appearing RIGHT kidney.  Unable to assess bladder due to decompressed state.  Inadequate visualization of LEFT  kidney to assess length and morphology; an area of hypo echogenicity is identified in the region of LEFT kidney which could potentially represent a dilated renal collecting system or a large cyst but appearance is nondiagnostic.  Recommend followup non contrast CT to exclude LEFT hydronephrosis.   Electronically Signed   By: Lavonia Dana M.D.   On: 01/27/2014 13:54   Dg Chest Port 1 View  01/30/2014   CLINICAL DATA:  Pulmonary edema  EXAM: PORTABLE CHEST - 1 VIEW  COMPARISON:  January 29, 2014  FINDINGS: There is patchy airspace disease in the bases, stable. There is no appreciable interstitial edema or effusion currently. Heart remains mildly prominent with normal pulmonary vascularity. No adenopathy.  IMPRESSION: There is stable patchy bibasilar airspace disease. Currently there is no edema or effusion. No new opacity.   Electronically Signed   By: Lowella Grip M.D.   On: 01/30/2014 07:38   Dg Chest Port 1 View  01/29/2014   CLINICAL DATA:  Pulmonary edema  EXAM: PORTABLE CHEST - 1 VIEW  COMPARISON:  01/28/2014  FINDINGS: Bilateral airspace disease shows mild interval improvement. Improvement in left lower lobe atelectasis. Small left effusion is present.  IMPRESSION: Mild improvement in pulmonary edema. Improvement in left lower lobe atelectasis. Small left effusion is present.   Electronically Signed   By: Franchot Gallo M.D.   On: 01/29/2014 07:59   Dg Chest Port 1 View  01/28/2014   CLINICAL DATA:  Pulmonary edema.  EXAM: PORTABLE CHEST - 1 VIEW  COMPARISON:  January 27, 2014.  FINDINGS: Cardiomediastinal silhouette appears normal. Bilateral perihilar and basilar opacities are noted which appears slightly worse on the left. These findings are most consistent with pulmonary edema. No pneumothorax is noted. Probable minimal to mild left pleural effusion. Bony thorax is intact.  IMPRESSION: Continued presence of bilateral pulmonary edema which appears to be worse on the left with probable associated  minimal to mild left pleural effusion.   Electronically Signed   By: Sabino Dick M.D.   On: 01/28/2014 08:15   Dg Chest Port 1 View  01/27/2014   CLINICAL DATA:  Pulmonary edema  EXAM: PORTABLE CHEST - 1 VIEW  COMPARISON:  01/26/2014  FINDINGS: Heart, mediastinum and hila are unremarkable. Central vascular congestion and bilateral, centrally predominant, ill-defined interstitial and hazy airspace opacities are stable consistent with pulmonary edema. Small effusions are suspected. No pneumothorax.  IMPRESSION: Persistent pulmonary edema without change from the prior study. No new abnormalities.   Electronically Signed   By: Lajean Manes  M.D.   On: 01/27/2014 08:20   Dg Abd Acute W/chest  02/01/2014   CLINICAL DATA:  Abd pain  EXAM: ACUTE ABDOMEN SERIES (ABDOMEN 2 VIEW & CHEST 1 VIEW)  COMPARISON:  Portal chest radiograph 01/30/2014  FINDINGS: There is no evidence of dilated bowel loops or free intraperitoneal air. No radiopaque calculi or other significant radiographic abnormality is seen. The heart is enlarged. Left central venous catheter via subclavian approach tip superior vena cava. Persistent density left lung base. No acute osseous abnormalities. Decreased density right lung base.  IMPRESSION: Persistent airspace disease in left lung base. Improved disease right lung base. Nonobstructive bowel gas pattern. No evidence of free air.   Electronically Signed   By: Margaree Mackintosh M.D.   On: 02/01/2014 14:42      Dr. [name] has been paged and the MRI shows: The larger is in the subcutaneous tissues and has a thick enhancing rind, internal complexity, and internal gas density which may be from being previously drained although abscess is not completely excluded. The lesion in the laminectomy site at L5 spanning between the posterolateral rods continues to be thin-walled but exerts mass effect on the thecal sac and neural foramina from posterior, and is mildly complex. Given the thin walled appearance this  is probably a sterile postoperative fluid collection although again, abscess is difficult to totally exclude.   5:11 pm- Dr. [name] says he has seen the patient and she is going to the OR. He had seen her 30 minutes prior to my being notified.     Linus Mako, PA-C 02/25/14 1711

## 2014-02-25 NOTE — Progress Notes (Signed)
Left arm PICC with clear dressing with small amount bloody drainage.   Changed dressing per our protocol.  No drainage, swelling, or redness noted at site however patient c/o tenderness at site with touch and when cleaning site.  PICC remains out 4cm as was originally left at insertion.  Removed her extension from home and put a new cap on for staff to access.   Obtained good blood return and flushed it easily with 10cc NS.   She stated,  "I have 2 more weeks of antibiotics to go unless they extend it".   Continue to monitor.

## 2014-02-25 NOTE — ED Notes (Signed)
Pt noticed her pic line was bleeding this AM.  No swelling or redness noted.  Pt states it is tender at the site of pic line.  Pt had back surgery and had IV antibiotics.

## 2014-02-26 DIAGNOSIS — A491 Streptococcal infection, unspecified site: Secondary | ICD-10-CM

## 2014-02-26 DIAGNOSIS — L089 Local infection of the skin and subcutaneous tissue, unspecified: Secondary | ICD-10-CM

## 2014-02-26 DIAGNOSIS — B9689 Other specified bacterial agents as the cause of diseases classified elsewhere: Secondary | ICD-10-CM

## 2014-02-26 DIAGNOSIS — T148XXA Other injury of unspecified body region, initial encounter: Secondary | ICD-10-CM

## 2014-02-26 DIAGNOSIS — Y849 Medical procedure, unspecified as the cause of abnormal reaction of the patient, or of later complication, without mention of misadventure at the time of the procedure: Secondary | ICD-10-CM

## 2014-02-26 LAB — GLUCOSE, CAPILLARY
GLUCOSE-CAPILLARY: 290 mg/dL — AB (ref 70–99)
Glucose-Capillary: 289 mg/dL — ABNORMAL HIGH (ref 70–99)
Glucose-Capillary: 247 mg/dL — ABNORMAL HIGH (ref 70–99)
Glucose-Capillary: 292 mg/dL — ABNORMAL HIGH (ref 70–99)

## 2014-02-26 LAB — MRSA PCR SCREENING: MRSA by PCR: INVALID — AB

## 2014-02-26 MED ORDER — INSULIN ASPART 100 UNIT/ML ~~LOC~~ SOLN
0.0000 [IU] | Freq: Three times a day (TID) | SUBCUTANEOUS | Status: DC
  Administered 2014-02-26: 11 [IU] via SUBCUTANEOUS
  Administered 2014-02-26: 7 [IU] via SUBCUTANEOUS
  Administered 2014-02-27: 11 [IU] via SUBCUTANEOUS
  Administered 2014-02-27: 20 [IU] via SUBCUTANEOUS
  Administered 2014-02-27: 4 [IU] via SUBCUTANEOUS
  Administered 2014-02-28: 15 [IU] via SUBCUTANEOUS
  Administered 2014-02-28: 3 [IU] via SUBCUTANEOUS
  Administered 2014-03-01: 4 [IU] via SUBCUTANEOUS
  Administered 2014-03-01: 11 [IU] via SUBCUTANEOUS
  Administered 2014-03-01: 3 [IU] via SUBCUTANEOUS
  Administered 2014-03-02: 11 [IU] via SUBCUTANEOUS
  Administered 2014-03-02: 7 [IU] via SUBCUTANEOUS
  Administered 2014-03-02: 20 [IU] via SUBCUTANEOUS

## 2014-02-26 MED ORDER — DIPHENHYDRAMINE HCL 25 MG PO CAPS
25.0000 mg | ORAL_CAPSULE | Freq: Four times a day (QID) | ORAL | Status: DC | PRN
  Administered 2014-02-26: 25 mg via ORAL
  Filled 2014-02-26 (×2): qty 1

## 2014-02-26 MED ORDER — INSULIN ASPART 100 UNIT/ML ~~LOC~~ SOLN
0.0000 [IU] | Freq: Every day | SUBCUTANEOUS | Status: DC
Start: 2014-02-26 — End: 2014-03-02
  Administered 2014-02-26 (×2): 3 [IU] via SUBCUTANEOUS
  Administered 2014-02-27: 4 [IU] via SUBCUTANEOUS
  Administered 2014-02-28 – 2014-03-01 (×2): 2 [IU] via SUBCUTANEOUS

## 2014-02-26 NOTE — Progress Notes (Signed)
PT Cancellation Note  Patient Details Name: Marissa [name] MRN: [id number] DOB: Dec 29, 1975   Cancelled Treatment:    Reason Eval/Treat Not Completed: Pain limiting ability to participate. RN reports that pt received pain medication prior to PT session, however pt reports her pain is too severe to participate and rates at 7.5-8/10 at rest. Will attempt again this afternoon, time permitting.    Jolyn Lent 02/26/2014, 1:15 PM  Jolyn Lent, PT, DPT Acute Rehabilitation Services Pager: [phone]

## 2014-02-26 NOTE — Progress Notes (Signed)
Patient ID: Marissa [name], female   DOB: 1976-07-09, 38 y.o.   MRN: [id number] Overall she's okay have encouraged him back pain and some tightness in her legs but no new radiculopathy no new numbness or tingling.  Incision clean dry and intact drains functioning well slight increase weakness in left foot however feel this is more radiculitis as there was no surgical manipulation of a lumbosacral nerve roots per se  Pulmonary cultures negative for organisms his white blood cells. Continue back to Rocephin. We'll consult infectious disease for any change antibiotics.

## 2014-02-26 NOTE — Progress Notes (Signed)
OT Cancellation Note  Patient Details Name: Marissa [name] MRN: [id number] DOB: [birth year]   Cancelled Treatment:    Reason Eval/Treat Not Completed: Patient declined, no reason specified (refusal)  Peri Maris Pager: [phone]  02/26/2014, 11:17 AM

## 2014-02-26 NOTE — Progress Notes (Signed)
Inpatient Diabetes Program Recommendations  AACE/ADA: New Consensus Statement on Inpatient Glycemic Control (2013)  Target Ranges:  Prepandial:   less than 140 mg/dL      Peak postprandial:   less than 180 mg/dL (1-2 hours)      Critically ill patients:  140 - 180 mg/dL  Results for CECELY, RENGEL (MRN [id number]) as of 02/26/2014 14:06  Ref. Range 02/25/2014 23:56 02/26/2014 07:56 02/26/2014 12:18  Glucose-Capillary Latest Range: 70-99 mg/dL 255 (H) 289 (H) 247 (H)   Inpatient Diabetes Program Recommendations Insulin - Basal: consider adding Lantus or Levemir 20 units while hospitalized May also benefit from Novolog prandial dose. Will follow. Thank you  Raoul Pitch BSN, RN,CDE Inpatient Diabetes Coordinator [phone] (team pager)

## 2014-02-26 NOTE — Progress Notes (Signed)
Patient ID: Marissa [name], female   DOB: [birth year], 38 y.o.   MRN: [id number]         [facility]    Date of Admission:  02/25/2014           Day 36 ceftriaxone        Day 2 vancomycin  Active Problems:   Wound infection complicating hardware   . amitriptyline  100 mg Oral QHS  . carvedilol  25 mg Oral BID WC  . cefTRIAXone (ROCEPHIN)  IV  2 g Intravenous Daily  . gabapentin  600 mg Oral TID  . HYDROmorphone      . insulin aspart  0-20 Units Subcutaneous TID WC  . insulin aspart  0-5 Units Subcutaneous QHS  . Liraglutide  1.8 mg Subcutaneous Daily  . simvastatin  40 mg Oral q1800  . sodium chloride  3 mL Intravenous Q12H  . tiZANidine  4 mg Oral TID  . topiramate  125 mg Oral QHS  . vancomycin  1,000 mg Intravenous Q8H    Subjective: Complaining of postoperative pain. She states that she was having increased wound drainage, pain and renewed her fever at home recently. She has not been having any problems taking her tolerating her ceftriaxone.  Objective: Temp:  [98.2 F (36.8 C)-100.3 F (37.9 C)] 100.3 F (37.9 C) (07/16 0758) Pulse Rate:  [85-119] 93 (07/16 0400) Resp:  [11-42] 20 (07/16 0758) BP: (96-190)/(53-122) 145/66 mmHg (07/16 0758) SpO2:  [84 %-100 %] 98 % (07/16 0758) Weight:  [99.791 kg (220 lb)] 99.791 kg (220 lb) (07/15 1029)  General: Alert and in no distress Skin: PICC site appears normal Lungs: Clear Cor: Regular S1 and S2 with no murmurs Lumbar dressing intact  Lab Results Lab Results  Component Value Date   WBC 8.5 02/25/2014   HGB 9.9* 02/25/2014   HCT 31.1* 02/25/2014   MCV 82.7 02/25/2014   PLT 215 02/25/2014    Lab Results  Component Value Date   CREATININE 0.77 02/25/2014   BUN 11 02/25/2014   NA 143 02/25/2014   K 3.0* 02/25/2014   CL 100 02/25/2014   CO2 25 02/25/2014     Microbiology: Recent Results (from the past 240 hour(s))  URINE CULTURE     Status: None   Collection Time    02/17/14  1:57 AM     Result Value Ref Range Status   Specimen Description URINE, RANDOM   Final   Special Requests NONE   Final   Culture  Setup Time     Final   Value: 02/17/2014 02:14     Performed at [facility]     Final   Value: >=100,000 COLONIES/ML     Performed at [facility]   Culture     Final   Value: Multiple bacterial morphotypes present, none predominant. Suggest appropriate recollection if clinically indicated.     Performed at [facility]   Report Status 02/18/2014 FINAL   Final  WOUND CULTURE     Status: None   Collection Time    02/25/14  8:06 PM      Result Value Ref Range Status   Specimen Description WOUND BACK   Final   Special Requests SUPERFICIAL LUMBAR SUPERFICIAL LUMBAR   Final   Gram Stain     Final   Value: RARE WBC PRESENT,BOTH PMN AND MONONUCLEAR     NO SQUAMOUS EPITHELIAL CELLS SEEN  NO ORGANISMS SEEN     Performed at [facility]   Culture     Final   Value: NO GROWTH     Performed at [facility]   Report Status PENDING   Incomplete  ANAEROBIC CULTURE     Status: None   Collection Time    02/25/14  8:06 PM      Result Value Ref Range Status   Specimen Description WOUND BACK   Final   Special Requests SUPERFICIAL LUMBAR SUPERFICIAL LUMBAR   Final   Gram Stain     Final   Value: RARE WBC PRESENT,BOTH PMN AND MONONUCLEAR     NO SQUAMOUS EPITHELIAL CELLS SEEN     NO ORGANISMS SEEN     Performed at [facility]   Culture PENDING   Incomplete   Report Status PENDING   Incomplete  GRAM STAIN     Status: None   Collection Time    02/25/14  8:06 PM      Result Value Ref Range Status   Specimen Description WOUND BACK   Final   Special Requests SUPERFICIAL LUMBAR SUPERFICIAL LUMBAR   Final   Gram Stain     Final   Value: MODERATE WBC PRESENT,BOTH PMN AND MONONUCLEAR     NO ORGANISMS SEEN   Report Status 02/25/2014 FINAL   Final  WOUND CULTURE     Status: None   Collection Time    02/25/14   8:07 PM      Result Value Ref Range Status   Specimen Description WOUND BACK   Final   Special Requests SUPERFICIAL LUMBAR   Final   Gram Stain     Final   Value: FEW WBC PRESENT,BOTH PMN AND MONONUCLEAR     NO SQUAMOUS EPITHELIAL CELLS SEEN     NO ORGANISMS SEEN     Performed at [facility]   Culture     Final   Value: NO GROWTH     Performed at [facility]   Report Status PENDING   Incomplete  ANAEROBIC CULTURE     Status: None   Collection Time    02/25/14  8:07 PM      Result Value Ref Range Status   Specimen Description WOUND BACK   Final   Special Requests SUPERFICIAL LUMBAR SUPERFICIAL LUMBAR   Final   Gram Stain     Final   Value: FEW WBC PRESENT,BOTH PMN AND MONONUCLEAR     NO SQUAMOUS EPITHELIAL CELLS SEEN     NO ORGANISMS SEEN     Performed at [facility]   Culture PENDING   Incomplete   Report Status PENDING   Incomplete  GRAM STAIN     Status: None   Collection Time    02/25/14  8:07 PM      Result Value Ref Range Status   Specimen Description WOUND BACK   Final   Special Requests SUPERFICIAL LUMBAR SUPERFICIAL LUMBAR   Final   Gram Stain     Final   Value: ABUNDANT WBC PRESENT,BOTH PMN AND MONONUCLEAR     NO ORGANISMS SEEN   Report Status 02/25/2014 FINAL   Final  GRAM STAIN     Status: None   Collection Time    02/25/14  8:08 PM      Result Value Ref Range Status   Specimen Description WOUND   Final   Special Requests NO3 DEEP SPACE LUMBAR FLUID   Final  Gram Stain     Final   Value: RARE WBC PRESENT,BOTH PMN AND MONONUCLEAR     NO ORGANISMS SEEN   Report Status 02/25/2014 FINAL   Final  ANAEROBIC CULTURE     Status: None   Collection Time    02/25/14  8:08 PM      Result Value Ref Range Status   Specimen Description WOUND   Final   Special Requests NO3 DEEP SPACE LUMBAR FLUID   Final   Gram Stain     Final   Value: NO WBC SEEN     NO SQUAMOUS EPITHELIAL CELLS SEEN     NO ORGANISMS SEEN     Performed at [facility]   Culture PENDING   Incomplete   Report Status PENDING   Incomplete  WOUND CULTURE     Status: None   Collection Time    02/25/14  8:08 PM      Result Value Ref Range Status   Specimen Description WOUND   Final   Special Requests NO3 DEEP SPACE LUMBAR FLUID   Final   Gram Stain     Final   Value: NO WBC SEEN     NO SQUAMOUS EPITHELIAL CELLS SEEN     NO ORGANISMS SEEN     Performed at [facility]   Culture     Final   Value: NO GROWTH     Performed at [facility]   Report Status PENDING   Incomplete  GRAM STAIN     Status: None   Collection Time    02/25/14  8:29 PM      Result Value Ref Range Status   Specimen Description WOUND   Final   Special Requests LUMBAR WOUND EPIDURAL FLUID RECEIVED SWAB   Final   Gram Stain     Final   Value: ABUNDANT WBC PRESENT,BOTH PMN AND MONONUCLEAR     NO ORGANISMS SEEN   Report Status 02/25/2014 FINAL   Final  ANAEROBIC CULTURE     Status: None   Collection Time    02/25/14  8:29 PM      Result Value Ref Range Status   Specimen Description WOUND   Final   Special Requests LUMBAR WOUND EPIDURAL FLUID RECEIVED SWAB   Final   Gram Stain     Final   Value: FEW WBC PRESENT,BOTH PMN AND MONONUCLEAR     NO SQUAMOUS EPITHELIAL CELLS SEEN     NO ORGANISMS SEEN     Performed at [facility]   Culture PENDING   Incomplete   Report Status PENDING   Incomplete  WOUND CULTURE     Status: None   Collection Time    02/25/14  8:29 PM      Result Value Ref Range Status   Specimen Description WOUND   Final   Special Requests LUMBAR WOUND EPIDURAL FLUID RECEIVED SWAB   Final   Gram Stain     Final   Value: FEW WBC PRESENT,BOTH PMN AND MONONUCLEAR     NO SQUAMOUS EPITHELIAL CELLS SEEN     NO ORGANISMS SEEN     Performed at [facility]   Culture     Final   Value: NO GROWTH     Performed at [facility]   Report Status PENDING   Incomplete  MRSA PCR SCREENING     Status: Abnormal   Collection Time      02/25/14 11:10 PM  Result Value Ref Range Status   MRSA by PCR INVALID RESULTS, SPECIMEN SENT FOR CULTURE (*) NEGATIVE Final   Comment: Kenyon Ana RN [phone] AT 0425 SKEEN,P                The GeneXpert MRSA Assay (FDA     approved for NASAL specimens     only), is one component of a     comprehensive MRSA colonization     surveillance program. It is not     intended to diagnose MRSA     infection nor to guide or     monitor treatment for     MRSA infections.    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 The reason for her progressive infection is not entirely clear yet. Sometimes Enterobacter will develop resistance while on antibiotic therapy but she may also have superinfection with other more resistant bugs. I will continue ceftriaxone along with vancomycin pending final operative cultures.  Plan: 1. Continue vancomycin and ceftriaxone pending the final cultures  Michel Bickers, MD [facility] [place] [phone] pager   [phone] cell 02/26/2014, 10:27 AM

## 2014-02-26 NOTE — Progress Notes (Signed)
Pt refused all meds including insulin. Pt advised of importance of each medication and the purpose. She continued to decline the medications. She refused to speak when asked why she did not choose to take her medications. [name] boyfriend at bedside and states that "she does that sometimes." Pt curled her body in the bed and covered her head with a blanket refusing further conversation with the RN and boyfriend. Meds wasted.

## 2014-02-26 NOTE — Progress Notes (Signed)
PT Cancellation Note  Patient Details Name: LOA IDLER MRN: [id number] DOB: 11/29/1975   Cancelled Treatment:    Reason Eval/Treat Not Completed: Patient declined, no reason specified. Pt sitting in recliner with family members present in room. Pt declines PT services, stating she "just don't want to walk today." Will continue to follow and eval when able.    Jolyn Lent 02/26/2014, 2:20 PM  Jolyn Lent, PT, DPT Acute Rehabilitation Services Pager: [phone]

## 2014-02-26 NOTE — Progress Notes (Signed)
Patient arrived to floor calm and cooperative at 2013. A+Ox3, disoriented to time. Pt has been pleasant and taking meds without difficulty. Increased appetite and currently in room eating a sandwich.

## 2014-02-26 NOTE — Progress Notes (Signed)
Utilization review completed. Yasmin Dibello, RN, BSN. 

## 2014-02-26 NOTE — Progress Notes (Signed)
[facility]  Patient Status:   Active prior to readmission  [facility] is providing the following services: HHRN and [facility] for home IV ABX.  [facility] hospital team will follow while inpatient and support transition home when deemed appropriate.  If patient discharges after hours, please call [phone].   Marissa [name] 02/26/2014, 8:46 AM

## 2014-02-27 ENCOUNTER — Encounter (HOSPITAL_COMMUNITY): Payer: Self-pay | Admitting: Neurosurgery

## 2014-02-27 DIAGNOSIS — T8140XA Infection following a procedure, unspecified, initial encounter: Principal | ICD-10-CM

## 2014-02-27 DIAGNOSIS — B950 Streptococcus, group A, as the cause of diseases classified elsewhere: Secondary | ICD-10-CM

## 2014-02-27 DIAGNOSIS — R7881 Bacteremia: Secondary | ICD-10-CM

## 2014-02-27 LAB — CBC WITH DIFFERENTIAL/PLATELET
BASOS ABS: 0 10*3/uL (ref 0.0–0.1)
BASOS PCT: 0 % (ref 0–1)
Eosinophils Absolute: 0.3 10*3/uL (ref 0.0–0.7)
Eosinophils Relative: 4 % (ref 0–5)
HCT: 21.7 % — ABNORMAL LOW (ref 36.0–46.0)
Hemoglobin: 6.8 g/dL — CL (ref 12.0–15.0)
Lymphocytes Relative: 24 % (ref 12–46)
Lymphs Abs: 1.6 10*3/uL (ref 0.7–4.0)
MCH: 27.1 pg (ref 26.0–34.0)
MCHC: 31.3 g/dL (ref 30.0–36.0)
MCV: 86.5 fL (ref 78.0–100.0)
MONO ABS: 0.4 10*3/uL (ref 0.1–1.0)
Monocytes Relative: 6 % (ref 3–12)
NEUTROS ABS: 4.5 10*3/uL (ref 1.7–7.7)
NEUTROS PCT: 66 % (ref 43–77)
Platelets: 146 10*3/uL — ABNORMAL LOW (ref 150–400)
RBC: 2.51 MIL/uL — ABNORMAL LOW (ref 3.87–5.11)
RDW: 15.7 % — AB (ref 11.5–15.5)
WBC: 6.8 10*3/uL (ref 4.0–10.5)

## 2014-02-27 LAB — GLUCOSE, CAPILLARY
GLUCOSE-CAPILLARY: 280 mg/dL — AB (ref 70–99)
GLUCOSE-CAPILLARY: 333 mg/dL — AB (ref 70–99)
Glucose-Capillary: 376 mg/dL — ABNORMAL HIGH (ref 70–99)
Glucose-Capillary: 192 mg/dL — ABNORMAL HIGH (ref 70–99)

## 2014-02-27 LAB — VANCOMYCIN, TROUGH: VANCOMYCIN TR: 33.7 ug/mL — AB (ref 10.0–20.0)

## 2014-02-27 MED ORDER — VANCOMYCIN HCL IN DEXTROSE 1-5 GM/200ML-% IV SOLN
1000.0000 mg | Freq: Two times a day (BID) | INTRAVENOUS | Status: DC
  Administered 2014-02-27 – 2014-03-02 (×6): 1000 mg via INTRAVENOUS
  Filled 2014-02-27 (×8): qty 200

## 2014-02-27 MED ORDER — SODIUM CHLORIDE 0.9 % IV BOLUS (SEPSIS)
1000.0000 mL | Freq: Once | INTRAVENOUS | Status: AC
  Administered 2014-02-27: 1000 mL via INTRAVENOUS

## 2014-02-27 MED ORDER — SODIUM CHLORIDE 0.9 % IV BOLUS (SEPSIS)
500.0000 mL | Freq: Once | INTRAVENOUS | Status: AC
  Administered 2014-02-27: 500 mL via INTRAVENOUS

## 2014-02-27 NOTE — Progress Notes (Signed)
Patient ID: Marissa [name], female   DOB: [birth year], 38 y.o.   MRN: [id number]         [facility]    Date of Admission:  02/25/2014           Day 37 ceftriaxone        Day 3 vancomycin  Active Problems:   Wound infection complicating hardware   . amitriptyline  100 mg Oral QHS  . carvedilol  25 mg Oral BID WC  . cefTRIAXone (ROCEPHIN)  IV  2 g Intravenous Daily  . gabapentin  600 mg Oral TID  . insulin aspart  0-20 Units Subcutaneous TID WC  . insulin aspart  0-5 Units Subcutaneous QHS  . Liraglutide  1.8 mg Subcutaneous Daily  . simvastatin  40 mg Oral q1800  . sodium chloride  3 mL Intravenous Q12H  . tiZANidine  4 mg Oral TID  . topiramate  125 mg Oral QHS  . vancomycin  1,000 mg Intravenous Q12H    Subjective: Complaining of postoperative pain. She has refused some of her medications today and her boyfriend told the nurse that she does this sometimes.  Objective: Temp:  [98.3 F (36.8 C)-100.5 F (38.1 C)] 99.1 F (37.3 C) (07/17 1702) Pulse Rate:  [84-103] 89 (07/17 1702) Resp:  [16-18] 16 (07/17 1702) BP: (74-113)/(33-66) 82/54 mmHg (07/17 1702) SpO2:  [90 %-100 %] 100 % (07/17 1702) Weight:  [110.1 kg (242 lb 11.6 oz)] 110.1 kg (242 lb 11.6 oz) (07/16 2015)  General: Alert and in no distress Skin: PICC site appears normal Lungs: Clear Cor: Regular S1 and S2 with no murmurs Lumbar dressing intact  Lab Results Lab Results  Component Value Date   WBC 8.5 02/25/2014   HGB 9.9* 02/25/2014   HCT 31.1* 02/25/2014   MCV 82.7 02/25/2014   PLT 215 02/25/2014    Lab Results  Component Value Date   CREATININE 0.77 02/25/2014   BUN 11 02/25/2014   NA 143 02/25/2014   K 3.0* 02/25/2014   CL 100 02/25/2014   CO2 25 02/25/2014     Microbiology: Recent Results (from the past 240 hour(s))  WOUND CULTURE     Status: None   Collection Time    02/25/14  8:06 PM      Result Value Ref Range Status   Specimen Description WOUND BACK   Final   Special Requests SUPERFICIAL LUMBAR SUPERFICIAL LUMBAR   Final   Gram Stain     Final   Value: RARE WBC PRESENT,BOTH PMN AND MONONUCLEAR     NO SQUAMOUS EPITHELIAL CELLS SEEN     NO ORGANISMS SEEN     Performed at [facility]   Culture     Final   Value: NO GROWTH 1 DAY     Performed at [facility]   Report Status PENDING   Incomplete  ANAEROBIC CULTURE     Status: None   Collection Time    02/25/14  8:06 PM      Result Value Ref Range Status   Specimen Description WOUND BACK   Final   Special Requests SUPERFICIAL LUMBAR SUPERFICIAL LUMBAR   Final   Gram Stain     Final   Value: RARE WBC PRESENT,BOTH PMN AND MONONUCLEAR     NO SQUAMOUS EPITHELIAL CELLS SEEN     NO ORGANISMS SEEN     Performed at [facility]     Final  Value: NO ANAEROBES ISOLATED; CULTURE IN PROGRESS FOR 5 DAYS     Performed at [facility]   Report Status PENDING   Incomplete  FUNGUS CULTURE W SMEAR     Status: None   Collection Time    02/25/14  8:06 PM      Result Value Ref Range Status   Specimen Description WOUND BACK   Final   Special Requests SUPERFICIAL LUMBAR SUPERFICIAL LUMBAR   Final   Fungal Smear     Final   Value: NO YEAST OR FUNGAL ELEMENTS SEEN     Performed at [facility]   Culture     Final   Value: CULTURE IN PROGRESS FOR FOUR WEEKS     Performed at [facility]   Report Status PENDING   Incomplete  GRAM STAIN     Status: None   Collection Time    02/25/14  8:06 PM      Result Value Ref Range Status   Specimen Description WOUND BACK   Final   Special Requests SUPERFICIAL LUMBAR SUPERFICIAL LUMBAR   Final   Gram Stain     Final   Value: MODERATE WBC PRESENT,BOTH PMN AND MONONUCLEAR     NO ORGANISMS SEEN   Report Status 02/25/2014 FINAL   Final  WOUND CULTURE     Status: None   Collection Time    02/25/14  8:07 PM      Result Value Ref Range Status   Specimen Description WOUND BACK   Final   Special Requests  SUPERFICIAL LUMBAR   Final   Gram Stain     Final   Value: FEW WBC PRESENT,BOTH PMN AND MONONUCLEAR     NO SQUAMOUS EPITHELIAL CELLS SEEN     NO ORGANISMS SEEN     Performed at [facility]   Culture     Final   Value: NO GROWTH 1 DAY     Performed at [facility]   Report Status PENDING   Incomplete  ANAEROBIC CULTURE     Status: None   Collection Time    02/25/14  8:07 PM      Result Value Ref Range Status   Specimen Description WOUND BACK   Final   Special Requests SUPERFICIAL LUMBAR SUPERFICIAL LUMBAR   Final   Gram Stain     Final   Value: FEW WBC PRESENT,BOTH PMN AND MONONUCLEAR     NO SQUAMOUS EPITHELIAL CELLS SEEN     NO ORGANISMS SEEN     Performed at [facility]   Culture     Final   Value: NO ANAEROBES ISOLATED; CULTURE IN PROGRESS FOR 5 DAYS     Performed at [facility]   Report Status PENDING   Incomplete  FUNGUS CULTURE W SMEAR     Status: None   Collection Time    02/25/14  8:07 PM      Result Value Ref Range Status   Specimen Description WOUND BACK   Final   Special Requests SUPERFICIAL LUMBAR SUPERFICIAL LUMBAR   Final   Fungal Smear     Final   Value: NO YEAST OR FUNGAL ELEMENTS SEEN     Performed at [facility]   Culture     Final   Value: CULTURE IN PROGRESS FOR FOUR WEEKS     Performed at [facility]   Report Status PENDING   Incomplete  GRAM STAIN     Status: None  Collection Time    02/25/14  8:07 PM      Result Value Ref Range Status   Specimen Description WOUND BACK   Final   Special Requests SUPERFICIAL LUMBAR SUPERFICIAL LUMBAR   Final   Gram Stain     Final   Value: ABUNDANT WBC PRESENT,BOTH PMN AND MONONUCLEAR     NO ORGANISMS SEEN   Report Status 02/25/2014 FINAL   Final  GRAM STAIN     Status: None   Collection Time    02/25/14  8:08 PM      Result Value Ref Range Status   Specimen Description WOUND   Final   Special Requests NO3 DEEP SPACE LUMBAR FLUID   Final   Gram Stain      Final   Value: RARE WBC PRESENT,BOTH PMN AND MONONUCLEAR     NO ORGANISMS SEEN   Report Status 02/25/2014 FINAL   Final  ANAEROBIC CULTURE     Status: None   Collection Time    02/25/14  8:08 PM      Result Value Ref Range Status   Specimen Description WOUND   Final   Special Requests NO3 DEEP SPACE LUMBAR FLUID   Final   Gram Stain     Final   Value: NO WBC SEEN     NO SQUAMOUS EPITHELIAL CELLS SEEN     NO ORGANISMS SEEN     Performed at [facility]   Culture     Final   Value: NO ANAEROBES ISOLATED; CULTURE IN PROGRESS FOR 5 DAYS     Performed at [facility]   Report Status PENDING   Incomplete  WOUND CULTURE     Status: None   Collection Time    02/25/14  8:08 PM      Result Value Ref Range Status   Specimen Description WOUND   Final   Special Requests NO3 DEEP SPACE LUMBAR FLUID   Final   Gram Stain     Final   Value: NO WBC SEEN     NO SQUAMOUS EPITHELIAL CELLS SEEN     NO ORGANISMS SEEN     Performed at [facility]   Culture     Final   Value: NO GROWTH 1 DAY     Performed at [facility]   Report Status PENDING   Incomplete  FUNGUS CULTURE W SMEAR     Status: None   Collection Time    02/25/14  8:08 PM      Result Value Ref Range Status   Specimen Description WOUND   Final   Special Requests NO3 DEEP SPACE LUMBAR FLUID   Final   Fungal Smear     Final   Value: NO YEAST OR FUNGAL ELEMENTS SEEN     Performed at [facility]   Culture     Final   Value: CULTURE IN PROGRESS FOR FOUR WEEKS     Performed at [facility]   Report Status PENDING   Incomplete  GRAM STAIN     Status: None   Collection Time    02/25/14  8:29 PM      Result Value Ref Range Status   Specimen Description WOUND   Final   Special Requests LUMBAR WOUND EPIDURAL FLUID RECEIVED SWAB   Final   Gram Stain     Final   Value: ABUNDANT WBC PRESENT,BOTH PMN AND MONONUCLEAR     NO ORGANISMS SEEN   Report Status  02/25/2014 FINAL   Final    ANAEROBIC CULTURE     Status: None   Collection Time    02/25/14  8:29 PM      Result Value Ref Range Status   Specimen Description WOUND   Final   Special Requests LUMBAR WOUND EPIDURAL FLUID RECEIVED SWAB   Final   Gram Stain     Final   Value: FEW WBC PRESENT,BOTH PMN AND MONONUCLEAR     NO SQUAMOUS EPITHELIAL CELLS SEEN     NO ORGANISMS SEEN     Performed at [facility]   Culture     Final   Value: NO ANAEROBES ISOLATED; CULTURE IN PROGRESS FOR 5 DAYS     Performed at [facility]   Report Status PENDING   Incomplete  FUNGUS CULTURE W SMEAR     Status: None   Collection Time    02/25/14  8:29 PM      Result Value Ref Range Status   Specimen Description WOUND   Final   Special Requests LUMBAR WOUND EPIDURAL FLUID RECEIVED SWAB   Final   Fungal Smear     Final   Value: NO YEAST OR FUNGAL ELEMENTS SEEN     Performed at [facility]   Culture     Final   Value: CULTURE IN PROGRESS FOR FOUR WEEKS     Performed at [facility]   Report Status PENDING   Incomplete  WOUND CULTURE     Status: None   Collection Time    02/25/14  8:29 PM      Result Value Ref Range Status   Specimen Description WOUND   Final   Special Requests LUMBAR WOUND EPIDURAL FLUID RECEIVED SWAB   Final   Gram Stain     Final   Value: FEW WBC PRESENT,BOTH PMN AND MONONUCLEAR     NO SQUAMOUS EPITHELIAL CELLS SEEN     NO ORGANISMS SEEN     Performed at [facility]   Culture     Final   Value: NO GROWTH 1 DAY     Performed at [facility]   Report Status PENDING   Incomplete  MRSA PCR SCREENING     Status: Abnormal   Collection Time    02/25/14 11:10 PM      Result Value Ref Range Status   MRSA by PCR INVALID RESULTS, SPECIMEN SENT FOR CULTURE (*) NEGATIVE Final   Comment: Kenyon Ana RN [phone] AT 0425 SKEEN,P                The GeneXpert MRSA Assay (FDA     approved for NASAL specimens     only), is one component of a     comprehensive MRSA  colonization     surveillance program. It is not     intended to diagnose MRSA     infection nor to guide or     monitor treatment for     MRSA infections.  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 The reason for her progressive infection is not entirely clear but given her refusal of some medications here I wonder if he was taking her ceftriaxone at home correctly. I will continue ceftriaxone along with vancomycin pending final operative cultures.  Plan: 1. Continue vancomycin and ceftriaxone pending the final cultures  Michel Bickers, MD [facility] [place] [phone] pager   [phone] cell 02/27/2014, 5:51 PM

## 2014-02-27 NOTE — ED Provider Notes (Signed)
Medical screening examination/treatment/procedure(s) were conducted as a shared visit with non-physician practitioner(s) and myself.  I personally evaluated the patient during the encounter.   EKG Interpretation None      Dr. cranium will admit the patient for another washout. He has come down after the MRI results and will be admitting. Please refer to my other note as well.  Fredia Sorrow, MD 02/27/14 1413

## 2014-02-27 NOTE — Progress Notes (Signed)
Subjective: Patient reports Sole of back pain no new radicular symptoms  Objective: Vital signs in last 24 hours: Temp:  [98.3 F (36.8 C)-100.5 F (38.1 C)] 99.1 F (37.3 C) (07/17 1702) Pulse Rate:  [84-103] 89 (07/17 1702) Resp:  [16-18] 16 (07/17 1702) BP: (74-113)/(33-66) 82/54 mmHg (07/17 1702) SpO2:  [90 %-100 %] 100 % (07/17 1702) Weight:  [110.1 kg (242 lb 11.6 oz)] 110.1 kg (242 lb 11.6 oz) (07/16 2015)  Intake/Output from previous day: 07/16 0701 - 07/17 0700 In: 1178.8 [P.O.:480; I.V.:448.8; IV Piggyback:250] Out: 275 [Drains:275] Intake/Output this shift:    Strength improve in left lower extremities incision appears to be clean dry and intact drain output looks like it without to 75 the last 24 hours we'll continue with a drain  Lab Results:  Recent Labs  02/25/14 1151  WBC 8.5  HGB 9.9*  HCT 31.1*  PLT 215   BMET  Recent Labs  02/25/14 1151  NA 143  K 3.0*  CL 100  CO2 25  GLUCOSE 145*  BUN 11  CREATININE 0.77  CALCIUM 9.2    Studies/Results: No results found.  Assessment/Plan: Continue to await final culture results and ID recommendations for home antibiotics. Patient may be discharged home when the final home antibiotic questions are answered by ID. Would recommend continue Hemovac drainage while in the hospital.  LOS: 2 days     Jun Rightmyer P 02/27/2014, 6:12 PM

## 2014-02-27 NOTE — Clinical Social Work Note (Addendum)
CSW consulted for possible SNF placement at time of discharge. CSW will continue to follow pt for disposition recommendations. Thank you for the referral.  1:45pm Pt to be discharged home with home health services. CSW signing off.  Lubertha Sayres, MSW, [facility] Licensed Clinical Social Worker [phone] and [phone]

## 2014-02-27 NOTE — Progress Notes (Signed)
Patient c/o dizziness, BP 99/55. MD called to notify.

## 2014-02-27 NOTE — Evaluation (Signed)
Physical Therapy Evaluation Patient Details Name: Marissa [name] MRN: [id number] DOB: [birth year] Today's Date: 02/27/2014   History of Present Illness  38 yo female admitted due to drainage from wound site ( L5-s1 surg on 01/16/14 ) and fever 101. 02/25/14 Reexploration of lumbar wound debridement of devitalized tissue decompression of the thecal sac and aspiration of abscess fluid and sent cultures with irrigation with bacitracin irrigant Course of treatment: 01/16/14 surg L5-S1 d/c 01/21/14; ED admitted 01/24/14-02/05/14 due to infection; Ed visit 7/1; ED visit 7/3; ED visit 7/6; ED visit 7/7 and now admitted 02/25/14 Pt with hx of DM CAD MI GERD HTN  Clinical Impression  Pt admitted with/for further management of infection.  Pt currently limited functionally due to the problems listed below.  (see problems list.)  Pt will benefit from PT to maximize function and safety to be able to get home safely with available assist of family.     Follow Up Recommendations Home health PT;Supervision for mobility/OOB    Equipment Recommendations  None recommended by PT    Recommendations for Other Services       Precautions / Restrictions Precautions Precautions: Fall;Back Precaution Booklet Issued: Yes (comment) Precaution Comments: Reinforced the use of back precautions and spinal brace (pt reports she did not need the precautions at home and was ) Required Braces or Orthoses: Spinal Brace Spinal Brace: Other (comment) (pt stated she was hurting too much to put the brace on) Restrictions Weight Bearing Restrictions: No      Mobility  Bed Mobility Overal bed mobility: Needs Assistance Bed Mobility: Sit to Sidelying Rolling: Supervision Sidelying to sit: Supervision     Sit to sidelying: Supervision General bed mobility comments: Supervision for safety and back precautions. Pt moved slowly but able to perform independently with use of bed rails.   Transfers Overall transfer level: Needs  assistance Equipment used: None Transfers: Sit to/from Stand Sit to Stand: Min guard         General transfer comment: Min guard for safety. No VC's needed; good hand placement.   Ambulation/Gait Ambulation/Gait assistance: Min assist Ambulation Distance (Feet): 80 Feet Assistive device: Rolling walker (2 wheeled) Gait Pattern/deviations: Step-through pattern Gait velocity: slow   General Gait Details: Pt very tremulous and jerky today which necessitated min assist for safety.  Stairs            Wheelchair Mobility    Modified Rankin (Stroke Patients Only)       Balance Overall balance assessment: Needs assistance Sitting-balance support: No upper extremity supported Sitting balance-Leahy Scale: Good     Standing balance support: No upper extremity supported;During functional activity Standing balance-Leahy Scale: Poor Standing balance comment: pt too tremulous and jerky to maintain standing confidently.                             Pertinent Vitals/Pain     Home Living Family/patient expects to be discharged to:: Private residence Living Arrangements: Spouse/significant other;Other relatives Available Help at Discharge: Family;Available 24 hours/day Type of Home: House Home Access: Stairs to enter Entrance Stairs-Rails: None Entrance Stairs-Number of Steps: 3 Home Layout: One level Home Equipment: Walker - 2 wheels;Bedside commode      Prior Function Level of Independence: Independent         Comments: Pt reports she was full independent with ADLs without use of back precautions and no AD/AE.     Hand Dominance   Dominant Hand: Right  Extremity/Trunk Assessment   Upper Extremity Assessment: Overall WFL for tasks assessed           Lower Extremity Assessment: Generalized weakness (Tremulous and "jerky" both U and LE)      Cervical / Trunk Assessment: Normal  Communication   Communication: No difficulties  Cognition  Arousal/Alertness: Awake/alert Behavior During Therapy: WFL for tasks assessed/performed Overall Cognitive Status: Within Functional Limits for tasks assessed                      General Comments General comments (skin integrity, edema, etc.): Reinforce all the benefits of just mobilizing to get her through this ordeal.    Exercises        Assessment/Plan    PT Assessment Patient needs continued PT services  PT Diagnosis Difficulty walking;Generalized weakness   PT Problem List Decreased strength;Decreased activity tolerance;Decreased mobility;Decreased knowledge of precautions;Decreased safety awareness;Decreased knowledge of use of DME  PT Treatment Interventions DME instruction;Gait training;Functional mobility training;Stair training;Patient/family education   PT Goals (Current goals can be found in the Care Plan section) Acute Rehab PT Goals Patient Stated Goal: for this to be the last surgery PT Goal Formulation: With patient Time For Goal Achievement: 02/17/14 Potential to Achieve Goals: Good    Frequency Min 4X/week   Barriers to discharge        Co-evaluation               End of Session Equipment Utilized During Treatment: Other (comment) (pt declined the back brace even against advice) Activity Tolerance: Patient tolerated treatment well Patient left: in chair;with call bell/phone within reach Nurse Communication: Mobility status         Time: 5974-1638 PT Time Calculation (min): 20 min   Charges:   PT Evaluation $Initial PT Evaluation Tier I: 1 Procedure PT Treatments $Gait Training: 8-22 mins   PT G Codes:          Theresia Pree, Tessie Fass 02/27/2014, 1:07 PM  02/27/2014  Donnella Sham, PT [phone]  (pager)

## 2014-02-27 NOTE — Progress Notes (Signed)
CRITICAL VALUE ALERT  Critical value received:  Vanc  Date of notification:  02/27/14  Time of notification:  6384  Critical value read back:Yes.    Nurse who received alert:  Jetty Duhamel  MD notified (1st page):  Ashok Pall  Time of first page:  0550  MD notified (2nd page):  Time of second page:  Responding MD:  Ashok Pall  Time MD responded:  0600  MD notified. No new orders at this time. Pharmacy consulted. Will continue to monitor.

## 2014-02-27 NOTE — Progress Notes (Signed)
Occupational Therapy Evaluation Patient Details Name: Marissa [name] MRN: [id number] DOB: 28-Oct-1975 Today's Date: 02/27/2014    History of Present Illness 38 yo female admitted due to drainage from wound site ( L5-s1 surg on 01/16/14 ) and fever 101. 02/25/14 Reexploration of lumbar wound debridement of devitalized tissue decompression of the thecal sac and aspiration of abscess fluid and sent cultures with irrigation with bacitracin irrigant Course of treatment: 01/16/14 surg L5-S1 d/c 01/21/14; ED admitted 01/24/14-02/05/14 due to infection; Ed visit 7/1; ED visit 7/3; ED visit 7/6; ED visit 7/7 and now admitted 02/25/14 Pt with hx of DM CAD MI GERD HTN   Clinical Impression   PTA pt lived at home and reports that she was independent with ADLs and functional mobility and "did not need my back precautions because I was fine." Re-educated pt on back precautions and maintaining them until d/c from MD. Educated pt on importance of participation in therapy. Pt with high desire to be independent and would benefit from skilled OT to promote safety with ADLs.     Follow Up Recommendations  No OT follow up;Supervision/Assistance - 24 hour    Equipment Recommendations  None recommended by OT       Precautions / Restrictions Precautions Precautions: Fall;Back Precaution Booklet Issued: Yes (comment) Precaution Comments: Reinforced the use of back precautions and spinal brace (pt reports she did not need the precautions at home and was ) Required Braces or Orthoses: Spinal Brace Spinal Brace: Lumbar corset;Applied in sitting position Restrictions Weight Bearing Restrictions: No      Mobility Bed Mobility Overal bed mobility: Needs Assistance Bed Mobility: Rolling;Sidelying to Sit Rolling: Supervision Sidelying to sit: Supervision       General bed mobility comments: Supervision for safety and back precautions. Pt moved slowly but able to perform independently with use of bed rails.    Transfers Overall transfer level: Needs assistance Equipment used: None Transfers: Sit to/from Stand Sit to Stand: Min guard         General transfer comment: Min guard for safety. No VC's needed; good hand placement.     Balance Overall balance assessment: Needs assistance Sitting-balance support: No upper extremity supported;Feet supported Sitting balance-Leahy Scale: Good     Standing balance support: No upper extremity supported;During functional activity Standing balance-Leahy Scale: Fair Standing balance comment: Pt did not want external support, however feel it would benefit pt for balance and safety.                            ADL Overall ADL's : Needs assistance/impaired Eating/Feeding: Independent;Sitting   Grooming: Min guard;Standing   Upper Body Bathing: Set up;Sitting   Lower Body Bathing: Minimal assistance;Sit to/from stand   Upper Body Dressing : Set up;Sitting (including back brace)   Lower Body Dressing: Moderate assistance;Sit to/from stand   Toilet Transfer: Min guard;Ambulation Toilet Transfer Details (indicate cue type and reason): Pt moves slow and did not want to hold IV pole or therapist hand. Small steps and would benefit from AD if you can convince her to use it.  Toileting- Clothing Manipulation and Hygiene: Minimal assistance;Sit to/from stand   Tub/ Shower Transfer: Minimal assistance;Ambulation   Functional mobility during ADLs: Min guard (would have increased balance with RW) General ADL Comments: Pt is strongly independent and expressed frustration that she was back to independence and now requires back precautions. Pt verbalized 3/3 back precautions. Pt moving slowly and declined to hold IV pole  or therapist hand during ambulation and would benefit from RW to increase balance and safety with ambulation if able to convince her it to use it. Pt minimally conversant, but willing to do therapy.      Vision  Pt reports no  change from baseline.  No apparent visual deficits.                  Perception Perception Perception Tested?: No   Praxis Praxis Praxis tested?: Within functional limits    Pertinent Vitals/Pain Pt reports "the pain is still there" but did not rate or describe pain. Positioned pt in recliner for comfort.     Hand Dominance Right   Extremity/Trunk Assessment Upper Extremity Assessment Upper Extremity Assessment: Overall WFL for tasks assessed   Lower Extremity Assessment Lower Extremity Assessment: Defer to PT evaluation;Generalized weakness   Cervical / Trunk Assessment Cervical / Trunk Assessment: Normal   Communication Communication Communication: No difficulties   Cognition Arousal/Alertness: Awake/alert Behavior During Therapy: WFL for tasks assessed/performed Overall Cognitive Status: Within Functional Limits for tasks assessed                                Home Living Family/patient expects to be discharged to:: Private residence Living Arrangements: Spouse/significant other;Other relatives Available Help at Discharge: Family;Available 24 hours/day Type of Home: House Home Access: Stairs to enter [employer] of Steps: 3 Entrance Stairs-Rails: None Home Layout: One level     Bathroom Shower/Tub: Teacher, early years/pre: Standard     Home Equipment: Environmental consultant - 2 wheels;Bedside commode          Prior Functioning/Environment Level of Independence: Independent        Comments: Pt reports she was full independent with ADLs without use of back precautions and no AD/AE.    OT Diagnosis: Generalized weakness;Acute pain   OT Problem List: Decreased strength;Decreased activity tolerance;Impaired balance (sitting and/or standing);Decreased knowledge of use of DME or AE;Pain;Decreased knowledge of precautions   OT Treatment/Interventions: Self-care/ADL training;DME and/or AE instruction;Patient/family  education;Therapeutic activities    OT Goals(Current goals can be found in the care plan section) Acute Rehab OT Goals Patient Stated Goal: for this to be the last surgery OT Goal Formulation: With patient Time For Goal Achievement: 03/06/14 Potential to Achieve Goals: Good ADL Goals Pt Will Perform Grooming: with modified independence;standing Pt Will Perform Lower Body Bathing: with modified independence;with adaptive equipment;sit to/from stand Pt Will Perform Lower Body Dressing: with modified independence;with adaptive equipment;sit to/from stand Pt Will Transfer to Toilet: with modified independence;ambulating;regular height toilet Pt Will Perform Toileting - Clothing Manipulation and hygiene: with modified independence;sit to/from stand Pt Will Perform Tub/Shower Transfer: Tub transfer;with modified independence;ambulating;rolling walker Additional ADL Goal #1: Pt will peform bed mobility with Mod Independence using log roll technique to prepare for ADLs  OT Frequency: Min 2X/week              End of Session Equipment Utilized During Treatment: Gait belt Nurse Communication: Mobility status  Activity Tolerance: Patient tolerated treatment well Patient left: in chair;with call bell/phone within reach   Time: 1040-1101 OT Time Calculation (min): 21 min Charges:  OT General Charges $OT Visit: 1 Procedure OT Evaluation $Initial OT Evaluation Tier I: 1 Procedure OT Treatments $Self Care/Home Management : 8-22 mins  Juluis Rainier [phone] 02/27/2014, 11:15 AM

## 2014-02-27 NOTE — Progress Notes (Signed)
ANTIBIOTIC CONSULT NOTE - FOLLOW UP  Pharmacy Consult for vancomycin Indication: lumbar infection  Labs:  Recent Labs  02/25/14 1151  WBC 8.5  HGB 9.9*  PLT 215  CREATININE 0.77   Estimated Creatinine Clearance: 117.7 ml/min (by C-G formula based on Cr of 0.77).  Recent Labs  02/27/14 0345  VANCOTROUGH 33.7*     Microbiology: Recent Results (from the past 720 hour(s))  CLOSTRIDIUM DIFFICILE BY PCR     Status: None   Collection Time    01/28/14  9:35 PM      Result Value Ref Range Status   C difficile by pcr NEGATIVE  NEGATIVE Final  URINE CULTURE     Status: None   Collection Time    02/17/14  1:57 AM      Result Value Ref Range Status   Specimen Description URINE, RANDOM   Final   Special Requests NONE   Final   Culture  Setup Time     Final   Value: 02/17/2014 02:14     Performed at [place]     Final   Value: >=100,000 COLONIES/ML     Performed at [facility]   Culture     Final   Value: Multiple bacterial morphotypes present, none predominant. Suggest appropriate recollection if clinically indicated.     Performed at [facility]   Report Status 02/18/2014 FINAL   Final  WOUND CULTURE     Status: None   Collection Time    02/25/14  8:06 PM      Result Value Ref Range Status   Specimen Description WOUND BACK   Final   Special Requests SUPERFICIAL LUMBAR SUPERFICIAL LUMBAR   Final   Gram Stain     Final   Value: RARE WBC PRESENT,BOTH PMN AND MONONUCLEAR     NO SQUAMOUS EPITHELIAL CELLS SEEN     NO ORGANISMS SEEN     Performed at [facility]   Culture     Final   Value: NO GROWTH     Performed at [facility]   Report Status PENDING   Incomplete  ANAEROBIC CULTURE     Status: None   Collection Time    02/25/14  8:06 PM      Result Value Ref Range Status   Specimen Description WOUND BACK   Final   Special Requests SUPERFICIAL LUMBAR SUPERFICIAL LUMBAR   Final   Gram Stain     Final   Value: RARE WBC PRESENT,BOTH PMN AND MONONUCLEAR     NO SQUAMOUS EPITHELIAL CELLS SEEN     NO ORGANISMS SEEN     Performed at [facility]   Culture     Final   Value: NO ANAEROBES ISOLATED; CULTURE IN PROGRESS FOR 5 DAYS     Performed at [facility]   Report Status PENDING   Incomplete  FUNGUS CULTURE W SMEAR     Status: None   Collection Time    02/25/14  8:06 PM      Result Value Ref Range Status   Specimen Description WOUND BACK   Final   Special Requests SUPERFICIAL LUMBAR SUPERFICIAL LUMBAR   Final   Fungal Smear     Final   Value: NO YEAST OR FUNGAL ELEMENTS SEEN     Performed at [facility]   Culture     Final   Value: CULTURE IN PROGRESS FOR FOUR WEEKS  Performed at [facility]   Report Status PENDING   Incomplete  GRAM STAIN     Status: None   Collection Time    02/25/14  8:06 PM      Result Value Ref Range Status   Specimen Description WOUND BACK   Final   Special Requests SUPERFICIAL LUMBAR SUPERFICIAL LUMBAR   Final   Gram Stain     Final   Value: MODERATE WBC PRESENT,BOTH PMN AND MONONUCLEAR     NO ORGANISMS SEEN   Report Status 02/25/2014 FINAL   Final  WOUND CULTURE     Status: None   Collection Time    02/25/14  8:07 PM      Result Value Ref Range Status   Specimen Description WOUND BACK   Final   Special Requests SUPERFICIAL LUMBAR   Final   Gram Stain     Final   Value: FEW WBC PRESENT,BOTH PMN AND MONONUCLEAR     NO SQUAMOUS EPITHELIAL CELLS SEEN     NO ORGANISMS SEEN     Performed at [facility]   Culture     Final   Value: NO GROWTH     Performed at [facility]   Report Status PENDING   Incomplete  ANAEROBIC CULTURE     Status: None   Collection Time    02/25/14  8:07 PM      Result Value Ref Range Status   Specimen Description WOUND BACK   Final   Special Requests SUPERFICIAL LUMBAR SUPERFICIAL LUMBAR   Final   Gram Stain     Final   Value: FEW WBC PRESENT,BOTH PMN AND MONONUCLEAR      NO SQUAMOUS EPITHELIAL CELLS SEEN     NO ORGANISMS SEEN     Performed at [facility]   Culture     Final   Value: NO ANAEROBES ISOLATED; CULTURE IN PROGRESS FOR 5 DAYS     Performed at [facility]   Report Status PENDING   Incomplete  FUNGUS CULTURE W SMEAR     Status: None   Collection Time    02/25/14  8:07 PM      Result Value Ref Range Status   Specimen Description WOUND BACK   Final   Special Requests SUPERFICIAL LUMBAR SUPERFICIAL LUMBAR   Final   Fungal Smear     Final   Value: NO YEAST OR FUNGAL ELEMENTS SEEN     Performed at [facility]   Culture     Final   Value: CULTURE IN PROGRESS FOR FOUR WEEKS     Performed at [facility]   Report Status PENDING   Incomplete  GRAM STAIN     Status: None   Collection Time    02/25/14  8:07 PM      Result Value Ref Range Status   Specimen Description WOUND BACK   Final   Special Requests SUPERFICIAL LUMBAR SUPERFICIAL LUMBAR   Final   Gram Stain     Final   Value: ABUNDANT WBC PRESENT,BOTH PMN AND MONONUCLEAR     NO ORGANISMS SEEN   Report Status 02/25/2014 FINAL   Final  GRAM STAIN     Status: None   Collection Time    02/25/14  8:08 PM      Result Value Ref Range Status   Specimen Description WOUND   Final   Special Requests NO3 DEEP SPACE LUMBAR FLUID   Final   Gram Stain  Final   Value: RARE WBC PRESENT,BOTH PMN AND MONONUCLEAR     NO ORGANISMS SEEN   Report Status 02/25/2014 FINAL   Final  ANAEROBIC CULTURE     Status: None   Collection Time    02/25/14  8:08 PM      Result Value Ref Range Status   Specimen Description WOUND   Final   Special Requests NO3 DEEP SPACE LUMBAR FLUID   Final   Gram Stain     Final   Value: NO WBC SEEN     NO SQUAMOUS EPITHELIAL CELLS SEEN     NO ORGANISMS SEEN     Performed at [facility]   Culture     Final   Value: NO ANAEROBES ISOLATED; CULTURE IN PROGRESS FOR 5 DAYS     Performed at [facility]   Report Status PENDING    Incomplete  WOUND CULTURE     Status: None   Collection Time    02/25/14  8:08 PM      Result Value Ref Range Status   Specimen Description WOUND   Final   Special Requests NO3 DEEP SPACE LUMBAR FLUID   Final   Gram Stain     Final   Value: NO WBC SEEN     NO SQUAMOUS EPITHELIAL CELLS SEEN     NO ORGANISMS SEEN     Performed at [facility]   Culture     Final   Value: NO GROWTH     Performed at [facility]   Report Status PENDING   Incomplete  FUNGUS CULTURE W SMEAR     Status: None   Collection Time    02/25/14  8:08 PM      Result Value Ref Range Status   Specimen Description WOUND   Final   Special Requests NO3 DEEP SPACE LUMBAR FLUID   Final   Fungal Smear     Final   Value: NO YEAST OR FUNGAL ELEMENTS SEEN     Performed at [facility]   Culture     Final   Value: CULTURE IN PROGRESS FOR FOUR WEEKS     Performed at [facility]   Report Status PENDING   Incomplete  GRAM STAIN     Status: None   Collection Time    02/25/14  8:29 PM      Result Value Ref Range Status   Specimen Description WOUND   Final   Special Requests LUMBAR WOUND EPIDURAL FLUID RECEIVED SWAB   Final   Gram Stain     Final   Value: ABUNDANT WBC PRESENT,BOTH PMN AND MONONUCLEAR     NO ORGANISMS SEEN   Report Status 02/25/2014 FINAL   Final  ANAEROBIC CULTURE     Status: None   Collection Time    02/25/14  8:29 PM      Result Value Ref Range Status   Specimen Description WOUND   Final   Special Requests LUMBAR WOUND EPIDURAL FLUID RECEIVED SWAB   Final   Gram Stain     Final   Value: FEW WBC PRESENT,BOTH PMN AND MONONUCLEAR     NO SQUAMOUS EPITHELIAL CELLS SEEN     NO ORGANISMS SEEN     Performed at [facility]   Culture     Final   Value: NO ANAEROBES ISOLATED; CULTURE IN PROGRESS FOR 5 DAYS     Performed at [facility]   Report Status PENDING  Incomplete  FUNGUS CULTURE W SMEAR     Status: None   Collection Time    02/25/14  8:29 PM       Result Value Ref Range Status   Specimen Description WOUND   Final   Special Requests LUMBAR WOUND EPIDURAL FLUID RECEIVED SWAB   Final   Fungal Smear     Final   Value: NO YEAST OR FUNGAL ELEMENTS SEEN     Performed at [facility]   Culture     Final   Value: CULTURE IN PROGRESS FOR FOUR WEEKS     Performed at [facility]   Report Status PENDING   Incomplete  WOUND CULTURE     Status: None   Collection Time    02/25/14  8:29 PM      Result Value Ref Range Status   Specimen Description WOUND   Final   Special Requests LUMBAR WOUND EPIDURAL FLUID RECEIVED SWAB   Final   Gram Stain     Final   Value: FEW WBC PRESENT,BOTH PMN AND MONONUCLEAR     NO SQUAMOUS EPITHELIAL CELLS SEEN     NO ORGANISMS SEEN     Performed at [facility]   Culture     Final   Value: NO GROWTH     Performed at [facility]   Report Status PENDING   Incomplete  MRSA PCR SCREENING     Status: Abnormal   Collection Time    02/25/14 11:10 PM      Result Value Ref Range Status   MRSA by PCR INVALID RESULTS, SPECIMEN SENT FOR CULTURE (*) NEGATIVE Final   Comment: Kenyon Ana RN [phone] AT [address],P                The GeneXpert MRSA Assay (FDA     approved for NASAL specimens     only), is one component of a     comprehensive MRSA colonization     surveillance program. It is not     intended to diagnose MRSA     infection nor to guide or     monitor treatment for     MRSA infections.    Anti-infectives   Start     Dose/Rate Route Frequency Ordered Stop   02/26/14 0400  vancomycin (VANCOCIN) IVPB 1000 mg/200 mL premix  Status:  Discontinued     1,000 mg 200 mL/hr over 60 Minutes Intravenous Every 8 hours 02/25/14 2328 02/27/14 0555   02/26/14 0015  ceFAZolin (ANCEF) IVPB 1 g/50 mL premix     1 g 100 mL/hr over 30 Minutes Intravenous 3 times per day 02/25/14 2319 02/26/14 0642   02/25/14 2330  cefTRIAXone (ROCEPHIN) 2 g in dextrose 5 % 50 mL IVPB  Status:   Discontinued     2 g 100 mL/hr over 30 Minutes Intravenous Every 24 hours 02/25/14 2319 02/25/14 2324   02/25/14 2003  vancomycin (VANCOCIN) 1 GM/200ML IVPB    Comments:  Aydelette, Jamie   : cabinet override      02/25/14 2003 02/25/14 2011   02/25/14 2003  bacitracin 50,000 Units in sodium chloride irrigation 0.9 % 500 mL irrigation  Status:  Discontinued       As needed 02/25/14 2013 02/25/14 2136   02/25/14 1400  cefTRIAXone (ROCEPHIN) 2 g in dextrose 5 % 50 mL IVPB     2 g 100 mL/hr over 30 Minutes Intravenous Daily 02/25/14 1214  Assessment: 38yo female supratherapeutic on vancomycin with initial dosing for lumbar infection; next dose was given immediately following lab draw.  Goal of Therapy:  Vancomycin trough level 15-20 mcg/ml  Plan:  Will change vancomycin to 1000mg  IV Q12H for calculated trough of ~16 and continue to monitor.  Wynona Neat, PharmD, BCPS  02/27/2014,5:55 AM

## 2014-02-28 LAB — GLUCOSE, CAPILLARY
GLUCOSE-CAPILLARY: 170 mg/dL — AB (ref 70–99)
Glucose-Capillary: 309 mg/dL — ABNORMAL HIGH (ref 70–99)
Glucose-Capillary: 96 mg/dL (ref 70–99)

## 2014-02-28 LAB — WOUND CULTURE
CULTURE: NO GROWTH
Culture: NO GROWTH
Culture: NO GROWTH
Culture: NO GROWTH
Gram Stain: NONE SEEN

## 2014-02-28 LAB — MRSA CULTURE

## 2014-02-28 NOTE — Progress Notes (Signed)
Patient ID: Marissa [name], female   DOB: 1976-01-08, 38 y.o.   MRN: [id number] Subjective: Patient reports back soreness but no leg pain.  Objective: Vital signs in last 24 hours: Temp:  [98 F (36.7 C)-99.1 F (37.3 C)] 98.6 F (37 C) (07/18 0933) Pulse Rate:  [82-89] 89 (07/18 0933) Resp:  [16-18] 18 (07/18 0933) BP: (82-107)/(52-63) 107/63 mmHg (07/18 0933) SpO2:  [95 %-100 %] 95 % (07/18 0933)  Intake/Output from previous day: 07/17 0701 - 07/18 0700 In: -  Out: 680 [Urine:600; Drains:80] Intake/Output this shift: Total I/O In: 240 [P.O.:240] Out: -   Neurologic: Grossly normal  Lab Results: Lab Results  Component Value Date   WBC 6.8 02/27/2014   HGB 6.8* 02/27/2014   HCT 21.7* 02/27/2014   MCV 86.5 02/27/2014   PLT 146* 02/27/2014   Lab Results  Component Value Date   INR 1.10 02/25/2014   BMET Lab Results  Component Value Date   NA 143 02/25/2014   K 3.0* 02/25/2014   CL 100 02/25/2014   CO2 25 02/25/2014   GLUCOSE 145* 02/25/2014   BUN 11 02/25/2014   CREATININE 0.77 02/25/2014   CALCIUM 9.2 02/25/2014    Studies/Results: No results found.  Assessment/Plan: Continue IV antibiotics.   LOS: 3 days    Johntae Broxterman S 02/28/2014, 1:14 PM

## 2014-02-28 NOTE — Progress Notes (Signed)
Patient Marissa [name] is 6.8 reported from lab.

## 2014-02-28 NOTE — Progress Notes (Signed)
Physical Therapy Treatment Patient Details Name: Marissa [name] MRN: [id number] DOB: 06/16/1976 Today's Date: 02/28/2014    History of Present Illness 38 yo female admitted due to drainage from wound site ( L5-s1 surg on 01/16/14 ) and fever 101. 02/25/14 Reexploration of lumbar wound debridement of devitalized tissue decompression of the thecal sac and aspiration of abscess fluid and sent cultures with irrigation with bacitracin irrigant Course of treatment: 01/16/14 surg L5-S1 d/c 01/21/14; ED admitted 01/24/14-02/05/14 due to infection; Ed visit 7/1; ED visit 7/3; ED visit 7/6; ED visit 7/7 and now admitted 02/25/14 Pt with hx of DM CAD MI GERD HTN    PT Comments    Pt agreeable to participate in PT session but is resistant throughout for various things such as- refusing back brace initially but finally agreeable, refused to use RW, & resistant to attempt increased pace when ambulating.  Feel pt would benefit from RW to increase independence, conserve energy, & decrease pain when walking.     Follow Up Recommendations  Home health PT;Supervision for mobility/OOB     Equipment Recommendations  None recommended by PT    Recommendations for Other Services       Precautions / Restrictions Precautions Precautions: Fall;Back Precaution Comments: Reinforced the use of back precautions and spinal brace Required Braces or Orthoses: Spinal Brace Restrictions Weight Bearing Restrictions: No    Mobility  Bed Mobility Overal bed mobility: Needs Assistance Bed Mobility: Sidelying to Sit Rolling: Supervision Sidelying to sit: Supervision Supine to sit: Supervision   Sit to sidelying: Supervision General bed mobility comments: hob flat, no rails.  pt. resistant to instructions that would ease pain and promote safety.  Transfers Overall transfer level: Needs assistance Equipment used: None Transfers: Sit to/from Stand Sit to Stand: Min guard Stand pivot transfers: Min guard        General transfer comment: Pt pushing RW away from her & states "I don't need to use to walker"  Ambulation/Gait Ambulation/Gait assistance: Min guard Ambulation Distance (Feet): 80 Feet (40x2) Assistive device: None Gait Pattern/deviations: Step-through pattern;Decreased stride length Gait velocity: decreased   General Gait Details: Pt adamant about not using RW today.  With very slow & guarded gait.  Pt may benefit from use of RW to increase indepence & energy conservation.     Stairs            Wheelchair Mobility    Modified Rankin (Stroke Patients Only)       Balance                                    Cognition Arousal/Alertness: Awake/alert Behavior During Therapy: WFL for tasks assessed/performed Overall Cognitive Status: Within Functional Limits for tasks assessed                      Exercises      General Comments        Pertinent Vitals/Pain 8/10 back.  Premedicated.  Repositioned for comfort.      Home Living                      Prior Function            PT Goals (current goals can now be found in the care plan section) Acute Rehab PT Goals PT Goal Formulation: With patient Time For Goal Achievement: 02/17/14 Potential to Achieve Goals: Good Progress towards  PT goals: Progressing toward goals    Frequency  Min 4X/week    PT Plan Current plan remains appropriate    Co-evaluation PT/OT/SLP Co-Evaluation/Treatment: Yes Reason for Co-Treatment: For patient/therapist safety (due to low Hgb 6.8)         End of Session Equipment Utilized During Treatment: Gait belt;Back brace Activity Tolerance: Patient tolerated treatment well Patient left: in chair;with call bell/phone within reach;with chair alarm set     Time: 6213-0865 PT Time Calculation (min): 25 min  Charges:  $Gait Training: 8-22 mins                    G Codes:      Sena Hitch 02/28/2014, 1:47 PM  Sarajane Marek,  PTA [phone] 02/28/2014

## 2014-02-28 NOTE — Progress Notes (Signed)
Patient ID: Marissa [name], female   DOB: 15-Dec-1975, 38 y.o.   MRN: [id number]         [facility] for Infectious Disease    Date of Admission:  02/25/2014           Day 38 ceftriaxone        Day 4 vancomycin  Active Problems:   Wound infection complicating hardware   . amitriptyline  100 mg Oral QHS  . carvedilol  25 mg Oral BID WC  . cefTRIAXone (ROCEPHIN)  IV  2 g Intravenous Daily  . gabapentin  600 mg Oral TID  . insulin aspart  0-20 Units Subcutaneous TID WC  . insulin aspart  0-5 Units Subcutaneous QHS  . Liraglutide  1.8 mg Subcutaneous Daily  . simvastatin  40 mg Oral q1800  . sodium chloride  3 mL Intravenous Q12H  . tiZANidine  4 mg Oral TID  . topiramate  125 mg Oral QHS  . vancomycin  1,000 mg Intravenous Q12H    Subjective: Afebrile, but has back pain, she feels that she has subjective low grade fevers  Objective: Temp:  [98 F (36.7 C)-99.5 F (37.5 C)] 99.5 F (37.5 C) (07/18 1414) Pulse Rate:  [82-91] 91 (07/18 1414) Resp:  [16-20] 20 (07/18 1414) BP: (82-107)/(54-68) 106/68 mmHg (07/18 1414) SpO2:  [95 %-100 %] 100 % (07/18 1414)  General: Alert and in no distress Skin: PICC site appears normal Lungs: Clear Cor: Regular S1 and S2 with no murmurs Back: Lumbar dressing intact, drain in place  Lab Results Lab Results  Component Value Date   WBC 6.8 02/27/2014   HGB 6.8* 02/27/2014   HCT 21.7* 02/27/2014   MCV 86.5 02/27/2014   PLT 146* 02/27/2014    Lab Results  Component Value Date   CREATININE 0.77 02/25/2014   BUN 11 02/25/2014   NA 143 02/25/2014   K 3.0* 02/25/2014   CL 100 02/25/2014   CO2 25 02/25/2014     Microbiology: Recent Results (from the past 240 hour(s))  WOUND CULTURE     Status: None   Collection Time    02/25/14  8:06 PM      Result Value Ref Range Status   Specimen Description WOUND BACK   Final   Special Requests SUPERFICIAL LUMBAR SUPERFICIAL LUMBAR   Final   Gram Stain     Final   Value: RARE WBC PRESENT,BOTH  PMN AND MONONUCLEAR     NO SQUAMOUS EPITHELIAL CELLS SEEN     NO ORGANISMS SEEN     Performed at [facility]   Culture     Final   Value: NO GROWTH 2 DAYS     Performed at [facility]   Report Status 02/28/2014 FINAL   Final  ANAEROBIC CULTURE     Status: None   Collection Time    02/25/14  8:06 PM      Result Value Ref Range Status   Specimen Description WOUND BACK   Final   Special Requests SUPERFICIAL LUMBAR SUPERFICIAL LUMBAR   Final   Gram Stain     Final   Value: RARE WBC PRESENT,BOTH PMN AND MONONUCLEAR     NO SQUAMOUS EPITHELIAL CELLS SEEN     NO ORGANISMS SEEN     Performed at [facility]   Culture     Final   Value: NO ANAEROBES ISOLATED; CULTURE IN PROGRESS FOR 5 DAYS     Performed at [facility]  Report Status PENDING   Incomplete  FUNGUS CULTURE W SMEAR     Status: None   Collection Time    02/25/14  8:06 PM      Result Value Ref Range Status   Specimen Description WOUND BACK   Final   Special Requests SUPERFICIAL LUMBAR SUPERFICIAL LUMBAR   Final   Fungal Smear     Final   Value: NO YEAST OR FUNGAL ELEMENTS SEEN     Performed at [facility]   Culture     Final   Value: CULTURE IN PROGRESS FOR FOUR WEEKS     Performed at [facility]   Report Status PENDING   Incomplete  GRAM STAIN     Status: None   Collection Time    02/25/14  8:06 PM      Result Value Ref Range Status   Specimen Description WOUND BACK   Final   Special Requests SUPERFICIAL LUMBAR SUPERFICIAL LUMBAR   Final   Gram Stain     Final   Value: MODERATE WBC PRESENT,BOTH PMN AND MONONUCLEAR     NO ORGANISMS SEEN   Report Status 02/25/2014 FINAL   Final  WOUND CULTURE     Status: None   Collection Time    02/25/14  8:07 PM      Result Value Ref Range Status   Specimen Description WOUND BACK   Final   Special Requests SUPERFICIAL LUMBAR   Final   Gram Stain     Final   Value: FEW WBC PRESENT,BOTH PMN AND MONONUCLEAR     NO SQUAMOUS  EPITHELIAL CELLS SEEN     NO ORGANISMS SEEN     Performed at [facility]   Culture     Final   Value: NO GROWTH 2 DAYS     Performed at [facility]   Report Status 02/28/2014 FINAL   Final  ANAEROBIC CULTURE     Status: None   Collection Time    02/25/14  8:07 PM      Result Value Ref Range Status   Specimen Description WOUND BACK   Final   Special Requests SUPERFICIAL LUMBAR SUPERFICIAL LUMBAR   Final   Gram Stain     Final   Value: FEW WBC PRESENT,BOTH PMN AND MONONUCLEAR     NO SQUAMOUS EPITHELIAL CELLS SEEN     NO ORGANISMS SEEN     Performed at [facility]   Culture     Final   Value: NO ANAEROBES ISOLATED; CULTURE IN PROGRESS FOR 5 DAYS     Performed at [facility]   Report Status PENDING   Incomplete  FUNGUS CULTURE W SMEAR     Status: None   Collection Time    02/25/14  8:07 PM      Result Value Ref Range Status   Specimen Description WOUND BACK   Final   Special Requests SUPERFICIAL LUMBAR SUPERFICIAL LUMBAR   Final   Fungal Smear     Final   Value: NO YEAST OR FUNGAL ELEMENTS SEEN     Performed at [facility]   Culture     Final   Value: CULTURE IN PROGRESS FOR FOUR WEEKS     Performed at [facility]   Report Status PENDING   Incomplete  GRAM STAIN     Status: None   Collection Time    02/25/14  8:07 PM      Result Value Ref Range  Status   Specimen Description WOUND BACK   Final   Special Requests SUPERFICIAL LUMBAR SUPERFICIAL LUMBAR   Final   Gram Stain     Final   Value: ABUNDANT WBC PRESENT,BOTH PMN AND MONONUCLEAR     NO ORGANISMS SEEN   Report Status 02/25/2014 FINAL   Final  GRAM STAIN     Status: None   Collection Time    02/25/14  8:08 PM      Result Value Ref Range Status   Specimen Description WOUND   Final   Special Requests NO3 DEEP SPACE LUMBAR FLUID   Final   Gram Stain     Final   Value: RARE WBC PRESENT,BOTH PMN AND MONONUCLEAR     NO ORGANISMS SEEN   Report Status 02/25/2014 FINAL    Final  ANAEROBIC CULTURE     Status: None   Collection Time    02/25/14  8:08 PM      Result Value Ref Range Status   Specimen Description WOUND   Final   Special Requests NO3 DEEP SPACE LUMBAR FLUID   Final   Gram Stain     Final   Value: NO WBC SEEN     NO SQUAMOUS EPITHELIAL CELLS SEEN     NO ORGANISMS SEEN     Performed at [facility]   Culture     Final   Value: NO ANAEROBES ISOLATED; CULTURE IN PROGRESS FOR 5 DAYS     Performed at [facility]   Report Status PENDING   Incomplete  WOUND CULTURE     Status: None   Collection Time    02/25/14  8:08 PM      Result Value Ref Range Status   Specimen Description WOUND   Final   Special Requests NO3 DEEP SPACE LUMBAR FLUID   Final   Gram Stain     Final   Value: NO WBC SEEN     NO SQUAMOUS EPITHELIAL CELLS SEEN     NO ORGANISMS SEEN     Performed at [facility]   Culture     Final   Value: NO GROWTH 2 DAYS     Performed at [facility]   Report Status 02/28/2014 FINAL   Final  FUNGUS CULTURE W SMEAR     Status: None   Collection Time    02/25/14  8:08 PM      Result Value Ref Range Status   Specimen Description WOUND   Final   Special Requests NO3 DEEP SPACE LUMBAR FLUID   Final   Fungal Smear     Final   Value: NO YEAST OR FUNGAL ELEMENTS SEEN     Performed at [facility]   Culture     Final   Value: CULTURE IN PROGRESS FOR FOUR WEEKS     Performed at [facility]   Report Status PENDING   Incomplete  GRAM STAIN     Status: None   Collection Time    02/25/14  8:29 PM      Result Value Ref Range Status   Specimen Description WOUND   Final   Special Requests LUMBAR WOUND EPIDURAL FLUID RECEIVED SWAB   Final   Gram Stain     Final   Value: ABUNDANT WBC PRESENT,BOTH PMN AND MONONUCLEAR     NO ORGANISMS SEEN   Report Status 02/25/2014 FINAL   Final  ANAEROBIC CULTURE     Status: None   Collection  Time    02/25/14  8:29 PM      Result Value Ref Range Status    Specimen Description WOUND   Final   Special Requests LUMBAR WOUND EPIDURAL FLUID RECEIVED SWAB   Final   Gram Stain     Final   Value: FEW WBC PRESENT,BOTH PMN AND MONONUCLEAR     NO SQUAMOUS EPITHELIAL CELLS SEEN     NO ORGANISMS SEEN     Performed at [facility]   Culture     Final   Value: NO ANAEROBES ISOLATED; CULTURE IN PROGRESS FOR 5 DAYS     Performed at [facility]   Report Status PENDING   Incomplete  FUNGUS CULTURE W SMEAR     Status: None   Collection Time    02/25/14  8:29 PM      Result Value Ref Range Status   Specimen Description WOUND   Final   Special Requests LUMBAR WOUND EPIDURAL FLUID RECEIVED SWAB   Final   Fungal Smear     Final   Value: NO YEAST OR FUNGAL ELEMENTS SEEN     Performed at [facility]   Culture     Final   Value: CULTURE IN PROGRESS FOR FOUR WEEKS     Performed at [facility]   Report Status PENDING   Incomplete  WOUND CULTURE     Status: None   Collection Time    02/25/14  8:29 PM      Result Value Ref Range Status   Specimen Description WOUND   Final   Special Requests LUMBAR WOUND EPIDURAL FLUID RECEIVED SWAB   Final   Gram Stain     Final   Value: FEW WBC PRESENT,BOTH PMN AND MONONUCLEAR     NO SQUAMOUS EPITHELIAL CELLS SEEN     NO ORGANISMS SEEN     Performed at [facility]   Culture     Final   Value: NO GROWTH 2 DAYS     Performed at [facility]   Report Status 02/28/2014 FINAL   Final  MRSA PCR SCREENING     Status: Abnormal   Collection Time    02/25/14 11:10 PM      Result Value Ref Range Status   MRSA by PCR INVALID RESULTS, SPECIMEN SENT FOR CULTURE (*) NEGATIVE Final   Comment: Kenyon Ana RN [phone] AT 0425 SKEEN,P                The GeneXpert MRSA Assay (FDA     approved for NASAL specimens     only), is one component of a     comprehensive MRSA colonization     surveillance program. It is not     intended to diagnose MRSA     infection nor to guide or       monitor treatment for     MRSA infections.  MRSA CULTURE     Status: None   Collection Time    02/25/14 11:10 PM      Result Value Ref Range Status   Specimen Description NASAL SWAB   Final   Special Requests ADDED AT 9326 ON [id number]   Final   Culture     Final   Value: NO STAPHYLOCOCCUS AUREUS ISOLATED     Note: No MRSA Isolated     Performed at [facility]   Report Status 02/28/2014 FINAL   Final   Imaging on 7/15: Posterior subcutaneous fluid  collection with thick enhancing rind  measures 7.3 x 5.2 by 5.7 cm (previously 9.7 x 5.7 by 4.5 cm) and  continues to contain internal complex material and suspected  internal gas loculations. The anterior margin of this lesion is  along the superficial fascia margin and there is enhancement of the  adjacent paraspinal musculature.  Posterior to the spinal canal and tracking directly between the  posterolateral rods at the L5 and L5-S1 level, there is a 3.2 x 1.8  by 4.1 cm fluid collection within enhancing margins (previously 4.8  x 2.6 by 3.7 Cm). No well-defined connection of this fluid signal  intensity structure to the thecal sac. There is some minimal  complexity within this collection as on image 7 of series 6.  The lowest lumbar type non-rib-bearing vertebra is labeled as L5.  The conus medullaris appears normal. Conus level: T12.  No vertebral subluxation is observed. No significant new/  progressive vertebral edema.  Additional findings at individual levels are as follows:  Assessment: 38yo F with Lumbar fusion complicated by sepsis in early Julye due to group A strep bacteremia and GAS & Enterobacter lumbar wound infection s/p debridement, was discharged on ceftriaxone but still having wound dehiscence as outpatient;she was readmitted due to worsening clinically and mri showing complex fluid collection. She is POD #3 from 2nd I x D, repeat OR cultures do not show new pathogen. She is currently on ceftriaxone and  vancomycin. She reports taking her medicines regularly at home but since she had wound dehiscence, potentially new bacteria could have been introduced  Plan: 1. Continue vancomycin and ceftriaxone pending the final cultures  Carlyle Basques, [place] [facility] [place] [phone] pager   [phone] cell 02/28/2014, 3:29 PM

## 2014-02-28 NOTE — Progress Notes (Signed)
Occupational Therapy Treatment Patient Details Name: Marissa [name] MRN: [id number] DOB: 01/15/1976 Today's Date: 02/28/2014    History of present illness 38 yo female admitted due to drainage from wound site ( L5-s1 surg on 01/16/14 ) and fever 101. 02/25/14 Reexploration of lumbar wound debridement of devitalized tissue decompression of the thecal sac and aspiration of abscess fluid and sent cultures with irrigation with bacitracin irrigant Course of treatment: 01/16/14 surg L5-S1 d/c 01/21/14; ED admitted 01/24/14-02/05/14 due to infection; Ed visit 7/1; ED visit 7/3; ED visit 7/6; ED visit 7/7 and now admitted 02/25/14 Pt with hx of DM CAD MI GERD HTN   OT comments  Agreeable to skilled therapies but difficult to instruct.   Pt. Struggling with her previous progress vs. How she presents now.  Non-compliant with back precautions and required max encouragement to don brace.  Expresses desire to progress but challenges all instruction and implementation of safety techniques during functional movement and ADLS.   Follow Up Recommendations  No OT follow up;Supervision/Assistance - 24 hour    Equipment Recommendations  None recommended by OT          Precautions / Restrictions Precautions Precautions: Fall;Back Precaution Comments: Reinforced the use of back precautions and spinal brace Required Braces or Orthoses: Spinal Brace Restrictions Weight Bearing Restrictions: No       Mobility Bed Mobility Overal bed mobility: Needs Assistance Bed Mobility: Supine to Sit;Sidelying to Sit Rolling: Supervision Sidelying to sit: Supervision Supine to sit: Supervision   Sit to sidelying: Supervision General bed mobility comments: hob flat, no rails.  pt. resistant to instructions that would ease pain and promote safety.  Transfers Overall transfer level: Needs assistance Equipment used: None   Sit to Stand: Min guard Stand pivot transfers: Min guard       General transfer comment: refusal  to use RW or any device but did describe intermitent furniture walking at home.  small shuffled gait with multiple pauses.  unable to return demo or follow cues for gait (see PT notes)     Balance                                   ADL Overall ADL's : Needs assistance/impaired                 Upper Body Dressing : Set up;Sitting       Toilet Transfer: Min guard;Ambulation Toilet Transfer Details (indicate cue type and reason): simulated with transfers from bed to [place] and Hygiene: Minimal assistance Toileting - Clothing Manipulation Details (indicate cue type and reason): simulated during session       General ADL Comments: strong demand for independence but non compliant with any back precautions and required max encouragement to don brace.  states her wishes but continues to contradict them with her actions and refusal for safety.  Pertinent Vitals/ Pain       Grimacing but no complaints during session.                                                          Frequency Min 2X/week     Progress Toward Goals  OT Goals(current goals can now be found in the care plan section)  Progress towards OT goals: Progressing toward goals     Plan Discharge plan remains appropriate                     End of Session     Activity Tolerance Patient tolerated treatment well   Patient Left in chair;with call bell/phone within reach             Time: 0830-0850 OT Time Calculation (min): 20 min  Charges: OT General Charges $OT Visit: 1 Procedure OT Treatments $Self Care/Home Management : 8-22 mins (co-tx with PT)  Janice Coffin, COTA/L 02/28/2014, 11:25 AM

## 2014-02-28 NOTE — Progress Notes (Signed)
Talked with Dr. Ellene Route about patient Hemo. He wanted a repeat CBC, will continue to monitor.

## 2014-03-01 DIAGNOSIS — Z9889 Other specified postprocedural states: Secondary | ICD-10-CM

## 2014-03-01 DIAGNOSIS — Y838 Other surgical procedures as the cause of abnormal reaction of the patient, or of later complication, without mention of misadventure at the time of the procedure: Secondary | ICD-10-CM

## 2014-03-01 LAB — URINALYSIS W MICROSCOPIC (NOT AT ARMC)
Bilirubin Urine: NEGATIVE
GLUCOSE, UA: NEGATIVE mg/dL
Ketones, ur: NEGATIVE mg/dL
Nitrite: NEGATIVE
Protein, ur: NEGATIVE mg/dL
SPECIFIC GRAVITY, URINE: 1.01 (ref 1.005–1.030)
UROBILINOGEN UA: 0.2 mg/dL (ref 0.0–1.0)
pH: 5.5 (ref 5.0–8.0)

## 2014-03-01 LAB — CBC WITH DIFFERENTIAL/PLATELET
BASOS ABS: 0 10*3/uL (ref 0.0–0.1)
Basophils Relative: 0 % (ref 0–1)
Eosinophils Absolute: 0.3 10*3/uL (ref 0.0–0.7)
Eosinophils Relative: 5 % (ref 0–5)
HCT: 20 % — ABNORMAL LOW (ref 36.0–46.0)
Hemoglobin: 6.4 g/dL — CL (ref 12.0–15.0)
LYMPHS ABS: 1.4 10*3/uL (ref 0.7–4.0)
LYMPHS PCT: 25 % (ref 12–46)
MCH: 26.7 pg (ref 26.0–34.0)
MCHC: 32 g/dL (ref 30.0–36.0)
MCV: 83.3 fL (ref 78.0–100.0)
Monocytes Absolute: 0.4 10*3/uL (ref 0.1–1.0)
Monocytes Relative: 7 % (ref 3–12)
Neutro Abs: 3.5 10*3/uL (ref 1.7–7.7)
Neutrophils Relative %: 64 % (ref 43–77)
PLATELETS: 169 10*3/uL (ref 150–400)
RBC: 2.4 MIL/uL — AB (ref 3.87–5.11)
RDW: 15.4 % (ref 11.5–15.5)
WBC: 5.5 10*3/uL (ref 4.0–10.5)

## 2014-03-01 LAB — GLUCOSE, CAPILLARY
GLUCOSE-CAPILLARY: 178 mg/dL — AB (ref 70–99)
GLUCOSE-CAPILLARY: 199 mg/dL — AB (ref 70–99)
Glucose-Capillary: 224 mg/dL — ABNORMAL HIGH (ref 70–99)
Glucose-Capillary: 192 mg/dL — ABNORMAL HIGH (ref 70–99)
Glucose-Capillary: 277 mg/dL — ABNORMAL HIGH (ref 70–99)

## 2014-03-01 LAB — PREPARE RBC (CROSSMATCH)

## 2014-03-01 MED ORDER — DIPHENHYDRAMINE HCL 50 MG/ML IJ SOLN
INTRAMUSCULAR | Status: AC
Start: 2014-03-01 — End: 2014-03-01
  Administered 2014-03-01: 25 mg
  Filled 2014-03-01: qty 1

## 2014-03-01 MED ORDER — ALTEPLASE 2 MG IJ SOLR
2.0000 mg | Freq: Once | INTRAMUSCULAR | Status: AC
  Administered 2014-03-01: 2 mg
  Filled 2014-03-01: qty 2

## 2014-03-01 MED ORDER — DEXAMETHASONE SODIUM PHOSPHATE 4 MG/ML IJ SOLN
4.0000 mg | Freq: Once | INTRAMUSCULAR | Status: AC
  Administered 2014-03-01: 4 mg via INTRAVENOUS
  Filled 2014-03-01: qty 1

## 2014-03-01 NOTE — Progress Notes (Signed)
PT Cancellation Note  Patient Details Name: Marissa [name] MRN: [id number] DOB: [birth year]   Cancelled Treatment:    Reason Eval/Treat Not Completed: Patient not medically ready;Medical issues which prohibited therapy. HgB at 6.4; spoke with RN; pt symptomatic; c/o nausea; dizziness and weakness. Will hold on therapy today until pt is appropriate.    Elie Confer [place], [place] 03/01/2014, 10:05 AM

## 2014-03-01 NOTE — Progress Notes (Signed)
Patient ID: Marissa [name], female   DOB: 1976-06-10, 38 y.o.   MRN: [id number] Alert and oriented. Patient has been feeling weak in general Hemoglobin is decreased to 6.5 About 4 days ago it was 9.9 Hemoglobin has been repeated to verify I've advised patient to be transfused at least 2 units of packed red cells She is agreeable Continue his current treatment otherwise.

## 2014-03-01 NOTE — Progress Notes (Signed)
Patient ID: Marissa [name], female   DOB: [birth year], 38 y.o.   MRN: [id number]         [facility]    Date of Admission:  02/25/2014           Day 39 ceftriaxone        Day 5 vancomycin  Active Problems:   Wound infection complicating hardware   . alteplase  2 mg Intracatheter Once  . amitriptyline  100 mg Oral QHS  . carvedilol  25 mg Oral BID WC  . cefTRIAXone (ROCEPHIN)  IV  2 g Intravenous Daily  . gabapentin  600 mg Oral TID  . insulin aspart  0-20 Units Subcutaneous TID WC  . insulin aspart  0-5 Units Subcutaneous QHS  . Liraglutide  1.8 mg Subcutaneous Daily  . simvastatin  40 mg Oral q1800  . sodium chloride  3 mL Intravenous Q12H  . tiZANidine  4 mg Oral TID  . topiramate  125 mg Oral QHS  . vancomycin  1,000 mg Intravenous Q12H    Subjective: Afebrile, but has back pain, receiving blood tsf today  Objective: Temp:  [98.2 F (36.8 C)-99.5 F (37.5 C)] 98.5 F (36.9 C) (07/19 0903) Pulse Rate:  [74-91] 82 (07/19 0903) Resp:  [18-20] 18 (07/19 0903) BP: (86-111)/(60-69) 111/69 mmHg (07/19 0903) SpO2:  [94 %-100 %] 95 % (07/19 0903)  General: sleeping and in no distress Skin: PICC site appears normal Lungs: Clear Cor: Regular S1 and S2 with no murmurs Back: Lumbar dressing intact, drain in place  Lab Results Lab Results  Component Value Date   WBC 5.5 03/01/2014   HGB 6.4* 03/01/2014   HCT 20.0* 03/01/2014   MCV 83.3 03/01/2014   PLT 169 03/01/2014    Lab Results  Component Value Date   CREATININE 0.77 02/25/2014   BUN 11 02/25/2014   NA 143 02/25/2014   K 3.0* 02/25/2014   CL 100 02/25/2014   CO2 25 02/25/2014     Microbiology: Recent Results (from the past 240 hour(s))  WOUND CULTURE     Status: None   Collection Time    02/25/14  8:06 PM      Result Value Ref Range Status   Specimen Description WOUND BACK   Final   Special Requests SUPERFICIAL LUMBAR SUPERFICIAL LUMBAR   Final   Gram Stain     Final   Value: RARE  WBC PRESENT,BOTH PMN AND MONONUCLEAR     NO SQUAMOUS EPITHELIAL CELLS SEEN     NO ORGANISMS SEEN     Performed at [facility]   Culture     Final   Value: NO GROWTH 2 DAYS     Performed at [facility]   Report Status 02/28/2014 FINAL   Final  ANAEROBIC CULTURE     Status: None   Collection Time    02/25/14  8:06 PM      Result Value Ref Range Status   Specimen Description WOUND BACK   Final   Special Requests SUPERFICIAL LUMBAR SUPERFICIAL LUMBAR   Final   Gram Stain     Final   Value: RARE WBC PRESENT,BOTH PMN AND MONONUCLEAR     NO SQUAMOUS EPITHELIAL CELLS SEEN     NO ORGANISMS SEEN     Performed at [facility]   Culture     Final   Value: NO ANAEROBES ISOLATED; CULTURE IN PROGRESS FOR 5 DAYS     Performed at  [facility]   Report Status PENDING   Incomplete  FUNGUS CULTURE W SMEAR     Status: None   Collection Time    02/25/14  8:06 PM      Result Value Ref Range Status   Specimen Description WOUND BACK   Final   Special Requests SUPERFICIAL LUMBAR SUPERFICIAL LUMBAR   Final   Fungal Smear     Final   Value: NO YEAST OR FUNGAL ELEMENTS SEEN     Performed at [facility]   Culture     Final   Value: CULTURE IN PROGRESS FOR FOUR WEEKS     Performed at [facility]   Report Status PENDING   Incomplete  GRAM STAIN     Status: None   Collection Time    02/25/14  8:06 PM      Result Value Ref Range Status   Specimen Description WOUND BACK   Final   Special Requests SUPERFICIAL LUMBAR SUPERFICIAL LUMBAR   Final   Gram Stain     Final   Value: MODERATE WBC PRESENT,BOTH PMN AND MONONUCLEAR     NO ORGANISMS SEEN   Report Status 02/25/2014 FINAL   Final  WOUND CULTURE     Status: None   Collection Time    02/25/14  8:07 PM      Result Value Ref Range Status   Specimen Description WOUND BACK   Final   Special Requests SUPERFICIAL LUMBAR   Final   Gram Stain     Final   Value: FEW WBC PRESENT,BOTH PMN AND MONONUCLEAR      NO SQUAMOUS EPITHELIAL CELLS SEEN     NO ORGANISMS SEEN     Performed at [facility]   Culture     Final   Value: NO GROWTH 2 DAYS     Performed at [facility]   Report Status 02/28/2014 FINAL   Final  ANAEROBIC CULTURE     Status: None   Collection Time    02/25/14  8:07 PM      Result Value Ref Range Status   Specimen Description WOUND BACK   Final   Special Requests SUPERFICIAL LUMBAR SUPERFICIAL LUMBAR   Final   Gram Stain     Final   Value: FEW WBC PRESENT,BOTH PMN AND MONONUCLEAR     NO SQUAMOUS EPITHELIAL CELLS SEEN     NO ORGANISMS SEEN     Performed at [facility]   Culture     Final   Value: NO ANAEROBES ISOLATED; CULTURE IN PROGRESS FOR 5 DAYS     Performed at [facility]   Report Status PENDING   Incomplete  FUNGUS CULTURE W SMEAR     Status: None   Collection Time    02/25/14  8:07 PM      Result Value Ref Range Status   Specimen Description WOUND BACK   Final   Special Requests SUPERFICIAL LUMBAR SUPERFICIAL LUMBAR   Final   Fungal Smear     Final   Value: NO YEAST OR FUNGAL ELEMENTS SEEN     Performed at [facility]   Culture     Final   Value: CULTURE IN PROGRESS FOR FOUR WEEKS     Performed at [facility]   Report Status PENDING   Incomplete  GRAM STAIN     Status: None   Collection Time    02/25/14  8:07 PM  Result Value Ref Range Status   Specimen Description WOUND BACK   Final   Special Requests SUPERFICIAL LUMBAR SUPERFICIAL LUMBAR   Final   Gram Stain     Final   Value: ABUNDANT WBC PRESENT,BOTH PMN AND MONONUCLEAR     NO ORGANISMS SEEN   Report Status 02/25/2014 FINAL   Final  GRAM STAIN     Status: None   Collection Time    02/25/14  8:08 PM      Result Value Ref Range Status   Specimen Description WOUND   Final   Special Requests NO3 DEEP SPACE LUMBAR FLUID   Final   Gram Stain     Final   Value: RARE WBC PRESENT,BOTH PMN AND MONONUCLEAR     NO ORGANISMS SEEN   Report Status  02/25/2014 FINAL   Final  ANAEROBIC CULTURE     Status: None   Collection Time    02/25/14  8:08 PM      Result Value Ref Range Status   Specimen Description WOUND   Final   Special Requests NO3 DEEP SPACE LUMBAR FLUID   Final   Gram Stain     Final   Value: NO WBC SEEN     NO SQUAMOUS EPITHELIAL CELLS SEEN     NO ORGANISMS SEEN     Performed at [facility]   Culture     Final   Value: NO ANAEROBES ISOLATED; CULTURE IN PROGRESS FOR 5 DAYS     Performed at [facility]   Report Status PENDING   Incomplete  WOUND CULTURE     Status: None   Collection Time    02/25/14  8:08 PM      Result Value Ref Range Status   Specimen Description WOUND   Final   Special Requests NO3 DEEP SPACE LUMBAR FLUID   Final   Gram Stain     Final   Value: NO WBC SEEN     NO SQUAMOUS EPITHELIAL CELLS SEEN     NO ORGANISMS SEEN     Performed at [facility]   Culture     Final   Value: NO GROWTH 2 DAYS     Performed at [facility]   Report Status 02/28/2014 FINAL   Final  FUNGUS CULTURE W SMEAR     Status: None   Collection Time    02/25/14  8:08 PM      Result Value Ref Range Status   Specimen Description WOUND   Final   Special Requests NO3 DEEP SPACE LUMBAR FLUID   Final   Fungal Smear     Final   Value: NO YEAST OR FUNGAL ELEMENTS SEEN     Performed at [facility]   Culture     Final   Value: CULTURE IN PROGRESS FOR FOUR WEEKS     Performed at [facility]   Report Status PENDING   Incomplete  GRAM STAIN     Status: None   Collection Time    02/25/14  8:29 PM      Result Value Ref Range Status   Specimen Description WOUND   Final   Special Requests LUMBAR WOUND EPIDURAL FLUID RECEIVED SWAB   Final   Gram Stain     Final   Value: ABUNDANT WBC PRESENT,BOTH PMN AND MONONUCLEAR     NO ORGANISMS SEEN   Report Status 02/25/2014 FINAL   Final  ANAEROBIC CULTURE     Status:  None   Collection Time    02/25/14  8:29 PM      Result Value Ref  Range Status   Specimen Description WOUND   Final   Special Requests LUMBAR WOUND EPIDURAL FLUID RECEIVED SWAB   Final   Gram Stain     Final   Value: FEW WBC PRESENT,BOTH PMN AND MONONUCLEAR     NO SQUAMOUS EPITHELIAL CELLS SEEN     NO ORGANISMS SEEN     Performed at [facility]   Culture     Final   Value: NO ANAEROBES ISOLATED; CULTURE IN PROGRESS FOR 5 DAYS     Performed at [facility]   Report Status PENDING   Incomplete  FUNGUS CULTURE W SMEAR     Status: None   Collection Time    02/25/14  8:29 PM      Result Value Ref Range Status   Specimen Description WOUND   Final   Special Requests LUMBAR WOUND EPIDURAL FLUID RECEIVED SWAB   Final   Fungal Smear     Final   Value: NO YEAST OR FUNGAL ELEMENTS SEEN     Performed at [facility]   Culture     Final   Value: CULTURE IN PROGRESS FOR FOUR WEEKS     Performed at [facility]   Report Status PENDING   Incomplete  WOUND CULTURE     Status: None   Collection Time    02/25/14  8:29 PM      Result Value Ref Range Status   Specimen Description WOUND   Final   Special Requests LUMBAR WOUND EPIDURAL FLUID RECEIVED SWAB   Final   Gram Stain     Final   Value: FEW WBC PRESENT,BOTH PMN AND MONONUCLEAR     NO SQUAMOUS EPITHELIAL CELLS SEEN     NO ORGANISMS SEEN     Performed at [facility]   Culture     Final   Value: NO GROWTH 2 DAYS     Performed at [facility]   Report Status 02/28/2014 FINAL   Final  MRSA PCR SCREENING     Status: Abnormal   Collection Time    02/25/14 11:10 PM      Result Value Ref Range Status   MRSA by PCR INVALID RESULTS, SPECIMEN SENT FOR CULTURE (*) NEGATIVE Final   Comment: Kenyon Ana RN [phone] AT 0425 SKEEN,P                The GeneXpert MRSA Assay (FDA     approved for NASAL specimens     only), is one component of a     comprehensive MRSA colonization     surveillance program. It is not     intended to diagnose MRSA     infection  nor to guide or     monitor treatment for     MRSA infections.  MRSA CULTURE     Status: None   Collection Time    02/25/14 11:10 PM      Result Value Ref Range Status   Specimen Description NASAL SWAB   Final   Special Requests ADDED AT 4097 ON [id number]   Final   Culture     Final   Value: NO STAPHYLOCOCCUS AUREUS ISOLATED     Note: No MRSA Isolated     Performed at [facility]   Report Status 02/28/2014 FINAL   Final   Imaging on 7/15:  Posterior subcutaneous fluid collection with thick enhancing rind  measures 7.3 x 5.2 by 5.7 cm (previously 9.7 x 5.7 by 4.5 cm) and  continues to contain internal complex material and suspected  internal gas loculations. The anterior margin of this lesion is  along the superficial fascia margin and there is enhancement of the  adjacent paraspinal musculature.  Posterior to the spinal canal and tracking directly between the  posterolateral rods at the L5 and L5-S1 level, there is a 3.2 x 1.8  by 4.1 cm fluid collection within enhancing margins (previously 4.8  x 2.6 by 3.7 Cm). No well-defined connection of this fluid signal  intensity structure to the thecal sac. There is some minimal  complexity within this collection as on image 7 of series 6.  The lowest lumbar type non-rib-bearing vertebra is labeled as L5.  The conus medullaris appears normal. Conus level: T12.  No vertebral subluxation is observed. No significant new/  progressive vertebral edema.  Additional findings at individual levels are as follows:  Assessment: 38yo F with Lumbar fusion complicated by sepsis in early Julye due to group A strep bacteremia and GAS & Enterobacter lumbar wound infection s/p debridement, was discharged on ceftriaxone but still having wound dehiscence as outpatient;she was readmitted due to worsening clinically and mri showing complex fluid collection. She is POD #3 from 2nd I x D, repeat OR cultures do not show new pathogen. She is currently on  ceftriaxone and vancomycin. She reports taking her medicines regularly at home but since she had wound dehiscence, potentially new bacteria could have been introduced  Plan: 1. Will restart 6 wk of antibiotics using 7/16 as day 1 of antibiotic regimen of vancomycin and ceftriaxone. Final cultures did not reveal new pathogen, but concerned that skin flora pathogens such as CoNS may have been part of her wound infection since she had wound dehiscence after her first debridement 2. She will need weekly cbc, bmp, and vanco trough, per protocol and weekly picc line dressing change while on her antibiotics for 6 wk. vanco trough goal of 15-20. 3. We arrange to see her back in ID clinic in 2 wk and in 6 wk 4. Antibiotic end date is Aug 25th  Will sign off. Call if Adalberto Ill, [place] for [place] [phone] pager   [phone] cell 03/01/2014, 12:21 PM

## 2014-03-01 NOTE — Progress Notes (Signed)
MD paged and notified of patients symptoms due to transfusion.  Patient developed chills, SOB, temperature and BP increase about an hour after transfusion was started.  MD indicated that everything was properly done and to send blood with tubing to blood bank.  Transfusion was stopped, IV flushed with NS, 25 mg of Benadryl given, and tylenol.  MD ordered 4mg  of Decadron to be given to patient.  Will continue to monitor patient.

## 2014-03-02 LAB — BASIC METABOLIC PANEL
ANION GAP: 13 (ref 5–15)
BUN: 14 mg/dL (ref 6–23)
CHLORIDE: 108 meq/L (ref 96–112)
CO2: 17 mEq/L — ABNORMAL LOW (ref 19–32)
Calcium: 8.7 mg/dL (ref 8.4–10.5)
Creatinine, Ser: 1.06 mg/dL (ref 0.50–1.10)
GFR calc Af Amer: 76 mL/min — ABNORMAL LOW (ref >90)
GFR calc non Af Amer: 66 mL/min — ABNORMAL LOW (ref >90)
Glucose, Bld: 159 mg/dL — ABNORMAL HIGH (ref 70–99)
POTASSIUM: 4.2 meq/L (ref 3.7–5.3)
Sodium: 138 mEq/L (ref 137–147)

## 2014-03-02 LAB — ANAEROBIC CULTURE: GRAM STAIN: NONE SEEN

## 2014-03-02 LAB — VANCOMYCIN, TROUGH: Vancomycin Tr: 53.3 ug/mL (ref 10.0–20.0)

## 2014-03-02 LAB — TRANSFUSION REACTION
DAT C3: NEGATIVE
Post RXN DAT IgG: NEGATIVE

## 2014-03-02 LAB — GLUCOSE, CAPILLARY
GLUCOSE-CAPILLARY: 212 mg/dL — AB (ref 70–99)
Glucose-Capillary: 284 mg/dL — ABNORMAL HIGH (ref 70–99)
Glucose-Capillary: 363 mg/dL — ABNORMAL HIGH (ref 70–99)

## 2014-03-02 MED ORDER — VANCOMYCIN HCL IN DEXTROSE 1-5 GM/200ML-% IV SOLN: 1000.0000 mg | Freq: Two times a day (BID) | INTRAVENOUS | Status: DC

## 2014-03-02 MED ORDER — DEXTROSE 5 % IV SOLN: 2.0000 g | Freq: Every day | INTRAVENOUS | Status: DC

## 2014-03-02 MED ORDER — OXYCODONE HCL 5 MG PO TABS: 5.0000 mg | ORAL_TABLET | ORAL | Status: DC | PRN

## 2014-03-02 MED ORDER — HEPARIN SOD (PORK) LOCK FLUSH 100 UNIT/ML IV SOLN
250.0000 [IU] | INTRAVENOUS | Status: AC | PRN
  Administered 2014-03-02: 250 [IU]

## 2014-03-02 NOTE — Discharge Summary (Signed)
Physician Discharge Summary  Patient ID: KANDISE RIEHLE MRN: [id number] DOB/AGE: March 27, 1976 38 y.o.  Admit date: 02/25/2014 Discharge date: 03/02/2014  Admission Diagnoses:  Discharge Diagnoses:  Active Problems:   Wound infection complicating hardware   Discharged Condition: fair  Hospital Course: Patient admitted to the hospital where she underwent reexploration of her lumbar wound with debridement and further cultures. Patient's antibiotic coverage has been changed. Patient doing better. Ready for discharge home on home antibiotics.  Consults:  Significant Diagnostic Studies:   Treatments:   Discharge Exam: Blood pressure 99/54, pulse 88, temperature 97.9 F (36.6 C), temperature source Oral, resp. rate 18, height 5\' 5"  (1.651 m), weight 110.1 kg (242 lb 11.6 oz), SpO2 99.00%. Awake and alert. Oriented and appropriate. Motor and sensory function intact. Wound healing slowly. Disposition: 01-Home or Self Care     Medication List         amitriptyline 100 MG tablet  Commonly known as:  ELAVIL  Take 100 mg by mouth at bedtime.     carvedilol 25 MG tablet  Commonly known as:  COREG  Take 1 tablet (25 mg total) by mouth 2 (two) times daily with a meal.     cefTRIAXone 2 g in dextrose 5 % 50 mL  Inject 2 g into the vein daily.     cefTRIAXone 2 g in dextrose 5 % 50 mL  Inject 2 g into the vein daily.     gabapentin 300 MG capsule  Commonly known as:  NEURONTIN  Take 600 mg by mouth 3 (three) times daily.     insulin aspart 100 UNIT/ML FlexPen  Commonly known as:  NOVOLOG FLEXPEN  Inject 10-20 Units into the skin 3 (three) times daily with meals. Sliding scale     metoCLOPramide 10 MG tablet  Commonly known as:  REGLAN  Take 1 tablet (10 mg total) by mouth every 6 (six) hours as needed for nausea, vomiting or refractory nausea / vomiting.     nitroGLYCERIN 0.4 MG SL tablet  Commonly known as:  NITROSTAT  Place 0.4 mg under the tongue every 5 (five) minutes as  needed for chest pain.     oxyCODONE 5 MG immediate release tablet  Commonly known as:  Oxy IR/ROXICODONE  Take 1 tablet (5 mg total) by mouth every 4 (four) hours as needed for moderate pain.     simvastatin 40 MG tablet  Commonly known as:  ZOCOR  Take 40 mg by mouth daily.     SUMAtriptan 50 MG tablet  Commonly known as:  IMITREX  Take 50 mg by mouth every 2 (two) hours as needed for migraine or headache. May repeat in 2 hours if headache persists or recurs.     tiZANidine 4 MG tablet  Commonly known as:  ZANAFLEX  Take 1 tablet (4 mg total) by mouth 3 (three) times daily.     topiramate 100 MG tablet  Commonly known as:  TOPAMAX  Take 100 mg by mouth at bedtime. Take with 25 mg tablet for a 125 mg dose     topiramate 25 MG tablet  Commonly known as:  TOPAMAX  Take 25 mg by mouth at bedtime. Take with 100 mg tablet for a 125 mg dose     vancomycin 1 GM/200ML Soln  Commonly known as:  VANCOCIN  Inject 200 mLs (1,000 mg total) into the vein every 12 (twelve) hours.     VICTOZA 18 MG/3ML Sopn  Generic drug:  Liraglutide  Inject 1.8  mg into the skin daily.           Follow-up Information   Follow up with [facility], MD.   Specialty:  Neurosurgery   Contact information:   [address] [place] [address] [phone]       Signed: Earnie Larsson A 03/02/2014, 3:47 PM

## 2014-03-02 NOTE — Progress Notes (Signed)
Per Dr. Trenton Gammon patient will be going home with hemovacc and will have hemoglobin checked outpatient.  Will continue to monitor patient.

## 2014-03-02 NOTE — Progress Notes (Addendum)
ANTIBIOTIC CONSULT NOTE - FOLLOW UP  Pharmacy Consult for Vancomycin Indication: lumbar infection  Labs:  Recent Labs  02/27/14 2010 03/01/14 0532 03/02/14 1824  WBC 6.8 5.5  --   HGB 6.8* 6.4*  --   PLT 146* 169  --   CREATININE  --   --  1.06   Estimated Creatinine Clearance: 88.8 ml/min (by C-G formula based on Cr of 1.06).  Recent Labs  03/02/14 1824  VANCOTROUGH 53.3*     Microbiology: Recent Results (from the past 720 hour(s))  URINE CULTURE     Status: None   Collection Time    02/17/14  1:57 AM      Result Value Ref Range Status   Specimen Description URINE, RANDOM   Final   Special Requests NONE   Final   Culture  Setup Time     Final   Value: 02/17/2014 02:14     Performed at [facility]     Final   Value: >=100,000 COLONIES/ML     Performed at [facility]   Culture     Final   Value: Multiple bacterial morphotypes present, none predominant. Suggest appropriate recollection if clinically indicated.     Performed at [facility]   Report Status 02/18/2014 FINAL   Final  WOUND CULTURE     Status: None   Collection Time    02/25/14  8:06 PM      Result Value Ref Range Status   Specimen Description WOUND BACK   Final   Special Requests SUPERFICIAL LUMBAR SUPERFICIAL LUMBAR   Final   Gram Stain     Final   Value: RARE WBC PRESENT,BOTH PMN AND MONONUCLEAR     NO SQUAMOUS EPITHELIAL CELLS SEEN     NO ORGANISMS SEEN     Performed at [facility]   Culture     Final   Value: NO GROWTH 2 DAYS     Performed at [facility]   Report Status 02/28/2014 FINAL   Final  ANAEROBIC CULTURE     Status: None   Collection Time    02/25/14  8:06 PM      Result Value Ref Range Status   Specimen Description WOUND BACK   Final   Special Requests SUPERFICIAL LUMBAR SUPERFICIAL LUMBAR   Final   Gram Stain     Final   Value: RARE WBC PRESENT,BOTH PMN AND MONONUCLEAR     NO SQUAMOUS EPITHELIAL CELLS SEEN     NO  ORGANISMS SEEN     Performed at [facility]   Culture     Final   Value: NO ANAEROBES ISOLATED     Performed at [facility]   Report Status 03/02/2014 FINAL   Final  FUNGUS CULTURE W SMEAR     Status: None   Collection Time    02/25/14  8:06 PM      Result Value Ref Range Status   Specimen Description WOUND BACK   Final   Special Requests SUPERFICIAL LUMBAR SUPERFICIAL LUMBAR   Final   Fungal Smear     Final   Value: NO YEAST OR FUNGAL ELEMENTS SEEN     Performed at [facility]   Culture     Final   Value: CULTURE IN PROGRESS FOR FOUR WEEKS     Performed at [facility]   Report Status PENDING   Incomplete  GRAM STAIN  Status: None   Collection Time    02/25/14  8:06 PM      Result Value Ref Range Status   Specimen Description WOUND BACK   Final   Special Requests SUPERFICIAL LUMBAR SUPERFICIAL LUMBAR   Final   Gram Stain     Final   Value: MODERATE WBC PRESENT,BOTH PMN AND MONONUCLEAR     NO ORGANISMS SEEN   Report Status 02/25/2014 FINAL   Final  WOUND CULTURE     Status: None   Collection Time    02/25/14  8:07 PM      Result Value Ref Range Status   Specimen Description WOUND BACK   Final   Special Requests SUPERFICIAL LUMBAR   Final   Gram Stain     Final   Value: FEW WBC PRESENT,BOTH PMN AND MONONUCLEAR     NO SQUAMOUS EPITHELIAL CELLS SEEN     NO ORGANISMS SEEN     Performed at [facility]   Culture     Final   Value: NO GROWTH 2 DAYS     Performed at [facility]   Report Status 02/28/2014 FINAL   Final  ANAEROBIC CULTURE     Status: None   Collection Time    02/25/14  8:07 PM      Result Value Ref Range Status   Specimen Description WOUND BACK   Final   Special Requests SUPERFICIAL LUMBAR SUPERFICIAL LUMBAR   Final   Gram Stain     Final   Value: FEW WBC PRESENT,BOTH PMN AND MONONUCLEAR     NO SQUAMOUS EPITHELIAL CELLS SEEN     NO ORGANISMS SEEN     Performed at [facility]   Culture      Final   Value: NO ANAEROBES ISOLATED     Performed at [facility]   Report Status 03/02/2014 FINAL   Final  FUNGUS CULTURE W SMEAR     Status: None   Collection Time    02/25/14  8:07 PM      Result Value Ref Range Status   Specimen Description WOUND BACK   Final   Special Requests SUPERFICIAL LUMBAR SUPERFICIAL LUMBAR   Final   Fungal Smear     Final   Value: NO YEAST OR FUNGAL ELEMENTS SEEN     Performed at [facility]   Culture     Final   Value: CULTURE IN PROGRESS FOR FOUR WEEKS     Performed at [facility]   Report Status PENDING   Incomplete  GRAM STAIN     Status: None   Collection Time    02/25/14  8:07 PM      Result Value Ref Range Status   Specimen Description WOUND BACK   Final   Special Requests SUPERFICIAL LUMBAR SUPERFICIAL LUMBAR   Final   Gram Stain     Final   Value: ABUNDANT WBC PRESENT,BOTH PMN AND MONONUCLEAR     NO ORGANISMS SEEN   Report Status 02/25/2014 FINAL   Final  GRAM STAIN     Status: None   Collection Time    02/25/14  8:08 PM      Result Value Ref Range Status   Specimen Description WOUND   Final   Special Requests NO3 DEEP SPACE LUMBAR FLUID   Final   Gram Stain     Final   Value: RARE WBC PRESENT,BOTH PMN AND MONONUCLEAR     NO ORGANISMS SEEN  Report Status 02/25/2014 FINAL   Final  ANAEROBIC CULTURE     Status: None   Collection Time    02/25/14  8:08 PM      Result Value Ref Range Status   Specimen Description WOUND   Final   Special Requests NO3 DEEP SPACE LUMBAR FLUID   Final   Gram Stain     Final   Value: NO WBC SEEN     NO SQUAMOUS EPITHELIAL CELLS SEEN     NO ORGANISMS SEEN     Performed at [facility]   Culture     Final   Value: NO ANAEROBES ISOLATED     Performed at [facility]   Report Status 03/02/2014 FINAL   Final  WOUND CULTURE     Status: None   Collection Time    02/25/14  8:08 PM      Result Value Ref Range Status   Specimen Description WOUND   Final   Special  Requests NO3 DEEP SPACE LUMBAR FLUID   Final   Gram Stain     Final   Value: NO WBC SEEN     NO SQUAMOUS EPITHELIAL CELLS SEEN     NO ORGANISMS SEEN     Performed at [facility]   Culture     Final   Value: NO GROWTH 2 DAYS     Performed at [facility]   Report Status 02/28/2014 FINAL   Final  FUNGUS CULTURE W SMEAR     Status: None   Collection Time    02/25/14  8:08 PM      Result Value Ref Range Status   Specimen Description WOUND   Final   Special Requests NO3 DEEP SPACE LUMBAR FLUID   Final   Fungal Smear     Final   Value: NO YEAST OR FUNGAL ELEMENTS SEEN     Performed at [facility]   Culture     Final   Value: CULTURE IN PROGRESS FOR FOUR WEEKS     Performed at [facility]   Report Status PENDING   Incomplete  GRAM STAIN     Status: None   Collection Time    02/25/14  8:29 PM      Result Value Ref Range Status   Specimen Description WOUND   Final   Special Requests LUMBAR WOUND EPIDURAL FLUID RECEIVED SWAB   Final   Gram Stain     Final   Value: ABUNDANT WBC PRESENT,BOTH PMN AND MONONUCLEAR     NO ORGANISMS SEEN   Report Status 02/25/2014 FINAL   Final  ANAEROBIC CULTURE     Status: None   Collection Time    02/25/14  8:29 PM      Result Value Ref Range Status   Specimen Description WOUND   Final   Special Requests LUMBAR WOUND EPIDURAL FLUID RECEIVED SWAB   Final   Gram Stain     Final   Value: FEW WBC PRESENT,BOTH PMN AND MONONUCLEAR     NO SQUAMOUS EPITHELIAL CELLS SEEN     NO ORGANISMS SEEN     Performed at [facility]   Culture     Final   Value: NO ANAEROBES ISOLATED     Performed at [facility]   Report Status 03/02/2014 FINAL   Final  FUNGUS CULTURE W SMEAR     Status: None   Collection Time    02/25/14  8:29 PM  Result Value Ref Range Status   Specimen Description WOUND   Final   Special Requests LUMBAR WOUND EPIDURAL FLUID RECEIVED SWAB   Final   Fungal Smear     Final   Value: NO YEAST  OR FUNGAL ELEMENTS SEEN     Performed at [facility]   Culture     Final   Value: CULTURE IN PROGRESS FOR FOUR WEEKS     Performed at [facility]   Report Status PENDING   Incomplete  WOUND CULTURE     Status: None   Collection Time    02/25/14  8:29 PM      Result Value Ref Range Status   Specimen Description WOUND   Final   Special Requests LUMBAR WOUND EPIDURAL FLUID RECEIVED SWAB   Final   Gram Stain     Final   Value: FEW WBC PRESENT,BOTH PMN AND MONONUCLEAR     NO SQUAMOUS EPITHELIAL CELLS SEEN     NO ORGANISMS SEEN     Performed at [facility]   Culture     Final   Value: NO GROWTH 2 DAYS     Performed at [facility]   Report Status 02/28/2014 FINAL   Final  MRSA PCR SCREENING     Status: Abnormal   Collection Time    02/25/14 11:10 PM      Result Value Ref Range Status   MRSA by PCR INVALID RESULTS, SPECIMEN SENT FOR CULTURE (*) NEGATIVE Final   Comment: Kenyon Ana RN [phone] AT [address],P                The GeneXpert MRSA Assay (FDA     approved for NASAL specimens     only), is one component of a     comprehensive MRSA colonization     surveillance program. It is not     intended to diagnose MRSA     infection nor to guide or     monitor treatment for     MRSA infections.  MRSA CULTURE     Status: None   Collection Time    02/25/14 11:10 PM      Result Value Ref Range Status   Specimen Description NASAL SWAB   Final   Special Requests ADDED AT 3335 ON [id number]   Final   Culture     Final   Value: NO STAPHYLOCOCCUS AUREUS ISOLATED     Note: No MRSA Isolated     Performed at [facility]   Report Status 02/28/2014 FINAL   Final    Anti-infectives   Start     Dose/Rate Route Frequency Ordered Stop   03/02/14 0000  cefTRIAXone 2 g in dextrose 5 % 50 mL     2 g 100 mL/hr over 30 Minutes Intravenous Daily 03/02/14 1546     03/02/14 0000  vancomycin (VANCOCIN) 1 GM/200ML SOLN     1,000 mg 200 mL/hr over 60  Minutes Intravenous Every 12 hours 03/02/14 1546     02/27/14 2000  vancomycin (VANCOCIN) IVPB 1000 mg/200 mL premix     1,000 mg 200 mL/hr over 60 Minutes Intravenous Every 12 hours 02/27/14 0603     02/26/14 0400  vancomycin (VANCOCIN) IVPB 1000 mg/200 mL premix  Status:  Discontinued     1,000 mg 200 mL/hr over 60 Minutes Intravenous Every 8 hours 02/25/14 2328 02/27/14 0555   02/26/14 0015  ceFAZolin (ANCEF) IVPB 1 g/50 mL premix  1 g 100 mL/hr over 30 Minutes Intravenous 3 times per day 02/25/14 2319 02/26/14 0642   02/25/14 2330  cefTRIAXone (ROCEPHIN) 2 g in dextrose 5 % 50 mL IVPB  Status:  Discontinued     2 g 100 mL/hr over 30 Minutes Intravenous Every 24 hours 02/25/14 2319 02/25/14 2324   02/25/14 2003  vancomycin (VANCOCIN) 1 GM/200ML IVPB    Comments:  Aydelette, Jamie   : cabinet override      02/25/14 2003 02/25/14 2011   02/25/14 2003  bacitracin 50,000 Units in sodium chloride irrigation 0.9 % 500 mL irrigation  Status:  Discontinued       As needed 02/25/14 2013 02/25/14 2136   02/25/14 1400  cefTRIAXone (ROCEPHIN) 2 g in dextrose 5 % 50 mL IVPB     2 g 100 mL/hr over 30 Minutes Intravenous Daily 02/25/14 1214        Assessment: 38 yo female w/ lumbar wound infection & lumbar epidural abscess s/p wound debridement & abscess drainage. 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 Pharmacy was consulted to dose vancomycin for wound infection.   A vancomycin level was reported by the RN today and was found to be 53.3 at about 6:30pm. The dose was given at 5:30pm and the level should be interpreted as a peak. In an estimated 24 hrs the level will be less than 20.  Ceftriaxone 6/15 >>  Vancomycin 7/15 >>  7/15 Wound Cx NGTD 7/15 wound back cx x2 Neg 7/15 wound no3 deep space Neg 7/15 fungus wound back x 4 ngtd 7/15 MRSA cx nasal swab NGTD  VT 33.7 (7/17) on  1g q8, decr to 1g q12h  Goal of Therapy:  Vancomycin trough level 15-20 mcg/ml Vancomycin peak: 30-40  Plan:  -Spoke to [place]) and reported the level -Recommended change to Vancomycin 1500mg  IV q24h beginning 7/21 at 6pm. Per [facility] the recommendations will be passed to [facility] Pharmacist managing the patient   Erby Pian 03/02/2014 8:36 PM

## 2014-03-02 NOTE — Progress Notes (Signed)
CRITICAL VALUE ALERT  Critical value received:  Vancomycin trough 53.3  Date of notification:  03/02/14  Time of notification:  1930  Critical value read back:Yes.    Nurse who received alert: Elspeth Cho, RN  MD notified (1st page): Dr. Trenton Gammon  Time of first page:  1950  MD notified (2nd page):  Time of second page:  Responding MD:  Dr. Trenton Gammon  Time MD responded:  [phone]

## 2014-03-02 NOTE — Progress Notes (Signed)
[facility])  called for resumption of services for [facility] for home IV antibiotics, talked to [place] with [facility]- home antibiotics have been arranged for discharge home; Aneta Mins [phone]

## 2014-03-02 NOTE — Progress Notes (Signed)
Late entry: Pt discharged off of the floor around 2030. No orders given for previous vancomycin critical value. Dr. Trenton Gammon made aware. Elspeth Cho, RN 03/02/14 [phone]

## 2014-03-02 NOTE — Progress Notes (Signed)
ANTIBIOTIC CONSULT NOTE - FOLLOW UP  Pharmacy Consult for Vancomycin Indication: lumbar infection  Labs:  Recent Labs  02/27/14 2010 03/01/14 0532  WBC 6.8 5.5  HGB 6.8* 6.4*  PLT 146* 169   Estimated Creatinine Clearance: 117.7 ml/min (by C-G formula based on Cr of 0.77). No results found for this basename: VANCOTROUGH, Corlis Leak, VANCORANDOM, GENTTROUGH, GENTPEAK, GENTRANDOM, TOBRATROUGH, TOBRAPEAK, TOBRARND, AMIKACINPEAK, AMIKACINTROU, AMIKACIN,  in the last 72 hours   Microbiology: Recent Results (from the past 720 hour(s))  URINE CULTURE     Status: None   Collection Time    02/17/14  1:57 AM      Result Value Ref Range Status   Specimen Description URINE, RANDOM   Final   Special Requests NONE   Final   Culture  Setup Time     Final   Value: 02/17/2014 02:14     Performed at [place]     Final   Value: >=100,000 COLONIES/ML     Performed at [facility]   Culture     Final   Value: Multiple bacterial morphotypes present, none predominant. Suggest appropriate recollection if clinically indicated.     Performed at [facility]   Report Status 02/18/2014 FINAL   Final  WOUND CULTURE     Status: None   Collection Time    02/25/14  8:06 PM      Result Value Ref Range Status   Specimen Description WOUND BACK   Final   Special Requests SUPERFICIAL LUMBAR SUPERFICIAL LUMBAR   Final   Gram Stain     Final   Value: RARE WBC PRESENT,BOTH PMN AND MONONUCLEAR     NO SQUAMOUS EPITHELIAL CELLS SEEN     NO ORGANISMS SEEN     Performed at [facility]   Culture     Final   Value: NO GROWTH 2 DAYS     Performed at [facility]   Report Status 02/28/2014 FINAL   Final  ANAEROBIC CULTURE     Status: None   Collection Time    02/25/14  8:06 PM      Result Value Ref Range Status   Specimen Description WOUND BACK   Final   Special Requests SUPERFICIAL LUMBAR SUPERFICIAL LUMBAR   Final   Gram Stain     Final   Value:  RARE WBC PRESENT,BOTH PMN AND MONONUCLEAR     NO SQUAMOUS EPITHELIAL CELLS SEEN     NO ORGANISMS SEEN     Performed at [facility]   Culture     Final   Value: NO ANAEROBES ISOLATED     Performed at [facility]   Report Status 03/02/2014 FINAL   Final  FUNGUS CULTURE W SMEAR     Status: None   Collection Time    02/25/14  8:06 PM      Result Value Ref Range Status   Specimen Description WOUND BACK   Final   Special Requests SUPERFICIAL LUMBAR SUPERFICIAL LUMBAR   Final   Fungal Smear     Final   Value: NO YEAST OR FUNGAL ELEMENTS SEEN     Performed at [facility]   Culture     Final   Value: CULTURE IN PROGRESS FOR FOUR WEEKS     Performed at [facility]   Report Status PENDING   Incomplete  GRAM STAIN     Status: None   Collection Time  02/25/14  8:06 PM      Result Value Ref Range Status   Specimen Description WOUND BACK   Final   Special Requests SUPERFICIAL LUMBAR SUPERFICIAL LUMBAR   Final   Gram Stain     Final   Value: MODERATE WBC PRESENT,BOTH PMN AND MONONUCLEAR     NO ORGANISMS SEEN   Report Status 02/25/2014 FINAL   Final  WOUND CULTURE     Status: None   Collection Time    02/25/14  8:07 PM      Result Value Ref Range Status   Specimen Description WOUND BACK   Final   Special Requests SUPERFICIAL LUMBAR   Final   Gram Stain     Final   Value: FEW WBC PRESENT,BOTH PMN AND MONONUCLEAR     NO SQUAMOUS EPITHELIAL CELLS SEEN     NO ORGANISMS SEEN     Performed at [facility]   Culture     Final   Value: NO GROWTH 2 DAYS     Performed at [facility]   Report Status 02/28/2014 FINAL   Final  ANAEROBIC CULTURE     Status: None   Collection Time    02/25/14  8:07 PM      Result Value Ref Range Status   Specimen Description WOUND BACK   Final   Special Requests SUPERFICIAL LUMBAR SUPERFICIAL LUMBAR   Final   Gram Stain     Final   Value: FEW WBC PRESENT,BOTH PMN AND MONONUCLEAR     NO SQUAMOUS EPITHELIAL  CELLS SEEN     NO ORGANISMS SEEN     Performed at [facility]   Culture     Final   Value: NO ANAEROBES ISOLATED     Performed at [facility]   Report Status 03/02/2014 FINAL   Final  FUNGUS CULTURE W SMEAR     Status: None   Collection Time    02/25/14  8:07 PM      Result Value Ref Range Status   Specimen Description WOUND BACK   Final   Special Requests SUPERFICIAL LUMBAR SUPERFICIAL LUMBAR   Final   Fungal Smear     Final   Value: NO YEAST OR FUNGAL ELEMENTS SEEN     Performed at [facility]   Culture     Final   Value: CULTURE IN PROGRESS FOR FOUR WEEKS     Performed at [facility]   Report Status PENDING   Incomplete  GRAM STAIN     Status: None   Collection Time    02/25/14  8:07 PM      Result Value Ref Range Status   Specimen Description WOUND BACK   Final   Special Requests SUPERFICIAL LUMBAR SUPERFICIAL LUMBAR   Final   Gram Stain     Final   Value: ABUNDANT WBC PRESENT,BOTH PMN AND MONONUCLEAR     NO ORGANISMS SEEN   Report Status 02/25/2014 FINAL   Final  GRAM STAIN     Status: None   Collection Time    02/25/14  8:08 PM      Result Value Ref Range Status   Specimen Description WOUND   Final   Special Requests NO3 DEEP SPACE LUMBAR FLUID   Final   Gram Stain     Final   Value: RARE WBC PRESENT,BOTH PMN AND MONONUCLEAR     NO ORGANISMS SEEN   Report Status 02/25/2014 FINAL   Final  ANAEROBIC CULTURE     Status: None   Collection Time    02/25/14  8:08 PM      Result Value Ref Range Status   Specimen Description WOUND   Final   Special Requests NO3 DEEP SPACE LUMBAR FLUID   Final   Gram Stain     Final   Value: NO WBC SEEN     NO SQUAMOUS EPITHELIAL CELLS SEEN     NO ORGANISMS SEEN     Performed at [facility]   Culture     Final   Value: NO ANAEROBES ISOLATED     Performed at [facility]   Report Status 03/02/2014 FINAL   Final  WOUND CULTURE     Status: None   Collection Time    02/25/14  8:08  PM      Result Value Ref Range Status   Specimen Description WOUND   Final   Special Requests NO3 DEEP SPACE LUMBAR FLUID   Final   Gram Stain     Final   Value: NO WBC SEEN     NO SQUAMOUS EPITHELIAL CELLS SEEN     NO ORGANISMS SEEN     Performed at [facility]   Culture     Final   Value: NO GROWTH 2 DAYS     Performed at [facility]   Report Status 02/28/2014 FINAL   Final  FUNGUS CULTURE W SMEAR     Status: None   Collection Time    02/25/14  8:08 PM      Result Value Ref Range Status   Specimen Description WOUND   Final   Special Requests NO3 DEEP SPACE LUMBAR FLUID   Final   Fungal Smear     Final   Value: NO YEAST OR FUNGAL ELEMENTS SEEN     Performed at [facility]   Culture     Final   Value: CULTURE IN PROGRESS FOR FOUR WEEKS     Performed at [facility]   Report Status PENDING   Incomplete  GRAM STAIN     Status: None   Collection Time    02/25/14  8:29 PM      Result Value Ref Range Status   Specimen Description WOUND   Final   Special Requests LUMBAR WOUND EPIDURAL FLUID RECEIVED SWAB   Final   Gram Stain     Final   Value: ABUNDANT WBC PRESENT,BOTH PMN AND MONONUCLEAR     NO ORGANISMS SEEN   Report Status 02/25/2014 FINAL   Final  ANAEROBIC CULTURE     Status: None   Collection Time    02/25/14  8:29 PM      Result Value Ref Range Status   Specimen Description WOUND   Final   Special Requests LUMBAR WOUND EPIDURAL FLUID RECEIVED SWAB   Final   Gram Stain     Final   Value: FEW WBC PRESENT,BOTH PMN AND MONONUCLEAR     NO SQUAMOUS EPITHELIAL CELLS SEEN     NO ORGANISMS SEEN     Performed at [facility]   Culture     Final   Value: NO ANAEROBES ISOLATED     Performed at [facility]   Report Status 03/02/2014 FINAL   Final  FUNGUS CULTURE W SMEAR     Status: None   Collection Time    02/25/14  8:29 PM      Result Value Ref Range  Status   Specimen Description WOUND   Final   Special Requests LUMBAR  WOUND EPIDURAL FLUID RECEIVED SWAB   Final   Fungal Smear     Final   Value: NO YEAST OR FUNGAL ELEMENTS SEEN     Performed at [facility]   Culture     Final   Value: CULTURE IN PROGRESS FOR FOUR WEEKS     Performed at [facility]   Report Status PENDING   Incomplete  WOUND CULTURE     Status: None   Collection Time    02/25/14  8:29 PM      Result Value Ref Range Status   Specimen Description WOUND   Final   Special Requests LUMBAR WOUND EPIDURAL FLUID RECEIVED SWAB   Final   Gram Stain     Final   Value: FEW WBC PRESENT,BOTH PMN AND MONONUCLEAR     NO SQUAMOUS EPITHELIAL CELLS SEEN     NO ORGANISMS SEEN     Performed at [facility]   Culture     Final   Value: NO GROWTH 2 DAYS     Performed at [facility]   Report Status 02/28/2014 FINAL   Final  MRSA PCR SCREENING     Status: Abnormal   Collection Time    02/25/14 11:10 PM      Result Value Ref Range Status   MRSA by PCR INVALID RESULTS, SPECIMEN SENT FOR CULTURE (*) NEGATIVE Final   Comment: Kenyon Ana RN [phone] AT 0425 SKEEN,P                The GeneXpert MRSA Assay (FDA     approved for NASAL specimens     only), is one component of a     comprehensive MRSA colonization     surveillance program. It is not     intended to diagnose MRSA     infection nor to guide or     monitor treatment for     MRSA infections.  MRSA CULTURE     Status: None   Collection Time    02/25/14 11:10 PM      Result Value Ref Range Status   Specimen Description NASAL SWAB   Final   Special Requests ADDED AT 6160 ON 737106   Final   Culture     Final   Value: NO STAPHYLOCOCCUS AUREUS ISOLATED     Note: No MRSA Isolated     Performed at [facility]   Report Status 02/28/2014 FINAL   Final    Anti-infectives   Start     Dose/Rate Route Frequency Ordered Stop   02/27/14 2000  vancomycin (VANCOCIN) IVPB 1000 mg/200 mL premix     1,000 mg 200 mL/hr over 60 Minutes Intravenous Every  12 hours 02/27/14 0603     02/26/14 0400  vancomycin (VANCOCIN) IVPB 1000 mg/200 mL premix  Status:  Discontinued     1,000 mg 200 mL/hr over 60 Minutes Intravenous Every 8 hours 02/25/14 2328 02/27/14 0555   02/26/14 0015  ceFAZolin (ANCEF) IVPB 1 g/50 mL premix     1 g 100 mL/hr over 30 Minutes Intravenous 3 times per day 02/25/14 2319 02/26/14 0642   02/25/14 2330  cefTRIAXone (ROCEPHIN) 2 g in dextrose 5 % 50 mL IVPB  Status:  Discontinued     2 g 100 mL/hr over 30 Minutes Intravenous Every 24 hours 02/25/14 2319 02/25/14 2324   02/25/14 2003  vancomycin (  VANCOCIN) 1 GM/200ML IVPB    Comments:  Aydelette, Jamie   : cabinet override      02/25/14 2003 02/25/14 2011   02/25/14 2003  bacitracin 50,000 Units in sodium chloride irrigation 0.9 % 500 mL irrigation  Status:  Discontinued       As needed 02/25/14 2013 02/25/14 2136   02/25/14 1400  cefTRIAXone (ROCEPHIN) 2 g in dextrose 5 % 50 mL IVPB     2 g 100 mL/hr over 30 Minutes Intravenous Daily 02/25/14 1214        Assessment: 38 yo female w/ lumbar wound infection & lumbar epidural abscess s/p wound debridement & abscess drainage. 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 Pharmacy was consulted to dose vancomycin for wound infection. A vancomycin trough was planned for 7/19, but a dose was held due to a PRBC adverse reaction. No new labs today.  Ceftriaxone 6/15 >>  Vancomycin 7/15 >>  7/15 Wound Cx NGTD 7/15 wound back cx x2 Neg 7/15 wound no3 deep space Neg 7/15 fungus wound back x 4 ngtd 7/15 MRSA cx nasal swab NGTD  VT 33.7 (7/17) on 1g q8, decr to 1g q12h  Goal of Therapy:  Vancomycin trough level 15-20 mcg/ml  Plan:  - Continue ceftriaxone 2g IV daily per MD - Continue vancomycin 1000mg  IV q12h - Monitor renal function, cultures, WBC, and Tmax - Check VT at SS (vanc skipped last pm (7/19) d/t PRBC adverse  rxn so kinetics thrown off)  - Per ID, plan for 6 weeks of antibiotics using 7/16 as day 1 (8/25 end date)  Roderic Palau A. Pincus Badder, PharmD Clinical Pharmacist Pager: [phone] Pharmacy: [phone] 03/02/2014 1:47 PM

## 2014-03-02 NOTE — Progress Notes (Signed)
CARE MANAGEMENT NOTE 03/02/2014  Patient:  Marissa [name], Marissa [name]   Account Number:  [id number]  Date Initiated:  02/26/2014  Documentation initiated by:  MAYO,HENRIETTA  Subjective/Objective Assessment:   S/P exploration of lumbar wound/debridement/decompression of  thecal sac/aspiration of abscess; lives with spouse, active with [facility]     Anticipated DC Plan:  [place]  CM consult        Status of service:  In process, will continue to follow Medicare Important Message given?  YES (If response is "NO", the following Medicare IM given date fields will be blank) Date Medicare IM given:  03/02/2014 Medicare IM given by:  Olga Coaster  Per UR Regulation:  Reviewed for med. necessity/level of care/duration of stay  Comments: 720/2015- B Dniya Neuhaus RN,BSN,MHA [phone]

## 2014-03-02 NOTE — Progress Notes (Signed)
Occupational Therapy Treatment Patient Details Name: Marissa [name] MRN: [id number] DOB: [birth year] Today's Date: 03/02/2014    History of present illness 38 yo female admitted due to drainage from wound site ( L5-s1 surg on 01/16/14 ) and fever 101. 02/25/14 Reexploration of lumbar wound debridement of devitalized tissue decompression of the thecal sac and aspiration of abscess fluid and sent cultures with irrigation with bacitracin irrigant Course of treatment: 01/16/14 surg L5-S1 d/c 01/21/14; ED admitted 01/24/14-02/05/14 due to infection; Ed visit 7/1; ED visit 7/3; ED visit 7/6; ED visit 7/7 and now admitted 02/25/14 Pt with hx of DM CAD MI GERD HTN   OT comments  Pt progressing. Education provided during session. Pt refusing to use RW for ambulation.  Follow Up Recommendations  No OT follow up;Supervision/Assistance - 24 hour    Equipment Recommendations  None recommended by OT    Recommendations for Other Services      Precautions / Restrictions Precautions Precautions: Fall;Back Precaution Comments: Reviewed precautions Required Braces or Orthoses: Spinal Brace Spinal Brace: Lumbar corset;Applied in sitting position Restrictions Weight Bearing Restrictions: No       Mobility Bed Mobility Overal bed mobility: Needs Assistance Bed Mobility: Rolling;Sidelying to Sit Rolling: Min assist Sidelying to sit: Min guard       General bed mobility comments: cues for technique.  Transfers Overall transfer level: Needs assistance   Transfers: Sit to/from Stand Sit to Stand: Min guard;Min assist         General transfer comment: Assist to steady    Balance                                   ADL Overall ADL's : Needs assistance/impaired     Grooming: Wash/dry face;Set up;Supervision/safety;Sitting   Upper Body Bathing: Set up;Supervision/ safety;Sitting   Lower Body Bathing: Sit to/from stand;Maximal assistance   Upper Body Dressing : Sitting;Minimal  assistance   Lower Body Dressing: Total assistance (socks)   Toilet Transfer: Min guard;Ambulation;BSC (IV pole )   Toileting- Clothing Manipulation and Hygiene: Min guard;Sit to/from stand       Functional mobility during ADLs: Min guard (IV pole) General ADL Comments: Educated on AE for ADLs. Discussed safety. Reviewed to use cup for teeth care and placement of grooming items to avoid breaking precautions.        Vision                     Perception     Praxis      Cognition   Behavior During Therapy: [facility] for tasks assessed/performed Overall Cognitive Status: Within Functional Limits for tasks assessed                       Extremity/Trunk Assessment               Exercises     Shoulder Instructions       General Comments      Pertinent Vitals/ Pain       Pain 7.5/10. Increased activity during session-educated on position of pillow.  Home Living                                          Prior Functioning/Environment  Frequency Min 2X/week     Progress Toward Goals  OT Goals(current goals can now be found in the care plan section)  Progress towards OT goals: Progressing toward goals  Acute Rehab OT Goals Patient Stated Goal: not stated OT Goal Formulation: With patient Time For Goal Achievement: 03/06/14 Potential to Achieve Goals: Good ADL Goals Pt Will Perform Grooming: with modified independence;standing Pt Will Perform Lower Body Bathing: with modified independence;with adaptive equipment;sit to/from stand Pt Will Perform Lower Body Dressing: with modified independence;with adaptive equipment;sit to/from stand Pt Will Transfer to Toilet: with modified independence;ambulating;regular height toilet Pt Will Perform Toileting - Clothing Manipulation and hygiene: with modified independence;sit to/from stand Pt Will Perform Tub/Shower Transfer: Tub transfer;with modified  independence;ambulating;rolling walker Additional ADL Goal #1: Pt will peform bed mobility with Mod Independence using log roll technique to prepare for ADLs  Plan Discharge plan remains appropriate    Co-evaluation                 End of Session Equipment Utilized During Treatment: Gait belt;Back brace   Activity Tolerance Patient tolerated treatment well   Patient Left in chair;with call bell/phone within reach   Nurse Communication Mobility status        Time: 5790-3833 OT Time Calculation (min): 37 min  Charges: OT General Charges $OT Visit: 1 Procedure OT Treatments $Self Care/Home Management : 23-37 mins  Benito Mccreedy OTR/L [phone] 03/02/2014, 12:47 PM

## 2014-03-02 NOTE — Progress Notes (Signed)
Physical Therapy Treatment Patient Details Name: Marissa [name] MRN: [id number] DOB: 1975/10/18 Today's Date: 03/02/2014    History of Present Illness 38 yo female admitted due to drainage from wound site ( L5-s1 surg on 01/16/14 ) and fever 101. 02/25/14 Reexploration of lumbar wound debridement of devitalized tissue decompression of the thecal sac and aspiration of abscess fluid and sent cultures with irrigation with bacitracin irrigant Course of treatment: 01/16/14 surg L5-S1 d/c 01/21/14; ED admitted 01/24/14-02/05/14 due to infection; Ed visit 7/1; ED visit 7/3; ED visit 7/6; ED visit 7/7 and now admitted 02/25/14 Pt with hx of DM CAD MI GERD HTN    PT Comments    Patient making progress with mobility.  Able to ambulate with fairly good balance with no assistive device.  Follow Up Recommendations  Home health PT;Supervision for mobility/OOB     Equipment Recommendations  None recommended by PT    Recommendations for Other Services       Precautions / Restrictions Precautions Precautions: Fall;Back Precaution Comments: Reviewed precautions Required Braces or Orthoses: Spinal Brace Spinal Brace: Lumbar corset;Applied in sitting position Restrictions Weight Bearing Restrictions: No    Mobility  Bed Mobility Overal bed mobility: Needs Assistance Bed Mobility: Sit to Sidelying Rolling: Min assist Sidelying to sit: Min guard     Sit to sidelying: Supervision General bed mobility comments: Patient using correct technique.  HOB flat with no rail.  Transfers Overall transfer level: Needs assistance Equipment used: None Transfers: Sit to/from Stand Sit to Stand: Min guard         General transfer comment: Assist for safety/balance  Ambulation/Gait Ambulation/Gait assistance: Min guard Ambulation Distance (Feet): 82 Feet Assistive device: None Gait Pattern/deviations: Step-through pattern;Decreased stride length;Wide base of support (Lateral trunk flexion to both  sides) Gait velocity: decreased Gait velocity interpretation: Below normal speed for age/gender General Gait Details: Patient declined use of RW.  Had patient ambulate without holding IV pole.  No loss of balance.  Slow gait pattern with fairly good balance.   Stairs Stairs:  (Patient declined today)          Wheelchair Mobility    Modified Rankin (Stroke Patients Only)       Balance           Standing balance support: No upper extremity supported Standing balance-Leahy Scale: Fair                      Cognition Arousal/Alertness: Awake/alert Behavior During Therapy: WFL for tasks assessed/performed Overall Cognitive Status: Within Functional Limits for tasks assessed                      Exercises      General Comments        Pertinent Vitals/Pain Pain 7/10    Home Living                      Prior Function            PT Goals (current goals can now be found in the care plan section) Acute Rehab PT Goals Patient Stated Goal: not stated Progress towards PT goals: Progressing toward goals    Frequency  Min 4X/week    PT Plan Current plan remains appropriate    Co-evaluation             End of Session Equipment Utilized During Treatment: Gait belt;Back brace Activity Tolerance: Patient tolerated treatment well Patient left: in bed;with  call bell/phone within reach     Time: 1351-1405 PT Time Calculation (min): 14 min  Charges:  $Gait Training: 8-22 mins                    G Codes:      Despina Pole 03/16/2014, 2:17 PM Carita Pian. Sanjuana Kava, [place] Pager [phone]

## 2014-03-03 ENCOUNTER — Inpatient Hospital Stay: Payer: Self-pay | Admitting: Family Medicine

## 2014-03-03 LAB — BASIC METABOLIC PANEL
Anion Gap: 8 (ref 7–16)
BUN: 12 mg/dL (ref 7–18)
CO2: 25 mmol/L (ref 21–32)
Calcium, Total: 8.9 mg/dL (ref 8.5–10.1)
Chloride: 109 mmol/L — ABNORMAL HIGH (ref 98–107)
Creatinine: 1.36 mg/dL — ABNORMAL HIGH (ref 0.60–1.30)
GFR CALC AF AMER: 57 — AB
GFR CALC NON AF AMER: 49 — AB
Glucose: 180 mg/dL — ABNORMAL HIGH (ref 65–99)
Osmolality: 287 (ref 275–301)
POTASSIUM: 3.6 mmol/L (ref 3.5–5.1)
SODIUM: 142 mmol/L (ref 136–145)

## 2014-03-03 LAB — URINALYSIS, COMPLETE
BILIRUBIN, UR: NEGATIVE
Bacteria: NONE SEEN
Blood: NEGATIVE
Glucose,UR: NEGATIVE mg/dL (ref 0–75)
Ketone: NEGATIVE
Nitrite: NEGATIVE
Ph: 6 (ref 4.5–8.0)
Protein: NEGATIVE
RBC,UR: 1 /HPF (ref 0–5)
Specific Gravity: 1.009 (ref 1.003–1.030)
Squamous Epithelial: 8
WBC UR: 12 /HPF (ref 0–5)

## 2014-03-03 LAB — CBC
HCT: 27.6 % — ABNORMAL LOW (ref 35.0–47.0)
HGB: 8.8 g/dL — ABNORMAL LOW (ref 12.0–16.0)
MCH: 26.8 pg (ref 26.0–34.0)
MCHC: 31.8 g/dL — ABNORMAL LOW (ref 32.0–36.0)
MCV: 84 fL (ref 80–100)
Platelet: 283 10*3/uL (ref 150–440)
RBC: 3.28 10*6/uL — ABNORMAL LOW (ref 3.80–5.20)
RDW: 15.6 % — AB (ref 11.5–14.5)
WBC: 10.2 10*3/uL (ref 3.6–11.0)

## 2014-03-03 LAB — TYPE AND SCREEN
ABO/RH(D): B POS
Antibody Screen: NEGATIVE
Unit division: 0
Unit division: 0

## 2014-03-03 LAB — TROPONIN I

## 2014-03-03 LAB — HCG, QUANTITATIVE, PREGNANCY

## 2014-03-04 LAB — BASIC METABOLIC PANEL
Anion Gap: 8 (ref 7–16)
BUN: 9 mg/dL (ref 7–18)
CALCIUM: 8.1 mg/dL — AB (ref 8.5–10.1)
Chloride: 111 mmol/L — ABNORMAL HIGH (ref 98–107)
Co2: 22 mmol/L (ref 21–32)
Creatinine: 1.33 mg/dL — ABNORMAL HIGH (ref 0.60–1.30)
GFR CALC AF AMER: 59 — AB
GFR CALC NON AF AMER: 51 — AB
GLUCOSE: 191 mg/dL — AB (ref 65–99)
OSMOLALITY: 285 (ref 275–301)
POTASSIUM: 3.4 mmol/L — AB (ref 3.5–5.1)
Sodium: 141 mmol/L (ref 136–145)

## 2014-03-04 NOTE — ED Provider Notes (Signed)
Medical screening examination/treatment/procedure(s) were performed by non-physician practitioner and as supervising physician I was immediately available for consultation/collaboration.   EKG Interpretation None        Tanna Furry, MD 03/04/14 1026

## 2014-03-05 ENCOUNTER — Inpatient Hospital Stay: Payer: Medicare HMO | Admitting: Internal Medicine

## 2014-03-05 LAB — CREATININE, SERUM
Creatinine: 1.25 mg/dL (ref 0.60–1.30)
GFR CALC NON AF AMER: 55 — AB

## 2014-03-05 LAB — VANCOMYCIN, TROUGH: Vancomycin, Trough: 33 ug/mL (ref 10–20)

## 2014-03-06 LAB — CBC WITH DIFFERENTIAL/PLATELET
Basophil #: 0 10*3/uL (ref 0.0–0.1)
Basophil %: 0.5 %
EOS ABS: 0.4 10*3/uL (ref 0.0–0.7)
EOS PCT: 5 %
HCT: 22.7 % — AB (ref 35.0–47.0)
HGB: 7.2 g/dL — AB (ref 12.0–16.0)
LYMPHS PCT: 21.9 %
Lymphocyte #: 1.5 10*3/uL (ref 1.0–3.6)
MCH: 26.5 pg (ref 26.0–34.0)
MCHC: 31.8 g/dL — AB (ref 32.0–36.0)
MCV: 83 fL (ref 80–100)
Monocyte #: 0.5 x10 3/mm (ref 0.2–0.9)
Monocyte %: 6.6 %
NEUTROS PCT: 66 %
Neutrophil #: 4.7 10*3/uL (ref 1.4–6.5)
PLATELETS: 251 10*3/uL (ref 150–440)
RBC: 2.72 10*6/uL — AB (ref 3.80–5.20)
RDW: 16 % — ABNORMAL HIGH (ref 11.5–14.5)
WBC: 7 10*3/uL (ref 3.6–11.0)

## 2014-03-06 LAB — CREATININE, SERUM
Creatinine: 1.43 mg/dL — ABNORMAL HIGH (ref 0.60–1.30)
EGFR (Non-African Amer.): 46 — ABNORMAL LOW
GFR CALC AF AMER: 54 — AB

## 2014-03-06 LAB — VANCOMYCIN, TROUGH: VANCOMYCIN, TROUGH: 19 ug/mL (ref 10–20)

## 2014-03-06 LAB — OCCULT BLOOD X 1 CARD TO LAB, STOOL: Occult Blood, Feces: NEGATIVE

## 2014-03-07 LAB — BASIC METABOLIC PANEL
ANION GAP: 10 (ref 7–16)
BUN: 7 mg/dL (ref 7–18)
CHLORIDE: 110 mmol/L — AB (ref 98–107)
CO2: 24 mmol/L (ref 21–32)
Calcium, Total: 8.3 mg/dL — ABNORMAL LOW (ref 8.5–10.1)
Creatinine: 1.24 mg/dL (ref 0.60–1.30)
EGFR (African American): >60
EGFR (Non-African Amer.): 55 — ABNORMAL LOW
GLUCOSE: 131 mg/dL — AB (ref 65–99)
Osmolality: 287 (ref 275–301)
POTASSIUM: 2.9 mmol/L — AB (ref 3.5–5.1)
Sodium: 144 mmol/L (ref 136–145)

## 2014-03-07 LAB — CBC WITH DIFFERENTIAL/PLATELET
BASOS ABS: 0.1 10*3/uL (ref 0.0–0.1)
BASOS PCT: 0.9 %
EOS ABS: 0.4 10*3/uL (ref 0.0–0.7)
Eosinophil %: 5.4 %
HCT: 26.4 % — AB (ref 35.0–47.0)
HGB: 8.6 g/dL — ABNORMAL LOW (ref 12.0–16.0)
Lymphocyte #: 1.4 10*3/uL (ref 1.0–3.6)
Lymphocyte %: 19.7 %
MCH: 27.3 pg (ref 26.0–34.0)
MCHC: 32.6 g/dL (ref 32.0–36.0)
MCV: 84 fL (ref 80–100)
MONO ABS: 0.6 x10 3/mm (ref 0.2–0.9)
Monocyte %: 8.3 %
NEUTROS ABS: 4.5 10*3/uL (ref 1.4–6.5)
Neutrophil %: 65.7 %
Platelet: 286 10*3/uL (ref 150–440)
RBC: 3.15 10*6/uL — ABNORMAL LOW (ref 3.80–5.20)
RDW: 15.5 % — AB (ref 11.5–14.5)
WBC: 6.9 10*3/uL (ref 3.6–11.0)

## 2014-03-08 LAB — CULTURE, BLOOD (SINGLE)

## 2014-03-09 LAB — CULTURE, BLOOD (SINGLE)

## 2014-03-13 ENCOUNTER — Inpatient Hospital Stay: Payer: Self-pay | Admitting: Family Medicine

## 2014-03-13 LAB — CBC WITH DIFFERENTIAL/PLATELET
BASOS ABS: 0 10*3/uL (ref 0.0–0.1)
Basophil %: 0.5 %
EOS PCT: 2.4 %
Eosinophil #: 0.2 10*3/uL (ref 0.0–0.7)
HCT: 30.2 % — AB (ref 35.0–47.0)
HGB: 10.1 g/dL — AB (ref 12.0–16.0)
LYMPHS ABS: 1.7 10*3/uL (ref 1.0–3.6)
LYMPHS PCT: 19.2 %
MCH: 27.5 pg (ref 26.0–34.0)
MCHC: 33.3 g/dL (ref 32.0–36.0)
MCV: 82 fL (ref 80–100)
Monocyte #: 0.6 x10 3/mm (ref 0.2–0.9)
Monocyte %: 7 %
NEUTROS ABS: 6.2 10*3/uL (ref 1.4–6.5)
Neutrophil %: 70.9 %
PLATELETS: 241 10*3/uL (ref 150–440)
RBC: 3.67 10*6/uL — ABNORMAL LOW (ref 3.80–5.20)
RDW: 16.3 % — ABNORMAL HIGH (ref 11.5–14.5)
WBC: 8.8 10*3/uL (ref 3.6–11.0)

## 2014-03-13 LAB — URINALYSIS, COMPLETE
BILIRUBIN, UR: NEGATIVE
Blood: NEGATIVE
Glucose,UR: NEGATIVE mg/dL (ref 0–75)
Ketone: NEGATIVE
Leukocyte Esterase: NEGATIVE
Nitrite: NEGATIVE
Ph: 7 (ref 4.5–8.0)
Protein: NEGATIVE
SPECIFIC GRAVITY: 1.004 (ref 1.003–1.030)
Squamous Epithelial: 2
WBC UR: 1 /HPF (ref 0–5)

## 2014-03-13 LAB — COMPREHENSIVE METABOLIC PANEL
ALK PHOS: 89 U/L
ALT: 28 U/L
Albumin: 2.6 g/dL — ABNORMAL LOW (ref 3.4–5.0)
Anion Gap: 11 (ref 7–16)
BUN: 6 mg/dL — ABNORMAL LOW (ref 7–18)
Bilirubin,Total: 0.5 mg/dL (ref 0.2–1.0)
CHLORIDE: 109 mmol/L — AB (ref 98–107)
Calcium, Total: 8.7 mg/dL (ref 8.5–10.1)
Co2: 24 mmol/L (ref 21–32)
Creatinine: 0.96 mg/dL (ref 0.60–1.30)
EGFR (Non-African Amer.): >60
GLUCOSE: 140 mg/dL — AB (ref 65–99)
Osmolality: 287 (ref 275–301)
Potassium: 2.8 mmol/L — ABNORMAL LOW (ref 3.5–5.1)
SGOT(AST): 43 U/L — ABNORMAL HIGH (ref 15–37)
Sodium: 144 mmol/L (ref 136–145)
Total Protein: 7.2 g/dL (ref 6.4–8.2)

## 2014-03-13 LAB — VANCOMYCIN, RANDOM: Vancomycin, Random: 25 ug/mL

## 2014-03-13 LAB — MAGNESIUM: Magnesium: 1.6 mg/dL — ABNORMAL LOW

## 2014-03-13 LAB — PRO B NATRIURETIC PEPTIDE: B-TYPE NATIURETIC PEPTID: 10096 pg/mL — AB (ref 0–125)

## 2014-03-13 LAB — TROPONIN I: Troponin-I: <0.02 ng/mL

## 2014-03-14 LAB — BASIC METABOLIC PANEL
ANION GAP: 8 (ref 7–16)
BUN: 4 mg/dL — AB (ref 7–18)
CREATININE: 1.07 mg/dL (ref 0.60–1.30)
Calcium, Total: 8 mg/dL — ABNORMAL LOW (ref 8.5–10.1)
Chloride: 109 mmol/L — ABNORMAL HIGH (ref 98–107)
Co2: 27 mmol/L (ref 21–32)
EGFR (African American): >60
GLUCOSE: 160 mg/dL — AB (ref 65–99)
Osmolality: 287 (ref 275–301)
Potassium: 3.2 mmol/L — ABNORMAL LOW (ref 3.5–5.1)
SODIUM: 144 mmol/L (ref 136–145)

## 2014-03-14 LAB — CBC WITH DIFFERENTIAL/PLATELET
BASOS ABS: 0 10*3/uL (ref 0.0–0.1)
Basophil %: 0.5 %
Eosinophil #: 0.2 10*3/uL (ref 0.0–0.7)
Eosinophil %: 4.3 %
HCT: 29.3 % — ABNORMAL LOW (ref 35.0–47.0)
HGB: 9.3 g/dL — ABNORMAL LOW (ref 12.0–16.0)
LYMPHS PCT: 26.5 %
Lymphocyte #: 1.5 10*3/uL (ref 1.0–3.6)
MCH: 26.9 pg (ref 26.0–34.0)
MCHC: 31.8 g/dL — ABNORMAL LOW (ref 32.0–36.0)
MCV: 85 fL (ref 80–100)
MONOS PCT: 9.2 %
Monocyte #: 0.5 x10 3/mm (ref 0.2–0.9)
Neutrophil #: 3.3 10*3/uL (ref 1.4–6.5)
Neutrophil %: 59.5 %
Platelet: 198 10*3/uL (ref 150–440)
RBC: 3.46 10*6/uL — ABNORMAL LOW (ref 3.80–5.20)
RDW: 16.3 % — ABNORMAL HIGH (ref 11.5–14.5)
WBC: 5.6 10*3/uL (ref 3.6–11.0)

## 2014-03-14 LAB — VANCOMYCIN, TROUGH: VANCOMYCIN, TROUGH: 13 ug/mL (ref 10–20)

## 2014-03-15 LAB — CBC WITH DIFFERENTIAL/PLATELET
BASOS PCT: 0.8 %
Basophil #: 0 10*3/uL (ref 0.0–0.1)
Eosinophil #: 0.3 10*3/uL (ref 0.0–0.7)
Eosinophil %: 6.6 %
HCT: 25.9 % — AB (ref 35.0–47.0)
HGB: 8.5 g/dL — ABNORMAL LOW (ref 12.0–16.0)
LYMPHS ABS: 1.4 10*3/uL (ref 1.0–3.6)
Lymphocyte %: 28.8 %
MCH: 27.7 pg (ref 26.0–34.0)
MCHC: 32.9 g/dL (ref 32.0–36.0)
MCV: 84 fL (ref 80–100)
Monocyte #: 0.5 x10 3/mm (ref 0.2–0.9)
Monocyte %: 9.6 %
NEUTROS ABS: 2.6 10*3/uL (ref 1.4–6.5)
Neutrophil %: 54.2 %
Platelet: 156 10*3/uL (ref 150–440)
RBC: 3.08 10*6/uL — AB (ref 3.80–5.20)
RDW: 16.4 % — ABNORMAL HIGH (ref 11.5–14.5)
WBC: 4.8 10*3/uL (ref 3.6–11.0)

## 2014-03-15 LAB — COMPREHENSIVE METABOLIC PANEL
ALK PHOS: 75 U/L
ALT: 26 U/L
ANION GAP: 6 — AB (ref 7–16)
Albumin: 2.3 g/dL — ABNORMAL LOW (ref 3.4–5.0)
BILIRUBIN TOTAL: 0.3 mg/dL (ref 0.2–1.0)
BUN: 6 mg/dL — ABNORMAL LOW (ref 7–18)
CHLORIDE: 111 mmol/L — AB (ref 98–107)
CO2: 26 mmol/L (ref 21–32)
CREATININE: 1.27 mg/dL (ref 0.60–1.30)
Calcium, Total: 8 mg/dL — ABNORMAL LOW (ref 8.5–10.1)
EGFR (African American): >60
EGFR (Non-African Amer.): 53 — ABNORMAL LOW
GLUCOSE: 148 mg/dL — AB (ref 65–99)
OSMOLALITY: 285 (ref 275–301)
Potassium: 4 mmol/L (ref 3.5–5.1)
SGOT(AST): 41 U/L — ABNORMAL HIGH (ref 15–37)
Sodium: 143 mmol/L (ref 136–145)
Total Protein: 6.5 g/dL (ref 6.4–8.2)

## 2014-03-15 LAB — VANCOMYCIN, TROUGH: VANCOMYCIN, TROUGH: 23 ug/mL — AB (ref 10–20)

## 2014-03-15 LAB — MAGNESIUM: Magnesium: 1.6 mg/dL — ABNORMAL LOW

## 2014-03-16 LAB — MAGNESIUM: Magnesium: 1.6 mg/dL — ABNORMAL LOW

## 2014-03-16 LAB — CBC WITH DIFFERENTIAL/PLATELET
BASOS ABS: 0 10*3/uL (ref 0.0–0.1)
Basophil %: 0.6 %
EOS PCT: 2.5 %
Eosinophil #: 0.1 10*3/uL (ref 0.0–0.7)
HCT: 26.4 % — ABNORMAL LOW (ref 35.0–47.0)
HGB: 8.4 g/dL — ABNORMAL LOW (ref 12.0–16.0)
LYMPHS PCT: 28.7 %
Lymphocyte #: 1.7 10*3/uL (ref 1.0–3.6)
MCH: 27.1 pg (ref 26.0–34.0)
MCHC: 31.8 g/dL — ABNORMAL LOW (ref 32.0–36.0)
MCV: 85 fL (ref 80–100)
MONO ABS: 0.4 x10 3/mm (ref 0.2–0.9)
MONOS PCT: 6.4 %
Neutrophil #: 3.6 10*3/uL (ref 1.4–6.5)
Neutrophil %: 61.8 %
Platelet: 155 10*3/uL (ref 150–440)
RBC: 3.09 10*6/uL — AB (ref 3.80–5.20)
RDW: 16.3 % — AB (ref 11.5–14.5)
WBC: 5.9 10*3/uL (ref 3.6–11.0)

## 2014-03-16 LAB — COMPREHENSIVE METABOLIC PANEL
ALBUMIN: 2.3 g/dL — AB (ref 3.4–5.0)
ALT: 33 U/L
ANION GAP: 10 (ref 7–16)
Alkaline Phosphatase: 111 U/L
BUN: 12 mg/dL (ref 7–18)
Bilirubin,Total: 0.3 mg/dL (ref 0.2–1.0)
CALCIUM: 8.2 mg/dL — AB (ref 8.5–10.1)
CO2: 22 mmol/L (ref 21–32)
Chloride: 110 mmol/L — ABNORMAL HIGH (ref 98–107)
Creatinine: 1.43 mg/dL — ABNORMAL HIGH (ref 0.60–1.30)
EGFR (African American): 54 — ABNORMAL LOW
GFR CALC NON AF AMER: 46 — AB
GLUCOSE: 201 mg/dL — AB (ref 65–99)
Osmolality: 289 (ref 275–301)
POTASSIUM: 4.9 mmol/L (ref 3.5–5.1)
SGOT(AST): 72 U/L — ABNORMAL HIGH (ref 15–37)
Sodium: 142 mmol/L (ref 136–145)
Total Protein: 6.5 g/dL (ref 6.4–8.2)

## 2014-03-17 LAB — CBC WITH DIFFERENTIAL/PLATELET
BASOS PCT: 0.7 %
Basophil #: 0 10*3/uL (ref 0.0–0.1)
EOS PCT: 2.4 %
Eosinophil #: 0.1 10*3/uL (ref 0.0–0.7)
HCT: 26.5 % — AB (ref 35.0–47.0)
HGB: 8.4 g/dL — ABNORMAL LOW (ref 12.0–16.0)
LYMPHS ABS: 2.2 10*3/uL (ref 1.0–3.6)
LYMPHS PCT: 36.1 %
MCH: 27.1 pg (ref 26.0–34.0)
MCHC: 31.6 g/dL — ABNORMAL LOW (ref 32.0–36.0)
MCV: 86 fL (ref 80–100)
MONO ABS: 0.4 x10 3/mm (ref 0.2–0.9)
Monocyte %: 7 %
Neutrophil #: 3.2 10*3/uL (ref 1.4–6.5)
Neutrophil %: 53.8 %
Platelet: 163 10*3/uL (ref 150–440)
RBC: 3.09 10*6/uL — ABNORMAL LOW (ref 3.80–5.20)
RDW: 16.7 % — ABNORMAL HIGH (ref 11.5–14.5)
WBC: 6 10*3/uL (ref 3.6–11.0)

## 2014-03-17 LAB — BASIC METABOLIC PANEL
ANION GAP: 13 (ref 7–16)
BUN: 12 mg/dL (ref 7–18)
CALCIUM: 8.5 mg/dL (ref 8.5–10.1)
CHLORIDE: 109 mmol/L — AB (ref 98–107)
CO2: 21 mmol/L (ref 21–32)
Creatinine: 1.48 mg/dL — ABNORMAL HIGH (ref 0.60–1.30)
GFR CALC AF AMER: 52 — AB
GFR CALC NON AF AMER: 44 — AB
Glucose: 167 mg/dL — ABNORMAL HIGH (ref 65–99)
OSMOLALITY: 289 (ref 275–301)
POTASSIUM: 4.3 mmol/L (ref 3.5–5.1)
Sodium: 143 mmol/L (ref 136–145)

## 2014-03-18 LAB — CREATININE, SERUM
Creatinine: 1.38 mg/dL — ABNORMAL HIGH (ref 0.60–1.30)
EGFR (African American): 56 — ABNORMAL LOW
GFR CALC NON AF AMER: 48 — AB

## 2014-03-18 LAB — VANCOMYCIN, TROUGH: VANCOMYCIN, TROUGH: 18 ug/mL (ref 10–20)

## 2014-03-18 LAB — CULTURE, BLOOD (SINGLE)

## 2014-03-19 LAB — BASIC METABOLIC PANEL
ANION GAP: 7 (ref 7–16)
BUN: 11 mg/dL (ref 7–18)
CALCIUM: 8.9 mg/dL (ref 8.5–10.1)
CHLORIDE: 109 mmol/L — AB (ref 98–107)
Co2: 25 mmol/L (ref 21–32)
Creatinine: 1.2 mg/dL (ref 0.60–1.30)
EGFR (African American): >60
EGFR (Non-African Amer.): 57 — ABNORMAL LOW
GLUCOSE: 228 mg/dL — AB (ref 65–99)
Osmolality: 288 (ref 275–301)
Potassium: 3.6 mmol/L (ref 3.5–5.1)
SODIUM: 141 mmol/L (ref 136–145)

## 2014-03-19 LAB — CK TOTAL AND CKMB (NOT AT ARMC)
CK, Total: 29 U/L
CK-MB: 0.9 ng/mL (ref 0.5–3.6)

## 2014-03-19 LAB — TROPONIN I
Troponin-I: <0.02 ng/mL
Troponin-I: <0.02 ng/mL

## 2014-03-20 LAB — CBC WITH DIFFERENTIAL/PLATELET
BASOS PCT: 0.6 %
Basophil #: 0 10*3/uL (ref 0.0–0.1)
Eosinophil #: 0.3 10*3/uL (ref 0.0–0.7)
Eosinophil %: 5 %
HCT: 29 % — ABNORMAL LOW (ref 35.0–47.0)
HGB: 9.1 g/dL — AB (ref 12.0–16.0)
LYMPHS PCT: 20.9 %
Lymphocyte #: 1.4 10*3/uL (ref 1.0–3.6)
MCH: 26.4 pg (ref 26.0–34.0)
MCHC: 31.3 g/dL — ABNORMAL LOW (ref 32.0–36.0)
MCV: 84 fL (ref 80–100)
Monocyte #: 0.6 x10 3/mm (ref 0.2–0.9)
Monocyte %: 8.6 %
NEUTROS ABS: 4.3 10*3/uL (ref 1.4–6.5)
NEUTROS PCT: 64.9 %
Platelet: 177 10*3/uL (ref 150–440)
RBC: 3.45 10*6/uL — AB (ref 3.80–5.20)
RDW: 16.6 % — AB (ref 11.5–14.5)
WBC: 6.6 10*3/uL (ref 3.6–11.0)

## 2014-03-20 LAB — BASIC METABOLIC PANEL
Anion Gap: 11 (ref 7–16)
BUN: 9 mg/dL (ref 7–18)
Calcium, Total: 8.7 mg/dL (ref 8.5–10.1)
Chloride: 104 mmol/L (ref 98–107)
Co2: 28 mmol/L (ref 21–32)
Creatinine: 1.18 mg/dL (ref 0.60–1.30)
GFR CALC NON AF AMER: 58 — AB
Glucose: 220 mg/dL — ABNORMAL HIGH (ref 65–99)
OSMOLALITY: 290 (ref 275–301)
Potassium: 3.3 mmol/L — ABNORMAL LOW (ref 3.5–5.1)
Sodium: 143 mmol/L (ref 136–145)

## 2014-03-20 LAB — TROPONIN I

## 2014-03-20 LAB — VANCOMYCIN, TROUGH: Vancomycin, Trough: 30 ug/mL (ref 10–20)

## 2014-03-24 DIAGNOSIS — R0681 Apnea, not elsewhere classified: Secondary | ICD-10-CM | POA: Insufficient documentation

## 2014-03-25 LAB — FUNGUS CULTURE W SMEAR
FUNGAL SMEAR: NONE SEEN
Fungal Smear: NONE SEEN
Fungal Smear: NONE SEEN
Fungal Smear: NONE SEEN

## 2014-03-31 ENCOUNTER — Ambulatory Visit: Payer: Self-pay | Admitting: Family Medicine

## 2014-04-07 ENCOUNTER — Encounter: Payer: Self-pay | Admitting: Internal Medicine

## 2014-04-07 ENCOUNTER — Ambulatory Visit (INDEPENDENT_AMBULATORY_CARE_PROVIDER_SITE_OTHER): Payer: Commercial Managed Care - HMO | Admitting: Internal Medicine

## 2014-04-07 VITALS — BP 198/128 | HR 127 | Temp 98.2°F | Wt 225.0 lb

## 2014-04-07 DIAGNOSIS — T889XXS Complication of surgical and medical care, unspecified, sequela: Secondary | ICD-10-CM

## 2014-04-07 DIAGNOSIS — IMO0001 Reserved for inherently not codable concepts without codable children: Secondary | ICD-10-CM

## 2014-04-07 DIAGNOSIS — R6520 Severe sepsis without septic shock: Principal | ICD-10-CM

## 2014-04-07 DIAGNOSIS — A419 Sepsis, unspecified organism: Secondary | ICD-10-CM

## 2014-04-07 DIAGNOSIS — T814XXS Infection following a procedure, sequela: Secondary | ICD-10-CM

## 2014-04-07 MED ORDER — SULFAMETHOXAZOLE-TMP DS 800-160 MG PO TABS: 1.0000 | ORAL_TABLET | Freq: Two times a day (BID) | ORAL | Status: DC

## 2014-04-07 NOTE — Progress Notes (Signed)
Subjective:    Patient ID: Marissa [name], female    DOB: 1975-12-14, 38 y.o.   MRN: [id number]  HPI 38 yo F with wound infection complicating hardware. She initially was hospitalized in mid June for group a strep sepsis, with her OR cultures growing enterobacter & group A strep from wound. She was discharged on ceftriaxone but had wound dehiscence and required to haved 2nd debridement on July 15th. Her OR cultures did not show any new pathogen, but her antibiotic regimen was changed to include ceftriaxone plus vancomycin due to concern for skin flora contributing to ongoing wound infection. She has now finished 6 wks of IV vancomycin plus ceftriaxone and awaiting transition to oral antibiotics. She states she has not had difficulty with her picc line but over the last 4 days has had increasingly elevated bp with sBP 180-200 and dBP in 120-130s. She denies any headache, dizziness.   All :cipro -angio edema Azithromycin -intense itchy rash  Current Outpatient Prescriptions on File Prior to Visit  Medication Sig Dispense Refill  . amitriptyline (ELAVIL) 100 MG tablet Take 100 mg by mouth at bedtime.       . carvedilol (COREG) 25 MG tablet Take 1 tablet (25 mg total) by mouth 2 (two) times daily with a meal.  60 tablet  2  . cefTRIAXone 2 g in dextrose 5 % 50 mL Inject 2 g into the vein daily.  48 g  6  . gabapentin (NEURONTIN) 300 MG capsule Take 600 mg by mouth 3 (three) times daily.       . insulin aspart (NOVOLOG FLEXPEN) 100 UNIT/ML FlexPen Inject 10-20 Units into the skin 3 (three) times daily with meals. Sliding scale  15 mL  11  . Liraglutide (VICTOZA) 18 MG/3ML SOPN Inject 1.8 mg into the skin daily.      . metoCLOPramide (REGLAN) 10 MG tablet Take 1 tablet (10 mg total) by mouth every 6 (six) hours as needed for nausea, vomiting or refractory nausea / vomiting.  30 tablet  0  . nitroGLYCERIN (NITROSTAT) 0.4 MG SL tablet Place 0.4 mg under the tongue every 5 (five) minutes as needed for  chest pain.      Marland Kitchen oxyCODONE (OXY IR/ROXICODONE) 5 MG immediate release tablet Take 1 tablet (5 mg total) by mouth every 4 (four) hours as needed for moderate pain.  90 tablet  0  . simvastatin (ZOCOR) 40 MG tablet Take 40 mg by mouth daily.      . SUMAtriptan (IMITREX) 50 MG tablet Take 50 mg by mouth every 2 (two) hours as needed for migraine or headache. May repeat in 2 hours if headache persists or recurs.      Marland Kitchen tiZANidine (ZANAFLEX) 4 MG tablet Take 1 tablet (4 mg total) by mouth 3 (three) times daily.  60 tablet  1  . topiramate (TOPAMAX) 100 MG tablet Take 100 mg by mouth at bedtime. Take with 25 mg tablet for a 125 mg dose      . topiramate (TOPAMAX) 25 MG tablet Take 25 mg by mouth at bedtime. Take with 100 mg tablet for a 125 mg dose      . vancomycin (VANCOCIN) 1 GM/200ML SOLN Inject 200 mLs (1,000 mg total) into the vein every 12 (twelve) hours.  4000 mL  6   No current facility-administered medications on file prior to visit.   Active Ambulatory Problems    Diagnosis Date Noted  . HNP (herniated nucleus pulposus), lumbar 10/29/2013  .  Back pain 11/01/2013  . Wound infection 01/25/2014  . Acute respiratory failure with hypoxia 01/25/2014  . Severe sepsis(995.92) 01/30/2014  . History of lumbar laminectomy 01/30/2014  . Acute renal failure 01/30/2014  . Obesity 01/30/2014  . Anemia 01/30/2014  . HTN (hypertension) 01/30/2014  . DM (diabetes mellitus), type 2 with renal complications 16/38/4665  . Nausea alone 02/01/2014  . Abdominal pain, unspecified site 02/01/2014  . Wound infection complicating hardware 99/35/7017   Resolved Ambulatory Problems    Diagnosis Date Noted  . No Resolved Ambulatory Problems   Past Medical History  Diagnosis Date  . Hypertension   . Coronary artery disease   . Neuropathy   . Headache(784.0)   . Arthritis   . Vision loss   . Myocardial infarction 2006  . Dysrhythmia   . Diabetes mellitus without complication   . GERD  (gastroesophageal reflux disease)   . Migraines   . Hyperlipemia       Review of Systems     Objective:   Physical Exam  BP 198/128  Pulse 127  Temp(Src) 98.2 F (36.8 C) (Oral)  Wt 225 lb (102.059 kg). Physical Exam  Constitutional:  oriented to person, place, and time. appears well-developed and well-nourished. No distress.  HENT:  Mouth/Throat: Oropharynx is clear and moist. No oropharyngeal exudate.  Cardiovascular: Normal rate, regular rhythm and normal heart sounds. Exam reveals no gallop and no friction rub.  No murmur heard.  Pulmonary/Chest: Effort normal and breath sounds normal. No respiratory distress.  has no wheezes.  Abdominal: Soft. Bowel sounds are normal.  exhibits no distension. There is no tenderness.  Lymphadenopathy: no cervical adenopathy.  Neurological: alert and oriented to person, place, and time.  Skin: Skin is warm and dry. Back surgical site well healed. Keloid scarring at suture incision site. No fluctuance or erythema or warmth. She does have hyperpigmentation to the area Psychiatric: a normal mood and affect. behavior is normal.   Lab Results  Component Value Date   CRP 2.4* 04/07/2014   Lab Results  Component Value Date   ESRSEDRATE 84* 04/07/2014        Assessment & Plan:  Polymicrobial wound infection complicating hardware = we will transition her to bactrim ds 1 tab BID. Will check bmp, sed rate, crp. Still markedly elevated. Will likely need prolonged oral suppressive therapy. Consider repeat imaging in 4- 6wks  HTN = will ask her to follow up with her PCP tomorrow to adjust meds. Asked her to increase her carvedilol dose for next 2 dose til she sees her PCP.  rtc in 4 wk

## 2014-04-08 LAB — C-REACTIVE PROTEIN: CRP: 2.4 mg/dL — ABNORMAL HIGH (ref <0.60)

## 2014-04-08 LAB — BASIC METABOLIC PANEL
BUN: 7 mg/dL (ref 6–23)
CALCIUM: 9.6 mg/dL (ref 8.4–10.5)
CO2: 27 mEq/L (ref 19–32)
Chloride: 104 mEq/L (ref 96–112)
Creat: 0.76 mg/dL (ref 0.50–1.10)
GLUCOSE: 129 mg/dL — AB (ref 70–99)
Potassium: 3.7 mEq/L (ref 3.5–5.3)
SODIUM: 143 meq/L (ref 135–145)

## 2014-04-08 LAB — SEDIMENTATION RATE: Sed Rate: 84 mm/hr — ABNORMAL HIGH (ref 0–22)

## 2014-04-08 NOTE — Progress Notes (Signed)
04-07-14 Per Dr Baxter Flattery  44 cm Single lumen Peripherally Inserted Central Catheter  removed from left  basilic . No sutures present. Dressing was clean and dry . Area cleansed with chlorhexidine and petroleum dressing applied. Pt advised no heavy lifting with this arm, leave dressing for 24 hours and call the office if dressing becomes soaked with blood or sharp pain presents.  Pt complained of upper arm pain with some numbness after PICC removal.  This was evaluated by Dr Baxter Flattery prior to her release.  Dressing removed and reapplied loosely and patient statesed it felt much better.   Laverle Patter, RN

## 2014-04-09 ENCOUNTER — Telehealth: Payer: Self-pay | Admitting: *Deleted

## 2014-04-09 NOTE — Telephone Encounter (Signed)
Requesting information about lab results.   Unable to leave message, Mailbox full.

## 2014-04-15 DIAGNOSIS — E669 Obesity, unspecified: Secondary | ICD-10-CM | POA: Insufficient documentation

## 2014-04-15 DIAGNOSIS — I2510 Atherosclerotic heart disease of native coronary artery without angina pectoris: Secondary | ICD-10-CM | POA: Insufficient documentation

## 2014-04-29 ENCOUNTER — Ambulatory Visit: Payer: Self-pay | Admitting: Pain Medicine

## 2014-05-06 ENCOUNTER — Ambulatory Visit (INDEPENDENT_AMBULATORY_CARE_PROVIDER_SITE_OTHER): Payer: Commercial Managed Care - HMO | Admitting: Internal Medicine

## 2014-05-06 ENCOUNTER — Encounter: Payer: Self-pay | Admitting: Internal Medicine

## 2014-05-06 ENCOUNTER — Telehealth: Payer: Self-pay | Admitting: *Deleted

## 2014-05-06 VITALS — BP 170/123 | HR 103 | Temp 98.2°F | Ht 65.0 in | Wt 224.5 lb

## 2014-05-06 DIAGNOSIS — R110 Nausea: Secondary | ICD-10-CM

## 2014-05-06 DIAGNOSIS — M4646 Discitis, unspecified, lumbar region: Secondary | ICD-10-CM

## 2014-05-06 DIAGNOSIS — Z0000 Encounter for general adult medical examination without abnormal findings: Secondary | ICD-10-CM

## 2014-05-06 DIAGNOSIS — M519 Unspecified thoracic, thoracolumbar and lumbosacral intervertebral disc disorder: Secondary | ICD-10-CM

## 2014-05-06 LAB — BASIC METABOLIC PANEL
BUN: 8 mg/dL (ref 6–23)
CO2: 25 mEq/L (ref 19–32)
Calcium: 9.4 mg/dL (ref 8.4–10.5)
Chloride: 109 mEq/L (ref 96–112)
Creat: 0.76 mg/dL (ref 0.50–1.10)
Glucose, Bld: 134 mg/dL — ABNORMAL HIGH (ref 70–99)
POTASSIUM: 4.6 meq/L (ref 3.5–5.3)
SODIUM: 142 meq/L (ref 135–145)

## 2014-05-06 LAB — CBC WITH DIFFERENTIAL/PLATELET
Basophils Absolute: 0 10*3/uL (ref 0.0–0.1)
Basophils Relative: 0 % (ref 0–1)
EOS PCT: 1 % (ref 0–5)
Eosinophils Absolute: 0.1 10*3/uL (ref 0.0–0.7)
HCT: 34.6 % — ABNORMAL LOW (ref 36.0–46.0)
Hemoglobin: 11.2 g/dL — ABNORMAL LOW (ref 12.0–15.0)
LYMPHS PCT: 28 % (ref 12–46)
Lymphs Abs: 1.5 10*3/uL (ref 0.7–4.0)
MCH: 26.4 pg (ref 26.0–34.0)
MCHC: 32.4 g/dL (ref 30.0–36.0)
MCV: 81.4 fL (ref 78.0–100.0)
Monocytes Absolute: 0.4 10*3/uL (ref 0.1–1.0)
Monocytes Relative: 8 % (ref 3–12)
NEUTROS PCT: 63 % (ref 43–77)
Neutro Abs: 3.4 10*3/uL (ref 1.7–7.7)
PLATELETS: 216 10*3/uL (ref 150–400)
RBC: 4.25 MIL/uL (ref 3.87–5.11)
RDW: 16.7 % — ABNORMAL HIGH (ref 11.5–15.5)
WBC: 5.4 10*3/uL (ref 4.0–10.5)

## 2014-05-06 LAB — C-REACTIVE PROTEIN

## 2014-05-06 MED ORDER — PROCHLORPERAZINE MALEATE 10 MG PO TABS: 10.0000 mg | ORAL_TABLET | Freq: Three times a day (TID) | ORAL | Status: DC | PRN

## 2014-05-06 NOTE — Telephone Encounter (Signed)
Requested x-ray report from [place] 2015 be faxed to [facility] per Dr. Baxter Flattery.

## 2014-05-06 NOTE — Progress Notes (Signed)
Subjective:    Patient ID: Marissa [name], female    DOB: 1975/12/30, 38 y.o.   MRN: [id number]  HPI Marissa [name] is a 38 y.o. female with HTN ,DM, obesity s/p redo decompressive lumbar laminectomy (L5-S1) and posterior lumbar interbody fusion on 01/16/14 presents with sepsis from group a strep bacteremia and post-op wound with group a & enterobacter s/p irrigation and debridement of superficial and deep wound space infection on .Marland Kitchen She was discharged on 6 wk of IV antibiotics ceftriaxone, but repeat MRI scan still showed ring-enhancing lesions one deep tissue and the epidural space as well as fairly large the epidural fluid collection is compressive causes her thecal sac stenosis and due to her persistent draining wound felt that reexploration washout of her lumbar wound would be beneficial. Thus she had her 2nd I x D on 7/15. Repeat cx had no organism. Antibiotics changed to vancomycin and ceftriaxone.  She  was transitioned to bactrim which she is somewhat tolerating but has to take anti emetics to continue to take antibiotics.  Back pain is manageable. No fever or chills, diarrhea, or rash. She recently had imaging of spine roughly 4 wks ago.   Current Outpatient Prescriptions on File Prior to Visit  Medication Sig Dispense Refill  . amitriptyline (ELAVIL) 100 MG tablet Take 100 mg by mouth at bedtime.       . carvedilol (COREG) 25 MG tablet Take 1 tablet (25 mg total) by mouth 2 (two) times daily with a meal.  60 tablet  2  . cefTRIAXone 2 g in dextrose 5 % 50 mL Inject 2 g into the vein daily.  48 g  6  . gabapentin (NEURONTIN) 300 MG capsule Take 600 mg by mouth 3 (three) times daily.       . insulin aspart (NOVOLOG FLEXPEN) 100 UNIT/ML FlexPen Inject 10-20 Units into the skin 3 (three) times daily with meals. Sliding scale  15 mL  11  . Liraglutide (VICTOZA) 18 MG/3ML SOPN Inject 1.8 mg into the skin daily.      . metoCLOPramide (REGLAN) 10 MG tablet Take 1 tablet (10 mg total) by mouth  every 6 (six) hours as needed for nausea, vomiting or refractory nausea / vomiting.  30 tablet  0  . nitroGLYCERIN (NITROSTAT) 0.4 MG SL tablet Place 0.4 mg under the tongue every 5 (five) minutes as needed for chest pain.      Marland Kitchen oxyCODONE (OXY IR/ROXICODONE) 5 MG immediate release tablet Take 1 tablet (5 mg total) by mouth every 4 (four) hours as needed for moderate pain.  90 tablet  0  . simvastatin (ZOCOR) 40 MG tablet Take 40 mg by mouth daily.      Marland Kitchen sulfamethoxazole-trimethoprim (BACTRIM DS) 800-160 MG per tablet Take 1 tablet by mouth 2 (two) times daily.  60 tablet  2  . SUMAtriptan (IMITREX) 50 MG tablet Take 50 mg by mouth every 2 (two) hours as needed for migraine or headache. May repeat in 2 hours if headache persists or recurs.      Marland Kitchen tiZANidine (ZANAFLEX) 4 MG tablet Take 1 tablet (4 mg total) by mouth 3 (three) times daily.  60 tablet  1  . topiramate (TOPAMAX) 100 MG tablet Take 100 mg by mouth at bedtime. Take with 25 mg tablet for a 125 mg dose      . topiramate (TOPAMAX) 25 MG tablet Take 25 mg by mouth at bedtime. Take with 100 mg tablet for a 125 mg dose      .  vancomycin (VANCOCIN) 1 GM/200ML SOLN Inject 200 mLs (1,000 mg total) into the vein every 12 (twelve) hours.  4000 mL  6   No current facility-administered medications on file prior to visit.      Review of Systems     Objective:   Physical Exam  BP 170/123  Pulse 103  Temp(Src) 98.2 F (36.8 C) (Oral)  Ht 5\' 5"  (1.651 m)  Wt 224 lb 8 oz (101.833 kg)  BMI 37.36 kg/m2 Skin = back incision well healed  Labs: Lab Results  Component Value Date   ESRSEDRATE 84* 04/07/2014   Lab Results  Component Value Date   CRP <0.5 05/06/2014        Assessment & Plan:   Enterobacter and group a strep = bactrim ds bid. Will check sed rate and crp. If normalized, can stop bactrim but anticipate she will likely need 4-6wk  Anti nausea = will try compazine  Health maintenance = declined flu shot

## 2014-05-07 LAB — SEDIMENTATION RATE: Sed Rate: 53 mm/hr — ABNORMAL HIGH (ref 0–22)

## 2014-05-08 ENCOUNTER — Telehealth: Payer: Self-pay | Admitting: *Deleted

## 2014-05-08 NOTE — Telephone Encounter (Signed)
Patient called to get the results of her labe work from 05/06/14. Advised her I will let the doctor and call her back with a response.

## 2014-05-08 NOTE — Telephone Encounter (Signed)
Spoke to her.

## 2014-05-27 ENCOUNTER — Ambulatory Visit: Payer: Self-pay | Admitting: Pain Medicine

## 2014-06-02 ENCOUNTER — Encounter: Payer: Self-pay | Admitting: Family Medicine

## 2014-06-14 ENCOUNTER — Encounter: Payer: Self-pay | Admitting: Family Medicine

## 2014-06-17 ENCOUNTER — Encounter: Payer: Self-pay | Admitting: Internal Medicine

## 2014-06-17 ENCOUNTER — Ambulatory Visit (INDEPENDENT_AMBULATORY_CARE_PROVIDER_SITE_OTHER): Payer: Commercial Managed Care - HMO | Admitting: Internal Medicine

## 2014-06-17 VITALS — BP 165/113 | HR 73 | Temp 97.6°F | Wt 228.0 lb

## 2014-06-17 DIAGNOSIS — M4620 Osteomyelitis of vertebra, site unspecified: Secondary | ICD-10-CM

## 2014-06-17 DIAGNOSIS — B3731 Acute candidiasis of vulva and vagina: Secondary | ICD-10-CM

## 2014-06-17 DIAGNOSIS — B373 Candidiasis of vulva and vagina: Secondary | ICD-10-CM

## 2014-06-17 LAB — BASIC METABOLIC PANEL
BUN: 9 mg/dL (ref 6–23)
CO2: 25 mEq/L (ref 19–32)
Calcium: 9.7 mg/dL (ref 8.4–10.5)
Chloride: 104 mEq/L (ref 96–112)
Creat: 0.71 mg/dL (ref 0.50–1.10)
Glucose, Bld: 118 mg/dL — ABNORMAL HIGH (ref 70–99)
POTASSIUM: 4.2 meq/L (ref 3.5–5.3)
SODIUM: 141 meq/L (ref 135–145)

## 2014-06-17 LAB — C-REACTIVE PROTEIN: CRP: 2.3 mg/dL — AB (ref <0.60)

## 2014-06-17 MED ORDER — FLUCONAZOLE 150 MG PO TABS: 150.0000 mg | ORAL_TABLET | Freq: Every day | ORAL | Status: DC

## 2014-06-17 NOTE — Progress Notes (Signed)
   Subjective:    Patient ID: Marissa [name], female    DOB: [birth year], 38 y.o.   MRN: [id number]  HPI Gabrille Kilbride is a 38 y.o. female with HTN ,DM, obesity s/p redo decompressive lumbar laminectomy (L5-S1) and posterior lumbar interbody fusion on 01/16/14 presents with sepsis from group a strep bacteremia and post-op wound with group a & enterobacter s/p irrigation and debridement of superficial and deep wound space infection on . She was discharged on 6 wk of IV antibiotics ceftriaxone, but repeat MRI scan still showed ring-enhancing lesions one deep tissue and the epidural space as well as fairly large the epidural fluid collection is compressive causes her thecal sac stenosis and due to her persistent draining wound felt that reexploration washout of her lumbar wound would be beneficial. Thus she had her 2nd I x D on 7/15.  Antibiotics changed to vancomycin and ceftriaxone for 6 wks, then in early september transitioned to bactrim which causes her to have nausea/vomiting. She takes various anti-emetics to minimize symptoms. She was seen in late September where inflammatory markers were still elevated. She is looking forward to stopping her antibiotics  Lab Results  Component Value Date   ESRSEDRATE 53* 05/06/2014   Lab Results  Component Value Date   CRP <0.5 05/06/2014      Review of Systems Back pain, nausea associated with abtx. Vaginal yeast discharge.10 point otherwise negative    Objective:   Physical Exam BP 165/113 mmHg  Pulse 73  Temp(Src) 97.6 F (36.4 C) (Oral)  Wt 228 lb (103.42 kg) Physical Exam  Constitutional:  oriented to person, place, and time. appears well-developed and well-nourished. No distress.  HENT:  Mouth/Throat: Oropharynx is clear and moist. No oropharyngeal exudate.  Cardiovascular: Normal rate, regular rhythm and normal heart sounds. Exam reveals no gallop and no friction rub.  No murmur heard.  Pulmonary/Chest: Effort normal and breath sounds  normal. No respiratory distress.  has no wheezes.  Abdominal: Soft. Bowel sounds are normal.  exhibits no distension. There is no tenderness.  Lymphadenopathy: no cervical adenopathy.  Neurological: alert and oriented to person, place, and time.  Skin: Skin is warm and dry. No rash noted. No erythema.  Psychiatric: a normal mood and affect.  behavior is normal.   Lab Results  Component Value Date   ESRSEDRATE 69* 06/17/2014   Lab Results  Component Value Date   CRP 2.3* 06/17/2014        Assessment & Plan:  Will check sed rate, crp, and bmp. Likely need to continue bactrim for enterobacter and group a strep infection. Will add amoxicillin for better strep coverage  Candidal vaginal infection. Will give fluconazole for yeast infection  rtc in 4-6 wk

## 2014-06-18 ENCOUNTER — Telehealth: Payer: Self-pay | Admitting: *Deleted

## 2014-06-18 LAB — SEDIMENTATION RATE: SED RATE: 69 mm/h — AB (ref 0–22)

## 2014-06-18 NOTE — Telephone Encounter (Signed)
Patient called to find out if she will need to continue her antibiotic. Advised the patient will have the doctor review her labs and give her a call back with the answer.

## 2014-06-18 NOTE — Telephone Encounter (Signed)
Per Dr Baxter Flattery called the patient and advised her she will have to take the medication for a little longer. Advised her the doctor will talk with her about that when she comes in December for her appt.

## 2014-06-21 MED ORDER — AMOXICILLIN 500 MG PO CAPS: 500.0000 mg | ORAL_CAPSULE | Freq: Three times a day (TID) | ORAL | Status: DC

## 2014-06-22 NOTE — Telephone Encounter (Signed)
Inflammatory markers are still elevated. She will need to continue on bactrim per discussion in clinic

## 2014-06-23 NOTE — Telephone Encounter (Signed)
Patient aware and will continue until next visit or until told otherwise.

## 2014-06-25 ENCOUNTER — Ambulatory Visit: Payer: Self-pay | Admitting: Pain Medicine

## 2014-06-30 ENCOUNTER — Telehealth: Payer: Self-pay | Admitting: *Deleted

## 2014-06-30 DIAGNOSIS — B3731 Acute candidiasis of vulva and vagina: Secondary | ICD-10-CM

## 2014-06-30 DIAGNOSIS — B373 Candidiasis of vulva and vagina: Secondary | ICD-10-CM

## 2014-06-30 MED ORDER — FLUCONAZOLE 150 MG PO TABS: 150.0000 mg | ORAL_TABLET | Freq: Every day | ORAL | Status: DC

## 2014-06-30 NOTE — Telephone Encounter (Signed)
-----   Message from Carlyle Basques, MD sent at 06/29/2014 10:01 PM EST ----- Yup. Both amox plus bactrim ----- Message -----    From: Janyce Llanos, CMA    Sent: 06/29/2014   3:03 PM      To: Carlyle Basques, MD  Dr Baxter Flattery,  The patient called back and advised she picked up [place] and was unaware you wanted her to take it with the Bactrim. Advised her per visit note she is to take it but she wanted me to ask you to make sure.   Please advise.  Darnelle Maffucci

## 2014-07-01 ENCOUNTER — Emergency Department: Payer: Self-pay | Admitting: Emergency Medicine

## 2014-07-01 LAB — COMPREHENSIVE METABOLIC PANEL
ALK PHOS: 136 U/L — AB
Albumin: 3.5 g/dL (ref 3.4–5.0)
Anion Gap: 5 — ABNORMAL LOW (ref 7–16)
BUN: 13 mg/dL (ref 7–18)
Bilirubin,Total: 0.3 mg/dL (ref 0.2–1.0)
CREATININE: 1.05 mg/dL (ref 0.60–1.30)
Calcium, Total: 9.3 mg/dL (ref 8.5–10.1)
Chloride: 105 mmol/L (ref 98–107)
Co2: 28 mmol/L (ref 21–32)
EGFR (Non-African Amer.): >60
Glucose: 103 mg/dL — ABNORMAL HIGH (ref 65–99)
Osmolality: 276 (ref 275–301)
POTASSIUM: 4.4 mmol/L (ref 3.5–5.1)
SGOT(AST): 45 U/L — ABNORMAL HIGH (ref 15–37)
SGPT (ALT): 60 U/L
Sodium: 138 mmol/L (ref 136–145)
Total Protein: 8.2 g/dL (ref 6.4–8.2)

## 2014-07-01 LAB — CBC
HCT: 38 % (ref 35.0–47.0)
HGB: 12.2 g/dL (ref 12.0–16.0)
MCH: 26.9 pg (ref 26.0–34.0)
MCHC: 32 g/dL (ref 32.0–36.0)
MCV: 84 fL (ref 80–100)
Platelet: 193 10*3/uL (ref 150–440)
RBC: 4.52 10*6/uL (ref 3.80–5.20)
RDW: 16.8 % — ABNORMAL HIGH (ref 11.5–14.5)
WBC: 6.3 10*3/uL (ref 3.6–11.0)

## 2014-07-14 ENCOUNTER — Encounter: Payer: Self-pay | Admitting: Family Medicine

## 2014-07-14 ENCOUNTER — Other Ambulatory Visit: Payer: Self-pay | Admitting: *Deleted

## 2014-07-14 DIAGNOSIS — T814XXS Infection following a procedure, sequela: Principal | ICD-10-CM

## 2014-07-14 DIAGNOSIS — IMO0001 Reserved for inherently not codable concepts without codable children: Secondary | ICD-10-CM

## 2014-07-14 MED ORDER — SULFAMETHOXAZOLE-TRIMETHOPRIM 800-160 MG PO TABS: 1.0000 | ORAL_TABLET | Freq: Two times a day (BID) | ORAL | Status: DC

## 2014-07-17 DIAGNOSIS — IMO0002 Reserved for concepts with insufficient information to code with codable children: Secondary | ICD-10-CM | POA: Insufficient documentation

## 2014-07-28 ENCOUNTER — Ambulatory Visit: Payer: Self-pay | Admitting: Pain Medicine

## 2014-07-30 ENCOUNTER — Encounter: Payer: Self-pay | Admitting: Internal Medicine

## 2014-07-30 ENCOUNTER — Ambulatory Visit (INDEPENDENT_AMBULATORY_CARE_PROVIDER_SITE_OTHER): Payer: Commercial Managed Care - HMO | Admitting: Internal Medicine

## 2014-07-30 VITALS — BP 162/107 | HR 106 | Temp 97.8°F | Wt 230.0 lb

## 2014-07-30 DIAGNOSIS — B373 Candidiasis of vulva and vagina: Secondary | ICD-10-CM

## 2014-07-30 DIAGNOSIS — M4626 Osteomyelitis of vertebra, lumbar region: Secondary | ICD-10-CM

## 2014-07-30 DIAGNOSIS — B3731 Acute candidiasis of vulva and vagina: Secondary | ICD-10-CM

## 2014-07-30 LAB — SEDIMENTATION RATE: SED RATE: 63 mm/h — AB (ref 0–22)

## 2014-07-30 LAB — C-REACTIVE PROTEIN: CRP: 0.9 mg/dL — AB (ref <0.60)

## 2014-07-31 ENCOUNTER — Other Ambulatory Visit: Payer: Self-pay | Admitting: Licensed Clinical Social Worker

## 2014-07-31 ENCOUNTER — Telehealth: Payer: Self-pay | Admitting: Licensed Clinical Social Worker

## 2014-07-31 DIAGNOSIS — IMO0001 Reserved for inherently not codable concepts without codable children: Secondary | ICD-10-CM

## 2014-07-31 DIAGNOSIS — M4620 Osteomyelitis of vertebra, site unspecified: Secondary | ICD-10-CM

## 2014-07-31 DIAGNOSIS — T814XXS Infection following a procedure, sequela: Principal | ICD-10-CM

## 2014-07-31 DIAGNOSIS — B3731 Acute candidiasis of vulva and vagina: Secondary | ICD-10-CM

## 2014-07-31 DIAGNOSIS — B373 Candidiasis of vulva and vagina: Secondary | ICD-10-CM

## 2014-07-31 MED ORDER — AMOXICILLIN 500 MG PO CAPS: 500.0000 mg | ORAL_CAPSULE | Freq: Three times a day (TID) | ORAL | Status: DC

## 2014-07-31 MED ORDER — FLUCONAZOLE 150 MG PO TABS: 150.0000 mg | ORAL_TABLET | Freq: Every day | ORAL | Status: DC

## 2014-07-31 MED ORDER — SULFAMETHOXAZOLE-TRIMETHOPRIM 800-160 MG PO TABS: 1.0000 | ORAL_TABLET | Freq: Two times a day (BID) | ORAL | Status: DC

## 2014-07-31 NOTE — Telephone Encounter (Signed)
Patient called for results of yesterdays labs, results given. Patient also wanted to know does she continue antibiotics? Please advise

## 2014-07-31 NOTE — Telephone Encounter (Signed)
Let her know that we will continue her on her antibiotics. i will refill them

## 2014-08-04 NOTE — Progress Notes (Signed)
Subjective:    Patient ID: Marissa [name], female    DOB: 1976-06-13, 38 y.o.   MRN: [id number]  HPI Marissa [name] is a 38 y.o. female with HTN ,DM, obesity s/p redo decompressive lumbar laminectomy (L5-S1) and posterior lumbar interbody fusion on 01/16/14 presents with sepsis from group a strep bacteremia and post-op wound with group a & enterobacter s/p irrigation and debridement of superficial and deep wound space infection. She was discharged on 6 wk of IV antibiotics ceftriaxone, but repeat MRI scan still showed ring-enhancing lesions one deep tissue and the epidural space as well as fairly large the epidural fluid collection as well as to her persistent draining wound thus she underwent re-exploration  her 2nd I x D on 7/15. Antibiotics changed to vancomycin and ceftriaxone for 6 wks, then in early september transitioned to bactrim which causes her to have nausea/vomiting. She takes various anti-emetics to minimize symptoms. She was seen in November, where she was given bactrim and amoxicillin which she has still been taking. Has occasional yeast infection. Today's sed rate 63 and crp 0.9    Allergies  Allergen Reactions  . Bactrim [Sulfamethoxazole-Trimethoprim] Nausea And Vomiting  . Ciprofloxacin Swelling  . Rocephin [Ceftriaxone Sodium In Dextrose] Nausea And Vomiting  . Vancomycin Nausea And Vomiting  . Azithromycin Itching and Rash  . Food Itching and Rash    "Greek yogurt"   meds reviewed Active Ambulatory Problems    Diagnosis Date Noted  . HNP (herniated nucleus pulposus), lumbar 10/29/2013  . Back pain 11/01/2013  . Wound infection 01/25/2014  . Acute respiratory failure with hypoxia 01/25/2014  . Severe sepsis(995.92) 01/30/2014  . History of lumbar laminectomy 01/30/2014  . Acute renal failure 01/30/2014  . Obesity 01/30/2014  . Anemia 01/30/2014  . HTN (hypertension) 01/30/2014  . DM (diabetes mellitus), type 2 with renal complications 18/84/1660  . Nausea  alone 02/01/2014  . Abdominal pain, unspecified site 02/01/2014  . Wound infection complicating hardware 63/08/6008   Resolved Ambulatory Problems    Diagnosis Date Noted  . No Resolved Ambulatory Problems   Past Medical History  Diagnosis Date  . Hypertension   . Coronary artery disease   . Neuropathy   . Headache(784.0)   . Arthritis   . Vision loss   . Myocardial infarction 2006  . Dysrhythmia   . Diabetes mellitus without complication   . GERD (gastroesophageal reflux disease)   . Migraines   . Hyperlipemia     Review of Systems     Objective:   Physical Exam BP 162/107 mmHg  Pulse 106  Temp(Src) 97.8 F (36.6 C) (Oral)  Wt 230 lb (104.327 kg) Physical Exam  Constitutional:  oriented to person, place, and time. appears well-developed and well-nourished. No distress.  HENT:  Mouth/Throat: Oropharynx is clear and moist. No oropharyngeal exudate.  Cardiovascular: Normal rate, regular rhythm and normal heart sounds. Exam reveals no gallop and no friction rub.  No murmur heard.  Pulmonary/Chest: Effort normal and breath sounds normal. No respiratory distress.  has no wheezes.  Abdominal: Soft. Bowel sounds are normal.  exhibits no distension. There is no tenderness.  Lymphadenopathy: no cervical adenopathy.  Neurological: alert and oriented to person, place, and time.  Skin: Skin is warm and dry. No rash noted. No erythema.  Psychiatric: a normal mood and affect.  behavior is normal.       Assessment & Plan:  Polymicrobial surgical site infection with group a strep and enterobacter s/p ixd x 2 of  decompressive lumbar laminectomy (L5-S1) and posterior lumbar interbody fusion on chronic suppressive antibiotics.  - will continue her antibiotics for one more month, will check sed rate and crp - in meantime, we will repeat imaging of lumbar spine - inflammatory markers remain elevated but patient has been on suppressive therapy for several months. Unclear if proper  correlation between infection - will have her come back in 4 wk

## 2014-08-14 ENCOUNTER — Encounter: Payer: Self-pay | Admitting: Family Medicine

## 2014-08-14 DIAGNOSIS — A419 Sepsis, unspecified organism: Secondary | ICD-10-CM

## 2014-08-14 HISTORY — DX: Sepsis, unspecified organism: A41.9

## 2014-08-17 ENCOUNTER — Ambulatory Visit: Payer: Self-pay | Admitting: Neurosurgery

## 2014-08-27 ENCOUNTER — Ambulatory Visit: Payer: Self-pay | Admitting: Pain Medicine

## 2014-08-28 DIAGNOSIS — E7800 Pure hypercholesterolemia, unspecified: Secondary | ICD-10-CM | POA: Insufficient documentation

## 2014-09-14 ENCOUNTER — Encounter: Payer: Self-pay | Admitting: Family Medicine

## 2014-09-22 ENCOUNTER — Ambulatory Visit (INDEPENDENT_AMBULATORY_CARE_PROVIDER_SITE_OTHER): Payer: PPO | Admitting: Internal Medicine

## 2014-09-22 VITALS — BP 163/114 | HR 96 | Temp 98.0°F | Wt 229.0 lb

## 2014-09-22 DIAGNOSIS — M8668 Other chronic osteomyelitis, other site: Secondary | ICD-10-CM | POA: Diagnosis not present

## 2014-09-22 LAB — CBC WITH DIFFERENTIAL/PLATELET
BASOS PCT: 0 % (ref 0–1)
Basophils Absolute: 0 10*3/uL (ref 0.0–0.1)
EOS PCT: 1 % (ref 0–5)
Eosinophils Absolute: 0.1 10*3/uL (ref 0.0–0.7)
HCT: 36.1 % (ref 36.0–46.0)
HEMOGLOBIN: 11.7 g/dL — AB (ref 12.0–15.0)
Lymphocytes Relative: 31 % (ref 12–46)
Lymphs Abs: 1.7 10*3/uL (ref 0.7–4.0)
MCH: 27.9 pg (ref 26.0–34.0)
MCHC: 32.4 g/dL (ref 30.0–36.0)
MCV: 86.2 fL (ref 78.0–100.0)
MONO ABS: 0.4 10*3/uL (ref 0.1–1.0)
MPV: 11.2 fL (ref 8.6–12.4)
Monocytes Relative: 8 % (ref 3–12)
NEUTROS ABS: 3.3 10*3/uL (ref 1.7–7.7)
NEUTROS PCT: 60 % (ref 43–77)
PLATELETS: 203 10*3/uL (ref 150–400)
RBC: 4.19 MIL/uL (ref 3.87–5.11)
RDW: 15.6 % — ABNORMAL HIGH (ref 11.5–15.5)
WBC: 5.5 10*3/uL (ref 4.0–10.5)

## 2014-09-22 LAB — BASIC METABOLIC PANEL
BUN: 8 mg/dL (ref 6–23)
CO2: 26 mEq/L (ref 19–32)
Calcium: 9.2 mg/dL (ref 8.4–10.5)
Chloride: 110 mEq/L (ref 96–112)
Creat: 0.71 mg/dL (ref 0.50–1.10)
Glucose, Bld: 83 mg/dL (ref 70–99)
POTASSIUM: 4.8 meq/L (ref 3.5–5.3)
SODIUM: 142 meq/L (ref 135–145)

## 2014-09-22 LAB — SEDIMENTATION RATE: Sed Rate: 51 mm/hr — ABNORMAL HIGH (ref 0–20)

## 2014-09-22 LAB — C-REACTIVE PROTEIN: CRP: 0.7 mg/dL — ABNORMAL HIGH (ref <0.60)

## 2014-09-22 NOTE — Progress Notes (Signed)
Subjective:    Patient ID: Marissa [name], female    DOB: [birth year], 39 y.o.   MRN: [id number]  HPI 39yo F past hx of early HW infection from lumbar fusion with group a streptococcus and enterobacter in mid June 2015. Last I x D was July 15th, 2015, no positive cultures at this time. She was on IV antibiotics then converted to oral antibiotics of amoxicillin plus bactrim for a total of 7.5 months. Recent CT scan showed healing on the left side but also noted new bone spur, per patient report. i do not have these results, but they have been reviewed with the patient by Dr. [name]. She was instructed to continue the antibiotics since her inflammatory markers were still elevated. She Still works out. Still notices Residual weakness on the left specifically, difficult to walk up stairs with weight bearing on left leg.  Current Outpatient Prescriptions on File Prior to Visit  Medication Sig Dispense Refill  . amitriptyline (ELAVIL) 100 MG tablet Take 100 mg by mouth at bedtime.     Marland Kitchen amoxicillin (AMOXIL) 500 MG capsule Take 1 capsule (500 mg total) by mouth 3 (three) times daily. 90 capsule 2  . carvedilol (COREG) 25 MG tablet Take 1 tablet (25 mg total) by mouth 2 (two) times daily with a meal. 60 tablet 2  . fluconazole (DIFLUCAN) 150 MG tablet Take 1 tablet (150 mg total) by mouth daily. If still having yeast infection after 3 days, can take additional dose 10 tablet 1  . gabapentin (NEURONTIN) 300 MG capsule Take 600 mg by mouth 3 (three) times daily.     . insulin aspart (NOVOLOG FLEXPEN) 100 UNIT/ML FlexPen Inject 10-20 Units into the skin 3 (three) times daily with meals. Sliding scale 15 mL 11  . Liraglutide (VICTOZA) 18 MG/3ML SOPN Inject 1.8 mg into the skin daily.    . nitroGLYCERIN (NITROSTAT) 0.4 MG SL tablet Place 0.4 mg under the tongue every 5 (five) minutes as needed for chest pain.    Marland Kitchen oxyCODONE (OXY IR/ROXICODONE) 5 MG immediate release tablet Take 1 tablet (5 mg total) by mouth  every 4 (four) hours as needed for moderate pain. 90 tablet 0  . sulfamethoxazole-trimethoprim (BACTRIM DS,SEPTRA DS) 800-160 MG per tablet Take 1 tablet by mouth 2 (two) times daily. 60 tablet 2  . SUMAtriptan (IMITREX) 50 MG tablet Take 50 mg by mouth every 2 (two) hours as needed for migraine or headache. May repeat in 2 hours if headache persists or recurs.    Marland Kitchen tiZANidine (ZANAFLEX) 4 MG tablet Take 1 tablet (4 mg total) by mouth 3 (three) times daily. 60 tablet 1  . topiramate (TOPAMAX) 100 MG tablet Take 100 mg by mouth at bedtime. Take with 25 mg tablet for a 125 mg dose    . metoCLOPramide (REGLAN) 10 MG tablet Take 1 tablet (10 mg total) by mouth every 6 (six) hours as needed for nausea, vomiting or refractory nausea / vomiting. (Patient not taking: Reported on 09/22/2014) 30 tablet 0  . prochlorperazine (COMPAZINE) 10 MG tablet Take 1 tablet (10 mg total) by mouth every 8 (eight) hours as needed for nausea or vomiting. (Patient not taking: Reported on 09/22/2014) 30 tablet 1   No current facility-administered medications on file prior to visit.   Active Ambulatory Problems    Diagnosis Date Noted  . HNP (herniated nucleus pulposus), lumbar 10/29/2013  . Back pain 11/01/2013  . Wound infection 01/25/2014  . Acute respiratory failure with hypoxia  01/25/2014  . Severe sepsis(995.92) 01/30/2014  . History of lumbar laminectomy 01/30/2014  . Acute renal failure 01/30/2014  . Obesity 01/30/2014  . Anemia 01/30/2014  . HTN (hypertension) 01/30/2014  . DM (diabetes mellitus), type 2 with renal complications 63/14/9702  . Nausea alone 02/01/2014  . Abdominal pain, unspecified site 02/01/2014  . Wound infection complicating hardware 63/78/5885   Resolved Ambulatory Problems    Diagnosis Date Noted  . No Resolved Ambulatory Problems   Past Medical History  Diagnosis Date  . Hypertension   . Coronary artery disease   . Neuropathy   . Headache(784.0)   . Arthritis   . Vision loss   .  Myocardial infarction 2006  . Dysrhythmia   . Diabetes mellitus without complication   . GERD (gastroesophageal reflux disease)   . Migraines   . Hyperlipemia        Review of Systems 12 point ros is negative except for what is mentioned in the hpi, still has occassional vaginal candidiasis    Objective:   Physical Exam  BP 163/114 mmHg  Pulse 96  Temp(Src) 98 F (36.7 C) (Oral)  Wt 229 lb (103.874 kg)  Constitutional:  oriented to person, place, and time. appears well-developed and well-nourished. No distress.  HENT:  Mouth/Throat: Oropharynx is clear and moist. No oropharyngeal exudate.  Cardiovascular: Normal rate, regular rhythm and normal heart sounds. Exam reveals no gallop and no friction rub.  No murmur heard.  Pulmonary/Chest: Effort normal and breath sounds normal. No respiratory distress.  has no wheezes.     Labs: Lab Results  Component Value Date   ESRSEDRATE 51* 09/22/2014   Lab Results  Component Value Date   CRP 0.7* 09/22/2014       Assessment & Plan:  Chronic Lumbar osteomyelitis c/b hw infection = will check Sed rate and crp. If still elevated, would continue with amox 500mg  tid, and bactrim DS 1 tab BID for additional 4-8wks. Will also prescribe her to do physical therapy to help with strengthening exercises.

## 2014-09-23 ENCOUNTER — Telehealth: Payer: Self-pay | Admitting: *Deleted

## 2014-09-23 NOTE — Telephone Encounter (Signed)
Patient called for lab results, advice if she should continue her antibiotics.  RN spoke with Dr. Baxter Flattery.  She requests that the patient continue her antibiotics for 2 more months.  Patient is to follow up 3/31 with Dr. Baxter Flattery.  Patient verbalized understanding, states she just got a fill of antibiotics. She will have her pharmacy request refills when she is due. Landis Gandy, RN

## 2014-09-24 ENCOUNTER — Ambulatory Visit: Payer: Self-pay | Admitting: Pain Medicine

## 2014-10-27 ENCOUNTER — Ambulatory Visit: Payer: Self-pay | Admitting: Pain Medicine

## 2014-11-09 ENCOUNTER — Ambulatory Visit: Payer: Self-pay | Admitting: Pain Medicine

## 2014-11-12 ENCOUNTER — Ambulatory Visit: Payer: Medicare HMO | Admitting: Internal Medicine

## 2014-11-12 ENCOUNTER — Ambulatory Visit (INDEPENDENT_AMBULATORY_CARE_PROVIDER_SITE_OTHER): Payer: PPO | Admitting: Internal Medicine

## 2014-11-12 ENCOUNTER — Encounter: Payer: Self-pay | Admitting: Internal Medicine

## 2014-11-12 VITALS — BP 153/94 | HR 97 | Temp 98.2°F | Wt 223.0 lb

## 2014-11-12 DIAGNOSIS — M4626 Osteomyelitis of vertebra, lumbar region: Secondary | ICD-10-CM | POA: Diagnosis not present

## 2014-11-12 LAB — C-REACTIVE PROTEIN: CRP: 0.5 mg/dL (ref <0.60)

## 2014-11-12 NOTE — Progress Notes (Signed)
Subjective:    Patient ID: Marissa [name], female    DOB: 03-Oct-1975, 39 y.o.   MRN: [id number]  HPI Marissa [name] is a 39 y.o. female with HTN ,DM, obesity s/p redo decompressive lumbar laminectomy (L5-S1) and posterior lumbar interbody fusion on 01/16/14 presents with sepsis from group a strep bacteremia and post-op wound with group a & enterobacter s/p irrigation and debridement of superficial and deep wound space infection. She was discharged on 6 wk of IV antibiotics ceftriaxone, but repeat MRI scan still showed ring-enhancing lesions one deep tissue and the epidural space as well as fairly large the epidural fluid collection as well as to her persistent draining wound thus she underwent re-exploration her 2nd I x D on 7/15. Antibiotics changed to vancomycin and ceftriaxone for 6 wks, then in early september transitioned to  given bactrim and amoxicillin which she has still been taking. She states that she has seen dr. [name] roughly 6 wk ago. No plans for further surgical interventions.   She is tolerating her baseline back pain, attempts to not over exert herself. No change in pattern of back pain, still remains low back pain not radiating  Current Outpatient Prescriptions on File Prior to Visit  Medication Sig Dispense Refill  . amitriptyline (ELAVIL) 100 MG tablet Take 100 mg by mouth at bedtime.     Marland Kitchen amoxicillin (AMOXIL) 500 MG capsule Take 1 capsule (500 mg total) by mouth 3 (three) times daily. 90 capsule 2  . carvedilol (COREG) 25 MG tablet Take 1 tablet (25 mg total) by mouth 2 (two) times daily with a meal. 60 tablet 2  . CRESTOR 20 MG tablet Take 20 mg by mouth daily.  0  . fluconazole (DIFLUCAN) 150 MG tablet Take 1 tablet (150 mg total) by mouth daily. If still having yeast infection after 3 days, can take additional dose 10 tablet 1  . gabapentin (NEURONTIN) 300 MG capsule Take 600 mg by mouth 3 (three) times daily.     . insulin aspart (NOVOLOG FLEXPEN) 100 UNIT/ML FlexPen  Inject 10-20 Units into the skin 3 (three) times daily with meals. Sliding scale 15 mL 11  . Liraglutide (VICTOZA) 18 MG/3ML SOPN Inject 1.8 mg into the skin daily.    . nitroGLYCERIN (NITROSTAT) 0.4 MG SL tablet Place 0.4 mg under the tongue every 5 (five) minutes as needed for chest pain.    Marland Kitchen oxyCODONE (OXY IR/ROXICODONE) 5 MG immediate release tablet Take 1 tablet (5 mg total) by mouth every 4 (four) hours as needed for moderate pain. 90 tablet 0  . sulfamethoxazole-trimethoprim (BACTRIM DS,SEPTRA DS) 800-160 MG per tablet Take 1 tablet by mouth 2 (two) times daily. 60 tablet 2  . tiZANidine (ZANAFLEX) 4 MG tablet Take 1 tablet (4 mg total) by mouth 3 (three) times daily. 60 tablet 1  . topiramate (TOPAMAX) 100 MG tablet Take 100 mg by mouth at bedtime. Take with 25 mg tablet for a 125 mg dose    . metoCLOPramide (REGLAN) 10 MG tablet Take 1 tablet (10 mg total) by mouth every 6 (six) hours as needed for nausea, vomiting or refractory nausea / vomiting. (Patient not taking: Reported on 09/22/2014) 30 tablet 0  . prochlorperazine (COMPAZINE) 10 MG tablet Take 1 tablet (10 mg total) by mouth every 8 (eight) hours as needed for nausea or vomiting. (Patient not taking: Reported on 09/22/2014) 30 tablet 1  . SUMAtriptan (IMITREX) 50 MG tablet Take 50 mg by mouth every 2 (two) hours as  needed for migraine or headache. May repeat in 2 hours if headache persists or recurs.     No current facility-administered medications on file prior to visit.   Active Ambulatory Problems    Diagnosis Date Noted  . HNP (herniated nucleus pulposus), lumbar 10/29/2013  . Back pain 11/01/2013  . Wound infection 01/25/2014  . Acute respiratory failure with hypoxia 01/25/2014  . Severe sepsis(995.92) 01/30/2014  . History of lumbar laminectomy 01/30/2014  . Acute renal failure 01/30/2014  . Obesity 01/30/2014  . Anemia 01/30/2014  . HTN (hypertension) 01/30/2014  . DM (diabetes mellitus), type 2 with renal complications  79/89/2119  . Nausea alone 02/01/2014  . Abdominal pain, unspecified site 02/01/2014  . Wound infection complicating hardware 41/74/0814   Resolved Ambulatory Problems    Diagnosis Date Noted  . No Resolved Ambulatory Problems   Past Medical History  Diagnosis Date  . Hypertension   . Coronary artery disease   . Neuropathy   . Headache(784.0)   . Arthritis   . Vision loss   . Myocardial infarction 2006  . Dysrhythmia   . Diabetes mellitus without complication   . GERD (gastroesophageal reflux disease)   . Migraines   . Hyperlipemia      Review of Systems 10 point ros is negative other than back pain discussed in hpi    Objective:   Physical Exam  BP 153/94 mmHg  Pulse 97  Temp(Src) 98.2 F (36.8 C) (Oral)  Wt 223 lb (101.152 kg) No exam  Lab Results  Component Value Date   ESRSEDRATE 22* 11/12/2014   Lab Results  Component Value Date   CRP 0.5 11/12/2014       Assessment & Plan:  HW infection = will have her finish up last 2 wks of antibiotics to complete course. Will check sed rate and crp today, anticipate that they will back back to normal since she has been on prolonged course of oral antibiotics. If normalized, no need for further treatment and we will see her back as needed.

## 2014-11-13 LAB — SEDIMENTATION RATE: SED RATE: 22 mm/h — AB (ref 0–20)

## 2014-11-26 ENCOUNTER — Ambulatory Visit
Admit: 2014-11-26 | Disposition: A | Discharge status: Home / Self Care | Payer: Self-pay | Attending: Pain Medicine | Admitting: Pain Medicine

## 2014-12-05 NOTE — Consult Note (Signed)
PATIENT NAME:  Marissa [name], QUAKENBUSH MR#:  [id number] DATE OF BIRTH:  January 15, 1976  DATE OF CONSULTATION:  03/16/2014  INFECTIOUS DISEASE CONSULT:    REFERRING PHYSICIAN:   [name], MD   CONSULTING PHYSICIAN:  Cheral Marker. Ola Spurr, MD  REASON FOR CONSULTATION: Pneumonia and back infection.   HISTORY OF PRESENT ILLNESS: This is a very pleasant 39 year old female with long-standing insulin-dependent diabetes who has had multiple surgeries on her back since June 4.  At that time she had undergone a L5-S1 fusion; however, she developed infection at the wound site and was admitted and had a washout in the Emergency Room. She was discharged on IV antibiotics. Apparently, she represents again with follow-up infection and had repeat surgery July 15, at [facility]. She has remained on vancomycin and ceftriaxone since that time. Details are not fully available but she does say she grew  in her blood. Since that time, she has been admitted twice to [facility] with symptoms of shortness of breath. She was initially treated for possible hospital-acquired pneumonia when she was admitted July 21, through the July 25. She responded to a course of doxycycline in addition to the vancomycin and she had a short course of Zosyn as well. She was readmitted July 31, again with increasing dyspnea on exertion. She has had shortness of breath at rest; she has not had any fevers, chills, or night sweats. She reports her back incision is healing somewhat. She is quite deconditioned and continues to have weakness.   PAST MEDICAL HISTORY: 1.  Chronic back pain status-post surgery with postoperative complications as above.  2.  Diabetes for 20 years, insulin-dependent.  3.  Diabetic neuropathy.  4.  Hypertension.  5.  Hyperlipidemia.   SOCIAL HISTORY: She lives by herself; no tobacco, alcohol, or drugs.   FAMILY HISTORY: Mother and father are both deceased.   ALLERGIES: CIPROFLOXACIN AND ZITHROMAX AND YOGURT.    ANTIBIOTICS:  Since admission include:  1.  Ceftriaxone.  2.  Vancomycin.  3.  Levofloxacin.   REVIEW OF SYSTEMS: Eleven systems reviewed and negative except as per HPI.   PHYSICAL EXAMINATION: VITAL SIGNS:  Temperature 97.5, pulse 88, blood pressure 117/81, respirations 18, saturation 97% on room air.  GENERAL: She is pleasant, interactive, no acute distress, although she is chronically ill-appearing.  HEENT:  Pupils equal, round, and accommodation. Extraocular movements are intact. Sclerae are anicteric.  OROPHARYNX:  Clear.  NECK:  Supple.  HEART:  Regular but distant heart sounds.  LUNGS:  Coarse breath sounds bilaterally, decreased breath sounds bilateral bases.  ABDOMEN: Soft, obese, nontender, nondistended.  BACK:  She has a well approximated postoperative incision; there is no erythema or tenderness or drainage, sutures are in place.  EXTREMITIES: With 1+ edema bilateral lower extremities.  NEUROLOGIC: She is alert and oriented x 3, grossly nonfocal neurologic exam.   DATA: White blood count is 5.9, hemoglobin 8.4, platelets 155,000; blood cultures July 31, are no growth to date. Urinalysis 07/31, was negative; prior blood cultures have also been negative. Renal function shows a creatinine of 1.43, BUN of 12, albumin is low at 2.3; LFTs slight elevation of AST of 72, most recent vancomycin trough was 23.   IMAGING: CT of her chest done today August 3, revealed bilateral small pleural effusions with mild patchy groundglass opacity in the right lower lobe, left upper lobe, and left lower lobe, likely reflecting resolving pneumonia; there is no pneumothorax; no enlarged lymph nodes, heart is normal size with no pericardial  effusions.   BNP done 07/31, was 10,096.   IMPRESSION: A 39 year old female with a history of recent postoperative complications from a lumbar spine surgery done June 5. She has had to have 2 washouts, and has been on broad-spectrum antibiotics with vancomycin  and ceftriaxone at home; this is working through her PICC line. The wound is healing; however, she has been readmitted now twice with dyspnea on exertion, shortness of breath. CT shows some pleural effusions and mild possible pneumonia. I have reviewed her micro data from [facility], and she grew in her wound group B streptococcus and moderate Enterobacter aerogenes. She did have blood cultures as well, positive for group A streptococcus from June 13. She had a methicillin resistant Staphylococcus aureus screen that was positive for methicillin resistant Staphylococcus aureus, but I cannot detect any findings of actual methicillin resistant Staphylococcus aureus in her wound or in her blood.   At this point, she does not have a white count, does not have a fever; I am not impressed that she has an overwhelming pneumonia. I think her symptoms of shortness of breath and dyspnea may be related more to volume overload. There is always a possibility she could have developed endocarditis leading to valvular dysfunction as well.   RECOMMENDATIONS: 1.  Check an echocardiogram.  2.  Continue vancomycin and ceftriaxone. I would discharge her on these and have her follow up with ID at [facility], where her neurosurgeons are and the plan is already in place.  3. If she worsens, I would suggest considering a bronchoscopy before attempting to obtain sputum cultures.   Thank you for the consult.   I will be glad to follow with you.    ____________________________ Cheral Marker. Ola Spurr, MD dpf:nt D: 03/16/2014 16:01:00 ET T: 03/16/2014 17:45:50 ET JOB#: [id number]  cc: Cheral Marker. Ola Spurr, MD, <Dictator> Adelaide Pfefferkorn Ola Spurr MD ELECTRONICALLY SIGNED 03/20/2014 22:06

## 2014-12-05 NOTE — H&P (Signed)
PATIENT NAME:  YARI, SZELIGA MR#:  [id number] DATE OF BIRTH:  Jul 25, 1976  DATE OF ADMISSION:  03/03/2014  PRIMARY CARE PHYSICIAN: Dr. Netty Starring.   CHIEF COMPLAINT: Shortness of breath and cough.   HISTORY OF PRESENT ILLNESS: A 39 year old female who presents with shortness of breath, and a nonproductive cough with low-grade fever. The patient is actually being treated for an infection after back surgery with vancomycin and Rocephin. She has a PICC line placed; however, over the past day, she has had these symptoms of shortness of breath and low-grade fever. She presented to the ER where she obtained a CT scan of her chest, which was negative for PE; however, did show pneumonia. She was started on antibiotics including vancomycin and Zosyn.   REVIEW OF SYSTEMS:   CONSTITUTIONAL: Positive low-grade fever, fatigue and weakness.  EYES: No blurred or double vision, glaucoam ENT: No ear pain, hearing loss,postnasal drip.  RESPIRATORY: Positive nonproductive cough. No painful respiration or COPD.  CARDIOVASCULAR: No chest pain, orthopnea, edema, arrhythmia, dyspnea on exertion, palpitations, or syncope.  GASTROINTESTINAL: No nausea, vomiting, diarrhea, abdominal pain, melena, or ulcers. GENITOURINARY: No dysuria or hematuria. ENDOCRINE: No polyuria polydipsia SKIN: She has a drain in her back (from her infection. MUSCULOSKELETAL: weakness NEUROLOGIC: No history of CVA, TIA, or seizures.  PSYCHIATRIC: Positive depression and anxiety.  PAST MEDICAL HISTORY:   1. Diabetes.  2. Prolonged QT.  3. History of CAD and MI in 2006.  4. Peripheral neuropathy.  5. Migraines.  6. Chronic back and neck pain.  7. Depression and anxiety. 8. Hyperlipidemia.   PAST SURGICAL HISTORY:  1. Back surgery in June 2015.  2. Multiple back surgeries.  3. Carpal tunnel release.  4. oophorectomy, bilateral. 5. Bone was removed from her neck.   ALLERGIES: GREEK YOGURT CAUSES HIVES; ZITHROMAX ITCHING; AND  CIPROFLOXACIN SWELLING.  MEDICATIONS: 1. Zanaflex 4 mg b.i.d. p.r.n.  2. Victoza 1.8 subcutaneously daily. 3. Topamax 100 mg 2 tablets daily. 4. Simvastatin 40 mg daily. 5. Phenergan 25 mg rectally p.r.n. 6. Oxycodone 15 mg 1/2 tablet p.o. 4-6 hours p.r.n.  7. Zofran 4 mg q.6 h. p.r.n.   8. NovoLog sliding scale insulin.  9. Imitrex 50 mg daily p.r.n.  10. Heparin flush for her PICC line.  11. Gabapentin 300 mg 2 tablets daily 12. Rocephin 1 gram IV q.24 h.  13. Coreg 12.5 b.i.d.  14. Amitriptyline 50 mg daily. 15. She is also on vancomycin 1 gram daily.   SOCIAL HISTORY: No tobacco, alcohol or drug use.   FAMILY HISTORY: Positive for diabetes, CVA, hypertension.   PHYSICAL EXAMINATION:  VITAL SIGNS: Temperature 98.6, pulse 99, respirations 20, blood pressure 132/99, and 100% on room air.  GENERAL: The patient is alert, oriented, does not appear to be in acute distress.  HEENT: Head is atraumatic. Pupils are round, sclerae anicteric. Mucous membranes are moist. Oropharynx is clear. NECK: Supple. No JVD or carotid bruits. CARDIOVASCULAR: Regular rate and rhythm. No murmurs, gallops, or rubs. PMI is not displaced.  LUNGS: She has crackles at the left lung base, good air sounds. No egophony, no dullness to percussion.  ABDOMEN: Bowel sounds are positive. Nontender, and nondistended. Hard to appreciate organomegaly due to body habitus.  EXTREMITIES: No clubbing, cyanosis or edema.  BACK: She has a drain placed and an ABD dressing with no purulent discharge notable.  NEUROLOGIC: Cranial nerves II through XII were intact, no focal deficits.   LABORATORY DATA: Sodium 142, potassium 3.6, chloride 109, bicarbonate 25, BUN  12, creatinine 1.36, glucose 180. Troponin less than 0.02. White blood cells 10.2, hemoglobin 8.8, hematocrit 27.6, platelets 283,000.   CT scan of the chest shows negative pulmonary emboli; however, it does show left lower lobe pneumonia with patchy areas of atelectasis  or early pneumonia in the lingula and right lower lobe, small to moderate size bilateral pleural effusions.   Chest x-ray  subsegmental atelectasis or pneumonia in the left lower lobe.   Ultrasound of the lower extremities negative for DVT.   EKG shows sinus tachycardia, heart rate of 114. No ST elevation.   ASSESSMENT AND PLAN: A 39 year old female who is currently being treated for an infection after back surgery. Has a PICC line, on vancomycin and Rocephin, presents with shortness of breath, found to have a left lower lobe pneumonia, likely healthcare-associated.  1. Healthcare-associated pneumonia. The patient presented with shortness of breath and requirement for oxygen, and a CT scan and chest x-ray consistent with pneumonia. We will continue vancomycin and Rocephin. Also add Zosyn to her regimen. Blood cultures had been ordered in the ER.  2. She has a PICC line in place. PICC care will be ordered as well.  3. Diabetes. The patient is on sliding scale insulin. Continue outpatient medications.  4. Malignant hypertension. The patient's blood pressure is elevated. We will continue her outpatient medications and order hydralazine p.r.n. Monitor blood pressure carefully.  5. History of coronary artery disease. We will continue outpatient medications including simvastatin. Unclear why the patient is not on aspirin, but will add aspirin to her regimen.  6. Recent back surgery with infection. The patient has a PICC line placed. We will continue vancomycin and Rocephin. She has home healthcare. We will continue wound care, ABD dressing changes, and her drain to be drained daily.  7. The patient is Full Code status.   TIME SPENT: Approximately 50 minutes.    ____________________________ Donell Beers. Benjie Karvonen, MD spm:jr D: 03/03/2014 17:22:19 ET T: 03/03/2014 18:13:25 ET JOB#: [id number]  cc: Oneisha Ammons P. Benjie Karvonen, MD, <Dictator> [name], MD Donell Beers Cashae Weich MD ELECTRONICALLY SIGNED 03/03/2014 20:21

## 2014-12-05 NOTE — Consult Note (Signed)
PATIENT NAME:  Marissa [name], Marissa [name] MR#:  [id number] DATE OF BIRTH:  10/07/1975  DATE OF CONSULTATION:  03/16/2014  INFECTIOUS DISEASE CONSULT:    REFERRING PHYSICIAN:   [name], MD   CONSULTING PHYSICIAN:  Cheral Marker. Ola Spurr, MD  REASON FOR CONSULTATION: Pneumonia and back infection.   HISTORY OF PRESENT ILLNESS: This is a very pleasant 39 year old female with long-standing insulin-dependent diabetes who has had multiple surgeries on her back since June 4.  At that time she had undergone a L5-S1 fusion; however, she developed infection at the wound site and was admitted and had a washout in the Emergency Room. She was discharged on IV antibiotics. Apparently, she represents again with follow-up infection and had repeat surgery July 15, at [facility]. She has remained on vancomycin and ceftriaxone since that time. Details are not fully available but she does say she grew  in her blood. Since that time, she has been admitted twice to [facility] with symptoms of shortness of breath. She was initially treated for possible hospital-acquired pneumonia when she was admitted July 21, through the July 25. She responded to a course of doxycycline in addition to the vancomycin and she had a short course of Zosyn as well. She was readmitted July 31, again with increasing dyspnea on exertion. She has had shortness of breath at rest; she has not had any fevers, chills, or night sweats. She reports her back incision is healing somewhat. She is quite deconditioned and continues to have weakness.   PAST MEDICAL HISTORY: 1.  Chronic back pain status-post surgery with postoperative complications as above.  2.  Diabetes for 20 years, insulin-dependent.  3.  Diabetic neuropathy.  4.  Hypertension.  5.  Hyperlipidemia.   SOCIAL HISTORY: She lives by herself; no tobacco, alcohol, or drugs.   FAMILY HISTORY: Mother and father are both deceased.   ALLERGIES: CIPROFLOXACIN AND ZITHROMAX AND YOGURT.    ANTIBIOTICS:  Since admission include:  1.  Ceftriaxone.  2.  Vancomycin.  3.  Levofloxacin.   REVIEW OF SYSTEMS: Eleven systems reviewed and negative except as per HPI.   PHYSICAL EXAMINATION: VITAL SIGNS:  Temperature 97.5, pulse 88, blood pressure 117/81, respirations 18, saturation 97% on room air.  GENERAL: She is pleasant, interactive, no acute distress, although she is chronically ill-appearing.  HEENT:  Pupils equal, round, and accommodation. Extraocular movements are intact. Sclerae are anicteric.  OROPHARYNX:  Clear.  NECK:  Supple.  HEART:  Regular but distant heart sounds.  LUNGS:  Coarse breath sounds bilaterally, decreased breath sounds bilateral bases.  ABDOMEN: Soft, obese, nontender, nondistended.  BACK:  She has a well approximated postoperative incision; there is no erythema or tenderness or drainage, sutures are in place.  EXTREMITIES: With 1+ edema bilateral lower extremities.  NEUROLOGIC: She is alert and oriented x 3, grossly nonfocal neurologic exam.   DATA: White blood count is 5.9, hemoglobin 8.4, platelets 155,000; blood cultures July 31, are no growth to date. Urinalysis 07/31, was negative; prior blood cultures have also been negative. Renal function shows a creatinine of 1.43, BUN of 12, albumin is low at 2.3; LFTs slight elevation of AST of 72, most recent vancomycin trough was 23.   IMAGING: CT of her chest done today August 3, revealed bilateral small pleural effusions with mild patchy groundglass opacity in the right lower lobe, left upper lobe, and left lower lobe, likely reflecting resolving pneumonia; there is no pneumothorax; no enlarged lymph nodes, heart is normal size with no pericardial  effusions.   BNP done 07/31, was 10,096.   IMPRESSION: A 39 year old female with a history of recent postoperative complications from a lumbar spine surgery done June 5. She has had to have 2 washouts, and has been on broad-spectrum antibiotics with vancomycin  and ceftriaxone at home; this is working through her PICC line. The wound is healing; however, she has been readmitted now twice with dyspnea on exertion, shortness of breath. CT shows some pleural effusions and mild possible pneumonia. I have reviewed her micro data from [facility], and she grew in her wound group B streptococcus and moderate Enterobacter aerogenes. She did have blood cultures as well, positive for group A streptococcus from June 13. She had a methicillin resistant Staphylococcus aureus screen that was positive for methicillin resistant Staphylococcus aureus, but I cannot detect any findings of actual methicillin resistant Staphylococcus aureus in her wound or in her blood.   At this point, she does not have a white count, does not have a fever; I am not impressed that she has an overwhelming pneumonia. I think her symptoms of shortness of breath and dyspnea may be related more to volume overload. There is always a possibility she could have developed endocarditis leading to valvular dysfunction as well.   RECOMMENDATIONS: 1.  Check an echocardiogram.  2.  Continue vancomycin and ceftriaxone. I would discharge her on these and have her follow up with ID at [facility], where her neurosurgeons are and the plan is already in place.  3. If she worsens, I would suggest considering a bronchoscopy before attempting to obtain sputum cultures.   Thank you for the consult.   I will be glad to follow with you.    ____________________________ Cheral Marker. Ola Spurr, MD dpf:nt D: 03/16/2014 16:01:15 ET T: 03/16/2014 17:45:50 ET JOB#: [id number]  cc: Cheral Marker. Ola Spurr, MD, <Dictator>

## 2014-12-05 NOTE — Discharge Summary (Signed)
PATIENT NAME:  Marissa [name], Marissa [name] MR#:  [id number] DATE OF BIRTH:  1975/10/29  DATE OF ADMISSION:  03/13/2014 DATE OF DISCHARGE:  03/20/2014  ADDENDUM  She is being discharged to a SNF. The patient was unable to be discharged yesterday because she suffered chest discomfort and her function was inhibited due to this chest discomfort and dyspnea. Therefore, she was not able to be discharged to home. Instead, she meets criteria for discharge to a rehab facility where she will receive nursing care and also therapy. She will also need cardiac rehab as outpatient and needs follow-up with neurosurgery after discharge and will need evaluation of her wounds where she has suture placement. Prior to discharge, she did receive a CT angio. It was negative for PE, did show pulmonary effusions and also congestive heart failure. The patient also had troponins that were negative. Symptoms are stable at this time. She is stable for discharge to rehab. Will need to follow up when neurosurgery as an outpatient, and if she is to return I have recommended that she return to [facility] for admission where her neurosurgeons are present.   ____________________________ [name], MD kl:sb D: 03/20/2014 08:37:23 ET T: 03/20/2014 11:21:17 ET JOB#: [id number]  cc: [name], MD, <Dictator> [name] MD ELECTRONICALLY SIGNED 04/13/2014 8:24

## 2014-12-05 NOTE — H&P (Signed)
Subjective/Chief Complaint nausea and vomiting   History of Present Illness 39 year old female with history of IDDM type 2, HTN, chronic back pain s/p laminectomy in march with recent hospitalization and discharge 6/5-6/25 for revision at [facility] due to infection, presents with intractable nausea and vomiting that started at least for the last 3 days. Patient states that while she was hospitalized she had abdominal pain and nausea that was worked but work was unrevealing. Records are not fully available to understand the workup that occured during that hospitaliztion however on discussing with ED physician here, patient was septic, bacteremic (unknown bacteria) from the back infection and was discharged with PICC for daily ceftriaxone therapy, and also had ARF during that hospital stay and at time of discharge, patient's Cr was 5. Patient states that since discharge she has had constant nausea and emesis, exacerbated by the smell of food, no alleviating factors. She also has sharp, severe, intermittent abdominal pain in the lower quadrants. She cannot identify any aggravating or relieving factors for the abdominal pain. Due to constant emesis patient admits to noncompliance with all her PO medications. She was noted to be hyperglycemic in ED BG 286 with normal anion gap, Cr 2.47 and mildly elevated lipase 553   Past History 1. CAD s/p MI, prolonged QT  2. Type 2 diabetes, on insulin.  3. Benign hypertension.  4. Hyperlipidemia.  5. Anxiety/depression.  6. Osteoarthritis.  7. Degenerative disk disease. s/p lumbar spine back surgery x 3 8. Chronic back pain.  9. Migraine headaches.  10. Sleep apnea, on CPAP.  11. Status post oophorectomy. 12.         Recent hospitalization with bacteremia, sepsis and ?osteomyelitis vs hardware infection 13.         Peripheral neuropathy   Past Medical Health Coronary Artery Disease, Hypertension, Diabetes Mellitus   Primary Physician Dr. Netty Starring   Past  Med/Surgical Hx:  IDDM:   Prolonged QT:   MI: 2006  Peripheral Neuropathy:   neuropathy:   Migraines:   chronic neck and back pain:   blockage in my heart:   Diabetes Mellitus, Type II (NIDD):   Depression:   Anxiety:   Hypertension:   Hypercholesterolemia:   Multiple back surgeries:   Carpal Tunnel Release: bilateral  Back Surgery:   bones removed from neck:   Hysterectomy - Partial:   ALLERGIES:  Cipro: Swelling  Zithromax: Itching  [place] yogurt: Hives, Rash  HOME MEDICATIONS: Medication Instructions Status  Zanaflex 4 mg oral tablet LIMIT  1  TAB PO / DAY OR BID - TID IF TOLERATED Active  oxyCODONE 15 mg oral tablet LIMIT  1/2 - 1 TAB PO  4 - 6  TIMES / DAY IF TOLERATED  Active  simvastatin 40 mg oral tablet 1 tab(s) orally once a day (at bedtime) Active  lisinopril 20 mg oral tablet 2 tabs (80m) orally once a day Active  Victoza 18 mg/3 mL subcutaneous solution 1.8 milliliter(s) subcutaneous once a day Active  amitriptyline 50 mg oral tablet 1 tab(s) orally once a day (at bedtime) Active  NovoLOG 100 units/mL subcutaneous solution  subcutaneous 3 times a day (before meals), As Needed per sliding scale Active  Topamax 100 mg oral tablet 1.5 tabs (1573m orally once a day (at bedtime) Active  Imitrex 50 mg oral tablet 1 tab(s) orally once, As Needed - for Headache Active  carvedilol 12.5 mg oral tablet 1 tab(s) orally 2 times a day Active  gabapentin 300 mg oral  capsule 2 caps (669m) orally 3 times a day Active   Family and Social History:  Family History Coronary Artery Disease  Diabetes Mellitus  Stroke   Social History negative tobacco, negative ETOH, negative Illicit drugs   Place of Living Home   Review of Systems:  Subjective/Chief Complaint nausea, vomiting, abdominal pain   Fever/Chills No  chills, no fever, temp at home 99, some fatigue   Cough No   Sputum No   Abdominal Pain Yes   Diarrhea Yes  mild but resolved   Constipation No    Nausea/Vomiting Yes   SOB/DOE No   Chest Pain No  no leg swelling, no palpitations   Telemetry Reviewed NSR   Dysuria No  no frequency, no urgency   Tolerating PT Yes   Tolerating Diet No   ROS + headache, + chronic left leg numbness and tingling. Denies blurred vision, eye pain, tinnitus, ear pain, sore throat, no new skin rashes,  no new myalgias, arthralgias, no joint swelling,. Chronic anxiety and depression.   Medications/Allergies Reviewed Medications/Allergies reviewed   Physical Exam:  GEN well developed, well nourished, obese, ill appearing, in moderate distress   HEENT PERRL, hearing intact to voice, dry oral mucosa, Oropharynx clear, good dentition   NECK supple  No masses   RESP normal resp effort  clear BS   CARD regular rate  No LE edema  no JVD   VASCULAR ACCESS left UE PICC line   ABD positive tenderness  soft  normal BS  no Adominal Mass  tender in b/l Lower quadrants   EXTR negative cyanosis/clubbing, negative edema   SKIN normal to palpation, No rashes, skin turgor good   NEURO cranial nerves intact, follows commands, motor/sensory function intact   PSYCH alert, A+O to time, place, person   Lab Results:  Hepatic:  28-Jun-15 09:33   Bilirubin, Total 0.4  Alkaline Phosphatase 93 (45-117 NOTE: New Reference Range 07/04/13)  SGPT (ALT) 34  SGOT (AST)  74  Total Protein, Serum 7.7  Albumin, Serum  2.0  Routine Micro:  28-Jun-15 09:33   Micro Text Report BLOOD CULTURE   COMMENT                   NO GROWTH IN 8-12 HOURS   ANTIBIOTIC                       Culture Comment NO GROWTH IN 8-12 HOURS  Result(s) reported on 08 Feb 2014 at 05:00PM.  Routine Chem:  28-Jun-15 09:33   Glucose, Serum  286  BUN 12  Creatinine (comp)  2.47  Sodium, Serum 138  Potassium, Serum  3.4  Chloride, Serum 107  CO2, Serum 22  Calcium (Total), Serum  8.2  Osmolality (calc) 286  eGFR (African American)  28  eGFR (Non-African American)  24 (eGFR values  <671mmin/1.73 m2 may be an indication of chronic kidney disease (CKD). Calculated eGFR is useful in patients with stable renal function. The eGFR calculation will not be reliable in acutely ill patients when serum creatinine is changing rapidly. It is not useful in  patients on dialysis. The eGFR calculation may not be applicable to patients at the low and high extremes of body sizes, pregnant women, and vegetarians.)  Result Comment POTASSIUM/AST - Slight hemolysis, interpret results with  - caution.  Result(s) reported on 08 Feb 2014 at 10:14AM.  Anion Gap 9  Lipase  553 (Result(s) reported on 08 Feb 2014 at 10:08AM.)  Cardiac:  28-Jun-15 09:33   Troponin I 0.03 (0.00-0.05 0.05 ng/mL or less: NEGATIVE  Repeat testing in 3-6 hrs  if clinically indicated. >0.05 ng/mL: POTENTIAL  MYOCARDIAL INJURY. Repeat  testing in 3-6 hrs if  clinically indicated. NOTE: An increase or decrease  of 30% or more on serial  testing suggests a  clinically important change)  Routine UA:  28-Jun-15 13:25   Color (UA) Straw  Clarity (UA) Clear  Glucose (UA) >=500  Bilirubin (UA) Negative  Ketones (UA) Negative  Specific Gravity (UA) 1.007  Blood (UA) 1+  pH (UA) 8.0  Protein (UA) 30 mg/dL  Nitrite (UA) Negative  Leukocyte Esterase (UA) Negative (Result(s) reported on 08 Feb 2014 at 01:45PM.)  RBC (UA) 13 /HPF  WBC (UA) 3 /HPF  Bacteria (UA) NONE SEEN  Epithelial Cells (UA) 6 /HPF (Result(s) reported on 08 Feb 2014 at 01:45PM.)  Routine Hem:  28-Jun-15 09:33   WBC (CBC) 10.6  RBC (CBC)  3.55  Hemoglobin (CBC)  9.8  Hematocrit (CBC)  29.6  Platelet Count (CBC) 303 (Result(s) reported on 08 Feb 2014 at 10:13AM.)  MCV 83  MCH 27.6  MCHC 33.1  RDW  14.6   Radiology Results: XRay:    28-Jun-15 10:07, Abdomen 3 Way Includes PA Chest  Abdomen 3 Way Includes PA Chest  REASON FOR EXAM:    pain in abd  COMMENTS:       PROCEDURE: DXR - DXR ABDOMEN 3-WAY (INCL PA CXR)  - Feb 08 2014  10:07AM     CLINICAL DATA:  recent spinal fusion surgery (June5,2015), pt states  she got a blood infection and had to have another surgery following,  on antibiotics, today pt very weak, N/V/D, LLQ pain, pt has brace  that cannot be removed while pt is standing    EXAM:  ABDOMEN SERIES    COMPARISON:  02/01/2014  FINDINGS:  Heart size and mediastinal contours are within normal limits.    Patchy interstitial and airspace opacities in the left mid and lower  lobe, improving since previous exam. Left PICC line to the proximal  SVC. Cervical fixation hardware partially seen.    No free air. Normal bowel gas pattern.    There are no abnormal calcifications.    Fusion hardware L5-S1.     IMPRESSION:  1. Normal bowel gas pattern.  No free air.  2. Improving left mid and lower lung active airspace disease.  3. Postop changes as above.    Negative abdominal radiographs.      Electronically Signed    By: Arne Cleveland M.D.    On: 02/08/2014 10:39         Verified By: Kandis Cocking, M.D.,  [place]:    28-Jun-15 13:59, US Abdomen Limited Survey  US Abdomen Limited Survey  REASON FOR EXAM:    abd pain  COMMENTS:   Body Site: Gallbladder, Liver, Common Bile Duct    PROCEDURE: [place]  - US ABDOMEN LIMITED SURVEY  - Feb 08 2014  1:59PM     CLINICAL DATA:  abd pain    EXAM:  US ABDOMEN LIMITED - RIGHT UPPER QUADRANT    COMPARISON:  CT 01/29/2014    FINDINGS:  Gallbladder:  Multiple mobile stones in the gallbladder measuring up to 12 mm  diameter. No gallbladder wall thickening or pericholecystic fluid.  Sonographer reports no sonographic Murphy's sign.    Common bile duct:    Diameter: 5.5 mm    Liver:  Echogenic without focal lesion or intrahepatic biliary ductal  dilatation.     IMPRESSION:  1. Cholelithiasis without other ultrasound evidence of cholecystitis  or biliary obstruction      Electronically Signed    By: Arne Cleveland M.D.    On: 02/08/2014  14:05         Verified By: Kandis Cocking, M.D.,    Assessment/Admission Diagnosis 39 year old female presenting with intractable nausea and vomiting, hypokalemia, acute renal failure per history, hyperglycemia  EKG reviewed by me NSR at 94 bpm with QTc 495   Plan 1. intractable nausea and vomiting:  Has history of prolonged QT, EKG reviewed today QTc 495. On Zofran prn, place on telemetry as she could develop arrhythmias - Elevated lipase, suspsect may be due to prolonged emesis, no evidence of acute pancreatitis on [place]. Medical records from outside facility have been requested, if no CT abdomen was ordered then would check Ct abdomen.  - check lactic acid - prn pain medicines  2. Hypokalemia, repleted, BMP in am  3. Acute renal failure, Cr 2.45 here is improved from reported Cr 5 at time of discharge. Continue IVF, BMP in am  4. Hyperglycemia, IDDM: SSI  5. recent bacteremia, with probable hardware/discitis/osteomyelitis - clarify diagnosis once medical records available - continue daily ceftriaxone - follow up blood cultures  6. HTN - IV metoprolol for now, can resume oral meds when able  7. chronic back pain with neuropathy  8. CAD, home meds when able  DVT proph: heparin F/E/N: NS @ 19m/hr, clear liquid diet, advance as tolerated  FULL CODE, surrogate decision maker is sister SNewton [phone] Time Spent: 70 minutes  medical records were reviewed, last hospitalization at our facility was for syncopal event in 2012. Case discussed with ED attending.   Electronic Signatures: OSamson Frederic(MD)  (Signed 28-Jun-15 17:50)  Authored: CHIEF COMPLAINT and HISTORY, PAST MEDICAL/SURGIAL HISTORY, ALLERGIES, HOME MEDICATIONS, FAMILY AND SOCIAL HISTORY, REVIEW OF SYSTEMS, PHYSICAL EXAM, LABS, Radiology, ASSESSMENT AND PLAN   Last Updated: 28-Jun-15 17:50 by OSamson Frederic(MD)

## 2014-12-05 NOTE — Discharge Summary (Signed)
PATIENT NAME:  Marissa [name], GOBLE MR#:  [id number] DATE OF BIRTH:  03-27-1976  DATE OF ADMISSION:  03/03/2014 DATE OF DISCHARGE:  03/07/2014  DISCHARGE DIAGNOSES:  1.  Hospital-acquired pneumonia.  2.  Chronic back pain, status post surgery infectious complications on long-term IV antibiotics.  3.  History of migraines. 4.  Acute anemia.  5.  History of coronary artery disease.  6.  Hypertension.  7.  Insulin-dependent diabetes.   DISCHARGE MEDICATIONS:  1. Simvastatin 40 mg p.o. at bedtime.  2. Victoza 18 mg/1.8 mL subcutaneous a day. 3. Amitriptyline 50 mg 1 tab p.o. daily.  4. NovoLog 3 times a day with meals as needed per sliding scale.  5. Topamax 100 mg 1-1/2 tabs p.o. at bedtime.  6.  Imitrex 50 mg p.o. as needed. May repeat 2 hours later if headache not improved.  7.  Zanaflex 4 mg p.o. up to 3 times a day as tolerated and needed for muscle spasms.  8.  Carvedilol 12.5 mg p.o. b.i.d.  9.  Gabapentin 300 mg 2 capsules t.i.d.  10.  Oxycodone 15 mg p.o. q. 6 hours as needed for severe pain.  11.  Ceftriaxone 2 grams IV per PICC every 24 hours.  12.  Vancomycin 1 gram IV per PICC every 12 hours.  13.  Potassium chloride 20 mEq p.o. b.i.d.  14.  Doxy 100 mg p.o. b.i.d. x 6 more days.   CONSULTS: None.   PROCEDURES: The patient was transfused 1 unit of packed red blood cells.  PERTINENT LABORATORY AND STUDIES: On day of discharge, sodium 144, potassium 2.9, creatinine 1.24, glucose 131. White blood cell count 6.9, hemoglobin 8.6, platelets of 286,000.   BRIEF HOSPITAL COURSE: Hospital-acquired pneumonia. The patient initially came in with worsening shortness of breath and the CT scan showed bilateral infiltrates consistent with pneumonia. She was already on ceftriaxone and vancomycin, and broadened the coverage to Zosyn. She was on that for 3 days. She was transitioned over to oral doxycycline to cover atypicals.  She will be on this for 6 more days and she will continue long-term  antibiotics with IV Vancomycin and Zosyn as previously dosed.   Other chronic issues remained unchanged. She will continue on her regimen as before. She is to hold her aspirin due to acute anemia upon admission, though Hemoccult was negative. She was transfused 1 unit and her breathing did improve with that. She has not had any further issues.   DISPOSITION: She is in stable condition to be discharged to home with home health, PICC line care per policy, and IV antibiotics as previously directed. Follow up with Dr. Netty Starring in 10 days. Follow with neurosurgery as directed by their office visit.   I did take more than 30 minutes to do this discharge.    ____________________________ [name], MD kl:ts D: 03/07/2014 09:35:28 ET T: 03/07/2014 12:59:47 ET JOB#: [id number]  cc: [name], MD, <Dictator> [name] MD ELECTRONICALLY SIGNED 03/08/2014 8:06

## 2014-12-05 NOTE — Consult Note (Signed)
Brief Consult Note: Diagnosis: Hypoxia, resolving PNA, Lumbar site infection, prior Grp A Strep bacteremia.   Patient was seen by consultant.   Consult note dictated.   Recommend further assessment or treatment.   Orders entered.   Comments: Cont current abx for the wound infection check echo.  Electronic Signatures: Angelena Form (MD)  (Signed 04-Aug-15 08:31)  Authored: Brief Consult Note   Last Updated: 04-Aug-15 08:31 by Angelena Form (MD)

## 2014-12-05 NOTE — Consult Note (Signed)
PATIENT NAME:  Marissa [name], RAMMEL MR#:  [id number] DATE OF BIRTH:  03-05-1976  DATE OF CONSULTATION:  03/18/2014  REFERRING PHYSICIAN:  Dr. Netty Starring. CONSULTING PHYSICIAN:  Isaias Cowman, MD  CHIEF COMPLAINT: Shortness of breath.   HISTORY OF PRESENT ILLNESS: The patient is a 39 year old female referred for evaluation of congestive heart failure and dilated cardiomyopathy. The patient, apparently, was in her usual state of health until she was recently hospitalized at [facility] with complications from the lumbar back surgery, with apparent bacteremia, with prolonged intravenous antibiotic therapy. She was recently hospitalized 03/03/2014, with diagnosis of hospital-acquired pneumonia and was discharged home. She returns on 03/13/2014 with progressive shortness of breath and intractable nausea and vomiting. Chest x-ray did reveal evidence for a right middle lobe and right lower lobe pneumonia. The patient was seen by Dr. Ola Spurr in consultation. An echocardiogram was performed to rule out endocarditis. Echocardiogram revealed severe dilated cardiomyopathy with LVEF less than 20% without significant valvular abnormalities. The patient reports that she was diagnosed with myocardial infarction in 2006 while living in [place], and was told that she did not have significant coronary disease or heart problems at bedtime.   PAST MEDICAL HISTORY:  1.  Myocardial infarction in 2006.  2.  Hypertension.  3.  Hyperlipidemia.  4.  Diabetes.  5.  Diabetic neuropathy.  6.  Status post surgery of lumbar spine with postoperative infectious complications requiring prolonged intravenous antibiotics.   SOCIAL HISTORY: The patient is a widow. She has no children. She lives by herself. She denies tobacco or EtOH abuse.   FAMILY HISTORY: No immediate family history of coronary artery disease or myocardial infarction.   REVIEW OF SYSTEMS:  CONSTITUTIONAL: The patient has mild fever and chills.   EYES: No blurry vision.  EARS: No hearing loss.  RESPIRATORY: Shortness of breath as described above.  CARDIOVASCULAR: No chest pain.  GASTROINTESTINAL: The patient has had nausea and vomiting.  GENITOURINARY: No dysuria or hematuria.  ENDOCRINE: No polyuria or polydipsia.  MUSCULOSKELETAL: No arthralgias or myalgias.  NEUROLOGICAL: No focal muscle weakness or numbness.  PSYCHOLOGICAL: No depression or anxiety.   PHYSICAL EXAMINATION:  VITAL SIGNS: Blood pressure is 147/92, pulse 79, respirations 17, temperature 98.1, pulse oximetry 94%.  HEENT: Pupils equal and reactive to light and accommodation.  NECK: Supple without thyromegaly.  LUNGS: Decreased breath sounds in both bases.  HEART: Normal JVP. Diffuse PMI. Regular rate and rhythm. Normal S1, S2. No appreciable gallop, murmur, or rub.  ABDOMEN: Soft and nontender.  EXTREMITIES: There was 1+ bilateral pedal edema.  MUSCULOSKELETAL: Normal muscle tone.  NEUROLOGIC: The patient is alert and oriented x 3. Motor and sensory both grossly intact.   IMPRESSION: A 39 year old female with low back surgery complicated by bacteremia, requiring prolonged intravenous antibiotics with recent hospitalization for hospital-acquired pneumonia, now presents with progressive and recurrent shortness of breath, found to have dilated cardiomyopathy and probable acute systolic congestive heart failure.   RECOMMENDATIONS:  1.  Agree with overall current therapy.  2.  Continue carvedilol.  3.  Add isosorbide mononitrate 60 mg daily. 4.  Add hydralazine 25 mg q. 8.  5.  We will defer starting ACE inhibitor at this time secondary to mild elevation in BUN and creatinine since admission.   6.  Start diuresis with furosemide 20 mg q. 12.  7.  Further recommendations pending the patient's initial clinical course.   ____________________________ Isaias Cowman, MD ap:ts D: 03/18/2014 13:21:14 ET T: 03/18/2014 13:50:40 ET JOB#: [id number]  cc:  Isaias Cowman, MD, <Dictator> Isaias Cowman MD ELECTRONICALLY SIGNED 03/24/2014 13:56

## 2014-12-05 NOTE — Discharge Summary (Signed)
PATIENT NAME:  Marissa [name], Marissa [name] MR#:  [id number] DATE OF BIRTH:  [birth year]  DATE OF ADMISSION:  02/08/2014. DATE OF DISCHARGE:  02/11/2014  DISCHARGE DIAGNOSES:  1.  Intractable nausea and vomiting, which is improving.  2.  Acute renal failure secondary to acute tubular necrosis.  3.  Insulin-dependent diabetes.  4.  Acute on chronic anemia.   DISCHARGE MEDICATIONS:  1.  Simvastatin 40 mg p.o. daily at bedtime.  2.  Victoza 1.8 mg subcutaneous daily.  3.  Amitriptyline 50 mg p.o. at bedtime.  4.  NovoLog 3 times a day per sliding scale with meals.  5.  Topamax 100 mg tablets 1.5 tablets p.o. at bedtime.  6.  Imitrex 50 mg as needed for headache.  7.  Oxycodone 50 mg 0.5-1 tablet every 4 to 6 hours as needed for pain.  8.  Zanaflex 4 mg b.i.d. as needed.  9.  Coreg 12.5 mg p.o. b.i.d.  10. Gabapentin 300 mg 2 capsules t.i.d.  11. Acetaminophen 325 mg 2 tablets p.o. q.4 hours as needed for pain and fever.  12. Zofran 4 mg ODT 1 tablet every 6 hours as needed for nausea and vomiting.  13. Ceftriaxone 1 gram q.24 hours per PICC line.   CONSULTS:  Nephrology.  PROCEDURES:  None.   PERTINENT LABORATORY AND STUDIES:  On the day of discharge, sodium 143, potassium 4.3, creatinine 2.27, BUN 8, glucose 277. White blood cell count 6.9, hemoglobin 8, and platelets 182,000.   BRIEF HOSPITAL COURSE:  1.  Intractable nausea and vomiting. The patient initially came in with complaints of intractable nausea, vomiting with history of recent surgery of her back with her complications requiring ongoing IV antibiotics through her PICC line. She was placed on n.p.o., given IV fluids and given Zofran. Her symptoms seemed to improve. CT of the abdomen was negative for acute pancreatitis.  Ultrasound of the abdomen did show some gallstones but no acute cholecystitis. Unclear etiology at this point but no signs of infection with normal white blood cell count and afebrile with regards to her nausea and  vomiting.  2.  Acute renal failure. She has acute renal failure secondary to recent hospitalization requiring surgery and antibiotic therapy. Creatinine has trended down 2.27 today. She is making urine and is asymptomatic.  3.  Insulin-dependent diabetes: Continues to fluctuate. She will continue on her home regimen.  4.  Acute on chronic anemia, likely exacerbated by her recent acute renal failure, but some of this may be dilutional as she is stabilized at this time. We will need to follow this as an outpatient.   DISPOSITION:  The patient is in stable condition and will be discharged to home with home health nursing to help with the PICC and IV antibiotics. She will follow up with me in the clinic in 1 week.    ____________________________ [name], MD kl:lt D: 02/11/2014 08:09:15 ET T: 02/11/2014 08:31:54 ET JOB#: [id number]  cc: [name], MD, <Dictator> [name] MD ELECTRONICALLY SIGNED 02/16/2014 7:52

## 2014-12-05 NOTE — Discharge Summary (Signed)
PATIENT NAME:  Marissa [name], UPSHAW MR#:  [id number] DATE OF BIRTH:  1976-01-12  DATE OF ADMISSION:  03/13/2014 DATE OF DISCHARGE:  03/19/2014  DISCHARGE DIAGNOSES: 1.  Acute systolic congestive heart failure with an ejection fraction of less than 20%.  2.  Chronic antibiotic therapy status post back surgery with infectious complications.  3.  Insulin-dependent diabetes.  4.  History of pneumonia.   DISCHARGE MEDICATIONS: 1.  Simvastatin 40 mg p.o. at bedtime.  2.  Victoza 18 mg/3 mL, 1.8 mL subcutaneous injection once a day.  3.  Amitriptyline 50 mg p.o. at bedtime.  4.  NovoLog 3 units t.i.d. prior to meals.  5.  Topamax 100 mg 1-1/2 tablets p.o. at bedtime.  6.  Imitrex 50 mg as needed for headaches.  7.  Gabapentin 300 mg 2 capsules p.o. t.i.d.  8.  Oxycodone 15 mg 1 every 6-8 hours as needed for severe back pain.  9.  Rocephin 1 gram injectable powder. The patient is to have 2 grams IV every 24 hours.  10.  Vancomycin 1 grams IV b.i.d. per PICC.  11.  Acetaminophen 325 mg 2 tablets  q. 4  hours  as needed for pain and fever.  12.  Lisinopril 2.5 mg daily.  13.  Imdur 60 mg extended release 1 tablet daily.  14.  Hydralazine 25 mg p.o. q.i.d., hold if blood pressure is less than 100/50.   CONSULTS: Cardiology.   PROCEDURES: The patient had an echocardiogram that did show EF  less than 20%.   PERTINENT LABORATORY AND STUDIES: On day of discharge, sodium 141, potassium 3.6, creatinine 1.2, glucose of 228.  Again, echo showed EF  of less than 20%. Prior to discharge, white blood cell count was 6, hemoglobin 8.4, platelets of 163. Chest x-ray and CT showed no changes from previous infiltrate on the left side.   BRIEF HOSPITAL COURSE:  1.  Acute systolic congestive heart failure. The patient initially came in with worsening dyspnea and weakness thought to be related to her infectious process, but after further evaluation, it was noted that she has a new onset of congestive heart failure  that is systolic in nature with an EF of less than 20%. Unclear when this actually started, but she was evaluated by cardiology who did start her on an ACE inhibitor as well as Imdur and hydralazine. She was already on a beta blocker. Will continue this for now. Plan for Dr. Saralyn Pilar is to re-evaluate as an outpatient, possibly repeat an echo in a few months.  2.  Chronic antibiotic therapy status post surgery for infectious complications. She is to continue with the IV vancomycin and IV ceftriaxone as described above. I have asked her to follow up with neurosurgery to discuss treatment and duration of treatment.  Other chronic issues are stable at this time.   DISPOSITION: She is in stable condition and will be discharged to home and will be followed by home health nursing.  TOTAL TIME FOR DISCHARGE:  Was greater than 30 minutes.   ____________________________ [name], MD kl:ds D: 03/19/2014 13:27:40 ET T: 03/19/2014 18:36:13 ET JOB#: [id number]  cc: [name], MD, <Dictator> [name] MD ELECTRONICALLY SIGNED 04/13/2014 8:24

## 2014-12-05 NOTE — H&P (Signed)
PATIENT NAME:  Marissa [name], Marissa [name] MR#:  [id number] DATE OF BIRTH:  03/28/1976  DATE OF ADMISSION:  03/13/2014  PRIMARY CARE PHYSICIAN: Dr. Netty Starring.   CHIEF COMPLAINT: Shortness of breath and cough.   HISTORY OF PRESENT ILLNESS: This is a 39 year old female who presents to the hospital due to shortness of breath and cough and also intractable nausea, vomiting, now ongoing for the past few days. The patient was just recently discharged from the hospital 5 days ago where she was diagnosed with pneumonia, and she was discharged on oral doxycycline. She is also taking vancomycin and ceftriaxone IV through a PICC line, as she has had postoperative infections from her back surgery. She now returns as her shortness of breath and cough is getting worse, and she is having intractable nausea and vomiting. She was noted to be hypertensive and also noted to be hypokalemic in the Emergency Room. Her chest x-ray shows a right middle lobe and lower lobe pneumonia, and therefore hospitalist services were consulted for further treatment and evaluation.   REVIEW OF SYSTEMS:  CONSTITUTIONAL: Positive low-grade fever at home of 99 to 100. No weight gain, no weight loss. Positive fatigue and weakness.  EYES: No blurred or double vision.  ENT: No tinnitus. No postnasal drip. No redness of the oropharynx.  RESPIRATORY: Positive cough. No wheeze. No hemoptysis. Positive dyspnea.  CARDIOVASCULAR: Positive pleuritic chest pain. No orthopnea. No palpitations or syncope.  GASTROINTESTINAL: Positive nausea. Positive vomiting. No abdominal pain. No melena or hematochezia.  GENITOURINARY: No dysuria or hematuria.  ENDOCRINE: No polyuria, nocturia, heat or cold intolerance.  HEMATOLOGIC: No anemia. No bruising. No bleeding.  INTEGUMENTARY: No rashes. No lesions.  MUSCULOSKELETAL: No arthritis. No swelling. No gout.  NEUROLOGIC: No numbness or tingling. No ataxia. No seizure-type activity.  PSYCHIATRIC: No anxiety. No  insomnia. No ADD.   PAST MEDICAL HISTORY:  1.  Consistent with chronic back pain status post surgery with postoperative complications.  2.  Diabetes.  3.  Diabetic neuropathy.  4.  Hypertension.  5.  Hyperlipidemia.   ALLERGIES: CIPROFLOXACIN, ZITHROMAX AND GREEK YOGURT.   SOCIAL HISTORY:  No history of smoking. No alcohol abuse. No illicit drug abuse. Lives by herself.   FAMILY HISTORY: Mother and father both deceased. Father died from pneumonia. Mother died from a stroke.   CURRENT MEDICATIONS: Are as follows: Amitriptyline 50 mg at bedtime, Coreg 12.5 mg b.i.d., doxycycline 100 mg b.i.d., gabapentin 300 mg 2 caps t.i.d., Imitrex 50 mg daily as needed for headache, NovoLog t.i.d. as per sliding scale, oxycodone 15 mg every 6 to 8 hours as needed, potassium 20 mEq b.i.d., Rocephin 2 grams daily, simvastatin 40 mg daily, Topamax 150 mg at bedtime, vancomycin 1 gram twice daily, Victoza 1.8 mL subQ daily, Zanaflex 4 mg 1 to 2 tabs t.i.d. as needed.   PHYSICAL EXAMINATION: Presently is as follows:  VITAL SIGNS:  Are noted to be: Temperature is 98.4, pulse 96, respirations 18, blood pressure 162/99, sats 98% on 2 liters is nasal cannula.  GENERAL: She is a pleasant-appearing female in mild respiratory distress.  HEAD, EYES, EARS, NOSE, THROAT: Atraumatic, normocephalic. Extraocular muscles are intact. Pupils equal and reactive to light. Sclerae anicteric. No conjunctival injection. No pharyngeal erythema.  NECK: Supple. No jugular venous distention. No bruits. No lymphadenopathy or thyromegaly.  HEART: Regular rate and rhythm. No murmurs. No rubs. No clicks.  LUNGS: Clear to auscultation bilaterally. No rales or rhonchi. No wheezes.  ABDOMEN: Soft, flat, nontender, nondistended. Has good bowel sounds.  No hepatosplenomegaly appreciated.  EXTREMITIES: No evidence of any cyanosis, clubbing or peripheral edema. Has +2 pedal and radial pulses bilaterally.  NEUROLOGICAL: The patient is alert, awake  and oriented x 3 with no focal motor or sensory deficits bilaterally.  SKIN: Moist and warm with no rashes appreciated.  LYMPHATIC: There is no cervical or axillary lymphadenopathy.   LABORATORY DATA: Showed a serum glucose of 140, BUN 6, creatinine 0.96, sodium 144, potassium 2.8, chloride 109, bicarb 24. LFTs within normal limits. Troponin less than 0.02. White cell count 8.8, hemoglobin 10.1, hematocrit 30.2, platelet count 241.   The patient did have a chest x-ray done, which showed mild patchy lingular and right middle lobe opacities; atelectasis versus pneumonia.   ASSESSMENT AND PLAN: This is a 39 year old female with history of chronic back pain status post surgery and postoperative complications with infection; diabetes, hypertension, hyperlipidemia, diabetic neuropathy, presents to the hospital due to shortness of breath and cough. Also noted to have intractable nausea and vomiting for the past few days and noted to be hypokalemic.  1.  Shortness of breath and cough. I suspect this is probably related to pneumonia. Her chest x-ray shows a right middle lobe and lower lobe pneumonia. I do not think that this is a new pneumonia compared to the one that she had when she was recently hospitalized, although her symptoms are worse. She has no white count and she is afebrile. For now, I will treat her for hospital-acquired pneumonia given her recent hospitalization. I will treat her with IV vancomycin and Zosyn. Follow blood and sputum cultures and follow her clinically.  2.  Hypokalemia. This is secondary to intractable nausea, vomiting. I will go ahead and replace her potassium accordingly and follow it in the morning. We will check a magnesium level.  3.  Diabetes. Continue with her sliding-scale insulin coverage.  4.  Diabetic neuropathy. Continue Neurontin.  5.  Chronic back pain. Continue oxycodone.  6.  Hyperlipidemia. Continue simvastatin.   The patient is a FULL CODE.   The patient will  be transferred over to Dr. Raylene Miyamoto service.   Time spent is 50 minutes.    ____________________________ Belia Heman. Verdell Carmine, MD vjs:dmm D: 03/13/2014 20:44:57 ET T: 03/13/2014 21:19:42 ET JOB#: [id number]  cc: Belia Heman. Verdell Carmine, MD, <Dictator> Henreitta Leber MD ELECTRONICALLY SIGNED 03/27/2014 14:28

## 2014-12-18 DIAGNOSIS — M5137 Other intervertebral disc degeneration, lumbosacral region: Secondary | ICD-10-CM | POA: Insufficient documentation

## 2014-12-18 DIAGNOSIS — M5134 Other intervertebral disc degeneration, thoracic region: Secondary | ICD-10-CM | POA: Insufficient documentation

## 2014-12-18 DIAGNOSIS — E1340 Other specified diabetes mellitus with diabetic neuropathy, unspecified: Secondary | ICD-10-CM | POA: Insufficient documentation

## 2014-12-18 DIAGNOSIS — M5481 Occipital neuralgia: Secondary | ICD-10-CM | POA: Insufficient documentation

## 2014-12-18 DIAGNOSIS — G43909 Migraine, unspecified, not intractable, without status migrainosus: Secondary | ICD-10-CM | POA: Insufficient documentation

## 2014-12-18 DIAGNOSIS — M5030 Other cervical disc degeneration, unspecified cervical region: Secondary | ICD-10-CM | POA: Insufficient documentation

## 2014-12-18 NOTE — Patient Instructions (Addendum)
Continue present medications.  F/U Dr Richarda Overlie for eval  Of BP DM and general condition. Pt informed of elevated BP.  F/U Dr [name] for neurosurgical evaL  Orthopedic eval of shoulder as planned   F/U cardiologist  May consider radiofrequency rhizolysis, intraspinal implantation, and other procedures  Patient to call [facility] for any concerns prior to scheduled appointment.

## 2014-12-21 ENCOUNTER — Encounter: Payer: Self-pay | Admitting: Pain Medicine

## 2014-12-21 ENCOUNTER — Ambulatory Visit: Payer: PPO | Attending: Pain Medicine | Admitting: Pain Medicine

## 2014-12-21 VITALS — BP 160/101 | HR 96 | Temp 97.8°F | Resp 16 | Ht 65.0 in | Wt 214.0 lb

## 2014-12-21 DIAGNOSIS — M5134 Other intervertebral disc degeneration, thoracic region: Secondary | ICD-10-CM

## 2014-12-21 DIAGNOSIS — M5030 Other cervical disc degeneration, unspecified cervical region: Secondary | ICD-10-CM

## 2014-12-21 DIAGNOSIS — M5126 Other intervertebral disc displacement, lumbar region: Secondary | ICD-10-CM | POA: Diagnosis not present

## 2014-12-21 DIAGNOSIS — M4806 Spinal stenosis, lumbar region: Secondary | ICD-10-CM | POA: Diagnosis not present

## 2014-12-21 DIAGNOSIS — R51 Headache: Secondary | ICD-10-CM | POA: Insufficient documentation

## 2014-12-21 DIAGNOSIS — M542 Cervicalgia: Secondary | ICD-10-CM | POA: Insufficient documentation

## 2014-12-21 DIAGNOSIS — M5481 Occipital neuralgia: Secondary | ICD-10-CM

## 2014-12-21 DIAGNOSIS — M5137 Other intervertebral disc degeneration, lumbosacral region: Secondary | ICD-10-CM

## 2014-12-21 DIAGNOSIS — E1340 Other specified diabetes mellitus with diabetic neuropathy, unspecified: Secondary | ICD-10-CM

## 2014-12-21 MED ORDER — OXYCODONE HCL 15 MG PO TABS: ORAL_TABLET | ORAL | Status: DC

## 2014-12-21 MED ORDER — TIZANIDINE HCL 4 MG PO TABS: ORAL_TABLET | ORAL | Status: DC

## 2014-12-21 NOTE — Progress Notes (Signed)
   Subjective:    Patient ID: Marissa [name], female    DOB: 01/08/1976, 39 y.o.   MRN: [id number]  HPI  Patient with severe pain of the cervical and upper extremity regions especially the left shoulder patient with pain of the lumbar and lower extremity regions as well. The patient is with prior surgery of the cervical and lumbar regions. Patient also with significant medical conditions including diabetes mellitus and significant cardiac condition. At the present time we will avoid interventional treatment and will have patient follow up with orthopedic surgeon for further evaluation of the left shoulder we will consider interventional treatment pending disposition of the surgeon. The patient is understanding and in agreement with suggested treatment plan  Review of Systems     Objective:   Physical Exam Physical examination revealed patient to be with tenderness of the paraspinal musculature region of the cervical region and the thoracic region with tenderness of the splenius capitis and occipitalis musculature regions. There was tenderness over the thoracic facet region and lumbar paraspinal musculature regions with extension and palpation over the lumbar facets reproducing severe discomfort. Straight leg raising was tolerated to approximately 20 without increased pain with dorsiflexion and EHL strength was decreased significantly on the left compared to the right. There was questionable decreased sensation of the L5 dermatomal distribution. Abdomen was soft without tenderness to palpation no costovertebral angle tenderness was noted.       Assessment & Plan:  Patient with cervical upper extremity pain lumbar lower extremity pain with surgery of the cervical and lumbar regions MRI has revealed patient to be with moderate right spurring at L1-2 contributing to right lateral recess and right foraminal stenosis. Mild bilateral lateral recess stenosis at L2-3. Far lateral spurring changes on the  right at L3-4 with possible impingement of the extraforaminal L3 nerve root and bilateral lateral stenosis at L4-5 with shallow extraforaminal disc protrusion on the right which could affect the right L5 nerve root. There is concern regarding intraspinal abnormalities as well as diabetic neuropathy contributing to patient's symptomatology. Patient with headaches due to greater occipital neuralgia and myofascial pain related headaches, migraine headaches cervicogenic headaches. Patient with intra-articular abnormalities of the left shoulder to undergo further surgical evaluation we will avoid interventional treatment at this time  Plan #1 we will continue oxycodone and Zanaflex #2 patient is to follow-up with Dr. Richarda Overlie  regarding blood pressure and general medical condition #3 surgical evaluation of shoulder as discussed #4 neurosurgical evaluation with Dr. Phillip Heal as discussed Continue present medications.  F/U PCP for evaliation of  BP and general medical  Condition.  F/U surgical evaluation.  F/U nrurological evaluation.  May consider radiofrequency rhizolysis or intraspinal procedures pending response to present treatment and F/U evaluation.  Patient to call [facility] should patient have concerns prior to scheduled return appointment.

## 2014-12-21 NOTE — Progress Notes (Signed)
   Subjective:    Patient ID: Marissa [name], female    DOB: September 09, 1975, 39 y.o.   MRN: [id number]  HPI    Review of Systems     Objective:   Physical Exam        Assessment & Plan:

## 2014-12-21 NOTE — Progress Notes (Signed)
   Subjective:    Patient ID: Marissa [name], female    DOB: August 20, 1975, 39 y.o.   MRN: [id number]  HPI    Review of Systems     Objective:   Physical Exam        Assessment & Plan:

## 2014-12-21 NOTE — Progress Notes (Signed)
Patient discharged ambualtory at 1000. Script for oxycodone given as ordered. Patient to return in 1 month for med refill.

## 2015-01-04 ENCOUNTER — Observation Stay: Payer: PPO

## 2015-01-04 ENCOUNTER — Encounter: Payer: Self-pay | Admitting: Internal Medicine

## 2015-01-04 ENCOUNTER — Emergency Department: Payer: PPO

## 2015-01-04 ENCOUNTER — Observation Stay
Admission: EM | Admit: 2015-01-04 | Discharge: 2015-01-04 | Discharge status: Against Medical Advice | Payer: PPO | Attending: Internal Medicine | Admitting: Internal Medicine

## 2015-01-04 DIAGNOSIS — E780 Pure hypercholesterolemia: Secondary | ICD-10-CM | POA: Diagnosis not present

## 2015-01-04 DIAGNOSIS — Z833 Family history of diabetes mellitus: Secondary | ICD-10-CM | POA: Diagnosis not present

## 2015-01-04 DIAGNOSIS — G8929 Other chronic pain: Secondary | ICD-10-CM | POA: Diagnosis not present

## 2015-01-04 DIAGNOSIS — I2510 Atherosclerotic heart disease of native coronary artery without angina pectoris: Secondary | ICD-10-CM | POA: Diagnosis not present

## 2015-01-04 DIAGNOSIS — I252 Old myocardial infarction: Secondary | ICD-10-CM | POA: Insufficient documentation

## 2015-01-04 DIAGNOSIS — R109 Unspecified abdominal pain: Secondary | ICD-10-CM | POA: Diagnosis not present

## 2015-01-04 DIAGNOSIS — R531 Weakness: Secondary | ICD-10-CM | POA: Diagnosis present

## 2015-01-04 DIAGNOSIS — G43109 Migraine with aura, not intractable, without status migrainosus: Secondary | ICD-10-CM | POA: Insufficient documentation

## 2015-01-04 DIAGNOSIS — I420 Dilated cardiomyopathy: Secondary | ICD-10-CM | POA: Insufficient documentation

## 2015-01-04 DIAGNOSIS — H0520 Unspecified exophthalmos: Secondary | ICD-10-CM | POA: Diagnosis not present

## 2015-01-04 DIAGNOSIS — E1129 Type 2 diabetes mellitus with other diabetic kidney complication: Secondary | ICD-10-CM | POA: Diagnosis present

## 2015-01-04 DIAGNOSIS — G43909 Migraine, unspecified, not intractable, without status migrainosus: Secondary | ICD-10-CM | POA: Diagnosis not present

## 2015-01-04 DIAGNOSIS — R42 Dizziness and giddiness: Principal | ICD-10-CM

## 2015-01-04 DIAGNOSIS — R000 Tachycardia, unspecified: Secondary | ICD-10-CM | POA: Diagnosis not present

## 2015-01-04 DIAGNOSIS — G629 Polyneuropathy, unspecified: Secondary | ICD-10-CM | POA: Diagnosis not present

## 2015-01-04 DIAGNOSIS — M1990 Unspecified osteoarthritis, unspecified site: Secondary | ICD-10-CM | POA: Diagnosis not present

## 2015-01-04 DIAGNOSIS — Z8489 Family history of other specified conditions: Secondary | ICD-10-CM | POA: Diagnosis not present

## 2015-01-04 DIAGNOSIS — M5481 Occipital neuralgia: Secondary | ICD-10-CM | POA: Diagnosis not present

## 2015-01-04 DIAGNOSIS — M5137 Other intervertebral disc degeneration, lumbosacral region: Secondary | ICD-10-CM | POA: Diagnosis not present

## 2015-01-04 DIAGNOSIS — I10 Essential (primary) hypertension: Secondary | ICD-10-CM | POA: Diagnosis not present

## 2015-01-04 DIAGNOSIS — N179 Acute kidney failure, unspecified: Secondary | ICD-10-CM | POA: Diagnosis not present

## 2015-01-04 DIAGNOSIS — R519 Headache, unspecified: Secondary | ICD-10-CM

## 2015-01-04 DIAGNOSIS — Z91018 Allergy to other foods: Secondary | ICD-10-CM | POA: Insufficient documentation

## 2015-01-04 DIAGNOSIS — R4781 Slurred speech: Secondary | ICD-10-CM | POA: Diagnosis not present

## 2015-01-04 DIAGNOSIS — H5509 Other forms of nystagmus: Secondary | ICD-10-CM | POA: Diagnosis not present

## 2015-01-04 DIAGNOSIS — E1140 Type 2 diabetes mellitus with diabetic neuropathy, unspecified: Secondary | ICD-10-CM | POA: Insufficient documentation

## 2015-01-04 DIAGNOSIS — F329 Major depressive disorder, single episode, unspecified: Secondary | ICD-10-CM | POA: Diagnosis not present

## 2015-01-04 DIAGNOSIS — I429 Cardiomyopathy, unspecified: Secondary | ICD-10-CM | POA: Insufficient documentation

## 2015-01-04 DIAGNOSIS — H532 Diplopia: Secondary | ICD-10-CM | POA: Insufficient documentation

## 2015-01-04 DIAGNOSIS — E785 Hyperlipidemia, unspecified: Secondary | ICD-10-CM | POA: Diagnosis not present

## 2015-01-04 DIAGNOSIS — Z6834 Body mass index (BMI) 34.0-34.9, adult: Secondary | ICD-10-CM | POA: Insufficient documentation

## 2015-01-04 DIAGNOSIS — G459 Transient cerebral ischemic attack, unspecified: Secondary | ICD-10-CM

## 2015-01-04 DIAGNOSIS — F419 Anxiety disorder, unspecified: Secondary | ICD-10-CM | POA: Diagnosis not present

## 2015-01-04 DIAGNOSIS — R51 Headache: Secondary | ICD-10-CM

## 2015-01-04 DIAGNOSIS — Z881 Allergy status to other antibiotic agents status: Secondary | ICD-10-CM | POA: Diagnosis not present

## 2015-01-04 DIAGNOSIS — K219 Gastro-esophageal reflux disease without esophagitis: Secondary | ICD-10-CM | POA: Insufficient documentation

## 2015-01-04 DIAGNOSIS — E669 Obesity, unspecified: Secondary | ICD-10-CM | POA: Insufficient documentation

## 2015-01-04 HISTORY — DX: Long QT syndrome: I45.81

## 2015-01-04 LAB — CBC
HEMATOCRIT: 35.3 % (ref 35.0–47.0)
HEMOGLOBIN: 11.6 g/dL — AB (ref 12.0–16.0)
MCH: 28.3 pg (ref 26.0–34.0)
MCHC: 32.9 g/dL (ref 32.0–36.0)
MCV: 86.1 fL (ref 80.0–100.0)
PLATELETS: 150 10*3/uL (ref 150–440)
RBC: 4.1 MIL/uL (ref 3.80–5.20)
RDW: 15.8 % — ABNORMAL HIGH (ref 11.5–14.5)
WBC: 4.7 10*3/uL (ref 3.6–11.0)

## 2015-01-04 LAB — COMPREHENSIVE METABOLIC PANEL
ALT: 33 U/L (ref 14–54)
AST: 36 U/L (ref 15–41)
Albumin: 3.3 g/dL — ABNORMAL LOW (ref 3.5–5.0)
Alkaline Phosphatase: 98 U/L (ref 38–126)
Anion gap: 5 (ref 5–15)
BILIRUBIN TOTAL: 0.2 mg/dL — AB (ref 0.3–1.2)
BUN: 20 mg/dL (ref 6–20)
CO2: 24 mmol/L (ref 22–32)
CREATININE: 1.09 mg/dL — AB (ref 0.44–1.00)
Calcium: 8.3 mg/dL — ABNORMAL LOW (ref 8.9–10.3)
Chloride: 105 mmol/L (ref 101–111)
GFR calc Af Amer: >60 mL/min (ref >60)
GFR calc non Af Amer: >60 mL/min (ref >60)
Glucose, Bld: 249 mg/dL — ABNORMAL HIGH (ref 65–99)
POTASSIUM: 4.4 mmol/L (ref 3.5–5.1)
SODIUM: 134 mmol/L — AB (ref 135–145)
Total Protein: 6.8 g/dL (ref 6.5–8.1)

## 2015-01-04 LAB — GLUCOSE, CAPILLARY
Glucose-Capillary: 168 mg/dL — ABNORMAL HIGH (ref 65–99)
Glucose-Capillary: 143 mg/dL — ABNORMAL HIGH (ref 65–99)
Glucose-Capillary: 215 mg/dL — ABNORMAL HIGH (ref 65–99)

## 2015-01-04 LAB — TROPONIN I

## 2015-01-04 LAB — TSH: TSH: 1.887 u[IU]/mL (ref 0.350–4.500)

## 2015-01-04 MED ORDER — SODIUM CHLORIDE 0.9 % IV BOLUS (SEPSIS)
1000.0000 mL | Freq: Once | INTRAVENOUS | Status: AC
  Administered 2015-01-04: 1000 mL via INTRAVENOUS

## 2015-01-04 MED ORDER — AMITRIPTYLINE HCL 50 MG PO TABS
50.0000 mg | ORAL_TABLET | Freq: Every day | ORAL | Status: DC
  Administered 2015-01-04: 50 mg via ORAL
  Filled 2015-01-04: qty 1

## 2015-01-04 MED ORDER — SODIUM CHLORIDE 0.9 % IJ SOLN: 3.0000 mL | INTRAMUSCULAR | Status: DC | PRN

## 2015-01-04 MED ORDER — TOPIRAMATE 100 MG PO TABS
150.0000 mg | ORAL_TABLET | Freq: Every day | ORAL | Status: DC
  Administered 2015-01-04: 150 mg via ORAL
  Filled 2015-01-04: qty 2

## 2015-01-04 MED ORDER — CARVEDILOL 25 MG PO TABS
25.0000 mg | ORAL_TABLET | Freq: Two times a day (BID) | ORAL | Status: DC
  Administered 2015-01-04: 25 mg via ORAL
  Filled 2015-01-04: qty 1

## 2015-01-04 MED ORDER — ACETAMINOPHEN 325 MG PO TABS
650.0000 mg | ORAL_TABLET | Freq: Four times a day (QID) | ORAL | Status: DC | PRN
Start: 2015-01-04 — End: 2015-01-05

## 2015-01-04 MED ORDER — HEPARIN SODIUM (PORCINE) 5000 UNIT/ML IJ SOLN
5000.0000 [IU] | Freq: Three times a day (TID) | INTRAMUSCULAR | Status: DC
  Administered 2015-01-04 (×2): 5000 [IU] via SUBCUTANEOUS
  Filled 2015-01-04 (×2): qty 1

## 2015-01-04 MED ORDER — INSULIN ASPART 100 UNIT/ML ~~LOC~~ SOLN
0.0000 [IU] | Freq: Every day | SUBCUTANEOUS | Status: DC
  Administered 2015-01-04: 2 [IU] via SUBCUTANEOUS
  Filled 2015-01-04: qty 2

## 2015-01-04 MED ORDER — ACETAMINOPHEN 650 MG RE SUPP: 650.0000 mg | Freq: Four times a day (QID) | RECTAL | Status: DC | PRN

## 2015-01-04 MED ORDER — LISINOPRIL 10 MG PO TABS
10.0000 mg | ORAL_TABLET | Freq: Every day | ORAL | Status: DC
  Filled 2015-01-04: qty 1

## 2015-01-04 MED ORDER — DOCUSATE SODIUM 100 MG PO CAPS
100.0000 mg | ORAL_CAPSULE | Freq: Two times a day (BID) | ORAL | Status: DC
  Filled 2015-01-04 (×2): qty 1

## 2015-01-04 MED ORDER — SODIUM CHLORIDE 0.9 % IJ SOLN: 3.0000 mL | Freq: Two times a day (BID) | INTRAMUSCULAR | Status: DC

## 2015-01-04 MED ORDER — LIRAGLUTIDE 18 MG/3ML ~~LOC~~ SOPN
1.8000 mg | PEN_INJECTOR | Freq: Every day | SUBCUTANEOUS | Status: DC
Start: 2015-01-04 — End: 2015-01-05

## 2015-01-04 MED ORDER — NITROGLYCERIN 0.4 MG SL SUBL: 0.4000 mg | SUBLINGUAL_TABLET | SUBLINGUAL | Status: DC | PRN

## 2015-01-04 MED ORDER — OXYCODONE HCL 5 MG PO TABS
15.0000 mg | ORAL_TABLET | Freq: Four times a day (QID) | ORAL | Status: DC | PRN
  Administered 2015-01-04 (×2): 15 mg via ORAL
  Filled 2015-01-04 (×2): qty 3

## 2015-01-04 MED ORDER — AMITRIPTYLINE HCL 50 MG PO TABS: 100.0000 mg | ORAL_TABLET | Freq: Every day | ORAL | Status: DC

## 2015-01-04 MED ORDER — SUMATRIPTAN SUCCINATE 50 MG PO TABS: 50.0000 mg | ORAL_TABLET | ORAL | Status: DC | PRN

## 2015-01-04 MED ORDER — ROSUVASTATIN CALCIUM 20 MG PO TABS: 20.0000 mg | ORAL_TABLET | Freq: Every day | ORAL | Status: DC

## 2015-01-04 MED ORDER — INSULIN ASPART 100 UNIT/ML ~~LOC~~ SOLN
10.0000 [IU] | Freq: Three times a day (TID) | SUBCUTANEOUS | Status: DC
Start: 2015-01-04 — End: 2015-01-04

## 2015-01-04 MED ORDER — MECLIZINE HCL 25 MG PO TABS
12.5000 mg | ORAL_TABLET | Freq: Two times a day (BID) | ORAL | Status: DC
  Administered 2015-01-04 (×2): 12.5 mg via ORAL
  Filled 2015-01-04 (×2): qty 1

## 2015-01-04 MED ORDER — TIZANIDINE HCL 4 MG PO TABS: 4.0000 mg | ORAL_TABLET | Freq: Three times a day (TID) | ORAL | Status: DC | PRN

## 2015-01-04 MED ORDER — TOPIRAMATE 100 MG PO TABS
100.0000 mg | ORAL_TABLET | Freq: Every day | ORAL | Status: DC
  Administered 2015-01-04: 100 mg via ORAL
  Filled 2015-01-04: qty 1

## 2015-01-04 MED ORDER — INSULIN GLARGINE 100 UNIT/ML ~~LOC~~ SOLN: 10.0000 [IU] | Freq: Every day | SUBCUTANEOUS | Status: DC

## 2015-01-04 MED ORDER — SIMVASTATIN 40 MG PO TABS: 40.0000 mg | ORAL_TABLET | Freq: Every day | ORAL | Status: DC

## 2015-01-04 MED ORDER — ASPIRIN 81 MG PO CHEW
324.0000 mg | CHEWABLE_TABLET | Freq: Once | ORAL | Status: AC
  Administered 2015-01-04: 324 mg via ORAL

## 2015-01-04 MED ORDER — INSULIN GLARGINE 100 UNIT/ML ~~LOC~~ SOLN
16.0000 [IU] | Freq: Every day | SUBCUTANEOUS | Status: DC
  Administered 2015-01-04: 16 [IU] via SUBCUTANEOUS
  Filled 2015-01-04: qty 0.16

## 2015-01-04 MED ORDER — GABAPENTIN 300 MG PO CAPS
600.0000 mg | ORAL_CAPSULE | Freq: Three times a day (TID) | ORAL | Status: DC
  Administered 2015-01-04 (×2): 600 mg via ORAL
  Filled 2015-01-04 (×2): qty 2

## 2015-01-04 MED ORDER — SPIRONOLACTONE 25 MG PO TABS
25.0000 mg | ORAL_TABLET | Freq: Every day | ORAL | Status: DC
  Filled 2015-01-04: qty 1

## 2015-01-04 MED ORDER — ASPIRIN 81 MG PO CHEW
CHEWABLE_TABLET | ORAL | Status: AC
  Administered 2015-01-04: 324 mg via ORAL
  Filled 2015-01-04: qty 4

## 2015-01-04 MED ORDER — METOCLOPRAMIDE HCL 10 MG PO TABS: 10.0000 mg | ORAL_TABLET | Freq: Four times a day (QID) | ORAL | Status: DC | PRN

## 2015-01-04 MED ORDER — INSULIN ASPART 100 UNIT/ML ~~LOC~~ SOLN
0.0000 [IU] | Freq: Three times a day (TID) | SUBCUTANEOUS | Status: DC
  Administered 2015-01-04: 3 [IU] via SUBCUTANEOUS
  Filled 2015-01-04: qty 1

## 2015-01-04 MED ORDER — ROSUVASTATIN CALCIUM 20 MG PO TABS
40.0000 mg | ORAL_TABLET | Freq: Every day | ORAL | Status: DC
  Administered 2015-01-04: 40 mg via ORAL
  Filled 2015-01-04: qty 2
  Filled 2015-01-04: qty 1

## 2015-01-04 MED ORDER — ASPIRIN EC 325 MG PO TBEC: 325.0000 mg | DELAYED_RELEASE_TABLET | Freq: Once | ORAL | Status: DC

## 2015-01-04 MED ORDER — PROCHLORPERAZINE MALEATE 10 MG PO TABS: 10.0000 mg | ORAL_TABLET | Freq: Three times a day (TID) | ORAL | Status: DC | PRN

## 2015-01-04 MED ORDER — OXYCODONE HCL 5 MG PO TABS: 5.0000 mg | ORAL_TABLET | ORAL | Status: DC | PRN

## 2015-01-04 NOTE — ED Notes (Signed)
Attempting to call report.  

## 2015-01-04 NOTE — Progress Notes (Signed)
Pt. admitted to unit. Oriented to room, call bell, Ascom phones and staff. Bed in low position, non skid socks and bed alarm on. Fall safety plan reviewed. Full assessment to Epic and care plan intiated. Will continue to monitor.

## 2015-01-04 NOTE — ED Notes (Signed)
Floor called to let them know we are on the way with pt.

## 2015-01-04 NOTE — ED Notes (Addendum)
Pt presents to ER with husband. Husband states pt has been "off balance" and slurred speech for 30 minutes. Pt has slurred speech noted, pt states she took her regular meds (muscle relaxer) tonight, states normal dose.

## 2015-01-04 NOTE — ED Notes (Signed)
Report to Brooke RN.

## 2015-01-04 NOTE — ED Notes (Signed)
Assumed care of pt. Pt speaking with mostly clear speech. Family at bedside.

## 2015-01-04 NOTE — Consult Note (Signed)
CC: dizziness    Marissa [name] is an 39 y.o. female patient presents emergency room complaining of an episode of dizziness. It began when she stood up after laying down in bed. She had gotten up because she felt her left leg cramping, but when she arose her vision became blurry and she fell backward onto the bed. She sustained no injury when she fell. Her boyfriend notes that her speech was "off". She denies any chest pain, shortness of breath, nausea or vomiting. In the emergency department the patient's head CT was normal. She admits to a frontal headache that is not unlike her migraines. Her physical exam revealed some left-sided weakness which the patient states is chronic.   HA resolved as well as the HA resolved.   Pt is s/p MRI which did not show any acute abnormalities.    Past Medical History  Diagnosis Date  . Hypertension   . Coronary artery disease   . Neuropathy   . Headache(784.0)   . Arthritis   . Vision loss     due to diabetes  . Myocardial infarction 2006    "due to medication"; no evidence of ischemia or infarction by nuclear stress test '11  . Dysrhythmia     tachycardia  . Diabetes mellitus without complication     fasting cbg 50-140s  . GERD (gastroesophageal reflux disease)     otc meds  . Migraines     once/month maybe - can last up to two weeks  . Hyperlipemia   . Cardiomyopathy, dilated, nonischemic   . MI, old     2006  . Neck pain, chronic   . Back pain, chronic   . Depression   . Anxiety   . Hypercholesteremia   . Allergy   . Anemia   . Prolonged QT interval syndrome     Past Surgical History  Procedure Laterality Date  . Back surgery  2011    Lumbar   . Cervical fusion  2006  . Salpingoophorectomy Bilateral     1 1997. 2nd 2001  . Cardiac catheterization  07/18/2004    50-60% mid LAD, minor luminal irregularities RCA, normal LM and CX, EF 50-55% [facility])  . Lumbar laminectomy/decompression microdiscectomy Right  05/07/2013    Procedure: Right Lumbar one-two laminectomy;  Surgeon: Elaina Hoops, MD;  Location: [place] [facility];  Service: Neurosurgery;  Laterality: Right;  . Carpel tunnel Bilateral   . Lumbar laminectomy/decompression microdiscectomy Left 10/29/2013    Procedure: Left Lumbar five-Sacral one Laminectomy;  Surgeon: Elaina Hoops, MD;  Location: [place] [facility];  Service: Neurosurgery;  Laterality: Left;  . Lumbar wound debridement N/A 01/25/2014    Procedure: LUMBAR WOUND DEBRIDEMENT;  Surgeon: Charlie Pitter, MD;  Location: [place] [facility];  Service: Neurosurgery;  Laterality: N/A;  . Lumbar wound debridement N/A 02/25/2014    Procedure: LUMBAR WOUND DEBRIDEMENT;  Surgeon: Elaina Hoops, MD;  Location: [place] [facility];  Service: Neurosurgery;  Laterality: N/A;    Family History  Problem Relation Age of Onset  . Depression Mother   . Drug abuse Mother   . Early death Mother   . Hypertension Mother   . Varicose Veins Mother   . Diabetes Father   . Early death Father   . Hyperlipidemia Father     Social History:  reports that she has never smoked. She does not have any smokeless tobacco history on file. She reports that she does not drink alcohol or  use illicit drugs.  Allergies  Allergen Reactions  . Bactrim [Sulfamethoxazole-Trimethoprim] Nausea And Vomiting  . Ciprofloxacin Swelling  . Rocephin [Ceftriaxone Sodium In Dextrose] Nausea And Vomiting  . Vancomycin Nausea And Vomiting  . Azithromycin Itching and Rash  . Food Itching and Rash    "[place] yogurt"    Medications: I have reviewed the patient's current medications.  ROS: History obtained from the patient  General ROS: negative for - chills, fatigue, fever, night sweats, weight gain or weight loss, positive for dizziness.  Psychological ROS: negative for - behavioral disorder, hallucinations, memory difficulties, mood swings or suicidal ideation Ophthalmic ROS: negative for - blurry vision, double vision, eye pain or loss of vision ENT  ROS: negative for - epistaxis, nasal discharge, oral lesions, sore throat, tinnitus or vertigo Allergy and Immunology ROS: negative for - hives or itchy/watery eyes Hematological and Lymphatic ROS: negative for - bleeding problems, bruising or swollen lymph nodes Endocrine ROS: negative for - galactorrhea, hair pattern changes, polydipsia/polyuria or temperature intolerance Respiratory ROS: negative for - cough, hemoptysis, shortness of breath or wheezing Cardiovascular ROS: negative for - chest pain, dyspnea on exertion, edema or irregular heartbeat Gastrointestinal ROS: negative for - abdominal pain, diarrhea, hematemesis, nausea/vomiting or stool incontinence Genito-Urinary ROS: negative for - dysuria, hematuria, incontinence or urinary frequency/urgency Musculoskeletal ROS: negative for - joint swelling or muscular weakness Neurological ROS: as noted in HPI Dermatological ROS: negative for rash and skin lesion changes  Physical Examination: Blood pressure 104/76, pulse 92, temperature 98 F (36.7 C), temperature source Oral, resp. rate 16, height 5' 4.5" (1.638 m), weight 98.204 kg (216 lb 8 oz), last menstrual period 12/21/2014, SpO2 100 %.    Neurological Examination Mental Status: Alert, oriented, thought content appropriate.  Speech fluent without evidence of aphasia.  Able to follow 3 step commands without difficulty. Cranial Nerves: II: Discs flat bilaterally; Visual fields grossly normal, pupils equal, round, reactive to light and accommodation III,IV, VI: ptosis not present, extra-ocular motions intact bilaterally V,VII: smile symmetric, facial light touch sensation normal bilaterally VIII: hearing normal bilaterally IX,X: gag reflex present XI: bilateral shoulder shrug XII: midline tongue extension Motor: Right : Upper extremity   5/5    Left:     Upper extremity   4/5  Lower extremity   5/5     Lower extremity   4/5 Tone and bulk:normal tone throughout; no atrophy  noted Sensory: Pinprick and light touch intact throughout, bilaterally Deep Tendon Reflexes: 2+ and symmetric throughout Plantars: Right: downgoing   Left: downgoing Cerebellar: normal finger-to-nose, normal rapid alternating movements and normal heel-to-shin test Gait: normal gait and station      Laboratory Studies:   Basic Metabolic Panel:  Recent Labs Lab 01/04/15 0233  NA 134*  K 4.4  CL 105  CO2 24  GLUCOSE 249*  BUN 20  CREATININE 1.09*  CALCIUM 8.3*    Liver Function Tests:  Recent Labs Lab 01/04/15 0233  AST 36  ALT 33  ALKPHOS 98  BILITOT 0.2*  PROT 6.8  ALBUMIN 3.3*   No results for input(s): LIPASE, AMYLASE in the last 168 hours. No results for input(s): AMMONIA in the last 168 hours.  CBC:  Recent Labs Lab 01/04/15 0233  WBC 4.7  HGB 11.6*  HCT 35.3  MCV 86.1  PLT 150    Cardiac Enzymes:  Recent Labs Lab 01/04/15 0233  TROPONINI <0.03    BNP: Invalid input(s): POCBNP  CBG:  Recent Labs Lab 01/04/15 1118 01/04/15 1725  GLUCAP 168* 143*    Microbiology: Results for orders placed or performed in visit on 03/13/14  Culture, blood (single)     Status: None   Collection Time: 03/13/14  6:48 PM  Result Value Ref Range Status   Micro Text Report   Final       COMMENT                   NO GROWTH AEROBICALLY/ANAEROBICALLY IN 5 DAYS   ANTIBIOTIC                                                        Coagulation Studies: No results for input(s): LABPROT, INR in the last 72 hours.  Urinalysis: No results for input(s): COLORURINE, LABSPEC, PHURINE, GLUCOSEU, HGBUR, BILIRUBINUR, KETONESUR, PROTEINUR, UROBILINOGEN, NITRITE, LEUKOCYTESUR in the last 168 hours.  Invalid input(s): APPERANCEUR  Lipid Panel:     Component Value Date/Time   CHOL 115 02/01/2014 0429   TRIG 117 02/01/2014 0429   HDL 25* 02/01/2014 0429   CHOLHDL 4.6 02/01/2014 0429   VLDL 23 02/01/2014 0429   LDLCALC 67 02/01/2014 0429    HgbA1C:  Lab  Results  Component Value Date   HGBA1C 9.5* 02/01/2014    Urine Drug Screen:  No results found for: LABOPIA, COCAINSCRNUR, LABBENZ, AMPHETMU, THCU, LABBARB  Alcohol Level: No results for input(s): ETH in the last 168 hours.  Other results: EKG: normal EKG, normal sinus rhythm, unchanged from previous tracings.  Imaging: Ct Head Wo Contrast  01/04/2015   CLINICAL DATA:  Dizziness, off balance with slurred speech for 30 minutes.  EXAM: CT HEAD WITHOUT CONTRAST  TECHNIQUE: Contiguous axial images were obtained from the base of the skull through the vertex without intravenous contrast.  COMPARISON:  01/30/2014  FINDINGS: Skull and Sinuses:Negative for fracture or destructive process. The mastoids, middle ears, and imaged paranasal sinuses are clear.  Orbits: Dysconjugate gaze, nonspecific and also seen previously.  Brain: No evidence of acute infarction, hemorrhage, hydrocephalus, or mass lesion/mass effect.  IMPRESSION: Negative head CT.   Electronically Signed   By: Monte Fantasia M.D.   On: 01/04/2015 02:47   Dg Chest Portable 1 View  01/04/2015   CLINICAL DATA:  Dizziness.  EXAM: PORTABLE CHEST - 1 VIEW  COMPARISON:  03/15/2014  FINDINGS: Normal heart size and mediastinal contours. No acute infiltrate or edema. No effusion or pneumothorax. No acute osseous findings.  IMPRESSION: No active disease.   Electronically Signed   By: Monte Fantasia M.D.   On: 01/04/2015 03:10     Assessment/Plan: 39 y/o with hx of dizziness that has resolved. On admission pt had a headache which is her usual type of a headache. Now back to baseline. Was on B blocker for prophylaxis and Imitrex for acute headaches. Prelim MRI no acute intracranial abnormalities  Plan: - d/c planning in AM - daily 500mg  Magnesium and Vitamin B complex  - pt has hx of cardiomyopathy. Would not start either B blocker or imitrex - She does see Dr. Melrose Nakayama as out pt, would follow up with him in 2 weeks.  Leotis Pain   01/04/2015, 5:49 PM

## 2015-01-04 NOTE — Progress Notes (Signed)
[place] [facility] [place] NAME: Marissa [name]    MR#:  [id number]  DATE OF BIRTH:  11-Dec-1975  SUBJECTIVE:   No specific complaints. Feels slightly confused  REVIEW OF SYSTEMS:   Review of Systems  Constitutional: Negative for fever, chills, weight loss and malaise/fatigue.  HENT: Positive for sore throat. Negative for congestion and hearing loss.   Eyes: Positive for blurred vision. Negative for pain.  Respiratory: Negative for cough, hemoptysis, sputum production, shortness of breath and stridor.   Cardiovascular: Negative for chest pain, palpitations, orthopnea and leg swelling.  Gastrointestinal: Negative for nausea, vomiting, abdominal pain, diarrhea, constipation and blood in stool.  Genitourinary: Negative for dysuria and frequency.  Musculoskeletal: Negative for myalgias, back pain, joint pain and neck pain.  Skin: Negative for rash.  Neurological: Positive for dizziness, sensory change, speech change and headaches. Negative for focal weakness and loss of consciousness.  Endo/Heme/Allergies: Does not bruise/bleed easily.  Psychiatric/Behavioral: Negative for depression and hallucinations. The patient is not nervous/anxious.     DRUG ALLERGIES:   Allergies  Allergen Reactions  . Bactrim [Sulfamethoxazole-Trimethoprim] Nausea And Vomiting  . Ciprofloxacin Swelling  . Rocephin [Ceftriaxone Sodium In Dextrose] Nausea And Vomiting  . Vancomycin Nausea And Vomiting  . Azithromycin Itching and Rash  . Food Itching and Rash    "[place] yogurt"    VITALS:  Blood pressure 104/76, pulse 92, temperature 98 F (36.7 C), temperature source Oral, resp. rate 16, height 5' 4.5" (1.638 m), weight 98.204 kg (216 lb 8 oz), last menstrual period 12/21/2014, SpO2 100 %.  PHYSICAL EXAMINATION:  GENERAL:  39 y.o.-year-old patient lying in the bed with no acute distress.  EYES: Pupils equal, round, reactive to light and accommodation. No  scleral icterus. Extraocular muscles intact.  HEENT: Head atraumatic, normocephalic. Oropharynx and nasopharynx clear. Good dentition, moist mucous membranes NECK:  Supple, no jugular venous distention. No thyroid enlargement, no tenderness.  LUNGS: Normal breath sounds bilaterally, no wheezing, rales,rhonchi or crepitation. No use of accessory muscles of respiration.  CARDIOVASCULAR: S1, S2 normal. No murmurs, rubs, or gallops.  ABDOMEN: Soft, nontender, nondistended. Bowel sounds present. No organomegaly or mass.  EXTREMITIES: No pedal edema, cyanosis, or clubbing.  NEUROLOGIC: Cranial nerves II through XII are intact. Muscle strength 5/5 in all extremities. Sensation intact. Gait not checked. He seems to have some word finding difficulty, cannot remember events of this morning PSYCHIATRIC: The patient is alert and oriented x 3.  SKIN: No obvious rash, lesion, or ulcer.    LABORATORY PANEL:   CBC  Recent Labs Lab 01/04/15 0233  WBC 4.7  HGB 11.6*  HCT 35.3  PLT 150   ------------------------------------------------------------------------------------------------------------------  Chemistries   Recent Labs Lab 01/04/15 0233  NA 134*  K 4.4  CL 105  CO2 24  GLUCOSE 249*  BUN 20  CREATININE 1.09*  CALCIUM 8.3*  AST 36  ALT 33  ALKPHOS 98  BILITOT 0.2*   ------------------------------------------------------------------------------------------------------------------  Cardiac Enzymes  Recent Labs Lab 01/04/15 0233  TROPONINI <0.03   ------------------------------------------------------------------------------------------------------------------  RADIOLOGY:  Ct Head Wo Contrast  01/04/2015   CLINICAL DATA:  Dizziness, off balance with slurred speech for 30 minutes.  EXAM: CT HEAD WITHOUT CONTRAST  TECHNIQUE: Contiguous axial images were obtained from the base of the skull through the vertex without intravenous contrast.  COMPARISON:  01/30/2014  FINDINGS: Skull  and Sinuses:Negative for fracture or destructive process. The mastoids, middle ears, and imaged paranasal sinuses are clear.  Orbits:  Dysconjugate gaze, nonspecific and also seen previously.  Brain: No evidence of acute infarction, hemorrhage, hydrocephalus, or mass lesion/mass effect.  IMPRESSION: Negative head CT.   Electronically Signed   By: Monte Fantasia M.D.   On: 01/04/2015 02:47   Dg Chest Portable 1 View  01/04/2015   CLINICAL DATA:  Dizziness.  EXAM: PORTABLE CHEST - 1 VIEW  COMPARISON:  03/15/2014  FINDINGS: Normal heart size and mediastinal contours. No acute infiltrate or edema. No effusion or pneumothorax. No acute osseous findings.  IMPRESSION: No active disease.   Electronically Signed   By: Monte Fantasia M.D.   On: 01/04/2015 03:10    EKG:   Orders placed or performed during the hospital encounter of 01/04/15  . ED EKG  . ED EKG  . EKG 12-Lead  . EKG 12-Lead    ASSESSMENT AND PLAN:   This is a 39 year old female with multiple medical problems who is been admitted for dizziness.   1. Dizziness:  - Loss of balance, confusion, persistent word finding difficulty, slurred speech - CT scan negative, I will order an MRI - He does have a history of migraines, this could be an atypical migraine. May also be the result of narcotics, hypotension or other medication effect - Neurology consultation pending  2. Headache:  - ontinue scheduled topiramate and triptan therapy as needed.  3. Prolonged QT: - Continue telemetry  4. Coronary artery disease: Stable continue secondary prevention  5. Hypertension:  - Lightly hypotensive at this time, continue carvedilol hold spironolactone and lisinopril - She reports a very high blood pressures this morning upon waking during the time of her symptoms  6. Diabetes mellitus type 2:  - Check A1c, and continue sliding scale  7.. Chronic pain: The patient is on large amounts of narcotics. She sees pain clinic. Recommend decreasing  short acting narcotics and replacing with long-acting analgesics.  8. Obesity: BMI is 34.2; encourage healthy diet and exercise. 9. DVT prophylaxis: Heparin 10. GI prophylaxis: None  All the records are reviewed and case discussed with Care Management/Social Workerr. Management plans discussed with the patient, family and they are in agreement.  CODE STATUS: Full  TOTAL TIME TAKING CARE OF THIS PATIENT: 35 minutes.   POSSIBLE D/C IN 1 DAYS, DEPENDING ON CLINICAL CONDITION.   Myrtis Ser M.D on 01/04/2015 at 2:45 PM  Between 7am to 6pm - Pager - [phone]  After 6pm go to [email or web address] - password [email or web address] [facility]  Office  [phone]  CC: Primary care physician; [name], MD

## 2015-01-04 NOTE — ED Provider Notes (Signed)
[facility] Emergency Department Provider Note  ____________________________________________  Time seen: Approximately 2:20 AM  I have reviewed the triage vital signs and the nursing notes.   HISTORY  Chief Complaint Dizziness    HPI Marissa [name] is a 39 y.o. female who comes in tonight with dizziness. The patient reports that she was laying in bed with her husband when she went to stand up and fell back onto the bed. The patient reports that she felt dizzy. The patient reports that she checked her blood sugar which was 190 so she checked her blood pressure and reports that it would not read. The patient reports that she felt it was high. The patient tried to get up a few more times but again had some difficulty. She reports that her vision was blurry and she had double vision. The patient reports that she put drops in her eyes but that also did not help. She reports that this began approximately 30 minutes prior to her arrival to the emergency department. The patient reports that she also does have a headache and does not feel right. The patient was brought in by her husband for further evaluation.   Past Medical History  Diagnosis Date  . Hypertension   . Coronary artery disease   . Neuropathy   . Headache(784.0)   . Arthritis   . Vision loss     due to diabetes  . Myocardial infarction 2006    "due to medication"; no evidence of ischemia or infarction by nuclear stress test '11  . Dysrhythmia     tachycardia  . Diabetes mellitus without complication     fasting cbg 50-140s  . GERD (gastroesophageal reflux disease)     otc meds  . Migraines     once/month maybe - can last up to two weeks  . Hyperlipemia   . Cardiomyopathy, dilated, nonischemic   . MI, old     2006  . Neck pain, chronic   . Back pain, chronic   . Depression   . Anxiety   . Hypercholesteremia   . Allergy   . Anemia     Patient Active Problem List   Diagnosis Date Noted   . DDD (degenerative disc disease), cervical 12/18/2014  . DDD (degenerative disc disease), thoracic 12/18/2014  . DDD (degenerative disc disease), lumbosacral 12/18/2014  . Neuropathy due to secondary diabetes 12/18/2014  . Migraine 12/18/2014  . Occipital neuralgia 12/18/2014  . Wound infection complicating hardware 27/10/5007  . Nausea alone 02/01/2014  . Abdominal pain, unspecified site 02/01/2014  . Severe sepsis(995.92) 01/30/2014  . History of lumbar laminectomy 01/30/2014  . Acute renal failure 01/30/2014  . Obesity 01/30/2014  . Anemia 01/30/2014  . HTN (hypertension) 01/30/2014  . DM (diabetes mellitus), type 2 with renal complications 38/18/2993  . Wound infection 01/25/2014  . Acute respiratory failure with hypoxia 01/25/2014  . Back pain 11/01/2013  . HNP (herniated nucleus pulposus), lumbar 10/29/2013    Past Surgical History  Procedure Laterality Date  . Back surgery  2011    Lumbar   . Cervical fusion  2006  . Salpingoophorectomy Bilateral     1 1997. 2nd 2001  . Cardiac catheterization  07/18/2004    50-60% mid LAD, minor luminal irregularities RCA, normal LM and CX, EF 50-55% [facility])  . Lumbar laminectomy/decompression microdiscectomy Right 05/07/2013    Procedure: Right Lumbar one-two laminectomy;  Surgeon: Elaina Hoops, MD;  Location: [place] NEURO ORS;  Service: Neurosurgery;  Laterality: Right;  . Carpel tunnel Bilateral   . Lumbar laminectomy/decompression microdiscectomy Left 10/29/2013    Procedure: Left Lumbar five-Sacral one Laminectomy;  Surgeon: Elaina Hoops, MD;  Location: [place] NEUR[facility] ORS;  Service: Neurosurgery;  Laterality: Left;  . Lumbar wound debridement N/A 01/25/2014    Procedure: LUMBAR WOUND DEBRIDEMENT;  Surgeon: Charlie Pitter, MD;  Location: [place] NEURO ORS;  Service: Neurosurgery;  Laterality: N/A;  . Lumbar wound debridement N/A 02/25/2014    Procedure: LUMBAR WOUND DEBRIDEMENT;  Surgeon: Elaina Hoops, MD;  Location: [place] NEUR[facility] ORS;   Service: Neurosurgery;  Laterality: N/A;    Current Outpatient Rx  Name  Route  Sig  Dispense  Refill  . amitriptyline (ELAVIL) 100 MG tablet   Oral   Take 100 mg by mouth at bedtime.          Marland Kitchen amitriptyline (ELAVIL) 50 MG tablet   Oral   Take 50 mg by mouth at bedtime.         Marland Kitchen amoxicillin (AMOXIL) 500 MG capsule   Oral   Take 1 capsule (500 mg total) by mouth 3 (three) times daily. Patient not taking: Reported on 12/21/2014   90 capsule   2   . carvedilol (COREG) 25 MG tablet   Oral   Take 1 tablet (25 mg total) by mouth 2 (two) times daily with a meal. Patient not taking: Reported on 12/21/2014   60 tablet   2   . CRESTOR 20 MG tablet   Oral   Take 20 mg by mouth daily.      0     Dispense as written.   . fluconazole (DIFLUCAN) 150 MG tablet   Oral   Take 1 tablet (150 mg total) by mouth daily. If still having yeast infection after 3 days, can take additional dose Patient not taking: Reported on 12/21/2014   10 tablet   1     Place on file patient will call when refill needed   . gabapentin (NEURONTIN) 300 MG capsule   Oral   Take 600 mg by mouth 3 (three) times daily.          . insulin aspart (NOVOLOG FLEXPEN) 100 UNIT/ML FlexPen   Subcutaneous   Inject 10-20 Units into the skin 3 (three) times daily with meals. Sliding scale   15 mL   11   . insulin glargine (LANTUS) 100 UNIT/ML injection   Subcutaneous   Inject 16 Units into the skin at bedtime.         . insulin glargine (LANTUS) 100 UNIT/ML injection   Subcutaneous   Inject 10 Units into the skin at bedtime.         . Liraglutide (VICTOZA) 18 MG/3ML SOPN   Subcutaneous   Inject 1.8 mg into the skin daily.         Marland Kitchen lisinopril (PRINIVIL,ZESTRIL) 10 MG tablet   Oral   Take 10 mg by mouth daily.         . metoCLOPramide (REGLAN) 10 MG tablet   Oral   Take 1 tablet (10 mg total) by mouth every 6 (six) hours as needed for nausea, vomiting or refractory nausea / vomiting. Patient  not taking: Reported on 09/22/2014   30 tablet   0   . nitroGLYCERIN (NITROSTAT) 0.4 MG SL tablet   Sublingual   Place 0.4 mg under the tongue every 5 (five) minutes as needed for chest pain.         Marland Kitchen  oxyCODONE (OXY IR/ROXICODONE) 5 MG immediate release tablet   Oral   Take 1 tablet (5 mg total) by mouth every 4 (four) hours as needed for moderate pain.   90 tablet   0   . oxyCODONE (ROXICODONE) 15 MG immediate release tablet      1/2 - 1 tablet 4-7 times per day if tolerated   200 tablet   0   . prochlorperazine (COMPAZINE) 10 MG tablet   Oral   Take 1 tablet (10 mg total) by mouth every 8 (eight) hours as needed for nausea or vomiting. Patient not taking: Reported on 09/22/2014   30 tablet   1   . rosuvastatin (CRESTOR) 40 MG tablet   Oral   Take 40 mg by mouth daily.         . simvastatin (ZOCOR) 40 MG tablet   Oral   Take 40 mg by mouth daily.         Marland Kitchen spironolactone (ALDACTONE) 25 MG tablet   Oral   Take 25 mg by mouth daily.         Marland Kitchen sulfamethoxazole-trimethoprim (BACTRIM DS,SEPTRA DS) 800-160 MG per tablet   Oral   Take 1 tablet by mouth 2 (two) times daily. Patient not taking: Reported on 12/21/2014   60 tablet   2   . SUMAtriptan (IMITREX) 50 MG tablet   Oral   Take 50 mg by mouth every 2 (two) hours as needed for migraine or headache. May repeat in 2 hours if headache persists or recurs.         Marland Kitchen tiZANidine (ZANAFLEX) 4 MG tablet      Limit 1 tab po bid - tid if tolerated   90 tablet   0   . topiramate (TOPAMAX) 100 MG tablet   Oral   Take 100 mg by mouth at bedtime. Take with 25 mg tablet for a 125 mg dose         . topiramate (TOPAMAX) 100 MG tablet   Oral   Take 100 mg by mouth. Take 1.5 tablets daily at bedtime           Allergies Bactrim; Ciprofloxacin; Rocephin; Vancomycin; Azithromycin; and Food  Family History  Problem Relation Age of Onset  . Depression Mother   . Drug abuse Mother   . Early death Mother   .  Hypertension Mother   . Varicose Veins Mother   . Diabetes Father   . Early death Father   . Hyperlipidemia Father     Social History History  Substance Use Topics  . Smoking status: Never Smoker   . Smokeless tobacco: Not on file  . Alcohol Use: No    Review of Systems Constitutional: No fever/chills Eyes: Heard vision and diplopia ENT: No sore throat. Cardiovascular: Denies chest pain. Respiratory: Denies shortness of breath. Gastrointestinal: No abdominal pain.  No nausea, no vomiting.   Genitourinary: Negative for dysuria. Musculoskeletal: Negative for back pain. Skin: Negative for rash. Neurological: Headache, left sided weakness, right-sided facial numbness  10-point ROS otherwise negative.  ____________________________________________   PHYSICAL EXAM:  VITAL SIGNS: ED Triage Vitals  Enc Vitals Group     BP 01/04/15 0202 96/68 mmHg     Pulse Rate 01/04/15 0202 92     Resp 01/04/15 0202 20     Temp --      Temp src --      SpO2 01/04/15 0202 100 %     Weight 01/04/15 0202 205 lb (92.987  kg)     Height 01/04/15 0202 5\' 5"  (1.651 m)     Head Cir --      Peak Flow --      Pain Score --      Pain Loc --      Pain Edu? --      Excl. in [place]? --     Constitutional: Alert and oriented. Patient well appearing but in some moderate distress Eyes: Conjunctivae are normal. PERRL. EOMI. Head: Atraumatic. Nose: No congestion/rhinnorhea. Mouth/Throat: Mucous membranes are moist.  Oropharynx non-erythematous. Cardiovascular: Normal rate, regular rhythm. Grossly normal heart sounds.  Good peripheral circulation. Respiratory: Normal respiratory effort.  No retractions. Lungs CTAB. Gastrointestinal: Soft and nontender. No distention.  Genitourinary: Deferred Musculoskeletal: Strength 4 out of 5 in left upper extremity and 3 out of 5 in left lower extremity Neurologic:  Halting speech, right-sided facial droop mild, right sided facial decrease in sensation, mild left  pronator drift, decreased strength in left upper and lower extremity, mild ataxia with finger to nose, alert and oriented 3 Skin:  Skin is warm, dry and intact. No rash noted. Psychiatric: Mood and affect are normal.   ____________________________________________   LABS (all labs ordered are listed, but only abnormal results are displayed)  Labs Reviewed - No data to display ____________________________________________  EKG  ED ECG REPORT I, Loney Hering, the attending physician, personally viewed and interpreted this ECG.   Date: 01/04/2015  EKG Time: 223  Rate: 88  Rhythm: normal sinus rhythm  Axis: Normal  Intervals:none  ST&T Change: None  ____________________________________________  RADIOLOGY  CT head: Negative head CT Chest x-ray: No active disease ____________________________________________   PROCEDURES  Procedure(s) performed: None  Critical Care performed: No  ____________________________________________   INITIAL IMPRESSION / ASSESSMENT AND PLAN / ED COURSE  Pertinent labs & imaging results that were available during my care of the patient were reviewed by me and considered in my medical decision making (see chart for details).  The patient is a 39 year old female who comes in tonight with some speech difficulties dizziness and weakness with a concern for possible stroke. The patient was seen immediately upon arrival to her room and evaluated. The patient seemed to have an NIH score of 7 on my evaluation so the tele neurologist was consulted to evaluate the patient for possible TPA.  The telemetry neurologist reported that the patient has had this left-sided weakness for over a year and felt that the patient did not need TPA at this time. He did recommend though that the patient be admitted for further evaluation of stroke as well as an MRI. On his evaluation the patient's symptoms had resolved significantly.  The patient will be admitted to the  hospital for further evaluation. I will give the patient a liter of normal saline as well as aspirin.   ____________________________________________   FINAL CLINICAL IMPRESSION(S) / ED DIAGNOSES  Final diagnoses:  Cerebrovascular Disease Dizziness      Loney Hering, MD 01/04/15 [phone]

## 2015-01-04 NOTE — Progress Notes (Signed)
Pt expressed desire to "go outside".  Informed Pt that telemetry would read from outside.  Pt then stated she wanted to leave.  Talked with patient about leaving AMA.  Pt adamant about leaving.  MD informed.  AMA paperwork signed.  IV removed.  Pt left floor with all personal belongings.

## 2015-01-04 NOTE — ED Notes (Signed)
Pt taken to CT with RN from triage. Charge RN called for room.

## 2015-01-04 NOTE — ED Notes (Signed)
Pt rang callbell, states that she wants her IV out and wants to go home. MD aware.

## 2015-01-04 NOTE — H&P (Addendum)
Marissa [name] is an 39 y.o. female.   Chief Complaint: Dizziness HPI: The patient presents emergency room complaining of an episode of dizziness. It began when she stood up after laying down in bed. She had gotten up because she felt her left leg cramping, but when she arose her vision became blurry and she fell backward onto the bed. She sustained no injury when she fell. Her boyfriend notes that her speech was "off". She denies any chest pain, shortness of breath, nausea or vomiting. In the emergency department the patient's head CT was normal. She admits to a frontal headache that is not unlike her migraines. Her physical exam revealed some left-sided weakness which the patient states is chronic, but due to her constellation of symptoms the emergency department staff called for admission.  Past Medical History  Diagnosis Date  . Hypertension   . Coronary artery disease   . Neuropathy   . Headache(784.0)   . Arthritis   . Vision loss     due to diabetes  . Myocardial infarction 2006    "due to medication"; no evidence of ischemia or infarction by nuclear stress test '11  . Dysrhythmia     tachycardia  . Diabetes mellitus without complication     fasting cbg 50-140s  . GERD (gastroesophageal reflux disease)     otc meds  . Migraines     once/month maybe - can last up to two weeks  . Hyperlipemia   . Cardiomyopathy, dilated, nonischemic   . MI, old     2006  . Neck pain, chronic   . Back pain, chronic   . Depression   . Anxiety   . Hypercholesteremia   . Allergy   . Anemia   . Prolonged QT interval syndrome     Past Surgical History  Procedure Laterality Date  . Back surgery  2011    Lumbar   . Cervical fusion  2006  . Salpingoophorectomy Bilateral     1 1997. 2nd 2001  . Cardiac catheterization  07/18/2004    50-60% mid LAD, minor luminal irregularities RCA, normal LM and CX, EF 50-55% [facility])  . Lumbar laminectomy/decompression microdiscectomy  Right 05/07/2013    Procedure: Right Lumbar one-two laminectomy;  Surgeon: Elaina Hoops, MD;  Location: [place] [facility];  Service: Neurosurgery;  Laterality: Right;  . Carpel tunnel Bilateral   . Lumbar laminectomy/decompression microdiscectomy Left 10/29/2013    Procedure: Left Lumbar five-Sacral one Laminectomy;  Surgeon: Elaina Hoops, MD;  Location: [place] [facility];  Service: Neurosurgery;  Laterality: Left;  . Lumbar wound debridement N/A 01/25/2014    Procedure: LUMBAR WOUND DEBRIDEMENT;  Surgeon: Charlie Pitter, MD;  Location: [place] [facility];  Service: Neurosurgery;  Laterality: N/A;  . Lumbar wound debridement N/A 02/25/2014    Procedure: LUMBAR WOUND DEBRIDEMENT;  Surgeon: Elaina Hoops, MD;  Location: [place] [facility];  Service: Neurosurgery;  Laterality: N/A;    Family History  Problem Relation Age of Onset  . Depression Mother   . Drug abuse Mother   . Early death Mother   . Hypertension Mother   . Varicose Veins Mother   . Diabetes Father   . Early death Father   . Hyperlipidemia Father    Social History:  reports that she has never smoked. She does not have any smokeless tobacco history on file. She reports that she does not drink alcohol or use illicit drugs.  Allergies:  Allergies  Allergen Reactions  .  Bactrim [Sulfamethoxazole-Trimethoprim] Nausea And Vomiting  . Ciprofloxacin Swelling  . Rocephin [Ceftriaxone Sodium In Dextrose] Nausea And Vomiting  . Vancomycin Nausea And Vomiting  . Azithromycin Itching and Rash  . Food Itching and Rash    "Greek yogurt"    Prior to Admission medications   Medication Sig Start Date End Date Taking? Authorizing Provider  amitriptyline (ELAVIL) 100 MG tablet Take 100 mg by mouth at bedtime.     Historical Provider, MD  amitriptyline (ELAVIL) 50 MG tablet Take 50 mg by mouth at bedtime.    Historical Provider, MD  amoxicillin (AMOXIL) 500 MG capsule Take 1 capsule (500 mg total) by mouth 3 (three) times daily. Patient not taking: Reported on  12/21/2014 07/31/14   Carlyle Basques, MD  carvedilol (COREG) 25 MG tablet Take 1 tablet (25 mg total) by mouth 2 (two) times daily with a meal. Patient not taking: Reported on 12/21/2014 02/04/14   Kinnie Feil, MD  CRESTOR 20 MG tablet Take 20 mg by mouth daily. 08/31/14   Historical Provider, MD  fluconazole (DIFLUCAN) 150 MG tablet Take 1 tablet (150 mg total) by mouth daily. If still having yeast infection after 3 days, can take additional dose Patient not taking: Reported on 12/21/2014 07/31/14   Carlyle Basques, MD  gabapentin (NEURONTIN) 300 MG capsule Take 600 mg by mouth 3 (three) times daily.     Historical Provider, MD  insulin aspart (NOVOLOG FLEXPEN) 100 UNIT/ML FlexPen Inject 10-20 Units into the skin 3 (three) times daily with meals. Sliding scale 02/04/14   Kinnie Feil, MD  insulin glargine (LANTUS) 100 UNIT/ML injection Inject 16 Units into the skin at bedtime.    Historical Provider, MD  insulin glargine (LANTUS) 100 UNIT/ML injection Inject 10 Units into the skin at bedtime.    Historical Provider, MD  Liraglutide (VICTOZA) 18 MG/3ML SOPN Inject 1.8 mg into the skin daily.    Historical Provider, MD  lisinopril (PRINIVIL,ZESTRIL) 10 MG tablet Take 10 mg by mouth daily.    Historical Provider, MD  metoCLOPramide (REGLAN) 10 MG tablet Take 1 tablet (10 mg total) by mouth every 6 (six) hours as needed for nausea, vomiting or refractory nausea / vomiting. Patient not taking: Reported on 09/22/2014 02/17/14   Linton Flemings, MD  nitroGLYCERIN (NITROSTAT) 0.4 MG SL tablet Place 0.4 mg under the tongue every 5 (five) minutes as needed for chest pain.    Historical Provider, MD  oxyCODONE (OXY IR/ROXICODONE) 5 MG immediate release tablet Take 1 tablet (5 mg total) by mouth every 4 (four) hours as needed for moderate pain. 03/02/14   Earnie Larsson, MD  oxyCODONE (ROXICODONE) 15 MG immediate release tablet 1/2 - 1 tablet 4-7 times per day if tolerated 12/21/14   Mohammed Kindle, MD  prochlorperazine  (COMPAZINE) 10 MG tablet Take 1 tablet (10 mg total) by mouth every 8 (eight) hours as needed for nausea or vomiting. Patient not taking: Reported on 09/22/2014 05/06/14   Carlyle Basques, MD  rosuvastatin (CRESTOR) 40 MG tablet Take 40 mg by mouth daily.    Historical Provider, MD  simvastatin (ZOCOR) 40 MG tablet Take 40 mg by mouth daily.    Historical Provider, MD  spironolactone (ALDACTONE) 25 MG tablet Take 25 mg by mouth daily.    Historical Provider, MD  sulfamethoxazole-trimethoprim (BACTRIM DS,SEPTRA DS) 800-160 MG per tablet Take 1 tablet by mouth 2 (two) times daily. Patient not taking: Reported on 12/21/2014 07/31/14   Carlyle Basques, MD  SUMAtriptan (IMITREX) 50 MG  tablet Take 50 mg by mouth every 2 (two) hours as needed for migraine or headache. May repeat in 2 hours if headache persists or recurs.    Historical Provider, MD  tiZANidine (ZANAFLEX) 4 MG tablet Limit 1 tab po bid - tid if tolerated 12/21/14   Mohammed Kindle, MD  topiramate (TOPAMAX) 100 MG tablet Take 100 mg by mouth at bedtime. Take with 25 mg tablet for a 125 mg dose    Historical Provider, MD  topiramate (TOPAMAX) 100 MG tablet Take 100 mg by mouth. Take 1.5 tablets daily at bedtime    Historical Provider, MD     Results for orders placed or performed during the hospital encounter of 01/04/15 (from the past 48 hour(s))  CBC     Status: Abnormal   Collection Time: 01/04/15  2:33 AM  Result Value Ref Range   WBC 4.7 3.6 - 11.0 K/uL   RBC 4.10 3.80 - 5.20 MIL/uL   Hemoglobin 11.6 (L) 12.0 - 16.0 g/dL   HCT 35.3 35.0 - 47.0 %   MCV 86.1 80.0 - 100.0 fL   MCH 28.3 26.0 - 34.0 pg   MCHC 32.9 32.0 - 36.0 g/dL   RDW 15.8 (H) 11.5 - 14.5 %   Platelets 150 150 - 440 K/uL  Troponin I     Status: None   Collection Time: 01/04/15  2:33 AM  Result Value Ref Range   Troponin I <0.03 <0.031 ng/mL    Comment:        NO INDICATION OF MYOCARDIAL INJURY.   Comprehensive metabolic panel     Status: Abnormal   Collection Time:  01/04/15  2:33 AM  Result Value Ref Range   Sodium 134 (L) 135 - 145 mmol/L   Potassium 4.4 3.5 - 5.1 mmol/L   Chloride 105 101 - 111 mmol/L   CO2 24 22 - 32 mmol/L   Glucose, Bld 249 (H) 65 - 99 mg/dL   BUN 20 6 - 20 mg/dL   Creatinine, Ser 1.09 (H) 0.44 - 1.00 mg/dL   Calcium 8.3 (L) 8.9 - 10.3 mg/dL   Total Protein 6.8 6.5 - 8.1 g/dL   Albumin 3.3 (L) 3.5 - 5.0 g/dL   AST 36 15 - 41 U/L   ALT 33 14 - 54 U/L   Alkaline Phosphatase 98 38 - 126 U/L   Total Bilirubin 0.2 (L) 0.3 - 1.2 mg/dL   GFR calc non Af Amer >60 >60 mL/min   GFR calc Af Amer >60 >60 mL/min    Comment: (NOTE) The eGFR has been calculated using the CKD EPI equation. This calculation has not been validated in all clinical situations. eGFR's persistently <60 mL/min signify possible Chronic Kidney Disease.    Anion gap 5 5 - 15   Ct Head Wo Contrast  01/04/2015   CLINICAL DATA:  Dizziness, off balance with slurred speech for 30 minutes.  EXAM: CT HEAD WITHOUT CONTRAST  TECHNIQUE: Contiguous axial images were obtained from the base of the skull through the vertex without intravenous contrast.  COMPARISON:  01/30/2014  FINDINGS: Skull and Sinuses:Negative for fracture or destructive process. The mastoids, middle ears, and imaged paranasal sinuses are clear.  Orbits: Dysconjugate gaze, nonspecific and also seen previously.  Brain: No evidence of acute infarction, hemorrhage, hydrocephalus, or mass lesion/mass effect.  IMPRESSION: Negative head CT.   Electronically Signed   By: Monte Fantasia M.D.   On: 01/04/2015 02:47   Dg Chest Portable 1 View  01/04/2015  CLINICAL DATA:  Dizziness.  EXAM: PORTABLE CHEST - 1 VIEW  COMPARISON:  03/15/2014  FINDINGS: Normal heart size and mediastinal contours. No acute infiltrate or edema. No effusion or pneumothorax. No acute osseous findings.  IMPRESSION: No active disease.   Electronically Signed   By: Monte Fantasia M.D.   On: 01/04/2015 03:10    Review of Systems   Constitutional: Negative for fever and chills.  HENT: Negative for sore throat and tinnitus.   Eyes: Negative for blurred vision and redness.  Respiratory: Negative for cough and shortness of breath.   Cardiovascular: Negative for chest pain, palpitations, orthopnea and PND.  Gastrointestinal: Negative for nausea, vomiting, abdominal pain and diarrhea.  Genitourinary: Negative for dysuria, urgency and frequency.  Musculoskeletal: Negative for myalgias and joint pain.  Skin: Negative for rash.       No lesions  Neurological: Positive for dizziness. Negative for speech change, focal weakness and weakness.       Headache  Endo/Heme/Allergies: Does not bruise/bleed easily.       No temperature intolerance  Psychiatric/Behavioral: Negative for depression and suicidal ideas.    Blood pressure 90/75, pulse 89, resp. rate 20, height 5' 5" (1.651 m), weight 92.987 kg (205 lb), last menstrual period 12/14/2014, SpO2 99 %. Physical Exam  Vitals reviewed. Constitutional: She is oriented to person, place, and time. She appears well-developed and well-nourished.  HENT:  Head: Normocephalic and atraumatic.  Eyes: EOM are normal. Pupils are equal, round, and reactive to light.  Neck: Normal range of motion. No tracheal deviation present. No thyromegaly present.  Cardiovascular: Normal rate, regular rhythm and normal heart sounds.  Exam reveals no gallop and no friction rub.   No murmur heard. Respiratory: Effort normal and breath sounds normal.  GI: Soft. Bowel sounds are normal. She exhibits no distension. There is no tenderness.  Lymphadenopathy:    She has no cervical adenopathy.  Neurological: She is alert and oriented to person, place, and time. No cranial nerve deficit. She exhibits normal muscle tone.  Mild pronator drift on L 4+/5 strnegth upper and lower extremity on L 2 beats nystagmus  Skin: Skin is warm and dry.  Psychiatric: She has a normal mood and affect. Judgment and thought  content normal.     Assessment/Plan This is a 39 year old female with multiple medical problems who is been admitted for dizziness.  1. Dizziness: Likely vertigo although this could be part of a complicated migraine as patient states she currently has a headache in the same position as her usual migraines, however it is currently less severe. Slight weakness of the upper and lower extremities and pronator drift is present on the left which she states is an old finding. She has horizontal nystagmus on physical exam. Her slurred speech has resolved. Acute neurology consult did not recommend TPA. I agree that she unlikely is having a stroke. She may receive meclizine as needed. We will observe her overnight and have a formal neurology consultation prior to discharge. 2. Headache: Likely a complicated migraine. Continue scheduled topiramate and triptan therapy as needed. 3. Prolonged QT: The patient is aware of this. She does not have an AICD. Her cardiologist has been following this finding. I place her on telemetry for safety. She has had no arrhythmias or chest pain during this hospitalization. Recommend decreasing dose of amitriptyline. 4. Coronary artery disease: Stable continue secondary prevention 5. Hypertension: Continue carvedilol, lisinopril and spironolactone (the latter is presumably for cardiomyopathy; no evidence of heart failure at this  time) 6. Diabetes mellitus type 2: Continue basal insulin. Sliding scale insulin per home regimen as well as Victoza. 7. Chronic pain: The patient is on large amounts of narcotics. She sees pain clinic. Recommend decreasing short acting narcotics and replacing with long-acting analgesics. 8. Obesity: BMI is 34.2; encourage healthy diet and exercise. 9. DVT prophylaxis: Heparin 10. GI prophylaxis: None The patient is a full code. Time spent on admission orders and patient care approximately 35 minutes Harrie Foreman 01/04/2015, 6:25 AM

## 2015-01-04 NOTE — Progress Notes (Signed)
Patient requests to leave AMA, discussed risks and benefits, which she accepts, regardless she signed AMA papers and will be leaving shortly

## 2015-01-05 LAB — HEMOGLOBIN A1C: Hgb A1c MFr Bld: 8.4 % — ABNORMAL HIGH (ref 4.0–6.0)

## 2015-01-17 ENCOUNTER — Other Ambulatory Visit: Payer: Self-pay | Admitting: Pain Medicine

## 2015-01-17 DIAGNOSIS — M5126 Other intervertebral disc displacement, lumbar region: Secondary | ICD-10-CM

## 2015-01-17 DIAGNOSIS — E1121 Type 2 diabetes mellitus with diabetic nephropathy: Secondary | ICD-10-CM

## 2015-01-17 DIAGNOSIS — E1340 Other specified diabetes mellitus with diabetic neuropathy, unspecified: Secondary | ICD-10-CM

## 2015-01-17 DIAGNOSIS — M5030 Other cervical disc degeneration, unspecified cervical region: Secondary | ICD-10-CM

## 2015-01-17 DIAGNOSIS — M5137 Other intervertebral disc degeneration, lumbosacral region: Secondary | ICD-10-CM

## 2015-01-17 DIAGNOSIS — T847XXD Infection and inflammatory reaction due to other internal orthopedic prosthetic devices, implants and grafts, subsequent encounter: Secondary | ICD-10-CM

## 2015-01-17 DIAGNOSIS — M5134 Other intervertebral disc degeneration, thoracic region: Secondary | ICD-10-CM

## 2015-01-18 NOTE — Discharge Summary (Signed)
Unfortunately, this patient left AGAINST MEDICAL ADVICE in the middle of the night. We did not have the opportunity to adjust medications, write prescriptions, make appointments for further evaluation, give any recommendations for discharge instructions.  [facility]   PATIENT NAME: Marissa [name]    MR#:  [id number]  DATE OF BIRTH:  1976-05-15  DATE OF ADMISSION:  01/04/2015 ADMITTING PHYSICIAN: Harrie Foreman, MD  DATE OF DISCHARGE: 01/04/2015 10:25 PM  PRIMARY CARE PHYSICIAN: [name], MD    ADMISSION DIAGNOSIS:  Dizziness [R42] Weakness [R53.1]  DISCHARGE DIAGNOSIS:  Principal Problem:   Dizziness Active Problems:   DM (diabetes mellitus), type 2 with renal complications   Headache   SECONDARY DIAGNOSIS:   Past Medical History  Diagnosis Date  . Hypertension   . Coronary artery disease   . Neuropathy   . Headache(784.0)   . Arthritis   . Vision loss     due to diabetes  . Myocardial infarction 2006    "due to medication"; no evidence of ischemia or infarction by nuclear stress test '11  . Dysrhythmia     tachycardia  . Diabetes mellitus without complication     fasting cbg 50-140s  . GERD (gastroesophageal reflux disease)     otc meds  . Migraines     once/month maybe - can last up to two weeks  . Hyperlipemia   . Cardiomyopathy, dilated, nonischemic   . MI, old     2006  . Neck pain, chronic   . Back pain, chronic   . Depression   . Anxiety   . Hypercholesteremia   . Allergy   . Anemia   . Prolonged QT interval syndrome     HOSPITAL COURSE:   See progress note 5/23  DISCHARGE CONDITIONS:   Left AMA  CONSULTS OBTAINED:  Treatment Team:  Leotis Pain, MD  DRUG ALLERGIES:   Allergies  Allergen Reactions  . Bactrim [Sulfamethoxazole-Trimethoprim] Nausea And Vomiting  . Ciprofloxacin Swelling  . Rocephin [Ceftriaxone Sodium In Dextrose] Nausea And  Vomiting  . Vancomycin Nausea And Vomiting  . Azithromycin Itching and Rash  . Food Itching and Rash    "Greek yogurt"    DISCHARGE MEDICATIONS:   Discharge Medication List as of 01/04/2015 10:26 PM    CONTINUE these medications which have NOT CHANGED   Details  amitriptyline (ELAVIL) 100 MG tablet Take 100 mg by mouth at bedtime. , Until Discontinued, Historical Med    carvedilol (COREG) 25 MG tablet Take 1 tablet (25 mg total) by mouth 2 (two) times daily with a meal., Starting 02/04/2014, Until Discontinued, Normal    gabapentin (NEURONTIN) 300 MG capsule Take 600 mg by mouth 3 (three) times daily. , Until Discontinued, Historical Med    insulin aspart (NOVOLOG FLEXPEN) 100 UNIT/ML FlexPen Inject 10-20 Units into the skin 3 (three) times daily with meals. Sliding scale, Starting 02/04/2014, Until Discontinued, Normal    insulin glargine (LANTUS) 100 UNIT/ML injection Inject 16 Units into the skin at bedtime., Until Discontinued, Historical Med    Liraglutide (VICTOZA) 18 MG/3ML SOPN Inject 1.8 mg into the skin daily., Until Discontinued, Historical Med    lisinopril (PRINIVIL,ZESTRIL) 10 MG tablet Take 10 mg by mouth daily., Until Discontinued, Historical Med    nitroGLYCERIN (NITROSTAT) 0.4 MG SL tablet Place 0.4 mg under the tongue every 5 (five) minutes as needed for chest pain., Until Discontinued, Historical Med    oxyCODONE (ROXICODONE) 15 MG  immediate release tablet 1/2 - 1 tablet 4-7 times per day if tolerated, Print    rosuvastatin (CRESTOR) 40 MG tablet Take 40 mg by mouth daily., Until Discontinued, Historical Med    spironolactone (ALDACTONE) 25 MG tablet Take 25 mg by mouth daily., Until Discontinued, Historical Med    tiZANidine (ZANAFLEX) 4 MG tablet Limit 1 tab po bid - tid if tolerated, Normal    topiramate (TOPAMAX) 100 MG tablet Take 100 mg by mouth at bedtime. Take with 25 mg tablet for a 125 mg dose, Until Discontinued, Historical Med      STOP taking  these medications     SUMAtriptan (IMITREX) 50 MG tablet          DISCHARGE INSTRUCTIONS:   None given as the patient left AMA  Today   CHIEF COMPLAINT:   Chief Complaint  Patient presents with  . Dizziness    Pt presents to ER with husband. Husband states pt has been "off balance" and slurred speech for 30 minutes. Pt has slurred speech noted, pt states she took her regular meds (muscle relaxer) tonight, states normal dose.    HISTORY OF PRESENT ILLNESS:  HPI: The patient presents emergency room complaining of an episode of dizziness. It began when she stood up after laying down in bed. She had gotten up because she felt her left leg cramping, but when she arose her vision became blurry and she fell backward onto the bed. She sustained no injury when she fell. Her boyfriend notes that her speech was "off". She denies any chest pain, shortness of breath, nausea or vomiting. In the emergency department the patient's head CT was normal. She admits to a frontal headache that is not unlike her migraines. Her physical exam revealed some left-sided weakness which the patient states is chronic, but due to her constellation of symptoms the emergency department staff called for admission.   VITAL SIGNS:  Blood pressure 95/55, pulse 90, temperature 97.5 F (36.4 C), temperature source Axillary, resp. rate 18, height 5' 4.5" (1.638 m), weight 98.204 kg (216 lb 8 oz), last menstrual period 12/21/2014, SpO2 99 %.  I/O:  No intake or output data in the 24 hours ending 01/18/15 1549  PHYSICAL EXAMINATION:   See progress note 5/23  DATA REVIEW:   CBC No results for input(s): WBC, HGB, HCT, PLT in the last 168 hours.  Chemistries  No results for input(s): NA, K, CL, CO2, GLUCOSE, BUN, CREATININE, CALCIUM, MG, AST, ALT, ALKPHOS, BILITOT in the last 168 hours.  Invalid input(s): GFRCGP  Cardiac Enzymes No results for input(s): TROPONINI in the last 168 hours.  Microbiology Results   Results for orders placed or performed in visit on 03/13/14  Culture, blood (single)     Status: None   Collection Time: 03/13/14  6:48 PM  Result Value Ref Range Status   Micro Text Report   Final       COMMENT                   NO GROWTH AEROBICALLY/ANAEROBICALLY IN 5 DAYS   ANTIBIOTIC                                                        RADIOLOGY:  No results found.  EKG:   Orders placed or performed during  the hospital encounter of 01/04/15  . ED EKG  . ED EKG  . EKG 12-Lead  . EKG 12-Lead  . EKG      Management plans discussed with the patient, family and they are in agreement.  CODE STATUS: Full  TOTAL TIME TAKING CARE OF THIS PATIENT: 0 minutes.    Myrtis Ser M.D on 01/18/2015 at 3:49 PM  Between 7am to 6pm - Pager - [phone]  After 6pm go to [email or web address] - password [email or web address] [facility]  Office  [phone]  CC: Primary care physician; [name], MD

## 2015-01-21 ENCOUNTER — Encounter: Payer: PPO | Admitting: Pain Medicine

## 2015-01-21 ENCOUNTER — Encounter: Payer: Self-pay | Admitting: Pain Medicine

## 2015-01-21 ENCOUNTER — Ambulatory Visit: Payer: PPO | Attending: Pain Medicine | Admitting: Pain Medicine

## 2015-01-21 VITALS — BP 135/86 | HR 96 | Temp 98.4°F | Resp 16 | Ht 64.0 in | Wt 212.0 lb

## 2015-01-21 DIAGNOSIS — M4806 Spinal stenosis, lumbar region: Secondary | ICD-10-CM | POA: Diagnosis not present

## 2015-01-21 DIAGNOSIS — M542 Cervicalgia: Secondary | ICD-10-CM | POA: Diagnosis present

## 2015-01-21 DIAGNOSIS — M5136 Other intervertebral disc degeneration, lumbar region: Secondary | ICD-10-CM | POA: Diagnosis not present

## 2015-01-21 DIAGNOSIS — M79602 Pain in left arm: Secondary | ICD-10-CM | POA: Diagnosis present

## 2015-01-21 DIAGNOSIS — E1340 Other specified diabetes mellitus with diabetic neuropathy, unspecified: Secondary | ICD-10-CM

## 2015-01-21 DIAGNOSIS — M5481 Occipital neuralgia: Secondary | ICD-10-CM | POA: Diagnosis not present

## 2015-01-21 DIAGNOSIS — T847XXD Infection and inflammatory reaction due to other internal orthopedic prosthetic devices, implants and grafts, subsequent encounter: Secondary | ICD-10-CM

## 2015-01-21 DIAGNOSIS — M5137 Other intervertebral disc degeneration, lumbosacral region: Secondary | ICD-10-CM

## 2015-01-21 DIAGNOSIS — M5134 Other intervertebral disc degeneration, thoracic region: Secondary | ICD-10-CM

## 2015-01-21 DIAGNOSIS — G43909 Migraine, unspecified, not intractable, without status migrainosus: Secondary | ICD-10-CM | POA: Insufficient documentation

## 2015-01-21 DIAGNOSIS — M533 Sacrococcygeal disorders, not elsewhere classified: Secondary | ICD-10-CM | POA: Insufficient documentation

## 2015-01-21 DIAGNOSIS — M79601 Pain in right arm: Secondary | ICD-10-CM | POA: Diagnosis present

## 2015-01-21 DIAGNOSIS — Z9889 Other specified postprocedural states: Secondary | ICD-10-CM | POA: Diagnosis not present

## 2015-01-21 DIAGNOSIS — M5126 Other intervertebral disc displacement, lumbar region: Secondary | ICD-10-CM

## 2015-01-21 DIAGNOSIS — G43109 Migraine with aura, not intractable, without status migrainosus: Secondary | ICD-10-CM

## 2015-01-21 DIAGNOSIS — M5030 Other cervical disc degeneration, unspecified cervical region: Secondary | ICD-10-CM | POA: Insufficient documentation

## 2015-01-21 MED ORDER — OXYCODONE HCL 15 MG PO TABS: ORAL_TABLET | ORAL | Status: DC

## 2015-01-21 MED ORDER — TIZANIDINE HCL 4 MG PO TABS: ORAL_TABLET | ORAL | Status: DC

## 2015-01-21 NOTE — Progress Notes (Signed)
Discharged to home ambulatory with script in hand for oxycodone.

## 2015-01-21 NOTE — Progress Notes (Signed)
   Subjective:    Patient ID: [name], female    DOB: 08-07-1976, 39 y.o.   MRN: [id number]  HPI Patient is 39 year old female who [facility] and treatment of pain involving the region of the neck upper extremity regions associated with headaches as well as lower back lower extremity pain. Patient is status post surgical intervention of the lumbar region and cervical regions. Patient's previous lumbar surgery was complicated by infection. Patient is with pain fairly well-controlled with non-interventional treatment measures at this time. We discussed patient's overall condition patient ha had been attending cardiac rehabilitation. Present time we will continue to avoid interventional treatment and continue with noninterventional treatment measures. Patient stated that she noted some improvement of her condition and that the pain was fairly well-controlled at this time. Patient continues to be with headaches and pain of the cervical region as well with pain of the cervical region and headaches fairly well-controlled as well        Review of Systems     Objective:   Physical Exam  There was tenderness of the spleen scapula and occipitalis regions palpation which reproduced pain of mild degree to moderate degree with well-healed surgical scar the cervical region without increased warmth or erythema in the regions of the cervical scar her neck. There appeared to be unremarkable Spurling's maneuver. Tinel and Phalen's maneuver were without increase of pain of significant degree and patient was with decreased grip strength noted. Palpation over the thoracic facet thoracic paraspinal musculature region associated take to palpation of moderate degree with moderate muscle spasms noted in the upper mid and lower thoracic paraspinal musculature regions. Palpation over the region of the lumbar paraspinal muscular region lumbar facet region was associated  with increased pain of moderate moderately severe degree lateral bending and rotation reproduce severe pain. There was tenderness of the PSIS and PII S region of moderate moderately severe degree as well.    Assessment & Plan:   Degenerative disc disease lumbar spine L5-S1 hardware in good position without complicating features extensive postsurgical changes in the posterior soft tissues without obvious abscess the study is limited without IV contrast. Moderate right spurring changes L1-2 contributing to right lateral recess and right foraminal stenosis. Bilateral lateral recess stenosis at L2-3. Far lateral spurring changes on the right at L3-4 with possible impingement on the extraforaminal L3 nerve root. Spinal and bilateral and bilateral lateral recess stenosis L4-L5 shallow extraforaminal disc protrusion on the right which could affect the right L4 nerve root  Degenerative disc disease cervical spine Status post surgery cervical region  Migraine headaches  Bilateral greater occipital neuralgia  Sacroiliac joint dysfunction    Plan   Continue present medications Zanaflex and oxycodone.  F/U PCP for evaliation of  BP and general medical  Condition  F/U surgical evaluation. Dr. [name]  F/U cardiac evaluation  F/U neurological evaluation.  May consider radiofrequency rhizolysis or intraspinal procedures pending response to present treatment and F/U evaluation.  Patient to call [facility] should patient have concerns prior to scheduled return appointment.

## 2015-01-21 NOTE — Patient Instructions (Addendum)
Continue present medications.  F/U PCP for evaliation of  BP and general medical  condition.  F/U surgical evaluation. Follow-up with Dr. [name] as discussed   F/U neurological evaluation.  May consider radiofrequency rhizolysis or intraspinal procedures pending response to present treatment and F/U evaluation.  Patient to call [facility] should patient have concerns prior to scheduled return appointment.

## 2015-02-17 ENCOUNTER — Ambulatory Visit: Payer: PPO | Attending: Pain Medicine | Admitting: Pain Medicine

## 2015-02-17 VITALS — BP 158/103 | HR 100 | Temp 98.0°F | Resp 15 | Ht 65.0 in | Wt 209.0 lb

## 2015-02-17 DIAGNOSIS — M4806 Spinal stenosis, lumbar region: Secondary | ICD-10-CM | POA: Diagnosis not present

## 2015-02-17 DIAGNOSIS — E1140 Type 2 diabetes mellitus with diabetic neuropathy, unspecified: Secondary | ICD-10-CM | POA: Diagnosis not present

## 2015-02-17 DIAGNOSIS — M5030 Other cervical disc degeneration, unspecified cervical region: Secondary | ICD-10-CM

## 2015-02-17 DIAGNOSIS — Z9889 Other specified postprocedural states: Secondary | ICD-10-CM | POA: Diagnosis not present

## 2015-02-17 DIAGNOSIS — M5126 Other intervertebral disc displacement, lumbar region: Secondary | ICD-10-CM | POA: Diagnosis not present

## 2015-02-17 DIAGNOSIS — R51 Headache: Secondary | ICD-10-CM | POA: Diagnosis not present

## 2015-02-17 DIAGNOSIS — M546 Pain in thoracic spine: Secondary | ICD-10-CM | POA: Diagnosis present

## 2015-02-17 DIAGNOSIS — M5481 Occipital neuralgia: Secondary | ICD-10-CM | POA: Insufficient documentation

## 2015-02-17 DIAGNOSIS — G43909 Migraine, unspecified, not intractable, without status migrainosus: Secondary | ICD-10-CM | POA: Insufficient documentation

## 2015-02-17 DIAGNOSIS — M5136 Other intervertebral disc degeneration, lumbar region: Secondary | ICD-10-CM | POA: Diagnosis not present

## 2015-02-17 DIAGNOSIS — M5416 Radiculopathy, lumbar region: Secondary | ICD-10-CM | POA: Diagnosis not present

## 2015-02-17 DIAGNOSIS — M5134 Other intervertebral disc degeneration, thoracic region: Secondary | ICD-10-CM

## 2015-02-17 DIAGNOSIS — M5137 Other intervertebral disc degeneration, lumbosacral region: Secondary | ICD-10-CM

## 2015-02-17 DIAGNOSIS — M542 Cervicalgia: Secondary | ICD-10-CM | POA: Diagnosis present

## 2015-02-17 MED ORDER — TIZANIDINE HCL 4 MG PO TABS: ORAL_TABLET | ORAL | Status: DC

## 2015-02-17 MED ORDER — OXYCODONE HCL 15 MG PO TABS: ORAL_TABLET | ORAL | Status: DC

## 2015-02-17 NOTE — Patient Instructions (Addendum)
Continue present medications Zanaflex and oxycodone  F/U PCP and cardiologist for evaliation of  BP and general medical condition this week. Your blood pressure is significantly elevated and you need evaluation immediately  F/U surgical evaluation  F/U neurological evaluation  May consider radiofrequency rhizolysis or intraspinal procedures pending response to present treatment and F/U evaluation.  Patient to call [facility] should patient have concerns prior to scheduled return appointment.

## 2015-02-17 NOTE — Progress Notes (Signed)
Discharged to home ambulatory with script in hand for Oxycodone.  Instructed to follow up with PCP and cardiologist for BP.  Secretary to schedule appt.

## 2015-02-17 NOTE — Progress Notes (Signed)
Safety precautions to be maintained throughout the outpatient stay will include: orient to surroundings, keep bed in low position, maintain call bell within reach at all times, provide assistance with transfer out of bed and ambulation.  

## 2015-02-17 NOTE — Progress Notes (Signed)
Subjective:    Patient ID: Marissa [name], female    DOB: 08/23/1975, 39 y.o.   MRN: [id number]  HPI  Patient is 39 year old female returns to [facility] for further evaluation and treatment of pain involving the neck headaches entire back upper and lower extremity regions especially the left lower extremity region. Patient states that the present time pain appears to be fairly well-controlled. Patient is status post surgical intervention of the lumbar region and cervical regions and palpation of additional surgical intervention at this time. We will continue present medications consisting of Zanaflex and oxycodone and remain available to consider additional modification of treatment as discussed. Patient will continue exercising with caution to avoid aggravation of symptomatology and is to follow-up with Dr. [name] [facility] and to call pain management Center for any changes in condition as discussed. Patient blood pressure was noted on today's visit we will have patient follow-up Dr. that the pontine her cardiologist today for further assessment of patient's elevated blood pressure and general medical condition. The patient was understanding and agreement status treatment plan.     Review of Systems     Objective:   Physical Exam there is tenderness of the trapezius muscles which are region of moderate degree on the left especially palpation over the splenius capitis and occipitalis musculature regions reproduced pain of mild degree to moderate degree. There was tenderness over the region of the cervical facet cervical paraspinal musculature region and thoracic facet and thoracic paraspinal musculature region of moderate degree. There was limited range of motion of the cervical spine. There appeared to be unremarkable Spurling's maneuver. Patient was with decreased grip strength. Tinel and Phalen's maneuver without increase of pain of significant degree. Palpation over the  thoracic facet thoracic paraspinal muscular region was a tends to palpation with no crepitus of the thoracic region noted. Palpation over the lumbar paraspinal musculature region lumbar facet region was a tends to palpation of moderate to moderately severe degree left greater than right. There was tennis over the PSIS and PII S region of moderate degree. Straight leg raising was decreased tolerates approximately 20. There was question decreased sensation L5 dermatomal distribution. There was negative clonus negative Homans. Palpation of the greater trochanteric region iliotibial band region was with moderate discomfort. There was negative clonus negative Homans. Abdomen nontender no costovertebral angle tenderness was noted.        Assessment & Plan:  Degenerative disc disease lumbar spine L5-S1 hardware in good position without complicating features. Extensive postsurgical changes in the posterior soft tissues without obvious abscess. Moderate spurring changes L1 to contributing to right lateral recess and right foraminal stenosis. Bilateral lateral recess stenosis L2-3. Lateral spurring L3-4 with possible impingement of the extraforaminal L3 nerve root. Spinal stenosis and lateral recess stenosis at L4-5. Shallow extraforaminal disc protrusion on the right which could affect the right L4 nerve root.  Lumbar facet syndrome  Lumbar radiculopathy  Sacroiliac joint dysfunction  Cervical degenerative disc disease Status post surgical intervention cervical region  Cervical facet syndrome  Migraine headaches  Cervicogenic headaches  Bilateral occipital neuralgia  Diabetic neuropathy   Plan   Continue present medications Zanaflex and oxycodone  F/U PCP Dr Richarda Overlie and cardiologist today for evaliation of elevated BP and general medical  condition.  F/U surgical evaluation with Dr.Cram as discussed  F/U neurological evaluation  Patient is to exercise with caution to avoid  aggravation of symptomatology pending follow-up evaluation of blood pressure and general medical condition  with Dr.Lithavong and her cardiologist  May consider radiofrequency rhizolysis or intraspinal procedures pending response to present treatment and F/U evaluation.  Patient to call [facility] Management Center should patient have concerns prior to scheduled return appointment.

## 2015-03-18 ENCOUNTER — Encounter: Payer: Self-pay | Admitting: Pain Medicine

## 2015-03-18 ENCOUNTER — Ambulatory Visit: Payer: PPO | Attending: Pain Medicine | Admitting: Pain Medicine

## 2015-03-18 VITALS — BP 141/92 | HR 89 | Temp 96.8°F | Resp 18 | Ht 65.0 in | Wt 205.0 lb

## 2015-03-18 DIAGNOSIS — M533 Sacrococcygeal disorders, not elsewhere classified: Secondary | ICD-10-CM | POA: Diagnosis not present

## 2015-03-18 DIAGNOSIS — M5416 Radiculopathy, lumbar region: Secondary | ICD-10-CM | POA: Insufficient documentation

## 2015-03-18 DIAGNOSIS — M5136 Other intervertebral disc degeneration, lumbar region: Secondary | ICD-10-CM | POA: Diagnosis not present

## 2015-03-18 DIAGNOSIS — Z9889 Other specified postprocedural states: Secondary | ICD-10-CM | POA: Insufficient documentation

## 2015-03-18 DIAGNOSIS — M5126 Other intervertebral disc displacement, lumbar region: Secondary | ICD-10-CM

## 2015-03-18 DIAGNOSIS — G43909 Migraine, unspecified, not intractable, without status migrainosus: Secondary | ICD-10-CM | POA: Insufficient documentation

## 2015-03-18 DIAGNOSIS — M5030 Other cervical disc degeneration, unspecified cervical region: Secondary | ICD-10-CM | POA: Diagnosis not present

## 2015-03-18 DIAGNOSIS — E1140 Type 2 diabetes mellitus with diabetic neuropathy, unspecified: Secondary | ICD-10-CM | POA: Insufficient documentation

## 2015-03-18 DIAGNOSIS — M5134 Other intervertebral disc degeneration, thoracic region: Secondary | ICD-10-CM

## 2015-03-18 DIAGNOSIS — M4806 Spinal stenosis, lumbar region: Secondary | ICD-10-CM | POA: Diagnosis not present

## 2015-03-18 DIAGNOSIS — E1340 Other specified diabetes mellitus with diabetic neuropathy, unspecified: Secondary | ICD-10-CM

## 2015-03-18 DIAGNOSIS — M546 Pain in thoracic spine: Secondary | ICD-10-CM | POA: Diagnosis present

## 2015-03-18 DIAGNOSIS — R51 Headache: Secondary | ICD-10-CM | POA: Diagnosis not present

## 2015-03-18 DIAGNOSIS — M542 Cervicalgia: Secondary | ICD-10-CM | POA: Diagnosis present

## 2015-03-18 DIAGNOSIS — M5137 Other intervertebral disc degeneration, lumbosacral region: Secondary | ICD-10-CM

## 2015-03-18 DIAGNOSIS — M5481 Occipital neuralgia: Secondary | ICD-10-CM | POA: Diagnosis not present

## 2015-03-18 DIAGNOSIS — M51379 Other intervertebral disc degeneration, lumbosacral region without mention of lumbar back pain or lower extremity pain: Secondary | ICD-10-CM

## 2015-03-18 MED ORDER — OXYCODONE HCL 15 MG PO TABS: ORAL_TABLET | ORAL | Status: DC

## 2015-03-18 NOTE — Progress Notes (Signed)
Safety precautions to be maintained throughout the outpatient stay will include: orient to surroundings, keep bed in low position, maintain call bell within reach at all times, provide assistance with transfer out of bed and ambulation.  

## 2015-03-18 NOTE — Patient Instructions (Addendum)
Continue present medication Zanaflex and oxycodone   May consider cluneal and sciatic nerve block as well as sacroiliac joint injection with Dr. [name] will clear you for the injection  F/U PCP Dr.Lithavong  for evaliation of  BP and general medical  Condition  F/U cardiac evaluation  F/U surgical evaluation Dr. [name]   F/U neurological evaluation  May consider radiofrequency rhizolysis or intraspinal procedures pending response to present treatment and F/U evaluation   Patient to call [facility] should patient have concerns prior to scheduled return appointmen.

## 2015-03-18 NOTE — Progress Notes (Signed)
   Subjective:    Patient ID: Marissa [name], female    DOB: 1976-05-11, 39 y.o.   MRN: [id number]  HPI  Patient is 39 year old female [facility] evaluation and treatment of pain involving the neck and entire back upper and lower extremity regions. Patient states she is in need of interventional treatment for lower back and lower extremity pain. Informed patient that she must obtain clearance from Dr.Cram prior to proceeding with interventional treatment. We will avoid the spinal region. Patient is with anticoagulant therapy and we prefer to avoid interruption of anticoagulant therapy for interventional treatment. We will consider more peripheral injection such as cluneal nerve blocks and sciatic nerve block for lower back lower extremity pain should Dr.Cram and Dr.Lithavong consent. At this time we will continue patient's Zanaflex and oxycodone as prescribed. The patient was in agreement with suggested treatment plan    Review of Systems     Objective:   Physical Exam  There was tenderness over the splenius capitis occipitalis musculature region of moderate degree. Palpation over the cervical facet cervical paraspinal musculature region was a tends to palpation of moderate degree. There appeared to be unremarkable Spurling's maneuver. Patient was evidence of decreased grip strength and Tinel and Phalen's maneuver were without increase of significant pain. There was tenderness over the thoracic facet thoracic paraspinal musculature region with evidence of moderate muscle spasms occurring in the thoracic paraspinal musculature region. Palpation over the lumbar paraspinal musculature region lumbar facet region associated with moderate moderately severe discomfort with extension and palpation of the lumbar facets reproducing moderately severe discomfort. There was moderate tends to palpation over the gluteal and piriformis musculature regions as well was tenderness  over the PSIS and PII S region. There was mild tenderness of the greater trochanteric region iliotibial band region. No definite sensory deficit of dermatomal distribution was detected. There was negative clonus negative Homans. Abdomen was nontender with no costovertebral angle tenderness noted.       Assessment & Plan:   Degenerative disc disease lumbar spine L5-S1 hardware in good position without complicating features. Extensive postsurgical changes in the posterior soft tissues without obvious abscess. Moderate spurring changes L1 to contributing to right lateral recess and right foraminal stenosis. Bilateral lateral recess stenosis L2-3. Lateral spurring L3-4 with possible impingement of the extraforaminal L3 nerve root. Spinal stenosis and lateral recess stenosis at L4-5. Shallow extraforaminal disc protrusion on the right which could affect the right L4 nerve root.  Lumbar facet syndrome  Lumbar radiculopathy  Sacroiliac joint dysfunction  Cervical degenerative disc disease Status post surgical intervention cervical regionCervical facet syndrome  Migraine headaches  Cervicogenic headaches  Bilateral occipital neuralgia  Diabetic neuropathy   Plan   Continue present medications Zanaflex and oxycodone  F/U PCP Dr Richarda Overlie and cardiologist today for evaliation of elevated BP and general medical  condition.  F/U surgical evaluation with Dr.Cram as discussed  F/U neurological evaluation  Patient is to exercise with caution to avoid aggravation of symptomatology pending follow-up evaluation of blood pressure and general medical condition with Dr.Lithavong and her cardiologist  May consider radiofrequency rhizolysis or intraspinal procedures pending response to present treatment and F/U evaluation.  Patient to call [facility] Management Center should patient have concerns prior to scheduled return appointment.

## 2015-04-04 ENCOUNTER — Other Ambulatory Visit: Payer: Self-pay

## 2015-04-04 ENCOUNTER — Emergency Department: Payer: PPO

## 2015-04-04 ENCOUNTER — Encounter: Payer: Self-pay | Admitting: Emergency Medicine

## 2015-04-04 ENCOUNTER — Emergency Department
Admission: EM | Admit: 2015-04-04 | Discharge: 2015-04-04 | Disposition: A | Discharge status: Home / Self Care | Payer: PPO | Attending: Emergency Medicine | Admitting: Emergency Medicine

## 2015-04-04 DIAGNOSIS — E1139 Type 2 diabetes mellitus with other diabetic ophthalmic complication: Secondary | ICD-10-CM | POA: Insufficient documentation

## 2015-04-04 DIAGNOSIS — E1140 Type 2 diabetes mellitus with diabetic neuropathy, unspecified: Secondary | ICD-10-CM | POA: Insufficient documentation

## 2015-04-04 DIAGNOSIS — K8020 Calculus of gallbladder without cholecystitis without obstruction: Secondary | ICD-10-CM | POA: Diagnosis not present

## 2015-04-04 DIAGNOSIS — I10 Essential (primary) hypertension: Secondary | ICD-10-CM | POA: Insufficient documentation

## 2015-04-04 DIAGNOSIS — H540 Blindness, both eyes: Secondary | ICD-10-CM | POA: Insufficient documentation

## 2015-04-04 DIAGNOSIS — E1129 Type 2 diabetes mellitus with other diabetic kidney complication: Secondary | ICD-10-CM | POA: Diagnosis not present

## 2015-04-04 DIAGNOSIS — Z794 Long term (current) use of insulin: Secondary | ICD-10-CM | POA: Diagnosis not present

## 2015-04-04 DIAGNOSIS — Z79899 Other long term (current) drug therapy: Secondary | ICD-10-CM | POA: Insufficient documentation

## 2015-04-04 DIAGNOSIS — R079 Chest pain, unspecified: Secondary | ICD-10-CM | POA: Diagnosis present

## 2015-04-04 DIAGNOSIS — K8050 Calculus of bile duct without cholangitis or cholecystitis without obstruction: Secondary | ICD-10-CM

## 2015-04-04 LAB — COMPREHENSIVE METABOLIC PANEL
ALBUMIN: 3.5 g/dL (ref 3.5–5.0)
ALT: 23 U/L (ref 14–54)
ANION GAP: 7 (ref 5–15)
AST: 27 U/L (ref 15–41)
Alkaline Phosphatase: 123 U/L (ref 38–126)
BUN: 15 mg/dL (ref 6–20)
CHLORIDE: 109 mmol/L (ref 101–111)
CO2: 23 mmol/L (ref 22–32)
Calcium: 9.2 mg/dL (ref 8.9–10.3)
Creatinine, Ser: 0.94 mg/dL (ref 0.44–1.00)
GFR calc Af Amer: >60 mL/min (ref >60)
GFR calc non Af Amer: >60 mL/min (ref >60)
GLUCOSE: 385 mg/dL — AB (ref 65–99)
POTASSIUM: 4.2 mmol/L (ref 3.5–5.1)
SODIUM: 139 mmol/L (ref 135–145)
Total Bilirubin: 0.3 mg/dL (ref 0.3–1.2)
Total Protein: 7.1 g/dL (ref 6.5–8.1)

## 2015-04-04 LAB — CBC WITH DIFFERENTIAL/PLATELET
BASOS ABS: 0 10*3/uL (ref 0–0.1)
Basophils Relative: 1 %
Eosinophils Absolute: 0.1 10*3/uL (ref 0–0.7)
Eosinophils Relative: 2 %
HCT: 37.6 % (ref 35.0–47.0)
HEMOGLOBIN: 12.4 g/dL (ref 12.0–16.0)
LYMPHS ABS: 1.6 10*3/uL (ref 1.0–3.6)
Lymphocytes Relative: 30 %
MCH: 28.8 pg (ref 26.0–34.0)
MCHC: 33 g/dL (ref 32.0–36.0)
MCV: 87.5 fL (ref 80.0–100.0)
Monocytes Absolute: 0.3 10*3/uL (ref 0.2–0.9)
Monocytes Relative: 6 %
NEUTROS ABS: 3.3 10*3/uL (ref 1.4–6.5)
NEUTROS PCT: 61 %
Platelets: 170 10*3/uL (ref 150–440)
RBC: 4.3 MIL/uL (ref 3.80–5.20)
RDW: 14.6 % — ABNORMAL HIGH (ref 11.5–14.5)
WBC: 5.4 10*3/uL (ref 3.6–11.0)

## 2015-04-04 LAB — URINALYSIS COMPLETE WITH MICROSCOPIC (ARMC ONLY)
Bacteria, UA: NONE SEEN
Bilirubin Urine: NEGATIVE
Glucose, UA: >500 mg/dL — AB
Hgb urine dipstick: NEGATIVE
KETONES UR: NEGATIVE mg/dL
Leukocytes, UA: NEGATIVE
Nitrite: NEGATIVE
PH: 5 (ref 5.0–8.0)
PROTEIN: NEGATIVE mg/dL
Specific Gravity, Urine: 1.021 (ref 1.005–1.030)

## 2015-04-04 LAB — TROPONIN I

## 2015-04-04 LAB — LIPASE, BLOOD: Lipase: 26 U/L (ref 22–51)

## 2015-04-04 MED ORDER — HYDROMORPHONE HCL 1 MG/ML IJ SOLN
1.0000 mg | INTRAMUSCULAR | Status: AC
  Administered 2015-04-04: 1 mg via INTRAVENOUS

## 2015-04-04 MED ORDER — HYDROMORPHONE HCL 1 MG/ML IJ SOLN
INTRAMUSCULAR | Status: DC
Start: 2015-04-04 — End: 2015-04-04
  Filled 2015-04-04: qty 1

## 2015-04-04 MED ORDER — MORPHINE SULFATE (PF) 4 MG/ML IV SOLN
4.0000 mg | Freq: Once | INTRAVENOUS | Status: AC
  Administered 2015-04-04: 4 mg via INTRAVENOUS
  Filled 2015-04-04: qty 1

## 2015-04-04 MED ORDER — ONDANSETRON HCL 4 MG/2ML IJ SOLN
4.0000 mg | Freq: Once | INTRAMUSCULAR | Status: AC
  Administered 2015-04-04: 4 mg via INTRAVENOUS
  Filled 2015-04-04: qty 2

## 2015-04-04 NOTE — ED Notes (Signed)
Patient to ED with report of right side chest/rib pain for the last several days. Reports pain is significantly worse with a deep breath or with movement.

## 2015-04-04 NOTE — ED Provider Notes (Signed)
[facility] Emergency Department Provider Note  Time seen: 7:43 AM  I have reviewed the triage vital signs and the nursing notes.   HISTORY  Chief Complaint Chest Pain    HPI Marissa [name] is a 39 y.o. female with a past medical history of hypertension, arthritis, myocardial infarction, gastric reflux, hyperlipidemia, chronic neck and back pain, presents the emergency department with right-sided chest pain. According to the patient for the past 4-5 days he has had intermittent right-sided chest pain/right upper quadrant abdominal pain. Has not noted any association with food. States some nausea but denies vomiting diaphoresis or shortness of breath. Worse with movement or deep breath. Patient has a history of a myocardial infarction in the past per patient, but denies any stents. Describes her pain as moderate currently, dull/aching.     Past Medical History  Diagnosis Date  . Hypertension   . Coronary artery disease   . Neuropathy   . Headache(784.0)   . Arthritis   . Vision loss     due to diabetes  . Myocardial infarction 2006    "due to medication"; no evidence of ischemia or infarction by nuclear stress test '11  . Dysrhythmia     tachycardia  . Diabetes mellitus without complication     fasting cbg 50-140s  . GERD (gastroesophageal reflux disease)     otc meds  . Migraines     once/month maybe - can last up to two weeks  . Hyperlipemia   . Cardiomyopathy, dilated, nonischemic   . MI, old     2006  . Neck pain, chronic   . Back pain, chronic   . Depression   . Anxiety   . Hypercholesteremia   . Allergy   . Anemia   . Prolonged QT interval syndrome     Patient Active Problem List   Diagnosis Date Noted  . Bilateral occipital neuralgia 01/21/2015  . Dizziness 01/04/2015  . Headache 01/04/2015  . DDD (degenerative disc disease), cervical 12/18/2014  . DDD (degenerative disc disease), thoracic 12/18/2014  . DDD (degenerative disc  disease), lumbosacral 12/18/2014  . Neuropathy due to secondary diabetes 12/18/2014  . Migraine 12/18/2014  . Wound infection complicating hardware 67/20/9470  . Nausea alone 02/01/2014  . Abdominal pain, unspecified site 02/01/2014  . Severe sepsis(995.92) 01/30/2014  . History of lumbar laminectomy 01/30/2014  . Acute renal failure 01/30/2014  . Anemia 01/30/2014  . HTN (hypertension) 01/30/2014  . DM (diabetes mellitus), type 2 with renal complications 96/28/3662  . Wound infection 01/25/2014  . Acute respiratory failure with hypoxia 01/25/2014  . Back pain 11/01/2013  . HNP (herniated nucleus pulposus), lumbar 10/29/2013    Past Surgical History  Procedure Laterality Date  . Back surgery  2011    Lumbar   . Cervical fusion  2006  . Salpingoophorectomy Bilateral     1 1997. 2nd 2001  . Cardiac catheterization  07/18/2004    50-60% mid LAD, minor luminal irregularities RCA, normal LM and CX, EF 50-55% [facility])  . Lumbar laminectomy/decompression microdiscectomy Right 05/07/2013    Procedure: Right Lumbar one-two laminectomy;  Surgeon: Elaina Hoops, MD;  Location: [place] [facility]UR[facility] ORS;  Service: Neurosurgery;  Laterality: Right;  . Carpel tunnel Bilateral   . Lumbar laminectomy/decompression microdiscectomy Left 10/29/2013    Procedure: Left Lumbar five-Sacral one Laminectomy;  Surgeon: Elaina Hoops, MD;  Location: [place] NEURO ORS;  Service: Neurosurgery;  Laterality: Left;  . Lumbar wound debridement N/A 01/25/2014  Procedure: LUMBAR WOUND DEBRIDEMENT;  Surgeon: Charlie Pitter, MD;  Location: [place] NEUR[facility] ORS;  Service: Neurosurgery;  Laterality: N/A;  . Lumbar wound debridement N/A 02/25/2014    Procedure: LUMBAR WOUND DEBRIDEMENT;  Surgeon: Elaina Hoops, MD;  Location: [place] NEUR[facility] ORS;  Service: Neurosurgery;  Laterality: N/A;    Current Outpatient Rx  Name  Route  Sig  Dispense  Refill  . amitriptyline (ELAVIL) 100 MG tablet   Oral   Take 100 mg by mouth at bedtime.           . carvedilol (COREG) 25 MG tablet   Oral   Take 1 tablet (25 mg total) by mouth 2 (two) times daily with a meal.   60 tablet   2   . gabapentin (NEURONTIN) 300 MG capsule   Oral   Take 600 mg by mouth 3 (three) times daily.          . insulin aspart (NOVOLOG FLEXPEN) 100 UNIT/ML FlexPen   Subcutaneous   Inject 10-20 Units into the skin 3 (three) times daily with meals. Sliding scale   15 mL   11   . insulin glargine (LANTUS) 100 UNIT/ML injection   Subcutaneous   Inject 16 Units into the skin at bedtime.         . Liraglutide (VICTOZA) 18 MG/3ML SOPN   Subcutaneous   Inject 1.8 mg into the skin daily.         Marland Kitchen lisinopril (PRINIVIL,ZESTRIL) 10 MG tablet   Oral   Take 10 mg by mouth daily.         . nitroGLYCERIN (NITROSTAT) 0.4 MG SL tablet   Sublingual   Place 0.4 mg under the tongue every 5 (five) minutes as needed for chest pain.         Marland Kitchen oxyCODONE (ROXICODONE) 15 MG immediate release tablet      Limit 1/2 - 1 tablet by mouth 4-7 times per day if tolerated   200 tablet   0   . rosuvastatin (CRESTOR) 40 MG tablet   Oral   Take 40 mg by mouth daily.         Marland Kitchen spironolactone (ALDACTONE) 25 MG tablet   Oral   Take 25 mg by mouth daily. 1/2 tablet         . tiZANidine (ZANAFLEX) 4 MG tablet      Limit 1 tab po bid - tid if tolerated   90 tablet   0   . topiramate (TOPAMAX) 100 MG tablet   Oral   Take 100 mg by mouth at bedtime. Take with 25 mg tablet for a 125 mg dose           Allergies Bactrim; Ciprofloxacin; Rocephin; Vancomycin; Azithromycin; and Food  Family History  Problem Relation Age of Onset  . Depression Mother   . Drug abuse Mother   . Early death Mother   . Hypertension Mother   . Varicose Veins Mother   . Diabetes Father   . Early death Father   . Hyperlipidemia Father     Social History Social History  Substance Use Topics  . Smoking status: Never Smoker   . Smokeless tobacco: None  . Alcohol Use: No     Review of Systems Constitutional: Negative for fever. Cardiovascular: Positive for right lower chest pain Respiratory: Negative for shortness of breath. Gastrointestinal: Positive for right upper quadrant abdominal pain, nausea. Negative for vomiting or diarrhea. Genitourinary: Negative for dysuria. Musculoskeletal: Positive for right  posterior chest pain Neurological: Negative for headache  10-point ROS otherwise negative.  ____________________________________________   PHYSICAL EXAM:  VITAL SIGNS: ED Triage Vitals  Enc Vitals Group     BP 04/04/15 0730 141/94 mmHg     Pulse Rate 04/04/15 0730 95     Resp 04/04/15 0730 20     Temp 04/04/15 0730 98.2 F (36.8 C)     Temp Source 04/04/15 0730 Oral     SpO2 04/04/15 0730 100 %     Weight --      Height --      Head Cir --      Peak Flow --      Pain Score 04/04/15 0730 7     Pain Loc --      Pain Edu? --      Excl. in [place]? --     Constitutional: Alert and oriented. Well appearing and in no distress. Eyes: Normal exam ENT   Head: Normocephalic and atraumatic.   Mouth/Throat: Mucous membranes are moist. Cardiovascular: Normal rate, regular rhythm. No murmurs, rubs, or gallops. Respiratory: Normal respiratory effort without tachypnea nor retractions. Breath sounds are clear and equal bilaterally. No wheezes/rales/rhonchi. Gastrointestinal: Soft, moderate right upper quadrant tenderness to palpation below the costal margin, no rebound or guarding. No distention. Musculoskeletal: Nontender with normal range of motion in all extremities. No lower extremity tenderness or edema. Neurologic:  Normal speech and language. No gross focal neurologic deficits Skin:  Skin is warm, dry and intact.  Psychiatric: Mood and affect are normal. Speech and behavior are normal.  ____________________________________________    EKG  EKG reviewed and interpreted by myself shows normal sinus rhythm at 97 bpm, narrow QRS, normal  axis, normal intervals, no concerning ST changes noted.  ____________________________________________    RADIOLOGY  CXR: WNL [place]: gall stones without cholecystitis   ____________________________________________   INITIAL IMPRESSION / ASSESSMENT AND PLAN / ED COURSE  Pertinent labs & imaging results that were available during my care of the patient were reviewed by me and considered in my medical decision making (see chart for details).  Patient right upper quadrant abdominal pain/right lower chest pain. Patient appears much more tender in the right upper quadrant. Denies any prior abdominal surgeries. We will check labs including hepatic function panel. We will treat pain and nausea, and closely monitor in the emergency department.  Patient states her pain is much improved at this time. Labs are within normal limits. Ultrasound consistent with gallstones without signs of cholecystitis. Patient states she continues to have mild pain but is much better than when she came in. She states all the pain is in the right upper quadrant. I clarified with the patient multiple times she is not having any chest pain, her pain is all below the ribs. Her exam, workup is most consistent with biliary colic. I discussed with patient need to follow up with general surgery by calling the number provided for an appointment this week if possible. Patient states she takes oxycodone at home already and does not require any pain medication to be discharged with. Patient has soft, nontender, no lower extremity swelling, vitals are largely within normal limits, do not suspect PE. ____________________________________________   FINAL CLINICAL IMPRESSION(S) / ED DIAGNOSES  Biliary Colic  Harvest Dark, MD 04/04/15 [phone]

## 2015-04-04 NOTE — ED Notes (Signed)
Patient transported to Ultrasound 

## 2015-04-04 NOTE — ED Notes (Signed)
Patient transported to X-ray 

## 2015-04-04 NOTE — Discharge Instructions (Signed)

## 2015-04-04 NOTE — ED Notes (Signed)
Pt reports developed pain to RUQ that radiates to right shoulder blade since last Wednesday. Pt denies any nausea or vomiting, pt reports she feels some shortness of breath with deep breathing. Pt talks tender to touch to RUQ. Pt denies any other symptom, pt talks in complete sentences no respiratory distress noted. Pt reports about a year ago she had sepsis and her organs were giving up. Pt diagnosed then with CHF, pt has history of HTN, DM

## 2015-04-05 ENCOUNTER — Encounter: Payer: Self-pay | Admitting: *Deleted

## 2015-04-05 ENCOUNTER — Telehealth: Payer: Self-pay | Admitting: *Deleted

## 2015-04-05 ENCOUNTER — Telehealth: Payer: Self-pay | Admitting: Pain Medicine

## 2015-04-05 NOTE — Telephone Encounter (Signed)
Patient called in severe RUQ pain and wanted to know what she should do. Patient has been diagnosed with bilary colic in the ED. Directed patient to go immediately to the ED to receive treatment for her pain and diagnostic testing to determine the source of her pain. Patient confirmed understanding of direction and information.

## 2015-04-05 NOTE — Telephone Encounter (Signed)
Had to go to er sun 04-04-15 she is going to have gall bladder surgery and is having quite a lot of pain, could dr crisp increase pain meds or give something different?

## 2015-04-05 NOTE — Telephone Encounter (Signed)
Nurses Please inform patient that she needs to address the increased pain with her surgeon and with her primary care physician Probably best for the surgeon to increase the medication since severe pain may indicate a need for immediate and emergent surgical intervention and I prefer to avoid masking pain at this time which may warrant surgical intervention or other procedures or treatment

## 2015-04-05 NOTE — Telephone Encounter (Signed)
Patient informed. 

## 2015-04-06 ENCOUNTER — Other Ambulatory Visit: Payer: Self-pay | Admitting: Pain Medicine

## 2015-04-07 ENCOUNTER — Encounter: Payer: Self-pay | Admitting: Surgery

## 2015-04-07 ENCOUNTER — Telehealth: Payer: Self-pay | Admitting: Surgery

## 2015-04-07 ENCOUNTER — Encounter
Admission: RE | Admit: 2015-04-07 | Discharge: 2015-04-07 | Disposition: A | Discharge status: Home / Self Care | Payer: PPO | Source: Ambulatory Visit | Attending: Surgery | Admitting: Surgery

## 2015-04-07 ENCOUNTER — Ambulatory Visit (INDEPENDENT_AMBULATORY_CARE_PROVIDER_SITE_OTHER): Payer: PPO | Admitting: Surgery

## 2015-04-07 VITALS — BP 168/79 | HR 101 | Temp 98.2°F | Ht 65.0 in | Wt 209.0 lb

## 2015-04-07 DIAGNOSIS — E785 Hyperlipidemia, unspecified: Secondary | ICD-10-CM | POA: Diagnosis not present

## 2015-04-07 DIAGNOSIS — G4730 Sleep apnea, unspecified: Secondary | ICD-10-CM | POA: Diagnosis not present

## 2015-04-07 DIAGNOSIS — K219 Gastro-esophageal reflux disease without esophagitis: Secondary | ICD-10-CM | POA: Diagnosis not present

## 2015-04-07 DIAGNOSIS — K8000 Calculus of gallbladder with acute cholecystitis without obstruction: Secondary | ICD-10-CM

## 2015-04-07 DIAGNOSIS — Z79899 Other long term (current) drug therapy: Secondary | ICD-10-CM | POA: Diagnosis not present

## 2015-04-07 DIAGNOSIS — E1140 Type 2 diabetes mellitus with diabetic neuropathy, unspecified: Secondary | ICD-10-CM | POA: Diagnosis not present

## 2015-04-07 DIAGNOSIS — F418 Other specified anxiety disorders: Secondary | ICD-10-CM | POA: Diagnosis not present

## 2015-04-07 DIAGNOSIS — R000 Tachycardia, unspecified: Secondary | ICD-10-CM | POA: Diagnosis not present

## 2015-04-07 DIAGNOSIS — Z9049 Acquired absence of other specified parts of digestive tract: Secondary | ICD-10-CM | POA: Diagnosis not present

## 2015-04-07 DIAGNOSIS — Z833 Family history of diabetes mellitus: Secondary | ICD-10-CM | POA: Diagnosis not present

## 2015-04-07 DIAGNOSIS — I428 Other cardiomyopathies: Secondary | ICD-10-CM | POA: Diagnosis not present

## 2015-04-07 DIAGNOSIS — Z881 Allergy status to other antibiotic agents status: Secondary | ICD-10-CM | POA: Diagnosis not present

## 2015-04-07 DIAGNOSIS — I252 Old myocardial infarction: Secondary | ICD-10-CM | POA: Diagnosis not present

## 2015-04-07 DIAGNOSIS — G43909 Migraine, unspecified, not intractable, without status migrainosus: Secondary | ICD-10-CM | POA: Diagnosis not present

## 2015-04-07 DIAGNOSIS — E780 Pure hypercholesterolemia: Secondary | ICD-10-CM | POA: Diagnosis not present

## 2015-04-07 DIAGNOSIS — I10 Essential (primary) hypertension: Secondary | ICD-10-CM | POA: Diagnosis not present

## 2015-04-07 DIAGNOSIS — D649 Anemia, unspecified: Secondary | ICD-10-CM | POA: Diagnosis not present

## 2015-04-07 DIAGNOSIS — Z8249 Family history of ischemic heart disease and other diseases of the circulatory system: Secondary | ICD-10-CM | POA: Diagnosis not present

## 2015-04-07 DIAGNOSIS — Z888 Allergy status to other drugs, medicaments and biological substances status: Secondary | ICD-10-CM | POA: Diagnosis not present

## 2015-04-07 DIAGNOSIS — I2510 Atherosclerotic heart disease of native coronary artery without angina pectoris: Secondary | ICD-10-CM | POA: Diagnosis not present

## 2015-04-07 DIAGNOSIS — K8020 Calculus of gallbladder without cholecystitis without obstruction: Secondary | ICD-10-CM | POA: Diagnosis not present

## 2015-04-07 DIAGNOSIS — Z794 Long term (current) use of insulin: Secondary | ICD-10-CM | POA: Diagnosis not present

## 2015-04-07 DIAGNOSIS — M542 Cervicalgia: Secondary | ICD-10-CM | POA: Diagnosis not present

## 2015-04-07 HISTORY — DX: Sleep apnea, unspecified: G47.30

## 2015-04-07 HISTORY — DX: Pneumonia, unspecified organism: J18.9

## 2015-04-07 LAB — BASIC METABOLIC PANEL
Anion gap: 7 (ref 5–15)
BUN: 11 mg/dL (ref 6–20)
CALCIUM: 9.5 mg/dL (ref 8.9–10.3)
CO2: 29 mmol/L (ref 22–32)
CREATININE: 0.86 mg/dL (ref 0.44–1.00)
Chloride: 104 mmol/L (ref 101–111)
GFR calc Af Amer: >60 mL/min (ref >60)
GLUCOSE: 243 mg/dL — AB (ref 65–99)
Potassium: 3.9 mmol/L (ref 3.5–5.1)
SODIUM: 140 mmol/L (ref 135–145)

## 2015-04-07 MED ORDER — SODIUM CHLORIDE 0.9 % IV SOLN: 3.0000 g | Freq: Four times a day (QID) | INTRAVENOUS | Status: DC

## 2015-04-07 NOTE — Telephone Encounter (Signed)
Pt advised of pre op date/time and sx date. Sx: 04/08/15 with Dr Laqueta Carina chole w gram Pre op: 04/07/15 @ 1:00pm--office.   Pt was advised in the office at the time of her appointment today.   I have sent an authorization request to [facility] for CPT: [id number].

## 2015-04-07 NOTE — Patient Instructions (Signed)
  Your procedure is scheduled on: April 08, 2015 (Thursday) Report to Day Surgery.[facility]) To find out your arrival time please call [phone] between 1PM - 3PM on (Arrival Time 7:45 am).  Remember: Instructions that are not followed completely may result in serious medical risk, up to and including death, or upon the discretion of your surgeon and anesthesiologist your surgery may need to be rescheduled.    __x__ 1. Do not eat food or drink liquids after midnight. No gum chewing or hard candies.     __x__ 2. No Alcohol for 24 hours before or after surgery.   ____ 3. Bring all medications with you on the day of surgery if instructed.    __x__ 4. Notify your doctor if there is any change in your medical condition     (cold, fever, infections).     Do not wear jewelry, make-up, hairpins, clips or nail polish.  Do not wear lotions, powders, or perfumes. You may wear deodorant.  Do not shave 48 hours prior to surgery. Men may shave face and neck.  Do not bring valuables to the hospital.    [facility] is not responsible for any belongings or valuables.               Contacts, dentures or bridgework may not be worn into surgery.  Leave your suitcase in the car. After surgery it may be brought to your room.  For patients admitted to the hospital, discharge time is determined by your                treatment team.   Patients discharged the day of surgery will not be allowed to drive home.   Please read over the following fact sheets that you were given:   Surgical Site Infection Prevention   ____ Take these medicines the morning of surgery with A SIP OF WATER:    1. Carvedilol  2. Gabapentin 3.   4.  5.  6.  ____ Fleet Enema (as directed)   _x__ Use CHG Soap as directed  ____ Use inhalers on the day of surgery  ____ Stop metformin 2 days prior to surgery    __x__ Take 1/2 of usual insulin dose the night before surgery and none on the morning of surgery. (TAKE  ONE-HALF OF LANTUS AT BEDTIME AND NO INSULIN MORNING OF SURGERY) ____ Stop Coumadin/Plavix/aspirin on   ____ Stop Anti-inflammatories on    ____ Stop supplements until after surgery.    ____ Bring C-Pap to the hospital.

## 2015-04-07 NOTE — Pre-Procedure Instructions (Signed)
Dr. Laureen Abrahams notified of patient Met B results (glucose) on August 21--stated to inform him of results repeated today and will recheck blood sugar the morning of surgery

## 2015-04-07 NOTE — Progress Notes (Signed)
Patient ID: Marissa [name], female   DOB: 1976/02/27, 39 y.o.   MRN: [id number]  Chief Complaint  Patient presents with  . Abdominal Pain    surgical consult    HPI  Marissa [name] is a 39 y.o. female.  With a complex past medical history including renal failure sepsis, myocardial infarction and complex back surgery resulting in significant wound infection who presented to the emergency room on Sunday with a 5 day history of persistent right upper quadrant abdominal pain with 1 episode of nausea and vomiting. Liver function tests lipase and white count was normal in the emergency room and ultrasound performed on Sunday demonstrated large stones but no evidence of acute cholecystitis namely gallbladder wall thickening or pericholecystic fluid. With some hydration narcotics and antibiotics in the emergency room she had symptomatic relief however was still having pain over the course of the last several days. She saw her chronic pain specialist who told her that she likely needed surgery because she was continuing to have right upper quadrant abdominal pain. She\'s had a prior laparoscopic nephrectomy. She denies any prior history of postprandial abdominal pain or fatty food intolerance.  Past Medical History  Diagnosis Date  . Hypertension   . Coronary artery disease   . Neuropathy   . Headache(784.0)   . Arthritis   . Vision loss     due to diabetes  . Myocardial infarction 2006    "due to medication"; no evidence of ischemia or infarction by nuclear stress test \'11  . Dysrhythmia     tachycardia  . Diabetes mellitus without complication     fasting cbg 50-140s  . GERD (gastroesophageal reflux disease)     otc meds  . Migraines     once/month maybe - can last up to two weeks  . Hyperlipemia   . Cardiomyopathy, dilated, nonischemic   . MI, old     20 06  . Neck pain, chronic   . Back pain, chronic   . Depression   . Anxiety   . Hypercholesteremia   . Allergy   . Anemia   .  Prolonged QT interval syndrome   . Cholelithiasis     Past Surgical History  Procedure Laterality Date  . Back surgery  2011    Lumbar   . Cervical fusion  2006  . Salpingoophorectomy Bilateral     1 1997. 2nd 2001  . Cardiac catheterization  07/18/2004    50-60% mid LAD, minor luminal irregularities RCA, normal LM and CX, EF 50-55% [facility])  . Lumbar laminectomy/decompression microdiscectomy Right 05/07/2013    Procedure: Right Lumbar one-two laminectomy;  Surgeon: Elaina Hoops, MD;  Location: [place] [facility];  Service: Neurosurgery;  Laterality: Right;  . Carpel tunnel Bilateral   . Lumbar laminectomy/decompression microdiscectomy Left 10/29/2013    Procedure: Left Lumbar five-Sacral one Laminectomy;  Surgeon: Elaina Hoops, MD;  Location: [place] [facility];  Service: Neurosurgery;  Laterality: Left;  . Lumbar wound debridement N/A 01/25/2014    Procedure: LUMBAR WOUND DEBRIDEMENT;  Surgeon: Charlie Pitter, MD;  Location: [place] [facility];  Service: Neurosurgery;  Laterality: N/A;  . Lumbar wound debridement N/A 02/25/2014    Procedure: LUMBAR WOUND DEBRIDEMENT;  Surgeon: Elaina Hoops, MD;  Location: [place] [facility];  Service: Neurosurgery;  Laterality: N/A;    Family History  Problem Relation Age of Onset  . Depression Mother   . Drug abuse Mother   . Early death Mother   .  Hypertension Mother   . Varicose Veins Mother   . Diabetes Father   . Early death Father   . Hyperlipidemia Father     Social History Social History  Substance Use Topics  . Smoking status: Never Smoker   . Smokeless tobacco: Never Used  . Alcohol Use: No    Allergies  Allergen Reactions  . Bactrim [Sulfamethoxazole-Trimethoprim] Nausea And Vomiting  . Ciprofloxacin Swelling  . Rocephin [Ceftriaxone Sodium In Dextrose] Nausea And Vomiting  . Vancomycin Nausea And Vomiting  . Azithromycin Itching and Rash  . Food Itching and Rash    "Greek yogurt"    Current Outpatient Prescriptions  Medication  Sig Dispense Refill  . amitriptyline (ELAVIL) 100 MG tablet Take 100 mg by mouth at bedtime.     . carvedilol (COREG) 25 MG tablet Take 1 tablet (25 mg total) by mouth 2 (two) times daily with a meal. 60 tablet 2  . gabapentin (NEURONTIN) 300 MG capsule Take 600 mg by mouth 3 (three) times daily.     . insulin aspart (NOVOLOG FLEXPEN) 100 UNIT/ML FlexPen Inject 10-20 Units into the skin 3 (three) times daily with meals. Sliding scale 15 mL 11  . insulin glargine (LANTUS) 100 UNIT/ML injection Inject 16 Units into the skin at bedtime.    . Liraglutide (VICTOZA) 18 MG/3ML SOPN Inject 1.8 mg into the skin daily.    Marland Kitchen lisinopril (PRINIVIL,ZESTRIL) 10 MG tablet Take 10 mg by mouth daily.    . nitroGLYCERIN (NITROSTAT) 0.4 MG SL tablet Place 0.4 mg under the tongue every 5 (five) minutes as needed for chest pain.    Marland Kitchen oxyCODONE (ROXICODONE) 15 MG immediate release tablet Limit 1/2 - 1 tablet by mouth 4-7 times per day if tolerated 200 tablet 0  . rosuvastatin (CRESTOR) 40 MG tablet Take 40 mg by mouth daily.    Marland Kitchen spironolactone (ALDACTONE) 25 MG tablet Take 25 mg by mouth daily. 1/2 tablet    . tiZANidine (ZANAFLEX) 4 MG tablet Limit 1 tab po bid - tid if tolerated 90 tablet 0  . topiramate (TOPAMAX) 100 MG tablet Take 100 mg by mouth at bedtime. Take with 25 mg tablet for a 125 mg dose     No current facility-administered medications for this visit.     Blood pressure 168/79, pulse 101, temperature 98.2 F (36.8 C), temperature source Oral, height 5\' 5"  (1.651 m), weight 209 lb (94.802 kg), last menstrual period 12/21/2014.  Review of Systems  Constitutional: Negative for fever, chills, weight loss, malaise/fatigue and diaphoresis.  HENT: Negative.   Eyes: Negative.   Respiratory: Negative.   Cardiovascular: Negative for chest pain, palpitations and orthopnea.  Gastrointestinal: Positive for nausea, vomiting and abdominal pain. Negative for heartburn, diarrhea and blood in stool.    Musculoskeletal: Positive for back pain. Negative for joint pain.  Skin: Negative.   Neurological: Negative for weakness.  Endo/Heme/Allergies: Negative.   All other systems reviewed and are negative.   Physical Exam  Constitutional: She is oriented to person, place, and time and well-developed, well-nourished, and in no distress. No distress.  Abdominal: Soft. Normal appearance and bowel sounds are normal. She exhibits no distension. There is no hepatosplenomegaly. There is tenderness in the right upper quadrant. There is positive Murphy's sign. There is no rebound and no CVA tenderness. No hernia. Hernia confirmed negative in the umbilical area.    Musculoskeletal: Normal range of motion.  Neurological: She is oriented to person, place, and time.  Skin: Skin is warm  and dry. She is not diaphoretic.     Psychiatric: Mood, memory, affect and judgment normal.     Data Reviewed I personally reviewed the ultrasound images as well as the laboratory values and ER staff notes from the visit on Sunday.   I have personally reviewed the patient's imaging, laboratory findings and medical records.    Assessment    39 year old female with type 1 diabetes of long-standing duration as well as gallstones with a week worth of abdominal pain and likely cholecystitis.     Plan      I spoke with my associate Dr. Burt Knack who has availability tomorrow in the outpatient schedule to perform laparoscopic cholecystectomy. I went over with her briefly the risks of surgery including that of bleeding infection need for conversion open operation 1 and 200 risk of bile duct injury need for future ERCP or papillotomy. All of her questions were answered and I'll allow Dr. Burt Knack to review this with her in the morning.  Time spent with patient was 70 minutes, with more than 50% of the time spent counseling and coordinating care of patient.     Sherri Rad MD, FACS 04/07/2015, 9:57 AM

## 2015-04-08 ENCOUNTER — Ambulatory Visit: Payer: PPO | Admitting: Anesthesiology

## 2015-04-08 ENCOUNTER — Encounter
Admission: AD | Disposition: A | Discharge status: Home / Self Care | Payer: Self-pay | Source: Ambulatory Visit | Attending: Surgery

## 2015-04-08 ENCOUNTER — Encounter: Payer: Self-pay | Admitting: *Deleted

## 2015-04-08 ENCOUNTER — Observation Stay
Admission: AD | Admit: 2015-04-08 | Discharge: 2015-04-09 | Disposition: A | Discharge status: Home / Self Care | Payer: PPO | Source: Ambulatory Visit | Attending: Surgery | Admitting: Surgery

## 2015-04-08 DIAGNOSIS — R000 Tachycardia, unspecified: Secondary | ICD-10-CM | POA: Insufficient documentation

## 2015-04-08 DIAGNOSIS — K219 Gastro-esophageal reflux disease without esophagitis: Secondary | ICD-10-CM | POA: Insufficient documentation

## 2015-04-08 DIAGNOSIS — Z794 Long term (current) use of insulin: Secondary | ICD-10-CM | POA: Insufficient documentation

## 2015-04-08 DIAGNOSIS — D649 Anemia, unspecified: Secondary | ICD-10-CM | POA: Insufficient documentation

## 2015-04-08 DIAGNOSIS — E780 Pure hypercholesterolemia: Secondary | ICD-10-CM | POA: Insufficient documentation

## 2015-04-08 DIAGNOSIS — K8020 Calculus of gallbladder without cholecystitis without obstruction: Secondary | ICD-10-CM | POA: Diagnosis not present

## 2015-04-08 DIAGNOSIS — G43909 Migraine, unspecified, not intractable, without status migrainosus: Secondary | ICD-10-CM | POA: Insufficient documentation

## 2015-04-08 DIAGNOSIS — E1140 Type 2 diabetes mellitus with diabetic neuropathy, unspecified: Secondary | ICD-10-CM | POA: Insufficient documentation

## 2015-04-08 DIAGNOSIS — Z9049 Acquired absence of other specified parts of digestive tract: Secondary | ICD-10-CM | POA: Insufficient documentation

## 2015-04-08 DIAGNOSIS — I10 Essential (primary) hypertension: Secondary | ICD-10-CM | POA: Insufficient documentation

## 2015-04-08 DIAGNOSIS — Z833 Family history of diabetes mellitus: Secondary | ICD-10-CM | POA: Insufficient documentation

## 2015-04-08 DIAGNOSIS — K8050 Calculus of bile duct without cholangitis or cholecystitis without obstruction: Secondary | ICD-10-CM | POA: Diagnosis not present

## 2015-04-08 DIAGNOSIS — M542 Cervicalgia: Secondary | ICD-10-CM | POA: Insufficient documentation

## 2015-04-08 DIAGNOSIS — F418 Other specified anxiety disorders: Secondary | ICD-10-CM | POA: Insufficient documentation

## 2015-04-08 DIAGNOSIS — Z8249 Family history of ischemic heart disease and other diseases of the circulatory system: Secondary | ICD-10-CM | POA: Insufficient documentation

## 2015-04-08 DIAGNOSIS — G4730 Sleep apnea, unspecified: Secondary | ICD-10-CM | POA: Insufficient documentation

## 2015-04-08 DIAGNOSIS — I252 Old myocardial infarction: Secondary | ICD-10-CM | POA: Insufficient documentation

## 2015-04-08 DIAGNOSIS — Z888 Allergy status to other drugs, medicaments and biological substances status: Secondary | ICD-10-CM | POA: Insufficient documentation

## 2015-04-08 DIAGNOSIS — Z881 Allergy status to other antibiotic agents status: Secondary | ICD-10-CM | POA: Insufficient documentation

## 2015-04-08 DIAGNOSIS — E785 Hyperlipidemia, unspecified: Secondary | ICD-10-CM | POA: Insufficient documentation

## 2015-04-08 DIAGNOSIS — I2510 Atherosclerotic heart disease of native coronary artery without angina pectoris: Secondary | ICD-10-CM | POA: Insufficient documentation

## 2015-04-08 DIAGNOSIS — Z79899 Other long term (current) drug therapy: Secondary | ICD-10-CM | POA: Insufficient documentation

## 2015-04-08 DIAGNOSIS — I428 Other cardiomyopathies: Secondary | ICD-10-CM | POA: Insufficient documentation

## 2015-04-08 HISTORY — PX: CHOLECYSTECTOMY: SHX55

## 2015-04-08 LAB — GLUCOSE, CAPILLARY
GLUCOSE-CAPILLARY: 84 mg/dL (ref 65–99)
Glucose-Capillary: 134 mg/dL — ABNORMAL HIGH (ref 65–99)
Glucose-Capillary: 103 mg/dL — ABNORMAL HIGH (ref 65–99)
Glucose-Capillary: 135 mg/dL — ABNORMAL HIGH (ref 65–99)
Glucose-Capillary: 216 mg/dL — ABNORMAL HIGH (ref 65–99)

## 2015-04-08 SURGERY — LAPAROSCOPIC CHOLECYSTECTOMY WITH INTRAOPERATIVE CHOLANGIOGRAM
Anesthesia: General | Wound class: Clean

## 2015-04-08 MED ORDER — TOPIRAMATE 100 MG PO TABS
100.0000 mg | ORAL_TABLET | Freq: Every day | ORAL | Status: DC
  Administered 2015-04-08: 100 mg via ORAL
  Filled 2015-04-08: qty 1

## 2015-04-08 MED ORDER — FENTANYL CITRATE (PF) 100 MCG/2ML IJ SOLN
INTRAMUSCULAR | Status: DC | PRN
  Administered 2015-04-08: 150 ug via INTRAVENOUS
  Administered 2015-04-08: 100 ug via INTRAVENOUS

## 2015-04-08 MED ORDER — ONDANSETRON HCL 4 MG/2ML IJ SOLN
INTRAMUSCULAR | Status: AC
  Filled 2015-04-08: qty 2

## 2015-04-08 MED ORDER — MORPHINE SULFATE (PF) 2 MG/ML IV SOLN
2.0000 mg | INTRAVENOUS | Status: DC | PRN
  Administered 2015-04-08 (×2): 2 mg via INTRAVENOUS
  Filled 2015-04-08 (×2): qty 1

## 2015-04-08 MED ORDER — FENTANYL CITRATE (PF) 100 MCG/2ML IJ SOLN
25.0000 ug | INTRAMUSCULAR | Status: DC | PRN
  Administered 2015-04-08 (×4): 25 ug via INTRAVENOUS

## 2015-04-08 MED ORDER — SODIUM CHLORIDE 0.9 % IV SOLN
INTRAVENOUS | Status: DC
  Administered 2015-04-08: 75 mL/h via INTRAVENOUS

## 2015-04-08 MED ORDER — OXYCODONE HCL 5 MG PO TABS
15.0000 mg | ORAL_TABLET | Freq: Four times a day (QID) | ORAL | Status: DC | PRN
  Administered 2015-04-08 – 2015-04-09 (×2): 15 mg via ORAL
  Filled 2015-04-08 (×2): qty 3

## 2015-04-08 MED ORDER — SODIUM CHLORIDE 0.9 % IV SOLN
3.0000 g | Freq: Once | INTRAVENOUS | Status: AC
  Administered 2015-04-08: 3 g via INTRAVENOUS
  Filled 2015-04-08: qty 3

## 2015-04-08 MED ORDER — AMITRIPTYLINE HCL 50 MG PO TABS
100.0000 mg | ORAL_TABLET | Freq: Every day | ORAL | Status: DC
  Administered 2015-04-08: 100 mg via ORAL
  Filled 2015-04-08: qty 1
  Filled 2015-04-08: qty 2

## 2015-04-08 MED ORDER — INSULIN GLARGINE 100 UNIT/ML ~~LOC~~ SOLN
16.0000 [IU] | Freq: Every day | SUBCUTANEOUS | Status: DC
  Administered 2015-04-08: 16 [IU] via SUBCUTANEOUS
  Filled 2015-04-08 (×2): qty 0.16

## 2015-04-08 MED ORDER — GABAPENTIN 300 MG PO CAPS
600.0000 mg | ORAL_CAPSULE | Freq: Three times a day (TID) | ORAL | Status: DC
  Administered 2015-04-08 – 2015-04-09 (×4): 600 mg via ORAL
  Filled 2015-04-08 (×4): qty 2

## 2015-04-08 MED ORDER — DEXMEDETOMIDINE HCL 200 MCG/2ML IV SOLN
INTRAVENOUS | Status: DC | PRN
  Administered 2015-04-08: 12 ug via INTRAVENOUS

## 2015-04-08 MED ORDER — ROCURONIUM BROMIDE 100 MG/10ML IV SOLN
INTRAVENOUS | Status: DC | PRN
  Administered 2015-04-08: 35 mg via INTRAVENOUS

## 2015-04-08 MED ORDER — BUPIVACAINE-EPINEPHRINE (PF) 0.25% -1:200000 IJ SOLN
INTRAMUSCULAR | Status: DC | PRN
  Administered 2015-04-08: 30 mL via PERINEURAL

## 2015-04-08 MED ORDER — ONDANSETRON HCL 4 MG/2ML IJ SOLN
INTRAMUSCULAR | Status: DC | PRN
  Administered 2015-04-08: 4 mg via INTRAVENOUS

## 2015-04-08 MED ORDER — ONDANSETRON HCL 4 MG/2ML IJ SOLN
4.0000 mg | Freq: Four times a day (QID) | INTRAMUSCULAR | Status: DC | PRN
  Administered 2015-04-08: 4 mg via INTRAVENOUS

## 2015-04-08 MED ORDER — CHLORHEXIDINE GLUCONATE 4 % EX LIQD
1.0000 | Freq: Once | CUTANEOUS | Status: DC
Start: 2015-04-09 — End: 2015-04-08

## 2015-04-08 MED ORDER — FAMOTIDINE 20 MG PO TABS
ORAL_TABLET | ORAL | Status: AC
  Administered 2015-04-08: 20 mg via ORAL
  Filled 2015-04-08: qty 1

## 2015-04-08 MED ORDER — INSULIN ASPART 100 UNIT/ML FLEXPEN: 10.0000 [IU] | PEN_INJECTOR | Freq: Three times a day (TID) | SUBCUTANEOUS | Status: DC

## 2015-04-08 MED ORDER — TIZANIDINE HCL 4 MG PO TABS
4.0000 mg | ORAL_TABLET | Freq: Every day | ORAL | Status: DC
  Administered 2015-04-08: 4 mg via ORAL
  Filled 2015-04-08: qty 1

## 2015-04-08 MED ORDER — INSULIN ASPART 100 UNIT/ML ~~LOC~~ SOLN
0.0000 [IU] | Freq: Every day | SUBCUTANEOUS | Status: DC
  Administered 2015-04-08: 2 [IU] via SUBCUTANEOUS
  Filled 2015-04-08: qty 2

## 2015-04-08 MED ORDER — PROPOFOL 10 MG/ML IV BOLUS
INTRAVENOUS | Status: DC | PRN
  Administered 2015-04-08: 200 mg via INTRAVENOUS

## 2015-04-08 MED ORDER — CARVEDILOL 25 MG PO TABS
25.0000 mg | ORAL_TABLET | Freq: Two times a day (BID) | ORAL | Status: DC
  Administered 2015-04-08 – 2015-04-09 (×2): 25 mg via ORAL
  Filled 2015-04-08 (×3): qty 1

## 2015-04-08 MED ORDER — LACTATED RINGERS IV SOLN
INTRAVENOUS | Status: DC
  Administered 2015-04-08 – 2015-04-09 (×4): via INTRAVENOUS

## 2015-04-08 MED ORDER — GLYCOPYRROLATE 0.2 MG/ML IJ SOLN
INTRAMUSCULAR | Status: DC | PRN
  Administered 2015-04-08: .5 mg via INTRAVENOUS

## 2015-04-08 MED ORDER — LIDOCAINE HCL (CARDIAC) 20 MG/ML IV SOLN
INTRAVENOUS | Status: DC | PRN
  Administered 2015-04-08: 100 mg via INTRAVENOUS

## 2015-04-08 MED ORDER — BUPIVACAINE-EPINEPHRINE (PF) 0.25% -1:200000 IJ SOLN
INTRAMUSCULAR | Status: AC
  Filled 2015-04-08: qty 30

## 2015-04-08 MED ORDER — PHENYLEPHRINE HCL 10 MG/ML IJ SOLN
INTRAMUSCULAR | Status: DC | PRN
  Administered 2015-04-08: 150 ug via INTRAVENOUS

## 2015-04-08 MED ORDER — NEOSTIGMINE METHYLSULFATE 10 MG/10ML IV SOLN
INTRAVENOUS | Status: DC | PRN
  Administered 2015-04-08: 3 mg via INTRAVENOUS

## 2015-04-08 MED ORDER — MIDAZOLAM HCL 2 MG/2ML IJ SOLN
INTRAMUSCULAR | Status: DC | PRN
  Administered 2015-04-08: 2 mg via INTRAVENOUS

## 2015-04-08 MED ORDER — LABETALOL HCL 5 MG/ML IV SOLN
INTRAVENOUS | Status: DC | PRN
  Administered 2015-04-08: 5 mg via INTRAVENOUS

## 2015-04-08 MED ORDER — KETOROLAC TROMETHAMINE 30 MG/ML IJ SOLN
INTRAMUSCULAR | Status: DC | PRN
Start: 2015-04-08 — End: 2015-04-08
  Administered 2015-04-08: 30 mg via INTRAVENOUS

## 2015-04-08 MED ORDER — NITROGLYCERIN 0.4 MG SL SUBL: 0.4000 mg | SUBLINGUAL_TABLET | SUBLINGUAL | Status: DC | PRN

## 2015-04-08 MED ORDER — FAMOTIDINE 20 MG PO TABS
20.0000 mg | ORAL_TABLET | Freq: Once | ORAL | Status: AC
  Administered 2015-04-08: 20 mg via ORAL

## 2015-04-08 MED ORDER — ONDANSETRON HCL 4 MG PO TABS: 4.0000 mg | ORAL_TABLET | Freq: Four times a day (QID) | ORAL | Status: DC | PRN

## 2015-04-08 MED ORDER — LISINOPRIL 10 MG PO TABS
10.0000 mg | ORAL_TABLET | Freq: Every day | ORAL | Status: DC
  Administered 2015-04-08: 10 mg via ORAL
  Filled 2015-04-08: qty 1

## 2015-04-08 MED ORDER — HEPARIN SODIUM (PORCINE) 5000 UNIT/ML IJ SOLN
5000.0000 [IU] | Freq: Three times a day (TID) | INTRAMUSCULAR | Status: DC
  Administered 2015-04-08 – 2015-04-09 (×4): 5000 [IU] via SUBCUTANEOUS
  Filled 2015-04-08 (×4): qty 1

## 2015-04-08 MED ORDER — OXYCODONE HCL 5 MG PO TABS: 5.0000 mg | ORAL_TABLET | ORAL | Status: DC | PRN

## 2015-04-08 MED ORDER — FENTANYL CITRATE (PF) 100 MCG/2ML IJ SOLN
INTRAMUSCULAR | Status: AC
  Administered 2015-04-08: 25 ug via INTRAVENOUS
  Filled 2015-04-08: qty 2

## 2015-04-08 MED ORDER — LIRAGLUTIDE 18 MG/3ML ~~LOC~~ SOPN: 1.8000 mg | PEN_INJECTOR | Freq: Every day | SUBCUTANEOUS | Status: DC

## 2015-04-08 MED ORDER — ONDANSETRON HCL 4 MG/2ML IJ SOLN
4.0000 mg | Freq: Once | INTRAMUSCULAR | Status: AC | PRN
  Administered 2015-04-08: 4 mg via INTRAVENOUS

## 2015-04-08 MED ORDER — INSULIN ASPART 100 UNIT/ML ~~LOC~~ SOLN
0.0000 [IU] | Freq: Three times a day (TID) | SUBCUTANEOUS | Status: DC
  Administered 2015-04-08: 1 [IU] via SUBCUTANEOUS
  Administered 2015-04-09 (×2): 3 [IU] via SUBCUTANEOUS
  Administered 2015-04-09: 2 [IU] via SUBCUTANEOUS
  Filled 2015-04-08: qty 1
  Filled 2015-04-08: qty 3
  Filled 2015-04-08: qty 2
  Filled 2015-04-08: qty 3

## 2015-04-08 MED ORDER — MORPHINE SULFATE (PF) 2 MG/ML IV SOLN
2.0000 mg | INTRAVENOUS | Status: DC | PRN
  Administered 2015-04-08 (×4): 4 mg via INTRAVENOUS
  Administered 2015-04-09 (×2): 2 mg via INTRAVENOUS
  Administered 2015-04-09: 4 mg via INTRAVENOUS
  Filled 2015-04-08 (×6): qty 2
  Filled 2015-04-08: qty 1

## 2015-04-08 SURGICAL SUPPLY — 41 items
ADHESIVE MASTISOL STRL (MISCELLANEOUS) ×2 IMPLANT
APPLIER CLIP ROT 10 11.4 M/L (STAPLE) ×2
BLADE SURG SZ11 CARB STEEL (BLADE) ×2 IMPLANT
CANISTER SUCT 1200ML W/VALVE (MISCELLANEOUS) ×2 IMPLANT
CATH CHOLANGI 4FR 420404F (CATHETERS) IMPLANT
CHLORAPREP W/TINT 26ML (MISCELLANEOUS) ×2 IMPLANT
CLIP APPLIE ROT 10 11.4 M/L (STAPLE) ×1 IMPLANT
CONRAY 60ML FOR OR (MISCELLANEOUS) ×2 IMPLANT
DRAPE C-ARM XRAY 36X54 (DRAPES) ×2 IMPLANT
ENDOPOUCH RETRIEVER 10 (MISCELLANEOUS) ×2 IMPLANT
GAUZE SPONGE NON-WVN 2X2 STRL (MISCELLANEOUS) ×4 IMPLANT
GLOVE BIO SURGEON STRL SZ8 (GLOVE) ×2 IMPLANT
GOWN STRL REUS W/ TWL LRG LVL3 (GOWN DISPOSABLE) ×4 IMPLANT
GOWN STRL REUS W/TWL LRG LVL3 (GOWN DISPOSABLE) ×4
IRRIGATION STRYKERFLOW (MISCELLANEOUS) ×1 IMPLANT
IRRIGATOR STRYKERFLOW (MISCELLANEOUS) ×2
IV CATH ANGIO 12GX3 LT BLUE (NEEDLE) IMPLANT
IV NS 1000ML (IV SOLUTION) ×1
IV NS 1000ML BAXH (IV SOLUTION) ×1 IMPLANT
JACKSON PRATT 10 (INSTRUMENTS) IMPLANT
KIT RM TURNOVER STRD PROC AR (KITS) ×2 IMPLANT
LABEL OR SOLS (LABEL) ×2 IMPLANT
NDL SAFETY 22GX1.5 (NEEDLE) ×2 IMPLANT
NEEDLE VERESS 14GA 120MM (NEEDLE) ×2 IMPLANT
NS IRRIG 500ML POUR BTL (IV SOLUTION) ×2 IMPLANT
PACK LAP CHOLECYSTECTOMY (MISCELLANEOUS) ×2 IMPLANT
PAD GROUND ADULT SPLIT (MISCELLANEOUS) ×2 IMPLANT
SCISSORS METZENBAUM CVD 33 (INSTRUMENTS) ×2 IMPLANT
SLEEVE ENDOPATH XCEL 5M (ENDOMECHANICALS) ×2 IMPLANT
SPONGE EXCIL AMD DRAIN 4X4 6P (MISCELLANEOUS) ×2 IMPLANT
SPONGE LAP 18X18 5 PK (GAUZE/BANDAGES/DRESSINGS) ×2 IMPLANT
SPONGE VERSALON 2X2 STRL (MISCELLANEOUS) ×4
STRIP CLOSURE SKIN 1/2X4 (GAUZE/BANDAGES/DRESSINGS) ×2 IMPLANT
SUT MNCRL 4-0 (SUTURE) ×1
SUT MNCRL 4-0 27XMFL (SUTURE) ×1
SUT VICRYL 0 AB UR-6 (SUTURE) ×2 IMPLANT
SUTURE MNCRL 4-0 27XMF (SUTURE) ×1 IMPLANT
SYR 20CC LL (SYRINGE) ×2 IMPLANT
TROCAR XCEL NON-BLD 11X100MML (ENDOMECHANICALS) ×2 IMPLANT
TROCAR XCEL NON-BLD 5MMX100MML (ENDOMECHANICALS) ×4 IMPLANT
TUBING INSUFFLATOR HI FLOW (MISCELLANEOUS) ×2 IMPLANT

## 2015-04-08 NOTE — Progress Notes (Signed)
Preoperative Review   Patient is met in the preoperative holding area. The history is reviewed in the chart and with the patient. I also have discussed this patient's care personally with Dr. Marina Gravel. I personally reviewed the options and rationale as well as the risks of this procedure that have been previously discussed with the patient. All questions asked by the patient and/or family were answered to their satisfaction.  Patient agrees to proceed with this procedure at this time.  Florene Glen M.D. FACS

## 2015-04-08 NOTE — OR Nursing (Signed)
Patient arrived with dermal implants on upper left eyebrow, lip, mid sternum and states they are not removable.  She was at Dr. Juliette Mangle office yesterday and they were made aware and had no comments about this.  She has tongue and breast piercings that will be removed prior to surgery.  Dr. Burt Knack will be notified of the dermal implants.

## 2015-04-08 NOTE — Progress Notes (Signed)
Dr. Burt Knack informed that Diabetes Education Coordinator made a suggestion to alter insulin administration while inpatient, however, Dr. Burt Knack advised that since this is the patient's home medication dose, he will leave as is, as she is staying overnight.  Per his conversation, altering the insulin administration at this time is not relevant.    Will continue to monitor and notify MD of any additional changes or concerns.

## 2015-04-08 NOTE — Progress Notes (Signed)
Inpatient Diabetes Program Recommendations  AACE/ADA: New Consensus Statement on Inpatient Glycemic Control (2013)  Target Ranges:  Prepandial:   less than 140 mg/dL      Peak postprandial:   less than 180 mg/dL (1-2 hours)      Critically ill patients:  140 - 180 mg/dL   Results for DARLIN, STENSETH (MRN [id number]) as of 04/08/2015 10:32  Ref. Range 04/08/2015 07:52 04/08/2015 10:03  Glucose-Capillary Latest Ref Range: 65-99 mg/dL 84 134 (H)    Outpatient Diabetes medications: Lantus 16 units HS, Novolog 10-20 units TID with meals, Victoza 1.8 mg daily Current orders for Inpatient glycemic control: Lantus 16 units HS, Novolog 10-20 units TID with meals, Victoza 1.8 mg daily  Inpatient Diabetes Program Recommendations Correction (SSI): Patient is currently ordered Novolog 10-20 units TID with meals without any specific instructions for RN to use to determine correct dose to be given, Please discontinue this order and instead use the Glycemic Control order set to order Novolog 0-15 units TID with meals for correction and Novolog 0-5 units HS for bedtime correction.  Thanks, Barnie Alderman, RN, MSN, CCRN, CDE Diabetes Coordinator Inpatient Diabetes Program [phone] (Team Pager from [place] to [place]) [phone] ([place] office) [phone] [facility] office) [phone] [facility] office)

## 2015-04-08 NOTE — Op Note (Signed)
Laparoscopic Cholecystectomy  Pre-operative Diagnosis: Biliary colic  Post-operative Diagnosis: Biliary colic  Procedure: Laparoscopic cholecystectomy  Surgeon: Jerrol Banana. Burt Knack, MD FACS  Anesthesia: Gen. with endotracheal tube  Assistant: Surgical tech  Procedure Details  The patient was seen again in the Holding Room. The benefits, complications, treatment options, and expected outcomes were discussed with the patient. The risks of bleeding, infection, recurrence of symptoms, failure to resolve symptoms, bile duct damage, bile duct leak, retained common bile duct stone, bowel injury, any of which could require further surgery and/or ERCP, stent, or papillotomy were reviewed with the patient. The likelihood of improving the patient's symptoms with return to their baseline status is good.  The patient and/or family concurred with the proposed plan, giving informed consent.  The patient was taken to Operating Room, identified as Marissa [name] and the procedure verified as Laparoscopic Cholecystectomy.  A Time Out was held and the above information confirmed.  Prior to the induction of general anesthesia, antibiotic prophylaxis was administered. VTE prophylaxis was in place. General endotracheal anesthesia was then administered and tolerated well. After the induction, the abdomen was prepped with Chloraprep and draped in the sterile fashion. The patient was positioned in the supine position.  Local anesthetic  was injected into the skin near the umbilicus and an incision made. The Veress needle was placed. Pneumoperitoneum was then created with CO2 and tolerated well without any adverse changes in the patient's vital signs. A 40mm port was placed in the periumbilical position and the abdominal cavity was explored.  Extensive adhesions were identified in the periumbilical area but there was no sign of bowel injury or bowel involvement in these adhesions. Movement of the scope around the adhesions  to the right upper quadrant allowed for placement of the following ports. Two 5-mm ports were placed in the right upper quadrant and a 12 mm epigastric port was placed all under direct vision. One of the lateral 5 mm ports appeared to be in a poor position and was relocated cephalad prior to it piercing the fascia. All skin incisions  were infiltrated with a local anesthetic agent before making the incision and placing the trocars.   The patient was positioned  in reverse Trendelenburg, tilted slightly to the patient's left.  The gallbladder was identified, the fundus grasped and retracted cephalad. Adhesions were lysed bluntly. The infundibulum was grasped and retracted laterally, exposing the peritoneum overlying the triangle of Calot. This was then divided and exposed in a blunt fashion. A critical view of the cystic duct and cystic artery was obtained.  The cystic duct was clearly identified and bluntly dissected.   Her extensive adhesions in the area of the gallbladder which were taken down bluntly and then once the cystic duct was well identified it was doubly clipped and divided as was the cystic artery.  The gallbladder was taken from the gallbladder fossa in a retrograde fashion with the electrocautery. The gallbladder was removed and placed in an Endocatch bag. The liver bed was irrigated and inspected. Hemostasis was achieved with the electrocautery. Copious irrigation was utilized and was repeatedly aspirated until clear.  The gallbladder and Endocatch sac were then removed through the epigastric port site.   Inspection of the right upper quadrant was performed. Placement of the camera in both lateral ports and epigastric port looking back at adhesions was performed. This was done to identify any adhesions that may have been damaged or injured during placement of the trochars. The was no sign of bowel  involvement nor bowel injury. No bleeding, bile duct injury or leak, or bowel injury was noted.  Pneumoperitoneum was released.  The epigastric port site was closed with figure-of-eight 0 Vicryl sutures. 4-0 subcuticular Monocryl was used to close the skin. Steristrips and Mastisol and sterile dressings were  applied.  The patient was then extubated and brought to the recovery room in stable condition. Sponge, lap, and needle counts were correct at closure and at the conclusion of the case.   Findings: Chronic Cholecystitis   Estimated Blood Loss: Minimal         Drains: None         Specimens: Gallbladder           Complications: none               Tashima Scarpulla E. Burt Knack, MD, FACS

## 2015-04-08 NOTE — Anesthesia Procedure Notes (Signed)
Procedure Name: Intubation Date/Time: 04/08/2015 9:12 AM Performed by: Rosaria Ferries, Dinorah Masullo Pre-anesthesia Checklist: Patient identified, Emergency Drugs available, Suction available and Patient being monitored Patient Re-evaluated:Patient Re-evaluated prior to inductionOxygen Delivery Method: Circle system utilized Preoxygenation: Pre-oxygenation with 100% oxygen Intubation Type: IV induction Laryngoscope Size: Mac and 3 Grade View: Grade I Tube type: Oral Tube size: 7.0 mm Number of attempts: 1 Placement Confirmation: ETT inserted through vocal cords under direct vision,  positive ETCO2 and breath sounds checked- equal and bilateral Secured at: 21 cm Tube secured with: Tape Dental Injury: Teeth and Oropharynx as per pre-operative assessment

## 2015-04-08 NOTE — Plan of Care (Signed)
Problem: Phase I Progression Outcomes Goal: Other Phase I Outcomes/Goals Outcome: Not Applicable Date Met:  71/82/09 No additional Phase Outcome/Goals identified at this time.

## 2015-04-08 NOTE — Anesthesia Preprocedure Evaluation (Signed)
Anesthesia Evaluation  Patient identified by MRN, date of birth, ID band Patient awake    Reviewed: Allergy & Precautions, NPO status , Patient's Chart, lab work & pertinent test results  History of Anesthesia Complications Negative for: history of anesthetic complications  Airway Mallampati: II  TM Distance: >3 FB Neck ROM: Full    Dental  (+) Partial Lower   Pulmonary sleep apnea (not using CPAP) ,          Cardiovascular hypertension, Pt. on medications and Pt. on home beta blockers + CAD and + Past MI + dysrhythmias (prolonged Q-T)     Neuro/Psych Anxiety Depression    GI/Hepatic GERD-  ,  Endo/Other  diabetes, Type 2, Insulin Dependent  Renal/GU Renal disease (hx of ARF with sepsis)     Musculoskeletal   Abdominal   Peds  Hematology  (+) anemia ,   Anesthesia Other Findings   Reproductive/Obstetrics                             Anesthesia Physical Anesthesia Plan  ASA: II  Anesthesia Plan: General   Post-op Pain Management:    Induction: Intravenous  Airway Management Planned: Oral ETT  Additional Equipment:   Intra-op Plan:   Post-operative Plan:   Informed Consent: I have reviewed the patients History and Physical, chart, labs and discussed the procedure including the risks, benefits and alternatives for the proposed anesthesia with the patient or authorized representative who has indicated his/her understanding and acceptance.     Plan Discussed with:   Anesthesia Plan Comments:         Anesthesia Quick Evaluation

## 2015-04-08 NOTE — Transfer of Care (Signed)
Immediate Anesthesia Transfer of Care Note  Patient: Marissa [name]  Procedure(s) Performed: Procedure(s): LAPAROSCOPIC CHOLECYSTECTOMY WITH INTRAOPERATIVE CHOLANGIOGRAM (N/A)  Patient Location: PACU  Anesthesia Type:General  Level of Consciousness: awake, sedated and patient cooperative  Airway & Oxygen Therapy: Patient Spontanous Breathing and Patient connected to nasal cannula oxygen  Post-op Assessment: Report given to RN and Post -op Vital signs reviewed and stable  Post vital signs: Reviewed and stable  Last Vitals:  Filed Vitals:   04/08/15 0958  BP: 140/92  Temp: 36.2 C  Resp: 15    Complications: No apparent anesthesia complications

## 2015-04-08 NOTE — Anesthesia Postprocedure Evaluation (Signed)
  Anesthesia Post-op Note  Patient: Marissa [name]  Procedure(s) Performed: Procedure(s): LAPAROSCOPIC CHOLECYSTECTOMY WITH INTRAOPERATIVE CHOLANGIOGRAM (N/A)  Anesthesia type:General  Patient location: PACU  Post pain: Pain level controlled  Post assessment: Post-op Vital signs reviewed, Patient's Cardiovascular Status Stable, Respiratory Function Stable, Patent Airway and No signs of Nausea or vomiting  Post vital signs: Reviewed and stable  Last Vitals:  Filed Vitals:   04/08/15 0958  BP: 140/92  Pulse: 82  Temp: 36.2 C  Resp: 15    Level of consciousness: awake, alert  and patient cooperative  Complications: No apparent anesthesia complications

## 2015-04-09 ENCOUNTER — Encounter: Payer: Self-pay | Admitting: Surgery

## 2015-04-09 DIAGNOSIS — K8020 Calculus of gallbladder without cholecystitis without obstruction: Secondary | ICD-10-CM | POA: Diagnosis not present

## 2015-04-09 LAB — COMPREHENSIVE METABOLIC PANEL
ALBUMIN: 3 g/dL — AB (ref 3.5–5.0)
ALT: 24 U/L (ref 14–54)
AST: 38 U/L (ref 15–41)
Alkaline Phosphatase: 81 U/L (ref 38–126)
Anion gap: 8 (ref 5–15)
BUN: 11 mg/dL (ref 6–20)
CHLORIDE: 107 mmol/L (ref 101–111)
CO2: 23 mmol/L (ref 22–32)
CREATININE: 0.78 mg/dL (ref 0.44–1.00)
Calcium: 8.8 mg/dL — ABNORMAL LOW (ref 8.9–10.3)
GFR calc non Af Amer: >60 mL/min (ref >60)
GLUCOSE: 222 mg/dL — AB (ref 65–99)
Potassium: 5.1 mmol/L (ref 3.5–5.1)
SODIUM: 138 mmol/L (ref 135–145)
Total Bilirubin: 0.8 mg/dL (ref 0.3–1.2)
Total Protein: 5.9 g/dL — ABNORMAL LOW (ref 6.5–8.1)

## 2015-04-09 LAB — CBC
HCT: 32.9 % — ABNORMAL LOW (ref 35.0–47.0)
Hemoglobin: 10.7 g/dL — ABNORMAL LOW (ref 12.0–16.0)
MCH: 28.3 pg (ref 26.0–34.0)
MCHC: 32.5 g/dL (ref 32.0–36.0)
MCV: 87.1 fL (ref 80.0–100.0)
PLATELETS: 80 10*3/uL — AB (ref 150–440)
RBC: 3.77 MIL/uL — AB (ref 3.80–5.20)
RDW: 14.7 % — ABNORMAL HIGH (ref 11.5–14.5)
WBC: 5.7 10*3/uL (ref 3.6–11.0)

## 2015-04-09 LAB — GLUCOSE, CAPILLARY
GLUCOSE-CAPILLARY: 237 mg/dL — AB (ref 65–99)
Glucose-Capillary: 195 mg/dL — ABNORMAL HIGH (ref 65–99)
Glucose-Capillary: 222 mg/dL — ABNORMAL HIGH (ref 65–99)

## 2015-04-09 LAB — SURGICAL PATHOLOGY

## 2015-04-09 MED ORDER — OXYCODONE HCL 15 MG PO TABS: 15.0000 mg | ORAL_TABLET | Freq: Four times a day (QID) | ORAL | Status: DC | PRN

## 2015-04-09 MED ORDER — OXYCODONE-ACETAMINOPHEN 10-325 MG PO TABS: 1.0000 | ORAL_TABLET | ORAL | Status: DC | PRN

## 2015-04-09 NOTE — Progress Notes (Signed)
1 Day Post-Op  Subjective: Status post laparoscopic cholecystectomy for colic. Moderate pain this morning no nausea or vomiting. As not yet he Objective: Vital signs in last 24 hours: Temp:  [97.2 F (36.2 C)-98 F (36.7 C)] 97.5 F (36.4 C) (08/26 0744) Pulse Rate:  [65-99] 76 (08/26 0744) Resp:  [12-18] 16 (08/26 0744) BP: (92-161)/(63-97) 92/63 mmHg (08/26 0744) SpO2:  [81 %-100 %] 95 % (08/26 0744)    Intake/Output from previous day: 08/25 0701 - 08/26 0700 In: 3690.1 [P.O.:480; I.V.:3210.1] Out: 225 [Urine:200; Blood:25] Intake/Output this shift:    Physical exam:  Minimal tenderness wounds dressed no drainage.  Lab Results: CBC  No results for input(s): WBC, HGB, HCT, PLT in the last 72 hours. BMET  Recent Labs  04/07/15 1446  NA 140  K 3.9  CL 104  CO2 29  GLUCOSE 243*  BUN 11  CREATININE 0.86  CALCIUM 9.5   PT/INR No results for input(s): LABPROT, INR in the last 72 hours. ABG No results for input(s): PHART, HCO3 in the last 72 hours.  Invalid input(s): PCO2, PO2  Studies/Results: No results found.  Anti-infectives: Anti-infectives    Start     Dose/Rate Route Frequency Ordered Stop   04/08/15 0830  Ampicillin-Sulbactam (UNASYN) 3 g in sodium chloride 0.9 % 100 mL IVPB     3 g 100 mL/hr over 60 Minutes Intravenous  Once 04/08/15 0759 04/08/15 0955      Assessment/Plan: s/p Procedure(s): LAPAROSCOPIC CHOLECYSTECTOMY WITH INTRAOPERATIVE CHOLANGIOGRAM   Patient with moderate pain this morning but a benign abdominal exam. Recommend reexamination later today and probable discharge.  Florene Glen, MD, FACS  04/09/2015

## 2015-04-09 NOTE — Progress Notes (Signed)
Patient feels much better and wants to go home no nausea vomiting abdomen is soft and nontender. Dressed.  Agree with discharge follow-up as previously noted.

## 2015-04-09 NOTE — Progress Notes (Signed)
Patient A&O, VSS.  Medicated x1 for pain with good relief.   Tolerating diet well with no nausea.  Up to BR to void.  Discharge instructions reviewed with patient.  Understanding was verbalized and all questions were answered.  Patient d/c'd home in stable condition escorted by nursing staff.

## 2015-04-09 NOTE — Discharge Instructions (Signed)
Remove dressing in 24 hours. May shower in 24 hours. Leave paper strips in place. Resume all home medications. Follow-up with Dr. Burt Knack in 10 days.  Laparoscopic Cholecystectomy, Care After Refer to this sheet in the next few weeks. These instructions provide you with information on caring for yourself after your procedure. Your health care provider may also give you more specific instructions. Your treatment has been planned according to current medical practices, but problems sometimes occur. Call your health care provider if you have any problems or questions after your procedure. WHAT TO EXPECT AFTER THE PROCEDURE After your procedure, it is typical to have the following:  Pain at your incision sites. You will be given pain medicines to control the pain.  Mild nausea or vomiting. This should improve after the first 24 hours.  Bloating and possibly shoulder pain from the gas used during the procedure. This will improve after the first 24 hours. HOME CARE INSTRUCTIONS   Change bandages (dressings) as directed by your health care provider.  Keep the wound dry and clean. You may wash the wound gently with soap and water. Gently blot or dab the area dry.  Do not take baths or use swimming pools or hot tubs for 2 weeks or until your health care provider approves.  Only take over-the-counter or prescription medicines as directed by your health care provider.  Continue your normal diet as directed by your health care provider.  Do not lift anything heavier than 10 pounds (4.5 kg) until your health care provider approves.  Do not play contact sports for 1 week or until your health care provider approves. SEEK MEDICAL CARE IF:   You have redness, swelling, or increasing pain in the wound.  You notice yellowish-white fluid (pus) coming from the wound.  You have drainage from the wound that lasts longer than 1 day.  You notice a bad smell coming from the wound or dressing.  Your  surgical cuts (incisions) break open. SEEK IMMEDIATE MEDICAL CARE IF:   You develop a rash.  You have difficulty breathing.  You have chest pain.  You have a fever.  You have increasing pain in the shoulders (shoulder strap areas).  You have dizzy episodes or faint while standing.  You have severe abdominal pain.  You feel sick to your stomach (nauseous) or throw up (vomit) and this lasts for more than 1 day. Document Released: 07/31/2005 Document Revised: 05/21/2013 Document Reviewed: 03/12/2013 [facility] Patient Information 2015 [place], [place]. This information is not intended to replace advice given to you by your health care provider. Make sure you discuss any questions you have with your health care provider.

## 2015-04-09 NOTE — Discharge Summary (Signed)
Physician Discharge Summary  Patient ID: Marissa [name] MRN: [id number] DOB/AGE: 10-22-1975 39 y.o.  Admit date: 04/08/2015 Discharge date: 04/09/2015   Discharge Diagnoses:  Active Problems:   Biliary colic   Procedures: Laparoscopic cholecystectomy  Hospital Course: This is a patient admitted from Dr. Algernon Huxley office with a diagnosis of biliary colic and known gallstones she was taken the operating room where laparoscopic cholecystectomy was performed she made an uncomplicated postoperative recovery is discharged in stable condition to follow-up in our office in 10 days he is to shower tomorrow after removing her dressings.  Consults: None  Disposition: 01-Home or Self Care  Discharge Instructions    Discharge patient    Complete by:  As directed   Home            Medication List    STOP taking these medications        oxyCODONE 15 MG immediate release tablet  Commonly known as:  ROXICODONE      TAKE these medications        amitriptyline 100 MG tablet  Commonly known as:  ELAVIL  Take 100 mg by mouth at bedtime.     carvedilol 25 MG tablet  Commonly known as:  COREG  Take 1 tablet (25 mg total) by mouth 2 (two) times daily with a meal.     gabapentin 300 MG capsule  Commonly known as:  NEURONTIN  Take 600 mg by mouth 3 (three) times daily.     insulin aspart 100 UNIT/ML FlexPen  Commonly known as:  NOVOLOG FLEXPEN  Inject 10-20 Units into the skin 3 (three) times daily with meals. Sliding scale     insulin glargine 100 UNIT/ML injection  Commonly known as:  LANTUS  Inject 16 Units into the skin at bedtime.     lisinopril 10 MG tablet  Commonly known as:  PRINIVIL,ZESTRIL  Take 10 mg by mouth at bedtime.     nitroGLYCERIN 0.4 MG SL tablet  Commonly known as:  NITROSTAT  Place 0.4 mg under the tongue every 5 (five) minutes as needed for chest pain.     oxyCODONE-acetaminophen 10-325 MG per tablet  Commonly known as:  PERCOCET  Take 1 tablet by mouth  every 4 (four) hours as needed for pain.     rosuvastatin 40 MG tablet  Commonly known as:  CRESTOR  Take 40 mg by mouth at bedtime.     spironolactone 25 MG tablet  Commonly known as:  ALDACTONE  Take 25 mg by mouth daily. 1/2 tablet     tiZANidine 4 MG tablet  Commonly known as:  ZANAFLEX  Limit 1 tab po bid - tid if tolerated     topiramate 100 MG tablet  Commonly known as:  TOPAMAX  Take 100 mg by mouth at bedtime. Take with 25 mg tablet for a 125 mg dose     VICTOZA 18 MG/3ML Sopn  Generic drug:  Liraglutide  Inject 1.8 mg into the skin daily.           Follow-up Information    Follow up with Phoebe Perch, MD In 10 days.   Specialty:  Surgery   Why:  For wound re-check   Contact information:   [place] [id number] [phone]       Florene Glen, MD, FACS

## 2015-04-12 ENCOUNTER — Telehealth: Payer: Self-pay | Admitting: Surgery

## 2015-04-12 ENCOUNTER — Ambulatory Visit
Admission: RE | Admit: 2015-04-12 | Discharge: 2015-04-12 | Disposition: A | Discharge status: Home / Self Care | Payer: PPO | Source: Ambulatory Visit | Attending: Surgery | Admitting: Surgery

## 2015-04-12 ENCOUNTER — Telehealth: Payer: Self-pay

## 2015-04-12 DIAGNOSIS — M79662 Pain in left lower leg: Secondary | ICD-10-CM | POA: Diagnosis not present

## 2015-04-12 NOTE — Telephone Encounter (Signed)
Patient called and made her Post Op appt for 9/12 and she mentioned breathng problems and her leg swelling.please call patient

## 2015-04-12 NOTE — Telephone Encounter (Signed)
Spoke with Dr. Pat Patrick. Verbal order for STAT - Ultrasound LLE given at this time. Order placed in EPIC.  Called patient to explain instructions. She is calling her sister who will be her transportation to see how soon she can be to hospital.  Pt called back in and can be at [facility] in 20-30 minutes.  Call made to Central scheduling. Patient needs to arrive in [place] at 3:30pm.  Call made back to patient. Information given. Verbalized and read back appointment information.

## 2015-04-12 NOTE — Telephone Encounter (Signed)
Returned phone call to patient at this time. She states that she has a sensation of a cramp in her left calf since yesterday morning that will not let up. Denies heat coming from this area or any redness/ skin changes that she notices. But, states that she is having a difficult time taking a deep breath as well. Breathing difficulty is only when trying to take a deep breath. Denies cyanosis, lethargy, or chest pain at this time.  Will speak with Dr. Pat Patrick for advise regarding this patient and then return phone call to patient with instructions.

## 2015-04-12 NOTE — Telephone Encounter (Signed)
Ultrasound tech called and states that preliminary results are negative for DVT on this patient. Dr. Pat Patrick notified.

## 2015-04-13 ENCOUNTER — Ambulatory Visit (INDEPENDENT_AMBULATORY_CARE_PROVIDER_SITE_OTHER): Payer: PPO | Admitting: Surgery

## 2015-04-13 ENCOUNTER — Telehealth: Payer: Self-pay | Admitting: Surgery

## 2015-04-13 ENCOUNTER — Encounter: Payer: Self-pay | Admitting: Surgery

## 2015-04-13 VITALS — BP 165/100 | HR 95 | Temp 98.2°F

## 2015-04-13 DIAGNOSIS — R600 Localized edema: Secondary | ICD-10-CM

## 2015-04-13 MED ORDER — POTASSIUM CHLORIDE ER 10 MEQ PO TBCR: 20.0000 meq | EXTENDED_RELEASE_TABLET | Freq: Two times a day (BID) | ORAL | Status: DC

## 2015-04-13 MED ORDER — FUROSEMIDE 20 MG PO TABS: 20.0000 mg | ORAL_TABLET | Freq: Two times a day (BID) | ORAL | Status: DC

## 2015-04-13 NOTE — Telephone Encounter (Signed)
Spoke with patient at this time. Explained that [place] was negative and she states now she is having BLE swelling. She says that it feels like bands are around both legs and they are extremely tight.  She continues to have difficulty taking a deep breath. Denies chest pain. Patient states that she wants someone to look at her legs today.  Patient placed on schedule today for 4:30pm to see Dr. Pat Patrick.

## 2015-04-13 NOTE — Patient Instructions (Signed)
Please pick up your prescriptions for Lasix 20 mg and Potassium at your pharmacy.  You have a return appointment on 04/26/15 at 0900 with Dr Burt Knack in the [facility] office. If your condition does not improve call our office immediately of seek treatment at the emergency department.

## 2015-04-13 NOTE — Telephone Encounter (Signed)
Marissa [name], patient spoke with you yesterday, Wants results from [place] yesterday and what she can do for both her legs swelling, please call and advise.

## 2015-04-13 NOTE — Progress Notes (Signed)
Outpatient Surgical Follow Up  04/13/2015  Marissa [name] is an 39 y.o. female.   Chief Complaint  Patient presents with  . Post-op Problem    BLE swelling    HPI: She returns following her cholecystectomy last week complaining of increasing weight gain and lower extremity edema. She called the office yesterday complaining of some mild shortness of breath and some lower extremity edema. She had an urgent ultrasound which did not demonstrate any evidence of deep venous thrombosis. She comes in today complaining that she is still symptomatic and feels as though she's gain almost 20 pounds since surgery. She has no nausea or vomiting. She's having trouble wearing the normal size clothing. She feels as though she has total body fluid gain.  She denies any shortness of breath or chest pain. She does have a history of congestive heart failure and is followed regularly by cardiologist. She is currently not on any diuretics other than spironolactone.  Past Medical History  Diagnosis Date  . Hypertension   . Coronary artery disease   . Neuropathy   . Headache(784.0)   . Arthritis   . Vision loss     due to diabetes  . Myocardial infarction 2006    "due to medication"; no evidence of ischemia or infarction by nuclear stress test '11  . Dysrhythmia     tachycardia  . Diabetes mellitus without complication     fasting cbg 50-140s  . GERD (gastroesophageal reflux disease)     otc meds  . Migraines     once/month maybe - can last up to two weeks  . Hyperlipemia   . Cardiomyopathy, dilated, nonischemic   . MI, old     2006  . Neck pain, chronic   . Back pain, chronic   . Depression   . Anxiety   . Hypercholesteremia   . Allergy   . Anemia   . Prolonged QT interval syndrome   . Cholelithiasis   . Pneumonia 2015    [facility]  . Sleep apnea     does not use cpap since losing alot of weight    Past Surgical History  Procedure Laterality Date  . Back surgery  2011    Lumbar   .  Cervical fusion  2006  . Salpingoophorectomy Bilateral     1 1997. 2nd 2001  . Cardiac catheterization  07/18/2004    50-60% mid LAD, minor luminal irregularities RCA, normal LM and CX, EF 50-55% [facility])  . Lumbar laminectomy/decompression microdiscectomy Right 05/07/2013    Procedure: Right Lumbar one-two laminectomy;  Surgeon: Elaina Hoops, MD;  Location: [place] NEURO ORS;  Service: Neurosurgery;  Laterality: Right;  . Carpel tunnel Bilateral   . Lumbar laminectomy/decompression microdiscectomy Left 10/29/2013    Procedure: Left Lumbar five-Sacral one Laminectomy;  Surgeon: Elaina Hoops, MD;  Location: [place] NEURO ORS;  Service: Neurosurgery;  Laterality: Left;  . Lumbar wound debridement N/A 01/25/2014    Procedure: LUMBAR WOUND DEBRIDEMENT;  Surgeon: Charlie Pitter, MD;  Location: [place] NEURO ORS;  Service: Neurosurgery;  Laterality: N/A;  . Lumbar wound debridement N/A 02/25/2014    Procedure: LUMBAR WOUND DEBRIDEMENT;  Surgeon: Elaina Hoops, MD;  Location: [place] NEURO ORS;  Service: Neurosurgery;  Laterality: N/A;  . Cholecystectomy N/A 04/08/2015    Procedure: LAPAROSCOPIC CHOLECYSTECTOMY WITH INTRAOPERATIVE CHOLANGIOGRAM;  Surgeon: Florene Glen, MD;  Location: [facility] ORS;  Service: General;  Laterality: N/A;    Family History  Problem Relation Age of  Onset  . Depression Mother   . Drug abuse Mother   . Early death Mother   . Hypertension Mother   . Varicose Veins Mother   . Diabetes Father   . Early death Father   . Hyperlipidemia Father     Social History:  reports that she has never smoked. She has never used smokeless tobacco. She reports that she does not drink alcohol or use illicit drugs.  Allergies:  Allergies  Allergen Reactions  . Bactrim [Sulfamethoxazole-Trimethoprim] Nausea And Vomiting  . Ciprofloxacin Swelling  . Rocephin [Ceftriaxone Sodium In Dextrose] Nausea And Vomiting  . Vancomycin Nausea And Vomiting  . Azithromycin Itching and Rash  . Food Itching  and Rash    "Greek yogurt"    Medications reviewed.    ROS    BP 165/100 mmHg  Pulse 95  Temp(Src) 98.2 F (36.8 C) (Oral)  LMP 12/21/2014 (Approximate)  Physical Exam her chest is clear. There is no sign of any pulmonary edema. She does not have any tubular breath sounds. Heart sounds are unremarkable. Her legs show some mild lower extremity edema. Catheter tight bilaterally. She has no significant ankle edema. She has a negative Homans sign.     No results found for this or any previous visit (from the past 48 hour(s)). US Venous Img Lower Unilateral Left  04/12/2015   CLINICAL DATA:  Left calf pain x1 day.  Recent CHOLECYSTECTOMY.  EXAM: LEFT LOWER EXTREMITY VENOUS DOPPLER ULTRASOUND  TECHNIQUE: Gray-scale sonography with compression, as well as color and duplex ultrasound, were performed to evaluate the deep venous system from the level of the common femoral vein through the popliteal and proximal calf veins.  COMPARISON:  03/03/2014  FINDINGS: Normal compressibility of the common femoral, superficial femoral, and popliteal veins, as well as the proximal calf veins. No filling defects to suggest DVT on grayscale or color Doppler imaging. Doppler waveforms show normal direction of venous flow, normal respiratory phasicity and response to augmentation. Survey views of the contralateral common femoral vein are unremarkable.  IMPRESSION: 1. No evidence of lower extremity deep vein thrombosis, LEFT.   Electronically Signed   By: Lucrezia Europe M.D.   On: 04/12/2015 16:52    Assessment/Plan:  1. Bilateral leg edema I really can't believe 20 pound fluid gain is related to her surgery or she could develop that sort of problem in 5 days since her operation. She certainly didn't receive that much fluid during surgery. Her ultrasound was unremarkable aside all think her problem is deep venous thrombosis. She does not have any pulmonary symptoms currently. We will put her on some Lasix and  potassium will hold her spironolactone. If she has noted some improvement in the next 24 hours we will arrange for her to see her cardiologist. At the present time I cannot relate her symptoms to her surgery less she had some exacerbation of her cardiac disease during the surgery. She's not had any chest pain and no shortness of breath consistent with cardiac problems. He is in agreement with this plan. - furosemide (LASIX) 20 MG tablet; Take 1 tablet (20 mg total) by mouth 2 (two) times daily.  Dispense: 30 tablet; Refill: 0 - potassium chloride (K-DUR) 10 MEQ tablet; Take 2 tablets (20 mEq total) by mouth 2 (two) times daily.  Dispense: 20 tablet; Refill: Baldwin III  04/13/2015,negative

## 2015-04-16 ENCOUNTER — Telehealth: Payer: Self-pay

## 2015-04-16 NOTE — Telephone Encounter (Signed)
-----   Message from Reece Packer, RN sent at 04/14/2015  9:06 AM EDT ----- Call patient for update on condition after starting lasix. If improved, set up appt with Cardiologist. If not, follow-up in office when scheduled on 9/12

## 2015-04-16 NOTE — Telephone Encounter (Signed)
Called patient to check on status since beginning Lasix. She is doing much better she states. Explained that we need to get her in with your cardiologist and she has a scheduled appointment on 9/20 with Dr. Marcelline Deist.  Encouraged her to keep this appointment and also to keep appointment with Dr. Burt Knack.  She verbalizes understanding.

## 2015-04-20 ENCOUNTER — Ambulatory Visit: Payer: PPO | Attending: Pain Medicine | Admitting: Pain Medicine

## 2015-04-20 ENCOUNTER — Encounter: Payer: Self-pay | Admitting: Pain Medicine

## 2015-04-20 VITALS — BP 178/106 | HR 102 | Temp 98.0°F | Resp 16 | Ht 64.0 in | Wt 207.0 lb

## 2015-04-20 DIAGNOSIS — M5137 Other intervertebral disc degeneration, lumbosacral region: Secondary | ICD-10-CM

## 2015-04-20 DIAGNOSIS — M4806 Spinal stenosis, lumbar region: Secondary | ICD-10-CM | POA: Diagnosis not present

## 2015-04-20 DIAGNOSIS — M5136 Other intervertebral disc degeneration, lumbar region: Secondary | ICD-10-CM | POA: Diagnosis not present

## 2015-04-20 DIAGNOSIS — E1340 Other specified diabetes mellitus with diabetic neuropathy, unspecified: Secondary | ICD-10-CM

## 2015-04-20 DIAGNOSIS — M533 Sacrococcygeal disorders, not elsewhere classified: Secondary | ICD-10-CM | POA: Diagnosis not present

## 2015-04-20 DIAGNOSIS — Z9889 Other specified postprocedural states: Secondary | ICD-10-CM | POA: Insufficient documentation

## 2015-04-20 DIAGNOSIS — M5481 Occipital neuralgia: Secondary | ICD-10-CM

## 2015-04-20 DIAGNOSIS — M542 Cervicalgia: Secondary | ICD-10-CM | POA: Diagnosis present

## 2015-04-20 DIAGNOSIS — M5030 Other cervical disc degeneration, unspecified cervical region: Secondary | ICD-10-CM | POA: Diagnosis not present

## 2015-04-20 DIAGNOSIS — M5416 Radiculopathy, lumbar region: Secondary | ICD-10-CM | POA: Insufficient documentation

## 2015-04-20 DIAGNOSIS — M79605 Pain in left leg: Secondary | ICD-10-CM | POA: Diagnosis present

## 2015-04-20 DIAGNOSIS — M79604 Pain in right leg: Secondary | ICD-10-CM | POA: Diagnosis present

## 2015-04-20 DIAGNOSIS — M5134 Other intervertebral disc degeneration, thoracic region: Secondary | ICD-10-CM

## 2015-04-20 MED ORDER — OXYCODONE HCL 15 MG PO TABS: ORAL_TABLET | ORAL | Status: DC

## 2015-04-20 MED ORDER — TIZANIDINE HCL 4 MG PO TABS: ORAL_TABLET | ORAL | Status: DC

## 2015-04-20 NOTE — Progress Notes (Signed)
   Subjective:    Patient ID: Marissa [name], female    DOB: 1976-02-24, 39 y.o.   MRN: [id number]  HPI  Patient is 39 year old female returns to pain management for further evaluation and treatment of pain involving the neck and entire back upper and lower extremity regions as well as headaches. Patient is without significant change in her condition at this time is without plans with additional surgical intervention of the lumbar and cervical regions. We advised patient follow-up with Dr.Cram for evaluation of her neurosurgical condition and patient will follow-up with Dr. Richarda Overlie and cardiologist as discussed. We will continue medications as prescribed at this time. The patient was in agreement with suggested treatment plan.      Review of Systems     Objective:   Physical Exam  There was tenderness of the splenius capitis and occipitalis region of mild to moderate degree. There was well-healed scar the cervical region without increased warmth or erythema in the region of the scar. Patient was with decreased grip strength. Tinel and Phalen's maneuver without increased pain of significant degree. There appeared to be unremarkable Spurling's maneuver. There was tenderness over the cervical facet cervical paraspinal musculature region of moderate degree as well as the thoracic facet and thoracic paraspinal muscles region a moderate degree. Palpation over the region of the lumbar paraspinal muscles region lumbar facet region was with moderate and palpation with significant limited straight leg raising to approximately 10 with decreased EHL strength and questions he decreased sensation along the L5 dermatomal distribution. DTRs difficult to elicit. There was moderate tends to the greater trochanteric region iliotibial band region. There was tenderness of the PSIS and PII S region of mild to moderate degree. There appeared to be negative clonus negative Homans. Abdomen was nontender and no  costovertebral angle tenderness was noted.      Assessment & Plan:   Degenerative disc disease lumbar spine L5-S1 hardware in good position without complicating features. Extensive postsurgical changes in the posterior soft tissues without obvious abscess. Moderate spurring changes L1 to contributing to right lateral recess and right foraminal stenosis. Bilateral lateral recess stenosis L2-3. Lateral spurring L3-4 with possible impingement of the extraforaminal L3 nerve root. Spinal stenosis and lateral recess stenosis at L4-5. Shallow extraforaminal disc protrusion on the right which could affect the right L4 nerve root.  Lumbar facet syndrome  Lumbar radiculopathy  Sacroiliac joint dysfunction  Cervical degenerative disc disease Status post surgical intervention cervical regionCervical facet syndrome     PLAN   Continue present medication Zanaflex and oxycodone  F/U PCP Dr.Lithavong and cardiologist  for evaliation of  BP and general medical  conditionArea we discussed patient's condition on today's visit and patient will follow-up with Dr. Richarda Overlie and /or her cardiologist today or by [facility] Wednesday, 05/01/2015 for further evaluation of her condition  F/U surgical evaluation.. Patient will follow-up with Dr.Cram as discussed   F/U neurological evaluation. May consider pending follow-up evaluations  May consider radiofrequency rhizolysis or intraspinal procedures pending response to present treatment and F/U evaluation   Patient to call [facility] should patient have concerns prior to scheduled return appointment.

## 2015-04-20 NOTE — Patient Instructions (Addendum)
PLAN   Continue present medication Zanaflex and oxycodone  F/U PCP Dr.Lithavong  for evaliation of  BP and general medical condition. Please follow-up with Dr. Richarda Overlie today for evaluation of diabetes and general medical condition as discussed  F/U surgical evaluation. May consider pending follow-up evaluations  F/U neurological evaluation. May consider pending follow-up evaluations  May consider radiofrequency rhizolysis or intraspinal procedures pending response to present treatment and F/U evaluation   Patient to call [facility] should patient have concerns prior to scheduled return appointment.  patient instructed to call or see surgeon if nausea and vomitting persist today.

## 2015-04-22 ENCOUNTER — Telehealth: Payer: Self-pay | Admitting: Surgery

## 2015-04-22 NOTE — Telephone Encounter (Signed)
Patient had Gallbladder surgery on 8/25 and still feeling some discomfort and just wanted to make sure it was normal and was curious how long this could last

## 2015-04-22 NOTE — Telephone Encounter (Signed)
Returned patient call. Patient reports pain that occurs occasionally when she moves a certain way. No symptoms of fever, redness or drainage at incision sites, or constant severe pain. Informed patient that some positional pain is normal and should resolve in a few weeks. Patient confirmed understanding of information.

## 2015-04-23 ENCOUNTER — Other Ambulatory Visit: Payer: Self-pay | Admitting: *Deleted

## 2015-04-23 ENCOUNTER — Encounter: Payer: Self-pay | Admitting: *Deleted

## 2015-04-23 DIAGNOSIS — Z792 Long term (current) use of antibiotics: Secondary | ICD-10-CM | POA: Insufficient documentation

## 2015-04-23 DIAGNOSIS — I429 Cardiomyopathy, unspecified: Secondary | ICD-10-CM | POA: Insufficient documentation

## 2015-04-26 ENCOUNTER — Encounter: Payer: Self-pay | Admitting: Surgery

## 2015-04-26 ENCOUNTER — Ambulatory Visit (INDEPENDENT_AMBULATORY_CARE_PROVIDER_SITE_OTHER): Payer: PPO | Admitting: Surgery

## 2015-04-26 VITALS — BP 149/86 | HR 98 | Temp 98.3°F | Wt 210.0 lb

## 2015-04-26 DIAGNOSIS — K8000 Calculus of gallbladder with acute cholecystitis without obstruction: Secondary | ICD-10-CM

## 2015-04-26 NOTE — Patient Instructions (Signed)
Follow up in office as needed 

## 2015-04-26 NOTE — Progress Notes (Signed)
Outpatient postop visit  04/26/2015  Marissa [name] is an 39 y.o. female.    Procedure:  Laparoscopic cholecystectomy  CC: minimal pain  HPI:  Patient laparoscopically cholecystectomy performed she saw Dr. Pat Patrick for acute leg swelling with a negative ultrasound for DVT.  she has lost all her weight with diaphoretic's and is feeling much better now area no shortness of breath. She is eating well without problems with bowel movement and only occasional epigastric incisional pain. Medications reviewed.    Physical Exam:  BP 149/86 mmHg  Pulse 98  Temp(Src) 98.3 F (36.8 C) (Oral)  Wt 210 lb (95.255 kg)  LMP 12/21/2014 (Approximate)    PE:  No peripheral edema abdomen is soft and nontender wounds are healing well without erythema    Assessment/Plan:   patient doing very well at this point recommend follow up on an as-needed basis reminded of no heavy lifting for 2 weeks.  Florene Glen, MD, FACS

## 2015-05-20 ENCOUNTER — Encounter: Payer: Self-pay | Admitting: Pain Medicine

## 2015-05-20 ENCOUNTER — Ambulatory Visit: Payer: PPO | Attending: Pain Medicine | Admitting: Pain Medicine

## 2015-05-20 DIAGNOSIS — E1340 Other specified diabetes mellitus with diabetic neuropathy, unspecified: Secondary | ICD-10-CM

## 2015-05-20 DIAGNOSIS — M5126 Other intervertebral disc displacement, lumbar region: Secondary | ICD-10-CM

## 2015-05-20 DIAGNOSIS — E1140 Type 2 diabetes mellitus with diabetic neuropathy, unspecified: Secondary | ICD-10-CM | POA: Diagnosis not present

## 2015-05-20 DIAGNOSIS — M5137 Other intervertebral disc degeneration, lumbosacral region: Secondary | ICD-10-CM

## 2015-05-20 DIAGNOSIS — M542 Cervicalgia: Secondary | ICD-10-CM | POA: Diagnosis present

## 2015-05-20 DIAGNOSIS — T847XXD Infection and inflammatory reaction due to other internal orthopedic prosthetic devices, implants and grafts, subsequent encounter: Secondary | ICD-10-CM

## 2015-05-20 DIAGNOSIS — M5136 Other intervertebral disc degeneration, lumbar region: Secondary | ICD-10-CM | POA: Diagnosis not present

## 2015-05-20 DIAGNOSIS — M5416 Radiculopathy, lumbar region: Secondary | ICD-10-CM | POA: Diagnosis not present

## 2015-05-20 DIAGNOSIS — M51379 Other intervertebral disc degeneration, lumbosacral region without mention of lumbar back pain or lower extremity pain: Secondary | ICD-10-CM

## 2015-05-20 DIAGNOSIS — M5030 Other cervical disc degeneration, unspecified cervical region: Secondary | ICD-10-CM | POA: Diagnosis not present

## 2015-05-20 DIAGNOSIS — Z9689 Presence of other specified functional implants: Secondary | ICD-10-CM | POA: Diagnosis not present

## 2015-05-20 DIAGNOSIS — M79602 Pain in left arm: Secondary | ICD-10-CM | POA: Diagnosis present

## 2015-05-20 DIAGNOSIS — M79601 Pain in right arm: Secondary | ICD-10-CM | POA: Diagnosis present

## 2015-05-20 DIAGNOSIS — M533 Sacrococcygeal disorders, not elsewhere classified: Secondary | ICD-10-CM | POA: Diagnosis not present

## 2015-05-20 DIAGNOSIS — Z9889 Other specified postprocedural states: Secondary | ICD-10-CM | POA: Insufficient documentation

## 2015-05-20 DIAGNOSIS — G43109 Migraine with aura, not intractable, without status migrainosus: Secondary | ICD-10-CM

## 2015-05-20 DIAGNOSIS — M4806 Spinal stenosis, lumbar region: Secondary | ICD-10-CM | POA: Diagnosis not present

## 2015-05-20 DIAGNOSIS — M5481 Occipital neuralgia: Secondary | ICD-10-CM

## 2015-05-20 DIAGNOSIS — M5134 Other intervertebral disc degeneration, thoracic region: Secondary | ICD-10-CM

## 2015-05-20 MED ORDER — OXYCODONE HCL 15 MG PO TABS: ORAL_TABLET | ORAL | Status: DC

## 2015-05-20 MED ORDER — TIZANIDINE HCL 4 MG PO TABS: ORAL_TABLET | ORAL | Status: DC

## 2015-05-20 NOTE — Progress Notes (Signed)
   Subjective:    Patient ID: Marissa [name], female    DOB: [birth year], 39 y.o.   MRN: [id number]  HPI  Patient data 62-year-old female [facility] for further evaluation and treatment of pain involving the region of the neck upper extremity region. Lower back and lower extremity regions. Patient is a significant pain of the lower back and lower extremity region. We discussed treatment of patient and prefers to avoid interventional treatment of patient at this time due to patient's general medical condition. The patient is status post surgery of both the cervical and lumbar regions at the present time is without transfer additional surgical intervention. We will continue medications and nonconventional treatment measures at this time and will consider modification of treatment pending follow-up evaluations. The patient was understanding and in agreement with suggested treatment plan.     Review of Systems     Objective:   Physical Exam  There was tends to palpation paraspinal musculature region cervical region cervical facet region with well-healed surgical scar of the cervical region without increased warmth or erythema in the region scar. There was tenderness over the splenius capitis and occipitalis region of moderate degree. There was unremarkable Spurling's maneuver. Patient was with decreased grip strength. Tinel and Phalen's maneuver were without exacerbation of significant pain. Palpation over the region of the thoracic facet thoracic paraspinal musculature region was with moderate tenderness to palpation of moderate muscle spasms of the thoracic region noted. Palpation over the lumbar paraspinal muscles lumbar facet region associated with moderately severe discomfort. Lateral bending and rotation and extension and palpation of the lumbar facets reproduce moderately severe discomfort. There was mild to moderate tends of the greater trochanteric region iliotibial band  region. Palpation over the PSIS and PII S regions reproduced moderately severe discomfort. Straight leg raising limited to approximately 20 with questionable increased pain with dorsiflexion noted. EHL strength was decreased. There appeared to be decreased sensation of the L5 dermatomal distribution. There was negative clonus negative Homans. DTRs were difficult to elicit patient had difficulty relaxing. Abdomen was nontender with no costovertebral angle tenderness noted.      Assessment & Plan:    Degenerative disc disease lumbar spine L5-S1 hardware in good position without complicating features. Extensive postsurgical changes in the posterior soft tissues without obvious abscess. Moderate spurring changes L1 to contributing to right lateral recess and right foraminal stenosis. Bilateral lateral recess stenosis L2-3. Lateral spurring L3-4 with possible impingement of the extraforaminal L3 nerve root. Spinal stenosis and lateral recess stenosis at L4-5. Shallow extraforaminal disc protrusion on the right which could affect the right L4 nerve root.  Lumbar stenosis  Lumbar facet syndrome  Lumbar radiculopathy  Sacroiliac joint dysfunction  Diabetes mellitus with diabetic neuropathy  Cervical degenerative disc disease Status post surgical intervention cervical regionCervical facet syndrome     PLAN   Continue present medication  F/U PCP Dr.Lithavong for evaliation of  BPdiabetes mellitus  and general medical  condition  F/U surgical evaluation. Patient will undergo follow-up surgical evaluation with Dr. [name] as discussed  F/U cardiac evaluation as discussed  F/U neurological evaluation. May consider pending follow-up evaluations  May consider radiofrequency rhizolysis or intraspinal procedures pending response to present treatment and F/U evaluation  area at the present time we prefer to avoid considering any interventional treatment for this patient due to patient's general  medical condition   Patient to call [facility] should patient have concerns prior to scheduled return appointment.

## 2015-05-20 NOTE — Patient Instructions (Addendum)
PLAN   Continue present medication Zanaflex and oxycodone  F/U PCP Dr. Richarda Overlie for evaliation of  BP and general medical  condition  F/U surgical evaluation as discussed  F/U neurological evaluation. May consider pending follow-up evaluations  Patient to call [facility] should patient have concerns prior to scheduled return appointment. Tizanidine was e-scribed to your pharmacy

## 2015-06-04 ENCOUNTER — Telehealth: Payer: Self-pay | Admitting: Surgery

## 2015-06-04 NOTE — Telephone Encounter (Signed)
Returned patient call. Patient reports RLQ that started two weeks ago and reports no pain in her RUQ. Informed patient that the pain is probably not due to her cholecystectomy and that she should visit her PCP to determine the cause of her new pain. Patient confirmed understanding of information.

## 2015-06-04 NOTE — Telephone Encounter (Signed)
Patient called and said she is having pain below the [place] site, Not all the time it comes and goes, per patient it has been going on for weeks she had a [place] CHOLANGIOGRAM 04/08/2015 with Dr.Cooper

## 2015-06-07 ENCOUNTER — Ambulatory Visit
Admission: RE | Admit: 2015-06-07 | Discharge: 2015-06-07 | Disposition: A | Discharge status: Home / Self Care | Payer: PPO | Source: Ambulatory Visit | Attending: Family Medicine | Admitting: Family Medicine

## 2015-06-07 ENCOUNTER — Other Ambulatory Visit: Payer: Self-pay | Admitting: Family Medicine

## 2015-06-07 DIAGNOSIS — Z9049 Acquired absence of other specified parts of digestive tract: Secondary | ICD-10-CM | POA: Insufficient documentation

## 2015-06-07 DIAGNOSIS — R1011 Right upper quadrant pain: Secondary | ICD-10-CM

## 2015-06-07 DIAGNOSIS — K7689 Other specified diseases of liver: Secondary | ICD-10-CM | POA: Insufficient documentation

## 2015-06-12 ENCOUNTER — Encounter (HOSPITAL_COMMUNITY): Payer: Self-pay | Admitting: Family Medicine

## 2015-06-12 ENCOUNTER — Emergency Department (HOSPITAL_COMMUNITY)
Admission: EM | Admit: 2015-06-12 | Discharge: 2015-06-12 | Disposition: A | Discharge status: Home / Self Care | Payer: PPO | Attending: Emergency Medicine | Admitting: Emergency Medicine

## 2015-06-12 ENCOUNTER — Emergency Department (HOSPITAL_COMMUNITY): Payer: PPO

## 2015-06-12 DIAGNOSIS — E785 Hyperlipidemia, unspecified: Secondary | ICD-10-CM | POA: Diagnosis not present

## 2015-06-12 DIAGNOSIS — K219 Gastro-esophageal reflux disease without esophagitis: Secondary | ICD-10-CM | POA: Insufficient documentation

## 2015-06-12 DIAGNOSIS — R112 Nausea with vomiting, unspecified: Secondary | ICD-10-CM | POA: Insufficient documentation

## 2015-06-12 DIAGNOSIS — F329 Major depressive disorder, single episode, unspecified: Secondary | ICD-10-CM | POA: Insufficient documentation

## 2015-06-12 DIAGNOSIS — G8929 Other chronic pain: Secondary | ICD-10-CM | POA: Insufficient documentation

## 2015-06-12 DIAGNOSIS — Z79899 Other long term (current) drug therapy: Secondary | ICD-10-CM | POA: Insufficient documentation

## 2015-06-12 DIAGNOSIS — R1013 Epigastric pain: Secondary | ICD-10-CM | POA: Insufficient documentation

## 2015-06-12 DIAGNOSIS — Z9049 Acquired absence of other specified parts of digestive tract: Secondary | ICD-10-CM | POA: Insufficient documentation

## 2015-06-12 DIAGNOSIS — I2510 Atherosclerotic heart disease of native coronary artery without angina pectoris: Secondary | ICD-10-CM | POA: Insufficient documentation

## 2015-06-12 DIAGNOSIS — Z862 Personal history of diseases of the blood and blood-forming organs and certain disorders involving the immune mechanism: Secondary | ICD-10-CM | POA: Diagnosis not present

## 2015-06-12 DIAGNOSIS — G43909 Migraine, unspecified, not intractable, without status migrainosus: Secondary | ICD-10-CM | POA: Insufficient documentation

## 2015-06-12 DIAGNOSIS — G629 Polyneuropathy, unspecified: Secondary | ICD-10-CM | POA: Insufficient documentation

## 2015-06-12 DIAGNOSIS — Z8742 Personal history of other diseases of the female genital tract: Secondary | ICD-10-CM | POA: Diagnosis not present

## 2015-06-12 DIAGNOSIS — I509 Heart failure, unspecified: Secondary | ICD-10-CM | POA: Insufficient documentation

## 2015-06-12 DIAGNOSIS — R1011 Right upper quadrant pain: Secondary | ICD-10-CM

## 2015-06-12 DIAGNOSIS — I252 Old myocardial infarction: Secondary | ICD-10-CM | POA: Diagnosis not present

## 2015-06-12 DIAGNOSIS — Z8701 Personal history of pneumonia (recurrent): Secondary | ICD-10-CM | POA: Diagnosis not present

## 2015-06-12 DIAGNOSIS — Z794 Long term (current) use of insulin: Secondary | ICD-10-CM | POA: Insufficient documentation

## 2015-06-12 DIAGNOSIS — Z90722 Acquired absence of ovaries, bilateral: Secondary | ICD-10-CM | POA: Diagnosis not present

## 2015-06-12 DIAGNOSIS — I10 Essential (primary) hypertension: Secondary | ICD-10-CM | POA: Insufficient documentation

## 2015-06-12 DIAGNOSIS — Z8739 Personal history of other diseases of the musculoskeletal system and connective tissue: Secondary | ICD-10-CM | POA: Diagnosis not present

## 2015-06-12 DIAGNOSIS — E119 Type 2 diabetes mellitus without complications: Secondary | ICD-10-CM | POA: Diagnosis not present

## 2015-06-12 DIAGNOSIS — F419 Anxiety disorder, unspecified: Secondary | ICD-10-CM | POA: Insufficient documentation

## 2015-06-12 HISTORY — DX: Disorder of kidney and ureter, unspecified: N28.9

## 2015-06-12 HISTORY — DX: Heart failure, unspecified: I50.9

## 2015-06-12 LAB — CBC
HCT: 37.6 % (ref 36.0–46.0)
HEMOGLOBIN: 12.5 g/dL (ref 12.0–15.0)
MCH: 28.7 pg (ref 26.0–34.0)
MCHC: 33.2 g/dL (ref 30.0–36.0)
MCV: 86.4 fL (ref 78.0–100.0)
Platelets: 183 10*3/uL (ref 150–400)
RBC: 4.35 MIL/uL (ref 3.87–5.11)
RDW: 13.4 % (ref 11.5–15.5)
WBC: 4.3 10*3/uL (ref 4.0–10.5)

## 2015-06-12 LAB — COMPREHENSIVE METABOLIC PANEL
ALK PHOS: 113 U/L (ref 38–126)
ALT: 27 U/L (ref 14–54)
ANION GAP: 7 (ref 5–15)
AST: 28 U/L (ref 15–41)
Albumin: 3.6 g/dL (ref 3.5–5.0)
BILIRUBIN TOTAL: 0.8 mg/dL (ref 0.3–1.2)
BUN: 8 mg/dL (ref 6–20)
CALCIUM: 9.2 mg/dL (ref 8.9–10.3)
CO2: 27 mmol/L (ref 22–32)
Chloride: 101 mmol/L (ref 101–111)
Creatinine, Ser: 0.75 mg/dL (ref 0.44–1.00)
GFR calc non Af Amer: >60 mL/min (ref >60)
Glucose, Bld: 243 mg/dL — ABNORMAL HIGH (ref 65–99)
Potassium: 4 mmol/L (ref 3.5–5.1)
SODIUM: 135 mmol/L (ref 135–145)
TOTAL PROTEIN: 6.9 g/dL (ref 6.5–8.1)

## 2015-06-12 LAB — URINALYSIS, ROUTINE W REFLEX MICROSCOPIC
BILIRUBIN URINE: NEGATIVE
Glucose, UA: >1000 mg/dL — AB
HGB URINE DIPSTICK: NEGATIVE
KETONES UR: NEGATIVE mg/dL
Leukocytes, UA: NEGATIVE
Nitrite: NEGATIVE
PH: 5.5 (ref 5.0–8.0)
Protein, ur: NEGATIVE mg/dL
SPECIFIC GRAVITY, URINE: 1.035 — AB (ref 1.005–1.030)
UROBILINOGEN UA: 0.2 mg/dL (ref 0.0–1.0)

## 2015-06-12 LAB — LIPASE, BLOOD: Lipase: 31 U/L (ref 11–51)

## 2015-06-12 LAB — URINE MICROSCOPIC-ADD ON

## 2015-06-12 MED ORDER — SODIUM CHLORIDE 0.9 % IV BOLUS (SEPSIS)
1000.0000 mL | Freq: Once | INTRAVENOUS | Status: AC
  Administered 2015-06-12: 1000 mL via INTRAVENOUS

## 2015-06-12 MED ORDER — IOHEXOL 300 MG/ML  SOLN
100.0000 mL | Freq: Once | INTRAMUSCULAR | Status: AC | PRN
  Administered 2015-06-12: 100 mL via INTRAVENOUS

## 2015-06-12 MED ORDER — OXYCODONE-ACETAMINOPHEN 5-325 MG PO TABS
1.0000 | ORAL_TABLET | Freq: Once | ORAL | Status: AC
  Administered 2015-06-12: 1 via ORAL
  Filled 2015-06-12: qty 1

## 2015-06-12 MED ORDER — HYDROMORPHONE HCL 1 MG/ML IJ SOLN
1.0000 mg | Freq: Once | INTRAMUSCULAR | Status: AC
  Administered 2015-06-12: 1 mg via INTRAVENOUS
  Filled 2015-06-12: qty 1

## 2015-06-12 MED ORDER — ONDANSETRON HCL 4 MG/2ML IJ SOLN
4.0000 mg | Freq: Once | INTRAMUSCULAR | Status: AC
  Administered 2015-06-12: 4 mg via INTRAVENOUS
  Filled 2015-06-12: qty 2

## 2015-06-12 MED ORDER — FENTANYL CITRATE (PF) 100 MCG/2ML IJ SOLN
50.0000 ug | Freq: Once | INTRAMUSCULAR | Status: AC
  Administered 2015-06-12: 50 ug via INTRAVENOUS
  Filled 2015-06-12: qty 2

## 2015-06-12 MED ORDER — OXYCODONE HCL 5 MG PO TABS: ORAL_TABLET | ORAL | Status: DC

## 2015-06-12 NOTE — ED Notes (Signed)
Pt here for RUQ pain. sts a few weeks. Denies N,V,D.

## 2015-06-12 NOTE — ED Notes (Signed)
Patient transported to CT 

## 2015-06-12 NOTE — Discharge Instructions (Signed)
As discussed, your evaluation today has been largely reassuring.  But, it is important that you monitor your condition carefully, and do not hesitate to return to the ED if you develop new, or concerning changes in your condition. ? ?Otherwise, please follow-up with your physician for appropriate ongoing care. ? ?

## 2015-06-12 NOTE — ED Provider Notes (Signed)
CSN: [id number]     Arrival date & time 06/12/15  1408 History   First MD Initiated Contact with Patient 06/12/15 1630     Chief Complaint  Patient presents with  . Abdominal Pain     (Consider location/radiation/quality/duration/timing/severity/associated sxs/prior Treatment) HPI Patient presents with concern of abdominal pain. Pain is been present for 2 weeks, worse over the past few days, now persistent, severe, focally in the right upper quadrant, with no diffuse radiation. Noted, the patient had a cholecystectomy about 2 months ago. Since that time the pain has been persistent. In addition, the patient saw her physician 8 days ago, had outpatient ultrasound, described as normal aside from fatty liver disease. Over the past 2 days, with increased severe pain, the patient has had no relief with anything. No new fever, diarrhea, though there is nausea and vomiting. No other new complaints. Patient acknowledges multiple medical issues, states that she takes all her medication as directed. Past Medical History  Diagnosis Date  . Hypertension   . Coronary artery disease   . Neuropathy ([place])   . Headache(784.0)   . Arthritis   . Vision loss     due to diabetes  . Myocardial infarction [facility]) 2006    "due to medication"; no evidence of ischemia or infarction by nuclear stress test '11  . Dysrhythmia     tachycardia  . Diabetes mellitus without complication (HCC)     fasting cbg 50-140s  . GERD (gastroesophageal reflux disease)     otc meds  . Migraines     once/month maybe - can last up to two weeks  . Hyperlipemia   . Cardiomyopathy, dilated, nonischemic ([place])   . MI, old     2006  . Neck pain, chronic   . Back pain, chronic   . Depression   . Anxiety   . Hypercholesteremia   . Allergy   . Anemia   . Prolonged QT interval syndrome   . Cholelithiasis   . Pneumonia 2015    [facility]  . Sleep apnea     does not use cpap since losing alot of weight  . CHF (congestive  heart failure) ([place])   . Renal insufficiency    Past Surgical History  Procedure Laterality Date  . Back surgery  2011    Lumbar   . Cervical fusion  2006  . Salpingoophorectomy Bilateral     1 1997. 2nd 2001  . Cardiac catheterization  07/18/2004    50-60% mid LAD, minor luminal irregularities RCA, normal LM and CX, EF 50-55% [facility])  . Lumbar laminectomy/decompression microdiscectomy Right 05/07/2013    Procedure: Right Lumbar one-two laminectomy;  Surgeon: Elaina Hoops, MD;  Location: [place] NEURO ORS;  Service: Neurosurgery;  Laterality: Right;  . Carpel tunnel Bilateral   . Lumbar laminectomy/decompression microdiscectomy Left 10/29/2013    Procedure: Left Lumbar five-Sacral one Laminectomy;  Surgeon: Elaina Hoops, MD;  Location: [place] NEURO ORS;  Service: Neurosurgery;  Laterality: Left;  . Lumbar wound debridement N/A 01/25/2014    Procedure: LUMBAR WOUND DEBRIDEMENT;  Surgeon: Charlie Pitter, MD;  Location: [place] NEURO ORS;  Service: Neurosurgery;  Laterality: N/A;  . Lumbar wound debridement N/A 02/25/2014    Procedure: LUMBAR WOUND DEBRIDEMENT;  Surgeon: Elaina Hoops, MD;  Location: [place] NEURO ORS;  Service: Neurosurgery;  Laterality: N/A;  . Cholecystectomy N/A 04/08/2015    Procedure: LAPAROSCOPIC CHOLECYSTECTOMY WITH INTRAOPERATIVE CHOLANGIOGRAM;  Surgeon: Florene Glen, MD;  Location: [facility] ORS;  Service: General;  Laterality: N/A;   Family History  Problem Relation Age of Onset  . Depression Mother   . Drug abuse Mother   . Early death Mother   . Hypertension Mother   . Varicose Veins Mother   . Diabetes Father   . Early death Father   . Hyperlipidemia Father    Social History  Substance Use Topics  . Smoking status: Never Smoker   . Smokeless tobacco: Never Used  . Alcohol Use: No   OB History    No data available     Review of Systems  Constitutional:       Per HPI, otherwise negative  HENT:       Per HPI, otherwise negative  Respiratory:       Per  HPI, otherwise negative  Cardiovascular:       Per HPI, otherwise negative  Gastrointestinal: Positive for nausea and vomiting.  Endocrine:       Negative aside from HPI  Genitourinary:       Neg aside from HPI   Musculoskeletal:       Per HPI, otherwise negative  Skin: Negative.   Neurological: Negative for syncope.      Allergies  Bactrim; Ciprofloxacin; Rocephin; Vancomycin; Azithromycin; and Food  Home Medications   Prior to Admission medications   Medication Sig Start Date End Date Taking? Authorizing Provider  amitriptyline (ELAVIL) 100 MG tablet Take 100 mg by mouth at bedtime.    Yes Historical Provider, MD  carvedilol (COREG) 25 MG tablet Take 1 tablet (25 mg total) by mouth 2 (two) times daily with a meal. 02/04/14  Yes Kinnie Feil, MD  furosemide (LASIX) 20 MG tablet Take 1 tablet (20 mg total) by mouth 2 (two) times daily. 04/13/15  Yes Dia Crawford III, MD  gabapentin (NEURONTIN) 300 MG capsule Take 600 mg by mouth 3 (three) times daily.    Yes Historical Provider, MD  insulin aspart (NOVOLOG FLEXPEN) 100 UNIT/ML FlexPen Inject 10-20 Units into the skin 3 (three) times daily with meals. Sliding scale 02/04/14  Yes Kinnie Feil, MD  insulin glargine (LANTUS) 100 UNIT/ML injection Inject 16 Units into the skin at bedtime.   Yes Historical Provider, MD  Liraglutide (VICTOZA) 18 MG/3ML SOPN Inject 1.8 mg into the skin daily.   Yes Historical Provider, MD  lisinopril (PRINIVIL,ZESTRIL) 10 MG tablet Take 10 mg by mouth at bedtime.    Yes Historical Provider, MD  magnesium oxide (MAG-OX) 400 (241.3 MG) MG tablet Take 1 tablet by mouth daily. 05/05/15  Yes Historical Provider, MD  nitroGLYCERIN (NITROSTAT) 0.4 MG SL tablet Place 0.4 mg under the tongue every 5 (five) minutes as needed for chest pain.   Yes Historical Provider, MD  potassium chloride (K-DUR) 10 MEQ tablet Take 2 tablets (20 mEq total) by mouth 2 (two) times daily. 04/13/15  Yes Dia Crawford III, MD  rosuvastatin  (CRESTOR) 40 MG tablet Take 40 mg by mouth at bedtime.    Yes Historical Provider, MD  spironolactone (ALDACTONE) 25 MG tablet Take 25 mg by mouth daily.  03/24/15  Yes Historical Provider, MD  tiZANidine (ZANAFLEX) 4 MG tablet Limit 1 tab po bid - tid if tolerated 05/20/15  Yes Mohammed Kindle, MD  topiramate (TOPAMAX) 100 MG tablet Take 100 mg by mouth at bedtime. Take with 25 mg tablet for a 125 mg dose   Yes Historical Provider, MD  topiramate (TOPAMAX) 25 MG tablet Take 25 mg by mouth at bedtime.  Yes Historical Provider, MD  oxyCODONE (OXY IR/ROXICODONE) 5 MG immediate release tablet TAKE 1/2 (ONE HALF) TO 1 (ONE) TABLET BY MOUTH 4 (FOUR) TO 7 (SEVEN) TIMES PER DAY AS TOLERATED. 06/12/15   Carmin Muskrat, MD   BP 161/102 mmHg  Pulse 96  Temp(Src) 97.4 F (36.3 C) (Oral)  Resp 18  Ht 5' 4.5" (1.638 m)  Wt 205 lb 1 oz (93.016 kg)  BMI 34.67 kg/m2  SpO2 100%  LMP 12/21/2014 (Approximate) Physical Exam  Constitutional: She is oriented to person, place, and time. She appears well-developed and well-nourished. No distress.  HENT:  Head: Normocephalic and atraumatic.  Eyes: Conjunctivae and EOM are normal.  Cardiovascular: Normal rate and regular rhythm.   Pulmonary/Chest: Effort normal and breath sounds normal. No stridor. No respiratory distress.  Abdominal: She exhibits no distension. There is tenderness in the right upper quadrant and epigastric area.  Musculoskeletal: She exhibits no edema.  Neurological: She is alert and oriented to person, place, and time. No cranial nerve deficit.  Skin: Skin is warm and dry.  Psychiatric: She has a normal mood and affect.  Nursing note and vitals reviewed.   ED Course  Procedures (including critical care time) Labs Review Labs Reviewed  COMPREHENSIVE METABOLIC PANEL - Abnormal; Notable for the following:    Glucose, Bld 243 (*)    All other components within normal limits  URINALYSIS, ROUTINE W REFLEX MICROSCOPIC (NOT AT [facility]) - Abnormal;  Notable for the following:    Specific Gravity, Urine 1.035 (*)    Glucose, UA >1000 (*)    All other components within normal limits  LIPASE, BLOOD  CBC  URINE MICROSCOPIC-ADD ON    Imaging Review Ct Abdomen Pelvis W Contrast  06/12/2015  CLINICAL DATA:  Right upper quadrant pain, nausea. Prior cholecystectomy and hysterectomy. EXAM: CT ABDOMEN AND PELVIS WITH CONTRAST TECHNIQUE: Multidetector CT imaging of the abdomen and pelvis was performed using the standard protocol following bolus administration of intravenous contrast. CONTRAST:  162mL OMNIPAQUE IOHEXOL 300 MG/ML  SOLN COMPARISON:  CT abdomen pelvis dated 01/29/2014 FINDINGS: Lower chest:  Lung bases are clear. Hepatobiliary: Liver is notable for focal fat/ altered perfusion along the falciform ligament (series 2/ image 23). Status post cholecystectomy. No intrahepatic or extrahepatic ductal dilatation. Pancreas: Within normal limits. Spleen: Within normal limits. Adrenals/Urinary Tract: Adrenal glands are within normal limits. 6 mm cyst in the posterior right lower kidney (series 2/ image 35). Left kidney is within normal limits. No hydronephrosis. Bladder is within normal limits. Stomach/Bowel: Stomach is within normal limits. No evidence of bowel obstruction. Normal appendix (series 2/ image 66). Vascular/Lymphatic: Atherosclerotic calcifications of the abdominal aorta and branch vessels. Duplicated left IVC (series 2/ image 48). No suspicious abdominopelvic lymphadenopathy. Reproductive: Uterus is within normal limits. No adnexal masses. Other: No abdominopelvic ascites. Musculoskeletal: Degenerative changes of the visualized thoracolumbar spine. Status post PLIF at L5-S1. IMPRESSION: No evidence of bowel obstruction.  Normal appendix. Status post cholecystectomy. No intrahepatic or extrahepatic ductal dilatation. No CT findings to account for the patient's right upper quadrant abdominal pain. Electronically Signed   By: Julian Hy  M.D.   On: 06/12/2015 18:55   I have personally reviewed and evaluated these images and lab results as part of my medical decision-making.  Chart review notable for demonstration of prior cholecystectomy, prior ultrasound within the past 2 weeks demonstrating no abscess, but with findings consistent with fatty liver disease.    On repeat exam, following CT scan, the patient appears somewhat  better. I had a lengthy conversation with the patient, her family member about the need for continued evaluation as an outpatient given findings concerning for hepatic steatosis, as well as possible perfusion deficit.  MDM   Final diagnoses:  Right upper quadrant pain  patient presents with concern of ongoing abdominal pain. Here the patient is awake, alert, afebrile, soft, non-peritoneal abdomen, both upper right abdominal pain. Patient's evaluation here largely reassuring, with no evidence for acute abscess, infection. Patient received analgesia, with some reduction in symptoms. With reassuring findings, the patient was discharged in stable condition to follow-up with gastroenterology.  Carmin Muskrat, MD 06/12/15 2053

## 2015-06-13 IMAGING — CR DG LUMBAR SPINE 2-3V
2 series · 2 of 2 positions shown · non-contrast
Comparison: [HOSPITAL] MRI lumbar spine dated 03/01/2013.

CLINICAL DATA: Right L1-2 laminectomy

EXAM:
LUMBAR SPINE - 2-3 VIEW

[lateral (1 of 2)]
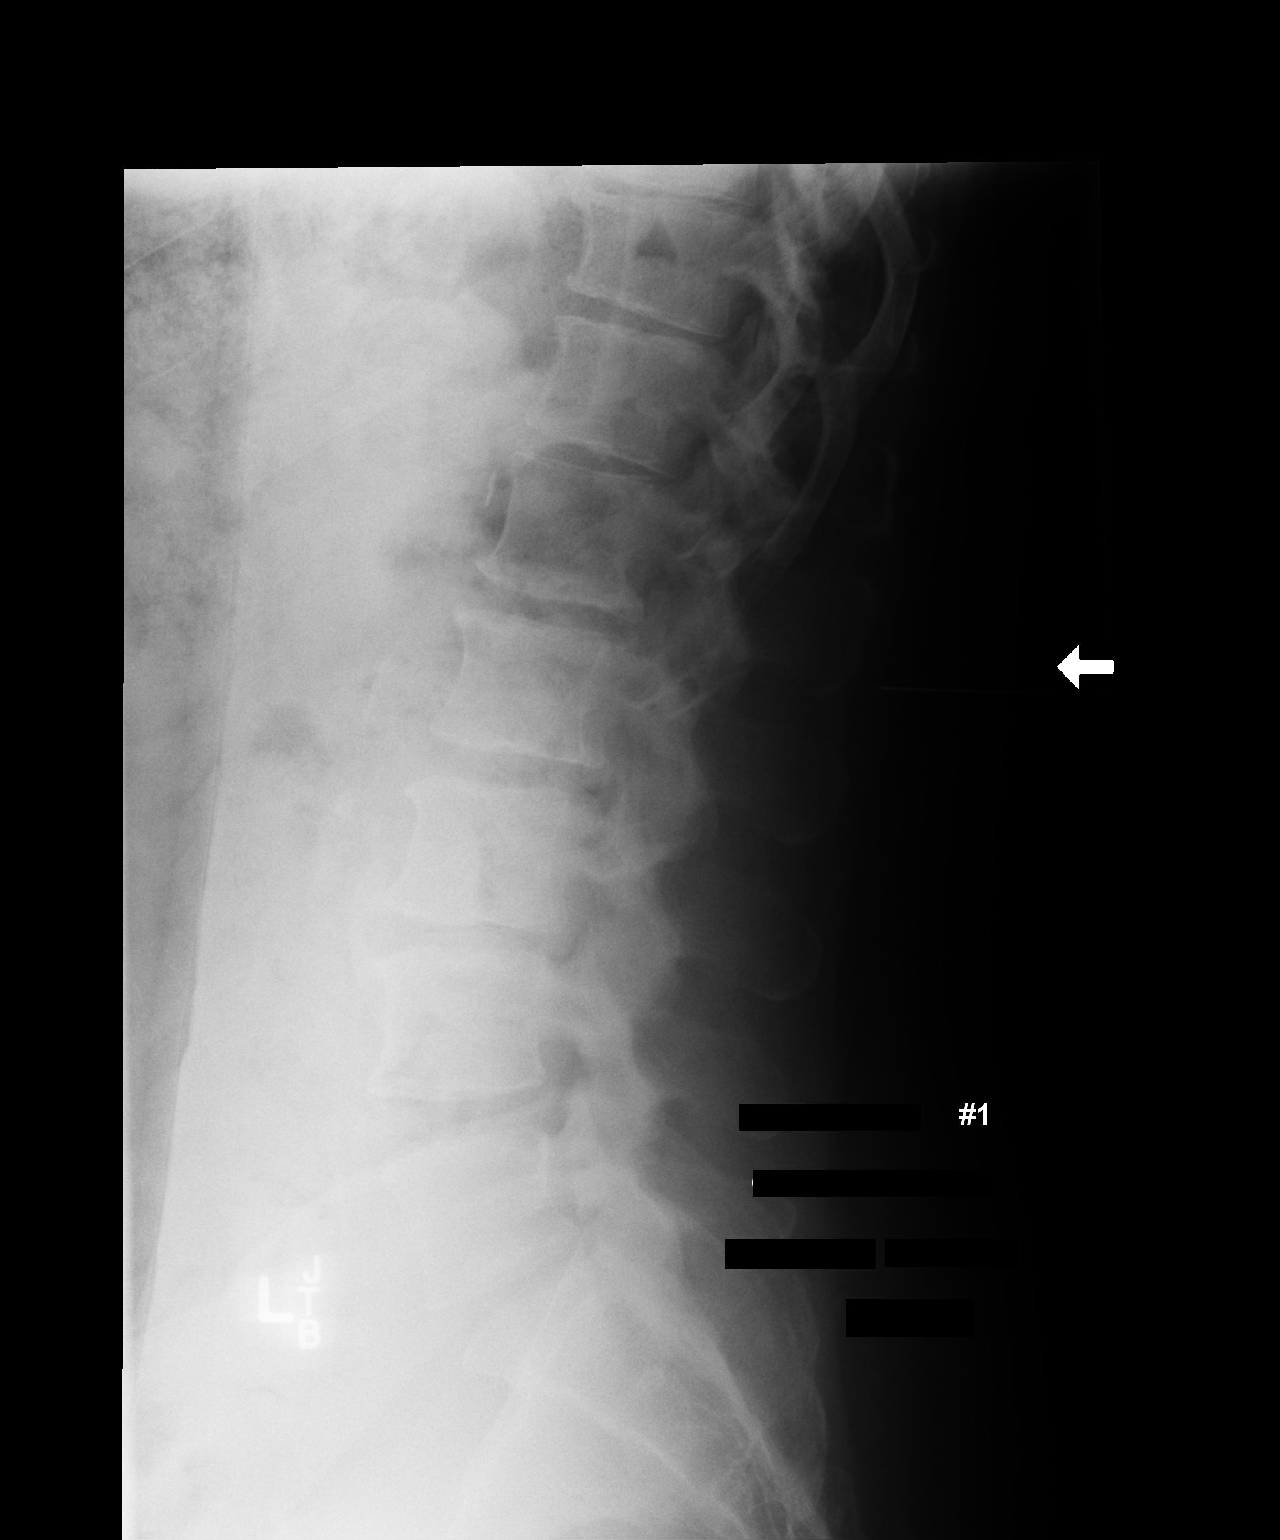

[lateral (2 of 2)]
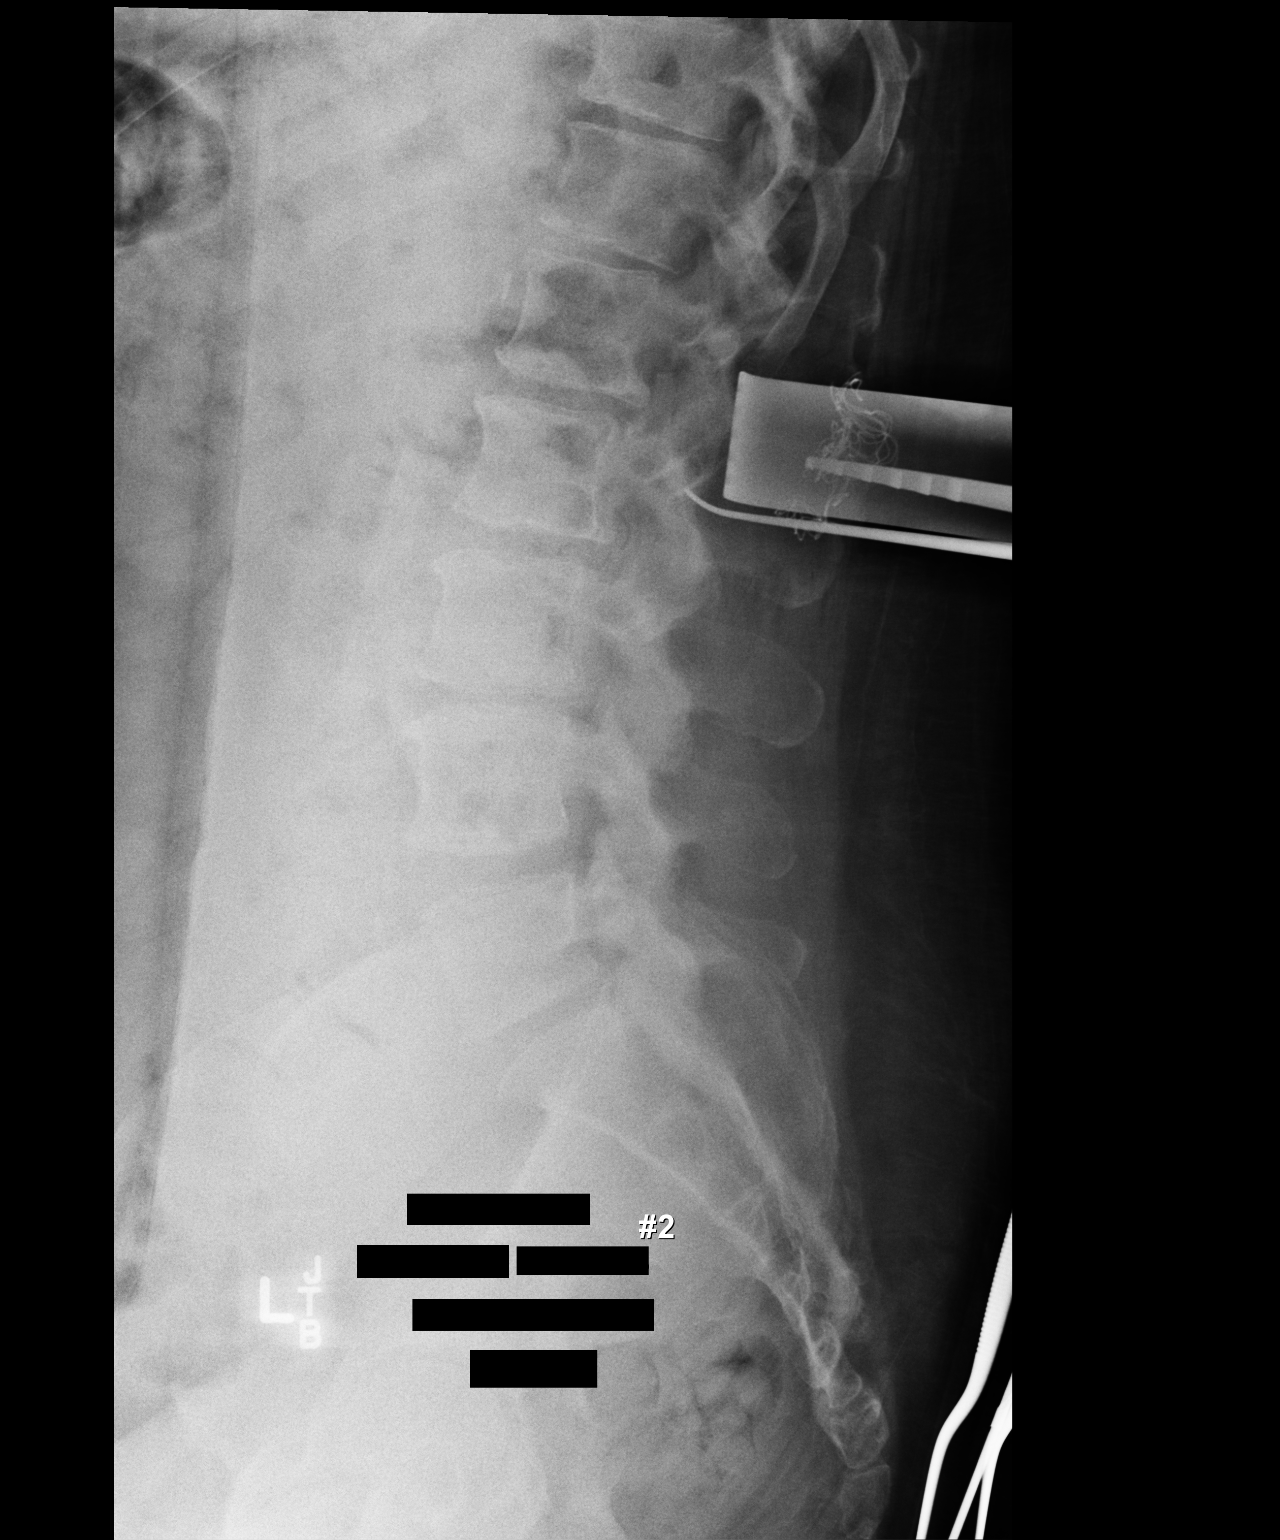

[2 of 2 positions shown; findings below may reference images not displayed]

FINDINGS: Initial lateral radiograph demonstrates a surgical probe at L1-2.

2nd lateral radiograph demonstrates surgical hardware posterior to
L1-2.
IMPRESSION: Lumbar localization as above.

## 2015-06-15 ENCOUNTER — Telehealth: Payer: Self-pay | Admitting: Physician Assistant

## 2015-06-15 ENCOUNTER — Telehealth: Payer: Self-pay | Admitting: Gastroenterology

## 2015-06-15 NOTE — Telephone Encounter (Signed)
I reviewed the notes from the ER she has had negative CT and [place] and recent cholecystectomy.  Patient notified at this time I do not have any earlier appts.  I will place her on the cancellation list.  She is notified she will be contacted if there are any earlier openings

## 2015-06-15 NOTE — Telephone Encounter (Signed)
Pt scheduled for an appt with Dr. Allen Norris on Nov 17th. Pt aware this will be in [place].

## 2015-06-15 NOTE — Telephone Encounter (Signed)
Dr. Burt Knack did gallbladder surg. And she is still having trouble. Patient went to the ED and they told her to see a GI dr. Due to lack of blood flow to a certain part of her liver. Is this something that needs to be scheduled now or wait until Dec. Please call patient today.

## 2015-06-22 ENCOUNTER — Encounter: Payer: Self-pay | Admitting: Pain Medicine

## 2015-06-22 ENCOUNTER — Ambulatory Visit: Payer: PPO | Attending: Pain Medicine | Admitting: Pain Medicine

## 2015-06-22 VITALS — BP 144/100 | HR 96 | Temp 96.7°F | Resp 14 | Ht 65.0 in | Wt 205.0 lb

## 2015-06-22 DIAGNOSIS — M4806 Spinal stenosis, lumbar region: Secondary | ICD-10-CM | POA: Insufficient documentation

## 2015-06-22 DIAGNOSIS — T847XXD Infection and inflammatory reaction due to other internal orthopedic prosthetic devices, implants and grafts, subsequent encounter: Secondary | ICD-10-CM

## 2015-06-22 DIAGNOSIS — M533 Sacrococcygeal disorders, not elsewhere classified: Secondary | ICD-10-CM | POA: Insufficient documentation

## 2015-06-22 DIAGNOSIS — E1140 Type 2 diabetes mellitus with diabetic neuropathy, unspecified: Secondary | ICD-10-CM | POA: Diagnosis not present

## 2015-06-22 DIAGNOSIS — M5137 Other intervertebral disc degeneration, lumbosacral region: Secondary | ICD-10-CM

## 2015-06-22 DIAGNOSIS — R109 Unspecified abdominal pain: Secondary | ICD-10-CM | POA: Insufficient documentation

## 2015-06-22 DIAGNOSIS — G588 Other specified mononeuropathies: Secondary | ICD-10-CM | POA: Diagnosis not present

## 2015-06-22 DIAGNOSIS — M5136 Other intervertebral disc degeneration, lumbar region: Secondary | ICD-10-CM | POA: Diagnosis not present

## 2015-06-22 DIAGNOSIS — M5481 Occipital neuralgia: Secondary | ICD-10-CM

## 2015-06-22 DIAGNOSIS — G43109 Migraine with aura, not intractable, without status migrainosus: Secondary | ICD-10-CM

## 2015-06-22 DIAGNOSIS — R51 Headache: Secondary | ICD-10-CM | POA: Diagnosis present

## 2015-06-22 DIAGNOSIS — Z9889 Other specified postprocedural states: Secondary | ICD-10-CM | POA: Insufficient documentation

## 2015-06-22 DIAGNOSIS — M5030 Other cervical disc degeneration, unspecified cervical region: Secondary | ICD-10-CM

## 2015-06-22 DIAGNOSIS — M5134 Other intervertebral disc degeneration, thoracic region: Secondary | ICD-10-CM

## 2015-06-22 DIAGNOSIS — M5416 Radiculopathy, lumbar region: Secondary | ICD-10-CM | POA: Diagnosis not present

## 2015-06-22 DIAGNOSIS — M542 Cervicalgia: Secondary | ICD-10-CM | POA: Diagnosis present

## 2015-06-22 DIAGNOSIS — M5126 Other intervertebral disc displacement, lumbar region: Secondary | ICD-10-CM | POA: Diagnosis not present

## 2015-06-22 DIAGNOSIS — E1340 Other specified diabetes mellitus with diabetic neuropathy, unspecified: Secondary | ICD-10-CM

## 2015-06-22 DIAGNOSIS — M545 Low back pain: Secondary | ICD-10-CM | POA: Diagnosis present

## 2015-06-22 MED ORDER — TIZANIDINE HCL 4 MG PO TABS: ORAL_TABLET | ORAL | Status: DC

## 2015-06-22 MED ORDER — OXYCODONE HCL 15 MG PO TABS: ORAL_TABLET | ORAL | Status: DC

## 2015-06-22 NOTE — Progress Notes (Signed)
Safety precautions to be maintained throughout the outpatient stay will include: orient to surroundings, keep bed in low position, maintain call bell within reach at all times, provide assistance with transfer out of bed and ambulation.  

## 2015-06-22 NOTE — Progress Notes (Signed)
Subjective:    Patient ID: Marissa [name], female    DOB: Dec 20, 1975, 39 y.o.   MRN: [id number]  HPI  Patient is 39 year old female who returns to pain management Center for further evaluation and treatment of pain involving the neck headaches of her mid lower back and lower extremity regions. Patient has significant pain involving the right abdominal region. The patient is status post cholecystectomy and has been informed that she has a fatty liver as well we have discussed. We have discussed interventional treatment which we prefer to avoid due to patient's general medical condition. The patient may benefit from celiac plexus block and intercostal nerve blocks which we prefer to avoid as explained to patient on today's visit. At the present time patient will follow up with her gastroenterologist and with Dr. Richarda Overlie evaluation of her condition. We will remain available consider modifications of treatment regimen as discussed and explained to patient on today's visit was understanding and agreement with suggested treatment plan. The patient continues to be lower back rigidity pain and will follow up with Dr. Phillip Heal in this regard. Again we prefer to avoid interventional treatment for lower extremity pain and paresthesias due to patient's general medical condition.      Review of Systems     Objective:   Physical Exam  There was tenderness to palpation of the paraspinal musculature region of the cervical facet cervical paraspinal musculature region with well-healed surgical scar of the cervical region without increased warmth and erythema in the region of the scar. The patient appeared to be unremarkable spurring and was decreased grip strength. Tinel and Phalen maneuver without increased pain of significant degree. Palpation over the thoracic paraspinal musculature and thoracic region was with moderate tense to palpation and muscle spasms. No crepitus of the thoracic region was noted.  Palpation over the lumbar paraspinal musculature and the lumbosacral region was possibly muscle spasm and tenderness to palpation with lateral bending and rotation extension and palpation of the lumbar facets reproducing moderately severe discomfort. There was decreased EHL strength and questionably decreased sensory of the L4 dermatomal distribution. EHL strength was decreased there was negative clonus and negative Homans. The abdomen was tenderness to palpation of the right mid abdominal region with tenderness reproduced with palpation without evidence of rebound tenderness or shifting dullness. There was no costovertebral angle tenderness noted.       Assessment & Plan:     Degenerative disc disease lumbar spine L5-S1 hardware in good position without complicating features. Extensive postsurgical changes in the posterior soft tissues without obvious abscess. Moderate spurring changes L1 to contributing to right lateral recess and right foraminal stenosis. Bilateral lateral recess stenosis L2-3. Lateral spurring L3-4 with possible impingement of the extraforaminal L3 nerve root. Spinal stenosis and lateral recess stenosis at L4-5. Shallow extraforaminal disc protrusion on the right which could affect the right L4 nerve root.  Lumbar stenosis  Lumbar facet syndrome  Lumbar radiculopathy  Sacroiliac joint dysfunction  Diabetes mellitus with diabetic neuropathy  Abdominal pain with concern regarding scar formation secondary to prior cholecystectomy as well as fatty liver condition which may be contributing to abdominal pain  Intercostal neuralgia with muscle spasms may be contributing to abdominal discomfort in addition to scar tissue from previous cholecystectomy and fatty liver condition    PLAN   Continue present medication  F/U PCP Dr.Lithavong for evaliation of  BPdiabetes mellitus GI condition  and general medical  condition  F/U surgical evaluation. Patient will undergo  follow-up surgical  evaluation with Dr. [name] as discussed  F/U cardiac evaluation as discussed  GI evaluation as planned for further evaluation of right-sided abdominal pain in patient status post cholecystectomy with history of fatty liver  F/U neurological evaluation. May consider pending follow-up evaluations  May consider radiofrequency rhizolysis or intraspinal procedures pending response to present treatment and F/U evaluation  area at the present time we prefer to avoid considering any interventional treatment for this patient due to patient's general medical condition   Patient to call [facility] Center should patient have concerns prior to scheduled return appointment.

## 2015-06-22 NOTE — Patient Instructions (Addendum)
PLAN    Continue present medications. tizanidine and oxycodone  F/U PCP  Dr Richarda Overlie for evaliation of  BP and general medical  condition. Blood pressure elevated today please follow up with Dr. Richarda Overlie and cardiologist    F/U surgical evaluation. Follow-up with Dr. [name] as discussed   F/U neurological evaluation.  May consider radiofrequency rhizolysis or intraspinal procedures pending response to present treatment and F/U evaluation.  Patient to call [facility] should patient have concerns prior to scheduled return appointment.   A prescription for TIZANIDINE was sent to your pharmacy and should be available for pickup today.  A prescription for OXYCODONE was given to you today.

## 2015-07-01 ENCOUNTER — Ambulatory Visit (INDEPENDENT_AMBULATORY_CARE_PROVIDER_SITE_OTHER): Payer: PPO | Admitting: Gastroenterology

## 2015-07-01 ENCOUNTER — Encounter: Payer: Self-pay | Admitting: Gastroenterology

## 2015-07-01 VITALS — BP 134/92 | HR 114 | Temp 98.7°F | Ht 65.0 in | Wt 205.0 lb

## 2015-07-01 DIAGNOSIS — R109 Unspecified abdominal pain: Secondary | ICD-10-CM

## 2015-07-01 DIAGNOSIS — K760 Fatty (change of) liver, not elsewhere classified: Secondary | ICD-10-CM | POA: Diagnosis not present

## 2015-07-01 NOTE — Progress Notes (Signed)
Gastroenterology Consultation  Referring Provider:     [name], MD Primary Care Physician:  [name], MD Primary Gastroenterologist:  Dr. Allen Norris     Reason for Consultation:     Abdominal pain        HPI:   Marissa [name] is a 39 y.o. y/o female referred for consultation & management of abdominal pain by Dr. Netty Starring, Lucianne Muss, MD.  This woman comes in today with a report of abdominal pain. The patient reports the abdominal pain to be in the right middle abdomen. She had her gallbladder taken out a few months ago and states that the pain started a month after that. The patient suffers from muscle spasms in her legs and Charlie horses. She states that she has restless leg syndrome and is on a muscle relaxer. She also reports that she's had surgery on her back for back pain. She now states that the pain comes in sharp stabbing pains in her right side. There is no report of any black stools or bloody stools. She also denies that moving her bowels makes the pain any better or worse. The patient also denies that eating or drinking makes the pain any better or worse. In fact she states that no GI function whatsoever has anything that impacts the abdominal pain. She states that it is there when she is sitting down and doing nothing and also when she is moving around. There is no report of the pain waking her up in the left night.  Past Medical History  Diagnosis Date  . Hypertension   . Coronary artery disease   . Neuropathy ([place])   . Headache(784.0)   . Arthritis   . Vision loss     due to diabetes  . Myocardial infarction [facility]) 2006    "due to medication"; no evidence of ischemia or infarction by nuclear stress test '11  . Dysrhythmia     tachycardia  . Diabetes mellitus without complication (HCC)     fasting cbg 50-140s  . GERD (gastroesophageal reflux disease)     otc meds  . Migraines     once/month maybe - can last up to two weeks  . Hyperlipemia   .  Cardiomyopathy, dilated, nonischemic ([place])   . MI, old     2006  . Neck pain, chronic   . Back pain, chronic   . Depression   . Anxiety   . Hypercholesteremia   . Allergy   . Anemia   . Prolonged QT interval syndrome   . Cholelithiasis   . Pneumonia 2015    [facility]  . Sleep apnea     does not use cpap since losing alot of weight  . CHF (congestive heart failure) ([place])   . Renal insufficiency     Past Surgical History  Procedure Laterality Date  . Back surgery  2011    Lumbar   . Cervical fusion  2006  . Salpingoophorectomy Bilateral     1 1997. 2nd 2001  . Cardiac catheterization  07/18/2004    50-60% mid LAD, minor luminal irregularities RCA, normal LM and CX, EF 50-55% [facility])  . Lumbar laminectomy/decompression microdiscectomy Right 05/07/2013    Procedure: Right Lumbar one-two laminectomy;  Surgeon: Elaina Hoops, MD;  Location: [place] NEURO ORS;  Service: Neurosurgery;  Laterality: Right;  . Carpel tunnel Bilateral   . Lumbar laminectomy/decompression microdiscectomy Left 10/29/2013    Procedure: Left Lumbar five-Sacral one Laminectomy;  Surgeon: Elaina Hoops,  MD;  Location: [place] NEURO ORS;  Service: Neurosurgery;  Laterality: Left;  . Lumbar wound debridement N/A 01/25/2014    Procedure: LUMBAR WOUND DEBRIDEMENT;  Surgeon: Charlie Pitter, MD;  Location: [place] NEURO ORS;  Service: Neurosurgery;  Laterality: N/A;  . Lumbar wound debridement N/A 02/25/2014    Procedure: LUMBAR WOUND DEBRIDEMENT;  Surgeon: Elaina Hoops, MD;  Location: [place] NEURO ORS;  Service: Neurosurgery;  Laterality: N/A;  . Cholecystectomy N/A 04/08/2015    Procedure: LAPAROSCOPIC CHOLECYSTECTOMY WITH INTRAOPERATIVE CHOLANGIOGRAM;  Surgeon: Florene Glen, MD;  Location: [facility] ORS;  Service: General;  Laterality: N/A;    Prior to Admission medications   Medication Sig Start Date End Date Taking? Authorizing Provider  amitriptyline (ELAVIL) 100 MG tablet Take 100 mg by mouth at bedtime.    Yes Historical  Provider, MD  carvedilol (COREG) 25 MG tablet Take 1 tablet (25 mg total) by mouth 2 (two) times daily with a meal. 02/04/14  Yes Kinnie Feil, MD  furosemide (LASIX) 20 MG tablet Take 1 tablet (20 mg total) by mouth 2 (two) times daily. 04/13/15  Yes Dia Crawford III, MD  gabapentin (NEURONTIN) 300 MG capsule Take 600 mg by mouth 3 (three) times daily.    Yes Historical Provider, MD  insulin aspart (NOVOLOG FLEXPEN) 100 UNIT/ML FlexPen Inject 10-20 Units into the skin 3 (three) times daily with meals. Sliding scale 02/04/14  Yes Kinnie Feil, MD  insulin glargine (LANTUS) 100 UNIT/ML injection Inject 16 Units into the skin at bedtime.   Yes Historical Provider, MD  Liraglutide (VICTOZA) 18 MG/3ML SOPN Inject 1.8 mg into the skin daily.   Yes Historical Provider, MD  lisinopril (PRINIVIL,ZESTRIL) 10 MG tablet Take 10 mg by mouth at bedtime.    Yes Historical Provider, MD  magnesium oxide (MAG-OX) 400 (241.3 MG) MG tablet Take 1 tablet by mouth daily. 05/05/15  Yes Historical Provider, MD  nitroGLYCERIN (NITROSTAT) 0.4 MG SL tablet Place 0.4 mg under the tongue every 5 (five) minutes as needed for chest pain.   Yes Historical Provider, MD  oxyCODONE (ROXICODONE) 15 MG immediate release tablet Limit 1/2-1 tab by mouth 4-7 times per day if tolerated 06/22/15  Yes Mohammed Kindle, MD  potassium chloride (K-DUR) 10 MEQ tablet Take 2 tablets (20 mEq total) by mouth 2 (two) times daily. 04/13/15  Yes Dia Crawford III, MD  rosuvastatin (CRESTOR) 40 MG tablet Take 40 mg by mouth at bedtime.    Yes Historical Provider, MD  spironolactone (ALDACTONE) 25 MG tablet Take 25 mg by mouth daily.  03/24/15  Yes Historical Provider, MD  tiZANidine (ZANAFLEX) 4 MG tablet Limit 1 tab po bid - tid if tolerated 06/22/15  Yes Mohammed Kindle, MD  topiramate (TOPAMAX) 100 MG tablet Take 100 mg by mouth at bedtime. Take with 25 mg tablet for a 125 mg dose   Yes Historical Provider, MD  topiramate (TOPAMAX) 25 MG tablet Take 25 mg by  mouth at bedtime.   Yes Historical Provider, MD    Family History  Problem Relation Age of Onset  . Depression Mother   . Drug abuse Mother   . Early death Mother   . Hypertension Mother   . Varicose Veins Mother   . Diabetes Father   . Early death Father   . Hyperlipidemia Father      Social History  Substance Use Topics  . Smoking status: Never Smoker   . Smokeless tobacco: Never Used  . Alcohol Use: No  Allergies as of 07/01/2015 - Review Complete 07/01/2015  Allergen Reaction Noted  . Bactrim [sulfamethoxazole-trimethoprim] Nausea And Vomiting   . Ciprofloxacin Swelling 01/12/2014  . Rocephin [ceftriaxone sodium in dextrose] Nausea And Vomiting   . Vancomycin Nausea And Vomiting   . Azithromycin Itching and Rash 05/06/2013  . Food Itching and Rash 05/06/2013    Review of Systems:    All systems reviewed and negative except where noted in HPI.   Physical Exam:  BP 134/92 mmHg  Pulse 114  Temp(Src) 98.7 F (37.1 C) (Oral)  Ht 5\' 5"  (1.651 m)  Wt 205 lb (92.987 kg)  BMI 34.11 kg/m2  LMP 12/21/2014 (Approximate) Patient's last menstrual period was 12/21/2014 (approximate). Psych:  Alert and cooperative. Normal mood and affect. General:   Alert,  Well-developed, well-nourished, pleasant and cooperative in NAD Head:  Normocephalic and atraumatic. Eyes:  Sclera clear, no icterus.   Conjunctiva pink. Ears:  Normal auditory acuity. Nose:  No deformity, discharge, or lesions. Mouth:  No deformity or lesions,oropharynx pink & moist. Neck:  Supple; no masses or thyromegaly. Lungs:  Respirations even and unlabored.  Clear throughout to auscultation.   No wheezes, crackles, or rhonchi. No acute distress. Heart:  Regular rate and rhythm; no murmurs, clicks, rubs, or gallops. Abdomen:  Normal bowel sounds.  No bruits.  Soft, non-tender and non-distended without masses, hepatosplenomegaly or hernias noted.  No guarding or rebound tenderness.  Positive Carnett sign.     Rectal:  Deferred.  Msk:  Symmetrical without gross deformities.  Good, equal movement & strength bilaterally. Pulses:  Normal pulses noted. Extremities:  No clubbing or edema.  No cyanosis. Neurologic:  Alert and oriented x3;  grossly normal neurologically. Skin:  Intact without significant lesions or rashes.  Multiple tattoos on the arms chest and neck No jaundice. Lymph Nodes:  No significant cervical adenopathy. Psych:  Alert and cooperative. Normal mood and affect.  Imaging Studies: US Abdomen Complete  06/07/2015  CLINICAL DATA:  Right upper quadrant pain x 2-3 weeks, nausea/vomiting, status post cholecystectomy EXAM: ULTRASOUND ABDOMEN COMPLETE COMPARISON:  04/04/2015 FINDINGS: Gallbladder: Surgically absent. Common bile duct: Diameter: 5 mm Liver: Hyperechoic hepatic parenchyma, suggesting hepatic steatosis. No focal hepatic lesion is seen. IVC: No abnormality visualized. Pancreas: Not discretely visualized due to overlying bowel gas. Spleen: Size and appearance within normal limits. Right Kidney: Length: 8.9 cm.  No mass or hydronephrosis. Left Kidney: Length: 10.4 cm.  No mass or hydronephrosis. Abdominal aorta: No aneurysm visualized. Other findings: None. IMPRESSION: Status post cholecystectomy. Hyperechoic hepatic parenchyma, suggesting hepatic steatosis. Electronically Signed   By: Julian Hy M.D.   On: 06/07/2015 12:16   Ct Abdomen Pelvis W Contrast  06/12/2015  CLINICAL DATA:  Right upper quadrant pain, nausea. Prior cholecystectomy and hysterectomy. EXAM: CT ABDOMEN AND PELVIS WITH CONTRAST TECHNIQUE: Multidetector CT imaging of the abdomen and pelvis was performed using the standard protocol following bolus administration of intravenous contrast. CONTRAST:  115mL OMNIPAQUE IOHEXOL 300 MG/ML  SOLN COMPARISON:  CT abdomen pelvis dated 01/29/2014 FINDINGS: Lower chest:  Lung bases are clear. Hepatobiliary: Liver is notable for focal fat/ altered perfusion along the falciform  ligament (series 2/ image 23). Status post cholecystectomy. No intrahepatic or extrahepatic ductal dilatation. Pancreas: Within normal limits. Spleen: Within normal limits. Adrenals/Urinary Tract: Adrenal glands are within normal limits. 6 mm cyst in the posterior right lower kidney (series 2/ image 35). Left kidney is within normal limits. No hydronephrosis. Bladder is within normal limits. Stomach/Bowel: Stomach is within normal  limits. No evidence of bowel obstruction. Normal appendix (series 2/ image 66). Vascular/Lymphatic: Atherosclerotic calcifications of the abdominal aorta and branch vessels. Duplicated left IVC (series 2/ image 48). No suspicious abdominopelvic lymphadenopathy. Reproductive: Uterus is within normal limits. No adnexal masses. Other: No abdominopelvic ascites. Musculoskeletal: Degenerative changes of the visualized thoracolumbar spine. Status post PLIF at L5-S1. IMPRESSION: No evidence of bowel obstruction.  Normal appendix. Status post cholecystectomy. No intrahepatic or extrahepatic ductal dilatation. No CT findings to account for the patient's right upper quadrant abdominal pain. Electronically Signed   By: Julian Hy M.D.   On: 06/12/2015 18:55    Assessment and Plan:   Marissa [name] is a 39 y.o. y/o female who comes in today with right-sided abdominal pain. The patient has been seen by surgery who thought she would benefit from a GI consultation. The patient has pain that is not related to eating drinking or bowel movements. She also had a CT scan of the abdomen that did not show any cause for her abdominal pain. The patient does have a history of back pain and restless leg syndrome with muscle spasms in her legs. The patient is on chronic Flexeril. On physical exam today the abdominal tenderness was unchanged with flexion of the abdominal wall muscles while raising the leg 6 inches above the exam table and palpating with 1 finger. The patient has been told that this is  most consistent with musculoskeletal pain in light of her having no GI symptoms associated with it and a history of multiple other muscle problems. She has already been started on a muscle relaxer for her leg spasms and she will add an anti-inflammatory 3 times a day for the next 2 weeks with food. She has also been told to use a warm compress to her abdomen to help with the pain. The patient was also told that she had a fatty liver but her liver enzymes are normal. The patient has been told that this would be helped by losing weight despite her having lost 105 pounds while dieting. The patient states she understands the plan and agrees with it.   Note: This dictation was prepared with Dragon dictation along with smaller phrase technology. Any transcriptional errors that result from this process are unintentional.

## 2015-07-05 ENCOUNTER — Other Ambulatory Visit: Payer: Self-pay | Admitting: Pain Medicine

## 2015-07-14 ENCOUNTER — Ambulatory Visit: Payer: PPO | Admitting: Physician Assistant

## 2015-07-20 ENCOUNTER — Encounter: Payer: Self-pay | Admitting: Pain Medicine

## 2015-07-20 ENCOUNTER — Ambulatory Visit: Payer: PPO | Attending: Pain Medicine | Admitting: Pain Medicine

## 2015-07-20 VITALS — BP 152/91 | HR 98 | Temp 98.3°F | Resp 16 | Ht 65.0 in | Wt 203.0 lb

## 2015-07-20 DIAGNOSIS — M4806 Spinal stenosis, lumbar region: Secondary | ICD-10-CM | POA: Insufficient documentation

## 2015-07-20 DIAGNOSIS — M5030 Other cervical disc degeneration, unspecified cervical region: Secondary | ICD-10-CM

## 2015-07-20 DIAGNOSIS — M5126 Other intervertebral disc displacement, lumbar region: Secondary | ICD-10-CM | POA: Diagnosis not present

## 2015-07-20 DIAGNOSIS — M546 Pain in thoracic spine: Secondary | ICD-10-CM | POA: Diagnosis present

## 2015-07-20 DIAGNOSIS — M5116 Intervertebral disc disorders with radiculopathy, lumbar region: Secondary | ICD-10-CM | POA: Diagnosis not present

## 2015-07-20 DIAGNOSIS — Z9049 Acquired absence of other specified parts of digestive tract: Secondary | ICD-10-CM | POA: Diagnosis not present

## 2015-07-20 DIAGNOSIS — M545 Low back pain: Secondary | ICD-10-CM | POA: Diagnosis present

## 2015-07-20 DIAGNOSIS — E1340 Other specified diabetes mellitus with diabetic neuropathy, unspecified: Secondary | ICD-10-CM

## 2015-07-20 DIAGNOSIS — T847XXD Infection and inflammatory reaction due to other internal orthopedic prosthetic devices, implants and grafts, subsequent encounter: Secondary | ICD-10-CM

## 2015-07-20 DIAGNOSIS — G43109 Migraine with aura, not intractable, without status migrainosus: Secondary | ICD-10-CM

## 2015-07-20 DIAGNOSIS — E1140 Type 2 diabetes mellitus with diabetic neuropathy, unspecified: Secondary | ICD-10-CM | POA: Diagnosis not present

## 2015-07-20 DIAGNOSIS — M533 Sacrococcygeal disorders, not elsewhere classified: Secondary | ICD-10-CM | POA: Diagnosis not present

## 2015-07-20 DIAGNOSIS — M5137 Other intervertebral disc degeneration, lumbosacral region: Secondary | ICD-10-CM

## 2015-07-20 DIAGNOSIS — M5134 Other intervertebral disc degeneration, thoracic region: Secondary | ICD-10-CM

## 2015-07-20 DIAGNOSIS — Z9889 Other specified postprocedural states: Secondary | ICD-10-CM | POA: Diagnosis not present

## 2015-07-20 DIAGNOSIS — M5481 Occipital neuralgia: Secondary | ICD-10-CM

## 2015-07-20 DIAGNOSIS — M542 Cervicalgia: Secondary | ICD-10-CM | POA: Diagnosis present

## 2015-07-20 MED ORDER — TIZANIDINE HCL 4 MG PO TABS: ORAL_TABLET | ORAL | Status: DC

## 2015-07-20 MED ORDER — OXYCODONE HCL 15 MG PO TABS: ORAL_TABLET | ORAL | Status: DC

## 2015-07-20 NOTE — Progress Notes (Signed)
Subjective:    Patient ID: Marissa [name], female    DOB: [birth year], 39 y.o.   MRN: [id number]  HPI  The patient is a 39 year old female who returns to pain management for further evaluation and treatment of headaches as well as pain involving the cervical thoracic and lumbar lower extremity regions. The patient is status post surgical intervention of the cervical and lumbar regions and is with significant cardiac history as well as with history of diabetes mellitus. We informed patient is to have patient follow up with Dr.Lithavong  at this time for evaluation of blood pressure noted to be elevated on today's evaluation. The patient will follow-up with cardiologist as well. We informed patient we will for to avoid interventional treatment due to patient's general medical condition. We will continue Zanaflex and oxycodone as prescribed at this time and we'll remain available to modified treatment regimen is felt to be necessary pending follow-up evaluation. Patient was understanding and agreement suggested treatment plan. The patient will undergo follow-up neurological surgical cardiac evaluation as well and we will continue medications as discussed. Patient agreed to suggested treatment plan.    Review of Systems     Objective:   Physical Exam  There was tends to palpation over the paraspinal must reason cervical region cervical facet region. Palpation of the splenius capitis and occipitalis regions reproduces moderate discomfort. There was well-healed surgical scar of the cervical region without increased warmth or erythema in the region of the scar. Patient appeared to be unremarkable Spurling's maneuver. There was tenderness to palpation over the acromial clavicular and glenohumeral joint regions. Patient appeared to be with decreased grip strength and Tinel and Phalen's maneuver were without increased pain of significant degree. Palpation over the thoracic region thoracic facet region was  attends to palpation of moderate degree with no crepitus of the thoracic region noted. Palpation over the lumbar paraspinal must reason lumbar facet region was with moderate to moderately severe discomfort with lateral bending rotation extension and palpation of the lumbar facets reproducing moderately severe discomfort. Straight leg raising was limited to approximately 2010 without a definite increased pain with dorsiflexion noted. There was question decreased sensation along the L5 dermatomal distribution. EHL strength was decreased. There was negative clonus negative Homans. DTRs difficult to elicit patient had difficulty relaxing. There was negative clonus negative Homans Abdomen nontender with no costovertebral tenderness noted.          Assessment & Plan:    Degenerative disc disease lumbar spine L5-S1 hardware in good position without complicating features. Extensive postsurgical changes in the posterior soft tissues without obvious abscess. Moderate spurring changes L1 to contributing to right lateral recess and right foraminal stenosis. Bilateral lateral recess stenosis L2-3. Lateral spurring L3-4 with possible impingement of the extraforaminal L3 nerve root. Spinal stenosis and lateral recess stenosis at L4-5. Shallow extraforaminal disc protrusion on the right which could affect the right L4 nerve root.  Lumbar stenosis  Lumbar facet syndrome  Lumbar radiculopathy  Sacroiliac joint dysfunction  Diabetes mellitus with diabetic neuropathy  Abdominal pain with concern regarding scar formation secondary to prior cholecystectomy as well as fatty liver condition which may be contributing to abdominal pain     PLAN   Continue present medications. tizanidine and oxycodone  F/U PCP  Dr Richarda Overlie for evaliation of  BP and general medical  condition. Blood pressure elevated today please follow up with Dr. Richarda Overlie and cardiologist Please call [place]  today regarding elevated  blood pressure  F/U surgical evaluation. Follow-up with Dr. [name] as discussed   F/U neurological evaluation.  May consider radiofrequency rhizolysis or intraspinal procedures pending response to present treatment and F/U evaluation.  Patient to call [facility] should patient have concerns prior to scheduled return appointment.

## 2015-07-20 NOTE — Patient Instructions (Addendum)
PLAN    Continue present medications. tizanidine and oxycodone  F/U PCP  Dr Richarda Overlie for evaliation of  BP and general medical  condition. Blood pressure elevated today please follow up with Dr. Richarda Overlie and cardiologist Please call Dr.Lithavong  today regarding elevated blood pressure    F/U surgical evaluation. Follow-up with Dr. [name] as discussed   F/U neurological evaluation.  May consider radiofrequency rhizolysis or intraspinal procedures pending response to present treatment and F/U evaluation.  Patient to call [facility] should patient have concerns prior to scheduled return appointment.

## 2015-07-20 NOTE — Progress Notes (Signed)
Safety precautions to be maintained throughout the outpatient stay will include: orient to surroundings, keep bed in low position, maintain call bell within reach at all times, provide assistance with transfer out of bed and ambulation.  

## 2015-07-22 ENCOUNTER — Ambulatory Visit: Payer: PPO | Admitting: Pain Medicine

## 2015-08-19 ENCOUNTER — Ambulatory Visit: Payer: PPO | Attending: Pain Medicine | Admitting: Pain Medicine

## 2015-08-19 ENCOUNTER — Encounter: Payer: Self-pay | Admitting: Pain Medicine

## 2015-08-19 VITALS — BP 152/89 | HR 94 | Temp 98.0°F | Resp 16 | Ht 65.0 in | Wt 195.0 lb

## 2015-08-19 DIAGNOSIS — M5134 Other intervertebral disc degeneration, thoracic region: Secondary | ICD-10-CM

## 2015-08-19 DIAGNOSIS — M5137 Other intervertebral disc degeneration, lumbosacral region: Secondary | ICD-10-CM

## 2015-08-19 DIAGNOSIS — E1340 Other specified diabetes mellitus with diabetic neuropathy, unspecified: Secondary | ICD-10-CM

## 2015-08-19 DIAGNOSIS — M5126 Other intervertebral disc displacement, lumbar region: Secondary | ICD-10-CM | POA: Diagnosis not present

## 2015-08-19 DIAGNOSIS — M4606 Spinal enthesopathy, lumbar region: Secondary | ICD-10-CM | POA: Insufficient documentation

## 2015-08-19 DIAGNOSIS — M5416 Radiculopathy, lumbar region: Secondary | ICD-10-CM | POA: Insufficient documentation

## 2015-08-19 DIAGNOSIS — M546 Pain in thoracic spine: Secondary | ICD-10-CM | POA: Diagnosis not present

## 2015-08-19 DIAGNOSIS — M5481 Occipital neuralgia: Secondary | ICD-10-CM

## 2015-08-19 DIAGNOSIS — M5030 Other cervical disc degeneration, unspecified cervical region: Secondary | ICD-10-CM

## 2015-08-19 DIAGNOSIS — M797 Fibromyalgia: Secondary | ICD-10-CM | POA: Diagnosis not present

## 2015-08-19 DIAGNOSIS — E1140 Type 2 diabetes mellitus with diabetic neuropathy, unspecified: Secondary | ICD-10-CM | POA: Diagnosis not present

## 2015-08-19 DIAGNOSIS — M533 Sacrococcygeal disorders, not elsewhere classified: Secondary | ICD-10-CM | POA: Diagnosis not present

## 2015-08-19 DIAGNOSIS — M792 Neuralgia and neuritis, unspecified: Secondary | ICD-10-CM | POA: Diagnosis not present

## 2015-08-19 DIAGNOSIS — M542 Cervicalgia: Secondary | ICD-10-CM | POA: Diagnosis not present

## 2015-08-19 DIAGNOSIS — M47896 Other spondylosis, lumbar region: Secondary | ICD-10-CM | POA: Insufficient documentation

## 2015-08-19 DIAGNOSIS — M47817 Spondylosis without myelopathy or radiculopathy, lumbosacral region: Secondary | ICD-10-CM | POA: Diagnosis not present

## 2015-08-19 DIAGNOSIS — T847XXD Infection and inflammatory reaction due to other internal orthopedic prosthetic devices, implants and grafts, subsequent encounter: Secondary | ICD-10-CM

## 2015-08-19 DIAGNOSIS — R51 Headache: Secondary | ICD-10-CM | POA: Diagnosis not present

## 2015-08-19 DIAGNOSIS — M4806 Spinal stenosis, lumbar region: Secondary | ICD-10-CM | POA: Diagnosis not present

## 2015-08-19 DIAGNOSIS — G43109 Migraine with aura, not intractable, without status migrainosus: Secondary | ICD-10-CM

## 2015-08-19 MED ORDER — OXYCODONE HCL 15 MG PO TABS: ORAL_TABLET | ORAL | Status: DC

## 2015-08-19 NOTE — Progress Notes (Signed)
Subjective:    Patient ID: Marissa [name], female    DOB: Sep 05, 1975, 40 y.o.   MRN: [id number]  HPI  The patient is a 40 year old female who returns to pain management for further evaluation and treatment of pain involving the neck headaches entire back upper and lower extremity regions. The patient states that the pain present time is fairly well controlled. The patient continues to have multiple regions of tenderness to palpation involving multiple musculature areas. The patient is status post surgical intervention of both the cervical and lumbar regions without plans for additional surgery at the present time. He also has significant cardiac history as well as diabetes mellitus and other medical conditions. Decision has been made to avoid interventional treatment and continue with noninterventional treatment measures at this time. We discussed patient undergoing massage consisting of deep myofascial release massage for treatment of multiple regions in attempt to decrease severity of patient's symptoms, minimize progression of symptoms, and avoid need for more involved treatment. We will continue presently prescribed medications and remain available to consider modification of treatment regimen pending follow-up evaluation as discussed and as explained to patient on today's visit. The patient agreed to suggested treatment plan      Review of Systems     Objective:   Physical Exam  There was tenderness to palpation paraspinal muscular treat the cervical region cervical facet region palpation which reproduces pain of moderate degree. There was well-healed surgical scar of the cervical region without increased warmth and erythema in the region of the scar. Palpation over the region of the acromioclavicular and glenohumeral joint regions reproduced pain of moderate degree. The patient appeared to be unremarkable Spurling's maneuver with limited range of motion of the cervical spine. There was  tenderness to palpation of the trapezius levator scapula and rhomboid musculature regions of moderate to moderately severe degree. Palpation over the thoracic region was attends to palpation of moderate to moderately severe degree with no crepitus of the thoracic region noted. Palpation over the region of the lumbar paraspinal musculature region lumbar facet region associated with moderately severe to severe increase of pain with lateral bending rotation extension and palpation of the lumbar facets. There was tends to palpation over the PSIS and PII S region of moderate to moderately severe degree as well as tends to palpation of the greater trochanteric region and iliotibial band regions. There was questionably decreased sensation of the L5 dermatomal distribution. EHL strength appeared to be decreased there was negative clonus negative Homans. DTRs were difficult to elicit patient had difficulty relaxing. Abdomen nontender with no costovertebral tenderness noted.         Assessment & Plan:    L5-S1 hardware in good position without complicating features. Extensive postsurgical changes in the posterior soft tissues without obvious abscess. Moderate spurring changes L1 to contributing to right lateral recess and right foraminal stenosis. Bilateral lateral recess stenosis L2-3. Lateral spurring L3-4 with possible impingement of the extraforaminal L3 nerve root. Spinal stenosis and lateral recess stenosis at L4-5. Shallow extraforaminal disc protrusion on the right which could affect the right L4 nerve root.  Lumbar stenosis  Lumbar facet syndrome  Lumbar radiculopathy  Sacroiliac joint dysfunction  Diabetes mellitus with diabetic neuropathy  Fibromyalgia     PLAN   Continue present medications. tizanidine and oxycodone  F/U PCP  Dr Richarda Overlie for evaliation of  BP and general medical  condition.   F/U surgical evaluation. Follow-up with Dr. [name] as discussed   Recommend deep  myofascial massage/release 2 times per week with caution to avoid aggravation of symptoms   F/U neurological evaluation. Follow-up neurological evaluation as discussed  May consider radiofrequency rhizolysis or intraspinal procedures pending response to present treatment and F/U evaluation.. At the present time we plan to avoid interventional treatment  Patient to call [facility] should patient have concerns prior to scheduled return appointment.

## 2015-08-19 NOTE — Progress Notes (Signed)
Safety precautions to be maintained throughout the outpatient stay will include: orient to surroundings, keep bed in low position, maintain call bell within reach at all times, provide assistance with transfer out of bed and ambulation.  

## 2015-08-19 NOTE — Patient Instructions (Addendum)
PLAN    Continue present medications. tizanidine and oxycodone  F/U PCP  Dr Richarda Overlie for evaliation of  BP and general medical  condition.   F/U surgical evaluation. Follow-up with Dr. [name] as discussed   Recommend deep myofascial massage/release 2 times per week with caution to avoid aggravation of symptoms   F/U neurological evaluation. Follow-up neurological evaluation as discussed  May consider radiofrequency rhizolysis or intraspinal procedures pending response to present treatment and F/U evaluation.  Patient to call [facility] Center should patient have concerns prior to scheduled return appointment.

## 2015-09-13 ENCOUNTER — Other Ambulatory Visit: Payer: Self-pay | Admitting: Family Medicine

## 2015-09-13 ENCOUNTER — Ambulatory Visit
Admission: RE | Admit: 2015-09-13 | Discharge: 2015-09-13 | Disposition: A | Discharge status: Home / Self Care | Payer: PPO | Source: Ambulatory Visit | Attending: Family Medicine | Admitting: Family Medicine

## 2015-09-13 DIAGNOSIS — H9202 Otalgia, left ear: Secondary | ICD-10-CM | POA: Diagnosis not present

## 2015-09-13 DIAGNOSIS — R51 Headache: Secondary | ICD-10-CM | POA: Diagnosis not present

## 2015-09-13 DIAGNOSIS — H539 Unspecified visual disturbance: Secondary | ICD-10-CM

## 2015-09-13 DIAGNOSIS — R42 Dizziness and giddiness: Secondary | ICD-10-CM | POA: Diagnosis not present

## 2015-09-21 ENCOUNTER — Encounter: Payer: Self-pay | Admitting: Pain Medicine

## 2015-09-21 ENCOUNTER — Ambulatory Visit: Payer: PPO | Attending: Pain Medicine | Admitting: Pain Medicine

## 2015-09-21 VITALS — BP 133/87 | HR 100 | Temp 97.9°F | Resp 14 | Ht 64.0 in | Wt 199.0 lb

## 2015-09-21 DIAGNOSIS — M792 Neuralgia and neuritis, unspecified: Secondary | ICD-10-CM | POA: Diagnosis not present

## 2015-09-21 DIAGNOSIS — T847XXD Infection and inflammatory reaction due to other internal orthopedic prosthetic devices, implants and grafts, subsequent encounter: Secondary | ICD-10-CM

## 2015-09-21 DIAGNOSIS — M47817 Spondylosis without myelopathy or radiculopathy, lumbosacral region: Secondary | ICD-10-CM | POA: Diagnosis not present

## 2015-09-21 DIAGNOSIS — M4806 Spinal stenosis, lumbar region: Secondary | ICD-10-CM | POA: Insufficient documentation

## 2015-09-21 DIAGNOSIS — R51 Headache: Secondary | ICD-10-CM | POA: Insufficient documentation

## 2015-09-21 DIAGNOSIS — M5030 Other cervical disc degeneration, unspecified cervical region: Secondary | ICD-10-CM | POA: Diagnosis not present

## 2015-09-21 DIAGNOSIS — M797 Fibromyalgia: Secondary | ICD-10-CM | POA: Diagnosis not present

## 2015-09-21 DIAGNOSIS — M5481 Occipital neuralgia: Secondary | ICD-10-CM | POA: Diagnosis not present

## 2015-09-21 DIAGNOSIS — Z9889 Other specified postprocedural states: Secondary | ICD-10-CM | POA: Diagnosis not present

## 2015-09-21 DIAGNOSIS — M5126 Other intervertebral disc displacement, lumbar region: Secondary | ICD-10-CM | POA: Diagnosis not present

## 2015-09-21 DIAGNOSIS — M5137 Other intervertebral disc degeneration, lumbosacral region: Secondary | ICD-10-CM

## 2015-09-21 DIAGNOSIS — E1340 Other specified diabetes mellitus with diabetic neuropathy, unspecified: Secondary | ICD-10-CM | POA: Diagnosis not present

## 2015-09-21 DIAGNOSIS — E1140 Type 2 diabetes mellitus with diabetic neuropathy, unspecified: Secondary | ICD-10-CM | POA: Insufficient documentation

## 2015-09-21 DIAGNOSIS — M5116 Intervertebral disc disorders with radiculopathy, lumbar region: Secondary | ICD-10-CM | POA: Diagnosis not present

## 2015-09-21 DIAGNOSIS — M533 Sacrococcygeal disorders, not elsewhere classified: Secondary | ICD-10-CM | POA: Insufficient documentation

## 2015-09-21 DIAGNOSIS — G43109 Migraine with aura, not intractable, without status migrainosus: Secondary | ICD-10-CM

## 2015-09-21 DIAGNOSIS — M5134 Other intervertebral disc degeneration, thoracic region: Secondary | ICD-10-CM

## 2015-09-21 DIAGNOSIS — M5416 Radiculopathy, lumbar region: Secondary | ICD-10-CM | POA: Diagnosis not present

## 2015-09-21 DIAGNOSIS — M542 Cervicalgia: Secondary | ICD-10-CM | POA: Diagnosis not present

## 2015-09-21 MED ORDER — OXYCODONE HCL 15 MG PO TABS: ORAL_TABLET | ORAL | Status: DC

## 2015-09-21 MED ORDER — TIZANIDINE HCL 4 MG PO TABS: ORAL_TABLET | ORAL | Status: DC

## 2015-09-21 NOTE — Progress Notes (Signed)
Safety precautions to be maintained throughout the outpatient stay will include: orient to surroundings, keep bed in low position, maintain call bell within reach at all times, provide assistance with transfer out of bed and ambulation.  

## 2015-09-21 NOTE — Progress Notes (Signed)
Subjective:    Patient ID: Marissa [name], female    DOB: 1976/05/02, 40 y.o.   MRN: [id number]  HPI  The patient is a 40 year old female who returns to pain management for further evaluation and treatment of pain involving headaches as well as pain involving the neck entire back upper and lower extremity regions. The patient is status post surgical intervention of the cervical and lumbar regions. The patient continues to have significant headaches with pain radiating from the back of the neck continuing forward to the point of the head and the retro-orbital region. There is concern regarding headaches been due to greater occipital neuralgia and migraine headache. We will proceed with interventional treatment consisting of greater occipital nerve blocks at time return appointment pending further evaluation of patient as discussed. The patient is understanding and agreement suggested treatment plan. The patient denies trauma change in events of daily living of significant degree. The patient tolerating Zanaflex and oxycodone without undesirable side effects. Patient agreed to suggested treatment plan to proceed with greater occipital nerve block pending further evaluation as discussed we'll discuss interventional treatment for lumbar lower extremity pain and we'll avoid intraspinal injections at this time as explained to patient. The patient will undergo follow-up surgical evaluation as needed    Review of Systems     Objective:   Physical Exam There was tenderness to palpation of severe degree of the splenius capitis and occipitalis musculature regions. Palpation of these regions reproduced severely disabling pain. There were no new masses of the hip negative noted. There was no excessive tenderness to palpation of the temporal regions. Palpation of the cervical facet cervical paraspinal musculature region was with reproduction of moderate discomfort. There was moderate tenderness to palpation of  the trapezius levator scapula and rhomboid musculature regions. Palpation of the acromioclavicular and glenohumeral joint regions reproduced moderate discomfort as well the patient appeared to be with decreased grip strength with Tinel and Phalen's maneuver reproducing minimal discomfort. There was tenderness over the thoracic facet thoracic paraspinal musculature region with no crepitus of the thoracic region noted. Palpation over the lumbar paraspinal muscular treat and lumbar facet region was with moderate discomfort. Lateral bending rotation extension and palpation of the lumbar facets reproduce moderate discomfort. There was moderate tenderness of the PSIS and PII S region as well as the gluteal and piriformis musculature region with mild tenderness of the greater trochanteric region and iliotibial band region. Straight leg raising was limited to approximately 20 without a definite increased pain with dorsiflexion noted. There was questionably decreased sensation of the L5 dermatomal distribution with negative clonus negative Homans. DTRs difficult to elicit appeared to be trace at the knees. Abdomen nontender with no costovertebral tenderness noted       Assessment & Plan:    Greater occipital neuralgia  Degenerative disc disease of the cervical spine  Status post surgical intervention of the cervical region  Cervical facet syndrome  Degenerative disc disease lumbar spine L5-S1 hardware in good position without complicating features. Extensive postsurgical changes in the posterior soft tissues without obvious abscess. Moderate spurring changes L1 to contributing to right lateral recess and right foraminal stenosis. Bilateral lateral recess stenosis L2-3. Lateral spurring L3-4 with possible impingement of the extraforaminal L3 nerve root. Spinal stenosis and lateral recess stenosis at L4-5. Shallow extraforaminal disc protrusion on the right which could affect the right L4 nerve  root.  Lumbar stenosis  Lumbar facet syndrome  Lumbar radiculopathy  Sacroiliac joint dysfunction  Diabetes mellitus with  diabetic neuropathy  Fibromyalgia     PLAN    Continue present medications. tizanidine and oxycodone  Greater occipital nerve block to be performed at time of return appointment. Please discussed this with Dr.Lithavong as we discussed today   F/U PCP  Dr Richarda Overlie for evaliation of  BP and general medical  condition.   F/U surgical evaluation. Follow-up with Dr. [name] as discussed   Recommend deep myofascial massage/release previously discussed  F/U neurological evaluation. Follow-up neurological evaluation as discussed  May consider radiofrequency rhizolysis or intraspinal procedures pending response to present treatment and F/U evaluation.  We we will avoid such procedures at this time  Patient to call [facility] should patient have concerns prior to scheduled return appointment.

## 2015-09-21 NOTE — Patient Instructions (Addendum)
PLAN    Continue present medications. tizanidine and oxycodone  Greater occipital nerve block to be performed at time of return appointment. Please discussed this with Dr.Lithavong as we discussed today   F/U PCP  Dr Richarda Overlie for evaliation of  BP and general medical  condition.   F/U surgical evaluation. Follow-up with Dr. [name] as discussed   Recommend deep myofascial massage/release previously discussed  F/U neurological evaluation. Follow-up neurological evaluation as discussed  May consider radiofrequency rhizolysis or intraspinal procedures pending response to present treatment and F/U evaluation.  We we will avoid such procedures at this time  Patient to call [facility] should patient have concerns prior to scheduled return appointment.   Occipital Nerve Block Patient Information  Description: The occipital nerves originate in the cervical (neck) spinal cord and travel upward through muscle and tissue to supply sensation to the back of the head and top of the scalp.  In addition, the nerves control some of the muscles of the scalp.  Occipital neuralgia is an irritation of these nerves which can cause headaches, numbness of the scalp, and neck discomfort.     The occipital nerve block will interrupt nerve transmission through these nerves and can relieve pain and spasm.  The block consists of insertion of a small needle under the skin in the back of the head to deposit local anesthetic (numbing medicine) and/or steroids around the nerve.  The entire block usually lasts less than 5 minutes.  Conditions which may be treated by occipital blocks:   Muscular pain and spasm of the scalp  Nerve irritation, back of the head  Headaches  Upper neck pain  Preparation for the injection:  1. Do not eat any solid food or dairy products within 6 hours of your appointment. 2. You may drink clear liquids up to 2 hours before appointment.  Clear liquids include water, black  coffee, juice or soda.  No milk or cream please. 3. You may take your regular medication, including pain medications, with a sip of water before you appointment.  Diabetics should hold regular insulin (if taken separately) and take 1/2 normal NPH dose the morning of the procedure.  Carry some sugar containing items with you to your appointment. 4. A driver must accompany you and be prepared to drive you home after your procedure. 5. Bring all your current medications with you. 6. An IV may be inserted and sedation may be given at the discretion of the physician. 7. A blood pressure cuff, EKG, and other monitors will often be applied during the procedure.  Some patients may need to have extra oxygen administered for a short period. 8. You will be asked to provide medical information, including your allergies and medications, prior to the procedure.  We must know immediately if you are taking blood thinners (like Coumadin/Warfarin) or if you are allergic to IV iodine contrast (dye).  We must know if you could possible be pregnant.  9. Do not wear a high collared shirt or turtleneck.  Tie long hair up in the back if possible.  Possible side-effects:   Bleeding from needle site  Infection (rare, may require surgery)  Nerve injury (rare)  Hair on back of neck can be tinged with iodine scrub (this will wash out)  Light-headedness (temporary)  Pain at injection site (several days)  Decreased blood pressure (rare, temporary)  Seizure (very rare)  Call if you experience:   Hives or difficulty breathing ( go to the emergency room)  Inflammation or drainage at the injection site(s)  Please note:  Although the local anesthetic injected can often make your painful muscles or headache feel good for several hours after the injection, the pain may return.  It takes 3-7 days for steroids to work.  You may not notice any pain relief for at least one week.  If effective, we will often do a series  of injections spaced 3-6 weeks apart to maximally decrease your pain.  If you have any questions, please call [phone] [facility]  A prescription for TIZANIDINE was sent to your pharmacy and should be available for pickup today.  A prescription for OXYCODONE was given to you today.

## 2015-09-30 ENCOUNTER — Telehealth: Payer: Self-pay | Admitting: Pain Medicine

## 2015-09-30 NOTE — Telephone Encounter (Signed)
Dr. Netty Starring will give permission just need form sent to them for him to sign for her to have procedure

## 2015-09-30 NOTE — Telephone Encounter (Signed)
Dr. Primus Bravo, can you give more information about what we need to do?

## 2015-09-30 NOTE — Telephone Encounter (Signed)
Permission sent to Dr. Richarda Overlie.

## 2015-09-30 NOTE — Telephone Encounter (Signed)
Nurses Procedure is greater occipital nerve block with Kenalog (steroid) and 0.25% bupivacaine (local anesthetic)

## 2015-10-06 ENCOUNTER — Ambulatory Visit: Payer: PPO | Attending: Pain Medicine | Admitting: Pain Medicine

## 2015-10-06 ENCOUNTER — Encounter: Payer: Self-pay | Admitting: Pain Medicine

## 2015-10-06 VITALS — BP 188/115 | HR 101 | Temp 97.9°F | Resp 15 | Ht 64.0 in | Wt 198.0 lb

## 2015-10-06 DIAGNOSIS — E1340 Other specified diabetes mellitus with diabetic neuropathy, unspecified: Secondary | ICD-10-CM

## 2015-10-06 DIAGNOSIS — M542 Cervicalgia: Secondary | ICD-10-CM | POA: Diagnosis not present

## 2015-10-06 DIAGNOSIS — M5030 Other cervical disc degeneration, unspecified cervical region: Secondary | ICD-10-CM

## 2015-10-06 DIAGNOSIS — R51 Headache: Secondary | ICD-10-CM | POA: Diagnosis not present

## 2015-10-06 DIAGNOSIS — M5481 Occipital neuralgia: Secondary | ICD-10-CM | POA: Diagnosis not present

## 2015-10-06 DIAGNOSIS — Z9889 Other specified postprocedural states: Secondary | ICD-10-CM | POA: Diagnosis not present

## 2015-10-06 DIAGNOSIS — M5134 Other intervertebral disc degeneration, thoracic region: Secondary | ICD-10-CM

## 2015-10-06 DIAGNOSIS — M5137 Other intervertebral disc degeneration, lumbosacral region: Secondary | ICD-10-CM

## 2015-10-06 DIAGNOSIS — M5126 Other intervertebral disc displacement, lumbar region: Secondary | ICD-10-CM

## 2015-10-06 DIAGNOSIS — G43109 Migraine with aura, not intractable, without status migrainosus: Secondary | ICD-10-CM

## 2015-10-06 MED ORDER — MIDAZOLAM HCL 5 MG/5ML IJ SOLN
5.0000 mg | Freq: Once | INTRAMUSCULAR | Status: DC
Start: 2015-10-06 — End: 2016-02-17

## 2015-10-06 MED ORDER — CEFUROXIME AXETIL 250 MG PO TABS: 250.0000 mg | ORAL_TABLET | Freq: Two times a day (BID) | ORAL | Status: DC

## 2015-10-06 MED ORDER — BUPIVACAINE HCL (PF) 0.25 % IJ SOLN: 30.0000 mL | Freq: Once | INTRAMUSCULAR | Status: DC

## 2015-10-06 MED ORDER — TRIAMCINOLONE ACETONIDE 40 MG/ML IJ SUSP
INTRAMUSCULAR | Status: AC
  Administered 2015-10-06: 12:00:00
  Filled 2015-10-06: qty 1

## 2015-10-06 MED ORDER — ORPHENADRINE CITRATE 30 MG/ML IJ SOLN
INTRAMUSCULAR | Status: AC
  Filled 2015-10-06: qty 2

## 2015-10-06 MED ORDER — MIDAZOLAM HCL 5 MG/5ML IJ SOLN
INTRAMUSCULAR | Status: AC
  Administered 2015-10-06: 12:00:00 via INTRAVENOUS
  Filled 2015-10-06: qty 5

## 2015-10-06 MED ORDER — LACTATED RINGERS IV SOLN: 1000.0000 mL | INTRAVENOUS | Status: DC

## 2015-10-06 MED ORDER — CEFAZOLIN SODIUM 1 G IJ SOLR
INTRAMUSCULAR | Status: AC
  Administered 2015-10-06: 1 g via INTRAVENOUS
  Filled 2015-10-06: qty 10

## 2015-10-06 MED ORDER — BUPIVACAINE HCL (PF) 0.25 % IJ SOLN
INTRAMUSCULAR | Status: AC
  Administered 2015-10-06: 12:00:00
  Filled 2015-10-06: qty 30

## 2015-10-06 MED ORDER — CEFAZOLIN SODIUM 1-5 GM-% IV SOLN: 1.0000 g | Freq: Once | INTRAVENOUS | Status: DC

## 2015-10-06 MED ORDER — FENTANYL CITRATE (PF) 100 MCG/2ML IJ SOLN: 100.0000 ug | Freq: Once | INTRAMUSCULAR | Status: DC

## 2015-10-06 MED ORDER — FENTANYL CITRATE (PF) 100 MCG/2ML IJ SOLN
INTRAMUSCULAR | Status: AC
  Administered 2015-10-06: 50 ug via INTRAVENOUS
  Filled 2015-10-06: qty 2

## 2015-10-06 MED ORDER — TRIAMCINOLONE ACETONIDE 40 MG/ML IJ SUSP: 40.0000 mg | Freq: Once | INTRAMUSCULAR | Status: DC

## 2015-10-06 MED ORDER — ORPHENADRINE CITRATE 30 MG/ML IJ SOLN: 60.0000 mg | Freq: Once | INTRAMUSCULAR | Status: DC

## 2015-10-06 NOTE — Patient Instructions (Addendum)
PLAN    Continue present medications. tizanidine and oxycodone and begin Ceftin antibiotic as prescribed  F/U PCP  Dr Richarda Overlie for evaliation of  BP and general medical  condition.   F/U surgical evaluation. Follow-up with Dr. [name] as discussed   Recommend deep myofascial massage/release previously discussed  F/U neurological evaluation. Follow-up neurological evaluation as discussed  May consider radiofrequency rhizolysis or intraspinal procedures pending response to present treatment and F/U evaluation.  We we will avoid such procedures at this time  Patient to call [facility] should patient have concerns prior to scheduled return appointment. GENERAL RISKS AND COMPLICATIONS  What are the risk, side effects and possible complications? Generally speaking, most procedures are safe.  However, with any procedure there are risks, side effects, and the possibility of complications.  The risks and complications are dependent upon the sites that are lesioned, or the type of nerve block to be performed.  The closer the procedure is to the spine, the more serious the risks are.  Great care is taken when placing the radio frequency needles, block needles or lesioning probes, but sometimes complications can occur. 1. Infection: Any time there is an injection through the skin, there is a risk of infection.  This is why sterile conditions are used for these blocks.  There are four possible types of infection. 1. Localized skin infection. 2. Central Nervous System Infection-This can be in the form of Meningitis, which can be deadly. 3. Epidural Infections-This can be in the form of an epidural abscess, which can cause pressure inside of the spine, causing compression of the spinal cord with subsequent paralysis. This would require an emergency surgery to decompress, and there are no guarantees that the patient would recover from the paralysis. 4. Discitis-This is an infection of the  intervertebral discs.  It occurs in about 1% of discography procedures.  It is difficult to treat and it may lead to surgery.        2. Pain: the needles have to go through skin and soft tissues, will cause soreness.       3. Damage to internal structures:  The nerves to be lesioned may be near blood vessels or    other nerves which can be potentially damaged.       4. Bleeding: Bleeding is more common if the patient is taking blood thinners such as  aspirin, Coumadin, Ticiid, Plavix, etc., or if he/she have some genetic predisposition  such as hemophilia. Bleeding into the spinal canal can cause compression of the spinal  cord with subsequent paralysis.  This would require an emergency surgery to  decompress and there are no guarantees that the patient would recover from the  paralysis.       5. Pneumothorax:  Puncturing of a lung is a possibility, every time a needle is introduced in  the area of the chest or upper back.  Pneumothorax refers to free air around the  collapsed lung(s), inside of the thoracic cavity (chest cavity).  Another two possible  complications related to a similar event would include: Hemothorax and Chylothorax.   These are variations of the Pneumothorax, where instead of air around the collapsed  lung(s), you may have blood or chyle, respectively.       6. Spinal headaches: They may occur with any procedures in the area of the spine.       7. Persistent CSF (Cerebro-Spinal Fluid) leakage: This is a rare problem, but may occur  with prolonged intrathecal  or epidural catheters either due to the formation of a fistulous  track or a dural tear.       8. Nerve damage: By working so close to the spinal cord, there is always a possibility of  nerve damage, which could be as serious as a permanent spinal cord injury with  paralysis.       9. Death:  Although rare, severe deadly allergic reactions known as "Anaphylactic  reaction" can occur to any of the medications used.       10. Worsening of the symptoms:  We can always make thing worse.  What are the chances of something like this happening? Chances of any of this occuring are extremely low.  By statistics, you have more of a chance of getting killed in a motor vehicle accident: while driving to the hospital than any of the above occurring .  Nevertheless, you should be aware that they are possibilities.  In general, it is similar to taking a shower.  Everybody knows that you can slip, hit your head and get killed.  Does that mean that you should not shower again?  Nevertheless always keep in mind that statistics do not mean anything if you happen to be on the wrong side of them.  Even if a procedure has a 1 (one) in a 1,000,000 (million) chance of going wrong, it you happen to be that one..Also, keep in mind that by statistics, you have more of a chance of having something go wrong when taking medications.  Who should not have this procedure? If you are on a blood thinning medication (e.g. Coumadin, Plavix, see list of "Blood Thinners"), or if you have an active infection going on, you should not have the procedure.  If you are taking any blood thinners, please inform your physician.  How should I prepare for this procedure?  Do not eat or drink anything at least six hours prior to the procedure.  Bring a driver with you .  It cannot be a taxi.  Come accompanied by an adult that can drive you back, and that is strong enough to help you if your legs get weak or numb from the local anesthetic.  Take all of your medicines the morning of the procedure with just enough water to swallow them.  If you have diabetes, make sure that you are scheduled to have your procedure done first thing in the morning, whenever possible.  If you have diabetes, take only half of your insulin dose and notify our nurse that you have done so as soon as you arrive at the clinic.  If you are diabetic, but only take blood sugar pills (oral  hypoglycemic), then do not take them on the morning of your procedure.  You may take them after you have had the procedure.  Do not take aspirin or any aspirin-containing medications, at least eleven (11) days prior to the procedure.  They may prolong bleeding.  Wear loose fitting clothing that may be easy to take off and that you would not mind if it got stained with Betadine or blood.  Do not wear any jewelry or perfume  Remove any nail coloring.  It will interfere with some of our monitoring equipment.  NOTE: Remember that this is not meant to be interpreted as a complete list of all possible complications.  Unforeseen problems may occur.  BLOOD THINNERS The following drugs contain aspirin or other products, which can cause increased bleeding during surgery and should not  be taken for 2 weeks prior to and 1 week after surgery.  If you should need take something for relief of minor pain, you may take acetaminophen which is found in Tylenol,m Datril, Anacin-3 and Panadol. It is not blood thinner. The products listed below are.  Do not take any of the products listed below in addition to any listed on your instruction sheet.  A.P.C or A.P.C with Codeine Codeine Phosphate Capsules #3 Ibuprofen Ridaura  ABC compound Congesprin Imuran rimadil  Advil Cope Indocin Robaxisal  Alka-Seltzer Effervescent Pain Reliever and Antacid Coricidin or Coricidin-D  Indomethacin Rufen  Alka-Seltzer plus Cold Medicine Cosprin Ketoprofen S-A-C Tablets  Anacin Analgesic Tablets or Capsules Coumadin Korlgesic Salflex  Anacin Extra Strength Analgesic tablets or capsules CP-2 Tablets Lanoril Salicylate  Anaprox Cuprimine Capsules Levenox Salocol  Anexsia-D Dalteparin Magan Salsalate  Anodynos Darvon compound Magnesium Salicylate Sine-off  Ansaid Dasin Capsules Magsal Sodium Salicylate  Anturane Depen Capsules Marnal Soma  APF Arthritis pain formula Dewitt's Pills Measurin Stanback  Argesic Dia-Gesic Meclofenamic  Sulfinpyrazone  Arthritis Bayer Timed Release Aspirin Diclofenac Meclomen Sulindac  Arthritis pain formula Anacin Dicumarol Medipren Supac  Analgesic (Safety coated) Arthralgen Diffunasal Mefanamic Suprofen  Arthritis Strength Bufferin Dihydrocodeine Mepro Compound Suprol  Arthropan liquid Dopirydamole Methcarbomol with Aspirin Synalgos  ASA tablets/Enseals Disalcid Micrainin Tagament  Ascriptin Doan's Midol Talwin  Ascriptin A/D Dolene Mobidin Tanderil  Ascriptin Extra Strength Dolobid Moblgesic Ticlid  Ascriptin with Codeine Doloprin or Doloprin with Codeine Momentum Tolectin  Asperbuf Duoprin Mono-gesic Trendar  Aspergum Duradyne Motrin or Motrin IB Triminicin  Aspirin plain, buffered or enteric coated Durasal Myochrisine Trigesic  Aspirin Suppositories Easprin Nalfon Trillsate  Aspirin with Codeine Ecotrin Regular or Extra Strength Naprosyn Uracel  Atromid-S Efficin Naproxen Ursinus  Auranofin Capsules Elmiron Neocylate Vanquish  Axotal Emagrin Norgesic Verin  Azathioprine Empirin or Empirin with Codeine Normiflo Vitamin E  Azolid Emprazil Nuprin Voltaren  Bayer Aspirin plain, buffered or children's or timed BC Tablets or powders Encaprin Orgaran Warfarin Sodium  Buff-a-Comp Enoxaparin Orudis Zorpin  Buff-a-Comp with Codeine Equegesic Os-Cal-Gesic   Buffaprin Excedrin plain, buffered or Extra Strength Oxalid   Bufferin Arthritis Strength Feldene Oxphenbutazone   Bufferin plain or Extra Strength Feldene Capsules Oxycodone with Aspirin   Bufferin with Codeine Fenoprofen Fenoprofen Pabalate or Pabalate-SF   Buffets II Flogesic Panagesic   Buffinol plain or Extra Strength Florinal or Florinal with Codeine Panwarfarin   Buf-Tabs Flurbiprofen Penicillamine   Butalbital Compound Four-way cold tablets Penicillin   Butazolidin Fragmin Pepto-Bismol   Carbenicillin Geminisyn Percodan   Carna Arthritis Reliever Geopen Persantine   Carprofen Gold's salt Persistin   Chloramphenicol Goody's  Phenylbutazone   Chloromycetin Haltrain Piroxlcam   Clmetidine heparin Plaquenil   Cllnoril Hyco-pap Ponstel   Clofibrate Hydroxy chloroquine Propoxyphen         Before stopping any of these medications, be sure to consult the physician who ordered them.  Some, such as Coumadin (Warfarin) are ordered to prevent or treat serious conditions such as "deep thrombosis", "pumonary embolisms", and other heart problems.  The amount of time that you may need off of the medication may also vary with the medication and the reason for which you were taking it.  If you are taking any of these medications, please make sure you notify your pain physician before you undergo any procedures.

## 2015-10-06 NOTE — Progress Notes (Signed)
Safety precautions to be maintained throughout the outpatient stay will include: orient to surroundings, keep bed in low position, maintain call bell within reach at all times, provide assistance with transfer out of bed and ambulation.  

## 2015-10-06 NOTE — Progress Notes (Signed)
   Subjective:    Patient ID: Marissa [name], female    DOB: 1976-07-27, 40 y.o.   MRN: [id number]  HPI  NOTE: The patient is a 40 y.o.-year-old female who [facility] the [facility] for further evaluation and treatment of pain consisting of pain involving the region of the neck and headache.  The  patient is status post surgical intervention of the cervical region as well as the lumbar region with pain radiating from the back of the neck towards the back of the head producing headache. The patient is felt to be with significant component of headaches due to greater occipital neuralgia.  The risks, benefits, and expectations of the procedure have been discussed and explained to patient, who is understanding and wishes to proceed with interventional treatment as discussed and as explained to patient.  Will proceed with greater occipital nerve blocks with myoneural block injections at this time as discussed and as explained to patient.  All are understanding and in agreement with suggested treatment plan.    PROCEDURE:  Greater occipital nerve block on the left side with IV Versed, IV Fentanyl, conscious sedation, EKG, blood pressure, pulse, pulse oximetry monitoring.  Procedure performed with patient in prone position.  Greater occipital nerve block on the left side.   With patient in prone position, Betadine prep of proposed entry site accomplished.  Following identification of the nuchal ridge, 22 -gauge needle was inserted at the level of the nuchal ridge medial to the occipital artery.  Following negative aspiration, 4cc 0.25% bupivacaine with Kenalog injected for left greater occipital nerve block.  Needle was removed.  Patient tolerated injection well.   Greater occipital nerve block on the rightt side. The greater occipital nerve block on the right side was performed exactly as the left greater occipital nerve block was performed and utilizing the same technique.  Myoneural block  injection of the gluteal musculature region Following Betadine prep of proposed entry site a 22-gauge needle was inserted in the gluteal musculature region and following negative aspiration 2 cc of 0.25% bupivacaine with Norflex was injected for myoneural block injection of the gluteal musculature region 1   A total of 10 mg Kenalog was utilized for the entire procedure.  PLAN:    1. Medications: Will continue presently prescribed medication oxycodone at this time. 2. Patient to follow up with primary care physician Dr. Richarda Overlie  for evaluation of blood pressure  diabetes mellitus and general medical condition status post procedure performed on today's visit.The patient will also follow up with her cardiologist as discussed status post procedure performed today  3. Neurological evaluation for further assessment of headaches for further studies as discussed. 4. Surgical evaluation as discussed. Patient will follow-up with Dr. [name]  5. Patient may be candidate for Botox injections, radiofrequency procedures, as well as implantation type procedures pending response to treatment rendered on today's visit and pending follow-up evaluation. 6. Patient has been advised to adhere to proper body mechanics and to avoid activities which appear to aggravate condition.cations:  Will continue presently prescribed medications at this time. 7. The patient is understanding and in agreement with the suggested treatment plan.   Review of Systems     Objective:   Physical Exam        Assessment & Plan:

## 2015-10-07 ENCOUNTER — Telehealth: Payer: Self-pay | Admitting: *Deleted

## 2015-10-07 NOTE — Telephone Encounter (Signed)
Spoke with patient and she verbalizes no complications from procedure on yesterday.  However, she states that her IV was not working well when placed and then the "lady" in the procedure stepped on it and it came out a little and she is concerned that she did not get any sedation.  States she felt everything during procedure and it is not normally like that.  I apologized to patient and told her the time to let us know this would have been during the procedure.  She states that she did tell Dr Primus Bravo and his response was that "he had ordered the sedation.  I told the patient to please remind [place] next time so that we can insure a proper working IV and proper sedation during procedure.

## 2015-10-08 ENCOUNTER — Other Ambulatory Visit: Payer: Self-pay | Admitting: Pain Medicine

## 2015-10-18 DIAGNOSIS — F329 Major depressive disorder, single episode, unspecified: Secondary | ICD-10-CM | POA: Diagnosis not present

## 2015-10-18 DIAGNOSIS — E119 Type 2 diabetes mellitus without complications: Secondary | ICD-10-CM | POA: Diagnosis not present

## 2015-10-18 DIAGNOSIS — R600 Localized edema: Secondary | ICD-10-CM | POA: Diagnosis not present

## 2015-10-18 DIAGNOSIS — I2510 Atherosclerotic heart disease of native coronary artery without angina pectoris: Secondary | ICD-10-CM | POA: Diagnosis not present

## 2015-10-18 DIAGNOSIS — M545 Low back pain: Secondary | ICD-10-CM | POA: Diagnosis not present

## 2015-10-18 DIAGNOSIS — I429 Cardiomyopathy, unspecified: Secondary | ICD-10-CM | POA: Diagnosis not present

## 2015-10-18 DIAGNOSIS — R51 Headache: Secondary | ICD-10-CM | POA: Diagnosis not present

## 2015-10-18 DIAGNOSIS — I428 Other cardiomyopathies: Secondary | ICD-10-CM | POA: Diagnosis not present

## 2015-10-18 DIAGNOSIS — E784 Other hyperlipidemia: Secondary | ICD-10-CM | POA: Diagnosis not present

## 2015-10-18 DIAGNOSIS — R0602 Shortness of breath: Secondary | ICD-10-CM | POA: Diagnosis not present

## 2015-10-18 DIAGNOSIS — I10 Essential (primary) hypertension: Secondary | ICD-10-CM | POA: Diagnosis not present

## 2015-10-18 DIAGNOSIS — I509 Heart failure, unspecified: Secondary | ICD-10-CM | POA: Diagnosis not present

## 2015-10-19 ENCOUNTER — Encounter: Payer: Self-pay | Admitting: Pain Medicine

## 2015-10-19 ENCOUNTER — Ambulatory Visit: Payer: PPO | Attending: Pain Medicine | Admitting: Pain Medicine

## 2015-10-19 VITALS — BP 127/83 | HR 98 | Temp 96.3°F | Resp 16 | Ht 64.0 in | Wt 210.0 lb

## 2015-10-19 DIAGNOSIS — M5116 Intervertebral disc disorders with radiculopathy, lumbar region: Secondary | ICD-10-CM | POA: Insufficient documentation

## 2015-10-19 DIAGNOSIS — M5481 Occipital neuralgia: Secondary | ICD-10-CM | POA: Diagnosis not present

## 2015-10-19 DIAGNOSIS — M5134 Other intervertebral disc degeneration, thoracic region: Secondary | ICD-10-CM

## 2015-10-19 DIAGNOSIS — M542 Cervicalgia: Secondary | ICD-10-CM | POA: Diagnosis not present

## 2015-10-19 DIAGNOSIS — E1340 Other specified diabetes mellitus with diabetic neuropathy, unspecified: Secondary | ICD-10-CM

## 2015-10-19 DIAGNOSIS — M5030 Other cervical disc degeneration, unspecified cervical region: Secondary | ICD-10-CM

## 2015-10-19 DIAGNOSIS — M47817 Spondylosis without myelopathy or radiculopathy, lumbosacral region: Secondary | ICD-10-CM | POA: Diagnosis not present

## 2015-10-19 DIAGNOSIS — Z9889 Other specified postprocedural states: Secondary | ICD-10-CM | POA: Diagnosis not present

## 2015-10-19 DIAGNOSIS — G43909 Migraine, unspecified, not intractable, without status migrainosus: Secondary | ICD-10-CM | POA: Diagnosis not present

## 2015-10-19 DIAGNOSIS — R51 Headache: Secondary | ICD-10-CM | POA: Insufficient documentation

## 2015-10-19 DIAGNOSIS — G43109 Migraine with aura, not intractable, without status migrainosus: Secondary | ICD-10-CM

## 2015-10-19 DIAGNOSIS — M4806 Spinal stenosis, lumbar region: Secondary | ICD-10-CM | POA: Diagnosis not present

## 2015-10-19 DIAGNOSIS — M5416 Radiculopathy, lumbar region: Secondary | ICD-10-CM | POA: Diagnosis not present

## 2015-10-19 DIAGNOSIS — M5137 Other intervertebral disc degeneration, lumbosacral region: Secondary | ICD-10-CM

## 2015-10-19 DIAGNOSIS — M533 Sacrococcygeal disorders, not elsewhere classified: Secondary | ICD-10-CM | POA: Diagnosis not present

## 2015-10-19 DIAGNOSIS — M5126 Other intervertebral disc displacement, lumbar region: Secondary | ICD-10-CM

## 2015-10-19 MED ORDER — OXYCODONE HCL 15 MG PO TABS: ORAL_TABLET | ORAL | Status: DC

## 2015-10-19 MED ORDER — TIZANIDINE HCL 4 MG PO TABS: ORAL_TABLET | ORAL | Status: DC

## 2015-10-19 NOTE — Patient Instructions (Addendum)
PLAN    Continue present medications. tizanidine and oxycodone  F/U PCP  Dr Richarda Overlie for evaliation of  BP and general medical  condition.   F/U surgical evaluation. Follow-up with Dr. [name] as discussed   F/U Dr Clayborn Bigness as planned  Recommend deep myofascial massage/release 2 times per week with caution to avoid aggravation of symptoms   F/U neurological evaluation. Follow-up neurological evaluation as discussed  May consider radiofrequency rhizolysis or intraspinal procedures pending response to present treatment and F/U evaluation.  Patient to call Pain Management Center should patient have concerns prior to scheduled return appointment. Pain Management Discharge Instructions  General Discharge Instructions :  If you need to reach your doctor call: Monday-Friday 8:00 am - 4:00 pm at [phone] or toll free [phone].  After clinic hours [phone] to have operator reach doctor.  Bring all of your medication bottles to all your appointments in the pain clinic.  To cancel or reschedule your appointment with Pain Management please remember to call 24 hours in advance to avoid a fee.  Refer to the educational materials which you have been given on: General Risks, I had my Procedure. Discharge Instructions, Post Sedation.  Post Procedure Instructions:  The drugs you were given will stay in your system until tomorrow, so for the next 24 hours you should not drive, make any legal decisions or drink any alcoholic beverages.  You may eat anything you prefer, but it is better to start with liquids then soups and crackers, and gradually work up to solid foods.  Please notify your doctor immediately if you have any unusual bleeding, trouble breathing or pain that is not related to your normal pain.  Depending on the type of procedure that was done, some parts of your body may feel week and/or numb.  This usually clears up by tonight or the next day.  Walk with the use of an  assistive device or accompanied by an adult for the 24 hours.  You may use ice on the affected area for the first 24 hours.  Put ice in a Ziploc bag and cover with a towel and place against area 15 minutes on 15 minutes off.  You may switch to heat after 24 hours.

## 2015-10-19 NOTE — Progress Notes (Signed)
Safety precautions to be maintained throughout the outpatient stay will include: orient to surroundings, keep bed in low position, maintain call bell within reach at all times, provide assistance with transfer out of bed and ambulation.  

## 2015-10-19 NOTE — Progress Notes (Signed)
Subjective:    Patient ID: Marissa [name], female    DOB: 03-15-1976, 40 y.o.   MRN: [id number]  HPI  The patient is a 40 year old female who returns to pain management for further evaluation and treatment of pain involving the region of the neck with pain of the neck radiating to the back of the head causing headaches. The patient is status post greater occipital nerve block with improvement of her headache pattern. We have discussed additional interventional treatments and May consider patient for additional interventional treatment pending follow-up evaluation. We have also discussed patient undergoing sphenopalatine ganglion block. The patient has been felt to be with headaches due to greater occipital neuralgia as well as component of migraine headache. The patient denies any trauma change in events of daily living the call significant change in symptomatology. He Zanaflex and oxycodone and we'll consider additional modification of treatment regimen pending follow-up evaluation. The patient agreed to suggested treatment plan    Review of Systems     Objective:   Physical Exam  There was tenderness of the splenius capitis and occipitalis musculature region a mild degree. No masses of the head and neck were noted. No bounding pulsations of the temporal region were noted. There was well-healed surgical scar of the cervical region without increased warmth or erythema in the region of the scar. There was severely limited range of motion of the cervical spine with forward appeared to be unremarkable Spurling's maneuver.. The patient was with decreased grip strength and Tinel and Phalen's maneuver were without increase of pain of significant degree. Palpation of the thoracic region was attends to palpation with moderate muscle spasms noted in the thoracic region with tenderness of the levator scapula rhomboid trapezius musculature regions with moderate muscle spasms involving these regions. There  was tenderness over the lumbar facet lumbar paraspinal musculature region with increased pain of moderate to moderately severe degree with lateral bending rotation extension and palpation over the lumbar facet region. Straight leg raising was tolerates approximately 20 with questionably increased pain with dorsiflexion noted. EHL strength was decreased. DTRs were difficult to elicit at the knees. There was questionably decreased sensation of the L5 dermatomal distribution. Palpation of the PSIS and PII S region reproduced pain of moderate degree. There was moderate tenderness of the greater trochanteric region and iliotibial band region. There was negative clonus negative Homans The abdomen was nontender with no costovertebral angle tenderness noted      Assessment & Plan:       Greater occipital neuralgia  Migraine  Degenerative disc disease of the cervical spine  Status post surgical intervention of the cervical region  Cervical facet syndrome  Degenerative disc disease lumbar spine L5-S1 hardware in good position without complicating features. Extensive postsurgical changes in the posterior soft tissues without obvious abscess. Moderate spurring changes L1 to contributing to right lateral recess and right foraminal stenosis. Bilateral lateral recess stenosis L2-3. Lateral spurring L3-4 with possible impingement of the extraforaminal L3 nerve root. Spinal stenosis and lateral recess stenosis at L4-5. Shallow extraforaminal disc protrusion on the right which could affect the right L4 nerve root  .Lumbar stenosis  Lumbar facet syndrome  Lumbar radiculopathy     PLAN    Continue present medications. tizanidine and oxycodone  F/U PCP  Dr Richarda Overlie for evaliation of  BP and general medical  condition.   F/U surgical evaluation. Follow-up with Dr. [name] as discussed   F/U Dr Clayborn Bigness as planned  Recommend deep myofascial massage/release 2  times per week with caution to avoid  aggravation of symptoms   F/U neurological evaluation. Follow-up neurological evaluation as discussed  May consider radiofrequency rhizolysis or intraspinal procedures pending response to present treatment and F/U evaluation.  Patient to call Pain Management Center should patient have concerns prior to scheduled return appointment.

## 2015-11-03 ENCOUNTER — Emergency Department: Payer: PPO

## 2015-11-03 ENCOUNTER — Telehealth: Payer: Self-pay | Admitting: Emergency Medicine

## 2015-11-03 ENCOUNTER — Encounter: Payer: Self-pay | Admitting: Emergency Medicine

## 2015-11-03 ENCOUNTER — Emergency Department
Admission: EM | Admit: 2015-11-03 | Discharge: 2015-11-03 | Disposition: A | Discharge status: Home / Self Care | Payer: PPO | Attending: Emergency Medicine | Admitting: Emergency Medicine

## 2015-11-03 DIAGNOSIS — I110 Hypertensive heart disease with heart failure: Secondary | ICD-10-CM | POA: Insufficient documentation

## 2015-11-03 DIAGNOSIS — M542 Cervicalgia: Secondary | ICD-10-CM | POA: Insufficient documentation

## 2015-11-03 DIAGNOSIS — G43909 Migraine, unspecified, not intractable, without status migrainosus: Secondary | ICD-10-CM | POA: Diagnosis not present

## 2015-11-03 DIAGNOSIS — I213 ST elevation (STEMI) myocardial infarction of unspecified site: Secondary | ICD-10-CM | POA: Insufficient documentation

## 2015-11-03 DIAGNOSIS — Z794 Long term (current) use of insulin: Secondary | ICD-10-CM | POA: Diagnosis not present

## 2015-11-03 DIAGNOSIS — I509 Heart failure, unspecified: Secondary | ICD-10-CM | POA: Diagnosis not present

## 2015-11-03 DIAGNOSIS — M5412 Radiculopathy, cervical region: Secondary | ICD-10-CM | POA: Diagnosis not present

## 2015-11-03 DIAGNOSIS — E119 Type 2 diabetes mellitus without complications: Secondary | ICD-10-CM | POA: Insufficient documentation

## 2015-11-03 DIAGNOSIS — F329 Major depressive disorder, single episode, unspecified: Secondary | ICD-10-CM | POA: Insufficient documentation

## 2015-11-03 DIAGNOSIS — E785 Hyperlipidemia, unspecified: Secondary | ICD-10-CM | POA: Diagnosis not present

## 2015-11-03 DIAGNOSIS — M5410 Radiculopathy, site unspecified: Secondary | ICD-10-CM

## 2015-11-03 DIAGNOSIS — I2510 Atherosclerotic heart disease of native coronary artery without angina pectoris: Secondary | ICD-10-CM | POA: Diagnosis not present

## 2015-11-03 DIAGNOSIS — E7800 Pure hypercholesterolemia, unspecified: Secondary | ICD-10-CM | POA: Insufficient documentation

## 2015-11-03 DIAGNOSIS — Z9641 Presence of insulin pump (external) (internal): Secondary | ICD-10-CM | POA: Insufficient documentation

## 2015-11-03 DIAGNOSIS — M4322 Fusion of spine, cervical region: Secondary | ICD-10-CM | POA: Diagnosis not present

## 2015-11-03 DIAGNOSIS — M1990 Unspecified osteoarthritis, unspecified site: Secondary | ICD-10-CM | POA: Diagnosis not present

## 2015-11-03 DIAGNOSIS — M5414 Radiculopathy, thoracic region: Secondary | ICD-10-CM | POA: Diagnosis not present

## 2015-11-03 DIAGNOSIS — M79601 Pain in right arm: Secondary | ICD-10-CM | POA: Diagnosis not present

## 2015-11-03 DIAGNOSIS — Z79899 Other long term (current) drug therapy: Secondary | ICD-10-CM | POA: Insufficient documentation

## 2015-11-03 MED ORDER — ONDANSETRON 4 MG PO TBDP
4.0000 mg | ORAL_TABLET | Freq: Once | ORAL | Status: AC
  Administered 2015-11-03: 4 mg via ORAL
  Filled 2015-11-03: qty 1

## 2015-11-03 MED ORDER — OXYCODONE-ACETAMINOPHEN 5-325 MG PO TABS
1.0000 | ORAL_TABLET | Freq: Once | ORAL | Status: AC
  Administered 2015-11-03: 1 via ORAL
  Filled 2015-11-03: qty 1

## 2015-11-03 MED ORDER — OXYCODONE-ACETAMINOPHEN 5-325 MG PO TABS: 1.0000 | ORAL_TABLET | Freq: Once | ORAL | Status: DC

## 2015-11-03 NOTE — ED Notes (Signed)
Pt returned from MRI °

## 2015-11-03 NOTE — ED Notes (Signed)
Report received, pt waiting on MRI results

## 2015-11-03 NOTE — ED Notes (Addendum)
Pt presents to ED c/o shoulder pain radiating down to right hand for the past couple of days. Pt reports has been working out at [employer] and believes pain might be from working out. Right shoulder and neck tender to touch. Pt has hx of rotator cuff surgery.Pain increases with movement. (+) bilateral radial pulses. Pt denies known injury to shoulder. No obvious swelling or deformity noted. Pt alert and oriented x 4, no increased work in breathing noted. MD at bedside.

## 2015-11-03 NOTE — ED Notes (Signed)
Patient transported to MRI via stretcher.

## 2015-11-03 NOTE — ED Provider Notes (Signed)
[facility] Emergency Department Provider Note  ____________________________________________  Time seen: 5:30 AM  I have reviewed the triage vital signs and the nursing notes.   HISTORY  Chief Complaint Extremity Pain      HPI Marissa [name] is a 40 y.o. female with history of cervical discectomy in the past presents with acute and progressive posterior neck pain with radiation into the right arm extending to forearm and hand. Patient states that her current pain score is 9 out of 10. Patient admits to a recent exercise workout routine. She states that pain is worse with movement and alleviated with immobility. In addition the patient admits to right arm numbness.     Past Medical History  Diagnosis Date  . Hypertension   . Coronary artery disease   . Neuropathy ([place])   . Headache(784.0)   . Arthritis   . Vision loss     due to diabetes  . Myocardial infarction [facility]) 2006    "due to medication"; no evidence of ischemia or infarction by nuclear stress test '11  . Dysrhythmia     tachycardia  . Diabetes mellitus without complication (HCC)     fasting cbg 50-140s  . GERD (gastroesophageal reflux disease)     otc meds  . Migraines     once/month maybe - can last up to two weeks  . Hyperlipemia   . Cardiomyopathy, dilated, nonischemic ([place])   . MI, old     2006  . Neck pain, chronic   . Back pain, chronic   . Depression   . Anxiety   . Hypercholesteremia   . Allergy   . Anemia   . Prolonged QT interval syndrome   . Cholelithiasis   . Pneumonia 2015    [facility]  . Sleep apnea     does not use cpap since losing alot of weight  . CHF (congestive heart failure) ([place])   . Renal insufficiency   . Insulin pump in place     Patient Active Problem List   Diagnosis Date Noted  . Cardiomyopathy ([place]) 04/23/2015  . Long term current use of antibiotics 04/23/2015  . Biliary colic 19/41/7408  . Calculus of gallbladder with acute cholecystitis  04/07/2015  . Bilateral occipital neuralgia 01/21/2015  . Dizziness 01/04/2015  . Headache 01/04/2015  . DDD (degenerative disc disease), cervical 12/18/2014  . DDD (degenerative disc disease), thoracic 12/18/2014  . DDD (degenerative disc disease), lumbosacral 12/18/2014  . Neuropathy due to secondary diabetes ([place]) 12/18/2014  . Migraine 12/18/2014  . Hypercholesterolemia without hypertriglyceridemia 08/28/2014  . HPV test positive 07/17/2014  . Class 2 obesity 04/15/2014  . CAD in native artery 04/15/2014  . Breathlessness on exertion 03/24/2014  . Wound infection complicating hardware ([place]) 02/25/2014  . Nausea alone 02/01/2014  . Abdominal pain, unspecified site 02/01/2014  . Severe sepsis(995.92) 01/30/2014  . History of lumbar laminectomy 01/30/2014  . Acute renal failure ([place]) 01/30/2014  . Anemia 01/30/2014  . HTN (hypertension) 01/30/2014  . DM (diabetes mellitus), type 2 with renal complications ([place]) 14/48/1856  . Wound infection ([place]) 01/25/2014  . Acute respiratory failure with hypoxia ([place]) 01/25/2014  . Type 2 diabetes mellitus treated with insulin ([place]) 01/13/2014  . Essential (primary) hypertension 01/13/2014  . Difficulty speaking 01/13/2014  . Clinical depression 01/13/2014  . Back pain 11/01/2013  . HNP (herniated nucleus pulposus), lumbar 10/29/2013    Past Surgical History  Procedure Laterality Date  . Back surgery  2011    Lumbar   .  Cervical fusion  2006  . Salpingoophorectomy Bilateral     1 1997. 2nd 2001  . Cardiac catheterization  07/18/2004    50-60% mid LAD, minor luminal irregularities RCA, normal LM and CX, EF 50-55% [facility])  . Lumbar laminectomy/decompression microdiscectomy Right 05/07/2013    Procedure: Right Lumbar one-two laminectomy;  Surgeon: Elaina Hoops, MD;  Location: [place] NEURO ORS;  Service: Neurosurgery;  Laterality: Right;  . Carpel tunnel Bilateral   . Lumbar laminectomy/decompression microdiscectomy Left  10/29/2013    Procedure: Left Lumbar five-Sacral one Laminectomy;  Surgeon: Elaina Hoops, MD;  Location: [place] NEURO ORS;  Service: Neurosurgery;  Laterality: Left;  . Lumbar wound debridement N/A 01/25/2014    Procedure: LUMBAR WOUND DEBRIDEMENT;  Surgeon: Charlie Pitter, MD;  Location: [place] NEURO ORS;  Service: Neurosurgery;  Laterality: N/A;  . Lumbar wound debridement N/A 02/25/2014    Procedure: LUMBAR WOUND DEBRIDEMENT;  Surgeon: Elaina Hoops, MD;  Location: [place] NEURO ORS;  Service: Neurosurgery;  Laterality: N/A;  . Cholecystectomy N/A 04/08/2015    Procedure: LAPAROSCOPIC CHOLECYSTECTOMY WITH INTRAOPERATIVE CHOLANGIOGRAM;  Surgeon: Florene Glen, MD;  Location: [facility] ORS;  Service: General;  Laterality: N/A;    Current Outpatient Rx  Name  Route  Sig  Dispense  Refill  . amitriptyline (ELAVIL) 100 MG tablet   Oral   Take 100 mg by mouth at bedtime.          . carvedilol (COREG) 25 MG tablet   Oral   Take 1 tablet (25 mg total) by mouth 2 (two) times daily with a meal.   60 tablet   2   . cefUROXime (CEFTIN) 250 MG tablet   Oral   Take 1 tablet (250 mg total) by mouth 2 (two) times daily with a meal. Patient not taking: Reported on 10/19/2015   14 tablet   0   . furosemide (LASIX) 20 MG tablet   Oral   Take 1 tablet (20 mg total) by mouth 2 (two) times daily.   30 tablet   0   . gabapentin (NEURONTIN) 300 MG capsule   Oral   Take 600 mg by mouth 3 (three) times daily.          . insulin aspart (NOVOLOG FLEXPEN) 100 UNIT/ML FlexPen   Subcutaneous   Inject 10-20 Units into the skin 3 (three) times daily with meals. Sliding scale Patient not taking: Reported on 09/21/2015   15 mL   11   . Insulin Disposable Pump (V-GO 20) KIT   Does not apply   by Does not apply route. Pt unsure of dosage to bring pen.         . insulin glargine (LANTUS) 100 UNIT/ML injection   Subcutaneous   Inject 16 Units into the skin at bedtime. Reported on 10/06/2015         . Liraglutide  (VICTOZA) 18 MG/3ML SOPN   Subcutaneous   Inject 1.8 mg into the skin daily. Reported on 10/06/2015         . lisinopril (PRINIVIL,ZESTRIL) 10 MG tablet   Oral   Take 10 mg by mouth at bedtime. Reported on 10/19/2015         . magnesium oxide (MAG-OX) 400 (241.3 MG) MG tablet   Oral   Take 1 tablet by mouth daily. Reported on 09/21/2015      0   . nitroGLYCERIN (NITROSTAT) 0.4 MG SL tablet   Sublingual   Place 0.4 mg under the  tongue every 5 (five) minutes as needed for chest pain.         Marland Kitchen oxyCODONE (ROXICODONE) 15 MG immediate release tablet      Limit 1/2-1 tab by mouth 4-7 times per day if tolerated   200 tablet   0   . potassium chloride (K-DUR) 10 MEQ tablet   Oral   Take 2 tablets (20 mEq total) by mouth 2 (two) times daily. Patient not taking: Reported on 09/21/2015   20 tablet   1   . rosuvastatin (CRESTOR) 40 MG tablet   Oral   Take 40 mg by mouth at bedtime.          Marland Kitchen spironolactone (ALDACTONE) 25 MG tablet   Oral   Take 25 mg by mouth daily. Reported on 09/21/2015      0   . tiZANidine (ZANAFLEX) 4 MG tablet      Limit 1 tab po bid - tid if tolerated   90 tablet   0   . topiramate (TOPAMAX) 100 MG tablet   Oral   Take 100 mg by mouth at bedtime. Take with 25 mg tablet for a 125 mg dose         . topiramate (TOPAMAX) 25 MG tablet   Oral   Take 25 mg by mouth at bedtime.           Allergies Bactrim; Ciprofloxacin; Rocephin; Vancomycin; Azithromycin; and Food  Family History  Problem Relation Age of Onset  . Depression Mother   . Drug abuse Mother   . Early death Mother   . Hypertension Mother   . Varicose Veins Mother   . Diabetes Father   . Early death Father   . Hyperlipidemia Father     Social History Social History  Substance Use Topics  . Smoking status: Never Smoker   . Smokeless tobacco: Never Used  . Alcohol Use: No    Review of Systems  Constitutional: Negative for fever. Eyes: Negative for visual  changes. ENT: Negative for sore throat. Cardiovascular: Negative for chest pain. Respiratory: Negative for shortness of breath. Gastrointestinal: Negative for abdominal pain, vomiting and diarrhea. Genitourinary: Negative for dysuria. Musculoskeletal: Negative for back pain. Positive for neck pain and right arm pain Skin: Negative for rash. Neurological: Negative for headaches, focal weakness or numbness.   10-point ROS otherwise negative.  ____________________________________________   PHYSICAL EXAM:  VITAL SIGNS: ED Triage Vitals  Enc Vitals Group     BP 11/03/15 0348 129/78 mmHg     Pulse Rate 11/03/15 0348 91     Resp 11/03/15 0348 22     Temp 11/03/15 0348 98 F (36.7 C)     Temp Source 11/03/15 0348 Oral     SpO2 11/03/15 0348 98 %     Weight 11/03/15 0348 198 lb (89.812 kg)     Height 11/03/15 0348 _0  (1.626 m)     Head Cir --      Peak Flow --      Pain Score 11/03/15 0348 8     Pain Loc --      Pain Edu? --      Excl. in [place]? --      Constitutional: Alert and oriented. Well appearing and in no distress. Eyes: Conjunctivae are normal. PERRL. Normal extraocular movements. ENT   Head: Normocephalic and atraumatic.   Nose: No congestion/rhinnorhea.   Mouth/Throat: Mucous membranes are moist.   Neck: No stridor. Hematological/Lymphatic/Immunilogical: No cervical lymphadenopathy. Cardiovascular: Normal rate,  regular rhythm. Normal and symmetric distal pulses are present in all extremities. No murmurs, rubs, or gallops. Respiratory: Normal respiratory effort without tachypnea nor retractions. Breath sounds are clear and equal bilaterally. No wheezes/rales/rhonchi. Gastrointestinal: Soft and nontender. No distention. There is no CVA tenderness. Genitourinary: deferred Musculoskeletal: Pain with axial loading, pain with active and passive range of motion of the right arm including elbow shoulder  Neurologic:  Normal speech and language. No gross focal  neurologic deficits are appreciated. Speech is normal.  Skin:  Skin is warm, dry and intact. No rash noted. Psychiatric: Mood and affect are normal. Speech and behavior are normal. Patient exhibits appropriate insight and judgment.      RADIOLOGY      INITIAL IMPRESSION / ASSESSMENT AND PLAN / ED COURSE  Pertinent labs & imaging results that were available during my care of the patient were reviewed by me and considered in my medical decision making (see chart for details).  Given history and physical exam concern for cervical radiculopathy as such MRI of the cervical spine will be performed. Patient received Percocet one tablet and emergency department. Patient's care transferred to Dr Darl Householder with MRI cervical spine pending  ____________________________________________   FINAL CLINICAL IMPRESSION(S) / ED DIAGNOSES  Final diagnoses:  Neck pain  Right arm pain      Gregor Hams, MD 11/03/15 [phone]

## 2015-11-03 NOTE — ED Notes (Addendum)
Pt presents to ED with c/o right arm pain that starts at her shoulder and goes down to her hand and radiates up to her neck since sunday. Pt states the pain has worsened since onset. Denies n/v/d or headache. No similar symptoms previously and denies any known injury. Pt states she has been working out at the gym lately but not sure if that is related to her pain.

## 2015-11-05 DIAGNOSIS — M7551 Bursitis of right shoulder: Secondary | ICD-10-CM | POA: Diagnosis not present

## 2015-11-18 ENCOUNTER — Encounter: Payer: Self-pay | Admitting: Pain Medicine

## 2015-11-18 ENCOUNTER — Ambulatory Visit: Payer: PPO | Attending: Pain Medicine | Admitting: Pain Medicine

## 2015-11-18 VITALS — BP 155/99 | HR 94 | Temp 97.3°F | Resp 16 | Ht 65.0 in | Wt 190.0 lb

## 2015-11-18 DIAGNOSIS — M5126 Other intervertebral disc displacement, lumbar region: Secondary | ICD-10-CM

## 2015-11-18 DIAGNOSIS — M5481 Occipital neuralgia: Secondary | ICD-10-CM

## 2015-11-18 DIAGNOSIS — M533 Sacrococcygeal disorders, not elsewhere classified: Secondary | ICD-10-CM | POA: Diagnosis not present

## 2015-11-18 DIAGNOSIS — T847XXD Infection and inflammatory reaction due to other internal orthopedic prosthetic devices, implants and grafts, subsequent encounter: Secondary | ICD-10-CM

## 2015-11-18 DIAGNOSIS — M47817 Spondylosis without myelopathy or radiculopathy, lumbosacral region: Secondary | ICD-10-CM | POA: Diagnosis not present

## 2015-11-18 DIAGNOSIS — M5030 Other cervical disc degeneration, unspecified cervical region: Secondary | ICD-10-CM

## 2015-11-18 DIAGNOSIS — M5134 Other intervertebral disc degeneration, thoracic region: Secondary | ICD-10-CM

## 2015-11-18 DIAGNOSIS — M5010 Cervical disc disorder with radiculopathy, unspecified cervical region: Secondary | ICD-10-CM | POA: Diagnosis not present

## 2015-11-18 DIAGNOSIS — G43909 Migraine, unspecified, not intractable, without status migrainosus: Secondary | ICD-10-CM | POA: Diagnosis not present

## 2015-11-18 DIAGNOSIS — G43109 Migraine with aura, not intractable, without status migrainosus: Secondary | ICD-10-CM

## 2015-11-18 DIAGNOSIS — M51379 Other intervertebral disc degeneration, lumbosacral region without mention of lumbar back pain or lower extremity pain: Secondary | ICD-10-CM

## 2015-11-18 DIAGNOSIS — M542 Cervicalgia: Secondary | ICD-10-CM | POA: Diagnosis not present

## 2015-11-18 DIAGNOSIS — M5023 Other cervical disc displacement, cervicothoracic region: Secondary | ICD-10-CM | POA: Diagnosis not present

## 2015-11-18 DIAGNOSIS — M5416 Radiculopathy, lumbar region: Secondary | ICD-10-CM | POA: Diagnosis not present

## 2015-11-18 DIAGNOSIS — M5116 Intervertebral disc disorders with radiculopathy, lumbar region: Secondary | ICD-10-CM | POA: Insufficient documentation

## 2015-11-18 DIAGNOSIS — E119 Type 2 diabetes mellitus without complications: Secondary | ICD-10-CM | POA: Insufficient documentation

## 2015-11-18 DIAGNOSIS — Z9889 Other specified postprocedural states: Secondary | ICD-10-CM | POA: Diagnosis not present

## 2015-11-18 DIAGNOSIS — M4806 Spinal stenosis, lumbar region: Secondary | ICD-10-CM | POA: Diagnosis not present

## 2015-11-18 DIAGNOSIS — E1340 Other specified diabetes mellitus with diabetic neuropathy, unspecified: Secondary | ICD-10-CM

## 2015-11-18 DIAGNOSIS — R51 Headache: Secondary | ICD-10-CM | POA: Diagnosis not present

## 2015-11-18 DIAGNOSIS — M5137 Other intervertebral disc degeneration, lumbosacral region: Secondary | ICD-10-CM

## 2015-11-18 MED ORDER — OXYCODONE HCL 15 MG PO TABS: ORAL_TABLET | ORAL | Status: DC

## 2015-11-18 MED ORDER — TIZANIDINE HCL 4 MG PO TABS: ORAL_TABLET | ORAL | Status: DC

## 2015-11-18 NOTE — Patient Instructions (Addendum)
PLAN    Continue present medications. tizanidine and oxycodone  F/U PCP  Dr Richarda Overlie for evaliation of  BP cervical and upper extremity pain paresthesias and weakness and general medical  condition.. Need to discuss obtaining steroid Dosepak from primary care physician or from neurosurgeon   F/U surgical evaluation. Follow-up with Dr. [name] as discussed and as planned today  F/U Dr Clayborn Bigness as planned  Recommend deep myofascial massage/release 2 times per week with caution to avoid aggravation of symptoms   F/U neurological evaluation. Follow-up neurological evaluation as discussed with Dr. [name] as discussed  May consider radiofrequency rhizolysis or intraspinal procedures pending response to present treatment and F/U evaluation.  Patient to call Pain Management Center should patient have concerns prior to scheduled return appointment.

## 2015-11-18 NOTE — Progress Notes (Signed)
Subjective:    Patient ID: Marissa [name], female    DOB: Sep 24, 1975, 40 y.o.   MRN: [id number]  HPI    The patient is a 40 year old female who returns to pain management for further evaluation and treatment of pain involving the neck upper extremity region headaches as well as lower back and lower extremity pain. The patient is status post prior surgery of the cervical region and lumbar regions. At the present time patient has complaint of pain of the cervical region with pain and paresthesias and weakness of the upper extremity. The patient will undergo follow-up evaluation with neurosurgeon Dr.Cram and we'll also discuss her condition with her primary care physician Rainier . We informed patient that we preferred to avoid interventional treatment due to patient's general medical condition with significant cardiac condition and diabetes mellitus with concern regarding the effect of steroids around patient's overall condition. We will remain available to consider interventional treatment should patient's neurosurgeon and primary care physician wished for [place] to proceed with interventional treatment of the cervical region for treatment of pain of the cervical region and pain and paresthesias and weakness of the upper extremity. We will remain available and will await decision of patient and her physicians. We will continue Zanaflex and oxycodone for treatment of patient's condition at this time and will consider modification of treatment regimen pending further assessment of patient's as discussed and as explained to patient on today's visit.     Review of Systems     Objective:   Physical Exam  There was tenderness to palpation of the splenius capitis and occipitalis musculatures and a mild to moderate degree. No new masses of the head and neck were noted. There were no bounding pulsations of the temporal region excessive tends to palpation of the temporomandibular joint region. Palpation of  the cervical facet cervical paraspinal muscular treat and was with moderate discomfort. Patient had limited range of motion of the cervical spine there was well-healed surgical scar of the cervical region without increased warmth and erythema in the region of the scar. The patient had difficulty performing range of motion maneuvers and was with decreased grip strength noted as well. There appeared to be unremarkable Spurling's maneuver. Tinel and Phalen's maneuver were without increase of pain of significant degree. Palpation of the thoracic facet thoracic paraspinal musculature region was attends to palpation of moderate degree with moderate muscle spasms of the thoracic musculature region noted with no crepitus of the thoracic region noted. Palpation over the lumbar paraspinal must reason lumbar facet region was with moderate discomfort with lateral bending rotation extension and palpation of the lumbar facets reproducing moderate to moderately severe discomfort. The DTRs were difficult to elicit at the knees and there was questionably decreased sensation along the L5 dermatomal distribution. EHL strength appeared to be decreased. Palpation over the PSIS and PII S region was associated with moderate discomfort with mild to moderate tenderness of the greater trochanteric region and iliotibial band region. There appeared to be negative clonus negative Homans. Abdomen was without excessive tends to palpation and no costovertebral tenderness noted      Assessment & Plan:    Degenerative disc disease of the cervical spine  Status post surgical intervention of the cervical region  Cervical radiculopathy  Cervical facet syndrome  Greater occipital neuralgia  Migraine  Degenerative disc disease of the cervical spine  Status post surgical intervention of the cervical region  Cervical facet syndrome  Degenerative disc disease lumbar spine L5-S1 hardware  in good position without complicating  features. Extensive postsurgical changes in the posterior soft tissues without obvious abscess. Moderate spurring changes L1 to contributing to right lateral recess and right foraminal stenosis. Bilateral lateral recess stenosis L2-3. Lateral spurring L3-4 with possible impingement of the extraforaminal L3 nerve root. Spinal stenosis and lateral recess stenosis at L4-5. Shallow extraforaminal disc protrusion on the right which could affect the right L4 nerve root  Lumbar stenosis  Lumbar facet syndrome  Lumbar radiculopathy      PLAN   Continue present medications. tizanidine and oxycodone  F/U PCP  Dr Richarda Overlie for evaliation of  BP cervical and upper extremity pain paresthesias and weakness and general medical  condition.. Need to discuss obtaining steroid Dosepak from primary care physician or from neurosurgeon as discussed we will remain available to consider interventional treatment of the cervical region pending decision of Dr.Cram and Dr Richarda Overlie    F/U surgical evaluation. Follow-up with Dr. [name] as discussed and as planned today  F/U Dr Clayborn Bigness as planned  Recommend deep myofascial massage/release 2 times per week with caution to avoid aggravation of symptoms   F/U neurological evaluation. Follow-up neurological evaluation as discussed with Dr. [name] as discussed  May consider radiofrequency rhizolysis or intraspinal procedures pending response to present treatment and F/U evaluation.  Patient to call [facility] should patient have concerns prior to scheduled return appointment.

## 2015-11-18 NOTE — Progress Notes (Signed)
Safety precautions to be maintained throughout the outpatient stay will include: orient to surroundings, keep bed in low position, maintain call bell within reach at all times, provide assistance with transfer out of bed and ambulation.  

## 2015-11-25 DIAGNOSIS — E669 Obesity, unspecified: Secondary | ICD-10-CM | POA: Diagnosis not present

## 2015-11-25 DIAGNOSIS — E1165 Type 2 diabetes mellitus with hyperglycemia: Secondary | ICD-10-CM | POA: Diagnosis not present

## 2015-11-25 DIAGNOSIS — E1142 Type 2 diabetes mellitus with diabetic polyneuropathy: Secondary | ICD-10-CM | POA: Diagnosis not present

## 2015-11-25 DIAGNOSIS — Z794 Long term (current) use of insulin: Secondary | ICD-10-CM | POA: Diagnosis not present

## 2015-12-01 ENCOUNTER — Other Ambulatory Visit: Payer: Self-pay | Admitting: Neurosurgery

## 2015-12-01 DIAGNOSIS — M5023 Other cervical disc displacement, cervicothoracic region: Secondary | ICD-10-CM

## 2015-12-07 ENCOUNTER — Ambulatory Visit
Admission: RE | Admit: 2015-12-07 | Discharge: 2015-12-07 | Disposition: A | Discharge status: Home / Self Care | Payer: PPO | Source: Ambulatory Visit | Attending: Neurosurgery | Admitting: Neurosurgery

## 2015-12-07 DIAGNOSIS — M2578 Osteophyte, vertebrae: Secondary | ICD-10-CM | POA: Diagnosis not present

## 2015-12-07 DIAGNOSIS — M5023 Other cervical disc displacement, cervicothoracic region: Secondary | ICD-10-CM | POA: Diagnosis not present

## 2015-12-07 DIAGNOSIS — M4802 Spinal stenosis, cervical region: Secondary | ICD-10-CM | POA: Insufficient documentation

## 2015-12-14 ENCOUNTER — Encounter: Payer: Self-pay | Admitting: Pain Medicine

## 2015-12-14 ENCOUNTER — Ambulatory Visit: Payer: PPO | Attending: Pain Medicine | Admitting: Pain Medicine

## 2015-12-14 VITALS — BP 161/91 | HR 97 | Temp 98.1°F | Resp 16 | Ht 65.0 in | Wt 198.0 lb

## 2015-12-14 DIAGNOSIS — M5126 Other intervertebral disc displacement, lumbar region: Secondary | ICD-10-CM | POA: Diagnosis not present

## 2015-12-14 DIAGNOSIS — M5481 Occipital neuralgia: Secondary | ICD-10-CM | POA: Diagnosis not present

## 2015-12-14 DIAGNOSIS — G43909 Migraine, unspecified, not intractable, without status migrainosus: Secondary | ICD-10-CM | POA: Diagnosis not present

## 2015-12-14 DIAGNOSIS — E119 Type 2 diabetes mellitus without complications: Secondary | ICD-10-CM | POA: Diagnosis not present

## 2015-12-14 DIAGNOSIS — M5134 Other intervertebral disc degeneration, thoracic region: Secondary | ICD-10-CM

## 2015-12-14 DIAGNOSIS — M5116 Intervertebral disc disorders with radiculopathy, lumbar region: Secondary | ICD-10-CM | POA: Diagnosis not present

## 2015-12-14 DIAGNOSIS — M5010 Cervical disc disorder with radiculopathy, unspecified cervical region: Secondary | ICD-10-CM | POA: Insufficient documentation

## 2015-12-14 DIAGNOSIS — M5137 Other intervertebral disc degeneration, lumbosacral region: Secondary | ICD-10-CM | POA: Diagnosis not present

## 2015-12-14 DIAGNOSIS — Z9889 Other specified postprocedural states: Secondary | ICD-10-CM | POA: Insufficient documentation

## 2015-12-14 DIAGNOSIS — M533 Sacrococcygeal disorders, not elsewhere classified: Secondary | ICD-10-CM | POA: Diagnosis not present

## 2015-12-14 DIAGNOSIS — E1340 Other specified diabetes mellitus with diabetic neuropathy, unspecified: Secondary | ICD-10-CM | POA: Diagnosis not present

## 2015-12-14 DIAGNOSIS — M4806 Spinal stenosis, lumbar region: Secondary | ICD-10-CM | POA: Insufficient documentation

## 2015-12-14 DIAGNOSIS — M5416 Radiculopathy, lumbar region: Secondary | ICD-10-CM | POA: Diagnosis not present

## 2015-12-14 DIAGNOSIS — M5030 Other cervical disc degeneration, unspecified cervical region: Secondary | ICD-10-CM

## 2015-12-14 DIAGNOSIS — G43109 Migraine with aura, not intractable, without status migrainosus: Secondary | ICD-10-CM

## 2015-12-14 DIAGNOSIS — M542 Cervicalgia: Secondary | ICD-10-CM | POA: Diagnosis not present

## 2015-12-14 DIAGNOSIS — M47817 Spondylosis without myelopathy or radiculopathy, lumbosacral region: Secondary | ICD-10-CM | POA: Diagnosis not present

## 2015-12-14 MED ORDER — OXYCODONE HCL 15 MG PO TABS: ORAL_TABLET | ORAL | Status: DC

## 2015-12-14 MED ORDER — TIZANIDINE HCL 4 MG PO TABS: ORAL_TABLET | ORAL | Status: DC

## 2015-12-14 NOTE — Patient Instructions (Addendum)
PLAN    Continue present medications. tizanidine and oxycodone  F/U PCP  Dr Richarda Overlie for evaliation of  BP and general medical  condition.. Discuss steroid Dosepak for treatment of your pain of the neck and upper extremities with Dr. Richarda Overlie and Dr.Cram  F/U surgical evaluation. Follow-up with Dr. [name] as discussed to review cervical MRI and to discuss treatment plan  F/U Dr Clayborn Bigness   Recommend deep myofascial massage/release 2 times per week with caution to avoid aggravation of symptoms   F/U neurological evaluation.  May consider radiofrequency rhizolysis or intraspinal procedures pending response to present treatment and F/U evaluation.  Patient to call Pain Management Center should patient have concerns prior to scheduled return appointment.Pain Management Discharge Instructions  General Discharge Instructions :  If you need to reach your doctor call: Monday-Friday 8:00 am - 4:00 pm at [phone] or toll free [phone].  After clinic hours [phone] to have operator reach doctor.  Bring all of your medication bottles to all your appointments in the pain clinic.  To cancel or reschedule your appointment with Pain Management please remember to call 24 hours in advance to avoid a fee.  Refer to the educational materials which you have been given on: General Risks, I had my Procedure. Discharge Instructions, Post Sedation.  Post Procedure Instructions:  The drugs you were given will stay in your system until tomorrow, so for the next 24 hours you should not drive, make any legal decisions or drink any alcoholic beverages.  You may eat anything you prefer, but it is better to start with liquids then soups and crackers, and gradually work up to solid foods.  Please notify your doctor immediately if you have any unusual bleeding, trouble breathing or pain that is not related to your normal pain.  Depending on the type of procedure that was done, some parts of your  body may feel week and/or numb.  This usually clears up by tonight or the next day.  Walk with the use of an assistive device or accompanied by an adult for the 24 hours.  You may use ice on the affected area for the first 24 hours.  Put ice in a Ziploc bag and cover with a towel and place against area 15 minutes on 15 minutes off.  You may switch to heat after 24 hours.

## 2015-12-14 NOTE — Progress Notes (Signed)
Safety precautions to be maintained throughout the outpatient stay will include: orient to surroundings, keep bed in low position, maintain call bell within reach at all times, provide assistance with transfer out of bed and ambulation.  

## 2015-12-14 NOTE — Progress Notes (Signed)
Subjective:    Patient ID: Marissa [name], female    DOB: November 20, 1975, 40 y.o.   MRN: [id number]  HPI  The patient is a 40 year old female who returns to pain management for further evaluation and treatment of pain involving the neck upper extremity regions as well as the lower back and lower extremity region. The patient states that she continues to be with significant pain involving the cervical region associated with pain of the upper extremity as well as weakness of the upper extremity. The patient is scheduled to undergo follow-up evaluation with Dr. [name] as discussed patient's condition and to consider treatment options. We have asked patient to follow-up with Dr.Lithavong and Dr.Cram to consider receiving steroid Dosepak which may be of benefit the patient. The present time we will for to avoid interventional treatment due to patient's general medical condition. The patient is a significant cardiac history and has diabetes mellitus as well. At the present time we preferred to avoid the effects of the steroid injection which could significantly exacerbate the patient's conditions especially diabetes and cardiac condition. The patient will follow-up with her physicians to discuss steroid Dosepak. We remain available to consider additional modifications of treatment pending follow-up evaluation. All agreed to suggested treatment plan       Review of Systems     Objective:   Physical Exam  There was tenderness to palpation of paraspinal muscular treat the cervical region cervical facet region palpation which be produced pain of moderate degree there was limited range of motion of the cervical spine with questionably positive Spurling's maneuver. The patient was with evidence of decreased grip strength on the right compared to the left. Tinel and Phalen's maneuver were without increase of pain of significant degree. Palpation over the region of the thoracic facet thoracic paraspinal musculature  region was attends to palpation with no crepitus of the thoracic region noted. There was severe muscle spasms of the thoracic paraspinal muscular region noted. Palpation over the lumbar paraspinal must reason lumbar facet region was with moderate tenderness to palpation as well lateral bending rotation extension and palpation of the lumbar facets reproduce moderate discomfort. Straight leg raising was limited to approximately 20 with questionably decreased sensation along the L5 dermatomal distribution. DTRs were difficult to elicit. There was negative clonus negative Homans. There was tenderness over the PSIS and PII S region a moderate degree with moderate tenderness on the greater trochanteric region iliotibial band region. EHL strength was decreased.. Abdomen nontender and no costovertebral tenderness noted      Assessment & Plan:     Degenerative disc disease of the cervical spine  Status post surgical intervention of the cervical region  Cervical radiculopathy  Cervical facet syndrome  Greater occipital neuralgia  Migraine  Degenerative disc disease of the cervical spine  Status post surgical intervention of the cervical region  Cervical facet syndrome  Degenerative disc disease lumbar spine L5-S1 hardware in good position without complicating features. Extensive postsurgical changes in the posterior soft tissues without obvious abscess. Moderate spurring changes L1 to contributing to right lateral recess and right foraminal stenosis. Bilateral lateral recess stenosis L2-3. Lateral spurring L3-4 with possible impingement of the extraforaminal L3 nerve root. Spinal stenosis and lateral recess stenosis at L4-5. Shallow extraforaminal disc protrusion on the right which could affect the right L4 nerve root  Lumbar stenosis  Lumbar facet syndrome  Lumbar radiculopathy       PLAN    Continue present medications. tizanidine and oxycodone  F/U  PCP  Dr Richarda Overlie for  evaliation of  BP and general medical  condition.. Discuss steroid Dosepak for treatment of your pain of the neck and upper extremities with Dr. Richarda Overlie and Dr.Cram  F/U surgical evaluation. Follow-up with Dr. [name] as discussed to review cervical MRI and to discuss treatment plan  F/U Dr Clayborn Bigness   Recommend deep myofascial massage/release 2 times per week with caution to avoid aggravation of symptoms   F/U neurological evaluation.  May consider radiofrequency rhizolysis or intraspinal procedures pending response to present treatment and F/U evaluation.  Patient to call Pain Management Center should patient have concerns prior to scheduled return appointment

## 2015-12-19 LAB — TOXASSURE SELECT 13 (MW), URINE

## 2015-12-20 NOTE — Progress Notes (Signed)
Quick Note:  Reviewed. ______ 

## 2015-12-23 DIAGNOSIS — M542 Cervicalgia: Secondary | ICD-10-CM | POA: Diagnosis not present

## 2015-12-23 DIAGNOSIS — M5023 Other cervical disc displacement, cervicothoracic region: Secondary | ICD-10-CM | POA: Diagnosis not present

## 2015-12-27 ENCOUNTER — Ambulatory Visit: Payer: PPO | Admitting: Physical Therapy

## 2015-12-28 DIAGNOSIS — Z794 Long term (current) use of insulin: Secondary | ICD-10-CM | POA: Diagnosis not present

## 2015-12-28 DIAGNOSIS — E669 Obesity, unspecified: Secondary | ICD-10-CM | POA: Diagnosis not present

## 2015-12-28 DIAGNOSIS — E1142 Type 2 diabetes mellitus with diabetic polyneuropathy: Secondary | ICD-10-CM | POA: Diagnosis not present

## 2015-12-28 DIAGNOSIS — E11649 Type 2 diabetes mellitus with hypoglycemia without coma: Secondary | ICD-10-CM | POA: Diagnosis not present

## 2015-12-28 DIAGNOSIS — E1165 Type 2 diabetes mellitus with hyperglycemia: Secondary | ICD-10-CM | POA: Diagnosis not present

## 2015-12-30 ENCOUNTER — Ambulatory Visit: Payer: PPO | Attending: Neurosurgery | Admitting: Physical Therapy

## 2015-12-30 ENCOUNTER — Encounter: Payer: Self-pay | Admitting: Physical Therapy

## 2015-12-30 ENCOUNTER — Encounter: Payer: PPO | Admitting: Physical Therapy

## 2015-12-30 DIAGNOSIS — M6281 Muscle weakness (generalized): Secondary | ICD-10-CM | POA: Insufficient documentation

## 2015-12-30 DIAGNOSIS — M5412 Radiculopathy, cervical region: Secondary | ICD-10-CM | POA: Diagnosis not present

## 2015-12-30 DIAGNOSIS — R293 Abnormal posture: Secondary | ICD-10-CM | POA: Diagnosis not present

## 2015-12-30 DIAGNOSIS — M542 Cervicalgia: Secondary | ICD-10-CM | POA: Insufficient documentation

## 2015-12-30 NOTE — Therapy (Addendum)
[place] MAIN [facility] SERVICES [address] [place], [place], [address] Phone: [phone]   Fax:  [phone]  Physical Therapy Evaluation  Patient Details  Name: Marissa [name] MRN: [id number] Date of Birth: June 24, 1976 Referring Provider: Kary Kos MD, Reche Dixon MD (orthopedic)  Encounter Date: 12/30/2015      PT End of Session - 12/30/15 1557    Visit Number 1   Number of Visits 9   Date for PT Re-Evaluation 01/27/16   PT Start Time 1500   PT Stop Time 1545   PT Time Calculation (min) 45 min   Activity Tolerance Patient limited by pain   Behavior During Therapy Anxious      Past Medical History  Diagnosis Date  . Hypertension   . Coronary artery disease   . Neuropathy ([place])   . Headache(784.0)   . Arthritis   . Vision loss     due to diabetes  . Myocardial infarction [facility]) 2006    "due to medication"; no evidence of ischemia or infarction by nuclear stress test '11  . Dysrhythmia     tachycardia  . Diabetes mellitus without complication (HCC)     fasting cbg 50-140s  . GERD (gastroesophageal reflux disease)     otc meds  . Migraines     once/month maybe - can last up to two weeks  . Hyperlipemia   . Cardiomyopathy, dilated, nonischemic ([place])   . MI, old     2006  . Neck pain, chronic   . Back pain, chronic   . Depression   . Anxiety   . Hypercholesteremia   . Allergy   . Anemia   . Prolonged QT interval syndrome   . Cholelithiasis   . Pneumonia 2015    [facility]  . Sleep apnea     does not use cpap since losing alot of weight  . CHF (congestive heart failure) ([place])   . Renal insufficiency   . Insulin pump in place     Past Surgical History  Procedure Laterality Date  . Back surgery  2011    Lumbar   . Cervical fusion  2006  . Salpingoophorectomy Bilateral     1 1997. 2nd 2001  . Cardiac catheterization  07/18/2004    50-60% mid LAD, minor luminal irregularities RCA, normal LM and CX, EF 50-55% [facility])  . Lumbar laminectomy/decompression microdiscectomy Right 05/07/2013    Procedure: Right Lumbar one-two laminectomy;  Surgeon: Elaina Hoops, MD;  Location: [place] NEURO ORS;  Service: Neurosurgery;  Laterality: Right;  . Carpel tunnel Bilateral   . Lumbar laminectomy/decompression microdiscectomy Left 10/29/2013    Procedure: Left Lumbar five-Sacral one Laminectomy;  Surgeon: Elaina Hoops, MD;  Location: [place] NEURO ORS;  Service: Neurosurgery;  Laterality: Left;  . Lumbar wound debridement N/A 01/25/2014    Procedure: LUMBAR WOUND DEBRIDEMENT;  Surgeon: Charlie Pitter, MD;  Location: [place] NEURO ORS;  Service: Neurosurgery;  Laterality: N/A;  . Lumbar wound debridement N/A 02/25/2014    Procedure: LUMBAR WOUND DEBRIDEMENT;  Surgeon: Elaina Hoops, MD;  Location: [place] NEURO ORS;  Service: Neurosurgery;  Laterality: N/A;  . Cholecystectomy N/A 04/08/2015    Procedure: LAPAROSCOPIC CHOLECYSTECTOMY WITH INTRAOPERATIVE CHOLANGIOGRAM;  Surgeon: Florene Glen, MD;  Location: [facility] ORS;  Service: General;  Laterality: N/A;    There were no vitals filed for this visit.       Subjective Assessment - 12/30/15 1500    Subjective 40  yo Female reports neck pain for 2-3 months. She reports having a history of rotator cuff repair and s/p PT which resolved; Patient reports having numbness/tingling down RLE to fingers; She had cervical fusion C3-6 ACDF in 2006; Patient reports increased pain at night with sleeping; She reports having shooting pain which is not relieved at night; She reports getting about 4 hours of sleep per night on average; She denies any recent falls; Patient had multiple back surgeries x4 last year and her body went into sepsis and had heart, kidney and lung damage; She did get cardiac rehab; She does take medication for heart disease; She is walking independently; She does have high blood pressure and high heart rate; Still sees cardiologist every 3-6 months;    Pertinent History personal  factors affecting rehab: severity of symptoms, having to sit prolonged time at work, past neck/back surgeries, minimal response to conservative treatment   Limitations Writing  lying down, sleeping   How long can you sit comfortably? 30 min   How long can you stand comfortably? 30-60 min;   How long can you walk comfortably? 30-60 min;    Diagnostic tests CT/MRI of cervical spine, Stable appearance status post C3-6 ACDF; stable level at C2-C3 with mild narrowing;    Patient Stated Goals "Be able to go back to daily activity. Find out what's going on with my arm"; has trouble writing; needs assistance with getting dressed;    Currently in Pain? Yes   Pain Score 7    Pain Location Neck   Pain Orientation Right;Lateral   Pain Descriptors / Indicators Shooting;Pins and needles;Heaviness   Pain Type Chronic pain   Pain Radiating Towards down the right shoulder and arm;    Pain Onset More than a month ago   Aggravating Factors  lying down, sleeping   Pain Relieving Factors pain meds don't help; heat and ice do not help;    Effect of Pain on Daily Activities decreased activities;             [facility] PT Assessment - 12/30/15 0001    Assessment   Medical Diagnosis Displacement of intervertebral disc of cervicothoracic spine   Referring Provider Kary Kos MD, Reche Dixon MD (orthopedic)   Onset Date/Surgical Date 10/13/15   Hand Dominance Right   Next MD Visit 6 weeks   Prior Therapy had PT a few years ago for neck/back pain; minimal improvement; none recent   Precautions   Precautions None   Restrictions   Weight Bearing Restrictions No   Balance Screen   Has the patient fallen in the past 6 months No   Has the patient had a decrease in activity level because of a fear of falling?  No   Is the patient reluctant to leave their home because of a fear of falling?  No   Home Environment   Additional Comments 3 steps to enter home without rails; single story home; negotiates steps one step  at a time;has trouble getting dressed, fixing hair, writing; her severe pain won't allow her to lift her right arm with ADLs; lives with neice (40 yo)   Prior Function   Level of Independence Independent;Independent with gait;Independent with transfers   [employer] time employment   Vocation Requirements office work; minimal lifting; sits about 10-15 min at work at one time   Leisure travel; does have pain with prolonged car trips   Cognition   Overall Cognitive Status Within Functional Limits for tasks assessed  Observation/Other Assessments   Observations when sitting, patient sits forward and leans forward for 10 minutes without moving;    Sensation   Light Touch Appears Intact   Additional Comments has tingling along C6-C7 dermatome but intact light touch/deep pressure   Coordination   Gross Motor Movements are Fluid and Coordinated Yes   Fine Motor Movements are Fluid and Coordinated Yes   Posture/Postural Control   Posture Comments sits with forward flexed posture, moderate forward head; able to self correct with verbal cues but reports increased pain;    AROM   Right Shoulder Extension 35 Degrees  in sitting   Right Shoulder Flexion 80 Degrees  in sitting   Right Shoulder ABduction 75 Degrees  in sitting   Strength   Overall Strength Comments RUE grossly 3-/5; LUE grossly 4/5; reports pain during MMT;    Right Hand Grip (lbs) 10   Left Hand Grip (lbs) 40   Palpation   Spinal mobility hypomobility noted throughout thoracic spine with PA mobs;    Palpation comment Patient reports severe tenderness to palpation of right cervical paraspinals and along thoracic spine;    Spurling's   Findings Positive   Side Right   Comment has pain along RUE with compression in cervical spine;    Transfers   Comments able to transfer independently   Ambulation/Gait   Gait Comments ambulates independently on even surface; was wearing 4 inch heels/wedges to PT evaluation without  difficulty;    High Level Balance   High Level Balance Comments able to stand feet together without loss of balance; no imbalance noted with dynamic tasks;        TREATMENT: PT applied moist heat to thoracic spine x4 min (Unbilled) with patient prone; Following spinal assessment: PT performed grade II-III PA mobs to thoracic spine T1, T2, T3, T4, T5, T6, T7, T8, 10 sec bouts x2 each level; Patient reports increased discomfort along upper thoracic spine. Reassessed RUE shoulder AROM following joint mobs with no improvement;  Educated patient in HEP: Seated scapular retraction 3 sec hold x5 reps; Posterior shoulder rolls x5 Educated patient in better posture for improved positioning and to reduce back pain;          Gcode: [id number] Current CL (60-80% impaired) [id number] Goal CI (1-20% impaired) Based on clinical judgement, ROM/strength             PT Education - 12/30/15 1557    Education provided Yes   Education Details HEP initiated, recommendations   Person(s) Educated Patient   Methods Explanation;Verbal cues;Handout   Comprehension Verbalized understanding;Returned demonstration;Verbal cues required             PT Long Term Goals - 12/30/15 1601    PT LONG TERM GOAL #1   Title Patient will be independent in home exercise program to improve strength/mobility for better functional independence with ADLs.   Time 4   Period Weeks   Status New   PT LONG TERM GOAL #2   Title Patient will increase grip strength to >40 pounds bilaterally for increase functional grip for lifting, carrying objects and performing ADLs   Time 4   Period Weeks   Status New   PT LONG TERM GOAL #3   Title Patient will increase BUE gross strength to 4+/5 for functional strength with lifting and carrying tasks.    Time 4   Period Weeks   Status New   PT LONG TERM GOAL #4   Title  Patient will decrease  Quick DASH score by > 8 points demonstrating reduced self-reported upper extremity  disability.   Time 4   Period Weeks   Status New   PT LONG TERM GOAL #5   Title Patient will increase RUE shoulder AROM to flexion/abduction >120 for functional ROM to reduce difficulty with ADLs such as dressing and hygiene.   Time 4   Period Weeks   Status New               Plan - 12/30/15 1558    Clinical Impression Statement 40 yo Female reports increased right sided neck pain with pain and tingling radiating down RUE over past 2 months. She has a history of chronic neck and back pain. she has had multiple surgeries with ACDF of C3-C6 in 2006. Patient tested positive for possible nerve impingement in cervical spine with positive spurling's sign. She also exhibits hypomobility throughout thoracic spine. She exhibits forward flexed posture with forward rounded shoulders which could be contributing to pain. Patient would benefit from skilled PT Intervention to improve cervical ROM, increase RUE AROM and reduce pain with ADLs.    Rehab Potential Fair   Clinical Impairments Affecting Rehab Potential positive: young in age; negative: chronic pain and severe pain with history of multiple spinal surgeries and multiple co-morbidities; Patient's clinical presentation is evolving as she has pain in her neck and down RUE which varies with activity.    PT Frequency 2x / week   PT Duration 4 weeks   PT Treatment/Interventions ADLs/Self Care Home Management;Cryotherapy;Electrical Stimulation;Moist Heat;Traction;Therapeutic exercise;Therapeutic activities;Functional mobility training;Ultrasound;Patient/family education;Manual techniques;Taping;Energy conservation;Dry needling;Passive range of motion   PT Next Visit Plan postural strengthening, assess cervical ROM   PT Home Exercise Plan initiated- see patient instructions   Consulted and Agree with Plan of Care Patient      Patient will benefit from skilled therapeutic intervention in order to improve the following deficits and impairments:   Impaired sensation, Hypomobility, Pain, Impaired UE functional use, Decreased strength, Decreased activity tolerance, Improper body mechanics, Decreased range of motion, Postural dysfunction, Impaired flexibility  Visit Diagnosis: Cervicalgia - Plan: PT plan of care cert/re-cert  Radiculopathy, cervical region - Plan: PT plan of care cert/re-cert  Abnormal posture - Plan: PT plan of care cert/re-cert  Muscle weakness (generalized) - Plan: PT plan of care cert/re-cert     Problem List Patient Active Problem List   Diagnosis Date Noted  . Cardiomyopathy ([place]) 04/23/2015  . Long term current use of antibiotics 04/23/2015  . Biliary colic [id number]  . Calculus of gallbladder with acute cholecystitis 04/07/2015  . Bilateral occipital neuralgia 01/21/2015  . Dizziness 01/04/2015  . Headache 01/04/2015  . DDD (degenerative disc disease), cervical 12/18/2014  . DDD (degenerative disc disease), thoracic 12/18/2014  . DDD (degenerative disc disease), lumbosacral 12/18/2014  . Neuropathy due to secondary diabetes ([place]) 12/18/2014  . Migraine 12/18/2014  . Hypercholesterolemia without hypertriglyceridemia 08/28/2014  . HPV test positive 07/17/2014  . Class 2 obesity 04/15/2014  . CAD in native artery 04/15/2014  . Breathlessness on exertion 03/24/2014  . Wound infection complicating hardware ([place]) 02/25/2014  . Nausea alone 02/01/2014  . Abdominal pain, unspecified site 02/01/2014  . Severe sepsis(995.92) 01/30/2014  . History of lumbar laminectomy 01/30/2014  . Acute renal failure ([place]) 01/30/2014  . Anemia 01/30/2014  . HTN (hypertension) 01/30/2014  . DM (diabetes mellitus), type 2 with renal complications ([place]) 0000000  . Wound infection ([place]) 01/25/2014  . Acute respiratory failure with hypoxia ([place]) 01/25/2014  . Type  2 diabetes mellitus treated with insulin ([place]) 01/13/2014  . Essential (primary) hypertension 01/13/2014  . Difficulty speaking 01/13/2014  . Clinical  depression 01/13/2014  . Back pain 11/01/2013  . HNP (herniated nucleus pulposus), lumbar 10/29/2013    Danielly Ackerley  PT, DPT  12/30/2015, 4:05 PM  [place] MAIN [facility] SERVICES [address] [place], [place], [address] Phone: [phone]   Fax:  [phone]  Name: AVRIL NUXOLL MRN: [id number] Date of Birth: 29-Mar-1976

## 2015-12-30 NOTE — Patient Instructions (Signed)
Copyright  VHI. All rights reserved.   Roll   Inhale and bring shoulders up, back, then exhale and relax shoulders down. Repeat _10__ times. Do _3__ times per day.  Copyright  VHI. All rights reserved.    Scapular Retraction (Standing)    With arms at sides, pinch shoulder blades together. Repeat __10__ times per set. Do _2___ sets per session. Do _3___ sessions per day.  http://[email or web address].exer.[email or web address]   Copyright  VHI. All rights reserved.

## 2016-01-04 ENCOUNTER — Ambulatory Visit: Payer: PPO | Admitting: Physical Therapy

## 2016-01-04 DIAGNOSIS — E1142 Type 2 diabetes mellitus with diabetic polyneuropathy: Secondary | ICD-10-CM | POA: Diagnosis not present

## 2016-01-04 DIAGNOSIS — E1165 Type 2 diabetes mellitus with hyperglycemia: Secondary | ICD-10-CM | POA: Diagnosis not present

## 2016-01-04 DIAGNOSIS — Z794 Long term (current) use of insulin: Secondary | ICD-10-CM | POA: Diagnosis not present

## 2016-01-04 DIAGNOSIS — E669 Obesity, unspecified: Secondary | ICD-10-CM | POA: Diagnosis not present

## 2016-01-05 ENCOUNTER — Ambulatory Visit: Payer: PPO | Admitting: Physical Therapy

## 2016-01-05 DIAGNOSIS — M542 Cervicalgia: Secondary | ICD-10-CM

## 2016-01-05 DIAGNOSIS — M6281 Muscle weakness (generalized): Secondary | ICD-10-CM

## 2016-01-05 DIAGNOSIS — M5412 Radiculopathy, cervical region: Secondary | ICD-10-CM

## 2016-01-05 DIAGNOSIS — R293 Abnormal posture: Secondary | ICD-10-CM

## 2016-01-05 NOTE — Therapy (Signed)
[place] MAIN [facility] SERVICES [address] [place], [place], [address] Phone: [phone]   Fax:  [phone]  Physical Therapy Treatment  Patient Details  Name: Marissa [name] MRN: [id number] Date of Birth: 11/26/1975 Referring Provider: Kary Kos MD, Reche Dixon MD (orthopedic)  Encounter Date: 01/05/2016      PT End of Session - 01/05/16 1451    Visit Number 2   Number of Visits 9   Date for PT Re-Evaluation 01/27/16   Authorization Type Gcode 2   Authorization Time Period 10   PT Start Time 1445   PT Stop Time 1520   PT Time Calculation (min) 35 min   Activity Tolerance Patient limited by pain   Behavior During Therapy Anxious      Past Medical History  Diagnosis Date  . Hypertension   . Coronary artery disease   . Neuropathy ([place])   . Headache(784.0)   . Arthritis   . Vision loss     due to diabetes  . Myocardial infarction [facility]) 2006    "due to medication"; no evidence of ischemia or infarction by nuclear stress test '11  . Dysrhythmia     tachycardia  . Diabetes mellitus without complication (HCC)     fasting cbg 50-140s  . GERD (gastroesophageal reflux disease)     otc meds  . Migraines     once/month maybe - can last up to two weeks  . Hyperlipemia   . Cardiomyopathy, dilated, nonischemic ([place])   . MI, old     2006  . Neck pain, chronic   . Back pain, chronic   . Depression   . Anxiety   . Hypercholesteremia   . Allergy   . Anemia   . Prolonged QT interval syndrome   . Cholelithiasis   . Pneumonia 2015    [facility]  . Sleep apnea     does not use cpap since losing alot of weight  . CHF (congestive heart failure) ([place])   . Renal insufficiency   . Insulin pump in place     Past Surgical History  Procedure Laterality Date  . Back surgery  2011    Lumbar   . Cervical fusion  2006  . Salpingoophorectomy Bilateral     1 1997. 2nd 2001  . Cardiac catheterization  07/18/2004    50-60% mid LAD, minor  luminal irregularities RCA, normal LM and CX, EF 50-55% [facility])  . Lumbar laminectomy/decompression microdiscectomy Right 05/07/2013    Procedure: Right Lumbar one-two laminectomy;  Surgeon: Elaina Hoops, MD;  Location: [place] NEURO ORS;  Service: Neurosurgery;  Laterality: Right;  . Carpel tunnel Bilateral   . Lumbar laminectomy/decompression microdiscectomy Left 10/29/2013    Procedure: Left Lumbar five-Sacral one Laminectomy;  Surgeon: Elaina Hoops, MD;  Location: [place] NEURO ORS;  Service: Neurosurgery;  Laterality: Left;  . Lumbar wound debridement N/A 01/25/2014    Procedure: LUMBAR WOUND DEBRIDEMENT;  Surgeon: Charlie Pitter, MD;  Location: [place] NEURO ORS;  Service: Neurosurgery;  Laterality: N/A;  . Lumbar wound debridement N/A 02/25/2014    Procedure: LUMBAR WOUND DEBRIDEMENT;  Surgeon: Elaina Hoops, MD;  Location: [place] NEURO ORS;  Service: Neurosurgery;  Laterality: N/A;  . Cholecystectomy N/A 04/08/2015    Procedure: LAPAROSCOPIC CHOLECYSTECTOMY WITH INTRAOPERATIVE CHOLANGIOGRAM;  Surgeon: Florene Glen, MD;  Location: [facility] ORS;  Service: General;  Laterality: N/A;    There were no vitals filed for this visit.  Subjective Assessment - 01/05/16 1444    Subjective Patient reports no change in symptoms with exercise. She reports increased UE discomfort today as compared to neck pain;    Pertinent History personal factors affecting rehab: severity of symptoms, having to sit prolonged time at work, past neck/back surgeries, minimal response to conservative treatment   Limitations Writing  lying down, sleeping   How long can you sit comfortably? 30 min   How long can you stand comfortably? 30-60 min;   How long can you walk comfortably? 30-60 min;    Diagnostic tests CT/MRI of cervical spine, Stable appearance status post C3-6 ACDF; stable level at C2-C3 with mild narrowing;    Patient Stated Goals "Be able to go back to daily activity. Find out what's going on with my arm"; has  trouble writing; needs assistance with getting dressed;    Currently in Pain? Yes   Pain Score 7    Pain Location Shoulder   Pain Orientation Right;Anterior   Pain Descriptors / Indicators Aching;Sore;Pins and needles   Pain Type Chronic pain   Pain Onset More than a month ago         TREATMENT: Warm up on UBE, Backwards only level 2 x2 min (Unbilled);  Standing with red tband: BUE shoulder extension 2x5 BUE shoulder rows 2x5 Patient required mod VCs to increase scapular retraction with UE movement for better strengthening;  Posterior shoulder rolls x10  Seated: Chin tuck 3 sec hold x5 reps; Upper trap stretch right only 10 sec hold x3; Patient required min VCs for correct positioning for better tissue extensibility; Seated cervical extension AROM x10 reps;  Patient has pain with all movement and is guarded with all movement;  Finished with TENs to right shoulder/cervical paraspinals, at tolerated intensity, interferential setting x15 min concurrent with moist heat to cervical spine and RUE;                          PT Education - 01/05/16 1450    Education provided Yes   Education Details HEP advanced, postural strengthening;    Person(s) Educated Patient   Methods Explanation;Verbal cues;Handout   Comprehension Verbalized understanding;Returned demonstration;Verbal cues required             PT Long Term Goals - 12/30/15 1601    PT LONG TERM GOAL #1   Title Patient will be independent in home exercise program to improve strength/mobility for better functional independence with ADLs.   Time 4   Period Weeks   Status New   PT LONG TERM GOAL #2   Title Patient will increase grip strength to >40 pounds bilaterally for increase functional grip for lifting, carrying objects and performing ADLs   Time 4   Period Weeks   Status New   PT LONG TERM GOAL #3   Title Patient will increase BUE gross strength to 4+/5 for functional strength with  lifting and carrying tasks.    Time 4   Period Weeks   Status New   PT LONG TERM GOAL #4   Title  Patient will decrease Quick DASH score by > 8 points demonstrating reduced self-reported upper extremity disability.   Time 4   Period Weeks   Status New   PT LONG TERM GOAL #5   Title Patient will increase RUE shoulder AROM to flexion/abduction >120 for functional ROM to reduce difficulty with ADLs such as dressing and hygiene.   Time 4   Period Weeks  Status New               Plan - 01/05/16 1508    Clinical Impression Statement Patient late to treatment session; Instructed patient in postural strengthening and cervical ROM exercise. She continues to have high levels of pain and does not tolerate exercise well. She does require min VCs for correct exercise technique. Patient educated in importance of doing postural strengthening exercise to reduce shoulder impingement and reduce shoulder pain. She would benefit from additional skilled PT intervention to improve ROM and reduce pain with ADLs.    Rehab Potential Fair   Clinical Impairments Affecting Rehab Potential positive: young in age; negative: chronic pain and severe pain with history of multiple spinal surgeries and multiple co-morbidities; Patient's clinical presentation is evolving as she has pain in her neck and down RUE which varies with activity.    PT Frequency 2x / week   PT Duration 4 weeks   PT Treatment/Interventions ADLs/Self Care Home Management;Cryotherapy;Electrical Stimulation;Moist Heat;Traction;Therapeutic exercise;Therapeutic activities;Functional mobility training;Ultrasound;Patient/family education;Manual techniques;Taping;Energy conservation;Dry needling;Passive range of motion   PT Next Visit Plan postural strengthening, assess cervical ROM   PT Home Exercise Plan advanced- see patient instructions;    Consulted and Agree with Plan of Care Patient      Patient will benefit from skilled therapeutic  intervention in order to improve the following deficits and impairments:  Impaired sensation, Hypomobility, Pain, Impaired UE functional use, Decreased strength, Decreased activity tolerance, Improper body mechanics, Decreased range of motion, Postural dysfunction, Impaired flexibility  Visit Diagnosis: Cervicalgia  Radiculopathy, cervical region  Abnormal posture  Muscle weakness (generalized)     Problem List Patient Active Problem List   Diagnosis Date Noted  . Cardiomyopathy ([place]) 04/23/2015  . Long term current use of antibiotics 04/23/2015  . Biliary colic Q000111Q  . Calculus of gallbladder with acute cholecystitis 04/07/2015  . Bilateral occipital neuralgia 01/21/2015  . Dizziness 01/04/2015  . Headache 01/04/2015  . DDD (degenerative disc disease), cervical 12/18/2014  . DDD (degenerative disc disease), thoracic 12/18/2014  . DDD (degenerative disc disease), lumbosacral 12/18/2014  . Neuropathy due to secondary diabetes ([place]) 12/18/2014  . Migraine 12/18/2014  . Hypercholesterolemia without hypertriglyceridemia 08/28/2014  . HPV test positive 07/17/2014  . Class 2 obesity 04/15/2014  . CAD in native artery 04/15/2014  . Breathlessness on exertion 03/24/2014  . Wound infection complicating hardware ([place]) 02/25/2014  . Nausea alone 02/01/2014  . Abdominal pain, unspecified site 02/01/2014  . Severe sepsis(995.92) 01/30/2014  . History of lumbar laminectomy 01/30/2014  . Acute renal failure ([place]) 01/30/2014  . Anemia 01/30/2014  . HTN (hypertension) 01/30/2014  . DM (diabetes mellitus), type 2 with renal complications ([place]) 0000000  . Wound infection ([place]) 01/25/2014  . Acute respiratory failure with hypoxia ([place]) 01/25/2014  . Type 2 diabetes mellitus treated with insulin ([place]) 01/13/2014  . Essential (primary) hypertension 01/13/2014  . Difficulty speaking 01/13/2014  . Clinical depression 01/13/2014  . Back pain 11/01/2013  . HNP (herniated nucleus  pulposus), lumbar 10/29/2013    Trotter,Margaret PT, DPT 01/05/2016, 3:16 PM  [place] MAIN [facility] SERVICES [address] [place], [place], [address] Phone: [phone]   Fax:  [phone]  Name: Marissa [name] MRN: [id number] Date of Birth: Sep 15, 1975

## 2016-01-05 NOTE — Patient Instructions (Addendum)
  Shoulder Retraction   Tie band around door knob (sitting or standing, holding band in both hands) Facing chest height anchor, grasp ends of band and pull hands to chest, squeezing shoulder blades together. Hold _3-5seconds. Repeat _10 times. Do _2_ sessions per day. Safety Note: Be sure anchor is secure.  Copyright  VHI. All rights reserved.  Strengthening: Resisted Extension   Hold band in both hands, Pull arm back, elbow straight, squeezing shoulder blades, Repeat _10___ times per set. Do _2___ sets per session. Do __1__ sessions per day.  http://orth.exer.us/833   Copyright  VHI. All rights reserved.    Flexors, Sitting / Standing   Stand or sit, head in comfortable, centered position. Draw chin in, pulling head straight back, keeping jaw and eyes level. Hold _5__ seconds. Repeat 10___ times per session. Do _2-3__ sessions per day.   Copyright  VHI. All rights reserved.  Neck Stretch ([place]) - Variation 1   One hand holding edge of chair, roll same side shoulder back to open chest. Tip head to other side, chin slightly tucked. Optional: Place other hand above temple for sensitive pressure to deepen stretch. Hold for ___5 sec. Repeat _5___ times each side.    Copyright  VHI. All rights reserved.  AROM: Neck Extension   Bend head backward. Hold _3___ seconds. Repeat _10___ times per set. Do __1__ sets per session. Do _2__ sessions per day.  http://orth.exer.B3385242

## 2016-01-06 ENCOUNTER — Encounter: Payer: PPO | Admitting: Physical Therapy

## 2016-01-11 ENCOUNTER — Ambulatory Visit: Payer: PPO | Admitting: Physical Therapy

## 2016-01-11 DIAGNOSIS — E1142 Type 2 diabetes mellitus with diabetic polyneuropathy: Secondary | ICD-10-CM | POA: Diagnosis not present

## 2016-01-11 DIAGNOSIS — Z794 Long term (current) use of insulin: Secondary | ICD-10-CM | POA: Diagnosis not present

## 2016-01-11 DIAGNOSIS — E11649 Type 2 diabetes mellitus with hypoglycemia without coma: Secondary | ICD-10-CM | POA: Diagnosis not present

## 2016-01-11 DIAGNOSIS — E669 Obesity, unspecified: Secondary | ICD-10-CM | POA: Diagnosis not present

## 2016-01-11 DIAGNOSIS — E1165 Type 2 diabetes mellitus with hyperglycemia: Secondary | ICD-10-CM | POA: Diagnosis not present

## 2016-01-13 ENCOUNTER — Other Ambulatory Visit: Payer: Self-pay | Admitting: Unknown Physician Specialty

## 2016-01-13 ENCOUNTER — Ambulatory Visit: Payer: PPO | Attending: Neurosurgery | Admitting: Physical Therapy

## 2016-01-13 ENCOUNTER — Encounter: Payer: Self-pay | Admitting: Physical Therapy

## 2016-01-13 DIAGNOSIS — R293 Abnormal posture: Secondary | ICD-10-CM | POA: Diagnosis not present

## 2016-01-13 DIAGNOSIS — M542 Cervicalgia: Secondary | ICD-10-CM | POA: Diagnosis not present

## 2016-01-13 DIAGNOSIS — M25511 Pain in right shoulder: Principal | ICD-10-CM

## 2016-01-13 DIAGNOSIS — M6281 Muscle weakness (generalized): Secondary | ICD-10-CM | POA: Diagnosis not present

## 2016-01-13 DIAGNOSIS — M5412 Radiculopathy, cervical region: Secondary | ICD-10-CM | POA: Diagnosis not present

## 2016-01-13 DIAGNOSIS — G8929 Other chronic pain: Secondary | ICD-10-CM

## 2016-01-13 NOTE — Therapy (Signed)
[place] MAIN [facility] SERVICES [address] [place], [place], [address] Phone: [phone]   Fax:  [phone]  Physical Therapy Treatment  Patient Details  Name: Marissa [name] MRN: [id number] Date of Birth: 12-17-1975 Referring Provider: Kary Kos MD, Reche Dixon MD (orthopedic)  Encounter Date: 01/13/2016      PT End of Session - 01/13/16 1441    Visit Number 3   Number of Visits 9   Date for PT Re-Evaluation 01/27/16   Authorization Type Gcode 3   Authorization Time Period 10   PT Start Time 1432   PT Stop Time 1520   PT Time Calculation (min) 48 min   Activity Tolerance Patient limited by pain   Behavior During Therapy [facility] for tasks assessed/performed      Past Medical History  Diagnosis Date  . Hypertension   . Coronary artery disease   . Neuropathy ([place])   . Headache(784.0)   . Arthritis   . Vision loss     due to diabetes  . Myocardial infarction [facility]) 2006    "due to medication"; no evidence of ischemia or infarction by nuclear stress test '11  . Dysrhythmia     tachycardia  . Diabetes mellitus without complication (HCC)     fasting cbg 50-140s  . GERD (gastroesophageal reflux disease)     otc meds  . Migraines     once/month maybe - can last up to two weeks  . Hyperlipemia   . Cardiomyopathy, dilated, nonischemic ([place])   . MI, old     2006  . Neck pain, chronic   . Back pain, chronic   . Depression   . Anxiety   . Hypercholesteremia   . Allergy   . Anemia   . Prolonged QT interval syndrome   . Cholelithiasis   . Pneumonia 2015    [facility]  . Sleep apnea     does not use cpap since losing alot of weight  . CHF (congestive heart failure) ([place])   . Renal insufficiency   . Insulin pump in place     Past Surgical History  Procedure Laterality Date  . Back surgery  2011    Lumbar   . Cervical fusion  2006  . Salpingoophorectomy Bilateral     1 1997. 2nd 2001  . Cardiac catheterization  07/18/2004     50-60% mid LAD, minor luminal irregularities RCA, normal LM and CX, EF 50-55% [facility])  . Lumbar laminectomy/decompression microdiscectomy Right 05/07/2013    Procedure: Right Lumbar one-two laminectomy;  Surgeon: Elaina Hoops, MD;  Location: [place] NEURO ORS;  Service: Neurosurgery;  Laterality: Right;  . Carpel tunnel Bilateral   . Lumbar laminectomy/decompression microdiscectomy Left 10/29/2013    Procedure: Left Lumbar five-Sacral one Laminectomy;  Surgeon: Elaina Hoops, MD;  Location: [place] NEURO ORS;  Service: Neurosurgery;  Laterality: Left;  . Lumbar wound debridement N/A 01/25/2014    Procedure: LUMBAR WOUND DEBRIDEMENT;  Surgeon: Charlie Pitter, MD;  Location: [place] NEURO ORS;  Service: Neurosurgery;  Laterality: N/A;  . Lumbar wound debridement N/A 02/25/2014    Procedure: LUMBAR WOUND DEBRIDEMENT;  Surgeon: Elaina Hoops, MD;  Location: [place] NEURO ORS;  Service: Neurosurgery;  Laterality: N/A;  . Cholecystectomy N/A 04/08/2015    Procedure: LAPAROSCOPIC CHOLECYSTECTOMY WITH INTRAOPERATIVE CHOLANGIOGRAM;  Surgeon: Florene Glen, MD;  Location: [facility] ORS;  Service: General;  Laterality: N/A;    There were no vitals filed for this visit.  Subjective Assessment - 01/13/16 1439    Subjective Patient reports increased pain over last few days down RUE; She reports no significant change in pain. "I had a tremor and shaking in my right arm over last few days."    Pertinent History personal factors affecting rehab: severity of symptoms, having to sit prolonged time at work, past neck/back surgeries, minimal response to conservative treatment   Limitations Writing  lying down, sleeping   How long can you sit comfortably? 30 min   How long can you stand comfortably? 30-60 min;   How long can you walk comfortably? 30-60 min;    Diagnostic tests CT/MRI of cervical spine, Stable appearance status post C3-6 ACDF; stable level at C2-C3 with mild narrowing;    Patient Stated Goals "Be able to go  back to daily activity. Find out what's going on with my arm"; has trouble writing; needs assistance with getting dressed;    Currently in Pain? Yes   Pain Score 6    Pain Location Shoulder   Pain Orientation Right   Pain Descriptors / Indicators Aching;Sore;Pins and needles   Pain Type Chronic pain   Pain Onset More than a month ago          TREATMENT: Warm up on UBE, Backwards only level 1 x 3 min (Unbilled);  Doorway stretch 20 sec hold x2;  Standing with red tband: RUE shoulder extension x5 RUE shoulder external rotation 2x5  Patient required min VCs to increase scapular retraction with UE movement for better strengthening;  Posterior shoulder rolls x10  Facing wall: BUE "V" in pain tolerable ROM x5 reps; Required min VCs to increase push through UE and to improve UE positioning for scapular retraction;   Sidelying: RUE shoulder external rotation 1# x10 RUE shoulder abduction AROM  x5; RUE shoulder flexion AROM 1# x5;  Patient supine; PT Performed grade II Inferior/anterior/posterior glenohumeral joint mobs to right shoulder 10 sec bouts x3 each; PROM of RUE shoulder in all directions; patient has significant increase in right shoulder pain with PROM but demonstrates good joint mobility with increased laxity;  Patient has pain with all movement and is guarded with all movement;  Finished with ultrasound to right shoulder (anterior/superior) 1 MHz, 1.8 watts per centimeter squared, x8 min with ultrasound gel and biofreeze; patient reports slight reduction in shoulder pain after [place] but denies any significant changes and is still grimacing with RUE movement;                             PT Education - 01/13/16 1441    Education provided Yes   Education Details postural strengthening, ROM;    Person(s) Educated Patient   Methods Explanation;Verbal cues   Comprehension Verbalized understanding;Returned demonstration;Verbal cues required              PT Long Term Goals - 12/30/15 1601    PT LONG TERM GOAL #1   Title Patient will be independent in home exercise program to improve strength/mobility for better functional independence with ADLs.   Time 4   Period Weeks   Status New   PT LONG TERM GOAL #2   Title Patient will increase grip strength to >40 pounds bilaterally for increase functional grip for lifting, carrying objects and performing ADLs   Time 4   Period Weeks   Status New   PT LONG TERM GOAL #3   Title Patient will increase BUE gross strength to  4+/5 for functional strength with lifting and carrying tasks.    Time 4   Period Weeks   Status New   PT LONG TERM GOAL #4   Title  Patient will decrease Quick DASH score by > 8 points demonstrating reduced self-reported upper extremity disability.   Time 4   Period Weeks   Status New   PT LONG TERM GOAL #5   Title Patient will increase RUE shoulder AROM to flexion/abduction >120 for functional ROM to reduce difficulty with ADLs such as dressing and hygiene.   Time 4   Period Weeks   Status New               Plan - 01/14/16 0805    Clinical Impression Statement Patient continues to have increased pain with UE movement. She is guarded with most AROM. She reports increased burning and discomfort with any overhead lifting. Patient required mod VCs for correct exercise technique; PT attempted PROM/joint mobs of RUE shoulder to reduce discomfort however patient unable to tolerate; Finished with ultrasound to right shoulder. Patient reports slight reduction in pain but no significant changes; She would benefit from additional skilled PT Intervention to improve neck/shoulder ROM and reduce pain with ADLs.    Rehab Potential Fair   Clinical Impairments Affecting Rehab Potential positive: young in age; negative: chronic pain and severe pain with history of multiple spinal surgeries and multiple co-morbidities; Patient's clinical presentation is evolving as she  has pain in her neck and down RUE which varies with activity.    PT Frequency 2x / week   PT Duration 4 weeks   PT Treatment/Interventions ADLs/Self Care Home Management;Cryotherapy;Electrical Stimulation;Moist Heat;Traction;Therapeutic exercise;Therapeutic activities;Functional mobility training;Ultrasound;Patient/family education;Manual techniques;Taping;Energy conservation;Dry needling;Passive range of motion   PT Next Visit Plan postural strengthening, assess cervical ROM   PT Home Exercise Plan advanced- see patient instructions;    Consulted and Agree with Plan of Care Patient      Patient will benefit from skilled therapeutic intervention in order to improve the following deficits and impairments:  Impaired sensation, Hypomobility, Pain, Impaired UE functional use, Decreased strength, Decreased activity tolerance, Improper body mechanics, Decreased range of motion, Postural dysfunction, Impaired flexibility  Visit Diagnosis: Cervicalgia  Radiculopathy, cervical region  Abnormal posture  Muscle weakness (generalized)     Problem List Patient Active Problem List   Diagnosis Date Noted  . Cardiomyopathy ([place]) 04/23/2015  . Long term current use of antibiotics 04/23/2015  . Biliary colic Q000111Q  . Calculus of gallbladder with acute cholecystitis 04/07/2015  . Bilateral occipital neuralgia 01/21/2015  . Dizziness 01/04/2015  . Headache 01/04/2015  . DDD (degenerative disc disease), cervical 12/18/2014  . DDD (degenerative disc disease), thoracic 12/18/2014  . DDD (degenerative disc disease), lumbosacral 12/18/2014  . Neuropathy due to secondary diabetes ([place]) 12/18/2014  . Migraine 12/18/2014  . Hypercholesterolemia without hypertriglyceridemia 08/28/2014  . HPV test positive 07/17/2014  . Class 2 obesity 04/15/2014  . CAD in native artery 04/15/2014  . Breathlessness on exertion 03/24/2014  . Wound infection complicating hardware ([place]) 02/25/2014  . Nausea alone  02/01/2014  . Abdominal pain, unspecified site 02/01/2014  . Severe sepsis(995.92) 01/30/2014  . History of lumbar laminectomy 01/30/2014  . Acute renal failure ([place]) 01/30/2014  . Anemia 01/30/2014  . HTN (hypertension) 01/30/2014  . DM (diabetes mellitus), type 2 with renal complications ([place]) 0000000  . Wound infection ([place]) 01/25/2014  . Acute respiratory failure with hypoxia ([place]) 01/25/2014  . Type 2 diabetes mellitus treated with insulin (  [place]) 01/13/2014  . Essential (primary) hypertension 01/13/2014  . Difficulty speaking 01/13/2014  . Clinical depression 01/13/2014  . Back pain 11/01/2013  . HNP (herniated nucleus pulposus), lumbar 10/29/2013    Arinze Rivadeneira PT, DPT 01/14/2016, 8:07 AM  [place] MAIN [facility] SERVICES [address] [place], [place], [address] Phone: [phone]   Fax:  [phone]  Name: Marissa [name] MRN: [id number] Date of Birth: 22-Dec-1975

## 2016-01-13 NOTE — Patient Instructions (Addendum)
  Progressive Resisted: External Rotation (Side-Lying)    Holding _1___ pound weight (soup can/water bottle), towel under arm, raise right forearm toward ceiling. Keep elbow bent and at side. Repeat _10___ times per set. Do _1-2___ sets per session. Do __2__ sessions per day.  http://orth.exer.us/879   Copyright  VHI. All rights reserved.  Strengthening: Resisted External Rotation    Hold tubing in right hand, elbow at side and forearm across body. Rotate forearm out. Repeat __10__ times per set. Do _1-2___ sets per session. Do __2__ sessions per day.  http://orth.exer.us/829   Copyright  VHI. All rights reserved.

## 2016-01-17 ENCOUNTER — Ambulatory Visit: Payer: PPO | Admitting: Physical Therapy

## 2016-01-18 ENCOUNTER — Encounter: Payer: PPO | Admitting: Physical Therapy

## 2016-01-18 ENCOUNTER — Ambulatory Visit: Payer: PPO | Attending: Pain Medicine | Admitting: Pain Medicine

## 2016-01-18 ENCOUNTER — Encounter: Payer: Self-pay | Admitting: Pain Medicine

## 2016-01-18 VITALS — BP 132/82 | HR 92 | Temp 98.0°F | Resp 18 | Ht 64.0 in | Wt 196.0 lb

## 2016-01-18 DIAGNOSIS — M47817 Spondylosis without myelopathy or radiculopathy, lumbosacral region: Secondary | ICD-10-CM | POA: Diagnosis not present

## 2016-01-18 DIAGNOSIS — M5126 Other intervertebral disc displacement, lumbar region: Secondary | ICD-10-CM

## 2016-01-18 DIAGNOSIS — E1340 Other specified diabetes mellitus with diabetic neuropathy, unspecified: Secondary | ICD-10-CM

## 2016-01-18 DIAGNOSIS — R51 Headache: Secondary | ICD-10-CM | POA: Diagnosis not present

## 2016-01-18 DIAGNOSIS — M5412 Radiculopathy, cervical region: Secondary | ICD-10-CM | POA: Diagnosis not present

## 2016-01-18 DIAGNOSIS — G43909 Migraine, unspecified, not intractable, without status migrainosus: Secondary | ICD-10-CM | POA: Diagnosis not present

## 2016-01-18 DIAGNOSIS — M5030 Other cervical disc degeneration, unspecified cervical region: Secondary | ICD-10-CM | POA: Diagnosis not present

## 2016-01-18 DIAGNOSIS — M542 Cervicalgia: Secondary | ICD-10-CM | POA: Diagnosis not present

## 2016-01-18 DIAGNOSIS — M19019 Primary osteoarthritis, unspecified shoulder: Secondary | ICD-10-CM | POA: Diagnosis not present

## 2016-01-18 DIAGNOSIS — Z9889 Other specified postprocedural states: Secondary | ICD-10-CM | POA: Diagnosis not present

## 2016-01-18 DIAGNOSIS — T847XXD Infection and inflammatory reaction due to other internal orthopedic prosthetic devices, implants and grafts, subsequent encounter: Secondary | ICD-10-CM

## 2016-01-18 DIAGNOSIS — M533 Sacrococcygeal disorders, not elsewhere classified: Secondary | ICD-10-CM | POA: Diagnosis not present

## 2016-01-18 DIAGNOSIS — M19011 Primary osteoarthritis, right shoulder: Secondary | ICD-10-CM

## 2016-01-18 DIAGNOSIS — M5481 Occipital neuralgia: Secondary | ICD-10-CM | POA: Insufficient documentation

## 2016-01-18 DIAGNOSIS — M19012 Primary osteoarthritis, left shoulder: Secondary | ICD-10-CM

## 2016-01-18 DIAGNOSIS — G43109 Migraine with aura, not intractable, without status migrainosus: Secondary | ICD-10-CM

## 2016-01-18 DIAGNOSIS — M62830 Muscle spasm of back: Secondary | ICD-10-CM | POA: Insufficient documentation

## 2016-01-18 DIAGNOSIS — M5416 Radiculopathy, lumbar region: Secondary | ICD-10-CM | POA: Diagnosis not present

## 2016-01-18 DIAGNOSIS — M5134 Other intervertebral disc degeneration, thoracic region: Secondary | ICD-10-CM

## 2016-01-18 DIAGNOSIS — M5137 Other intervertebral disc degeneration, lumbosacral region: Secondary | ICD-10-CM

## 2016-01-18 MED ORDER — OXYCODONE HCL 15 MG PO TABS: ORAL_TABLET | ORAL | Status: DC

## 2016-01-18 MED ORDER — TIZANIDINE HCL 4 MG PO TABS: ORAL_TABLET | ORAL | Status: DC

## 2016-01-18 NOTE — Patient Instructions (Addendum)
PLAN    Continue present medications. tizanidine and oxycodone  Shoulder injection to be performed at time of return appointment  F/U PCP  Dr Richarda Overlie for evaliation of  BP and general medical  condition.. Discuss steroid Dosepak for treatment of your pain of the neck and upper extremities with Dr. Richarda Overlie and Dr.Cram  F/U surgical evaluation. Follow-up with Dr. [name] as discussed to review cervical MRI and to discuss treatment plan . CT scan of right shoulder as discussed and continue exercises and physical therapy as instructed  F/U Dr Clayborn Bigness   Recommend deep myofascial massage/release 2 times per week with caution to avoid aggravation of symptoms   F/U neurological evaluation.  May consider radiofrequency rhizolysis or intraspinal procedures pending response to present treatment and F/U evaluation.  Patient to call [facility] should patient have concerns prior to scheduled return appointment.Place shoulder pain patient instructions here.

## 2016-01-18 NOTE — Progress Notes (Signed)
Subjective:    Patient ID: Marissa [name], female    DOB: February 16, 1976, 40 y.o.   MRN: [id number]  HPI  The patient is a 40 year old female who returns to pain management for further evaluation and treatment of pain involving the region of the neck headaches upper extremities especially the shoulder mid back lower back and lower extremity regions. As present time patient has severe pain involving the region of the right shoulder The patient is discussed pain of the shoulder with Dr.Cram. At the present time patient is involved in physical therapy and exercises and additional studies of the shoulder as well as cervical region are to be obtained and reviewed. The patient is a prior surgery of the cervical region and lumbar regions. At the present time patient states that the pain is severely incapacitating and interferes with ability to perform activities of daily living including reaching and lifting pushing pulling and ability to pain restful sleep. We discussed patient's condition and we will consider patient for steroid injection of the right shoulder pending medical clearance by Dr. Richarda Overlie and follow-up evaluation of patient at time return appointment. The patient will continue times tizanidine and oxycodone as prescribed at this time.. All agreed to suggested treatment plan     Review of Systems     Objective:   Physical Exam  There was tenderness to palpation of the splenius capitis and occipitalis regions palpation which reproduces moderate discomfort with well-healed surgical scar of the cervical region without increased warmth and erythema in the region of the scar. There was limited range of motion of the cervical spine. The patient appeared to be with unremarkable Spurling's maneuver. Palpation of the acromioclavicular and glenohumeral joint regions reproduced pain of severe degree for performed on the right side. There was limited range of motion of the right shoulder of severe  degree and patient was unable to ABduct The right upper extremity to 90.. There was decreased grip strength noted as well. Tinel and Phalen's maneuver were without increased pain of significant degree. Palpation over the thoracic region was with evidence of moderate muscle spasm without crepitus of the thoracic region noted. Palpation over the lumbar paraspinal musculatures and lumbar facet region was attends to palpation of moderate degree with lateral bending rotation extension and palpation of the lumbar facets reproducing moderate discomfort with moderate tenderness over the PSIS and PII S region as well as the gluteal and piriformis musculature regions. Straight leg raise was limited to approximately 20. There appeared to be decreased sensation of the L5 dermatomal distribution. EHL strength was decreased. The knees were tenderness to palpation with negative anterior and posterior drawer signs without ballottement of the patella. There was negative clonus negative Homans. Abdomen nontender with no costovertebral tenderness noted      Assessment & Plan:     Degenerative joint disease of shoulder  Degenerative disc disease of the cervical spine  Status post surgical intervention of the cervical region  Cervical radiculopathy  Cervical facet syndrome  Greater occipital neuralgia  Migraine  Degenerative disc disease of the cervical spine  Status post surgical intervention of the cervical region      PLAN    Continue present medications. tizanidine and oxycodone  Shoulder injection to be performed at time of return appointment  F/U PCP  Dr Richarda Overlie for evaliation of  BP and general medical condition  F/U surgical evaluation. Follow-up with Dr. [name] as discussed to review cervical MRI and to discuss treatment plan . CT scan of  right shoulder as discussed and continue exercises and physical therapy as instructed  F/U Dr Clayborn Bigness   Recommend deep myofascial massage/release  2 times per week with caution to avoid aggravation of symptoms . Patient will attempt to perform pendulum exercises for pain involving the right shoulder  F/U neurological evaluation.  May consider radiofrequency rhizolysis or intraspinal procedures pending response to present treatment and F/U evaluation.  Patient to call Pain Management Center should patient have concerns prior to scheduled return appointment.

## 2016-01-18 NOTE — Progress Notes (Signed)
Safety precautions to be maintained throughout the outpatient stay will include: orient to surroundings, keep bed in low position, maintain call bell within reach at all times, provide assistance with transfer out of bed and ambulation.  

## 2016-01-19 ENCOUNTER — Ambulatory Visit: Payer: PPO

## 2016-01-20 ENCOUNTER — Encounter: Payer: PPO | Admitting: Physical Therapy

## 2016-01-24 ENCOUNTER — Ambulatory Visit: Payer: PPO | Admitting: Physical Therapy

## 2016-01-25 ENCOUNTER — Encounter: Payer: PPO | Admitting: Physical Therapy

## 2016-01-26 ENCOUNTER — Ambulatory Visit: Payer: PPO | Admitting: Physical Therapy

## 2016-01-26 DIAGNOSIS — E113293 Type 2 diabetes mellitus with mild nonproliferative diabetic retinopathy without macular edema, bilateral: Secondary | ICD-10-CM | POA: Diagnosis not present

## 2016-01-27 ENCOUNTER — Encounter: Payer: PPO | Admitting: Physical Therapy

## 2016-01-27 ENCOUNTER — Ambulatory Visit
Admission: RE | Admit: 2016-01-27 | Discharge: 2016-01-27 | Disposition: A | Discharge status: Home / Self Care | Payer: PPO | Source: Ambulatory Visit | Attending: Unknown Physician Specialty | Admitting: Unknown Physician Specialty

## 2016-01-27 DIAGNOSIS — M67813 Other specified disorders of tendon, right shoulder: Secondary | ICD-10-CM | POA: Diagnosis not present

## 2016-01-27 DIAGNOSIS — M19011 Primary osteoarthritis, right shoulder: Secondary | ICD-10-CM | POA: Diagnosis not present

## 2016-01-27 DIAGNOSIS — G8929 Other chronic pain: Secondary | ICD-10-CM

## 2016-01-27 DIAGNOSIS — M25511 Pain in right shoulder: Secondary | ICD-10-CM | POA: Diagnosis not present

## 2016-01-27 DIAGNOSIS — M75121 Complete rotator cuff tear or rupture of right shoulder, not specified as traumatic: Secondary | ICD-10-CM | POA: Diagnosis not present

## 2016-01-27 DIAGNOSIS — M25411 Effusion, right shoulder: Secondary | ICD-10-CM | POA: Diagnosis not present

## 2016-01-31 ENCOUNTER — Encounter: Payer: PPO | Admitting: Physical Therapy

## 2016-02-01 DIAGNOSIS — M75121 Complete rotator cuff tear or rupture of right shoulder, not specified as traumatic: Secondary | ICD-10-CM | POA: Insufficient documentation

## 2016-02-02 DIAGNOSIS — R300 Dysuria: Secondary | ICD-10-CM | POA: Diagnosis not present

## 2016-02-02 DIAGNOSIS — N390 Urinary tract infection, site not specified: Secondary | ICD-10-CM | POA: Diagnosis not present

## 2016-02-03 ENCOUNTER — Ambulatory Visit: Payer: PPO

## 2016-02-07 NOTE — Discharge Instructions (Signed)

## 2016-02-08 ENCOUNTER — Encounter: Payer: Self-pay | Admitting: *Deleted

## 2016-02-09 ENCOUNTER — Encounter: Payer: Self-pay | Admitting: Anesthesiology

## 2016-02-10 DIAGNOSIS — I509 Heart failure, unspecified: Secondary | ICD-10-CM | POA: Diagnosis not present

## 2016-02-10 DIAGNOSIS — R0602 Shortness of breath: Secondary | ICD-10-CM | POA: Diagnosis not present

## 2016-02-10 DIAGNOSIS — F329 Major depressive disorder, single episode, unspecified: Secondary | ICD-10-CM | POA: Diagnosis not present

## 2016-02-10 DIAGNOSIS — Z01818 Encounter for other preprocedural examination: Secondary | ICD-10-CM | POA: Diagnosis not present

## 2016-02-10 DIAGNOSIS — I10 Essential (primary) hypertension: Secondary | ICD-10-CM | POA: Diagnosis not present

## 2016-02-10 DIAGNOSIS — R600 Localized edema: Secondary | ICD-10-CM | POA: Diagnosis not present

## 2016-02-10 DIAGNOSIS — E119 Type 2 diabetes mellitus without complications: Secondary | ICD-10-CM | POA: Diagnosis not present

## 2016-02-10 DIAGNOSIS — I2510 Atherosclerotic heart disease of native coronary artery without angina pectoris: Secondary | ICD-10-CM | POA: Diagnosis not present

## 2016-02-10 DIAGNOSIS — E784 Other hyperlipidemia: Secondary | ICD-10-CM | POA: Diagnosis not present

## 2016-02-10 DIAGNOSIS — I429 Cardiomyopathy, unspecified: Secondary | ICD-10-CM | POA: Diagnosis not present

## 2016-02-10 DIAGNOSIS — I428 Other cardiomyopathies: Secondary | ICD-10-CM | POA: Diagnosis not present

## 2016-02-10 DIAGNOSIS — M545 Low back pain: Secondary | ICD-10-CM | POA: Diagnosis not present

## 2016-02-11 ENCOUNTER — Ambulatory Visit: Admission: RE | Admit: 2016-02-11 | Payer: PPO | Source: Ambulatory Visit | Admitting: Unknown Physician Specialty

## 2016-02-11 HISTORY — DX: Sepsis, unspecified organism: A41.9

## 2016-02-11 SURGERY — ARTHROSCOPY, SHOULDER
Anesthesia: Choice | Laterality: Right

## 2016-02-17 ENCOUNTER — Other Ambulatory Visit: Payer: PPO

## 2016-02-17 ENCOUNTER — Ambulatory Visit: Payer: PPO | Admitting: Pain Medicine

## 2016-02-17 NOTE — Patient Instructions (Signed)
  Your procedure is scheduled on: 02-23-16 [facility]) Report to Same Day Surgery 2nd floor medical mall To find out your arrival time please call [phone] between 1PM - 3PM on 02-22-16(TUESDAY)  Remember: Instructions that are not followed completely may result in serious medical risk, up to and including death, or upon the discretion of your surgeon and anesthesiologist your surgery may need to be rescheduled.    _x___ 1. Do not eat food or drink liquids after midnight. No gum chewing or hard candies.     __x__ 2. No Alcohol for 24 hours before or after surgery.   __x__3. No Smoking for 24 prior to surgery.   ____  4. Bring all medications with you on the day of surgery if instructed.    __x__ 5. Notify your doctor if there is any change in your medical condition     (cold, fever, infections).     Do not wear jewelry, make-up, hairpins, clips or nail polish.  Do not wear lotions, powders, or perfumes. You may wear deodorant.  Do not shave 48 hours prior to surgery. Men may shave face and neck.  Do not bring valuables to the hospital.    [facility] is not responsible for any belongings or valuables.               Contacts, dentures or bridgework may not be worn into surgery.  Leave your suitcase in the car. After surgery it may be brought to your room.  For patients admitted to the hospital, discharge time is determined by your treatment team.   Patients discharged the day of surgery will not be allowed to drive home.    Please read over the following fact sheets that you were given:   [facility] Preparing for Surgery and or MRSA Information   _x___ Take these medicines the morning of surgery with A SIP OF WATER:    1. COREG (CARVEDILOL)  2. CLONIDINE (CATAPRESS)  3. GABAPENTIN (NEURONTIN)  4.  5.  6.  ____ Fleet Enema (as directed)   _x___ Use CHG Soap or sage wipes as directed on instruction sheet   ____ Use inhalers on the day of surgery and bring to hospital  day of surgery  _X___ Stop metformin 2 days prior to surgery-LAST DOSE ON 02-20-16 (SUNDAY)    _X___ Take 1/2 of usual insulin dose the night before surgery and none on the morning of surgery.   ____ Stop aspirin or coumadin, or plavix  _x__ Stop Anti-inflammatories such as Advil, Aleve, Ibuprofen, Motrin, Naproxen,          Naprosyn, Goodies powders or aspirin products. Ok to take Tylenol.   ____ Stop supplements until after surgery.    ____ Bring C-Pap to the hospital.

## 2016-02-17 NOTE — Pre-Procedure Instructions (Signed)
Result Narrative                      CARDIOLOGY DEPARTMENT                        Sees, [place] #: [id number]         [address] [place], [place] [address]             Date: 10/07/2014 10:23 AM                                                                  Adult Female Age: 40 yrs                     ECHOCARDIOGRAM REPORT                        Outpatient                                                                  KC::KCWC          STUDY:CHEST WALL         TAPE:0000:00: 0:00:00          MD1:  K. LINTHAVONG           ECHO:Yes   DOPPLER:Yes  FILE:0000-000-000              BP: 142/88 mmHg          COLOR:Yes  CONTRAST:No       MACHINE:Philips            HR: 90      RV BIOPSY:No         3D:No    SOUND QLTY:Moderate           Height: 64 in         MEDIUM:None                                              Weight: 233 lb  BSA: 2.1 m2  ___________________________________________________________________________________________                  HISTORY:DOE, CHF                   REASON:Assess, LV function  ___________________________________________________________________________________________ ECHOCARDIOGRAPHIC MEASUREMENTS 2D DIMENSIONS AORTA             Values      Normal Range      MAIN PA          Values      Normal Range           Annulus:  nm*       [2.1 - 2.5]                PA Main:  nm*       [1.5 - 2.1]         Aorta Sin:  nm*       [2.7 - 3.3]       RIGHT VENTRICLE       ST Junction:  nm*       [2.3 - 2.9]                RV Base:  nm*       [ < 4.2]         Asc.Aorta:  nm*       [2.3 - 3.1]                 RV Mid:  nm*       [ < 3.5]  LEFT VENTRICLE                                        RV Length:  nm*       [ < 8.6]             LVIDd:  4.6 cm    [3.9 - 5.3]        INFERIOR VENA CAVA             LVIDs:  3.6 cm                              Max. IVC:  nm*       [ <= 2.1]                FS:  22.2 %    [> 25]                    Min. IVC:  nm*               SWT:  1.2 cm    [0.5 - 0.9]                   ------------------               PWT:  1.1 cm    [0.5 - 0.9]                   nm* - not measured  LEFT ATRIUM           LA Diam:  3.7 cm    [2.7 - 3.8]       LA A4C Area:  nm*       [ < 20]  LA Volume:  nm*       [22 - 52]  ___________________________________________________________________________________________ ECHOCARDIOGRAPHIC DESCRIPTIONS  AORTIC ROOT         Size:Normal   Dissection:No dissection  AORTIC VALVE     Leaflets:Tricuspid             Morphology:Normal     Mobility:Fully mobile  LEFT VENTRICLE         Size:Normal                  Anterior:HYPOCONTRACTILE  Contraction:MILD GLOBAL DECREASE     Lateral:HYPOCONTRACTILE   Closest EF:45% (Estimated)           Septal:HYPOCONTRACTILE    LV Masses:No Masses                 Apical:Normal          [id number] LVH                Inferior:Normal                                     Posterior:Normal Dias.FxClass:N/A  MITRAL VALVE     Leaflets:Normal                  Mobility:Fully mobile   Morphology:Normal  LEFT ATRIUM         Size:Normal                 LA Masses:No masses    IA Septum:Normal IAS  MAIN PA         Size:Normal  PULMONIC VALVE   Morphology:Normal                  Mobility:Fully mobile  RIGHT VENTRICLE    RV Masses:No Masses                   Size:Normal    Free Wall:Normal               Contraction:Normal  TRICUSPID VALVE     Leaflets:Normal                  Mobility:Fully mobile   Morphology:Normal  RIGHT ATRIUM         Size:Normal                  RA Other:None      RA Mass:No masses  PERICARDIUM        Fluid:MILD EFFUSION  INFERIOR VENACAVA         Size:Not seen Not  Seen   ___________________________________________________________________ DOPPLER ECHO and OTHER SPECIAL PROCEDURES    Aortic:No AR                         No AS           114.0 cm/sec peak vel         5.2 mmHg peak grad           3.0 mmHg mean grad            1.7 cm^2 by DOPPLER     Mitral:MILD MR                       No MS           88.8 cm/sec peak vel          3.2  mmHg peak grad           1.0 mmHg mean grad            2.1 cm^2 by DOPPLER           MV Inflow E Vel=65.6 cm/sec   MV Annulus E'Vel=nm*           E/E'Ratio=nm*  Tricuspid:MODERATE TR                   No TS           282.0 cm/sec peak TR vel      41.8 mmHg peak RV pressure  Pulmonary:MILD PR                       No PS    ___________________________________________________________________________________________  INTERPRETATION NORMAL RIGHT VENTRICULAR SYSTOLIC FUNCTION NO VALVULAR REGURGITATION NO VALVULAR STENOSIS Mildly reduced left ventricular function Hypokinesis to anterior apical septal and lateral wall Overall ejection fraction is low-normal between 45-50%   ___________________________________________________________________________________________ Electronically signed by: Lujean Amel, MD on 10/12/2014 12:45 PM             Performed By: Rennis Chris, RDCS, RVT       Ordering Physician: Lujean Amel ___________________________________________________________________________________________   Status Results Details   Encounter Summary  2015 December

## 2016-02-17 NOTE — Pre-Procedure Instructions (Signed)
RECEIVED CARDIAC CLEARANCE FROM DR CALLWOOD, MODERATE RISK-NOTE IN [facility] EPIC FROM 02-10-16 AND CLEARANCE NOTE ON CHART

## 2016-02-17 NOTE — Pre-Procedure Instructions (Signed)
Jobe Gibbon, MD - 02/10/2016 11:15 AM EDT Formatting of this note may be different from the original. Established Patient Visit   Chief Complaint: Chief Complaint  Patient presents with  . Pre-op Exam  re-eval bp  Date of Service: 02/10/2016 Date of Birth: Apr 01, 1976 PCP: [name], MD  History of Present Illness: Ms. Fazzino is a 40 y.o.female patient who for surgery history of congestive heart failure systolic dysfunction cardiomyopathy obesity diabetes who was scheduled for surgery but her blood pressure was slightly elevated so surgery was canceled and she was referred back to cardiology for therapy. Patient scheduled to have shoulder surgery but again her blood pressure was slightly elevated so she is now here for adjustments with maintain lisinopril Coreg spironolactone but plan is to increase Coreg for now. Has had compensated heart failure diabetes is been reasonably controlled she is followed by Dr. [name]. Should be an acceptable surgical risk for her shoulder surgery since her blood pressures much better under control now with increase in clonidine.  Past Medical and Surgical History  Past Medical History Past Medical History:  Diagnosis Date  . Antibiotic long-term use  . Cervical high risk HPV (human papillomavirus) test positive  ECC 08/05/2014 = Scant acellular debris  . Depression, unspecified 01/13/2014  . Diabetes type 2, controlled (CMS-HCC) 01/13/2014  . Difficulty speaking 01/13/2014  . Headache 01/13/2014  . History of mammogram 04/24/13  . Hypertension 01/13/2014  . Nonischemic cardiomyopathy (CMS-HCC)  . Papanicolaou smear 03/27/12   Past Surgical History She has a past surgical history that includes other surgery (05/2010 and 04/2013, 10/2013); Oophorectomy (Bilateral); Lumbar surgery x 3; C-Spine surgery (2006); Back surgery (2011, 2014, 10/2013, 01/2014); Back surgeryX3 (June/July 2015); Cholecystectomy (04/08/15); and Colposcopy (08/05/2014).   Medications  and Allergies  Current Medications  Current Outpatient Prescriptions  Medication Sig Dispense Refill  . amitriptyline (ELAVIL) 50 MG tablet Take 50 mg by mouth nightly.  Marland Kitchen aspirin 81 MG EC tablet Take 81 mg by mouth once daily.  . blood glucose diagnostic test strip Use 3 (three) times daily. Use as instructed. 100 each 3  . blood-glucose meter Misc 1 each by XX route as directed. 1 each 0  . carvedilol (COREG) 25 MG tablet Take 25 mg by mouth 2 (two) times daily. 0  . FUROsemide (LASIX) 20 MG tablet Take 1 tablet (20 mg total) by mouth once daily. 30 tablet 5  . gabapentin (NEURONTIN) 300 MG capsule Take 2 capsule by mouth three times daily as directed. 540 capsule 1  . insulin ASPART (NOVOLOG) 100 unit/mL injection Use up to 56 units per day in the VGO pump as directed. 20 mL 5  . lancets Use 1 each 3 (three) times daily. Use as instructed. 100 each 3  . lisinopril (PRINIVIL,ZESTRIL) 40 MG tablet Take 40 mg by mouth once daily. 30 tablet 1  . meloxicam (MOBIC) 15 MG tablet Take 1 tablet (15 mg total) by mouth once daily as needed. Take daily PRN with food for left ear pain and headache 21 tablet 0  . metFORMIN (GLUMETZA) 500 MG (MOD) ER tablet Take 1 tablet (500 mg total) by mouth daily with dinner. 30 tablet 3  . nitrofurantoin, macrocrystal-monohydrate, (MACROBID) 100 MG capsule Take 1 capsule (100 mg total) by mouth 2 (two) times daily. 14 capsule 0  . nitroGLYcerin (NITROSTAT) 0.4 MG SL tablet Place 0.4 mg under the tongue every 5 (five) minutes as needed for Chest pain. May take up to 3 doses.  Marland Kitchen  NOVOLOG FLEXPEN pen injector (concentration 100 units/mL) inject subcutaneously 6 units twice a day 15 Syringe 1  . oxyCODONE (ROXICODONE) 15 MG immediate release tablet Take 15 mg by mouth every 6 (six) hours as needed for Pain.  . potassium chloride (KLOR-CON) 10 MEQ ER tablet Take 1 tablet (10 mEq total) by mouth once daily. 30 tablet 5  . spironolactone (ALDACTONE) 25 MG tablet Take 0.5  tablets (12.5 mg total) by mouth once daily. 30 tablet 3  . sub-q insulin device, 20 unit (VGO 20) Devi Use 20 Units once daily. 30 Device 5  . TANZEUM 50 mg/0.5 mL PnIj inject 50 milligram subcutaneously every week 4 each 3  . tiZANidine (ZANAFLEX) 4 MG capsule Take 1 capsule (4 mg total) by mouth 3 (three) times daily as needed for Muscle spasms. 0  . topiramate (TOPAMAX) 100 MG tablet 1 tab by mouth daily (Patient taking differently: Take 150 mg by mouth once daily. ) 5  . cloNIDine HCl (CATAPRES) 0.2 MG tablet Take 1 tablet (0.2 mg total) by mouth 2 (two) times daily. 60 tablet 5   No current facility-administered medications for this visit.   Allergies: Azasite [azithromycin]; Ciprofloxacin; Rocephin [ceftriaxone]; Sulfamethoxazole-trimethoprim; Vancomycin; and Other  Social and Family History  Social History reports that she has never smoked. She has never used smokeless tobacco. She reports that she does not drink alcohol or use illicit drugs.  Family History Family History  Problem Relation Age of Onset  . Diabetes mellitus Father  . Stroke Father  . Heart disease Father  . Diabetes type II Father  . Obesity Father  . No Known Problems Sister  . Other Brother  Was murdered  . No Known Problems Sister  . Hypertension Mother  . Migraines Mother  . Diabetes mellitus Paternal Grandfather   Review of Systems   Review of Systems: The patient denies chest pain, shortness of breath, orthopnea, paroxysmal nocturnal dyspnea, pedal edema, palpitations, heart racing, presyncope, syncope. Review of 12 Systems is negative except as described above.  Physical Examination   Vitals:BP (!) 130/90  Pulse 84  Ht 163.8 cm (5' 4.5")  Wt 89.8 kg (198 lb)  SpO2 99%  BMI 33.46 kg/m2 Ht:163.8 cm (5' 4.5") Wt:89.8 kg (198 lb) [id number] surface area is 2.02 meters squared. Body mass index is 33.46 kg/(m^2).  HEENT: Pupils equally reactive to light and accomodation  Neck: Supple without  thyromegaly, carotid pulses 2+ Lungs: clear to auscultation bilaterally; no wheezes, rales, rhonchi Heart: Regular rate and rhythm. S4/S3 gallops, 2/6 sem murmurs or rub Abdomen: soft nontender, nondistended, with normal bowel sounds Extremities: no cyanosis, clubbing, or edema Peripheral Pulses: 2+ in all extremities, 2+ femoral pulses bilaterally  Assessment   40 y.o. female with  1. Essential hypertension  2. CHF with cardiomyopathy (CMS-HCC)  3. Cardiomyopathy, nonischemic (CMS-HCC)  4. SOB (shortness of breath)  5. Other hyperlipidemia  6. Controlled type 2 diabetes mellitus without complication, without long-term current use of insulin (CMS-HCC)  7. CAD in native artery  8. Bilateral edema of lower extremity  9. Depression, unspecified depression type  10. Chronic low back pain, unspecified back pain laterality, with sciatica presence unspecified  11. Preoperative clearance   Plan  1 Pre op clearance elevated blood pressure adjustments in medication increase and clonidine is 0.2 twice a day has improved her blood pressure she should be an acceptable surgical risk 2 hypertension difficult to control elevated diastolic blood pressure will adjust antihypertensives continue clonidine spironolactone lisinopril increase clonidine  to 0.2 twice a day 3 nonischemic cardiomyopathy with systolic dysfunction compensated continue lisinopril continue Coreg continue spironolactone continue Lasix therapy 4 diabetes type 2 uncomplicated continue insulin NovoLog and metformin 5 obesity recommend weight loss exercise portion control 6 DJD continue meloxicam for arthritic symptoms 7 have the patient follow-up in 6 months  Return in about 6 months (around 08/11/2016).  Yolonda Kida, MD    Plan of Treatment - as of this encounter Upcoming Encounters Upcoming Encounters  Date Type Specialty Care Team Description  03/02/2016 Post Op Orthopaedics Debera Lat., MD  [address]  [place], [place] [address]  [phone]  [phone] ([phone]    03/14/2016 Initial consult Neurology Vickki Hearing, MD  [place]  [facility]  [place], [place] [address]  [phone]  [phone] (Fax)    04/12/2016 Ancillary Orders Lab Verne Carrow, MD  [place]  [facility]  [place], [place] [address]  [phone]  [phone] (Fax)    04/18/2016 Office Visit Endocrinology Adella Hare [place], MD  [place]  [facility]  [place], [place] [address]  [phone]  [phone] (Fax)    04/19/2016 Office Visit Cardiology [place], Montey Hora, MD  [facility]  [place], [place] [address]  [phone]  [phone] (Fax)    Visit Diagnoses - in this encounter Diagnosis  Essential hypertension - Primary  CHF with cardiomyopathy (CMS-HCC)  Cardiomyopathy, nonischemic (CMS-HCC)  Other primary cardiomyopathies   SOB (shortness of breath)  Shortness of breath   Other hyperlipidemia  Controlled type 2 diabetes mellitus without complication, without long-term current use of insulin (CMS-HCC)  CAD in native artery  Bilateral edema of lower extremity  Depression, unspecified depression type  Chronic low back pain, unspecified back pain laterality, with sciatica presence unspecified  Preoperative clearance  Unspecified pre-operative examination   Discontinued Medications - as of this encounter Prescription Sig. Discontinue Reason Start Date End Date  cloNIDine HCl (CATAPRES) 0.1 MG tablet  Take 1 tablet (0.1 mg total) by mouth 2 (two) times daily. Dose adjustment 02/02/2016 02/10/2016  Document Information Service Providers Document Coverage Dates Jun. 29, 2017 - Jul. 04, 2017 [employer] [phone] (Work) [place], [place] [address] Encounter Providers Dwayne Aida Raider MD (Attending) [phone] (Work) [phone] (Fax)  [place], [place] [address]

## 2016-02-18 ENCOUNTER — Telehealth: Payer: Self-pay | Admitting: *Deleted

## 2016-02-18 ENCOUNTER — Encounter: Payer: Self-pay | Admitting: Pain Medicine

## 2016-02-18 ENCOUNTER — Ambulatory Visit: Payer: PPO | Attending: Pain Medicine | Admitting: Pain Medicine

## 2016-02-18 VITALS — BP 177/106 | HR 87 | Temp 98.3°F | Resp 16 | Ht 64.5 in | Wt 197.0 lb

## 2016-02-18 DIAGNOSIS — M533 Sacrococcygeal disorders, not elsewhere classified: Secondary | ICD-10-CM | POA: Diagnosis not present

## 2016-02-18 DIAGNOSIS — M19011 Primary osteoarthritis, right shoulder: Secondary | ICD-10-CM

## 2016-02-18 DIAGNOSIS — E1340 Other specified diabetes mellitus with diabetic neuropathy, unspecified: Secondary | ICD-10-CM

## 2016-02-18 DIAGNOSIS — M19019 Primary osteoarthritis, unspecified shoulder: Secondary | ICD-10-CM | POA: Diagnosis not present

## 2016-02-18 DIAGNOSIS — M5416 Radiculopathy, lumbar region: Secondary | ICD-10-CM | POA: Diagnosis not present

## 2016-02-18 DIAGNOSIS — M5137 Other intervertebral disc degeneration, lumbosacral region: Secondary | ICD-10-CM

## 2016-02-18 DIAGNOSIS — M7590 Shoulder lesion, unspecified, unspecified shoulder: Secondary | ICD-10-CM | POA: Insufficient documentation

## 2016-02-18 DIAGNOSIS — M47817 Spondylosis without myelopathy or radiculopathy, lumbosacral region: Secondary | ICD-10-CM | POA: Diagnosis not present

## 2016-02-18 DIAGNOSIS — M5481 Occipital neuralgia: Secondary | ICD-10-CM

## 2016-02-18 DIAGNOSIS — M546 Pain in thoracic spine: Secondary | ICD-10-CM | POA: Diagnosis not present

## 2016-02-18 DIAGNOSIS — M5010 Cervical disc disorder with radiculopathy, unspecified cervical region: Secondary | ICD-10-CM | POA: Insufficient documentation

## 2016-02-18 DIAGNOSIS — M75100 Unspecified rotator cuff tear or rupture of unspecified shoulder, not specified as traumatic: Secondary | ICD-10-CM | POA: Insufficient documentation

## 2016-02-18 DIAGNOSIS — M19012 Primary osteoarthritis, left shoulder: Secondary | ICD-10-CM

## 2016-02-18 DIAGNOSIS — M5030 Other cervical disc degeneration, unspecified cervical region: Secondary | ICD-10-CM

## 2016-02-18 DIAGNOSIS — M5126 Other intervertebral disc displacement, lumbar region: Secondary | ICD-10-CM

## 2016-02-18 DIAGNOSIS — Z9889 Other specified postprocedural states: Secondary | ICD-10-CM | POA: Diagnosis not present

## 2016-02-18 DIAGNOSIS — M5134 Other intervertebral disc degeneration, thoracic region: Secondary | ICD-10-CM

## 2016-02-18 DIAGNOSIS — G43109 Migraine with aura, not intractable, without status migrainosus: Secondary | ICD-10-CM

## 2016-02-18 DIAGNOSIS — M542 Cervicalgia: Secondary | ICD-10-CM | POA: Diagnosis not present

## 2016-02-18 MED ORDER — OXYCODONE HCL 15 MG PO TABS: ORAL_TABLET | ORAL | Status: DC

## 2016-02-18 MED ORDER — TIZANIDINE HCL 4 MG PO TABS: ORAL_TABLET | ORAL | Status: DC

## 2016-02-18 NOTE — Patient Instructions (Addendum)
PLAN    Continue present medications. tizanidine and oxycodone  F/U PCP  Dr Richarda Overlie for evaliation of  BP and general medical  condition.. Blood pressure is elevated. Please see Dr. Richarda Overlie or cardiologist today for evaluation of elevated blood pressure   F/U surgical evaluation. Follow-up with Dr. [name] as discussed   Surgery of right shoulder by Dr.  Franchot Mimes as planned    F/U Dr Clayborn Bigness   Recommend deep myofascial massage/release 2 times per week with caution to avoid aggravation of symptoms   F/U neurological evaluation. May consider [facility] EMG studies and other studies. We will avoid such studies at this time  May consider radiofrequency rhizolysis or intraspinal procedures pending response to present treatment and F/U evaluation. We will avoid such studies at this time  Patient to call Pain Management Center should patient have concerns prior to scheduled return appointment.

## 2016-02-18 NOTE — Progress Notes (Signed)
Safety precautions to be maintained throughout the outpatient stay will include: orient to surroundings, keep bed in low position, maintain call bell within reach at all times, provide assistance with transfer out of bed and ambulation.  

## 2016-02-18 NOTE — Telephone Encounter (Signed)
Pt is aware that I placed the order for Dr. Richarda Overlie in [place] box...td

## 2016-02-18 NOTE — Progress Notes (Signed)
   Subjective:    Patient ID: Marissa [name], female    DOB: 05-14-1976, 39 y.o.   MRN: [id number]  HPI  Patient is a 40 year old female who returns to pain management for further evaluation and treatment of pain involving the region of the neck and shoulder entire back upper and lower extremity regions. The patient is to undergo surgery of the right shoulder with Dr. Franchot Mimes as planned. The patient is without plans for additional surgery of the cervical and lumbar regions. At the present time we'll continue Zanaflex and oxycodone and we will remain available to modified treatment regimen as needed. All agreed to suggested treatment plan.     Review of Systems     Objective:   Physical Exam  There was tenderness of the splenius capitis and occipitalis region a moderate degree. There were no new masses of the head and neck noted. Palpation of the acromioclavicular and glenohumeral joint regions reproduced pain of severe degree on the right compared to the left. The patient was with right upper extremity in sling. The patient was with decreased grip strength on the right compared to the left. There was tenderness to palpation over the thoracic facet thoracic paraspinal musculature region of moderately severe degree with no crepitus of the thoracic region noted. Palpation over this lumbar region was with moderate to moderately severe discomfort as well. There was tenderness over the PSIS and PII S regions. Straight leg raising was limited to 20 without a definite increased pain with dorsiflexion noted. EHL strength appeared to be decreased. There was questionably decreased sensation L5 dermatomal distribution. DTRs were difficult to elicit. There was negative clonus negative Homans. Abdomen nontender with no costovertebral tenderness noted      Assessment & Plan:     Severe appearing rotator cuff tendinopathy with a deep undersurface tear of the anterior and far lateral supraspinatus  measuring approximately 0.8 cm front to back with 1-2 cm of laminar retraction. No atrophy. Severe tendinopathy and possible longitudinal split tearing of the intra-articular segment of the long head of biceps. Advanced for age acromioclavicular osteoarthritis. Prominent subacromial/subdeltoid fluid consistent with bursitis.  Degenerative joint disease of shoulder  Degenerative disc disease of the cervical spine  Status post surgical intervention of the cervical region  Cervical radiculopathy  Cervical facet syndrome       PLAN    Continue present medications. tizanidine and oxycodone  F/U PCP  Dr Richarda Overlie for evaliation of  BP and general medical  condition.. Blood pressure is elevated. Please see Dr. Richarda Overlie or cardiologist today for evaluation of elevated blood pressure   F/U surgical evaluation. Follow-up with Dr. [name] as discussed   Surgery of right shoulder by Dr.  Franchot Mimes as planned    F/U Dr Clayborn Bigness   Recommend deep myofascial massage/release 2 times per week with caution to avoid aggravation of symptoms   F/U neurological evaluation. May consider [facility] EMG studies and other studies. We will avoid such studies at this time  May consider radiofrequency rhizolysis or intraspinal procedures pending response to present treatment and F/U evaluation. We will avoid such studies at this time  Patient to call [facility] should patient have concerns prior to scheduled return appointment.    Greater occipital neuralgia  Migraine  Degenerative disc disease of the cervical spine  Status post surgical intervention of the cervical region

## 2016-02-21 ENCOUNTER — Encounter
Admission: RE | Admit: 2016-02-21 | Discharge: 2016-02-21 | Disposition: A | Discharge status: Home / Self Care | Payer: PPO | Source: Ambulatory Visit | Attending: Unknown Physician Specialty | Admitting: Unknown Physician Specialty

## 2016-02-21 DIAGNOSIS — Z01810 Encounter for preprocedural cardiovascular examination: Secondary | ICD-10-CM | POA: Insufficient documentation

## 2016-02-21 DIAGNOSIS — Z01812 Encounter for preprocedural laboratory examination: Secondary | ICD-10-CM | POA: Diagnosis not present

## 2016-02-21 DIAGNOSIS — I10 Essential (primary) hypertension: Secondary | ICD-10-CM | POA: Diagnosis not present

## 2016-02-21 LAB — BASIC METABOLIC PANEL
ANION GAP: 5 (ref 5–15)
BUN: 10 mg/dL (ref 6–20)
CALCIUM: 8.9 mg/dL (ref 8.9–10.3)
CO2: 27 mmol/L (ref 22–32)
Chloride: 101 mmol/L (ref 101–111)
Creatinine, Ser: 0.9 mg/dL (ref 0.44–1.00)
GLUCOSE: 466 mg/dL — AB (ref 65–99)
Potassium: 4.7 mmol/L (ref 3.5–5.1)
Sodium: 133 mmol/L — ABNORMAL LOW (ref 135–145)

## 2016-02-21 LAB — CBC
HCT: 32.9 % — ABNORMAL LOW (ref 35.0–47.0)
Hemoglobin: 11.1 g/dL — ABNORMAL LOW (ref 12.0–16.0)
MCH: 29.6 pg (ref 26.0–34.0)
MCHC: 33.6 g/dL (ref 32.0–36.0)
MCV: 87.9 fL (ref 80.0–100.0)
PLATELETS: 142 10*3/uL — AB (ref 150–440)
RBC: 3.75 MIL/uL — ABNORMAL LOW (ref 3.80–5.20)
RDW: 13.8 % (ref 11.5–14.5)
WBC: 5.1 10*3/uL (ref 3.6–11.0)

## 2016-02-22 DIAGNOSIS — E1342 Other specified diabetes mellitus with diabetic polyneuropathy: Secondary | ICD-10-CM | POA: Diagnosis not present

## 2016-02-22 NOTE — Pre-Procedure Instructions (Signed)
CALLED DR Kayleen Memos REGARDING GLUCOSE OF 466-MEDICAL CLEARANCE NEEDED PER DR CARROLL-CALLED CINDY AT [facility] AND LEFT HER A MESSAGE-CALLED DR LINTHAVONGS OFFICE AND SPOKE WITH CONNIE-CONNIE TOOK DOWN ALL THE INFO AND IS AWARE THAT I AM FAXING CLEARANCE REQUEST OVER ALONG WITH LABS

## 2016-02-22 NOTE — Pre-Procedure Instructions (Signed)
RECEIVED FAX CONFIRMATION FROM BOTH DR LINTHAVONG AND CINDY THAT FAXES WERE RECEIVED

## 2016-02-23 ENCOUNTER — Encounter: Admission: RE | Payer: Self-pay | Source: Ambulatory Visit

## 2016-02-23 ENCOUNTER — Ambulatory Visit: Admission: RE | Admit: 2016-02-23 | Payer: PPO | Source: Ambulatory Visit | Admitting: Unknown Physician Specialty

## 2016-02-23 DIAGNOSIS — S4991XA Unspecified injury of right shoulder and upper arm, initial encounter: Secondary | ICD-10-CM | POA: Diagnosis not present

## 2016-02-23 DIAGNOSIS — M25511 Pain in right shoulder: Secondary | ICD-10-CM | POA: Diagnosis not present

## 2016-02-23 SURGERY — SHOULDER ARTHROSCOPY WITH ROTATOR CUFF REPAIR AND SUBACROMIAL DECOMPRESSION
Anesthesia: Choice | Laterality: Right

## 2016-02-24 DIAGNOSIS — M25511 Pain in right shoulder: Secondary | ICD-10-CM | POA: Diagnosis not present

## 2016-03-02 IMAGING — CR DG CHEST 1V PORT
1 series · 1 of 1 positions shown · non-contrast
Comparison: Chest radiograph performed 01/24/2014

CLINICAL DATA: Sepsis.

EXAM:
PORTABLE CHEST - 1 VIEW

[AP]
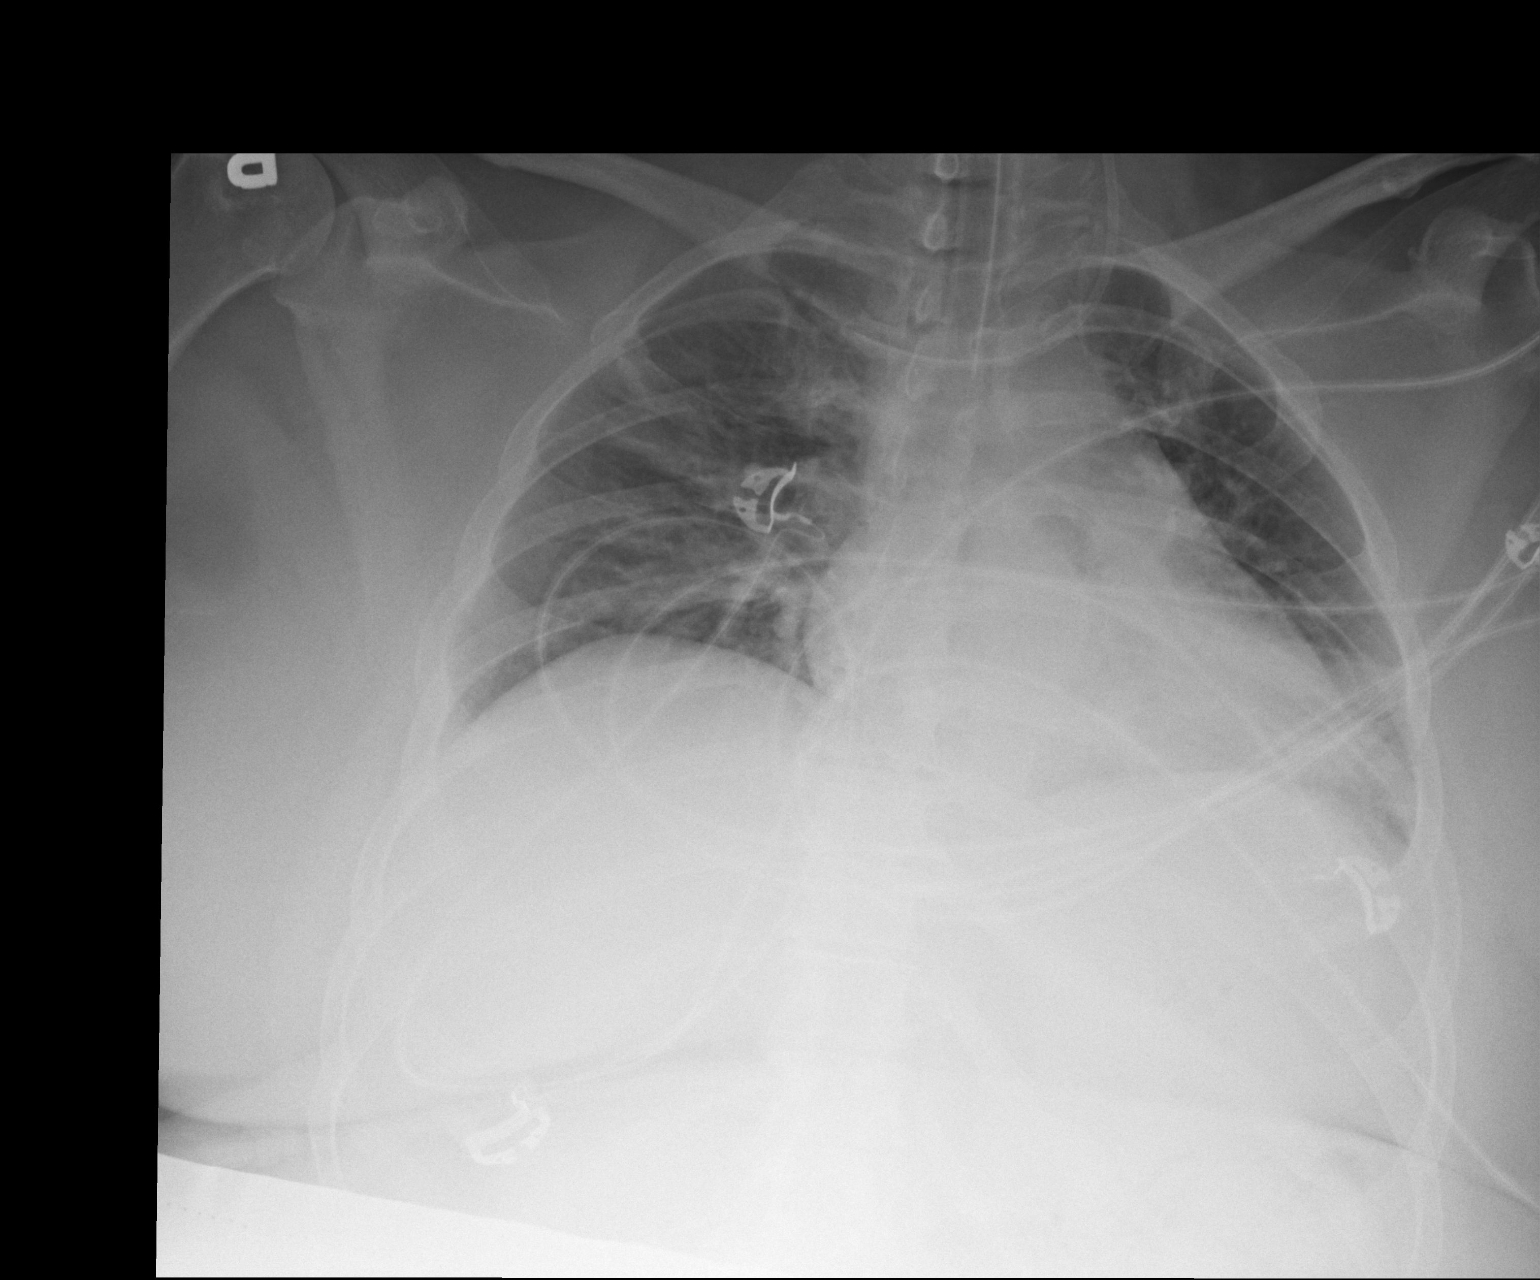

[1 of 1 positions shown; findings below may reference images not displayed]

FINDINGS: The patient's endotracheal tube is seen ending 1-2 cm above the
carina. This could be retracted by approximately 1 cm, as deemed
clinically appropriate.

A left IJ line is seen likely extending into an intercostal vessel;
would retract this by approximately 2 cm.

The lungs are hypoexpanded. Patchy bilateral airspace opacification
may reflect atelectasis or pneumonia. No definite pleural effusion
or pneumothorax is seen.

The cardiomediastinal silhouette is borderline normal in size. No
acute osseous abnormalities are identified. Cervical spinal fusion
hardware is partially imaged.
IMPRESSION: 1. Endotracheal tube seen ending 1-2 cm above the carina. This could
be retracted by approximately 1 cm, as deemed clinically
appropriate.
2. Left IJ line noted likely extending into an intercostal vessel;
would retract this by approximately 2 cm.
3. Lungs hypoexpanded. Patchy bilateral airspace opacification may
reflect atelectasis or pneumonia.

These results were called by telephone at the time of interpretation
on 01/25/2014 at [DATE] to Jurgent Jikia RN, who verbally
acknowledged these results.

## 2016-03-03 DIAGNOSIS — I509 Heart failure, unspecified: Secondary | ICD-10-CM | POA: Diagnosis not present

## 2016-03-03 DIAGNOSIS — R0602 Shortness of breath: Secondary | ICD-10-CM | POA: Diagnosis not present

## 2016-03-04 IMAGING — CR DG CHEST 1V PORT
1 series · 1 of 1 positions shown · non-contrast
Comparison: 01/26/2014

CLINICAL DATA: Pulmonary edema

EXAM:
PORTABLE CHEST - 1 VIEW

[AP]
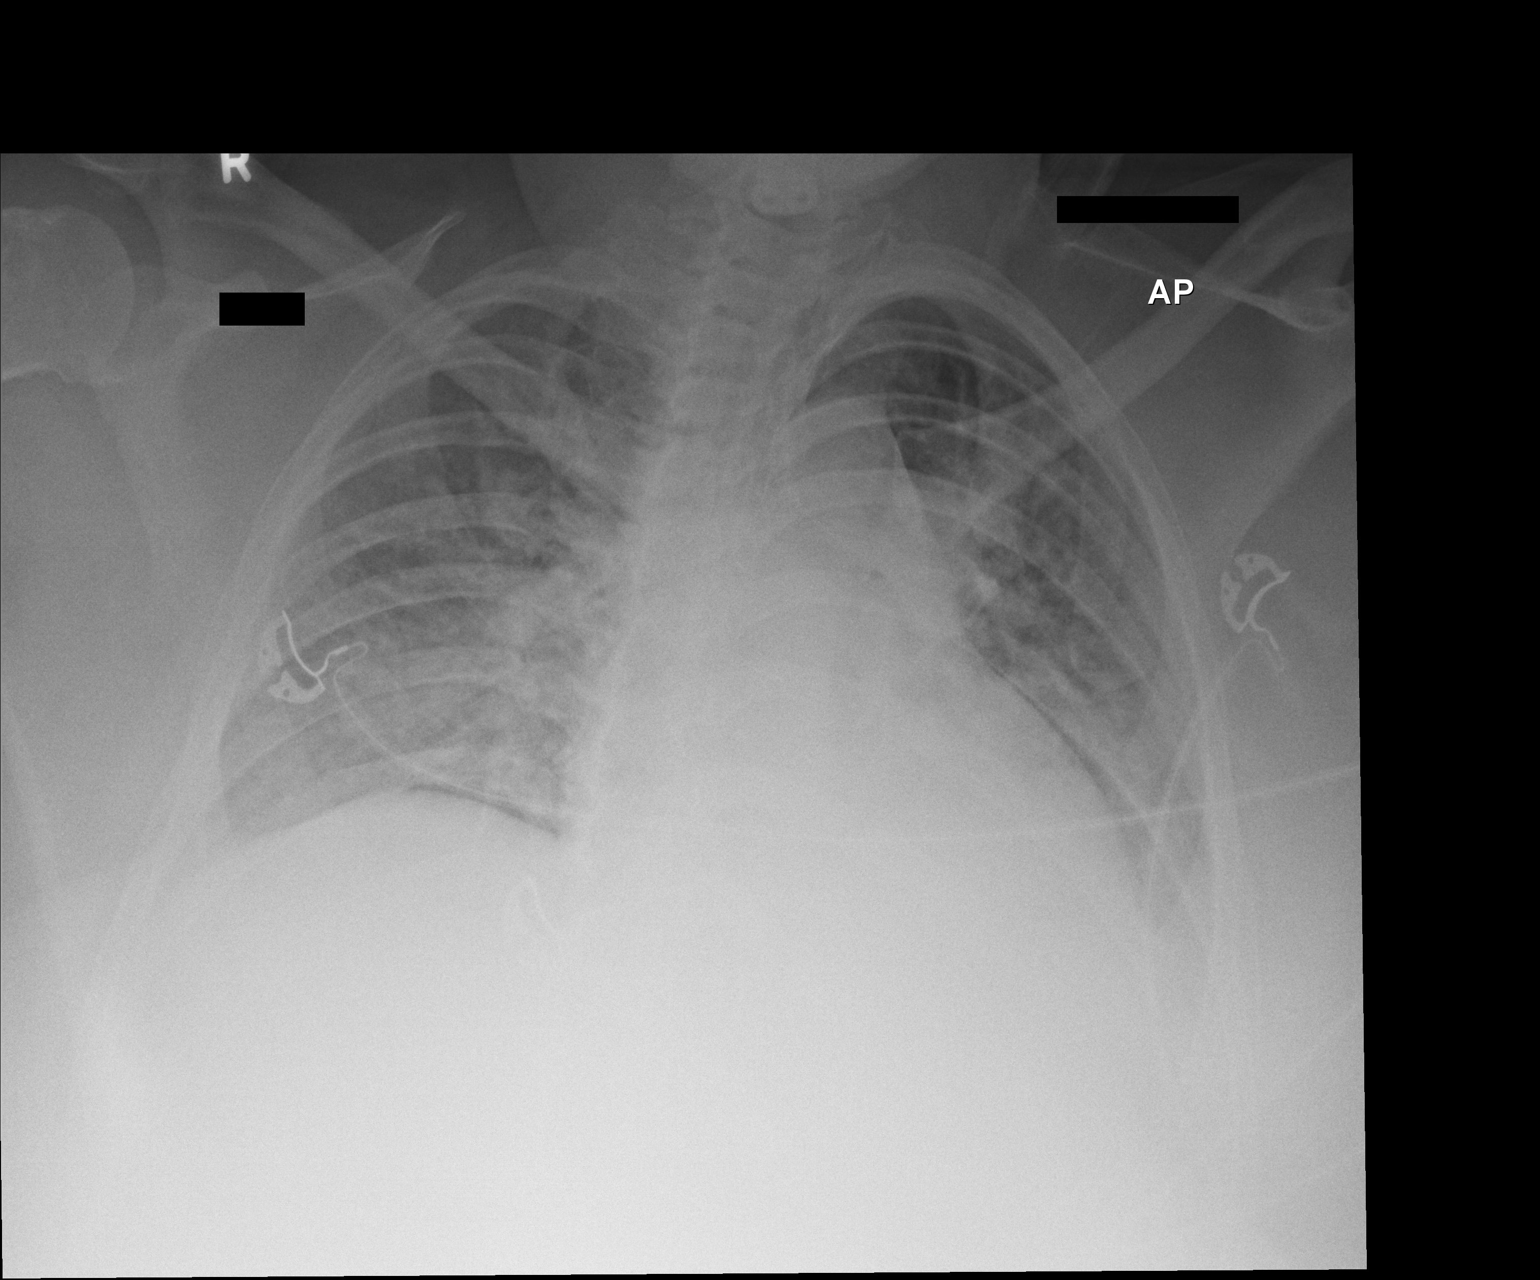

[1 of 1 positions shown; findings below may reference images not displayed]

FINDINGS: Heart, mediastinum and hila are unremarkable. Central vascular
congestion and bilateral, centrally predominant, ill-defined
interstitial and hazy airspace opacities are stable consistent with
pulmonary edema. Small effusions are suspected. No pneumothorax.
IMPRESSION: Persistent pulmonary edema without change from the prior study. No
new abnormalities.

## 2016-03-04 IMAGING — US US RENAL
1 series · 14 of 20 positions shown · non-contrast
Comparison: None

CLINICAL DATA: Rising BUN and creatinine question hydronephrosis

EXAM:
RENAL/URINARY TRACT ULTRASOUND COMPLETE

[Series 1: us renal · 0.24mm/px · 14 of 20 slices shown]
[im 1/20]
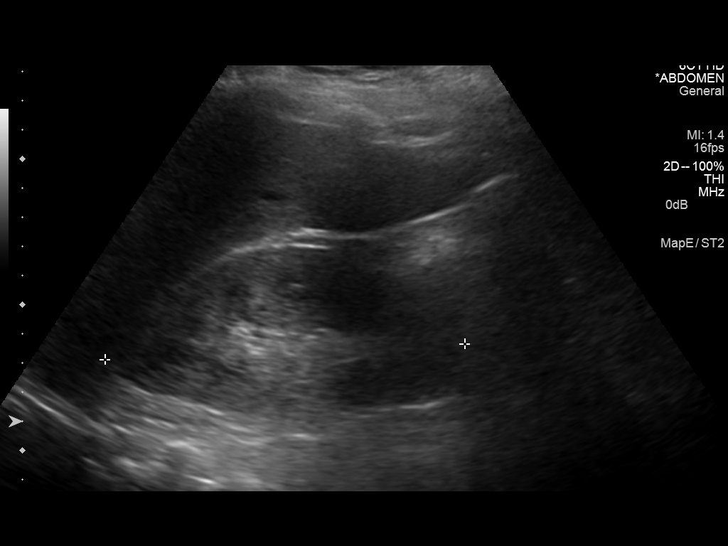
[im 3/20]
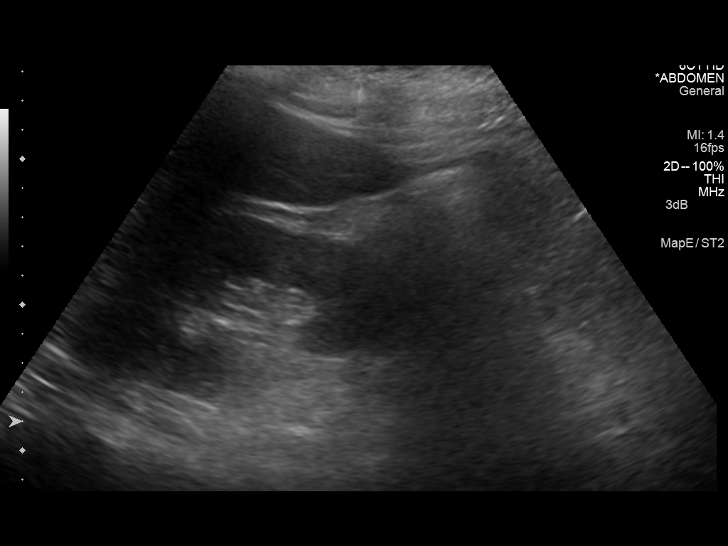
[im 4/20]
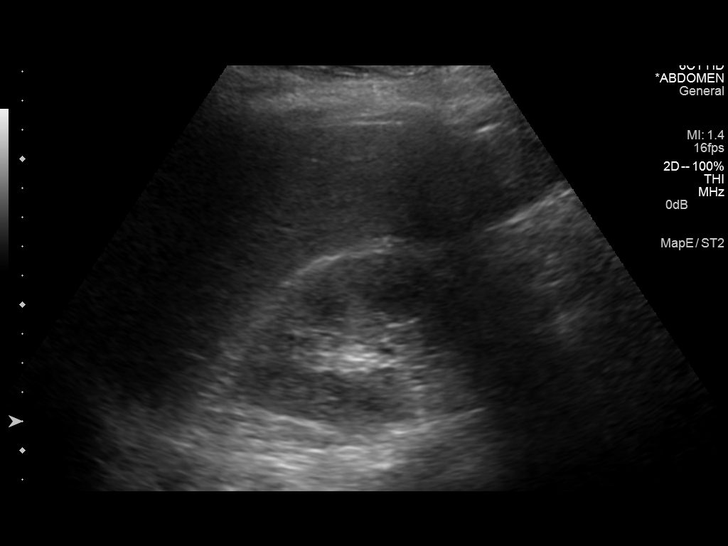
[im 6/20]
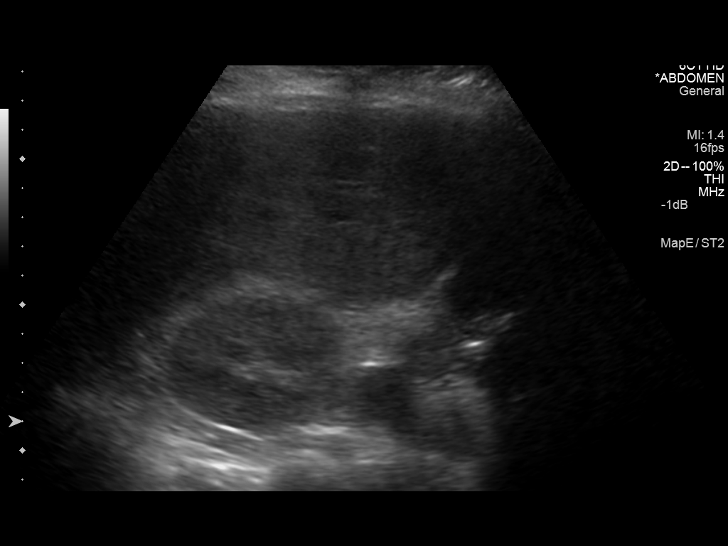
[im 7/20]
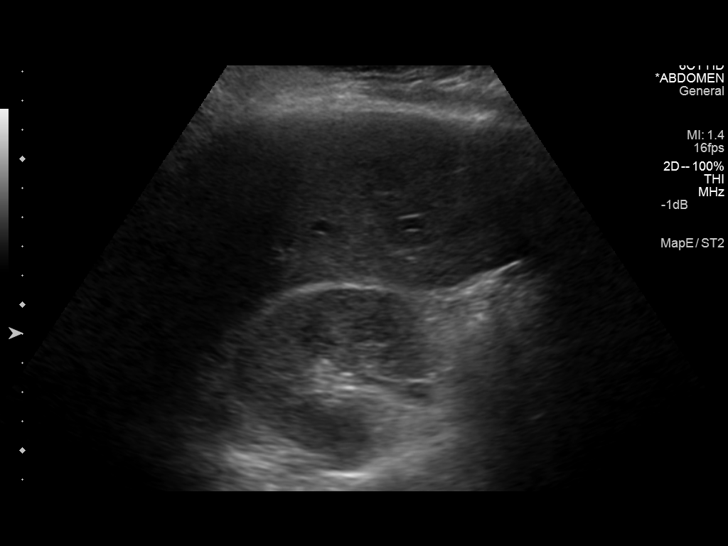
[im 8/20]
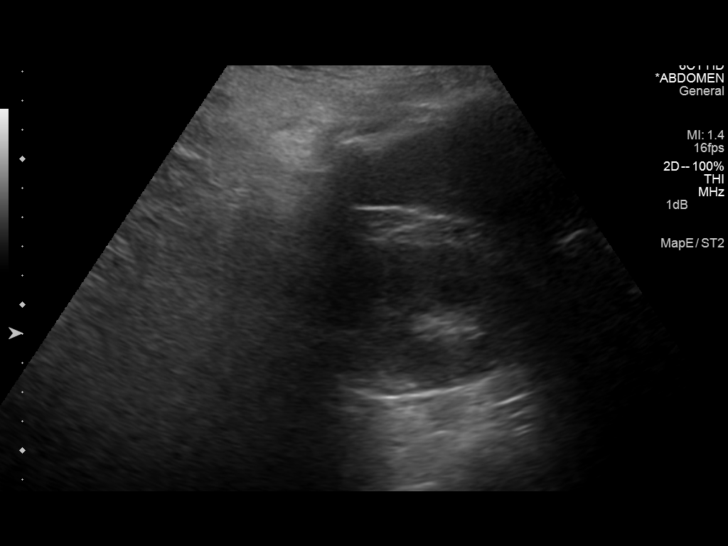
[im 10/20]
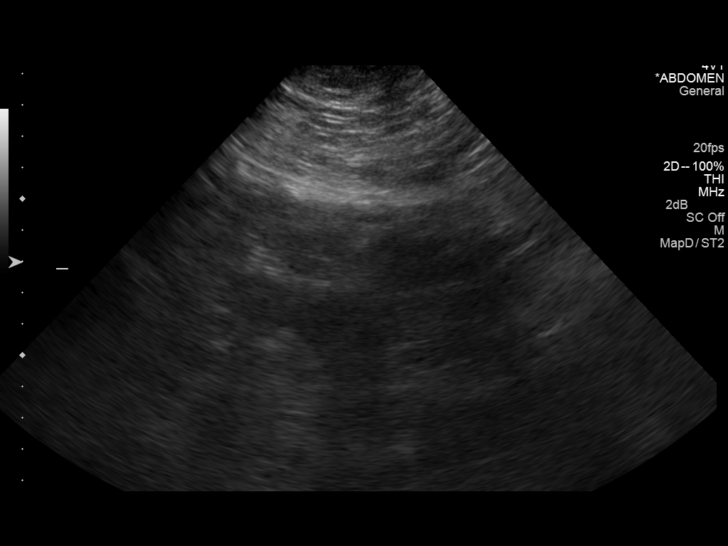
[im 11/20]
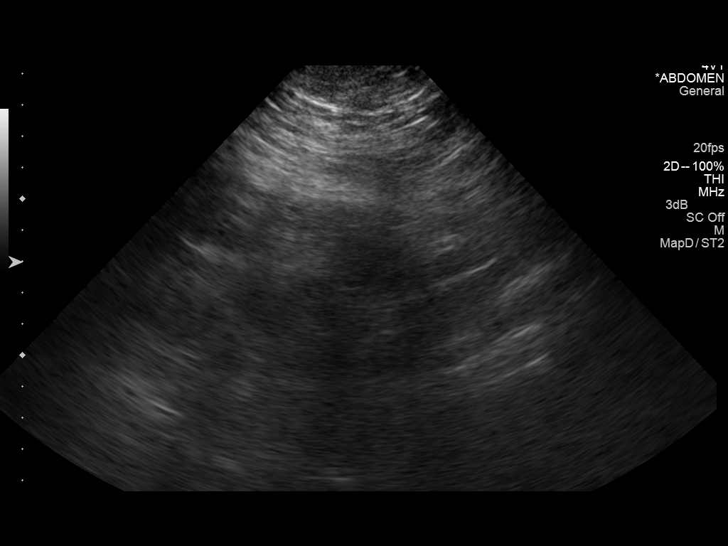
[im 13/20]
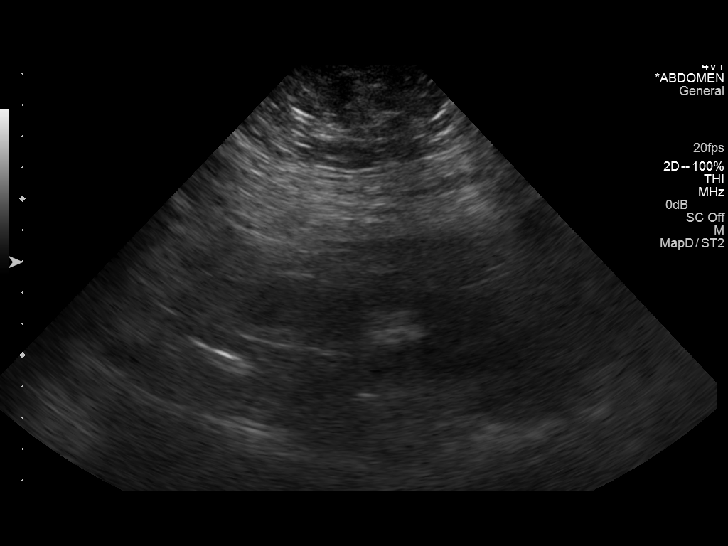
[im 14/20]
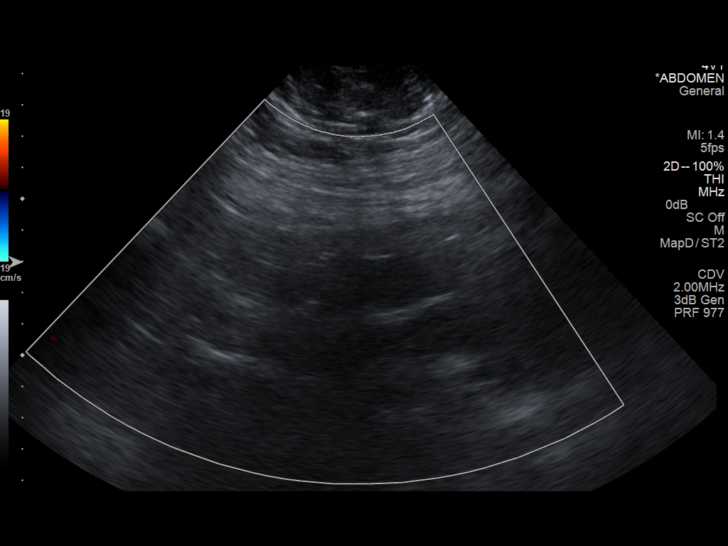
[im 16/20]
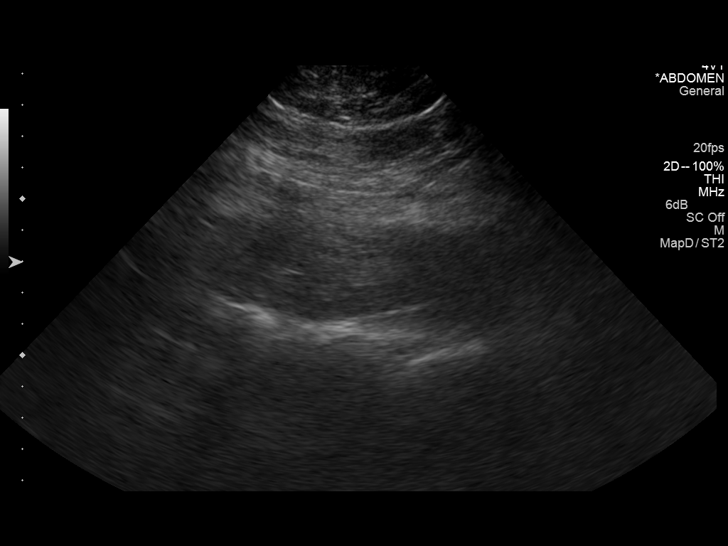
[im 17/20]
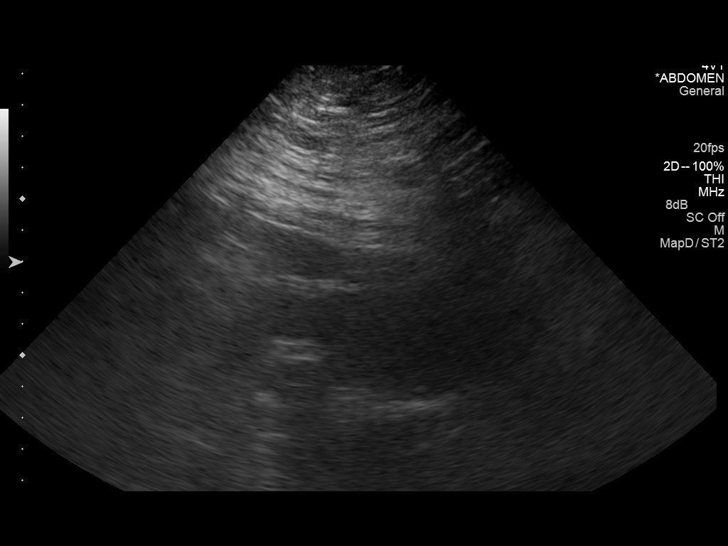
[im 18/20]
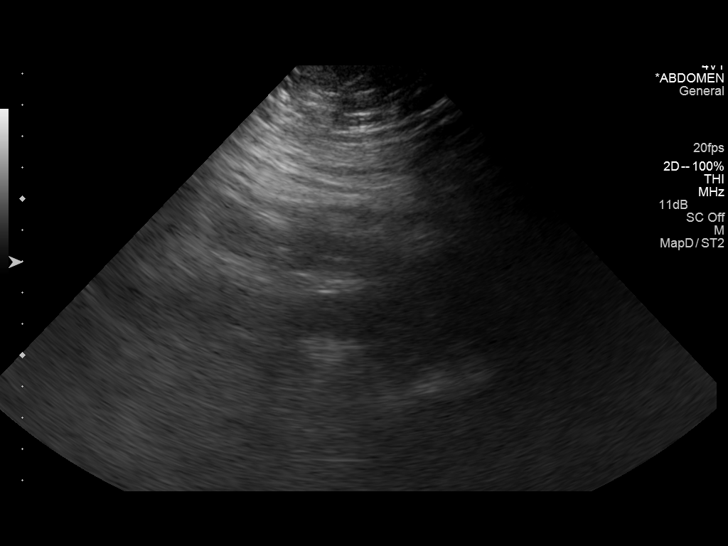
[im 20/20]
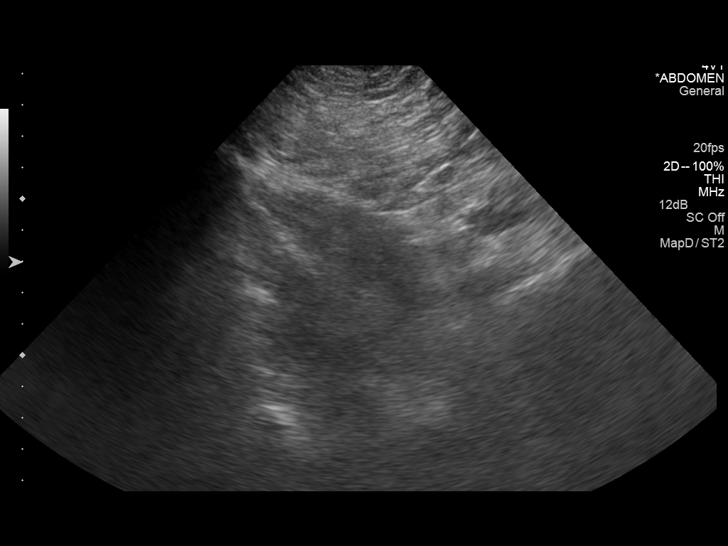

[14 of 20 positions shown; findings below may reference images not displayed]

FINDINGS: Right Kidney:

Length: 12.3 cm. Normal morphology without mass or hydronephrosis.
No shadowing calcifications.

Left Kidney:

Suboptimally visualized question due to body habitus. Unable to
accurately measure renal length. An large area of hypo echogenicity
is identified in the region of the LEFT kidney which could represent
a dilated renal collecting system or a large cyst but is
suboptimally localized and delineated.

Bladder:

Decompressed, unable to evaluate.
IMPRESSION: Normal appearing RIGHT kidney.

Unable to assess bladder due to decompressed state.

Inadequate visualization of LEFT kidney to assess length and
morphology; an area of hypo echogenicity is identified in the region
of LEFT kidney which could potentially represent a dilated renal
collecting system or a large cyst but appearance is nondiagnostic.

Recommend followup non contrast CT to exclude LEFT hydronephrosis.

## 2016-03-14 DIAGNOSIS — M2041 Other hammer toe(s) (acquired), right foot: Secondary | ICD-10-CM | POA: Diagnosis not present

## 2016-03-14 DIAGNOSIS — G479 Sleep disorder, unspecified: Secondary | ICD-10-CM | POA: Diagnosis not present

## 2016-03-14 DIAGNOSIS — Z794 Long term (current) use of insulin: Secondary | ICD-10-CM | POA: Diagnosis not present

## 2016-03-14 DIAGNOSIS — E1165 Type 2 diabetes mellitus with hyperglycemia: Secondary | ICD-10-CM | POA: Diagnosis not present

## 2016-03-14 DIAGNOSIS — E1140 Type 2 diabetes mellitus with diabetic neuropathy, unspecified: Secondary | ICD-10-CM | POA: Diagnosis not present

## 2016-03-14 DIAGNOSIS — M76821 Posterior tibial tendinitis, right leg: Secondary | ICD-10-CM | POA: Diagnosis not present

## 2016-03-14 DIAGNOSIS — B351 Tinea unguium: Secondary | ICD-10-CM | POA: Diagnosis not present

## 2016-03-14 DIAGNOSIS — Q6689 Other  specified congenital deformities of feet: Secondary | ICD-10-CM | POA: Diagnosis not present

## 2016-03-14 DIAGNOSIS — M2042 Other hammer toe(s) (acquired), left foot: Secondary | ICD-10-CM | POA: Diagnosis not present

## 2016-03-14 DIAGNOSIS — F5101 Primary insomnia: Secondary | ICD-10-CM | POA: Insufficient documentation

## 2016-03-21 ENCOUNTER — Encounter: Payer: Self-pay | Admitting: Pain Medicine

## 2016-03-21 ENCOUNTER — Ambulatory Visit: Payer: PPO | Attending: Pain Medicine | Admitting: Pain Medicine

## 2016-03-21 VITALS — BP 163/104 | HR 85 | Temp 98.5°F | Resp 14 | Ht 64.5 in | Wt 198.0 lb

## 2016-03-21 DIAGNOSIS — M5481 Occipital neuralgia: Secondary | ICD-10-CM | POA: Diagnosis not present

## 2016-03-21 DIAGNOSIS — E1340 Other specified diabetes mellitus with diabetic neuropathy, unspecified: Secondary | ICD-10-CM

## 2016-03-21 DIAGNOSIS — M75100 Unspecified rotator cuff tear or rupture of unspecified shoulder, not specified as traumatic: Secondary | ICD-10-CM | POA: Insufficient documentation

## 2016-03-21 DIAGNOSIS — M5134 Other intervertebral disc degeneration, thoracic region: Secondary | ICD-10-CM

## 2016-03-21 DIAGNOSIS — M542 Cervicalgia: Secondary | ICD-10-CM | POA: Diagnosis not present

## 2016-03-21 DIAGNOSIS — M19019 Primary osteoarthritis, unspecified shoulder: Secondary | ICD-10-CM | POA: Insufficient documentation

## 2016-03-21 DIAGNOSIS — M5137 Other intervertebral disc degeneration, lumbosacral region: Secondary | ICD-10-CM

## 2016-03-21 DIAGNOSIS — M533 Sacrococcygeal disorders, not elsewhere classified: Secondary | ICD-10-CM | POA: Diagnosis not present

## 2016-03-21 DIAGNOSIS — Z9889 Other specified postprocedural states: Secondary | ICD-10-CM | POA: Insufficient documentation

## 2016-03-21 DIAGNOSIS — M545 Low back pain: Secondary | ICD-10-CM | POA: Diagnosis not present

## 2016-03-21 DIAGNOSIS — M5416 Radiculopathy, lumbar region: Secondary | ICD-10-CM | POA: Diagnosis not present

## 2016-03-21 DIAGNOSIS — M62830 Muscle spasm of back: Secondary | ICD-10-CM | POA: Diagnosis not present

## 2016-03-21 DIAGNOSIS — M5126 Other intervertebral disc displacement, lumbar region: Secondary | ICD-10-CM

## 2016-03-21 DIAGNOSIS — M546 Pain in thoracic spine: Secondary | ICD-10-CM | POA: Diagnosis not present

## 2016-03-21 DIAGNOSIS — M5010 Cervical disc disorder with radiculopathy, unspecified cervical region: Secondary | ICD-10-CM | POA: Diagnosis not present

## 2016-03-21 DIAGNOSIS — M47817 Spondylosis without myelopathy or radiculopathy, lumbosacral region: Secondary | ICD-10-CM | POA: Diagnosis not present

## 2016-03-21 MED ORDER — OXYCODONE HCL 15 MG PO TABS: ORAL_TABLET | ORAL | 0 refills | Status: DC

## 2016-03-21 MED ORDER — TIZANIDINE HCL 4 MG PO TABS: ORAL_TABLET | ORAL | 0 refills | Status: DC

## 2016-03-21 NOTE — Progress Notes (Signed)
   The patient is a 40 year old female who returns to pain management for further evaluation and treatment of pain involving the neck upper extremity regions entire back and lower extremity regions. At the present time patient is scheduled to undergo surgery of the right shoulder performed by Dr. Franchot Mimes . The patient states that pain involving the shoulder is severe. The patient also is with pain involving the neck upper extremity region with degree as well as the lower back and lower extremity region. The patient has been tolerating Zanaflex and oxycodone without any undesirable side effects. We will continue presently prescribed medications and will consider modification of treatment pending follow-up evaluation. All agreed to suggested treatment plan.     Physical examination  Ere was tenderness of the paraspinal musculature region cervical and cervical facet region a moderate degree. There was well-healed surgical scar of the cervical region without increased warmth and erythema in the region of the scar. The patient was with right upper extremity in a sling. Palpation of the acromioclavicular and glenohumeral joint region of the left upper extremity was attends to palpation of moderate degree. The patient was with decreased grip strength on the right compared to the left. Tinel and Phalen's maneuver were without increased pain of significant degree. Palpation over the region of the thoracic region was with evidence of severe muscle spasm without crepitus of the thoracic region noted. Palpation over the region of the lumbar region was with moderate to severe tenderness to palpation with palpation over the PSIS and PII S region reproducing moderately severe discomfort as well. Straight leg raising limited to 20 without increased pain with dorsiflexion noted. EHL strength appeared to be decreased. There was questionably decreased sensation of the L5 dermatomal distribution with negative clonus  negative Homans. Abdomen nontender with no costovertebral tenderness noted    Severe appearing rotator cuff tendinopathy with a deep undersurface tear of the anterior and far lateral supraspinatus measuring approximately 0.8 cm front to back with 1-2 cm of laminar retraction. No atrophy. Severe tendinopathy and possible longitudinal split tearing of the intra-articular segment of the long head of biceps. Advanced for age acromioclavicular osteoarthritis. Prominent subacromial/subdeltoid fluid consistent with bursitis.  Degenerative joint disease of shoulder  Degenerative disc disease of the cervical spine  Status post surgical intervention of the cervical region  Cervical radiculopathy  Cervical facet syndrome      Continue present medications. tizanidine and oxycodone  F/U PCP  Dr Richarda Overlie for evaliation of  BP and general medical  condition.. Please follow-up with Dr. that the Von this week for evaluation of elevated blood pressure as discussed  F/U surgical evaluation. Follow-up with Dr. [name] as discussed   Surgery of right shoulder by Dr.  Franchot Mimes as planned  this week  F/U Dr Clayborn Bigness   Recommend deep myofascial massage/release 2 times per week with caution to avoid aggravation of symptoms as previously discussed  F/U neurological evaluation. May consider [facility] EMG studies and other studies. We will avoid such studies at this time  May consider radiofrequency rhizolysis or intraspinal procedures pending response to present treatment and F/U evaluation. We will avoid such studies at this time  Patient to call [facility] should patient have concerns prior to scheduled return appointment.   Assessment

## 2016-03-21 NOTE — Patient Instructions (Addendum)
PLAN    Continue present medications. tizanidine and oxycodone  F/U PCP  Dr Richarda Overlie for evaliation of  BP and general medical  condition.. Please follow-up with Dr. that the Von this week for evaluation of elevated blood pressure as discussed  F/U surgical evaluation. Follow-up with Dr. [name] as discussed   Surgery of right shoulder by Dr.  Franchot Mimes as planned  this week  F/U Dr Clayborn Bigness   Recommend deep myofascial massage/release 2 times per week with caution to avoid aggravation of symptoms as previously discussed  F/U neurological evaluation. May consider PNCV EMG studies and other studies. We will avoid such studies at this time  May consider radiofrequency rhizolysis or intraspinal procedures pending response to present treatment and F/U evaluation. We will avoid such studies at this time  Patient to call [facility] should patient have concerns prior to scheduled return appointment.

## 2016-03-23 ENCOUNTER — Encounter
Admission: RE | Admit: 2016-03-23 | Discharge: 2016-03-23 | Disposition: A | Discharge status: Home / Self Care | Payer: PPO | Source: Ambulatory Visit | Attending: Unknown Physician Specialty | Admitting: Unknown Physician Specialty

## 2016-03-23 DIAGNOSIS — Z794 Long term (current) use of insulin: Secondary | ICD-10-CM | POA: Diagnosis not present

## 2016-03-23 DIAGNOSIS — E118 Type 2 diabetes mellitus with unspecified complications: Secondary | ICD-10-CM | POA: Diagnosis not present

## 2016-03-23 DIAGNOSIS — M65811 Other synovitis and tenosynovitis, right shoulder: Secondary | ICD-10-CM | POA: Diagnosis not present

## 2016-03-23 DIAGNOSIS — Y939 Activity, unspecified: Secondary | ICD-10-CM | POA: Diagnosis not present

## 2016-03-23 DIAGNOSIS — K219 Gastro-esophageal reflux disease without esophagitis: Secondary | ICD-10-CM | POA: Diagnosis not present

## 2016-03-23 DIAGNOSIS — X58XXXA Exposure to other specified factors, initial encounter: Secondary | ICD-10-CM | POA: Diagnosis not present

## 2016-03-23 DIAGNOSIS — M659 Synovitis and tenosynovitis, unspecified: Secondary | ICD-10-CM | POA: Diagnosis not present

## 2016-03-23 DIAGNOSIS — G4730 Sleep apnea, unspecified: Secondary | ICD-10-CM | POA: Diagnosis not present

## 2016-03-23 DIAGNOSIS — S46111A Strain of muscle, fascia and tendon of long head of biceps, right arm, initial encounter: Secondary | ICD-10-CM | POA: Diagnosis not present

## 2016-03-23 DIAGNOSIS — R51 Headache: Secondary | ICD-10-CM | POA: Diagnosis not present

## 2016-03-23 DIAGNOSIS — F418 Other specified anxiety disorders: Secondary | ICD-10-CM | POA: Diagnosis not present

## 2016-03-23 DIAGNOSIS — M24011 Loose body in right shoulder: Secondary | ICD-10-CM | POA: Diagnosis not present

## 2016-03-23 HISTORY — DX: Restless legs syndrome: G25.81

## 2016-03-23 LAB — BASIC METABOLIC PANEL
ANION GAP: 7 (ref 5–15)
BUN: 15 mg/dL (ref 6–20)
CO2: 27 mmol/L (ref 22–32)
Calcium: 9.3 mg/dL (ref 8.9–10.3)
Chloride: 106 mmol/L (ref 101–111)
Creatinine, Ser: 0.77 mg/dL (ref 0.44–1.00)
GFR calc Af Amer: >60 mL/min (ref >60)
GFR calc non Af Amer: >60 mL/min (ref >60)
GLUCOSE: 57 mg/dL — AB (ref 65–99)
POTASSIUM: 3.4 mmol/L — AB (ref 3.5–5.1)
Sodium: 140 mmol/L (ref 135–145)

## 2016-03-23 NOTE — Pre-Procedure Instructions (Signed)
Cardiac clearance on chart from Dr. Clayborn Bigness, cleared at moderate risk.

## 2016-03-23 NOTE — Patient Instructions (Signed)
  Your procedure is scheduled [id number] 11, 2017 (friday) Report to Same Day Surgery 2nd floor [facility] To find out your arrival time please call [phone] between 1PM - 3PM on ARRIVAL TIME 8:00 AM  Remember: Instructions that are not followed completely may result in serious medical risk, up to and including death, or upon the discretion of your surgeon and anesthesiologist your surgery may need to be rescheduled.    _x___ 1. Do not eat food or drink liquids after midnight. No gum chewing or hard candies.     _x__ 2. No Alcohol for 24 hours before or after surgery.   _x__3. No Smoking for 24 prior to surgery.   ____  4. Bring all medications with you on the day of surgery if instructed.    __x__ 5. Notify your doctor if there is any change in your medical condition     (cold, fever, infections).     Do not wear jewelry, make-up, hairpins, clips or nail polish.  Do not wear lotions, powders, or perfumes. You may wear deodorant.  Do not shave 48 hours prior to surgery. Men may shave face and neck.  Do not bring valuables to the hospital.    [facility] is not responsible for any belongings or valuables.               Contacts, dentures or bridgework may not be worn into surgery.  Leave your suitcase in the car. After surgery it may be brought to your room.  For patients admitted to the hospital, discharge time is determined by your treatment team.   Patients discharged the day of surgery will not be allowed to drive home.    Please read over the following fact sheets that you were given:   [facility] Preparing for Surgery and or MRSA Information   _x___ Take these medicines the morning of surgery with A SIP OF WATER:    1. CARVEDILOL  2. CLONIDINE  3. GABAPENTIN  4.  5.  6.  ____ Fleet Enema (as directed)   _x___ Use CHG Soap or sage wipes as directed on instruction sheet   ____ Use inhalers on the day of surgery and bring to hospital day of surgery  _x___ Stop  metformin 2 days prior to surgery (NO METFORMIN TONIGHT)    ____ Take 1/2 of usual insulin dose the night before surgery and none on the morning of surgery          __x__ Stop aspirin or coumadin, or plavix (NO ASPIRIN)  _x__ Stop Anti-inflammatories such as Advil, Aleve, Ibuprofen, Motrin, Naproxen,          Naprosyn, Goodies powders or aspirin products. Ok to take Tylenol.   ____ Stop supplements until after surgery.    ____ Bring C-Pap to the hospital.

## 2016-03-23 NOTE — Pre-Procedure Instructions (Signed)
Dr. Adella Hare cleared patient for surgery as an acceptable risk for surgery.

## 2016-03-24 ENCOUNTER — Ambulatory Visit: Payer: PPO | Admitting: Anesthesiology

## 2016-03-24 ENCOUNTER — Ambulatory Visit
Admission: RE | Admit: 2016-03-24 | Discharge: 2016-03-24 | Disposition: A | Discharge status: Home / Self Care | Payer: PPO | Source: Ambulatory Visit | Attending: Unknown Physician Specialty | Admitting: Unknown Physician Specialty

## 2016-03-24 ENCOUNTER — Encounter: Payer: Self-pay | Admitting: Anesthesiology

## 2016-03-24 ENCOUNTER — Encounter
Admission: RE | Disposition: A | Discharge status: Home / Self Care | Payer: Self-pay | Source: Ambulatory Visit | Attending: Unknown Physician Specialty

## 2016-03-24 DIAGNOSIS — Y939 Activity, unspecified: Secondary | ICD-10-CM | POA: Insufficient documentation

## 2016-03-24 DIAGNOSIS — M659 Synovitis and tenosynovitis, unspecified: Secondary | ICD-10-CM | POA: Diagnosis not present

## 2016-03-24 DIAGNOSIS — R51 Headache: Secondary | ICD-10-CM | POA: Insufficient documentation

## 2016-03-24 DIAGNOSIS — S46111A Strain of muscle, fascia and tendon of long head of biceps, right arm, initial encounter: Secondary | ICD-10-CM | POA: Diagnosis not present

## 2016-03-24 DIAGNOSIS — M25511 Pain in right shoulder: Secondary | ICD-10-CM | POA: Diagnosis not present

## 2016-03-24 DIAGNOSIS — M24011 Loose body in right shoulder: Secondary | ICD-10-CM | POA: Diagnosis not present

## 2016-03-24 DIAGNOSIS — F418 Other specified anxiety disorders: Secondary | ICD-10-CM | POA: Insufficient documentation

## 2016-03-24 DIAGNOSIS — Z794 Long term (current) use of insulin: Secondary | ICD-10-CM | POA: Insufficient documentation

## 2016-03-24 DIAGNOSIS — G8918 Other acute postprocedural pain: Secondary | ICD-10-CM | POA: Diagnosis not present

## 2016-03-24 DIAGNOSIS — E118 Type 2 diabetes mellitus with unspecified complications: Secondary | ICD-10-CM | POA: Insufficient documentation

## 2016-03-24 DIAGNOSIS — K219 Gastro-esophageal reflux disease without esophagitis: Secondary | ICD-10-CM | POA: Insufficient documentation

## 2016-03-24 DIAGNOSIS — M65811 Other synovitis and tenosynovitis, right shoulder: Secondary | ICD-10-CM | POA: Insufficient documentation

## 2016-03-24 DIAGNOSIS — M7521 Bicipital tendinitis, right shoulder: Secondary | ICD-10-CM | POA: Diagnosis not present

## 2016-03-24 DIAGNOSIS — X58XXXA Exposure to other specified factors, initial encounter: Secondary | ICD-10-CM | POA: Insufficient documentation

## 2016-03-24 DIAGNOSIS — G4730 Sleep apnea, unspecified: Secondary | ICD-10-CM | POA: Insufficient documentation

## 2016-03-24 HISTORY — PX: SHOULDER ARTHROSCOPY WITH BICEPSTENOTOMY: SHX6204

## 2016-03-24 LAB — GLUCOSE, CAPILLARY
GLUCOSE-CAPILLARY: 141 mg/dL — AB (ref 65–99)
Glucose-Capillary: 154 mg/dL — ABNORMAL HIGH (ref 65–99)

## 2016-03-24 SURGERY — SHOULDER ARTHROSCOPY WITH BICEPS TENOTOMY
Anesthesia: General | Site: Shoulder | Laterality: Right | Wound class: Clean

## 2016-03-24 MED ORDER — OXYCODONE HCL 5 MG PO TABS
5.0000 mg | ORAL_TABLET | Freq: Once | ORAL | Status: AC | PRN
  Administered 2016-03-24: 5 mg via ORAL

## 2016-03-24 MED ORDER — ROPIVACAINE HCL 5 MG/ML IJ SOLN
INTRAMUSCULAR | Status: DC | PRN
  Administered 2016-03-24 (×3): 10 mL via PERINEURAL

## 2016-03-24 MED ORDER — SODIUM CHLORIDE 0.9 % IV SOLN: INTRAVENOUS | Status: DC

## 2016-03-24 MED ORDER — OXYCODONE HCL 5 MG/5ML PO SOLN: 5.0000 mg | Freq: Once | ORAL | Status: AC | PRN

## 2016-03-24 MED ORDER — SUGAMMADEX SODIUM 200 MG/2ML IV SOLN
INTRAVENOUS | Status: DC | PRN
  Administered 2016-03-24: 180 mg via INTRAVENOUS

## 2016-03-24 MED ORDER — FENTANYL CITRATE (PF) 100 MCG/2ML IJ SOLN
50.0000 ug | Freq: Once | INTRAMUSCULAR | Status: AC
  Administered 2016-03-24: 50 ug via INTRAVENOUS

## 2016-03-24 MED ORDER — SUCCINYLCHOLINE CHLORIDE 20 MG/ML IJ SOLN
INTRAMUSCULAR | Status: DC | PRN
  Administered 2016-03-24: 100 mg via INTRAVENOUS

## 2016-03-24 MED ORDER — ROCURONIUM BROMIDE 100 MG/10ML IV SOLN
INTRAVENOUS | Status: DC | PRN
  Administered 2016-03-24: 30 mg via INTRAVENOUS

## 2016-03-24 MED ORDER — MIDAZOLAM HCL 5 MG/5ML IJ SOLN
INTRAMUSCULAR | Status: AC
  Filled 2016-03-24: qty 5

## 2016-03-24 MED ORDER — FAMOTIDINE 20 MG PO TABS
ORAL_TABLET | ORAL | Status: AC
  Filled 2016-03-24: qty 1

## 2016-03-24 MED ORDER — FAMOTIDINE 20 MG PO TABS: 20.0000 mg | ORAL_TABLET | Freq: Once | ORAL | Status: DC

## 2016-03-24 MED ORDER — FENTANYL CITRATE (PF) 100 MCG/2ML IJ SOLN
INTRAMUSCULAR | Status: AC
  Administered 2016-03-24: 50 ug via INTRAVENOUS
  Filled 2016-03-24: qty 2

## 2016-03-24 MED ORDER — LIDOCAINE HCL (CARDIAC) 20 MG/ML IV SOLN
INTRAVENOUS | Status: DC | PRN
  Administered 2016-03-24: 100 mg via INTRAVENOUS

## 2016-03-24 MED ORDER — LIDOCAINE HCL (PF) 2 % IJ SOLN
INTRAMUSCULAR | Status: DC | PRN
  Administered 2016-03-24: 1 mL

## 2016-03-24 MED ORDER — PHENYLEPHRINE HCL 10 MG/ML IJ SOLN
INTRAMUSCULAR | Status: DC | PRN
  Administered 2016-03-24: 200 ug via INTRAVENOUS
  Administered 2016-03-24 (×5): 100 ug via INTRAVENOUS

## 2016-03-24 MED ORDER — FENTANYL CITRATE (PF) 100 MCG/2ML IJ SOLN
INTRAMUSCULAR | Status: AC
  Administered 2016-03-24: 25 ug via INTRAVENOUS
  Filled 2016-03-24: qty 2

## 2016-03-24 MED ORDER — MIDAZOLAM HCL 5 MG/5ML IJ SOLN
2.0000 mg | Freq: Once | INTRAMUSCULAR | Status: AC
  Administered 2016-03-24: 2 mg via INTRAVENOUS

## 2016-03-24 MED ORDER — OXYCODONE HCL 5 MG PO TABS
ORAL_TABLET | ORAL | Status: AC
  Filled 2016-03-24: qty 1

## 2016-03-24 MED ORDER — CLINDAMYCIN PHOSPHATE 600 MG/50ML IV SOLN
INTRAVENOUS | Status: AC
  Filled 2016-03-24: qty 50

## 2016-03-24 MED ORDER — FAMOTIDINE 20 MG PO TABS
20.0000 mg | ORAL_TABLET | Freq: Once | ORAL | Status: AC
  Administered 2016-03-24: 20 mg via ORAL

## 2016-03-24 MED ORDER — CLINDAMYCIN PHOSPHATE 600 MG/50ML IV SOLN
INTRAVENOUS | Status: DC | PRN
  Administered 2016-03-24: 600 mg via INTRAVENOUS

## 2016-03-24 MED ORDER — LIDOCAINE HCL (PF) 1 % IJ SOLN
INTRAMUSCULAR | Status: AC
  Filled 2016-03-24: qty 5

## 2016-03-24 MED ORDER — PROPOFOL 10 MG/ML IV BOLUS
INTRAVENOUS | Status: DC | PRN
  Administered 2016-03-24: 180 mg via INTRAVENOUS

## 2016-03-24 MED ORDER — SODIUM CHLORIDE 0.9 % IV SOLN
INTRAVENOUS | Status: DC
  Administered 2016-03-24: 09:00:00 via INTRAVENOUS

## 2016-03-24 MED ORDER — ONDANSETRON HCL 4 MG/2ML IJ SOLN
INTRAMUSCULAR | Status: DC | PRN
  Administered 2016-03-24: 4 mg via INTRAVENOUS

## 2016-03-24 MED ORDER — CLINDAMYCIN PHOSPHATE 600 MG/50ML IV SOLN: 600.0000 mg | Freq: Four times a day (QID) | INTRAVENOUS | Status: DC

## 2016-03-24 MED ORDER — FENTANYL CITRATE (PF) 100 MCG/2ML IJ SOLN
25.0000 ug | INTRAMUSCULAR | Status: DC | PRN
  Administered 2016-03-24: 50 ug via INTRAVENOUS
  Administered 2016-03-24: 25 ug via INTRAVENOUS

## 2016-03-24 MED ORDER — OXYCODONE-ACETAMINOPHEN 5-325 MG PO TABS: 1.0000 | ORAL_TABLET | Freq: Four times a day (QID) | ORAL | 0 refills | Status: DC | PRN

## 2016-03-24 MED ORDER — EPINEPHRINE HCL 1 MG/ML IJ SOLN
INTRAMUSCULAR | Status: AC
  Filled 2016-03-24: qty 1

## 2016-03-24 MED ORDER — ROPIVACAINE HCL 5 MG/ML IJ SOLN
INTRAMUSCULAR | Status: AC
  Filled 2016-03-24: qty 40

## 2016-03-24 SURGICAL SUPPLY — 68 items
ADAPTER IRRIG TUBE 2 SPIKE SOL (ADAPTER) ×6 IMPLANT
ARTHROWAND PARAGON T2 (SURGICAL WAND)
BLADE ABRADER 4.5 (BLADE) IMPLANT
BLADE SHAVER 4.5X7 STR FR (MISCELLANEOUS) ×3 IMPLANT
BLADE SURG 15 STRL LF DISP TIS (BLADE) IMPLANT
BLADE SURG 15 STRL SS (BLADE)
BUR ABRADER 5.5 BLK (MISCELLANEOUS) IMPLANT
BUR BR 5.5 WIDE MOUTH (BURR) IMPLANT
BURR ABRADER 5.5 BLK (MISCELLANEOUS)
CANNULA 8.5X75 THRED (CANNULA) IMPLANT
CANNULA SHAVER 8MMX76MM (CANNULA) IMPLANT
CAP LOCK ULTRA CANNULA (MISCELLANEOUS) ×3 IMPLANT
CHLORAPREP W/TINT 26ML (MISCELLANEOUS) ×6 IMPLANT
DRAPE STERI 35X30 U-POUCH (DRAPES) ×3 IMPLANT
ELECT REM PT RETURN 9FT ADLT (ELECTROSURGICAL) ×3
ELECTRODE REM PT RTRN 9FT ADLT (ELECTROSURGICAL) ×2 IMPLANT
GAUZE SPONGE 4X4 12PLY STRL (GAUZE/BANDAGES/DRESSINGS) ×3 IMPLANT
GLOVE BIO SURGEON STRL SZ7.5 (GLOVE) ×3 IMPLANT
GLOVE BIO SURGEON STRL SZ8 (GLOVE) ×3 IMPLANT
GLOVE INDICATOR 8.0 STRL GRN (GLOVE) ×3 IMPLANT
GOWN STRL REUS W/ TWL LRG LVL3 (GOWN DISPOSABLE) ×2 IMPLANT
GOWN STRL REUS W/TWL LRG LVL3 (GOWN DISPOSABLE) ×1
GOWN STRL REUS W/TWL LRG LVL4 (GOWN DISPOSABLE) ×3 IMPLANT
IV LACTATED RINGER IRRG 3000ML (IV SOLUTION) ×4
IV LR IRRIG 3000ML ARTHROMATIC (IV SOLUTION) ×8 IMPLANT
KIT SHOULDER TRACTION (DRAPES) ×3 IMPLANT
KIT SUTURE 2.8 Q-FIX DISP (MISCELLANEOUS) IMPLANT
MANIFOLD 4PT FOR NEPTUNE1 (MISCELLANEOUS) ×3 IMPLANT
NEEDLE 18GX1X1/2 (RX/OR ONLY) (NEEDLE) IMPLANT
NEEDLE MAYO CATGUT SZ 1.5 (NEEDLE)
NEEDLE MAYO CATGUT SZ 2 (NEEDLE) IMPLANT
NEEDLE SPNL 18GX3.5 QUINCKE PK (NEEDLE) IMPLANT
PACK ARTHROSCOPY SHOULDER (MISCELLANEOUS) ×3 IMPLANT
PASSER SUT CAPTURE FIRST (SUTURE) IMPLANT
SET TUBE SUCT SHAVER OUTFL 24K (TUBING) ×3 IMPLANT
SLING ARM M TX990204 (SOFTGOODS) ×3 IMPLANT
SOL PREP PVP 2OZ (MISCELLANEOUS) ×3
SOLUTION PREP PVP 2OZ (MISCELLANEOUS) ×2 IMPLANT
STAPLER SKIN PROX 35W (STAPLE) IMPLANT
SUT ETHIBOND NAB CT1 #1 30IN (SUTURE) IMPLANT
SUT ETHILON 3-0 FS-10 30 BLK (SUTURE)
SUT PDS AB 1 CT1 27 (SUTURE) IMPLANT
SUT PROLENE 2 0 CT2 30 (SUTURE) IMPLANT
SUT SMART STITCH CARTRIDGE (SUTURE) IMPLANT
SUT TICRON 2-0 30IN 311381 (SUTURE) IMPLANT
SUT VIC AB 0 CT1 36 (SUTURE) IMPLANT
SUT VIC AB 0 CT2 27 (SUTURE) IMPLANT
SUT VIC AB 2-0 CT1 27 (SUTURE)
SUT VIC AB 2-0 CT1 TAPERPNT 27 (SUTURE) IMPLANT
SUT VIC AB 2-0 CT2 27 (SUTURE) IMPLANT
SUT VIC AB 2-0 SH 27 (SUTURE)
SUT VIC AB 2-0 SH 27XBRD (SUTURE) IMPLANT
SUT VIC AB 3-0 SH 27 (SUTURE)
SUT VIC AB 3-0 SH 27X BRD (SUTURE) IMPLANT
SUTURE EHLN 3-0 FS-10 30 BLK (SUTURE) IMPLANT
SUTURE MAGNUM WIRE 2X48 BLK (SUTURE) IMPLANT
SYRINGE 10CC LL (SYRINGE) IMPLANT
TAPE MICROFOAM 4IN (TAPE) ×3 IMPLANT
TUBING ARTHRO INFLOW-ONLY STRL (TUBING) ×3 IMPLANT
WAND 30 DEG SABER W/CORD (SURGICAL WAND) IMPLANT
WAND ARTHRO PARAGON T2 (SURGICAL WAND) IMPLANT
WAND COVAC 50 IFS (MISCELLANEOUS) IMPLANT
WAND COVATOR 20 (MISCELLANEOUS) IMPLANT
WAND HAND CNTRL MULTIVAC 50 (MISCELLANEOUS) IMPLANT
WAND HAND CNTRL MULTIVAC 90 (MISCELLANEOUS) ×3 IMPLANT
WAND TENDON TOPAZ 0 ANGL (MISCELLANEOUS) IMPLANT
WAND TOPAZ EPF  WAS Q (MISCELLANEOUS)
WAND TOPAZ EPF WAS Q (MISCELLANEOUS) IMPLANT

## 2016-03-24 NOTE — Op Note (Signed)
Expand All Collapse All   03/29/2015  1:32 PM  Patient:[name], Marissa L.  Pre-Op Diagnosis: Partially torn long head of the biceps tendon right shoulder plus possible rotator cuff tear  Postoperative diagnosis:Partially torn long head of the biceps tendon right shoulder plus synovitis right glenohumeral joint and loose bodies    Procedure: Arthroscopic release of the long head of the biceps tendon plus limited synovectomy glenohumeral joint and removal of loose bodies  Anesthesia: General endotracheal with interscalene block placed preoperatively by the anesthesiologist.  Findings: As above.   Complications: None  Estimated blood loss: negligible  Tourniquet time: None  Drains: None   Brief clinical note: The patient's symptoms have progressed despite medications, activity modification, etc. The patient's history and examination are consistent with possible impingement symptomatology plus long head of biceps tendinitis. The MRI revealed a partial tear of the long head of biceps tendon plus possible small rotator cuff tear. The patient presents at this time for definitive management of these shoulder symptoms.  Procedure: The patient was brought into the operating room and placed in the supine position. The patient then underwent general endotracheal intubation and anesthesia before being repositioned in the lateral decubitus position. The right shoulder was prepped and draped in usual fashion for shoulder procedure. The shoulder was supported with the Acufex shoulder suspension device.10 pounds of traction was utilized. Preoperative IV clindamycin was administered. A timeout was performed . A posterior portal was created and the glenohumeral joint thoroughly inspected revealing no degenerative arthritis and an intact but slightly frayed labrum. There was significant fraying of the labral attachment of the long head of the biceps tendon. This was associated  with the reactive synovitis. There were also several loose bodies within the glenohumeral joint. No significant rotator cuff tear was visualized, however. An anterior portal was created. An ArthroCare wand was inserted and used to obtain hemostasis as well as to perform a limited synovectomy.The biceps tendon was evaluated and then released from its labral attachment using an ArthroCare wand. The loose bodies were also removed at this time.  The scope was repositioned through the posterior portal into the subacromial space. A separate lateral portal was created using an outside-in technique. An ArthroCare 90 wand followed by a 4.0 full-radius resector was introduced and used to perform a subtotal bursectomy. After careful inspection of the rotator cuff no obvious cuff tear was appreciated. The arthroscope was removed from the subacromial space.  The portal sites were closed using 3-O nylon sutures. A sterile bulky dressing was applied to the shoulder . This was followed by a sling. The patient was then awakened, extubated, and returned to the recovery room in satisfactory condition after tolerating the procedure well.  Blood loss was negligible.

## 2016-03-24 NOTE — Discharge Instructions (Signed)
°  AMBULATORY SURGERY  DISCHARGE INSTRUCTIONS   1) The drugs that you were given will stay in your system until tomorrow so for the next 24 hours you should not:  A) Drive an automobile B) Make any legal decisions C) Drink any alcoholic beverage   2) You may resume regular meals tomorrow.  Today it is better to start with liquids and gradually work up to solid foods.  You may eat anything you prefer, but it is better to start with liquids, then soup and crackers, and gradually work up to solid foods.   3) Please notify your doctor immediately if you have any unusual bleeding, trouble breathing, redness and pain at the surgery site, drainage, fever, or pain not relieved by medication.    Please contact your physician with any problems or Same Day Surgery at [phone], Monday through Friday 6 am to 4 pm, or [place] at [facility] Day Surgery number at [phone].   Diet: As you were doing prior to hospitalization   Dressing:  You may remove your dressing 3 days after surgery. Then apply waterproof bandaids. You can shower at this time but need to change the waterproof bandaids after showering.  Activity:  Increase activity slowly as tolerated. Can drive when comfortable.    Sling: D/C when comfortable    RTC: 1 week  Ice pack as needed   To prevent constipation: you may use a stool softener such as - Miralax (over the counter) for constipation as needed.    To prevent venous clotting Take one 81 mg. ASA tablet  2X per day for about 2 weeks post surgery.  Precautions:  If you experience chest pain or shortness of breath - call 911 immediately for transfer to the hospital emergency department!!  If you develop a fever greater that 101 F, purulent drainage from wound, increased redness or drainage from wound, or calf pain -- Call the office at [phone]                                             Follow- Up Appointment:  Please call for an appointment to be seen in 1 wk.

## 2016-03-24 NOTE — OR Nursing (Signed)
To PACU via stretcher for block - report given to Glenna Fellows, RN

## 2016-03-24 NOTE — Anesthesia Preprocedure Evaluation (Signed)
Anesthesia Evaluation  Patient identified by MRN, date of birth, ID band Patient awake    Reviewed: Allergy & Precautions, H&P , NPO status , Patient's Chart, lab work & pertinent test results  History of Anesthesia Complications Negative for: history of anesthetic complications  Airway Mallampati: II  TM Distance: >3 FB Neck ROM: full    Dental  (+) Poor Dentition, Chipped   Pulmonary shortness of breath and with exertion, sleep apnea , pneumonia, resolved,    Pulmonary exam normal breath sounds clear to auscultation       Cardiovascular Exercise Tolerance: Good hypertension, (-) angina+ CAD, + Past MI and +CHF  Normal cardiovascular exam+ dysrhythmias  Rhythm:regular Rate:Normal     Neuro/Psych  Headaches, PSYCHIATRIC DISORDERS Anxiety Depression    GI/Hepatic Neg liver ROS, GERD  Controlled,  Endo/Other  diabetes, Poorly Controlled, Type 2, Insulin Dependent  Renal/GU Renal disease  negative genitourinary   Musculoskeletal  (+) Arthritis ,   Abdominal   Peds  Hematology negative hematology ROS (+)   Anesthesia Other Findings Patient endorses baeseline weakness and numbness in operative arm and shoulder  Past Medical History: No date: Allergy No date: Anemia No date: Anxiety No date: Arthritis No date: Back pain, chronic No date: Cardiomyopathy, dilated, nonischemic (HCC) No date: CHF (congestive heart failure) (HCC) No date: Cholelithiasis No date: Coronary artery disease No date: Depression No date: Diabetes mellitus without complication (HCC)     Comment: fasting cbg 50-140s No date: Dysrhythmia     Comment: tachycardia No date: GERD (gastroesophageal reflux disease)     Comment: otc meds No date: Headache(784.0) No date: Hypercholesteremia No date: Hyperlipemia No date: Hypertension No date: Insulin pump in place     Comment: pt had insulin pump but it is now removed                (02-18-16) No date: MI, old     Comment: 2006 No date: Migraines     Comment: once/month maybe - can last up to two weeks 2006: Myocardial infarction [facility])     Comment: "due to medication"; no evidence of ischemia               or infarction by nuclear stress test '11 No date: Neck pain, chronic No date: Neuropathy ([place]) 2015: Pneumonia     Comment: [facility] No date: Prolonged QT interval syndrome No date: Renal insufficiency No date: Restless leg syndrome, controlled 2016: Sepsis ([place])     Comment: secondary to surgery No date: Sleep apnea     Comment: does not use cpap since losing alot of weight No date: Vision loss     Comment: due to diabetes  Past Surgical History: 2011: BACK SURGERY     Comment: Lumbar  07/18/2004: CARDIAC CATHETERIZATION     Comment: 50-60% mid LAD, minor luminal irregularities               RCA, normal LM and CX, EF 50-55% [facility]) No date: carpel tunnel Bilateral 2006: CERVICAL FUSION 04/08/2015: CHOLECYSTECTOMY N/A     Comment: Procedure: LAPAROSCOPIC CHOLECYSTECTOMY WITH               INTRAOPERATIVE CHOLANGIOGRAM;  Surgeon: Florene Glen, MD;  Location: [facility] ORS;  Service:  General;  Laterality: N/A; 05/07/2013: LUMBAR LAMINECTOMY/DECOMPRESSION MICRODISCECTO* Right     Comment: Procedure: Right Lumbar one-two laminectomy;                Surgeon: Elaina Hoops, MD;  Location: [place] NEURO               ORS;  Service: Neurosurgery;  Laterality:               Right; 10/29/2013: LUMBAR LAMINECTOMY/DECOMPRESSION MICRODISCECTO* Left     Comment: Procedure: Left Lumbar five-Sacral one               Laminectomy;  Surgeon: Elaina Hoops, MD;                Location: [place] NEURO ORS;  Service: Neurosurgery;              Laterality: Left; 01/25/2014: LUMBAR WOUND DEBRIDEMENT N/A     Comment: Procedure: LUMBAR WOUND DEBRIDEMENT;  Surgeon:              Charlie Pitter, MD;  Location: [place] NEURO ORS;                 Service: Neurosurgery;  Laterality: N/A; 02/25/2014: LUMBAR WOUND DEBRIDEMENT N/A     Comment: Procedure: LUMBAR WOUND DEBRIDEMENT;  Surgeon:              Elaina Hoops, MD;  Location: [place] NEURO ORS;                Service: Neurosurgery;  Laterality: N/A; No date: SALPINGOOPHORECTOMY Bilateral     Comment: 1 1997. 2nd 2001 No date: shoulder sugery     Comment: 7/17 rotator cuff  BMI    Body Mass Index:  33.80 kg/m      Reproductive/Obstetrics negative OB ROS                             Anesthesia Physical Anesthesia Plan  ASA: III  Anesthesia Plan: General ETT   Post-op Pain Management:  Regional for Post-op pain   Induction:   Airway Management Planned:   Additional Equipment:   Intra-op Plan:   Post-operative Plan:   Informed Consent: I have reviewed the patients History and Physical, chart, labs and discussed the procedure including the risks, benefits and alternatives for the proposed anesthesia with the patient or authorized representative who has indicated his/her understanding and acceptance.   Dental Advisory Given  Plan Discussed with: Anesthesiologist, CRNA and Surgeon  Anesthesia Plan Comments: (Patient unable to remove dermal piercings, consented for risks of leaving these in.  Patient voices understanding.)        Anesthesia Quick Evaluation

## 2016-03-24 NOTE — Anesthesia Procedure Notes (Signed)
Procedure Name: Intubation Date/Time: 03/24/2016 9:58 AM Performed by: Silvana Newness Pre-anesthesia Checklist: Patient identified, Emergency Drugs available, Suction available, Patient being monitored and Timeout performed Patient Re-evaluated:Patient Re-evaluated prior to inductionOxygen Delivery Method: Circle system utilized Preoxygenation: Pre-oxygenation with 100% oxygen Intubation Type: IV induction Ventilation: Mask ventilation without difficulty Laryngoscope Size: Mac and 3 Grade View: Grade I Tube type: Oral Tube size: 7.0 mm Number of attempts: 1 Airway Equipment and Method: Rigid stylet Placement Confirmation: ETT inserted through vocal cords under direct vision,  positive ETCO2 and breath sounds checked- equal and bilateral Secured at: 19 cm Tube secured with: Tape Dental Injury: Teeth and Oropharynx as per pre-operative assessment

## 2016-03-24 NOTE — Anesthesia Procedure Notes (Addendum)
Anesthesia Regional Block:  Interscalene brachial plexus block  Pre-Anesthetic Checklist: ,, timeout performed, Correct Patient, Correct Site, Correct Laterality, Correct Procedure, Correct Position, site marked, Risks and benefits discussed,  Surgical consent,  Pre-op evaluation,  At surgeon's request and post-op pain management  Laterality: Upper and Right  Prep: chloraprep       Needles:  Injection technique: Single-shot  Needle Type: Stimiplex     Needle Length: 5cm 5 cm Needle Gauge: 22 and 22 G    Additional Needles:  Procedures: ultrasound guided (picture in chart) Interscalene brachial plexus block Narrative:  Start time: 03/24/2016 9:00 AM End time: 03/24/2016 9:02 AM Injection made incrementally with aspirations every 5 mL.  Performed by: Personally  Anesthesiologist: Katy Fitch K  Additional Notes: Patient endorses baseline weakness and numbness in operative arm and shoulder  Functioning IV was confirmed and monitors were applied.  A 39mm 22ga Stimuplex needle was used. Sterile prep,hand hygiene and sterile gloves were used.  Negative aspiration and negative test dose prior to incremental administration of local anesthetic. The patient tolerated the procedure well with no immediate complications.

## 2016-03-24 NOTE — Transfer of Care (Signed)
Immediate Anesthesia Transfer of Care Note  Patient: Marissa [name]  Procedure(s) Performed: Procedure(s): shoulder arthroscopy with biceps TENOTOMY, removal loose body, limited synovectomy (Left)  Patient Location: PACU  Anesthesia Type:General  Level of Consciousness: awake, alert , oriented and patient cooperative  Airway & Oxygen Therapy: Patient Spontanous Breathing and Patient connected to face mask oxygen  Post-op Assessment: Report given to RN, Post -op Vital signs reviewed and stable and Patient moving all extremities X 4  Post vital signs: Reviewed and stable  Last Vitals:  Vitals:   03/24/16 0801  BP: 91/65  Pulse: 72  Resp: 16  Temp: (!) 35.7 C    Last Pain:  Vitals:   03/24/16 0801  TempSrc: Tympanic  PainSc: 5          Complications: No apparent anesthesia complications

## 2016-03-24 NOTE — Anesthesia Postprocedure Evaluation (Signed)
Anesthesia Post Note  Patient: Marissa [name]  Procedure(s) Performed: Procedure(s) (LRB): shoulder arthroscopy with biceps TENOTOMY, removal loose body, limited synovectomy (Left)  Patient location during evaluation: PACU Anesthesia Type: General Level of consciousness: awake and alert Pain management: pain level controlled Vital Signs Assessment: post-procedure vital signs reviewed and stable Respiratory status: spontaneous breathing, nonlabored ventilation, respiratory function stable and patient connected to nasal cannula oxygen Cardiovascular status: blood pressure returned to baseline and stable Postop Assessment: no signs of nausea or vomiting Anesthetic complications: no    Last Vitals:  Vitals:   03/24/16 1234 03/24/16 1258  BP: (!) 87/62 113/78  Pulse: 76 78  Resp: (!) 23 18  Temp: 36.7 C 36.6 C    Last Pain:  Vitals:   03/24/16 1258  TempSrc: Tympanic  PainSc: 8                  Rhianon Zabawa K Leatha Rohner

## 2016-03-24 NOTE — H&P (Signed)
  H and P reviewed. No changes. Uploaded at later date. 

## 2016-03-28 LAB — TOXASSURE SELECT 13 (MW), URINE: PDF: 0

## 2016-03-28 NOTE — Progress Notes (Signed)
Reviewed

## 2016-03-31 DIAGNOSIS — E1142 Type 2 diabetes mellitus with diabetic polyneuropathy: Secondary | ICD-10-CM | POA: Diagnosis not present

## 2016-03-31 DIAGNOSIS — Z794 Long term (current) use of insulin: Secondary | ICD-10-CM | POA: Diagnosis not present

## 2016-03-31 DIAGNOSIS — Z9229 Personal history of other drug therapy: Secondary | ICD-10-CM | POA: Diagnosis not present

## 2016-04-13 ENCOUNTER — Ambulatory Visit: Payer: PPO | Attending: Neurosurgery

## 2016-04-18 DIAGNOSIS — Z794 Long term (current) use of insulin: Secondary | ICD-10-CM | POA: Diagnosis not present

## 2016-04-18 DIAGNOSIS — E1165 Type 2 diabetes mellitus with hyperglycemia: Secondary | ICD-10-CM | POA: Diagnosis not present

## 2016-04-18 DIAGNOSIS — E1142 Type 2 diabetes mellitus with diabetic polyneuropathy: Secondary | ICD-10-CM | POA: Diagnosis not present

## 2016-04-19 ENCOUNTER — Ambulatory Visit: Payer: PPO | Attending: Pain Medicine | Admitting: Pain Medicine

## 2016-04-19 ENCOUNTER — Encounter: Payer: Self-pay | Admitting: Pain Medicine

## 2016-04-19 VITALS — BP 109/66 | HR 84 | Temp 98.0°F | Resp 16 | Ht 65.0 in | Wt 198.0 lb

## 2016-04-19 DIAGNOSIS — E784 Other hyperlipidemia: Secondary | ICD-10-CM | POA: Diagnosis not present

## 2016-04-19 DIAGNOSIS — M5030 Other cervical disc degeneration, unspecified cervical region: Secondary | ICD-10-CM | POA: Diagnosis not present

## 2016-04-19 DIAGNOSIS — G43909 Migraine, unspecified, not intractable, without status migrainosus: Secondary | ICD-10-CM | POA: Insufficient documentation

## 2016-04-19 DIAGNOSIS — R600 Localized edema: Secondary | ICD-10-CM | POA: Diagnosis not present

## 2016-04-19 DIAGNOSIS — M19011 Primary osteoarthritis, right shoulder: Secondary | ICD-10-CM | POA: Insufficient documentation

## 2016-04-19 DIAGNOSIS — I428 Other cardiomyopathies: Secondary | ICD-10-CM | POA: Diagnosis not present

## 2016-04-19 DIAGNOSIS — M5416 Radiculopathy, lumbar region: Secondary | ICD-10-CM | POA: Diagnosis not present

## 2016-04-19 DIAGNOSIS — F329 Major depressive disorder, single episode, unspecified: Secondary | ICD-10-CM | POA: Diagnosis not present

## 2016-04-19 DIAGNOSIS — R0602 Shortness of breath: Secondary | ICD-10-CM | POA: Diagnosis not present

## 2016-04-19 DIAGNOSIS — E1340 Other specified diabetes mellitus with diabetic neuropathy, unspecified: Secondary | ICD-10-CM

## 2016-04-19 DIAGNOSIS — M542 Cervicalgia: Secondary | ICD-10-CM | POA: Diagnosis not present

## 2016-04-19 DIAGNOSIS — M5481 Occipital neuralgia: Secondary | ICD-10-CM | POA: Diagnosis not present

## 2016-04-19 DIAGNOSIS — I2510 Atherosclerotic heart disease of native coronary artery without angina pectoris: Secondary | ICD-10-CM | POA: Diagnosis not present

## 2016-04-19 DIAGNOSIS — M5136 Other intervertebral disc degeneration, lumbar region: Secondary | ICD-10-CM | POA: Insufficient documentation

## 2016-04-19 DIAGNOSIS — Z9889 Other specified postprocedural states: Secondary | ICD-10-CM | POA: Insufficient documentation

## 2016-04-19 DIAGNOSIS — M75101 Unspecified rotator cuff tear or rupture of right shoulder, not specified as traumatic: Secondary | ICD-10-CM | POA: Diagnosis not present

## 2016-04-19 DIAGNOSIS — M47817 Spondylosis without myelopathy or radiculopathy, lumbosacral region: Secondary | ICD-10-CM | POA: Diagnosis not present

## 2016-04-19 DIAGNOSIS — M5134 Other intervertebral disc degeneration, thoracic region: Secondary | ICD-10-CM

## 2016-04-19 DIAGNOSIS — I10 Essential (primary) hypertension: Secondary | ICD-10-CM | POA: Diagnosis not present

## 2016-04-19 DIAGNOSIS — I509 Heart failure, unspecified: Secondary | ICD-10-CM | POA: Diagnosis not present

## 2016-04-19 DIAGNOSIS — M545 Low back pain: Secondary | ICD-10-CM | POA: Diagnosis not present

## 2016-04-19 DIAGNOSIS — R51 Headache: Secondary | ICD-10-CM | POA: Diagnosis not present

## 2016-04-19 DIAGNOSIS — I429 Cardiomyopathy, unspecified: Secondary | ICD-10-CM | POA: Diagnosis not present

## 2016-04-19 DIAGNOSIS — M533 Sacrococcygeal disorders, not elsewhere classified: Secondary | ICD-10-CM | POA: Diagnosis not present

## 2016-04-19 DIAGNOSIS — M5137 Other intervertebral disc degeneration, lumbosacral region: Secondary | ICD-10-CM

## 2016-04-19 DIAGNOSIS — E119 Type 2 diabetes mellitus without complications: Secondary | ICD-10-CM | POA: Diagnosis not present

## 2016-04-19 MED ORDER — OXYCODONE HCL 15 MG PO TABS: ORAL_TABLET | ORAL | 0 refills | Status: DC

## 2016-04-19 MED ORDER — TIZANIDINE HCL 4 MG PO TABS
ORAL_TABLET | ORAL | 0 refills | Status: DC
Start: 2016-04-19 — End: 2022-01-18

## 2016-04-19 NOTE — Progress Notes (Signed)
Patient here for medication management Safety precautions to be maintained throughout the outpatient stay will include: orient to surroundings, keep bed in low position, maintain call bell within reach at all times, provide assistance with transfer out of bed and ambulation.  

## 2016-04-19 NOTE — Patient Instructions (Signed)
PLAN    Continue present medications. tizanidine and oxycodone  F/U PCP  Dr Richarda Overlie for evaliation of  BP and general medical  Condition.  F/U surgical evaluation. Follow-up with Dr. [name] as discussed   F/U Dr.  Franchot Mimes regarding surgery of shoulder  F/U Dr Clayborn Bigness   Recommend deep myofascial massage/release 2 times per week with caution to avoid aggravation of symptoms as previously discussed  F/U neurological evaluation. May consider [facility] EMG studies and other studies. We will avoid such studies at this time  May consider radiofrequency rhizolysis or intraspinal procedures pending response to present treatment and F/U evaluation. We will avoid such studies at this time  Patient to call [facility] should patient have concerns prior to scheduled return appointment.

## 2016-04-19 NOTE — Progress Notes (Signed)
The patient is a 40 year old female who returns to pain management for further evaluation and treatment of pain involving the neck upper extremity regions lower back lower extremity region with prior surgical intervention of the cervical region and lumbar regions area patient is status post recent surgical intervention of the right shoulder performed by Dr. Franchot Mimes . We discussed patient's condition and present time we will continue medications as prescribed. The patient will also follow-up with her surgeon regarding additional medications for treatment of her pain if necessary. We informed patient that we wish to continue medications as presently prescribed and that if there was any pain unresponsive to the present medication regimen that the patient should address this with neurosurgeon Dr. Jefm Bryant . The patient denied any trauma change in events of daily living the call significant change in symptomatology. We will continue Zanaflex and oxycodone as prescribed this time and consider modification of treatment regimen pending follow-up evaluation. All agreed to suggested treatment plan    Physical examination  There was tenderness of the cervical facet cervical paraspinal musculature region of moderately severe degree with well-healed surgical scar of the cervical region without increased warmth and erythema in the region scar. The patient appeared to be unremarkable Spurling's maneuver and was with right upper extremity in sling. The patient appeared to be with moderate tenderness to palpation of the cervical and thoracic paraspinal musculature region with no crepitus of the thoracic region noted. There was slightly decreased grip strength with Tinel and Phalen's maneuver reproducing pain of mild to moderate degree. Palpation over the lumbar paraspinal musculatures and lumbar facet region was attends to palpation of moderate degree EHL strength appeared to be decreased there was questionably  decreased sensation of the L5 dermatomal dystrophy is with negative clonus negative Homans. Palpation over the thoracic paraspinal musculature region reproduces moderate discomfort. There appeared to be decreased sensation of the lower extremity and L5 dermatomal distribution . There was negative clonus negative Homans. Abdomen was nontender with no costovertebral tenderness noted    Assessment   Degenerative joint disease of the right shoulder Severe appearing rotator cuff tendinopathy with a deep undersurface tear of the anterior and far lateral supraspinatus measuring approximately 0.8 cm front to back with 1-2 cm of laminar retraction. No atrophy. Severe tendinopathy and possible longitudinal split tearing of the intra-articular segment of the long head of biceps. Advanced for age acromioclavicular osteoarthritis. Prominent subacromial/subdeltoid fluid consistent with bursitis.  Status post surgery of right shoulder  Degenerative disc disease of the cervical spine  Status post surgical intervention of the cervical region  Cervical facet syndrome  Degenerative disc disease lumbar spine  Lumbar facet syndrome  Migraine headaches  Bilateral occipital neuralgia      PLAN    Continue present medications. tizanidine and oxycodone  F/U PCP  Dr Richarda Overlie for evaliation of  BP and general medical  Condition.  F/U surgical evaluation. Follow-up with Dr. [name] as discussed   F/U Dr.  Franchot Mimes regarding surgery of shoulder  F/U Dr Clayborn Bigness   Recommend deep myofascial massage/release 2 times per week with caution to avoid aggravation of symptoms as previously discussed. At the present time patient will proceed with physical therapy as directed by Dr. Little Ishikawa  F/U neurological evaluation. May consider [facility] EMG studies and other studies. We will avoid such studies at this time  May consider radiofrequency rhizolysis or intraspinal procedures pending response to  present treatment and F/U evaluation. We will avoid such  studies at this time  Patient to call [facility] Management Center should patient have concerns prior to scheduled return appointment.

## 2016-05-18 DIAGNOSIS — M47817 Spondylosis without myelopathy or radiculopathy, lumbosacral region: Secondary | ICD-10-CM | POA: Diagnosis not present

## 2016-05-18 DIAGNOSIS — M5481 Occipital neuralgia: Secondary | ICD-10-CM | POA: Diagnosis not present

## 2016-05-18 DIAGNOSIS — M5416 Radiculopathy, lumbar region: Secondary | ICD-10-CM | POA: Diagnosis not present

## 2016-05-18 DIAGNOSIS — M533 Sacrococcygeal disorders, not elsewhere classified: Secondary | ICD-10-CM | POA: Diagnosis not present

## 2016-06-01 DIAGNOSIS — Z794 Long term (current) use of insulin: Secondary | ICD-10-CM | POA: Diagnosis not present

## 2016-06-01 DIAGNOSIS — E1142 Type 2 diabetes mellitus with diabetic polyneuropathy: Secondary | ICD-10-CM | POA: Diagnosis not present

## 2016-06-01 DIAGNOSIS — E1165 Type 2 diabetes mellitus with hyperglycemia: Secondary | ICD-10-CM | POA: Diagnosis not present

## 2016-06-01 DIAGNOSIS — E11649 Type 2 diabetes mellitus with hypoglycemia without coma: Secondary | ICD-10-CM | POA: Diagnosis not present

## 2016-06-01 DIAGNOSIS — E559 Vitamin D deficiency, unspecified: Secondary | ICD-10-CM | POA: Diagnosis not present

## 2016-06-01 DIAGNOSIS — E669 Obesity, unspecified: Secondary | ICD-10-CM | POA: Diagnosis not present

## 2016-06-19 DIAGNOSIS — M5416 Radiculopathy, lumbar region: Secondary | ICD-10-CM | POA: Diagnosis not present

## 2016-06-19 DIAGNOSIS — M47817 Spondylosis without myelopathy or radiculopathy, lumbosacral region: Secondary | ICD-10-CM | POA: Diagnosis not present

## 2016-06-19 DIAGNOSIS — M533 Sacrococcygeal disorders, not elsewhere classified: Secondary | ICD-10-CM | POA: Diagnosis not present

## 2016-06-19 DIAGNOSIS — M5481 Occipital neuralgia: Secondary | ICD-10-CM | POA: Diagnosis not present

## 2016-06-20 DIAGNOSIS — M7541 Impingement syndrome of right shoulder: Secondary | ICD-10-CM | POA: Diagnosis not present

## 2016-06-20 DIAGNOSIS — M7502 Adhesive capsulitis of left shoulder: Secondary | ICD-10-CM | POA: Diagnosis not present

## 2016-07-12 DIAGNOSIS — M7541 Impingement syndrome of right shoulder: Secondary | ICD-10-CM | POA: Diagnosis not present

## 2016-07-12 DIAGNOSIS — M25611 Stiffness of right shoulder, not elsewhere classified: Secondary | ICD-10-CM | POA: Diagnosis not present

## 2016-07-12 DIAGNOSIS — M6281 Muscle weakness (generalized): Secondary | ICD-10-CM | POA: Diagnosis not present

## 2016-07-18 DIAGNOSIS — M5481 Occipital neuralgia: Secondary | ICD-10-CM | POA: Diagnosis not present

## 2016-07-18 DIAGNOSIS — M5416 Radiculopathy, lumbar region: Secondary | ICD-10-CM | POA: Diagnosis not present

## 2016-07-18 DIAGNOSIS — M47817 Spondylosis without myelopathy or radiculopathy, lumbosacral region: Secondary | ICD-10-CM | POA: Diagnosis not present

## 2016-07-18 DIAGNOSIS — M533 Sacrococcygeal disorders, not elsewhere classified: Secondary | ICD-10-CM | POA: Diagnosis not present

## 2016-08-01 DIAGNOSIS — M5023 Other cervical disc displacement, cervicothoracic region: Secondary | ICD-10-CM | POA: Diagnosis not present

## 2016-08-01 DIAGNOSIS — G061 Intraspinal abscess and granuloma: Secondary | ICD-10-CM | POA: Diagnosis not present

## 2016-08-01 DIAGNOSIS — M5137 Other intervertebral disc degeneration, lumbosacral region: Secondary | ICD-10-CM | POA: Diagnosis not present

## 2016-08-01 DIAGNOSIS — R29898 Other symptoms and signs involving the musculoskeletal system: Secondary | ICD-10-CM | POA: Diagnosis not present

## 2016-08-10 DIAGNOSIS — M48061 Spinal stenosis, lumbar region without neurogenic claudication: Secondary | ICD-10-CM | POA: Diagnosis not present

## 2016-08-10 DIAGNOSIS — M5137 Other intervertebral disc degeneration, lumbosacral region: Secondary | ICD-10-CM | POA: Diagnosis not present

## 2016-08-10 DIAGNOSIS — R29898 Other symptoms and signs involving the musculoskeletal system: Secondary | ICD-10-CM | POA: Diagnosis not present

## 2016-08-15 ENCOUNTER — Other Ambulatory Visit: Payer: Self-pay | Admitting: Pain Medicine

## 2016-08-15 DIAGNOSIS — M533 Sacrococcygeal disorders, not elsewhere classified: Secondary | ICD-10-CM | POA: Diagnosis not present

## 2016-08-15 DIAGNOSIS — M5416 Radiculopathy, lumbar region: Secondary | ICD-10-CM | POA: Diagnosis not present

## 2016-08-15 DIAGNOSIS — M47817 Spondylosis without myelopathy or radiculopathy, lumbosacral region: Secondary | ICD-10-CM | POA: Diagnosis not present

## 2016-08-15 DIAGNOSIS — M5481 Occipital neuralgia: Secondary | ICD-10-CM | POA: Diagnosis not present

## 2016-08-22 DIAGNOSIS — M5137 Other intervertebral disc degeneration, lumbosacral region: Secondary | ICD-10-CM | POA: Diagnosis not present

## 2016-08-30 ENCOUNTER — Ambulatory Visit: Payer: PPO | Admitting: Neurology

## 2016-08-31 ENCOUNTER — Ambulatory Visit: Payer: PPO | Admitting: Neurology

## 2016-09-04 DIAGNOSIS — Z794 Long term (current) use of insulin: Secondary | ICD-10-CM | POA: Diagnosis not present

## 2016-09-04 DIAGNOSIS — D510 Vitamin B12 deficiency anemia due to intrinsic factor deficiency: Secondary | ICD-10-CM | POA: Diagnosis not present

## 2016-09-04 DIAGNOSIS — E1165 Type 2 diabetes mellitus with hyperglycemia: Secondary | ICD-10-CM | POA: Diagnosis not present

## 2016-09-04 DIAGNOSIS — E559 Vitamin D deficiency, unspecified: Secondary | ICD-10-CM | POA: Diagnosis not present

## 2016-09-05 DIAGNOSIS — Z6834 Body mass index (BMI) 34.0-34.9, adult: Secondary | ICD-10-CM | POA: Diagnosis not present

## 2016-09-05 DIAGNOSIS — E1142 Type 2 diabetes mellitus with diabetic polyneuropathy: Secondary | ICD-10-CM | POA: Diagnosis not present

## 2016-09-05 DIAGNOSIS — E559 Vitamin D deficiency, unspecified: Secondary | ICD-10-CM | POA: Diagnosis not present

## 2016-09-05 DIAGNOSIS — D510 Vitamin B12 deficiency anemia due to intrinsic factor deficiency: Secondary | ICD-10-CM | POA: Diagnosis not present

## 2016-09-05 DIAGNOSIS — E669 Obesity, unspecified: Secondary | ICD-10-CM | POA: Diagnosis not present

## 2016-09-05 DIAGNOSIS — E11649 Type 2 diabetes mellitus with hypoglycemia without coma: Secondary | ICD-10-CM | POA: Diagnosis not present

## 2016-09-06 ENCOUNTER — Telehealth: Payer: Self-pay | Admitting: *Deleted

## 2016-09-06 ENCOUNTER — Encounter: Payer: Self-pay | Admitting: Neurology

## 2016-09-06 ENCOUNTER — Ambulatory Visit (INDEPENDENT_AMBULATORY_CARE_PROVIDER_SITE_OTHER): Payer: PPO | Admitting: Neurology

## 2016-09-06 VITALS — BP 162/100 | HR 99 | Ht 65.0 in | Wt 216.5 lb

## 2016-09-06 DIAGNOSIS — M5137 Other intervertebral disc degeneration, lumbosacral region: Secondary | ICD-10-CM | POA: Diagnosis not present

## 2016-09-06 DIAGNOSIS — R202 Paresthesia of skin: Secondary | ICD-10-CM | POA: Insufficient documentation

## 2016-09-06 NOTE — Telephone Encounter (Signed)
Patient came in office and wanted a phone call when her MRI report is received our office . It is being faxed from her previous provider office.  She states that Dr Jannifer Franklin is waiting on that report.and she wants to make sure that report has come in . Her contact number is [phone]. Please advise. Thank you

## 2016-09-06 NOTE — Progress Notes (Signed)
Reason for visit: Back and leg pain, numbness in the feet  Referring physician: Dr. [name] is a 41 y.o. female  History of present illness:  Marissa [name] is a 41 year old right-handed black female with a history of diabetes. The patient has had prior cervical and lumbosacral spine surgery in the past. The patient does have some chronic discomfort in the back and neck and arms following the surgeries, but about 2 months ago she began having increasing problems with right hip discomfort with pain going into the right groin and some numbness in this distribution. The patient also began having some numbness in the feet up to the knees bilaterally. The patient has discomfort with her she is walking, sitting, or lying down. She denies any changes in bowel or bladder control. She does have some mild alteration in balance, she has not had any falls. She also reports some numbness in the hands bilaterally. The patient is on Lyrica and oxycodone for discomfort without complete control. She has been seen and evaluated by Dr. [name] and MRI evaluation of the thoracic and lumbar spine has been done. The results of the studies are not available to me. The patient is sent to this office for further evaluation.  Past Medical History:  Diagnosis Date  . Allergy   . Anemia   . Anxiety   . Arthritis   . Back pain, chronic   . Cardiomyopathy, dilated, nonischemic ([place])   . CHF (congestive heart failure) ([place])   . Cholelithiasis   . Coronary artery disease   . Depression   . Diabetes mellitus without complication (HCC)    fasting cbg 50-140s  . Dysrhythmia    tachycardia  . GERD (gastroesophageal reflux disease)    otc meds  . Headache(784.0)   . Hypercholesteremia   . Hyperlipemia   . Hypertension   . Insulin pump in place    pt had insulin pump but it is now removed (02-18-16)  . MI, old    2006  . Migraines    once/month maybe - can last up to two weeks  . Myocardial infarction  2006   "due to medication"; no evidence of ischemia or infarction by nuclear stress test '11  . Neck pain, chronic   . Neuropathy ([place])   . Pneumonia 2015   [facility]  . Prolonged QT interval syndrome   . Renal insufficiency   . Restless leg syndrome, controlled   . Sepsis ([place]) 2016   secondary to surgery  . Sleep apnea    does not use cpap since losing alot of weight  . Vision loss    due to diabetes    Past Surgical History:  Procedure Laterality Date  . BACK SURGERY  2011   Lumbar   . CARDIAC CATHETERIZATION  07/18/2004   50-60% mid LAD, minor luminal irregularities RCA, normal LM and CX, EF 50-55% [facility])  . carpel tunnel Bilateral   . CERVICAL FUSION  2006  . CHOLECYSTECTOMY N/A 04/08/2015   Procedure: LAPAROSCOPIC CHOLECYSTECTOMY WITH INTRAOPERATIVE CHOLANGIOGRAM;  Surgeon: Florene Glen, MD;  Location: [facility] ORS;  Service: General;  Laterality: N/A;  . LUMBAR LAMINECTOMY/DECOMPRESSION MICRODISCECTOMY Right 05/07/2013   Procedure: Right Lumbar one-two laminectomy;  Surgeon: Elaina Hoops, MD;  Location: [place] NEURO ORS;  Service: Neurosurgery;  Laterality: Right;  . LUMBAR LAMINECTOMY/DECOMPRESSION MICRODISCECTOMY Left 10/29/2013   Procedure: Left Lumbar five-Sacral one Laminectomy;  Surgeon: Elaina Hoops, MD;  Location: [place] NEURO ORS;  Service: Neurosurgery;  Laterality: Left;  . LUMBAR WOUND DEBRIDEMENT N/A 01/25/2014   Procedure: LUMBAR WOUND DEBRIDEMENT;  Surgeon: Charlie Pitter, MD;  Location: [place] NEURO ORS;  Service: Neurosurgery;  Laterality: N/A;  . LUMBAR WOUND DEBRIDEMENT N/A 02/25/2014   Procedure: LUMBAR WOUND DEBRIDEMENT;  Surgeon: Elaina Hoops, MD;  Location: [place] NEURO ORS;  Service: Neurosurgery;  Laterality: N/A;  . SALPINGOOPHORECTOMY Bilateral    1 1997. 2nd 2001  . SHOULDER ARTHROSCOPY WITH BICEPSTENOTOMY Left 03/24/2016   Procedure: shoulder arthroscopy with biceps TENOTOMY, removal loose body, limited synovectomy;  Surgeon: Leanor Kail, MD;   Location: [facility] ORS;  Service: Orthopedics;  Laterality: Left;  . shoulder sugery     7/17 rotator cuff    Family History  Problem Relation Age of Onset  . Depression Mother   . Drug abuse Mother   . Early death Mother   . Hypertension Mother   . Varicose Veins Mother   . Diabetes Father   . Early death Father   . Hyperlipidemia Father   . Heart Problems Father     enlarged geart    Social history:  reports that she has never smoked. She has never used smokeless tobacco. She reports that she does not drink alcohol or use drugs.  Medications:  Prior to Admission medications   Medication Sig Start Date End Date Taking? Authorizing Provider  amitriptyline (ELAVIL) 100 MG tablet Take 100 mg by mouth at bedtime.    Yes Historical Provider, MD  aspirin-acetaminophen-caffeine (EXCEDRIN MIGRAINE) [id number] MG tablet Take 1 tablet by mouth every 6 (six) hours as needed for headache.   Yes Historical Provider, MD  carvedilol (COREG) 25 MG tablet Take 1 tablet (25 mg total) by mouth 2 (two) times daily with a meal. 02/04/14  Yes Kinnie Feil, MD  cloNIDine (CATAPRES) 0.2 MG tablet Take 0.2 mg by mouth 2 (two) times daily. 02/10/16  Yes Historical Provider, MD  Cyanocobalamin (B-12 IJ) Inject 1,000 mcg as directed every 30 (thirty) days.   Yes Historical Provider, MD  gabapentin (NEURONTIN) 300 MG capsule Take 600 mg by mouth 3 (three) times daily.    Yes Historical Provider, MD  insulin aspart (NOVOLOG FLEXPEN) 100 UNIT/ML FlexPen Inject 10-20 Units into the skin 3 (three) times daily with meals. Sliding scale 02/04/14  Yes Kinnie Feil, MD  lisinopril (PRINIVIL,ZESTRIL) 10 MG tablet Take 10 mg by mouth at bedtime. Reported on 10/19/2015   Yes Historical Provider, MD  nitroGLYCERIN (NITROSTAT) 0.4 MG SL tablet Place 0.4 mg under the tongue every 5 (five) minutes as needed for chest pain.   Yes Historical Provider, MD  oxyCODONE (ROXICODONE) 15 MG immediate release tablet Limit 1/2-1 tab by  mouth 4-7 times per day if tolerated 04/19/16  Yes Mohammed Kindle, MD  rOPINIRole (REQUIP) 0.25 MG tablet Take 0.25 mg by mouth 2 (two) times daily.   Yes Historical Provider, MD  spironolactone (ALDACTONE) 25 MG tablet Take 25 mg by mouth daily. Reported on 09/21/2015 03/24/15  Yes Historical Provider, MD  tiZANidine (ZANAFLEX) 4 MG tablet Limit 1 tab by mouth per day or 2-3 times per day if tolerated 04/19/16  Yes Mohammed Kindle, MD  topiramate (TOPAMAX) 100 MG tablet Take 100 mg by mouth at bedtime. Take with 25 mg tablet for a 125 mg dose   Yes Historical Provider, MD  topiramate (TOPAMAX) 25 MG tablet Take 25 mg by mouth at bedtime.   Yes Historical Provider, MD  TRULICITY 1.5 0000000 SOPN INJECT  1.5 MG SUBCUTANEOUSLY EVERY 7 (SEVEN) DAYS. 08/20/16  Yes Historical Provider, MD  Vitamin D, Ergocalciferol, (DRISDOL) 50000 units CAPS capsule Take by mouth. 09/05/16 10/05/16 Yes Historical Provider, MD      Allergies  Allergen Reactions  . Bactrim [Sulfamethoxazole-Trimethoprim] Nausea And Vomiting  . Ciprofloxacin Swelling  . Rocephin [Ceftriaxone Sodium In Dextrose] Nausea And Vomiting  . Vancomycin Nausea And Vomiting  . Amoxicillin Nausea And Vomiting  . Ceftriaxone Nausea Only and Nausea And Vomiting  . Azithromycin Itching and Rash  . Food Itching and Rash    "[place] yogurt"    ROS:  Out of a complete 14 system review of symptoms, the patient complains only of the following symptoms, and all other reviewed systems are negative.  Weight gain Anemia Headache, numbness, weakness Joint pain Disinterest in activities Insomnia, sleepiness, snoring, restless legs  Blood pressure (!) 162/100, pulse 99, height 5\' 5"  (1.651 m), weight 216 lb 8 oz (98.2 kg), last menstrual period 12/21/2014.  Physical Exam  General: The patient is alert and cooperative at the time of the examination.  Eyes: Pupils are equal, round, and reactive to light. Discs are flat bilaterally.  Neck: The neck is  supple, no carotid bruits are noted.  Respiratory: The respiratory examination is clear.  Cardiovascular: The cardiovascular examination reveals a regular rate and rhythm, no obvious murmurs or rubs are noted.  Neuromuscular: Patient is able to flex the low back to about 90.  Skin: Extremities are without significant edema.  Neurologic Exam  Mental status: The patient is alert and oriented x 3 at the time of the examination. The patient has apparent normal recent and remote memory, with an apparently normal attention span and concentration ability.  Cranial nerves: Facial symmetry is present. There is good sensation of the face to pinprick and soft touch bilaterally. The strength of the facial muscles and the muscles to head turning and shoulder shrug are normal bilaterally. Speech is well enunciated, no aphasia or dysarthria is noted. Extraocular movements are full. Visual fields are full. The tongue is midline, and the patient has symmetric elevation of the soft palate. No obvious hearing deficits are noted.  Motor: The motor testing reveals 5 over 5 strength of the upper extremities. With the lower extremities, there appears to be diffuse giveaway type weakness with both legs, worse on the right. No definite objective weakness was seen. Good symmetric motor tone is noted throughout.  Sensory: Sensory testing is intact to pinprick, soft touch, vibration sensation, and position sense on all 4 extremities, no definite stocking pattern pinprick sensory deficit was seen on either side. No evidence of extinction is noted.  Coordination: Cerebellar testing reveals good finger-nose-finger and heel-to-shin bilaterally.  Gait and station: Gait is normal. Tandem gait is unsteady. Romberg is negative. No drift is seen.  Reflexes: Deep tendon reflexes are symmetric, but are depressed bilaterally. Toes are downgoing bilaterally.   Assessment/Plan:  1. Back pain, right leg discomfort, bilateral  lower extremity numbness  2. Prior lumbosacral and cervical spine surgery  The patient is having some increased pain and discomfort in the back and going down the right leg, with numbness in both feet. The patient does have diabetes, but the clinical examination does not show definite signs of a peripheral neuropathy. The patient will be set up for nerve conduction studies of both legs and one arm, EMG on the right leg. We will request the results of the MRI of the cervical spine and thoracic spine done through Dr.  [name]. The patient will follow-up for the EMG evaluation.  Jill Alexanders MD 09/06/2016 2:28 PM  [facility] [address] [place], [place] [address]  Phone [phone] Fax [phone]

## 2016-09-06 NOTE — Patient Instructions (Signed)
  We will get EMG and NCV evaluation of the legs to look at nerve function.

## 2016-09-06 NOTE — Telephone Encounter (Signed)
Patient was seen in office today by Dr Jannifer Franklin. Patient states that Dr Jannifer Franklin is waiting on a MRI report from her previous provider. The report is suppose to be faxed to our office. Patient is wanting Dr Jannifer Franklin to call her to go over the results with her when he gets the reports. Pt is requesting a call back. Her number is [phone]. Please advise provider. Thank you .

## 2016-09-07 ENCOUNTER — Telehealth: Payer: Self-pay | Admitting: Neurology

## 2016-09-07 NOTE — Telephone Encounter (Signed)
-----   Message from Kathrynn Ducking, MD sent at 09/06/2016  2:34 PM EST ----- Please call the office of Dr. [name] and get the reports of the MRI of the thoracic and lumbar spine done recently sent to our office. Thank you.

## 2016-09-07 NOTE — Telephone Encounter (Signed)
Called Dr. Windy Carina office at [phone] and spoke to his nurse, Junie Panning.  She is going to ask Dr. [name] to sign off on the patient's scans and then fax the results to [phone].  Pt had cervical CT on 12/07/15, lumbar MRI on 08/10/16 and thoracic MRI on 08/10/16.  Junie Panning will return my call, if there is an issue with getting the results sent over.

## 2016-09-07 NOTE — Telephone Encounter (Signed)
Records received and provided to Dr. Jannifer Franklin to review.

## 2016-09-07 NOTE — Telephone Encounter (Signed)
I have received the results of the MRI of the lumbar spine and thoracic spine. These studies do not show any significant abnormalities within the thoracic spine with a small disc protrusion at the thoracic level T7-8 without significant cord compression. There are surgical changes at the L1-2 and at the L5-S1 levels without evidence of impingement of the nerve roots. There is moderate spinal stenosis at the L4-5 level.  I tried to call the patient regarding results of the MRI, unable to leave a message. I will see her in follow-up for the EMG study, I'll review the results at that time.

## 2016-09-08 NOTE — Telephone Encounter (Signed)
Pt called, I relayed your message. She asked if you would call her back today. She doesn't want to wait until next month for the results. She can be reached at [phone]  Thank you

## 2016-09-08 NOTE — Telephone Encounter (Signed)
I called the patient back, one over the results of the MRI, we will see the patient back for EMG evaluation.

## 2016-09-12 ENCOUNTER — Other Ambulatory Visit: Payer: Self-pay | Admitting: Pain Medicine

## 2016-09-12 DIAGNOSIS — M5416 Radiculopathy, lumbar region: Secondary | ICD-10-CM | POA: Diagnosis not present

## 2016-09-12 DIAGNOSIS — R51 Headache: Secondary | ICD-10-CM | POA: Diagnosis not present

## 2016-09-12 DIAGNOSIS — R0602 Shortness of breath: Secondary | ICD-10-CM | POA: Diagnosis not present

## 2016-09-12 DIAGNOSIS — I10 Essential (primary) hypertension: Secondary | ICD-10-CM | POA: Diagnosis not present

## 2016-09-12 DIAGNOSIS — M47817 Spondylosis without myelopathy or radiculopathy, lumbosacral region: Secondary | ICD-10-CM | POA: Diagnosis not present

## 2016-09-12 DIAGNOSIS — Z01818 Encounter for other preprocedural examination: Secondary | ICD-10-CM | POA: Diagnosis not present

## 2016-09-12 DIAGNOSIS — E119 Type 2 diabetes mellitus without complications: Secondary | ICD-10-CM | POA: Diagnosis not present

## 2016-09-12 DIAGNOSIS — M533 Sacrococcygeal disorders, not elsewhere classified: Secondary | ICD-10-CM | POA: Diagnosis not present

## 2016-09-12 DIAGNOSIS — I2510 Atherosclerotic heart disease of native coronary artery without angina pectoris: Secondary | ICD-10-CM | POA: Diagnosis not present

## 2016-09-12 DIAGNOSIS — E784 Other hyperlipidemia: Secondary | ICD-10-CM | POA: Diagnosis not present

## 2016-09-12 DIAGNOSIS — M545 Low back pain: Secondary | ICD-10-CM | POA: Diagnosis not present

## 2016-09-12 DIAGNOSIS — I509 Heart failure, unspecified: Secondary | ICD-10-CM | POA: Diagnosis not present

## 2016-09-12 DIAGNOSIS — R600 Localized edema: Secondary | ICD-10-CM | POA: Diagnosis not present

## 2016-09-12 DIAGNOSIS — M5481 Occipital neuralgia: Secondary | ICD-10-CM | POA: Diagnosis not present

## 2016-09-12 DIAGNOSIS — F329 Major depressive disorder, single episode, unspecified: Secondary | ICD-10-CM | POA: Diagnosis not present

## 2016-09-12 DIAGNOSIS — I428 Other cardiomyopathies: Secondary | ICD-10-CM | POA: Diagnosis not present

## 2016-09-14 ENCOUNTER — Ambulatory Visit: Payer: PPO | Admitting: Neurology

## 2016-09-25 ENCOUNTER — Ambulatory Visit (INDEPENDENT_AMBULATORY_CARE_PROVIDER_SITE_OTHER): Payer: Self-pay | Admitting: Neurology

## 2016-09-25 ENCOUNTER — Ambulatory Visit (INDEPENDENT_AMBULATORY_CARE_PROVIDER_SITE_OTHER): Payer: PPO | Admitting: Neurology

## 2016-09-25 ENCOUNTER — Encounter: Payer: Self-pay | Admitting: Neurology

## 2016-09-25 DIAGNOSIS — M5137 Other intervertebral disc degeneration, lumbosacral region: Secondary | ICD-10-CM

## 2016-09-25 DIAGNOSIS — R202 Paresthesia of skin: Secondary | ICD-10-CM | POA: Diagnosis not present

## 2016-09-25 DIAGNOSIS — M25551 Pain in right hip: Secondary | ICD-10-CM | POA: Diagnosis not present

## 2016-09-25 NOTE — Progress Notes (Signed)
Please refer to EMG and NCV procedure note. 

## 2016-09-25 NOTE — Progress Notes (Signed)
The patient comes in for EMG and nerve conduction study evaluation today. No definite peripheral neuropathy is seen, the patient may have a borderline left carpal tunnel syndrome. She is having ongoing issues with right hip and leg discomfort. The patient will be sent for  The EMG and nerve conduction study does not show evidence of a lumbosacral radiculopathy.  The patient will be sent for blood work today, she will have x-ray of the right hip. If these studies are unremarkable, we may consider an epidural steroid injection to see if this helps the discomfort.

## 2016-09-25 NOTE — Procedures (Signed)
     HISTORY:  Marissa [name] is a 41 year old patient with a history of prior lumbosacral spine surgery. The patient has had right hip and leg discomfort that has persisted. She does have a history of diabetes, she reports some paresthesias in the lower extremities bilaterally.   NERVE CONDUCTION STUDIES:  Nerve conduction studies were performed on the left upper extremity. The distal motor latency for the left median nerve was prolonged with a normal motor amplitude seen. The distal motor latency and motor amplitudes for the left ulnar nerve were normal. The F wave latencies and nerve conduction velocities for the left median and ulnar nerves were normal. The sensory latencies for the left median and ulnar nerves were normal.  Nerve conduction studies were performed on both lower extremities. The distal motor latencies for the peroneal and posterior tibial nerves were normal bilaterally with low motor amplitudes for the peroneal nerves bilaterally, normal for the posterior tibial nerves bilaterally. The nerve conduction velocities for the peroneal and posterior tibial nerves were normal bilaterally. The F wave latencies for the peroneal and posterior tibial nerves were normal bilaterally and the H reflex latencies were normal bilaterally. The peroneal sensory latencies were normal bilaterally.  EMG STUDIES:  EMG study was performed on the right lower extremity:  The tibialis anterior muscle reveals 2 to 3K motor units with full recruitment. No fibrillations or positive waves were seen. The peroneus tertius muscle reveals 2 to 3K motor units with full recruitment. No fibrillations or positive waves were seen. The medial gastrocnemius muscle reveals 1 to 3K motor units with full recruitment. No fibrillations or positive waves were seen. The vastus lateralis muscle reveals 2 to 3K motor units with full recruitment. No fibrillations or positive waves were seen. The iliopsoas muscle reveals 2 to 3K  motor units with full recruitment. No fibrillations or positive waves were seen. The biceps femoris muscle (long head) reveals 2 to 4K motor units with full recruitment. No fibrillations or positive waves were seen. The lumbosacral paraspinal muscles were tested at 3 levels, and revealed no abnormalities of insertional activity at all 3 levels tested. There was good relaxation.   IMPRESSION:  Nerve conduction studies done on the left upper extremity and both lower extremities shows no clear evidence of a peripheral neuropathy. There appears to be a borderline left carpal tunnel syndrome and some lowering of motor amplitudes for the peroneal nerves bilaterally. EMG of the right lower extremity does not show evidence of a chronic or an acute lumbosacral radiculopathy.  Jill Alexanders MD 09/25/2016 3:53 PM  [facility] [address] [place], [place] [address]  Phone [phone] Fax [phone]

## 2016-09-26 LAB — RPR: RPR Ser Ql: NONREACTIVE

## 2016-09-26 LAB — ANA W/REFLEX: ANA: NEGATIVE

## 2016-09-26 LAB — SEDIMENTATION RATE: Sed Rate: 19 mm/hr (ref 0–32)

## 2016-09-26 LAB — B. BURGDORFI ANTIBODIES

## 2016-09-26 LAB — VITAMIN B12

## 2016-09-26 LAB — ANGIOTENSIN CONVERTING ENZYME: Angio Convert Enzyme: 47 U/L (ref 14–82)

## 2016-10-17 DIAGNOSIS — M47817 Spondylosis without myelopathy or radiculopathy, lumbosacral region: Secondary | ICD-10-CM | POA: Diagnosis not present

## 2016-10-17 DIAGNOSIS — M5416 Radiculopathy, lumbar region: Secondary | ICD-10-CM | POA: Diagnosis not present

## 2016-10-17 DIAGNOSIS — M533 Sacrococcygeal disorders, not elsewhere classified: Secondary | ICD-10-CM | POA: Diagnosis not present

## 2016-10-17 DIAGNOSIS — M5481 Occipital neuralgia: Secondary | ICD-10-CM | POA: Diagnosis not present

## 2016-10-23 ENCOUNTER — Encounter: Payer: PPO | Admitting: Neurology

## 2016-10-31 ENCOUNTER — Other Ambulatory Visit: Payer: Self-pay | Admitting: Obstetrics and Gynecology

## 2016-10-31 DIAGNOSIS — Z1231 Encounter for screening mammogram for malignant neoplasm of breast: Secondary | ICD-10-CM

## 2016-10-31 DIAGNOSIS — Z01419 Encounter for gynecological examination (general) (routine) without abnormal findings: Secondary | ICD-10-CM | POA: Diagnosis not present

## 2016-11-15 DIAGNOSIS — M5033 Other cervical disc degeneration, cervicothoracic region: Secondary | ICD-10-CM | POA: Diagnosis not present

## 2016-11-15 DIAGNOSIS — M47817 Spondylosis without myelopathy or radiculopathy, lumbosacral region: Secondary | ICD-10-CM | POA: Diagnosis not present

## 2016-11-15 DIAGNOSIS — M5416 Radiculopathy, lumbar region: Secondary | ICD-10-CM | POA: Diagnosis not present

## 2016-11-15 DIAGNOSIS — G894 Chronic pain syndrome: Secondary | ICD-10-CM | POA: Diagnosis not present

## 2016-11-15 DIAGNOSIS — Z79891 Long term (current) use of opiate analgesic: Secondary | ICD-10-CM | POA: Diagnosis not present

## 2016-11-15 DIAGNOSIS — M5137 Other intervertebral disc degeneration, lumbosacral region: Secondary | ICD-10-CM | POA: Diagnosis not present

## 2016-11-15 DIAGNOSIS — M545 Low back pain: Secondary | ICD-10-CM | POA: Diagnosis not present

## 2016-11-15 DIAGNOSIS — M5481 Occipital neuralgia: Secondary | ICD-10-CM | POA: Diagnosis not present

## 2016-11-15 DIAGNOSIS — M5136 Other intervertebral disc degeneration, lumbar region: Secondary | ICD-10-CM | POA: Diagnosis not present

## 2016-11-15 DIAGNOSIS — M533 Sacrococcygeal disorders, not elsewhere classified: Secondary | ICD-10-CM | POA: Diagnosis not present

## 2016-11-15 DIAGNOSIS — M542 Cervicalgia: Secondary | ICD-10-CM | POA: Diagnosis not present

## 2016-11-15 DIAGNOSIS — M5031 Other cervical disc degeneration,  high cervical region: Secondary | ICD-10-CM | POA: Diagnosis not present

## 2016-11-17 DIAGNOSIS — E559 Vitamin D deficiency, unspecified: Secondary | ICD-10-CM | POA: Diagnosis not present

## 2016-11-17 DIAGNOSIS — L659 Nonscarring hair loss, unspecified: Secondary | ICD-10-CM | POA: Diagnosis not present

## 2016-11-21 DIAGNOSIS — E785 Hyperlipidemia, unspecified: Secondary | ICD-10-CM | POA: Diagnosis not present

## 2016-11-21 DIAGNOSIS — G4733 Obstructive sleep apnea (adult) (pediatric): Secondary | ICD-10-CM | POA: Insufficient documentation

## 2016-11-21 DIAGNOSIS — I10 Essential (primary) hypertension: Secondary | ICD-10-CM | POA: Diagnosis not present

## 2016-11-21 DIAGNOSIS — E119 Type 2 diabetes mellitus without complications: Secondary | ICD-10-CM | POA: Insufficient documentation

## 2016-11-21 DIAGNOSIS — R5381 Other malaise: Secondary | ICD-10-CM | POA: Diagnosis not present

## 2016-11-21 DIAGNOSIS — R5383 Other fatigue: Secondary | ICD-10-CM | POA: Diagnosis not present

## 2016-11-22 ENCOUNTER — Ambulatory Visit
Admission: RE | Admit: 2016-11-22 | Discharge: 2016-11-22 | Disposition: A | Discharge status: Home / Self Care | Payer: PPO | Source: Ambulatory Visit | Attending: Obstetrics and Gynecology | Admitting: Obstetrics and Gynecology

## 2016-11-22 DIAGNOSIS — Z1231 Encounter for screening mammogram for malignant neoplasm of breast: Secondary | ICD-10-CM | POA: Insufficient documentation

## 2016-12-19 DIAGNOSIS — M47816 Spondylosis without myelopathy or radiculopathy, lumbar region: Secondary | ICD-10-CM | POA: Diagnosis not present

## 2016-12-19 DIAGNOSIS — M542 Cervicalgia: Secondary | ICD-10-CM | POA: Diagnosis not present

## 2016-12-19 DIAGNOSIS — Z79891 Long term (current) use of opiate analgesic: Secondary | ICD-10-CM | POA: Diagnosis not present

## 2016-12-19 DIAGNOSIS — G894 Chronic pain syndrome: Secondary | ICD-10-CM | POA: Diagnosis not present

## 2016-12-19 DIAGNOSIS — M5033 Other cervical disc degeneration, cervicothoracic region: Secondary | ICD-10-CM | POA: Diagnosis not present

## 2016-12-19 DIAGNOSIS — M47817 Spondylosis without myelopathy or radiculopathy, lumbosacral region: Secondary | ICD-10-CM | POA: Diagnosis not present

## 2016-12-19 DIAGNOSIS — M5416 Radiculopathy, lumbar region: Secondary | ICD-10-CM | POA: Diagnosis not present

## 2016-12-19 DIAGNOSIS — M5031 Other cervical disc degeneration,  high cervical region: Secondary | ICD-10-CM | POA: Diagnosis not present

## 2016-12-19 DIAGNOSIS — M533 Sacrococcygeal disorders, not elsewhere classified: Secondary | ICD-10-CM | POA: Diagnosis not present

## 2016-12-19 DIAGNOSIS — M5481 Occipital neuralgia: Secondary | ICD-10-CM | POA: Diagnosis not present

## 2016-12-19 DIAGNOSIS — M5136 Other intervertebral disc degeneration, lumbar region: Secondary | ICD-10-CM | POA: Diagnosis not present

## 2016-12-19 DIAGNOSIS — M48061 Spinal stenosis, lumbar region without neurogenic claudication: Secondary | ICD-10-CM | POA: Diagnosis not present

## 2016-12-26 DIAGNOSIS — G4733 Obstructive sleep apnea (adult) (pediatric): Secondary | ICD-10-CM | POA: Diagnosis not present

## 2016-12-26 DIAGNOSIS — E119 Type 2 diabetes mellitus without complications: Secondary | ICD-10-CM | POA: Diagnosis not present

## 2016-12-26 DIAGNOSIS — I10 Essential (primary) hypertension: Secondary | ICD-10-CM | POA: Diagnosis not present

## 2017-01-02 DIAGNOSIS — D510 Vitamin B12 deficiency anemia due to intrinsic factor deficiency: Secondary | ICD-10-CM | POA: Diagnosis not present

## 2017-01-02 DIAGNOSIS — E1142 Type 2 diabetes mellitus with diabetic polyneuropathy: Secondary | ICD-10-CM | POA: Diagnosis not present

## 2017-01-02 DIAGNOSIS — E11649 Type 2 diabetes mellitus with hypoglycemia without coma: Secondary | ICD-10-CM | POA: Diagnosis not present

## 2017-01-02 DIAGNOSIS — E559 Vitamin D deficiency, unspecified: Secondary | ICD-10-CM | POA: Diagnosis not present

## 2017-01-16 DIAGNOSIS — M542 Cervicalgia: Secondary | ICD-10-CM | POA: Diagnosis not present

## 2017-01-16 DIAGNOSIS — M5033 Other cervical disc degeneration, cervicothoracic region: Secondary | ICD-10-CM | POA: Diagnosis not present

## 2017-01-16 DIAGNOSIS — M5481 Occipital neuralgia: Secondary | ICD-10-CM | POA: Diagnosis not present

## 2017-01-16 DIAGNOSIS — M5136 Other intervertebral disc degeneration, lumbar region: Secondary | ICD-10-CM | POA: Diagnosis not present

## 2017-01-16 DIAGNOSIS — M47816 Spondylosis without myelopathy or radiculopathy, lumbar region: Secondary | ICD-10-CM | POA: Diagnosis not present

## 2017-01-16 DIAGNOSIS — M5031 Other cervical disc degeneration,  high cervical region: Secondary | ICD-10-CM | POA: Diagnosis not present

## 2017-01-16 DIAGNOSIS — G894 Chronic pain syndrome: Secondary | ICD-10-CM | POA: Diagnosis not present

## 2017-01-16 DIAGNOSIS — M5416 Radiculopathy, lumbar region: Secondary | ICD-10-CM | POA: Diagnosis not present

## 2017-01-16 DIAGNOSIS — M47817 Spondylosis without myelopathy or radiculopathy, lumbosacral region: Secondary | ICD-10-CM | POA: Diagnosis not present

## 2017-01-16 DIAGNOSIS — M533 Sacrococcygeal disorders, not elsewhere classified: Secondary | ICD-10-CM | POA: Diagnosis not present

## 2017-01-16 DIAGNOSIS — M48061 Spinal stenosis, lumbar region without neurogenic claudication: Secondary | ICD-10-CM | POA: Diagnosis not present

## 2017-01-16 DIAGNOSIS — Z79891 Long term (current) use of opiate analgesic: Secondary | ICD-10-CM | POA: Diagnosis not present

## 2017-01-18 DIAGNOSIS — E119 Type 2 diabetes mellitus without complications: Secondary | ICD-10-CM | POA: Diagnosis not present

## 2017-01-18 DIAGNOSIS — Z713 Dietary counseling and surveillance: Secondary | ICD-10-CM | POA: Diagnosis not present

## 2017-01-23 DIAGNOSIS — E119 Type 2 diabetes mellitus without complications: Secondary | ICD-10-CM | POA: Diagnosis not present

## 2017-01-23 DIAGNOSIS — G4733 Obstructive sleep apnea (adult) (pediatric): Secondary | ICD-10-CM | POA: Diagnosis not present

## 2017-01-23 DIAGNOSIS — I10 Essential (primary) hypertension: Secondary | ICD-10-CM | POA: Diagnosis not present

## 2017-01-24 DIAGNOSIS — F4322 Adjustment disorder with anxiety: Secondary | ICD-10-CM | POA: Diagnosis not present

## 2017-01-26 DIAGNOSIS — E119 Type 2 diabetes mellitus without complications: Secondary | ICD-10-CM | POA: Diagnosis not present

## 2017-01-26 DIAGNOSIS — I10 Essential (primary) hypertension: Secondary | ICD-10-CM | POA: Diagnosis not present

## 2017-01-28 DIAGNOSIS — F4322 Adjustment disorder with anxiety: Secondary | ICD-10-CM | POA: Diagnosis not present

## 2017-01-30 DIAGNOSIS — I428 Other cardiomyopathies: Secondary | ICD-10-CM | POA: Diagnosis not present

## 2017-01-30 DIAGNOSIS — I10 Essential (primary) hypertension: Secondary | ICD-10-CM | POA: Diagnosis not present

## 2017-01-30 DIAGNOSIS — J3089 Other allergic rhinitis: Secondary | ICD-10-CM | POA: Diagnosis not present

## 2017-02-05 DIAGNOSIS — G479 Sleep disorder, unspecified: Secondary | ICD-10-CM | POA: Diagnosis not present

## 2017-02-05 DIAGNOSIS — E1140 Type 2 diabetes mellitus with diabetic neuropathy, unspecified: Secondary | ICD-10-CM | POA: Diagnosis not present

## 2017-02-06 DIAGNOSIS — E1140 Type 2 diabetes mellitus with diabetic neuropathy, unspecified: Secondary | ICD-10-CM | POA: Insufficient documentation

## 2017-02-07 DIAGNOSIS — G5603 Carpal tunnel syndrome, bilateral upper limbs: Secondary | ICD-10-CM | POA: Diagnosis not present

## 2017-02-12 DIAGNOSIS — R0602 Shortness of breath: Secondary | ICD-10-CM | POA: Diagnosis not present

## 2017-02-19 DIAGNOSIS — M48061 Spinal stenosis, lumbar region without neurogenic claudication: Secondary | ICD-10-CM | POA: Diagnosis not present

## 2017-02-19 DIAGNOSIS — M5416 Radiculopathy, lumbar region: Secondary | ICD-10-CM | POA: Diagnosis not present

## 2017-02-19 DIAGNOSIS — M5031 Other cervical disc degeneration,  high cervical region: Secondary | ICD-10-CM | POA: Diagnosis not present

## 2017-02-19 DIAGNOSIS — M5136 Other intervertebral disc degeneration, lumbar region: Secondary | ICD-10-CM | POA: Diagnosis not present

## 2017-02-19 DIAGNOSIS — Z79891 Long term (current) use of opiate analgesic: Secondary | ICD-10-CM | POA: Diagnosis not present

## 2017-02-19 DIAGNOSIS — M5033 Other cervical disc degeneration, cervicothoracic region: Secondary | ICD-10-CM | POA: Diagnosis not present

## 2017-02-19 DIAGNOSIS — M47817 Spondylosis without myelopathy or radiculopathy, lumbosacral region: Secondary | ICD-10-CM | POA: Diagnosis not present

## 2017-02-19 DIAGNOSIS — G894 Chronic pain syndrome: Secondary | ICD-10-CM | POA: Diagnosis not present

## 2017-02-19 DIAGNOSIS — M533 Sacrococcygeal disorders, not elsewhere classified: Secondary | ICD-10-CM | POA: Diagnosis not present

## 2017-02-19 DIAGNOSIS — M542 Cervicalgia: Secondary | ICD-10-CM | POA: Diagnosis not present

## 2017-02-19 DIAGNOSIS — M5481 Occipital neuralgia: Secondary | ICD-10-CM | POA: Diagnosis not present

## 2017-02-19 DIAGNOSIS — M47816 Spondylosis without myelopathy or radiculopathy, lumbar region: Secondary | ICD-10-CM | POA: Diagnosis not present

## 2017-03-06 DIAGNOSIS — M25571 Pain in right ankle and joints of right foot: Secondary | ICD-10-CM | POA: Diagnosis not present

## 2017-03-06 DIAGNOSIS — M7989 Other specified soft tissue disorders: Secondary | ICD-10-CM | POA: Diagnosis not present

## 2017-03-08 DIAGNOSIS — Z01818 Encounter for other preprocedural examination: Secondary | ICD-10-CM | POA: Diagnosis not present

## 2017-03-08 DIAGNOSIS — R0602 Shortness of breath: Secondary | ICD-10-CM | POA: Diagnosis not present

## 2017-03-08 DIAGNOSIS — M545 Low back pain: Secondary | ICD-10-CM | POA: Diagnosis not present

## 2017-03-08 DIAGNOSIS — I428 Other cardiomyopathies: Secondary | ICD-10-CM | POA: Diagnosis not present

## 2017-03-08 DIAGNOSIS — E669 Obesity, unspecified: Secondary | ICD-10-CM | POA: Diagnosis not present

## 2017-03-08 DIAGNOSIS — I2510 Atherosclerotic heart disease of native coronary artery without angina pectoris: Secondary | ICD-10-CM | POA: Diagnosis not present

## 2017-03-08 DIAGNOSIS — I10 Essential (primary) hypertension: Secondary | ICD-10-CM | POA: Diagnosis not present

## 2017-03-08 DIAGNOSIS — E119 Type 2 diabetes mellitus without complications: Secondary | ICD-10-CM | POA: Diagnosis not present

## 2017-03-08 DIAGNOSIS — E784 Other hyperlipidemia: Secondary | ICD-10-CM | POA: Diagnosis not present

## 2017-03-08 DIAGNOSIS — I509 Heart failure, unspecified: Secondary | ICD-10-CM | POA: Diagnosis not present

## 2017-03-08 DIAGNOSIS — F329 Major depressive disorder, single episode, unspecified: Secondary | ICD-10-CM | POA: Diagnosis not present

## 2017-03-08 DIAGNOSIS — R600 Localized edema: Secondary | ICD-10-CM | POA: Diagnosis not present

## 2017-03-12 DIAGNOSIS — R0982 Postnasal drip: Secondary | ICD-10-CM | POA: Diagnosis not present

## 2017-03-12 DIAGNOSIS — E7800 Pure hypercholesterolemia, unspecified: Secondary | ICD-10-CM | POA: Diagnosis not present

## 2017-03-12 DIAGNOSIS — E669 Obesity, unspecified: Secondary | ICD-10-CM | POA: Diagnosis not present

## 2017-03-12 DIAGNOSIS — I10 Essential (primary) hypertension: Secondary | ICD-10-CM | POA: Diagnosis not present

## 2017-03-19 DIAGNOSIS — Z79899 Other long term (current) drug therapy: Secondary | ICD-10-CM | POA: Diagnosis not present

## 2017-03-19 DIAGNOSIS — Z8614 Personal history of Methicillin resistant Staphylococcus aureus infection: Secondary | ICD-10-CM | POA: Diagnosis not present

## 2017-03-19 DIAGNOSIS — Z9049 Acquired absence of other specified parts of digestive tract: Secondary | ICD-10-CM | POA: Diagnosis not present

## 2017-03-19 DIAGNOSIS — I10 Essential (primary) hypertension: Secondary | ICD-10-CM | POA: Diagnosis not present

## 2017-03-19 DIAGNOSIS — E119 Type 2 diabetes mellitus without complications: Secondary | ICD-10-CM | POA: Diagnosis not present

## 2017-03-19 DIAGNOSIS — I110 Hypertensive heart disease with heart failure: Secondary | ICD-10-CM | POA: Diagnosis not present

## 2017-03-19 DIAGNOSIS — Z9889 Other specified postprocedural states: Secondary | ICD-10-CM | POA: Diagnosis not present

## 2017-03-19 DIAGNOSIS — I509 Heart failure, unspecified: Secondary | ICD-10-CM | POA: Diagnosis not present

## 2017-03-19 DIAGNOSIS — E1122 Type 2 diabetes mellitus with diabetic chronic kidney disease: Secondary | ICD-10-CM | POA: Diagnosis not present

## 2017-03-19 DIAGNOSIS — K2970 Gastritis, unspecified, without bleeding: Secondary | ICD-10-CM | POA: Diagnosis not present

## 2017-03-19 DIAGNOSIS — Z794 Long term (current) use of insulin: Secondary | ICD-10-CM | POA: Diagnosis not present

## 2017-03-19 DIAGNOSIS — K2950 Unspecified chronic gastritis without bleeding: Secondary | ICD-10-CM | POA: Diagnosis not present

## 2017-03-19 DIAGNOSIS — Z90710 Acquired absence of both cervix and uterus: Secondary | ICD-10-CM | POA: Diagnosis not present

## 2017-03-20 DIAGNOSIS — M48061 Spinal stenosis, lumbar region without neurogenic claudication: Secondary | ICD-10-CM | POA: Diagnosis not present

## 2017-03-20 DIAGNOSIS — M5481 Occipital neuralgia: Secondary | ICD-10-CM | POA: Diagnosis not present

## 2017-03-20 DIAGNOSIS — M5033 Other cervical disc degeneration, cervicothoracic region: Secondary | ICD-10-CM | POA: Diagnosis not present

## 2017-03-20 DIAGNOSIS — M533 Sacrococcygeal disorders, not elsewhere classified: Secondary | ICD-10-CM | POA: Diagnosis not present

## 2017-03-20 DIAGNOSIS — M542 Cervicalgia: Secondary | ICD-10-CM | POA: Diagnosis not present

## 2017-03-20 DIAGNOSIS — M5031 Other cervical disc degeneration,  high cervical region: Secondary | ICD-10-CM | POA: Diagnosis not present

## 2017-03-20 DIAGNOSIS — M47816 Spondylosis without myelopathy or radiculopathy, lumbar region: Secondary | ICD-10-CM | POA: Diagnosis not present

## 2017-03-20 DIAGNOSIS — M5416 Radiculopathy, lumbar region: Secondary | ICD-10-CM | POA: Diagnosis not present

## 2017-03-20 DIAGNOSIS — G894 Chronic pain syndrome: Secondary | ICD-10-CM | POA: Diagnosis not present

## 2017-03-20 DIAGNOSIS — M47817 Spondylosis without myelopathy or radiculopathy, lumbosacral region: Secondary | ICD-10-CM | POA: Diagnosis not present

## 2017-03-20 DIAGNOSIS — Z79891 Long term (current) use of opiate analgesic: Secondary | ICD-10-CM | POA: Diagnosis not present

## 2017-03-20 DIAGNOSIS — M5136 Other intervertebral disc degeneration, lumbar region: Secondary | ICD-10-CM | POA: Diagnosis not present

## 2017-03-21 DIAGNOSIS — M84371A Stress fracture, right ankle, initial encounter for fracture: Secondary | ICD-10-CM | POA: Diagnosis not present

## 2017-04-03 DIAGNOSIS — E784 Other hyperlipidemia: Secondary | ICD-10-CM | POA: Diagnosis not present

## 2017-04-03 DIAGNOSIS — I10 Essential (primary) hypertension: Secondary | ICD-10-CM | POA: Diagnosis not present

## 2017-04-03 DIAGNOSIS — E119 Type 2 diabetes mellitus without complications: Secondary | ICD-10-CM | POA: Diagnosis not present

## 2017-04-07 ENCOUNTER — Emergency Department: Payer: PPO

## 2017-04-07 ENCOUNTER — Emergency Department
Admission: EM | Admit: 2017-04-07 | Discharge: 2017-04-07 | Disposition: A | Discharge status: Home / Self Care | Payer: PPO | Attending: Emergency Medicine | Admitting: Emergency Medicine

## 2017-04-07 DIAGNOSIS — I509 Heart failure, unspecified: Secondary | ICD-10-CM | POA: Insufficient documentation

## 2017-04-07 DIAGNOSIS — I2510 Atherosclerotic heart disease of native coronary artery without angina pectoris: Secondary | ICD-10-CM | POA: Insufficient documentation

## 2017-04-07 DIAGNOSIS — E1129 Type 2 diabetes mellitus with other diabetic kidney complication: Secondary | ICD-10-CM | POA: Diagnosis not present

## 2017-04-07 DIAGNOSIS — Z79899 Other long term (current) drug therapy: Secondary | ICD-10-CM | POA: Insufficient documentation

## 2017-04-07 DIAGNOSIS — M19011 Primary osteoarthritis, right shoulder: Secondary | ICD-10-CM | POA: Insufficient documentation

## 2017-04-07 DIAGNOSIS — I110 Hypertensive heart disease with heart failure: Secondary | ICD-10-CM | POA: Diagnosis not present

## 2017-04-07 DIAGNOSIS — M25511 Pain in right shoulder: Secondary | ICD-10-CM | POA: Diagnosis not present

## 2017-04-07 MED ORDER — KETOROLAC TROMETHAMINE 60 MG/2ML IM SOLN
30.0000 mg | Freq: Once | INTRAMUSCULAR | Status: AC
  Administered 2017-04-07: 30 mg via INTRAMUSCULAR
  Filled 2017-04-07: qty 2

## 2017-04-07 MED ORDER — OXYCODONE HCL 5 MG PO TABS
10.0000 mg | ORAL_TABLET | Freq: Once | ORAL | Status: AC
  Administered 2017-04-07: 10 mg via ORAL
  Filled 2017-04-07: qty 2

## 2017-04-07 MED ORDER — MELOXICAM 15 MG PO TABS: 15.0000 mg | ORAL_TABLET | Freq: Every day | ORAL | 0 refills | Status: DC

## 2017-04-07 NOTE — ED Notes (Signed)
Patient transported to X-ray 

## 2017-04-07 NOTE — ED Provider Notes (Signed)
[facility] Emergency Department Provider Note ____________________________________________  Time seen: Approximately 10:26 PM  I have reviewed the triage vital signs and the nursing notes.   HISTORY  Chief Complaint Shoulder Pain    HPI Marissa [name] is a 41 y.o. female who presents to the emergency department for evaluation and treatment of nontraumatic right shoulder pain. She has a long-standing history of chronic shoulder pain for which she has had surgery last year. She states that he "cleaned up some arthritis." She states that the pain in the shoulder startedincreasing today and now is unable to raise the right arm or put her arm behind her. She currently takes oxycodone for chronic pain, gabapentin, Zanaflex 3 times per day, and has had no relief.   Past Medical History:  Diagnosis Date  . Allergy   . Anemia   . Anxiety   . Arthritis   . Back pain, chronic   . Cardiomyopathy, dilated, nonischemic ([place])   . CHF (congestive heart failure) ([place])   . Cholelithiasis   . Coronary artery disease   . Depression   . Diabetes mellitus without complication (HCC)    fasting cbg 50-140s  . Dysrhythmia    tachycardia  . GERD (gastroesophageal reflux disease)    otc meds  . Headache(784.0)   . Hypercholesteremia   . Hyperlipemia   . Hypertension   . Insulin pump in place    pt had insulin pump but it is now removed (02-18-16)  . MI, old    2006  . Migraines    once/month maybe - can last up to two weeks  . Myocardial infarction [facility]) 2006   "due to medication"; no evidence of ischemia or infarction by nuclear stress test '11  . Neck pain, chronic   . Neuropathy   . Pneumonia 2015   [facility]  . Prolonged QT interval syndrome   . Renal insufficiency   . Restless leg syndrome, controlled   . Sepsis ([place]) 2016   secondary to surgery  . Sleep apnea    does not use cpap since losing alot of weight  . Vision loss    due to diabetes    Patient  Active Problem List   Diagnosis Date Noted  . Paresthesia 09/06/2016  . Cardiomyopathy ([place]) 04/23/2015  . Long term current use of antibiotics 04/23/2015  . Biliary colic 22/48/2500  . Calculus of gallbladder with acute cholecystitis 04/07/2015  . Bilateral occipital neuralgia 01/21/2015  . Dizziness 01/04/2015  . Headache 01/04/2015  . DDD (degenerative disc disease), cervical 12/18/2014  . DDD (degenerative disc disease), thoracic 12/18/2014  . DDD (degenerative disc disease), lumbosacral 12/18/2014  . Neuropathy due to secondary diabetes ([place]) 12/18/2014  . Migraine 12/18/2014  . Hypercholesterolemia without hypertriglyceridemia 08/28/2014  . HPV test positive 07/17/2014  . Class 2 obesity 04/15/2014  . CAD in native artery 04/15/2014  . Breathlessness on exertion 03/24/2014  . Wound infection complicating hardware ([place]) 02/25/2014  . Nausea alone 02/01/2014  . Abdominal pain, unspecified site 02/01/2014  . Severe sepsis(995.92) 01/30/2014  . History of lumbar laminectomy 01/30/2014  . Acute renal failure ([place]) 01/30/2014  . Anemia 01/30/2014  . HTN (hypertension) 01/30/2014  . DM (diabetes mellitus), type 2 with renal complications ([place]) 37/11/8887  . Wound infection 01/25/2014  . Acute respiratory failure with hypoxia ([place]) 01/25/2014  . Type 2 diabetes mellitus treated with insulin ([place]) 01/13/2014  . Essential (primary) hypertension 01/13/2014  . Difficulty speaking 01/13/2014  . Clinical depression 01/13/2014  .  Back pain 11/01/2013  . HNP (herniated nucleus pulposus), lumbar 10/29/2013    Past Surgical History:  Procedure Laterality Date  . BACK SURGERY  2011   Lumbar   . CARDIAC CATHETERIZATION  07/18/2004   50-60% mid LAD, minor luminal irregularities RCA, normal LM and CX, EF 50-55% [facility])  . carpel tunnel Bilateral   . CERVICAL FUSION  2006  . CHOLECYSTECTOMY N/A 04/08/2015   Procedure: LAPAROSCOPIC CHOLECYSTECTOMY WITH INTRAOPERATIVE  CHOLANGIOGRAM;  Surgeon: Florene Glen, MD;  Location: [facility] ORS;  Service: General;  Laterality: N/A;  . LUMBAR LAMINECTOMY/DECOMPRESSION MICRODISCECTOMY Right 05/07/2013   Procedure: Right Lumbar one-two laminectomy;  Surgeon: Elaina Hoops, MD;  Location: [place] NEURO ORS;  Service: Neurosurgery;  Laterality: Right;  . LUMBAR LAMINECTOMY/DECOMPRESSION MICRODISCECTOMY Left 10/29/2013   Procedure: Left Lumbar five-Sacral one Laminectomy;  Surgeon: Elaina Hoops, MD;  Location: [place] NEURO ORS;  Service: Neurosurgery;  Laterality: Left;  . LUMBAR WOUND DEBRIDEMENT N/A 01/25/2014   Procedure: LUMBAR WOUND DEBRIDEMENT;  Surgeon: Charlie Pitter, MD;  Location: [place] NEURO ORS;  Service: Neurosurgery;  Laterality: N/A;  . LUMBAR WOUND DEBRIDEMENT N/A 02/25/2014   Procedure: LUMBAR WOUND DEBRIDEMENT;  Surgeon: Elaina Hoops, MD;  Location: [place] NEURO ORS;  Service: Neurosurgery;  Laterality: N/A;  . SALPINGOOPHORECTOMY Bilateral    1 1997. 2nd 2001  . SHOULDER ARTHROSCOPY WITH BICEPSTENOTOMY Left 03/24/2016   Procedure: shoulder arthroscopy with biceps TENOTOMY, removal loose body, limited synovectomy;  Surgeon: Leanor Kail, MD;  Location: [facility] ORS;  Service: Orthopedics;  Laterality: Left;  . shoulder sugery     7/17 rotator cuff    Prior to Admission medications   Medication Sig Start Date End Date Taking? Authorizing Provider  amitriptyline (ELAVIL) 100 MG tablet Take 100 mg by mouth at bedtime.     [provider]  aspirin-acetaminophen-caffeine (EXCEDRIN MIGRAINE) [phone] MG tablet Take 1 tablet by mouth every 6 (six) hours as needed for headache.    [provider]  carvedilol (COREG) 25 MG tablet Take 1 tablet (25 mg total) by mouth 2 (two) times daily with a meal. 02/04/14   Buriev, Arie Sabina, MD  cloNIDine (CATAPRES) 0.2 MG tablet Take 0.2 mg by mouth 2 (two) times daily. 02/10/16   [provider]  Cyanocobalamin (B-12 IJ) Inject 1,000 mcg as directed every 30 (thirty) days.     [provider]  gabapentin (NEURONTIN) 300 MG capsule Take 600 mg by mouth 3 (three) times daily.     [provider]  insulin aspart (NOVOLOG FLEXPEN) 100 UNIT/ML FlexPen Inject 10-20 Units into the skin 3 (three) times daily with meals. Sliding scale 02/04/14   Kinnie Feil, MD  lisinopril (PRINIVIL,ZESTRIL) 10 MG tablet Take 10 mg by mouth at bedtime. Reported on 10/19/2015    [provider]  meloxicam (MOBIC) 15 MG tablet Take 1 tablet (15 mg total) by mouth daily. 04/07/17   Zuley Lutter, Johnette Abraham B, FNP  nitroGLYCERIN (NITROSTAT) 0.4 MG SL tablet Place 0.4 mg under the tongue every 5 (five) minutes as needed for chest pain.    [provider]  oxyCODONE (ROXICODONE) 15 MG immediate release tablet Limit 1/2-1 tab by mouth 4-7 times per day if tolerated 04/19/16   Mohammed Kindle, MD  rOPINIRole (REQUIP) 0.25 MG tablet Take 0.25 mg by mouth 2 (two) times daily.    [provider]  spironolactone (ALDACTONE) 25 MG tablet Take 25 mg by mouth daily. Reported on 09/21/2015 03/24/15   [provider]  tiZANidine (ZANAFLEX) 4 MG tablet Limit 1 tab by mouth per day or 2-3 times per day if tolerated 04/19/16   Mohammed Kindle, MD  topiramate (TOPAMAX) 100 MG tablet Take 100 mg by mouth at bedtime. Take with 25 mg tablet for a 125 mg dose    [provider]  topiramate (TOPAMAX) 25 MG tablet Take 25 mg by mouth at bedtime.    [provider]  TRULICITY 1.5 AO/1.3YQ SOPN INJECT 1.5 MG SUBCUTANEOUSLY EVERY 7 (SEVEN) DAYS. 08/20/16   [provider]    Allergies Bactrim [sulfamethoxazole-trimethoprim]; Ciprofloxacin; Rocephin [ceftriaxone sodium in dextrose]; Vancomycin; Amoxicillin; Ceftriaxone; Azithromycin; and Food  Family History  Problem Relation Age of Onset  . Depression Mother   . Drug abuse Mother   . Early death Mother   . Hypertension Mother   . Varicose Veins Mother   . Diabetes Father   . Early death Father   .  Hyperlipidemia Father   . Heart Problems Father        enlarged geart  . Breast cancer Maternal Grandmother        60's  . Breast cancer Maternal Aunt     Social History Social History  Substance Use Topics  . Smoking status: Never Smoker  . Smokeless tobacco: Never Used  . Alcohol use No    Review of Systems Constitutional: Negative for recent illness. Cardiovascular: Negative for chest pain Respiratory: Negative for shortness of breath Musculoskeletal: Positive for right shoulder pain Skin: Negative for rash, lesion, or wound.  Neurological: Negative for paresthesias of the right extremity.  ____________________________________________   PHYSICAL EXAM:  VITAL SIGNS: ED Triage Vitals  Enc Vitals Group     BP 04/07/17 2127 (!) 144/87     Pulse Rate 04/07/17 2124 100     Resp 04/07/17 2124 16     Temp 04/07/17 2124 98.8 F (37.1 C)     Temp Source 04/07/17 2124 Oral     SpO2 04/07/17 2124 100 %     Weight 04/07/17 2125 230 lb (104.3 kg)     Height 04/07/17 2125 5\' 4"  (1.626 m)     Head Circumference --      Peak Flow --      Pain Score 04/07/17 2124 8     Pain Loc --      Pain Edu? --      Excl. in [place]? --     Constitutional: Alert and oriented. Well appearing and in no acute distress. Eyes: Conjunctivae are clear without discharge or drainage.  Head: Atraumatic Neck: Unrestricted range of motion observed Respiratory: Respirations are even and unlabored Musculoskeletal: Range of motion is extremely limited due to pain. Patient unwilling to attempt Neurologic: Sharp and dull sensation intact  Skin: Exposed skin surfaces are without rash, lesion, or wound.  Psychiatric: Affect and behavior are appropriate  ____________________________________________   LABS (all labs ordered are listed, but only abnormal results are displayed)  Labs Reviewed - No data to display ____________________________________________  RADIOLOGY  Right shoulder demonstrates  chronic degenerative changes without acute bony abnormality per radiology. ____________________________________________   PROCEDURES  Procedure(s) performed: None  ____________________________________________   INITIAL IMPRESSION / ASSESSMENT AND PLAN / ED COURSE  Marissa [name] is a 41 y.o. female who presents to the emergency department for evaluation and treatment of acute on chronic right shoulder pain. She denies new injury. X-ray shows no acute bony abnormality. She will be discharged home with a prescription for meloxicam and  advised to follow-up with her orthopedist. She was instructed to return to the emergency department for symptoms that change or worsen if she is unable schedule an appointment.  Pertinent labs & imaging results that were available during my care of the patient were reviewed by me and considered in my medical decision making (see chart for details).  _________________________________________   FINAL CLINICAL IMPRESSION(S) / ED DIAGNOSES  Final diagnoses:  Osteoarthritis of right shoulder, unspecified osteoarthritis type    Discharge Medication List as of 04/07/2017 11:05 PM    START taking these medications   Details  meloxicam (MOBIC) 15 MG tablet Take 1 tablet (15 mg total) by mouth daily., Starting Sat 04/07/2017, Print        If controlled substance prescribed during this visit, 12 month history viewed on the [place] prior to issuing an initial prescription for Schedule II or III opiod.    Victorino Dike, FNP 04/07/17 2315    Schuyler Amor, MD 04/07/17 2348

## 2017-04-07 NOTE — ED Notes (Signed)
Pt returning from xray  

## 2017-04-07 NOTE — ED Triage Notes (Signed)
Pt states history of arthritis in right shoulder. Pt states this afternoon she began to experience right shoulder pain and the inability to abduct or adduct right shoulder, cms intact to right fingers, 3+ right radial pulse. Pt denies other symptoms.

## 2017-04-07 NOTE — ED Notes (Addendum)
Pt stating that she is coming in for right shoulder pain. Pt stating that she had surgery on her shoulder a year ago and has been fine. Pt stating that today for some reason she has limited range of motion because of pain. Pt stating "a lot of popping." Pt stating numbness/tingling also but stating hx of carpal tunnel. Pt stating that any movement causes pain. Pt stating that pain started early today.

## 2017-04-11 DIAGNOSIS — M25571 Pain in right ankle and joints of right foot: Secondary | ICD-10-CM | POA: Diagnosis not present

## 2017-04-11 DIAGNOSIS — G8929 Other chronic pain: Secondary | ICD-10-CM | POA: Diagnosis not present

## 2017-04-11 DIAGNOSIS — M84371D Stress fracture, right ankle, subsequent encounter for fracture with routine healing: Secondary | ICD-10-CM | POA: Diagnosis not present

## 2017-04-11 DIAGNOSIS — G479 Sleep disorder, unspecified: Secondary | ICD-10-CM | POA: Diagnosis not present

## 2017-04-11 DIAGNOSIS — G5603 Carpal tunnel syndrome, bilateral upper limbs: Secondary | ICD-10-CM | POA: Diagnosis not present

## 2017-04-11 DIAGNOSIS — R252 Cramp and spasm: Secondary | ICD-10-CM | POA: Diagnosis not present

## 2017-04-12 DIAGNOSIS — G5603 Carpal tunnel syndrome, bilateral upper limbs: Secondary | ICD-10-CM | POA: Diagnosis not present

## 2017-04-13 DIAGNOSIS — E119 Type 2 diabetes mellitus without complications: Secondary | ICD-10-CM | POA: Diagnosis not present

## 2017-04-18 DIAGNOSIS — M47817 Spondylosis without myelopathy or radiculopathy, lumbosacral region: Secondary | ICD-10-CM | POA: Diagnosis not present

## 2017-04-18 DIAGNOSIS — M533 Sacrococcygeal disorders, not elsewhere classified: Secondary | ICD-10-CM | POA: Diagnosis not present

## 2017-04-18 DIAGNOSIS — M5481 Occipital neuralgia: Secondary | ICD-10-CM | POA: Diagnosis not present

## 2017-04-18 DIAGNOSIS — M5416 Radiculopathy, lumbar region: Secondary | ICD-10-CM | POA: Diagnosis not present

## 2017-05-01 DIAGNOSIS — E119 Type 2 diabetes mellitus without complications: Secondary | ICD-10-CM | POA: Diagnosis not present

## 2017-05-01 DIAGNOSIS — Z794 Long term (current) use of insulin: Secondary | ICD-10-CM | POA: Diagnosis not present

## 2017-05-02 DIAGNOSIS — M84371D Stress fracture, right ankle, subsequent encounter for fracture with routine healing: Secondary | ICD-10-CM | POA: Diagnosis not present

## 2017-05-03 DIAGNOSIS — E119 Type 2 diabetes mellitus without complications: Secondary | ICD-10-CM | POA: Diagnosis not present

## 2017-05-03 DIAGNOSIS — Z794 Long term (current) use of insulin: Secondary | ICD-10-CM | POA: Diagnosis not present

## 2017-05-03 DIAGNOSIS — E669 Obesity, unspecified: Secondary | ICD-10-CM | POA: Diagnosis not present

## 2017-05-03 DIAGNOSIS — M25511 Pain in right shoulder: Secondary | ICD-10-CM | POA: Diagnosis not present

## 2017-05-14 DIAGNOSIS — M5481 Occipital neuralgia: Secondary | ICD-10-CM | POA: Diagnosis not present

## 2017-05-14 DIAGNOSIS — M542 Cervicalgia: Secondary | ICD-10-CM | POA: Diagnosis not present

## 2017-05-14 DIAGNOSIS — M47817 Spondylosis without myelopathy or radiculopathy, lumbosacral region: Secondary | ICD-10-CM | POA: Diagnosis not present

## 2017-05-14 DIAGNOSIS — Z5181 Encounter for therapeutic drug level monitoring: Secondary | ICD-10-CM | POA: Diagnosis not present

## 2017-05-14 DIAGNOSIS — M545 Low back pain: Secondary | ICD-10-CM | POA: Diagnosis not present

## 2017-05-14 DIAGNOSIS — G894 Chronic pain syndrome: Secondary | ICD-10-CM | POA: Diagnosis not present

## 2017-05-14 DIAGNOSIS — M5416 Radiculopathy, lumbar region: Secondary | ICD-10-CM | POA: Diagnosis not present

## 2017-05-23 DIAGNOSIS — M79605 Pain in left leg: Secondary | ICD-10-CM | POA: Diagnosis not present

## 2017-05-23 DIAGNOSIS — G2581 Restless legs syndrome: Secondary | ICD-10-CM | POA: Diagnosis not present

## 2017-05-23 DIAGNOSIS — I252 Old myocardial infarction: Secondary | ICD-10-CM | POA: Diagnosis not present

## 2017-05-23 DIAGNOSIS — R252 Cramp and spasm: Secondary | ICD-10-CM | POA: Diagnosis not present

## 2017-05-23 DIAGNOSIS — I509 Heart failure, unspecified: Secondary | ICD-10-CM | POA: Insufficient documentation

## 2017-05-23 DIAGNOSIS — E109 Type 1 diabetes mellitus without complications: Secondary | ICD-10-CM | POA: Diagnosis not present

## 2017-05-23 DIAGNOSIS — I10 Essential (primary) hypertension: Secondary | ICD-10-CM | POA: Diagnosis not present

## 2017-05-23 DIAGNOSIS — G8929 Other chronic pain: Secondary | ICD-10-CM | POA: Diagnosis not present

## 2017-05-23 DIAGNOSIS — M79604 Pain in right leg: Secondary | ICD-10-CM | POA: Diagnosis not present

## 2017-05-23 DIAGNOSIS — G479 Sleep disorder, unspecified: Secondary | ICD-10-CM | POA: Diagnosis not present

## 2017-05-25 DIAGNOSIS — Z01818 Encounter for other preprocedural examination: Secondary | ICD-10-CM | POA: Diagnosis not present

## 2017-05-31 DIAGNOSIS — E559 Vitamin D deficiency, unspecified: Secondary | ICD-10-CM | POA: Diagnosis not present

## 2017-05-31 DIAGNOSIS — M1990 Unspecified osteoarthritis, unspecified site: Secondary | ICD-10-CM | POA: Diagnosis not present

## 2017-05-31 DIAGNOSIS — I509 Heart failure, unspecified: Secondary | ICD-10-CM | POA: Diagnosis not present

## 2017-05-31 DIAGNOSIS — E039 Hypothyroidism, unspecified: Secondary | ICD-10-CM | POA: Diagnosis not present

## 2017-05-31 DIAGNOSIS — E538 Deficiency of other specified B group vitamins: Secondary | ICD-10-CM | POA: Diagnosis not present

## 2017-05-31 DIAGNOSIS — Z6837 Body mass index (BMI) 37.0-37.9, adult: Secondary | ICD-10-CM | POA: Diagnosis not present

## 2017-05-31 DIAGNOSIS — Z794 Long term (current) use of insulin: Secondary | ICD-10-CM | POA: Diagnosis not present

## 2017-05-31 DIAGNOSIS — E109 Type 1 diabetes mellitus without complications: Secondary | ICD-10-CM | POA: Diagnosis not present

## 2017-05-31 DIAGNOSIS — Z6836 Body mass index (BMI) 36.0-36.9, adult: Secondary | ICD-10-CM | POA: Diagnosis not present

## 2017-05-31 DIAGNOSIS — I10 Essential (primary) hypertension: Secondary | ICD-10-CM | POA: Diagnosis not present

## 2017-05-31 DIAGNOSIS — Z90710 Acquired absence of both cervix and uterus: Secondary | ICD-10-CM | POA: Diagnosis not present

## 2017-05-31 DIAGNOSIS — G8929 Other chronic pain: Secondary | ICD-10-CM | POA: Diagnosis not present

## 2017-05-31 DIAGNOSIS — I428 Other cardiomyopathies: Secondary | ICD-10-CM | POA: Diagnosis not present

## 2017-05-31 DIAGNOSIS — I110 Hypertensive heart disease with heart failure: Secondary | ICD-10-CM | POA: Diagnosis not present

## 2017-05-31 DIAGNOSIS — I252 Old myocardial infarction: Secondary | ICD-10-CM | POA: Diagnosis not present

## 2017-05-31 DIAGNOSIS — E119 Type 2 diabetes mellitus without complications: Secondary | ICD-10-CM | POA: Diagnosis not present

## 2017-05-31 DIAGNOSIS — Z888 Allergy status to other drugs, medicaments and biological substances status: Secondary | ICD-10-CM | POA: Diagnosis not present

## 2017-05-31 DIAGNOSIS — Z79899 Other long term (current) drug therapy: Secondary | ICD-10-CM | POA: Diagnosis not present

## 2017-05-31 HISTORY — PX: LAPAROSCOPIC GASTRECTOMY: SHX5894

## 2017-06-05 ENCOUNTER — Other Ambulatory Visit: Payer: Self-pay | Admitting: *Deleted

## 2017-06-05 NOTE — Patient Outreach (Signed)
[place] [facility]) Care Management  06/05/2017  Marissa [name] [birth year] [id number]  Referral from [place]; patient discharged from inpatient admission from [facility] 06/01/2017:  Telephone call attempt x 1; left HIPPA compliant voice mail requesting return.  Plan: Will follow up.  Sherrin Daisy, RN BSN [place] Management Coordinator [facility] Care Management  [phone]

## 2017-06-06 ENCOUNTER — Other Ambulatory Visit: Payer: Self-pay | Admitting: *Deleted

## 2017-06-06 NOTE — Patient Outreach (Signed)
[place] [facility]) Care Management  06/06/2017  Marissa [name] Mar 18, 1976 [id number]  Referral from [place]; patient discharged from inpatient admission from [facility] 06/01/2017:  Per Hx: Dx- Morbid obesity; had elective Gastrectomy laparoscopic sleeve 05/31/2017.  Received return call patient who was advised of reason for call & [facility] care management services.  Patient voices that she is doing well since hospital discharge. Voices that she received hospital discharge instruction & is following instructions as it relates to activity, diet, medications & follow up MD appointments.  States she has transportation to appointments & knows how to get in contact with MD if problems arise. Voices that she has all of prescriptions & is taking medications as directed by her MD.  Patient states she does not have any health care concerns currently & does not need [facility] care management services at this time. States she has [facility] contact information and will call if needed.    Plan: Send to care management assistant to close case.   Sherrin Daisy, RN BSN [place] Management Coordinator [facility] Care Management  [phone]

## 2017-06-14 DIAGNOSIS — I130 Hypertensive heart and chronic kidney disease with heart failure and stage 1 through stage 4 chronic kidney disease, or unspecified chronic kidney disease: Secondary | ICD-10-CM | POA: Diagnosis not present

## 2017-06-14 DIAGNOSIS — Z79899 Other long term (current) drug therapy: Secondary | ICD-10-CM | POA: Diagnosis not present

## 2017-06-14 DIAGNOSIS — R0602 Shortness of breath: Secondary | ICD-10-CM | POA: Diagnosis not present

## 2017-06-14 DIAGNOSIS — Z9049 Acquired absence of other specified parts of digestive tract: Secondary | ICD-10-CM | POA: Diagnosis not present

## 2017-06-14 DIAGNOSIS — E1140 Type 2 diabetes mellitus with diabetic neuropathy, unspecified: Secondary | ICD-10-CM | POA: Diagnosis not present

## 2017-06-14 DIAGNOSIS — I252 Old myocardial infarction: Secondary | ICD-10-CM | POA: Diagnosis not present

## 2017-06-14 DIAGNOSIS — R079 Chest pain, unspecified: Secondary | ICD-10-CM | POA: Diagnosis not present

## 2017-06-14 DIAGNOSIS — R0600 Dyspnea, unspecified: Secondary | ICD-10-CM | POA: Diagnosis not present

## 2017-06-14 DIAGNOSIS — N189 Chronic kidney disease, unspecified: Secondary | ICD-10-CM | POA: Diagnosis not present

## 2017-06-14 DIAGNOSIS — R112 Nausea with vomiting, unspecified: Secondary | ICD-10-CM | POA: Diagnosis not present

## 2017-06-14 DIAGNOSIS — I509 Heart failure, unspecified: Secondary | ICD-10-CM | POA: Diagnosis not present

## 2017-06-14 DIAGNOSIS — Z9884 Bariatric surgery status: Secondary | ICD-10-CM | POA: Diagnosis not present

## 2017-06-14 DIAGNOSIS — Z794 Long term (current) use of insulin: Secondary | ICD-10-CM | POA: Diagnosis not present

## 2017-06-14 DIAGNOSIS — E1122 Type 2 diabetes mellitus with diabetic chronic kidney disease: Secondary | ICD-10-CM | POA: Diagnosis not present

## 2017-06-18 DIAGNOSIS — M5481 Occipital neuralgia: Secondary | ICD-10-CM | POA: Diagnosis not present

## 2017-06-18 DIAGNOSIS — M542 Cervicalgia: Secondary | ICD-10-CM | POA: Diagnosis not present

## 2017-06-18 DIAGNOSIS — M545 Low back pain: Secondary | ICD-10-CM | POA: Diagnosis not present

## 2017-06-18 DIAGNOSIS — Z5181 Encounter for therapeutic drug level monitoring: Secondary | ICD-10-CM | POA: Diagnosis not present

## 2017-07-03 DIAGNOSIS — Z72820 Sleep deprivation: Secondary | ICD-10-CM | POA: Diagnosis not present

## 2017-07-03 DIAGNOSIS — D510 Vitamin B12 deficiency anemia due to intrinsic factor deficiency: Secondary | ICD-10-CM | POA: Diagnosis not present

## 2017-07-03 DIAGNOSIS — Z6837 Body mass index (BMI) 37.0-37.9, adult: Secondary | ICD-10-CM | POA: Diagnosis not present

## 2017-07-03 DIAGNOSIS — E1142 Type 2 diabetes mellitus with diabetic polyneuropathy: Secondary | ICD-10-CM | POA: Diagnosis not present

## 2017-07-03 DIAGNOSIS — Z794 Long term (current) use of insulin: Secondary | ICD-10-CM | POA: Diagnosis not present

## 2017-07-03 DIAGNOSIS — E559 Vitamin D deficiency, unspecified: Secondary | ICD-10-CM | POA: Diagnosis not present

## 2017-07-04 ENCOUNTER — Encounter: Payer: Self-pay | Admitting: Emergency Medicine

## 2017-07-04 ENCOUNTER — Emergency Department: Payer: PPO

## 2017-07-04 ENCOUNTER — Emergency Department
Admission: EM | Admit: 2017-07-04 | Discharge: 2017-07-04 | Disposition: A | Discharge status: Home / Self Care | Payer: PPO | Attending: Emergency Medicine | Admitting: Emergency Medicine

## 2017-07-04 DIAGNOSIS — I252 Old myocardial infarction: Secondary | ICD-10-CM | POA: Insufficient documentation

## 2017-07-04 DIAGNOSIS — I110 Hypertensive heart disease with heart failure: Secondary | ICD-10-CM | POA: Insufficient documentation

## 2017-07-04 DIAGNOSIS — E1140 Type 2 diabetes mellitus with diabetic neuropathy, unspecified: Secondary | ICD-10-CM | POA: Insufficient documentation

## 2017-07-04 DIAGNOSIS — Z79899 Other long term (current) drug therapy: Secondary | ICD-10-CM | POA: Insufficient documentation

## 2017-07-04 DIAGNOSIS — Z794 Long term (current) use of insulin: Secondary | ICD-10-CM | POA: Insufficient documentation

## 2017-07-04 DIAGNOSIS — Y999 Unspecified external cause status: Secondary | ICD-10-CM | POA: Insufficient documentation

## 2017-07-04 DIAGNOSIS — M546 Pain in thoracic spine: Secondary | ICD-10-CM | POA: Insufficient documentation

## 2017-07-04 DIAGNOSIS — I509 Heart failure, unspecified: Secondary | ICD-10-CM | POA: Diagnosis not present

## 2017-07-04 DIAGNOSIS — Y939 Activity, unspecified: Secondary | ICD-10-CM | POA: Insufficient documentation

## 2017-07-04 DIAGNOSIS — Y929 Unspecified place or not applicable: Secondary | ICD-10-CM | POA: Diagnosis not present

## 2017-07-04 DIAGNOSIS — I2510 Atherosclerotic heart disease of native coronary artery without angina pectoris: Secondary | ICD-10-CM | POA: Diagnosis not present

## 2017-07-04 DIAGNOSIS — W19XXXA Unspecified fall, initial encounter: Secondary | ICD-10-CM | POA: Diagnosis not present

## 2017-07-04 DIAGNOSIS — M7918 Myalgia, other site: Secondary | ICD-10-CM

## 2017-07-04 DIAGNOSIS — M25512 Pain in left shoulder: Secondary | ICD-10-CM | POA: Insufficient documentation

## 2017-07-04 DIAGNOSIS — S4992XA Unspecified injury of left shoulder and upper arm, initial encounter: Secondary | ICD-10-CM | POA: Diagnosis not present

## 2017-07-04 DIAGNOSIS — E119 Type 2 diabetes mellitus without complications: Secondary | ICD-10-CM | POA: Diagnosis not present

## 2017-07-04 MED ORDER — LIDOCAINE 5 % EX PTCH: 1.0000 | MEDICATED_PATCH | Freq: Two times a day (BID) | CUTANEOUS | 0 refills | Status: AC

## 2017-07-04 MED ORDER — CYCLOBENZAPRINE HCL 5 MG PO TABS: ORAL_TABLET | ORAL | 0 refills | Status: DC

## 2017-07-04 MED ORDER — LIDOCAINE 5 % EX PTCH
1.0000 | MEDICATED_PATCH | CUTANEOUS | Status: DC
  Administered 2017-07-04: 1 via TRANSDERMAL
  Filled 2017-07-04: qty 1

## 2017-07-04 NOTE — ED Triage Notes (Signed)
Pt comes into the ED via POV c/o left shoulder pain after falling in the dirt yesterday. Patient able to move her arm, but states she is having soreness and limited ROM on the left shoulder/  Patient in NAD and was ambulatory to triage at this time.

## 2017-07-04 NOTE — ED Provider Notes (Signed)
[facility] Emergency Department Provider Note  ____________________________________________  Time seen: Approximately 1:03 PM  I have reviewed the triage vital signs and the nursing notes.   HISTORY  Chief Complaint Shoulder Pain    HPI Marissa [name] is a 41 y.o. female that presents to the emergency department for evaluation of left shoulder and upper back pain after tripping in the dirt yesterday.  She states that the terrain was uneven, which caused her to fall.  She felt okay last night but had more pain when she woke up this morning.  Pain is primarily at the back of her shoulder.  She describes the pain as stiff.  She did not hit her head or lose consciousness.  She had a gastric sleeve 1 month ago so she is unable to take NSAIDs.  She takes oxycodone daily for pain.  She denies headache, neck pain, shortness of breath, chest pain, nausea, vomiting, abdominal pain, numbness, tingling.  Past Medical History:  Diagnosis Date  . Allergy   . Anemia   . Anxiety   . Arthritis   . Back pain, chronic   . Cardiomyopathy, dilated, nonischemic ([place])   . CHF (congestive heart failure) ([place])   . Cholelithiasis   . Coronary artery disease   . Depression   . Diabetes mellitus without complication (HCC)    fasting cbg 50-140s  . Dysrhythmia    tachycardia  . GERD (gastroesophageal reflux disease)    otc meds  . Headache(784.0)   . Hypercholesteremia   . Hyperlipemia   . Hypertension   . Insulin pump in place    pt had insulin pump but it is now removed (02-18-16)  . MI, old    2006  . Migraines    once/month maybe - can last up to two weeks  . Myocardial infarction [facility]) 2006   "due to medication"; no evidence of ischemia or infarction by nuclear stress test '11  . Neck pain, chronic   . Neuropathy   . Pneumonia 2015   [facility]  . Prolonged QT interval syndrome   . Renal insufficiency   . Restless leg syndrome, controlled   . Sepsis ([place]) 2016   secondary to surgery  . Sleep apnea    does not use cpap since losing alot of weight  . Vision loss    due to diabetes    Patient Active Problem List   Diagnosis Date Noted  . Paresthesia 09/06/2016  . Cardiomyopathy ([place]) 04/23/2015  . Long term current use of antibiotics 04/23/2015  . Biliary colic 32/35/5732  . Calculus of gallbladder with acute cholecystitis 04/07/2015  . Bilateral occipital neuralgia 01/21/2015  . Dizziness 01/04/2015  . Headache 01/04/2015  . DDD (degenerative disc disease), cervical 12/18/2014  . DDD (degenerative disc disease), thoracic 12/18/2014  . DDD (degenerative disc disease), lumbosacral 12/18/2014  . Neuropathy due to secondary diabetes ([place]) 12/18/2014  . Migraine 12/18/2014  . Hypercholesterolemia without hypertriglyceridemia 08/28/2014  . HPV test positive 07/17/2014  . Class 2 obesity 04/15/2014  . CAD in native artery 04/15/2014  . Breathlessness on exertion 03/24/2014  . Wound infection complicating hardware ([place]) 02/25/2014  . Nausea alone 02/01/2014  . Abdominal pain, unspecified site 02/01/2014  . Severe sepsis(995.92) 01/30/2014  . History of lumbar laminectomy 01/30/2014  . Acute renal failure ([place]) 01/30/2014  . Anemia 01/30/2014  . HTN (hypertension) 01/30/2014  . DM (diabetes mellitus), type 2 with renal complications ([place]) 20/25/4270  . Wound infection 01/25/2014  . Acute  respiratory failure with hypoxia ([place]) 01/25/2014  . Type 2 diabetes mellitus treated with insulin ([place]) 01/13/2014  . Essential (primary) hypertension 01/13/2014  . Difficulty speaking 01/13/2014  . Clinical depression 01/13/2014  . Back pain 11/01/2013  . HNP (herniated nucleus pulposus), lumbar 10/29/2013    Past Surgical History:  Procedure Laterality Date  . BACK SURGERY  2011   Lumbar   . CARDIAC CATHETERIZATION  07/18/2004   50-60% mid LAD, minor luminal irregularities RCA, normal LM and CX, EF 50-55% [facility])  . carpel tunnel  Bilateral   . CERVICAL FUSION  2006  . CHOLECYSTECTOMY N/A 04/08/2015   Procedure: LAPAROSCOPIC CHOLECYSTECTOMY WITH INTRAOPERATIVE CHOLANGIOGRAM;  Surgeon: Florene Glen, MD;  Location: [facility] ORS;  Service: General;  Laterality: N/A;  . LUMBAR LAMINECTOMY/DECOMPRESSION MICRODISCECTOMY Right 05/07/2013   Procedure: Right Lumbar one-two laminectomy;  Surgeon: Elaina Hoops, MD;  Location: [place] NEURO ORS;  Service: Neurosurgery;  Laterality: Right;  . LUMBAR LAMINECTOMY/DECOMPRESSION MICRODISCECTOMY Left 10/29/2013   Procedure: Left Lumbar five-Sacral one Laminectomy;  Surgeon: Elaina Hoops, MD;  Location: [place] NEURO ORS;  Service: Neurosurgery;  Laterality: Left;  . LUMBAR WOUND DEBRIDEMENT N/A 01/25/2014   Procedure: LUMBAR WOUND DEBRIDEMENT;  Surgeon: Charlie Pitter, MD;  Location: [place] NEURO ORS;  Service: Neurosurgery;  Laterality: N/A;  . LUMBAR WOUND DEBRIDEMENT N/A 02/25/2014   Procedure: LUMBAR WOUND DEBRIDEMENT;  Surgeon: Elaina Hoops, MD;  Location: [place] NEURO ORS;  Service: Neurosurgery;  Laterality: N/A;  . SALPINGOOPHORECTOMY Bilateral    1 1997. 2nd 2001  . SHOULDER ARTHROSCOPY WITH BICEPSTENOTOMY Left 03/24/2016   Procedure: shoulder arthroscopy with biceps TENOTOMY, removal loose body, limited synovectomy;  Surgeon: Leanor Kail, MD;  Location: [facility] ORS;  Service: Orthopedics;  Laterality: Left;  . shoulder sugery     7/17 rotator cuff    Prior to Admission medications   Medication Sig Start Date End Date Taking? Authorizing Provider  amitriptyline (ELAVIL) 100 MG tablet Take 100 mg by mouth at bedtime.     [provider]  aspirin-acetaminophen-caffeine (EXCEDRIN MIGRAINE) [phone] MG tablet Take 1 tablet by mouth every 6 (six) hours as needed for headache.    [provider]  carvedilol (COREG) 25 MG tablet Take 1 tablet (25 mg total) by mouth 2 (two) times daily with a meal. 02/04/14   Buriev, Arie Sabina, MD  cloNIDine (CATAPRES) 0.2 MG tablet Take 0.2 mg by mouth 2  (two) times daily. 02/10/16   [provider]  Cyanocobalamin (B-12 IJ) Inject 1,000 mcg as directed every 30 (thirty) days.    [provider]  cyclobenzaprine (FLEXERIL) 5 MG tablet Take 1-2 tablets 3 times daily as needed 07/04/17   Laban Emperor, PA-C  gabapentin (NEURONTIN) 300 MG capsule Take 600 mg by mouth 3 (three) times daily.     [provider]  insulin aspart (NOVOLOG FLEXPEN) 100 UNIT/ML FlexPen Inject 10-20 Units into the skin 3 (three) times daily with meals. Sliding scale 02/04/14   Buriev, Arie Sabina, MD  lidocaine (LIDODERM) 5 % Place 1 patch onto the skin every 12 (twelve) hours. Remove & Discard patch within 12 hours or as directed by MD 07/04/17 07/04/18  Laban Emperor, PA-C  lisinopril (PRINIVIL,ZESTRIL) 10 MG tablet Take 10 mg by mouth at bedtime. Reported on 10/19/2015    [provider]  meloxicam (MOBIC) 15 MG tablet Take 1 tablet (15 mg total) by mouth daily. 04/07/17   Triplett, Johnette Abraham B, FNP  nitroGLYCERIN (NITROSTAT) 0.4 MG SL  tablet Place 0.4 mg under the tongue every 5 (five) minutes as needed for chest pain.    [provider]  oxyCODONE (ROXICODONE) 15 MG immediate release tablet Limit 1/2-1 tab by mouth 4-7 times per day if tolerated 04/19/16   Mohammed Kindle, MD  rOPINIRole (REQUIP) 0.25 MG tablet Take 0.25 mg by mouth 2 (two) times daily.    [provider]  spironolactone (ALDACTONE) 25 MG tablet Take 25 mg by mouth daily. Reported on 09/21/2015 03/24/15   [provider]  tiZANidine (ZANAFLEX) 4 MG tablet Limit 1 tab by mouth per day or 2-3 times per day if tolerated 04/19/16   Mohammed Kindle, MD  topiramate (TOPAMAX) 100 MG tablet Take 100 mg by mouth at bedtime. Take with 25 mg tablet for a 125 mg dose    [provider]  topiramate (TOPAMAX) 25 MG tablet Take 25 mg by mouth at bedtime.    [provider]  TRULICITY 1.5 BO/1.7PZ SOPN INJECT 1.5 MG SUBCUTANEOUSLY EVERY 7 (SEVEN) DAYS. 08/20/16    [provider]    Allergies Bactrim [sulfamethoxazole-trimethoprim]; Ciprofloxacin; Rocephin [ceftriaxone sodium in dextrose]; Vancomycin; Amoxicillin; Ceftriaxone; Azithromycin; and Food  Family History  Problem Relation Age of Onset  . Depression Mother   . Drug abuse Mother   . Early death Mother   . Hypertension Mother   . Varicose Veins Mother   . Diabetes Father   . Early death Father   . Hyperlipidemia Father   . Heart Problems Father        enlarged geart  . Breast cancer Maternal Grandmother        60's  . Breast cancer Maternal Aunt     Social History Social History   Tobacco Use  . Smoking status: Never Smoker  . Smokeless tobacco: Never Used  Substance Use Topics  . Alcohol use: No  . Drug use: No     Review of Systems  Constitutional: No fever/chills Cardiovascular: No chest pain. Respiratory:  No SOB. Gastrointestinal: No abdominal pain.  No nausea, no vomiting.  Musculoskeletal: Positive for upper back and shoulder pain. Skin: Negative for rash, abrasions, lacerations, ecchymosis. Neurological: Negative for headaches, numbness or tingling   ____________________________________________   PHYSICAL EXAM:  VITAL SIGNS: ED Triage Vitals  Enc Vitals Group     BP 07/04/17 1201 (!) 152/91     Pulse Rate 07/04/17 1201 79     Resp 07/04/17 1201 16     Temp 07/04/17 1201 98.3 F (36.8 C)     Temp Source 07/04/17 1201 Oral     SpO2 07/04/17 1201 98 %     Weight 07/04/17 1202 203 lb (92.1 kg)     Height 07/04/17 1202 5\' 4"  (1.626 m)     Head Circumference --      Peak Flow --      Pain Score 07/04/17 1201 8     Pain Loc --      Pain Edu? --      Excl. in [place]? --      Constitutional: Alert and oriented. Well appearing and in no acute distress. Eyes: Conjunctivae are normal. PERRL. EOMI. Head: Atraumatic. ENT:      Ears:      Nose: No congestion/rhinnorhea.      Mouth/Throat: Mucous membranes are moist.  Neck: No stridor.  No  cervical spine tenderness to palpation. Cardiovascular: Normal rate, regular rhythm.  Good peripheral circulation. Respiratory: Normal respiratory effort without tachypnea or retractions. Lungs CTAB.  Good air entry to the bases with no decreased or absent breath sounds. Gastrointestinal: Bowel sounds 4 quadrants. Soft and nontender to palpation. No guarding or rigidity. No palpable masses. No distention.  Musculoskeletal: Full range of motion to all extremities. No gross deformities appreciated.  Tenderness to palpation over upper left back.  Limited range of motion of left shoulder due to pain.  No ecchymosis. Neurologic:  Normal speech and language. No gross focal neurologic deficits are appreciated.  Skin:  Skin is warm, dry and intact. No rash noted. Psychiatric: Mood and affect are normal. Speech and behavior are normal. Patient exhibits appropriate insight and judgement.   ____________________________________________   LABS (all labs ordered are listed, but only abnormal results are displayed)  Labs Reviewed - No data to display ____________________________________________  EKG   ____________________________________________  RADIOLOGY Robinette Haines, personally viewed and evaluated these images (plain radiographs) as part of my medical decision making, as well as reviewing the written report by the radiologist.  Dg Shoulder Left  Result Date: 07/04/2017 CLINICAL DATA:  LEFT shoulder pain after falling yesterday EXAM: LEFT SHOULDER - 2+ VIEW COMPARISON:  MRI LEFT shoulder 11/09/2014 FINDINGS: AC joint alignment normal. Osseous mineralization normal. No glenohumeral fracture dislocation. Visualized LEFT ribs intact. IMPRESSION: No acute osseous abnormalities. Electronically Signed   By: Lavonia Dana M.D.   On: 07/04/2017 12:49    ____________________________________________    PROCEDURES  Procedure(s) performed:    Procedures    Medications  lidocaine (LIDODERM) 5  % 1 patch (1 patch Transdermal Patch Applied 07/04/17 1338)     ____________________________________________   INITIAL IMPRESSION / ASSESSMENT AND PLAN / ED COURSE  Pertinent labs & imaging results that were available during my care of the patient were reviewed by me and considered in my medical decision making (see chart for details).  Review of the [place] CSRS was performed in accordance of the [place] prior to dispensing any controlled drugs.   Patient presented to the emergency department for evaluation after fall.  Vital signs and exam are reassuring.  Shoulder x-ray negative for acute bony abnormalities.  Patient has no additional questions or concerns at this time.  Patient will be discharged home with prescriptions for Lidoderm and Flexeril. Patient is to follow up with PCP as directed. Patient is given ED precautions to return to the ED for any worsening or new symptoms.   ____________________________________________  FINAL CLINICAL IMPRESSION(S) / ED DIAGNOSES  Final diagnoses:  Fall, initial encounter  Musculoskeletal pain      NEW MEDICATIONS STARTED DURING THIS VISIT:       This chart was dictated using voice recognition software/Dragon. Despite best efforts to proofread, errors can occur which can change the meaning. Any change was purely unintentional.    Laban Emperor, PA-C 07/04/17 Aryeh Butterfield, [place], MD 07/04/17 [phone]

## 2017-07-04 NOTE — ED Notes (Signed)
First Nurse Note:  Patient states she fell yesterday, tripped in yard, here today complaining of left shoulder pain.

## 2017-07-04 NOTE — ED Notes (Signed)
Patient verbalized understanding of discharge instructions and follow-up care. Ambulatory to lobby with NAD noted.

## 2017-07-09 DIAGNOSIS — F5101 Primary insomnia: Secondary | ICD-10-CM | POA: Diagnosis not present

## 2017-07-17 DIAGNOSIS — Z5181 Encounter for therapeutic drug level monitoring: Secondary | ICD-10-CM | POA: Diagnosis not present

## 2017-07-17 DIAGNOSIS — M5481 Occipital neuralgia: Secondary | ICD-10-CM | POA: Diagnosis not present

## 2017-07-17 DIAGNOSIS — M545 Low back pain: Secondary | ICD-10-CM | POA: Diagnosis not present

## 2017-07-17 DIAGNOSIS — M542 Cervicalgia: Secondary | ICD-10-CM | POA: Diagnosis not present

## 2017-08-10 DIAGNOSIS — H10812 Pingueculitis, left eye: Secondary | ICD-10-CM | POA: Diagnosis not present

## 2017-08-16 DIAGNOSIS — M542 Cervicalgia: Secondary | ICD-10-CM | POA: Diagnosis not present

## 2017-08-16 DIAGNOSIS — M5481 Occipital neuralgia: Secondary | ICD-10-CM | POA: Diagnosis not present

## 2017-08-16 DIAGNOSIS — M545 Low back pain: Secondary | ICD-10-CM | POA: Diagnosis not present

## 2017-08-16 DIAGNOSIS — Z5181 Encounter for therapeutic drug level monitoring: Secondary | ICD-10-CM | POA: Diagnosis not present

## 2017-08-20 DIAGNOSIS — E119 Type 2 diabetes mellitus without complications: Secondary | ICD-10-CM | POA: Diagnosis not present

## 2017-08-20 DIAGNOSIS — I10 Essential (primary) hypertension: Secondary | ICD-10-CM | POA: Diagnosis not present

## 2017-08-20 DIAGNOSIS — Z9884 Bariatric surgery status: Secondary | ICD-10-CM | POA: Diagnosis not present

## 2017-08-20 DIAGNOSIS — Z713 Dietary counseling and surveillance: Secondary | ICD-10-CM | POA: Diagnosis not present

## 2017-08-20 DIAGNOSIS — E669 Obesity, unspecified: Secondary | ICD-10-CM | POA: Diagnosis not present

## 2017-08-23 DIAGNOSIS — M25562 Pain in left knee: Secondary | ICD-10-CM | POA: Diagnosis not present

## 2017-08-23 DIAGNOSIS — G8929 Other chronic pain: Secondary | ICD-10-CM | POA: Diagnosis not present

## 2017-08-23 DIAGNOSIS — Z794 Long term (current) use of insulin: Secondary | ICD-10-CM | POA: Diagnosis not present

## 2017-08-23 DIAGNOSIS — E669 Obesity, unspecified: Secondary | ICD-10-CM | POA: Diagnosis not present

## 2017-08-23 DIAGNOSIS — E119 Type 2 diabetes mellitus without complications: Secondary | ICD-10-CM | POA: Diagnosis not present

## 2017-09-10 DIAGNOSIS — M25512 Pain in left shoulder: Secondary | ICD-10-CM | POA: Diagnosis not present

## 2017-09-10 DIAGNOSIS — G8929 Other chronic pain: Secondary | ICD-10-CM | POA: Insufficient documentation

## 2017-09-10 DIAGNOSIS — M1712 Unilateral primary osteoarthritis, left knee: Secondary | ICD-10-CM | POA: Insufficient documentation

## 2017-09-10 DIAGNOSIS — M2550 Pain in unspecified joint: Secondary | ICD-10-CM | POA: Diagnosis not present

## 2017-09-10 DIAGNOSIS — M75121 Complete rotator cuff tear or rupture of right shoulder, not specified as traumatic: Secondary | ICD-10-CM | POA: Diagnosis not present

## 2017-09-12 DIAGNOSIS — I10 Essential (primary) hypertension: Secondary | ICD-10-CM | POA: Diagnosis not present

## 2017-09-12 DIAGNOSIS — E7849 Other hyperlipidemia: Secondary | ICD-10-CM | POA: Diagnosis not present

## 2017-09-12 DIAGNOSIS — I428 Other cardiomyopathies: Secondary | ICD-10-CM | POA: Diagnosis not present

## 2017-09-12 DIAGNOSIS — E669 Obesity, unspecified: Secondary | ICD-10-CM | POA: Diagnosis not present

## 2017-09-12 DIAGNOSIS — E7800 Pure hypercholesterolemia, unspecified: Secondary | ICD-10-CM | POA: Diagnosis not present

## 2017-09-12 DIAGNOSIS — Z0000 Encounter for general adult medical examination without abnormal findings: Secondary | ICD-10-CM | POA: Diagnosis not present

## 2017-09-12 DIAGNOSIS — R600 Localized edema: Secondary | ICD-10-CM | POA: Diagnosis not present

## 2017-09-12 DIAGNOSIS — M545 Low back pain: Secondary | ICD-10-CM | POA: Diagnosis not present

## 2017-09-12 DIAGNOSIS — F329 Major depressive disorder, single episode, unspecified: Secondary | ICD-10-CM | POA: Diagnosis not present

## 2017-09-12 DIAGNOSIS — I509 Heart failure, unspecified: Secondary | ICD-10-CM | POA: Diagnosis not present

## 2017-09-12 DIAGNOSIS — R51 Headache: Secondary | ICD-10-CM | POA: Diagnosis not present

## 2017-09-12 DIAGNOSIS — F5101 Primary insomnia: Secondary | ICD-10-CM | POA: Diagnosis not present

## 2017-09-12 DIAGNOSIS — E119 Type 2 diabetes mellitus without complications: Secondary | ICD-10-CM | POA: Diagnosis not present

## 2017-09-12 DIAGNOSIS — I2510 Atherosclerotic heart disease of native coronary artery without angina pectoris: Secondary | ICD-10-CM | POA: Diagnosis not present

## 2017-09-12 DIAGNOSIS — R0602 Shortness of breath: Secondary | ICD-10-CM | POA: Diagnosis not present

## 2017-09-17 DIAGNOSIS — M542 Cervicalgia: Secondary | ICD-10-CM | POA: Diagnosis not present

## 2017-09-17 DIAGNOSIS — M545 Low back pain: Secondary | ICD-10-CM | POA: Diagnosis not present

## 2017-09-17 DIAGNOSIS — Z5181 Encounter for therapeutic drug level monitoring: Secondary | ICD-10-CM | POA: Diagnosis not present

## 2017-09-17 DIAGNOSIS — M5481 Occipital neuralgia: Secondary | ICD-10-CM | POA: Diagnosis not present

## 2017-09-19 DIAGNOSIS — G44221 Chronic tension-type headache, intractable: Secondary | ICD-10-CM | POA: Insufficient documentation

## 2017-09-19 DIAGNOSIS — R42 Dizziness and giddiness: Secondary | ICD-10-CM | POA: Diagnosis not present

## 2017-09-19 DIAGNOSIS — G2581 Restless legs syndrome: Secondary | ICD-10-CM | POA: Diagnosis not present

## 2017-09-19 DIAGNOSIS — G479 Sleep disorder, unspecified: Secondary | ICD-10-CM | POA: Diagnosis not present

## 2017-09-19 DIAGNOSIS — R252 Cramp and spasm: Secondary | ICD-10-CM | POA: Diagnosis not present

## 2017-09-19 DIAGNOSIS — G5603 Carpal tunnel syndrome, bilateral upper limbs: Secondary | ICD-10-CM | POA: Diagnosis not present

## 2017-10-02 DIAGNOSIS — E1165 Type 2 diabetes mellitus with hyperglycemia: Secondary | ICD-10-CM | POA: Diagnosis not present

## 2017-10-02 DIAGNOSIS — E1142 Type 2 diabetes mellitus with diabetic polyneuropathy: Secondary | ICD-10-CM | POA: Diagnosis not present

## 2017-10-02 DIAGNOSIS — D510 Vitamin B12 deficiency anemia due to intrinsic factor deficiency: Secondary | ICD-10-CM | POA: Diagnosis not present

## 2017-10-02 DIAGNOSIS — Z794 Long term (current) use of insulin: Secondary | ICD-10-CM | POA: Diagnosis not present

## 2017-10-02 DIAGNOSIS — Z72820 Sleep deprivation: Secondary | ICD-10-CM | POA: Diagnosis not present

## 2017-10-02 DIAGNOSIS — E559 Vitamin D deficiency, unspecified: Secondary | ICD-10-CM | POA: Diagnosis not present

## 2017-10-17 DIAGNOSIS — M5481 Occipital neuralgia: Secondary | ICD-10-CM | POA: Diagnosis not present

## 2017-10-17 DIAGNOSIS — Z5181 Encounter for therapeutic drug level monitoring: Secondary | ICD-10-CM | POA: Diagnosis not present

## 2017-10-17 DIAGNOSIS — M542 Cervicalgia: Secondary | ICD-10-CM | POA: Diagnosis not present

## 2017-10-17 DIAGNOSIS — M545 Low back pain: Secondary | ICD-10-CM | POA: Diagnosis not present

## 2017-11-02 DIAGNOSIS — R252 Cramp and spasm: Secondary | ICD-10-CM | POA: Diagnosis not present

## 2017-11-02 DIAGNOSIS — G479 Sleep disorder, unspecified: Secondary | ICD-10-CM | POA: Diagnosis not present

## 2017-11-02 DIAGNOSIS — G2581 Restless legs syndrome: Secondary | ICD-10-CM | POA: Diagnosis not present

## 2017-11-02 DIAGNOSIS — G5603 Carpal tunnel syndrome, bilateral upper limbs: Secondary | ICD-10-CM | POA: Diagnosis not present

## 2017-11-02 DIAGNOSIS — R5383 Other fatigue: Secondary | ICD-10-CM | POA: Insufficient documentation

## 2017-11-02 DIAGNOSIS — G44221 Chronic tension-type headache, intractable: Secondary | ICD-10-CM | POA: Diagnosis not present

## 2017-11-02 DIAGNOSIS — R42 Dizziness and giddiness: Secondary | ICD-10-CM | POA: Diagnosis not present

## 2017-11-02 DIAGNOSIS — G43109 Migraine with aura, not intractable, without status migrainosus: Secondary | ICD-10-CM | POA: Diagnosis not present

## 2017-11-13 DIAGNOSIS — Z113 Encounter for screening for infections with a predominantly sexual mode of transmission: Secondary | ICD-10-CM | POA: Diagnosis not present

## 2017-11-13 DIAGNOSIS — Z114 Encounter for screening for human immunodeficiency virus [HIV]: Secondary | ICD-10-CM | POA: Diagnosis not present

## 2017-11-13 DIAGNOSIS — N941 Unspecified dyspareunia: Secondary | ICD-10-CM | POA: Diagnosis not present

## 2017-11-13 DIAGNOSIS — Z01419 Encounter for gynecological examination (general) (routine) without abnormal findings: Secondary | ICD-10-CM | POA: Diagnosis not present

## 2017-11-13 DIAGNOSIS — R102 Pelvic and perineal pain: Secondary | ICD-10-CM | POA: Diagnosis not present

## 2017-11-13 DIAGNOSIS — N852 Hypertrophy of uterus: Secondary | ICD-10-CM | POA: Diagnosis not present

## 2017-11-14 DIAGNOSIS — M545 Low back pain: Secondary | ICD-10-CM | POA: Diagnosis not present

## 2017-11-14 DIAGNOSIS — M5481 Occipital neuralgia: Secondary | ICD-10-CM | POA: Diagnosis not present

## 2017-11-14 DIAGNOSIS — Z5181 Encounter for therapeutic drug level monitoring: Secondary | ICD-10-CM | POA: Diagnosis not present

## 2017-11-14 DIAGNOSIS — M542 Cervicalgia: Secondary | ICD-10-CM | POA: Diagnosis not present

## 2017-11-26 ENCOUNTER — Emergency Department: Payer: PPO

## 2017-11-26 ENCOUNTER — Encounter: Payer: Self-pay | Admitting: *Deleted

## 2017-11-26 ENCOUNTER — Other Ambulatory Visit: Payer: Self-pay

## 2017-11-26 ENCOUNTER — Emergency Department
Admission: EM | Admit: 2017-11-26 | Discharge: 2017-11-26 | Disposition: A | Discharge status: Home / Self Care | Payer: PPO | Attending: Emergency Medicine | Admitting: Emergency Medicine

## 2017-11-26 DIAGNOSIS — Y939 Activity, unspecified: Secondary | ICD-10-CM | POA: Insufficient documentation

## 2017-11-26 DIAGNOSIS — Z79899 Other long term (current) drug therapy: Secondary | ICD-10-CM | POA: Insufficient documentation

## 2017-11-26 DIAGNOSIS — I110 Hypertensive heart disease with heart failure: Secondary | ICD-10-CM | POA: Insufficient documentation

## 2017-11-26 DIAGNOSIS — Y9289 Other specified places as the place of occurrence of the external cause: Secondary | ICD-10-CM | POA: Insufficient documentation

## 2017-11-26 DIAGNOSIS — S63632A Sprain of interphalangeal joint of right middle finger, initial encounter: Secondary | ICD-10-CM | POA: Diagnosis not present

## 2017-11-26 DIAGNOSIS — I509 Heart failure, unspecified: Secondary | ICD-10-CM | POA: Insufficient documentation

## 2017-11-26 DIAGNOSIS — I2510 Atherosclerotic heart disease of native coronary artery without angina pectoris: Secondary | ICD-10-CM | POA: Diagnosis not present

## 2017-11-26 DIAGNOSIS — M79644 Pain in right finger(s): Secondary | ICD-10-CM | POA: Diagnosis not present

## 2017-11-26 DIAGNOSIS — E119 Type 2 diabetes mellitus without complications: Secondary | ICD-10-CM | POA: Diagnosis not present

## 2017-11-26 DIAGNOSIS — Z794 Long term (current) use of insulin: Secondary | ICD-10-CM | POA: Insufficient documentation

## 2017-11-26 DIAGNOSIS — W2209XA Striking against other stationary object, initial encounter: Secondary | ICD-10-CM | POA: Insufficient documentation

## 2017-11-26 DIAGNOSIS — Y990 Civilian activity done for income or pay: Secondary | ICD-10-CM | POA: Insufficient documentation

## 2017-11-26 DIAGNOSIS — S63612A Unspecified sprain of right middle finger, initial encounter: Secondary | ICD-10-CM | POA: Insufficient documentation

## 2017-11-26 DIAGNOSIS — S6991XA Unspecified injury of right wrist, hand and finger(s), initial encounter: Secondary | ICD-10-CM | POA: Diagnosis not present

## 2017-11-26 DIAGNOSIS — S60943A Unspecified superficial injury of left middle finger, initial encounter: Secondary | ICD-10-CM | POA: Diagnosis present

## 2017-11-26 MED ORDER — MELOXICAM 15 MG PO TABS: 15.0000 mg | ORAL_TABLET | Freq: Every day | ORAL | 0 refills | Status: DC

## 2017-11-26 NOTE — ED Provider Notes (Signed)
[facility] Emergency Department Provider Note  ____________________________________________  Time seen: Approximately 8:33 PM  I have reviewed the triage vital signs and the nursing notes.   HISTORY  Chief Complaint Finger Injury    HPI Marissa [name] is a 42 y.o. female who presents the emergency department complaining of third digit pain.  Patient reports that several days ago, she was walking her dog, he bolted and she tried to grab the toe collar.  Patient reports that her finger became twisted in the collar causing pain and swelling to the interphalangeal joint.  Today, patient was at work, attempting to perform her job when she struck the edge on a container at work.  Patient had immediate increase in pain and swelling to the digit.  Due to ongoing symptoms with increased pain after repeat injury, patient presents concern for deeper underlying injury.  She is able to flex and extend the digit.  No numbness or tingling.  Past Medical History:  Diagnosis Date  . Allergy   . Anemia   . Anxiety   . Arthritis   . Back pain, chronic   . Cardiomyopathy, dilated, nonischemic ([place])   . CHF (congestive heart failure) ([place])   . Cholelithiasis   . Coronary artery disease   . Depression   . Diabetes mellitus without complication (HCC)    fasting cbg 50-140s  . Dysrhythmia    tachycardia  . GERD (gastroesophageal reflux disease)    otc meds  . Headache(784.0)   . Hypercholesteremia   . Hyperlipemia   . Hypertension   . Insulin pump in place    pt had insulin pump but it is now removed (02-18-16)  . MI, old    2006  . Migraines    once/month maybe - can last up to two weeks  . Myocardial infarction [facility]) 2006   "due to medication"; no evidence of ischemia or infarction by nuclear stress test '11  . Neck pain, chronic   . Neuropathy   . Pneumonia 2015   [facility]  . Prolonged QT interval syndrome   . Renal insufficiency   . Restless leg syndrome,  controlled   . Sepsis ([place]) 2016   secondary to surgery  . Sleep apnea    does not use cpap since losing alot of weight  . Vision loss    due to diabetes    Patient Active Problem List   Diagnosis Date Noted  . Paresthesia 09/06/2016  . Cardiomyopathy ([place]) 04/23/2015  . Long term current use of antibiotics 04/23/2015  . Biliary colic 99/83/3825  . Calculus of gallbladder with acute cholecystitis 04/07/2015  . Bilateral occipital neuralgia 01/21/2015  . Dizziness 01/04/2015  . Headache 01/04/2015  . DDD (degenerative disc disease), cervical 12/18/2014  . DDD (degenerative disc disease), thoracic 12/18/2014  . DDD (degenerative disc disease), lumbosacral 12/18/2014  . Neuropathy due to secondary diabetes ([place]) 12/18/2014  . Migraine 12/18/2014  . Hypercholesterolemia without hypertriglyceridemia 08/28/2014  . HPV test positive 07/17/2014  . Class 2 obesity 04/15/2014  . CAD in native artery 04/15/2014  . Breathlessness on exertion 03/24/2014  . Wound infection complicating hardware ([place]) 02/25/2014  . Nausea alone 02/01/2014  . Abdominal pain, unspecified site 02/01/2014  . Severe sepsis(995.92) 01/30/2014  . History of lumbar laminectomy 01/30/2014  . Acute renal failure ([place]) 01/30/2014  . Anemia 01/30/2014  . HTN (hypertension) 01/30/2014  . DM (diabetes mellitus), type 2 with renal complications ([place]) 05/39/7673  . Wound infection 01/25/2014  .  Acute respiratory failure with hypoxia ([place]) 01/25/2014  . Type 2 diabetes mellitus treated with insulin ([place]) 01/13/2014  . Essential (primary) hypertension 01/13/2014  . Difficulty speaking 01/13/2014  . Clinical depression 01/13/2014  . Back pain 11/01/2013  . HNP (herniated nucleus pulposus), lumbar 10/29/2013    Past Surgical History:  Procedure Laterality Date  . BACK SURGERY  2011   Lumbar   . CARDIAC CATHETERIZATION  07/18/2004   50-60% mid LAD, minor luminal irregularities RCA, normal LM and CX, EF 50-55% [facility])  . carpel tunnel Bilateral   . CERVICAL FUSION  2006  . CHOLECYSTECTOMY N/A 04/08/2015   Procedure: LAPAROSCOPIC CHOLECYSTECTOMY WITH INTRAOPERATIVE CHOLANGIOGRAM;  Surgeon: Florene Glen, MD;  Location: [facility] ORS;  Service: General;  Laterality: N/A;  . LUMBAR LAMINECTOMY/DECOMPRESSION MICRODISCECTOMY Right 05/07/2013   Procedure: Right Lumbar one-two laminectomy;  Surgeon: Elaina Hoops, MD;  Location: [place] NEURO ORS;  Service: Neurosurgery;  Laterality: Right;  . LUMBAR LAMINECTOMY/DECOMPRESSION MICRODISCECTOMY Left 10/29/2013   Procedure: Left Lumbar five-Sacral one Laminectomy;  Surgeon: Elaina Hoops, MD;  Location: [place] NEURO ORS;  Service: Neurosurgery;  Laterality: Left;  . LUMBAR WOUND DEBRIDEMENT N/A 01/25/2014   Procedure: LUMBAR WOUND DEBRIDEMENT;  Surgeon: Charlie Pitter, MD;  Location: [place] NEURO ORS;  Service: Neurosurgery;  Laterality: N/A;  . LUMBAR WOUND DEBRIDEMENT N/A 02/25/2014   Procedure: LUMBAR WOUND DEBRIDEMENT;  Surgeon: Elaina Hoops, MD;  Location: [place] NEURO ORS;  Service: Neurosurgery;  Laterality: N/A;  . SALPINGOOPHORECTOMY Bilateral    1 1997. 2nd 2001  . SHOULDER ARTHROSCOPY WITH BICEPSTENOTOMY Left 03/24/2016   Procedure: shoulder arthroscopy with biceps TENOTOMY, removal loose body, limited synovectomy;  Surgeon: Leanor Kail, MD;  Location: [facility] ORS;  Service: Orthopedics;  Laterality: Left;  . shoulder sugery     7/17 rotator cuff    Prior to Admission medications   Medication Sig Start Date End Date Taking? Authorizing Provider  amitriptyline (ELAVIL) 100 MG tablet Take 100 mg by mouth at bedtime.     [provider]  aspirin-acetaminophen-caffeine (EXCEDRIN MIGRAINE) [phone] MG tablet Take 1 tablet by mouth every 6 (six) hours as needed for headache.    [provider]  carvedilol (COREG) 25 MG tablet Take 1 tablet (25 mg total) by mouth 2 (two) times daily with a meal. 02/04/14   Buriev, Arie Sabina, MD  cloNIDine (CATAPRES) 0.2  MG tablet Take 0.2 mg by mouth 2 (two) times daily. 02/10/16   [provider]  Cyanocobalamin (B-12 IJ) Inject 1,000 mcg as directed every 30 (thirty) days.    [provider]  cyclobenzaprine (FLEXERIL) 5 MG tablet Take 1-2 tablets 3 times daily as needed 07/04/17   Laban Emperor, PA-C  gabapentin (NEURONTIN) 300 MG capsule Take 600 mg by mouth 3 (three) times daily.     [provider]  insulin aspart (NOVOLOG FLEXPEN) 100 UNIT/ML FlexPen Inject 10-20 Units into the skin 3 (three) times daily with meals. Sliding scale 02/04/14   Buriev, Arie Sabina, MD  lidocaine (LIDODERM) 5 % Place 1 patch onto the skin every 12 (twelve) hours. Remove & Discard patch within 12 hours or as directed by MD 07/04/17 07/04/18  Laban Emperor, PA-C  lisinopril (PRINIVIL,ZESTRIL) 10 MG tablet Take 10 mg by mouth at bedtime. Reported on 10/19/2015    [provider]  meloxicam (MOBIC) 15 MG tablet Take 1 tablet (15 mg total) by mouth daily. 11/26/17   Sheyann Sulton, Charline Bills, PA-C  nitroGLYCERIN (NITROSTAT) 0.4 MG  SL tablet Place 0.4 mg under the tongue every 5 (five) minutes as needed for chest pain.    [provider]  oxyCODONE (ROXICODONE) 15 MG immediate release tablet Limit 1/2-1 tab by mouth 4-7 times per day if tolerated 04/19/16   Mohammed Kindle, MD  rOPINIRole (REQUIP) 0.25 MG tablet Take 0.25 mg by mouth 2 (two) times daily.    [provider]  spironolactone (ALDACTONE) 25 MG tablet Take 25 mg by mouth daily. Reported on 09/21/2015 03/24/15   [provider]  tiZANidine (ZANAFLEX) 4 MG tablet Limit 1 tab by mouth per day or 2-3 times per day if tolerated 04/19/16   Mohammed Kindle, MD  topiramate (TOPAMAX) 100 MG tablet Take 100 mg by mouth at bedtime. Take with 25 mg tablet for a 125 mg dose    [provider]  topiramate (TOPAMAX) 25 MG tablet Take 25 mg by mouth at bedtime.    [provider]  TRULICITY 1.5 AY/3.0ZS SOPN INJECT 1.5 MG  SUBCUTANEOUSLY EVERY 7 (SEVEN) DAYS. 08/20/16   [provider]    Allergies Bactrim [sulfamethoxazole-trimethoprim]; Ciprofloxacin; Rocephin [ceftriaxone sodium in dextrose]; Vancomycin; Amoxicillin; Ceftriaxone; Azithromycin; and Food  Family History  Problem Relation Age of Onset  . Depression Mother   . Drug abuse Mother   . Early death Mother   . Hypertension Mother   . Varicose Veins Mother   . Diabetes Father   . Early death Father   . Hyperlipidemia Father   . Heart Problems Father        enlarged geart  . Breast cancer Maternal Grandmother        60's  . Breast cancer Maternal Aunt     Social History Social History   Tobacco Use  . Smoking status: Never Smoker  . Smokeless tobacco: Never Used  Substance Use Topics  . Alcohol use: No  . Drug use: No     Review of Systems  Constitutional: No fever/chills Eyes: No visual changes.  Cardiovascular: no chest pain. Respiratory: no cough. No SOB. Gastrointestinal: No abdominal pain.  No nausea, no vomiting.  Musculoskeletal: Positive for pain to the third digit of the right hand Skin: Negative for rash, abrasions, lacerations, ecchymosis. Neurological: Negative for headaches, focal weakness or numbness. 10-point ROS otherwise negative.  ____________________________________________   PHYSICAL EXAM:  VITAL SIGNS: ED Triage Vitals  Enc Vitals Group     BP 11/26/17 1859 126/79     Pulse Rate 11/26/17 1859 86     Resp 11/26/17 1859 18     Temp 11/26/17 1859 98.3 F (36.8 C)     Temp Source 11/26/17 1859 Oral     SpO2 11/26/17 1859 100 %     Weight 11/26/17 1857 169 lb (76.7 kg)     Height 11/26/17 1857 5\' 5"  (1.651 m)     Head Circumference --      Peak Flow --      Pain Score 11/26/17 1857 7     Pain Loc --      Pain Edu? --      Excl. in [place]? --      Constitutional: Alert and oriented. Well appearing and in no acute distress. Eyes: Conjunctivae are normal. PERRL. EOMI. Head:  Atraumatic. Neck: No stridor.    Cardiovascular: Normal rate, regular rhythm. Normal S1 and S2.  Good peripheral circulation. Respiratory: Normal respiratory effort without tachypnea or retractions. Lungs CTAB. Good air entry to the bases with no decreased or absent breath  sounds. Musculoskeletal: Full range of motion to all extremities. No gross deformities appreciated.  No gross deformity to the right hand upon inspection.  Patient does have mild edema to the PIP joint third digit right hand.  Patient is able to flex and extend the digit appropriately.  Patient is very tender to palpation of the PIP joint but no other tenderness to palpation of the osseous structures of the digit.  No tenderness to palpation of the osseous structures of the hand.  Sensation and cap refill intact all 5 digits right hand. Neurologic:  Normal speech and language. No gross focal neurologic deficits are appreciated.  Skin:  Skin is warm, dry and intact. No rash noted. Psychiatric: Mood and affect are normal. Speech and behavior are normal. Patient exhibits appropriate insight and judgement.   ____________________________________________   LABS (all labs ordered are listed, but only abnormal results are displayed)  Labs Reviewed - No data to display ____________________________________________  EKG   ____________________________________________  RADIOLOGY Diamantina Providence Lelan Cush, personally viewed and evaluated these images (plain radiographs) as part of my medical decision making, as well as reviewing the written report by the radiologist.  Dg Finger Middle Right  Result Date: 11/26/2017 CLINICAL DATA:  42 year old female with right middle finger pain after caught in dog collar a few days ago. Initial encounter. EXAM: RIGHT MIDDLE FINGER 2+V COMPARISON:  None. FINDINGS: Soft tissue swelling right third proximal interphalangeal joint space level without underlying fracture or dislocation. Fingers held in  extension. Prominent vascular groove incidentally noted. IMPRESSION: Soft tissue swelling right third proximal interphalangeal joint space level without underlying fracture or dislocation. Electronically Signed   By: Genia Del M.D.   On: 11/26/2017 20:06    ____________________________________________    PROCEDURES  Procedure(s) performed:    .Splint Application Date/Time: 0/93/8182 8:50 PM Performed by: Darletta Moll, PA-C Authorized by: Darletta Moll, PA-C   Consent:    Consent obtained:  Verbal   Consent given by:  Patient   Risks discussed:  Pain Pre-procedure details:    Sensation:  Normal Procedure details:    Laterality:  Right   Location:  Finger   Finger:  R long finger   Cast type:  Finger   Splint type:  Finger   Supplies:  Aluminum splint and elastic bandage Post-procedure details:    Pain:  Unchanged   Sensation:  Normal   Patient tolerance of procedure:  Tolerated well, no immediate complications      Medications - No data to display   ____________________________________________   INITIAL IMPRESSION / ASSESSMENT AND PLAN / ED COURSE  Pertinent labs & imaging results that were available during my care of the patient were reviewed by me and considered in my medical decision making (see chart for details).  Review of the  [place]RS was performed in accordance of the [place] prior to dispensing any controlled drugs.     Patient's diagnosis is consistent with sprained third digit of the right hand.  Differential included fracture, dislocation, ligament rupture.  Patient's exam is reassuring.  X-ray reveals no acute osseous abnormality.  Finger splinted for improved healing and protection.. Patient will be discharged home with prescriptions for meloxicam. Patient is to follow up with orthopedics as needed or otherwise directed. Patient is given ED precautions to return to the ED for any worsening or new  symptoms.     ____________________________________________  FINAL CLINICAL IMPRESSION(S) / ED DIAGNOSES  Final diagnoses:  Sprain of interphalangeal joint of right middle  finger, initial encounter      NEW MEDICATIONS STARTED DURING THIS VISIT:  ED Discharge Orders        Ordered    meloxicam (MOBIC) 15 MG tablet  Daily     11/26/17 2046          This chart was dictated using voice recognition software/Dragon. Despite best efforts to proofread, errors can occur which can change the meaning. Any change was purely unintentional.    Darletta Moll, PA-C 11/26/17 2052    Arta Silence, MD 11/26/17 [phone]

## 2017-11-26 NOTE — ED Triage Notes (Signed)
Pt has pain and swelling to right middle finger.  Struck finger on a  Plastic case today.  Pt alert.

## 2017-11-28 DIAGNOSIS — Z9884 Bariatric surgery status: Secondary | ICD-10-CM | POA: Diagnosis not present

## 2017-11-28 DIAGNOSIS — D6489 Other specified anemias: Secondary | ICD-10-CM | POA: Diagnosis not present

## 2017-11-28 DIAGNOSIS — E119 Type 2 diabetes mellitus without complications: Secondary | ICD-10-CM | POA: Diagnosis not present

## 2017-11-28 DIAGNOSIS — N189 Chronic kidney disease, unspecified: Secondary | ICD-10-CM | POA: Diagnosis not present

## 2017-11-28 DIAGNOSIS — E663 Overweight: Secondary | ICD-10-CM | POA: Diagnosis not present

## 2017-11-28 DIAGNOSIS — Z713 Dietary counseling and surveillance: Secondary | ICD-10-CM | POA: Diagnosis not present

## 2017-12-12 ENCOUNTER — Encounter (HOSPITAL_COMMUNITY): Payer: Self-pay

## 2017-12-12 ENCOUNTER — Emergency Department (HOSPITAL_COMMUNITY)
Admission: EM | Admit: 2017-12-12 | Discharge: 2017-12-12 | Disposition: A | Discharge status: Home / Self Care | Payer: PPO | Attending: Emergency Medicine | Admitting: Emergency Medicine

## 2017-12-12 ENCOUNTER — Emergency Department (HOSPITAL_COMMUNITY): Payer: PPO

## 2017-12-12 ENCOUNTER — Other Ambulatory Visit: Payer: Self-pay

## 2017-12-12 DIAGNOSIS — E1122 Type 2 diabetes mellitus with diabetic chronic kidney disease: Secondary | ICD-10-CM | POA: Diagnosis not present

## 2017-12-12 DIAGNOSIS — M545 Low back pain: Secondary | ICD-10-CM | POA: Diagnosis not present

## 2017-12-12 DIAGNOSIS — M542 Cervicalgia: Secondary | ICD-10-CM | POA: Diagnosis not present

## 2017-12-12 DIAGNOSIS — Z79899 Other long term (current) drug therapy: Secondary | ICD-10-CM | POA: Insufficient documentation

## 2017-12-12 DIAGNOSIS — Z794 Long term (current) use of insulin: Secondary | ICD-10-CM | POA: Insufficient documentation

## 2017-12-12 DIAGNOSIS — N189 Chronic kidney disease, unspecified: Secondary | ICD-10-CM | POA: Diagnosis not present

## 2017-12-12 DIAGNOSIS — I509 Heart failure, unspecified: Secondary | ICD-10-CM | POA: Insufficient documentation

## 2017-12-12 DIAGNOSIS — I130 Hypertensive heart and chronic kidney disease with heart failure and stage 1 through stage 4 chronic kidney disease, or unspecified chronic kidney disease: Secondary | ICD-10-CM | POA: Insufficient documentation

## 2017-12-12 DIAGNOSIS — I2510 Atherosclerotic heart disease of native coronary artery without angina pectoris: Secondary | ICD-10-CM | POA: Diagnosis not present

## 2017-12-12 DIAGNOSIS — M5481 Occipital neuralgia: Secondary | ICD-10-CM | POA: Diagnosis not present

## 2017-12-12 DIAGNOSIS — Z5181 Encounter for therapeutic drug level monitoring: Secondary | ICD-10-CM | POA: Diagnosis not present

## 2017-12-12 DIAGNOSIS — H81399 Other peripheral vertigo, unspecified ear: Secondary | ICD-10-CM | POA: Insufficient documentation

## 2017-12-12 DIAGNOSIS — R42 Dizziness and giddiness: Secondary | ICD-10-CM | POA: Diagnosis present

## 2017-12-12 DIAGNOSIS — R51 Headache: Secondary | ICD-10-CM | POA: Diagnosis not present

## 2017-12-12 DIAGNOSIS — R519 Headache, unspecified: Secondary | ICD-10-CM

## 2017-12-12 LAB — BASIC METABOLIC PANEL
Anion gap: 7 (ref 5–15)
BUN: 13 mg/dL (ref 6–20)
CALCIUM: 9.8 mg/dL (ref 8.9–10.3)
CO2: 28 mmol/L (ref 22–32)
CREATININE: 0.82 mg/dL (ref 0.44–1.00)
Chloride: 106 mmol/L (ref 101–111)
Glucose, Bld: 108 mg/dL — ABNORMAL HIGH (ref 65–99)
Potassium: 4.4 mmol/L (ref 3.5–5.1)
SODIUM: 141 mmol/L (ref 135–145)

## 2017-12-12 LAB — CBC
HCT: 40.2 % (ref 36.0–46.0)
Hemoglobin: 13 g/dL (ref 12.0–15.0)
MCH: 29.5 pg (ref 26.0–34.0)
MCHC: 32.3 g/dL (ref 30.0–36.0)
MCV: 91.2 fL (ref 78.0–100.0)
PLATELETS: 170 10*3/uL (ref 150–400)
RBC: 4.41 MIL/uL (ref 3.87–5.11)
RDW: 14.4 % (ref 11.5–15.5)
WBC: 5.5 10*3/uL (ref 4.0–10.5)

## 2017-12-12 LAB — URINALYSIS, ROUTINE W REFLEX MICROSCOPIC
Bilirubin Urine: NEGATIVE
Glucose, UA: NEGATIVE mg/dL
Hgb urine dipstick: NEGATIVE
KETONES UR: NEGATIVE mg/dL
LEUKOCYTES UA: NEGATIVE
NITRITE: NEGATIVE
PROTEIN: NEGATIVE mg/dL
Specific Gravity, Urine: 1.024 (ref 1.005–1.030)
pH: 5 (ref 5.0–8.0)

## 2017-12-12 LAB — I-STAT TROPONIN, ED: TROPONIN I, POC: 0 ng/mL (ref 0.00–0.08)

## 2017-12-12 LAB — I-STAT BETA HCG BLOOD, ED (MC, WL, AP ONLY)

## 2017-12-12 MED ORDER — SODIUM CHLORIDE 0.9 % IV BOLUS
1000.0000 mL | Freq: Once | INTRAVENOUS | Status: AC
  Administered 2017-12-12: 1000 mL via INTRAVENOUS

## 2017-12-12 MED ORDER — METOCLOPRAMIDE HCL 10 MG PO TABS
5.0000 mg | ORAL_TABLET | Freq: Once | ORAL | Status: AC
  Administered 2017-12-12: 5 mg via ORAL
  Filled 2017-12-12: qty 1

## 2017-12-12 MED ORDER — MECLIZINE HCL 25 MG PO TABS
25.0000 mg | ORAL_TABLET | Freq: Once | ORAL | Status: AC
  Administered 2017-12-12: 25 mg via ORAL
  Filled 2017-12-12: qty 1

## 2017-12-12 MED ORDER — DIPHENHYDRAMINE HCL 25 MG PO CAPS
25.0000 mg | ORAL_CAPSULE | Freq: Once | ORAL | Status: AC
  Administered 2017-12-12: 25 mg via ORAL
  Filled 2017-12-12: qty 1

## 2017-12-12 MED ORDER — KETOROLAC TROMETHAMINE 30 MG/ML IJ SOLN
30.0000 mg | Freq: Once | INTRAMUSCULAR | Status: AC
  Administered 2017-12-12: 30 mg via INTRAVENOUS
  Filled 2017-12-12: qty 1

## 2017-12-12 MED ORDER — MECLIZINE HCL 25 MG PO TABS: 25.0000 mg | ORAL_TABLET | Freq: Three times a day (TID) | ORAL | 0 refills | Status: DC | PRN

## 2017-12-12 NOTE — ED Provider Notes (Addendum)
Patient placed in Quick Look pathway, seen and evaluated   Chief Complaint: dizziness  HPI:  Patient presenting with 2 days of dizziness, worse with movement. She was sent from her pain management clinic. She does endorse a mild headache with photophobia and reports that she has chronic migraines and this is much less severe than her typical migraines. No sudden onset thunderclap headache with onset of dizziness, she reports lightheadedness and feeling like she is going to pass out worse with standing, but also worse with head movement and feeling like the room is spinning. No trauma, no anticoagulant use. She has not been keeping well hydrated because she didn't feel well enough to get up and get something to drink.  ROS: no nausea, vomiting, c/p sob, weakness, numbness  Physical Exam:   Gen: No distress  Neuro: Awake and Alert  Skin: Warm    Focused Exam:  Lungs cta bilaterally, no LE edema, 5/5 strength in upper extremities bilaterally, 5/5 in right LE, 4/5 strength in left LE which patient reports is baseline s/p surgery. No cranial nerve deficits. Negative orthostatic vitals  Initiation of care has begun. The patient has been counseled on the process, plan, and necessity for staying for the completion/evaluation, and the remainder of the medical screening examination    Dossie Der 12/12/17 1525    Avie Echevaria B, PA-C 12/12/17 1527    Little, Wenda Overland, MD 12/12/17 [phone]

## 2017-12-12 NOTE — Discharge Instructions (Addendum)
Please read attached information. If you experience any new or worsening signs or symptoms please return to the emergency room for evaluation. Please follow-up with your primary care provider or specialist as discussed. Please use medication prescribed only as directed and discontinue taking if you have any concerning signs or symptoms.   °

## 2017-12-12 NOTE — ED Provider Notes (Signed)
[facility] EMERGENCY DEPARTMENT Provider Note   CSN: [id number] Arrival date & time: 12/12/17  1430     History   Chief Complaint Chief Complaint  Patient presents with  . Dizziness    HPI Marissa [name] is a 42 y.o. female.  HPI   42 year old female presents today with complaints of headache and dizziness.  Patient notes 2 days ago she had acute onset of dizziness, reporting "everything was spinning".  Patient notes symptoms have persisted, she reports that symptoms improved with rest, worsened with movements, opening her eyes.  She does note some very minor photophobia, no phonophobia.  She notes her headache is throbbing in nature, frontal and feels like a minor episode of her migraines.  Patient denies any acute onset headache, denies any acute neurological deficits including numbness tingling or weakness.  Patient denies any blood thinners, denies any trauma, fever, neck stiffness, chest pain or shortness of breath.  Patient denies any history of the same.  Past Medical History:  Diagnosis Date  . Allergy   . Anemia   . Anxiety   . Arthritis   . Back pain, chronic   . Cardiomyopathy, dilated, nonischemic ([place])   . CHF (congestive heart failure) ([place])   . Cholelithiasis   . Coronary artery disease   . Depression   . Diabetes mellitus without complication (HCC)    fasting cbg 50-140s  . Dysrhythmia    tachycardia  . GERD (gastroesophageal reflux disease)    otc meds  . Headache(784.0)   . Hypercholesteremia   . Hyperlipemia   . Hypertension   . Insulin pump in place    pt had insulin pump but it is now removed (02-18-16)  . MI, old    2006  . Migraines    once/month maybe - can last up to two weeks  . Myocardial infarction [facility]) 2006   "due to medication"; no evidence of ischemia or infarction by nuclear stress test '11  . Neck pain, chronic   . Neuropathy   . Pneumonia 2015   [facility]  . Prolonged QT interval syndrome   . Renal insufficiency    . Restless leg syndrome, controlled   . Sepsis ([place]) 2016   secondary to surgery  . Sleep apnea    does not use cpap since losing alot of weight  . Vision loss    due to diabetes    Patient Active Problem List   Diagnosis Date Noted  . Paresthesia 09/06/2016  . Cardiomyopathy ([place]) 04/23/2015  . Long term current use of antibiotics 04/23/2015  . Biliary colic 78/93/8101  . Calculus of gallbladder with acute cholecystitis 04/07/2015  . Bilateral occipital neuralgia 01/21/2015  . Dizziness 01/04/2015  . Headache 01/04/2015  . DDD (degenerative disc disease), cervical 12/18/2014  . DDD (degenerative disc disease), thoracic 12/18/2014  . DDD (degenerative disc disease), lumbosacral 12/18/2014  . Neuropathy due to secondary diabetes ([place]) 12/18/2014  . Migraine 12/18/2014  . Hypercholesterolemia without hypertriglyceridemia 08/28/2014  . HPV test positive 07/17/2014  . Class 2 obesity 04/15/2014  . CAD in native artery 04/15/2014  . Breathlessness on exertion 03/24/2014  . Wound infection complicating hardware ([place]) 02/25/2014  . Nausea alone 02/01/2014  . Abdominal pain, unspecified site 02/01/2014  . Severe sepsis(995.92) 01/30/2014  . History of lumbar laminectomy 01/30/2014  . Acute renal failure ([place]) 01/30/2014  . Anemia 01/30/2014  . HTN (hypertension) 01/30/2014  . DM (diabetes mellitus), type 2 with renal complications ([place]) 75/05/2584  .  Wound infection 01/25/2014  . Acute respiratory failure with hypoxia ([place]) 01/25/2014  . Type 2 diabetes mellitus treated with insulin ([place]) 01/13/2014  . Essential (primary) hypertension 01/13/2014  . Difficulty speaking 01/13/2014  . Clinical depression 01/13/2014  . Back pain 11/01/2013  . HNP (herniated nucleus pulposus), lumbar 10/29/2013    Past Surgical History:  Procedure Laterality Date  . BACK SURGERY  2011   Lumbar   . CARDIAC CATHETERIZATION  07/18/2004   50-60% mid LAD, minor luminal irregularities RCA, normal  LM and CX, EF 50-55% [facility])  . carpel tunnel Bilateral   . CERVICAL FUSION  2006  . CHOLECYSTECTOMY N/A 04/08/2015   Procedure: LAPAROSCOPIC CHOLECYSTECTOMY WITH INTRAOPERATIVE CHOLANGIOGRAM;  Surgeon: Florene Glen, MD;  Location: [facility] ORS;  Service: General;  Laterality: N/A;  . LUMBAR LAMINECTOMY/DECOMPRESSION MICRODISCECTOMY Right 05/07/2013   Procedure: Right Lumbar one-two laminectomy;  Surgeon: Elaina Hoops, MD;  Location: [place] NEURO ORS;  Service: Neurosurgery;  Laterality: Right;  . LUMBAR LAMINECTOMY/DECOMPRESSION MICRODISCECTOMY Left 10/29/2013   Procedure: Left Lumbar five-Sacral one Laminectomy;  Surgeon: Elaina Hoops, MD;  Location: [place] NEURO ORS;  Service: Neurosurgery;  Laterality: Left;  . LUMBAR WOUND DEBRIDEMENT N/A 01/25/2014   Procedure: LUMBAR WOUND DEBRIDEMENT;  Surgeon: Charlie Pitter, MD;  Location: [place] NEURO ORS;  Service: Neurosurgery;  Laterality: N/A;  . LUMBAR WOUND DEBRIDEMENT N/A 02/25/2014   Procedure: LUMBAR WOUND DEBRIDEMENT;  Surgeon: Elaina Hoops, MD;  Location: [facility] NEURO ORS;  Service: Neurosurgery;  Laterality: N/A;  . SALPINGOOPHORECTOMY Bilateral    1 1997. 2nd 2001  . SHOULDER ARTHROSCOPY WITH BICEPSTENOTOMY Left 03/24/2016   Procedure: shoulder arthroscopy with biceps TENOTOMY, removal loose body, limited synovectomy;  Surgeon: Leanor Kail, MD;  Location: [facility] ORS;  Service: Orthopedics;  Laterality: Left;  . shoulder sugery     7/17 rotator cuff     OB History   None      Home Medications    Prior to Admission medications   Medication Sig Start Date End Date Taking? Authorizing Provider  amitriptyline (ELAVIL) 100 MG tablet Take 100 mg by mouth at bedtime.    Yes [provider]  atorvastatin (LIPITOR) 40 MG tablet Take 40 mg by mouth daily. 11/11/17  Yes [provider]  butalbital-acetaminophen-caffeine (FIORICET, ESGIC) 50-325-40 MG tablet Take 1 tablet by mouth as needed. 11/06/17  Yes [provider]    carvedilol (COREG) 25 MG tablet Take 1 tablet (25 mg total) by mouth 2 (two) times daily with a meal. 02/04/14  Yes Buriev, Arie Sabina, MD  Cholecalciferol (VITAMIN D3) 50000 units TABS Take 500,000 Units by mouth 2 (two) times a week.   Yes [provider]  Cyanocobalamin (B-12 IJ) Inject 1,000 mcg as directed every 30 (thirty) days.   Yes [provider]  cyclobenzaprine (FLEXERIL) 5 MG tablet Take 1-2 tablets 3 times daily as needed 07/04/17  Yes Laban Emperor, PA-C  gabapentin (NEURONTIN) 300 MG capsule Take 600 mg by mouth 3 (three) times daily.    Yes [provider]  insulin aspart (NOVOLOG FLEXPEN) 100 UNIT/ML FlexPen Inject 10-20 Units into the skin 3 (three) times daily with meals. Sliding scale 02/04/14  Yes Buriev, Arie Sabina, MD  lidocaine (LIDODERM) 5 % Place 1 patch onto the skin every 12 (twelve) hours. Remove & Discard patch within 12 hours or as directed by MD Patient taking differently: Place 1 patch onto the skin as needed. Remove & Discard patch within 12 hours or  as directed by MD 07/04/17 07/04/18 Yes Laban Emperor, PA-C  lisinopril (PRINIVIL,ZESTRIL) 10 MG tablet Take 10 mg by mouth at bedtime. Reported on 10/19/2015   Yes [provider]  nitroGLYCERIN (NITROSTAT) 0.4 MG SL tablet Place 0.4 mg under the tongue every 5 (five) minutes as needed for chest pain.   Yes [provider]  oxyCODONE (ROXICODONE) 15 MG immediate release tablet Limit 1/2-1 tab by mouth 4-7 times per day if tolerated 04/19/16  Yes Mohammed Kindle, MD  rOPINIRole (REQUIP) 0.25 MG tablet Take 0.25 mg by mouth 2 (two) times daily.   Yes [provider]  tiZANidine (ZANAFLEX) 4 MG tablet Limit 1 tab by mouth per day or 2-3 times per day if tolerated 04/19/16  Yes Mohammed Kindle, MD  topiramate (TOPAMAX) 100 MG tablet Take 100 mg by mouth at bedtime. Take with 25 mg tablet for a 125 mg dose   Yes [provider]  traZODone (DESYREL) 50 MG tablet Take 50 mg  by mouth as needed. 11/26/17  Yes [provider]  cloNIDine (CATAPRES) 0.2 MG tablet Take 0.2 mg by mouth 2 (two) times daily. 02/10/16   [provider]  meclizine (ANTIVERT) 25 MG tablet Take 1 tablet (25 mg total) by mouth 3 (three) times daily as needed for dizziness. 12/12/17   Tashonna Descoteaux, Dellis Filbert, PA-C  meloxicam (MOBIC) 15 MG tablet Take 1 tablet (15 mg total) by mouth daily. Patient not taking: Reported on 12/12/2017 11/26/17   Cuthriell, Charline Bills, PA-C    Family History Family History  Problem Relation Age of Onset  . Depression Mother   . Drug abuse Mother   . Early death Mother   . Hypertension Mother   . Varicose Veins Mother   . Diabetes Father   . Early death Father   . Hyperlipidemia Father   . Heart Problems Father        enlarged geart  . Breast cancer Maternal Grandmother        60's  . Breast cancer Maternal Aunt     Social History Social History   Tobacco Use  . Smoking status: Never Smoker  . Smokeless tobacco: Never Used  Substance Use Topics  . Alcohol use: No  . Drug use: No     Allergies   Other; Bactrim [sulfamethoxazole-trimethoprim]; Ciprofloxacin; Rocephin [ceftriaxone sodium in dextrose]; Vancomycin; Amoxicillin; Ceftriaxone; Azithromycin; Fish oil; and Food   Review of Systems Review of Systems  All other systems reviewed and are negative.    Physical Exam Updated Vital Signs BP (!) 142/97 (BP Location: Right Arm)   Pulse 87   Temp 99 F (37.2 C) (Oral)   Resp 14   Ht 5\' 5"  (1.651 m)   Wt 76.7 kg (169 lb)   LMP 12/21/2014 (Approximate)   SpO2 100%   BMI 28.12 kg/m   Physical Exam  Constitutional: She is oriented to person, place, and time. She appears well-developed and well-nourished.  HENT:  Head: Normocephalic and atraumatic.  Eyes: Pupils are equal, round, and reactive to light. Conjunctivae and EOM are normal. Right eye exhibits no discharge. Left eye exhibits no discharge. No scleral icterus.  Neck:  Normal range of motion. Neck supple. No JVD present. No tracheal deviation present.  Cardiovascular: Normal rate, regular rhythm, normal heart sounds and intact distal pulses.  Pulmonary/Chest: Effort normal and breath sounds normal. No stridor. No respiratory distress. She has no wheezes. She has no rales. She exhibits no tenderness.  Neurological: She is alert and  oriented to person, place, and time. She has normal strength. No cranial nerve deficit or sensory deficit. Coordination normal. GCS eye subscore is 4. GCS verbal subscore is 5. GCS motor subscore is 6.  Psychiatric: She has a normal mood and affect. Her behavior is normal. Judgment and thought content normal.  Nursing note and vitals reviewed.    ED Treatments / Results  Labs (all labs ordered are listed, but only abnormal results are displayed) Labs Reviewed  BASIC METABOLIC PANEL - Abnormal; Notable for the following components:      Result Value   Glucose, Bld 108 (*)    All other components within normal limits  CBC  URINALYSIS, ROUTINE W REFLEX MICROSCOPIC  I-STAT BETA HCG BLOOD, ED ([facility], [facility], [facility] ONLY)  I-STAT TROPONIN, ED    EKG None  Radiology Ct Head Wo Contrast  Result Date: 12/12/2017 CLINICAL DATA:  42 year old female with a history of headache and dizziness EXAM: CT HEAD WITHOUT CONTRAST TECHNIQUE: Contiguous axial images were obtained from the base of the skull through the vertex without intravenous contrast. COMPARISON:  09/13/2015 FINDINGS: Brain: No acute intracranial hemorrhage. No midline shift or mass effect. Gray-white differentiation maintained. Unremarkable appearance of the ventricular system. Vascular: Unremarkable. Skull: No acute fracture.  No aggressive bone lesion identified. Sinuses/Orbits: Unremarkable appearance of the orbits. Mastoid air cells clear. No middle ear effusion. No significant sinus disease. Other: None IMPRESSION: Negative head CT Electronically Signed   By: Corrie Mckusick D.O.   On:  12/12/2017 20:49    Procedures Procedures (including critical care time)  Medications Ordered in ED Medications  sodium chloride 0.9 % bolus 1,000 mL (0 mLs Intravenous Stopped 12/12/17 1923)  ketorolac (TORADOL) 30 MG/ML injection 30 mg (30 mg Intravenous Given 12/12/17 1812)  metoCLOPramide (REGLAN) tablet 5 mg (5 mg Oral Given 12/12/17 1812)  diphenhydrAMINE (BENADRYL) capsule 25 mg (25 mg Oral Given 12/12/17 1812)  meclizine (ANTIVERT) tablet 25 mg (25 mg Oral Given 12/12/17 1903)     Initial Impression / Assessment and Plan / ED Course  I have reviewed the triage vital signs and the nursing notes.  Pertinent labs & imaging results that were available during my care of the patient were reviewed by me and considered in my medical decision making (see chart for details).      Final Clinical Impressions(s) / ED Diagnoses   Final diagnoses:  Acute nonintractable headache, unspecified headache type  Peripheral vertigo, unspecified laterality   Labs: Urinalysis, i-STAT beta-hCG, i-STAT troponin, BMP, CBC  Imaging: ED EKG  Consults:  Therapeutics: Benadryl, Toradol, Antivert, Reglan, normal saline  Discharge Meds: Antivert  Assessment/Plan: 42 year old female presents today with complaints of headache and dizziness.  Patient reports dizziness preceding the headache, this appears to be peripheral vertigo.  This is significantly worsened with head movements, standing.  She notes improvement with closing her eyes, she has no other acute neurological deficits and a has a normal head CT here.  I do have very low suspicion for central cause at this time.  Patient denies any episodes of dizziness in the past, chart review shows she has had several CT scans with chief complaint of severe dizziness.  Patient responded very well to meclizine here.  Her headache is less severe than her normal headaches, no red flags.  I have low suspicion for any infectious etiology.  Patient much improved and ready  for discharge.  Patient will follow-up as an outpatient with neurology if symptoms persist return immediately if they worsen  or develop any new or worsening signs or symptoms.  Patient verbalized understanding and agreement to today's plan had no further questions or concerns at the time of discharge.      ED Discharge Orders        Ordered    meclizine (ANTIVERT) 25 MG tablet  3 times daily PRN     12/12/17 2111       Okey Regal, PA-C 12/12/17 2131    Fatima Blank, MD 12/14/17 1011

## 2017-12-12 NOTE — ED Triage Notes (Signed)
Pt endorses dizziness since Monday evening worse with position change. Sent by pcp. No neuro deficits. VSS

## 2017-12-13 DIAGNOSIS — Z09 Encounter for follow-up examination after completed treatment for conditions other than malignant neoplasm: Secondary | ICD-10-CM | POA: Diagnosis not present

## 2017-12-13 DIAGNOSIS — R42 Dizziness and giddiness: Secondary | ICD-10-CM | POA: Diagnosis not present

## 2017-12-14 DIAGNOSIS — E1142 Type 2 diabetes mellitus with diabetic polyneuropathy: Secondary | ICD-10-CM | POA: Diagnosis not present

## 2017-12-14 DIAGNOSIS — E559 Vitamin D deficiency, unspecified: Secondary | ICD-10-CM | POA: Diagnosis not present

## 2017-12-14 DIAGNOSIS — D510 Vitamin B12 deficiency anemia due to intrinsic factor deficiency: Secondary | ICD-10-CM | POA: Diagnosis not present

## 2017-12-14 DIAGNOSIS — Z794 Long term (current) use of insulin: Secondary | ICD-10-CM | POA: Diagnosis not present

## 2017-12-17 DIAGNOSIS — R102 Pelvic and perineal pain: Secondary | ICD-10-CM | POA: Diagnosis not present

## 2018-01-15 ENCOUNTER — Other Ambulatory Visit: Payer: Self-pay | Admitting: Pain Medicine

## 2018-01-15 DIAGNOSIS — S46002S Unspecified injury of muscle(s) and tendon(s) of the rotator cuff of left shoulder, sequela: Secondary | ICD-10-CM | POA: Diagnosis not present

## 2018-01-15 DIAGNOSIS — M7542 Impingement syndrome of left shoulder: Secondary | ICD-10-CM

## 2018-01-15 DIAGNOSIS — Z5181 Encounter for therapeutic drug level monitoring: Secondary | ICD-10-CM | POA: Diagnosis not present

## 2018-01-15 DIAGNOSIS — M19012 Primary osteoarthritis, left shoulder: Secondary | ICD-10-CM | POA: Diagnosis not present

## 2018-01-23 ENCOUNTER — Ambulatory Visit
Admission: RE | Admit: 2018-01-23 | Discharge: 2018-01-23 | Disposition: A | Discharge status: Home / Self Care | Payer: PPO | Source: Ambulatory Visit | Attending: Pain Medicine | Admitting: Pain Medicine

## 2018-01-23 DIAGNOSIS — M7542 Impingement syndrome of left shoulder: Secondary | ICD-10-CM

## 2018-01-23 DIAGNOSIS — M19012 Primary osteoarthritis, left shoulder: Secondary | ICD-10-CM | POA: Diagnosis not present

## 2018-02-07 DIAGNOSIS — E119 Type 2 diabetes mellitus without complications: Secondary | ICD-10-CM | POA: Diagnosis not present

## 2018-02-07 DIAGNOSIS — Z794 Long term (current) use of insulin: Secondary | ICD-10-CM | POA: Diagnosis not present

## 2018-02-07 DIAGNOSIS — M7542 Impingement syndrome of left shoulder: Secondary | ICD-10-CM | POA: Diagnosis not present

## 2018-02-07 DIAGNOSIS — G2581 Restless legs syndrome: Secondary | ICD-10-CM | POA: Diagnosis not present

## 2018-02-07 DIAGNOSIS — E1140 Type 2 diabetes mellitus with diabetic neuropathy, unspecified: Secondary | ICD-10-CM | POA: Diagnosis not present

## 2018-02-08 DIAGNOSIS — G479 Sleep disorder, unspecified: Secondary | ICD-10-CM | POA: Diagnosis not present

## 2018-02-08 DIAGNOSIS — R252 Cramp and spasm: Secondary | ICD-10-CM | POA: Diagnosis not present

## 2018-02-08 DIAGNOSIS — R51 Headache: Secondary | ICD-10-CM | POA: Diagnosis not present

## 2018-02-08 DIAGNOSIS — G5603 Carpal tunnel syndrome, bilateral upper limbs: Secondary | ICD-10-CM | POA: Diagnosis not present

## 2018-02-08 DIAGNOSIS — R5383 Other fatigue: Secondary | ICD-10-CM | POA: Diagnosis not present

## 2018-02-08 DIAGNOSIS — G2581 Restless legs syndrome: Secondary | ICD-10-CM | POA: Diagnosis not present

## 2018-02-13 DIAGNOSIS — M19012 Primary osteoarthritis, left shoulder: Secondary | ICD-10-CM | POA: Diagnosis not present

## 2018-02-13 DIAGNOSIS — M7542 Impingement syndrome of left shoulder: Secondary | ICD-10-CM | POA: Diagnosis not present

## 2018-02-13 DIAGNOSIS — Z5181 Encounter for therapeutic drug level monitoring: Secondary | ICD-10-CM | POA: Diagnosis not present

## 2018-02-13 DIAGNOSIS — S46002S Unspecified injury of muscle(s) and tendon(s) of the rotator cuff of left shoulder, sequela: Secondary | ICD-10-CM | POA: Diagnosis not present

## 2018-02-25 DIAGNOSIS — M533 Sacrococcygeal disorders, not elsewhere classified: Secondary | ICD-10-CM | POA: Diagnosis not present

## 2018-02-27 ENCOUNTER — Other Ambulatory Visit: Payer: Self-pay

## 2018-02-27 ENCOUNTER — Encounter
Admission: RE | Admit: 2018-02-27 | Discharge: 2018-02-27 | Disposition: A | Discharge status: Home / Self Care | Payer: PPO | Source: Ambulatory Visit | Attending: Obstetrics and Gynecology | Admitting: Obstetrics and Gynecology

## 2018-02-27 DIAGNOSIS — M19012 Primary osteoarthritis, left shoulder: Secondary | ICD-10-CM | POA: Diagnosis not present

## 2018-02-27 DIAGNOSIS — I10 Essential (primary) hypertension: Secondary | ICD-10-CM | POA: Diagnosis not present

## 2018-02-27 DIAGNOSIS — E119 Type 2 diabetes mellitus without complications: Secondary | ICD-10-CM | POA: Diagnosis not present

## 2018-02-27 DIAGNOSIS — N898 Other specified noninflammatory disorders of vagina: Secondary | ICD-10-CM | POA: Diagnosis not present

## 2018-02-27 DIAGNOSIS — Z01818 Encounter for other preprocedural examination: Secondary | ICD-10-CM | POA: Insufficient documentation

## 2018-02-27 LAB — BASIC METABOLIC PANEL
ANION GAP: 10 (ref 5–15)
BUN: 19 mg/dL (ref 6–20)
CHLORIDE: 105 mmol/L (ref 98–111)
CO2: 26 mmol/L (ref 22–32)
CREATININE: 0.74 mg/dL (ref 0.44–1.00)
Calcium: 9.6 mg/dL (ref 8.9–10.3)
GFR calc Af Amer: >60 mL/min (ref >60)
GFR calc non Af Amer: >60 mL/min (ref >60)
Glucose, Bld: 119 mg/dL — ABNORMAL HIGH (ref 70–99)
Potassium: 4.2 mmol/L (ref 3.5–5.1)
SODIUM: 141 mmol/L (ref 135–145)

## 2018-02-27 LAB — TYPE AND SCREEN
ABO/RH(D): B POS
Antibody Screen: NEGATIVE

## 2018-02-27 LAB — CBC
HCT: 38.9 % (ref 35.0–47.0)
Hemoglobin: 12.9 g/dL (ref 12.0–16.0)
MCH: 29.6 pg (ref 26.0–34.0)
MCHC: 33 g/dL (ref 32.0–36.0)
MCV: 89.5 fL (ref 80.0–100.0)
PLATELETS: 136 10*3/uL — AB (ref 150–440)
RBC: 4.35 MIL/uL (ref 3.80–5.20)
RDW: 13.9 % (ref 11.5–14.5)
WBC: 4.2 10*3/uL (ref 3.6–11.0)

## 2018-02-27 NOTE — H&P (Signed)
Patient ID: Marissa [name] is a 42 y.o. female presenting with Pre Op Consulting  on 02/27/2018  HPI: Hx of chronic pelvic pain, multiple conservative interventions unsuccessful.  Hx of BSO x2, with open surgeries for each   Ut wnl Anteflexed shifted to lt Endometrium=1.95 mm Both ovs out  Her uterus is significantly anteflexed to the left.  Vaginal dryness, not improved by vaginal estrogen. Painful intercourse.  2 prior laparotomies, one for each ovary. She had a vertical incision in 2001 and a transverse incision in 1997.  Her pap is up to date. EMBx not required with no postmenopausal bleeding and a <12m endometrial stripe.  Workup:  Pap: 4/19: neg  Past Medical History:  has a past medical history of Antibiotic long-term use, Cervical high risk HPV (human papillomavirus) test positive, Depression (01/13/2014), Diabetes type 2, controlled (CMS-HCC) (01/13/2014), Difficulty speaking (01/13/2014), Headache (01/13/2014), History of mammogram (04/24/13), Hypertension (01/13/2014), Nonischemic cardiomyopathy (CMS-HCC), Osteoarthritis, Papanicolaou smear (03/27/12), and Vertigo.  Past Surgical History:  has a past surgical history that includes other surgery (05/2010 and 04/2013, 10/2013); Oophorectomy (Bilateral); Lumbar surgery x 3; C-Spine surgery (2006); Back surgery (2011, 2014, 10/2013, 01/2014); Back surgeryX3 (June/July 2015); Cholecystectomy (04/08/15); Colposcopy (08/05/2014); RIGHT SHOULDER ARTHROSCOPY (Right, 03/24/2016); and GASTRIC SLEEVE (05/31/2017). Family History: family history includes Diabetes in her father and paternal grandfather; Diabetes type II in her father; Heart disease in her father; High blood pressure (Hypertension) in her mother; Migraines in her mother; No Known Problems in her sister and sister; Obesity in her father; Other in her brother; Stroke in her father. Social History:  reports that she has never smoked. She has never used smokeless tobacco. She reports  that she does not drink alcohol or use drugs. OB/GYN History:          OB History    Gravida  0   Para  0   Term  0   Preterm  0   AB  0   Living  0     SAB  0   TAB  0   Ectopic  0   Molar      Multiple  0   Live Births             Allergies: is allergic to amoxicillin; azasite [azithromycin]; cefotaxime; ciprofloxacin; erythromycin base; nsaids (non-steroidal anti-inflammatory drug); rocephin [ceftriaxone]; sulfamethoxazole-trimethoprim; vancomycin; other; and yogurt. Medications:  Current Outpatient Medications:  .  amitriptyline (ELAVIL) 50 MG tablet, Take 50 mg by mouth nightly., Disp: , Rfl:  .  atorvastatin (LIPITOR) 40 MG tablet, Take 1 tablet by mouth each night at bedtime, Disp: 30 tablet, Rfl: 5 .  biotin 10 mg Tab, Take 10 mg by mouth once daily, Disp: , Rfl:  .  blood glucose diagnostic (ONETOUCH ULTRA BLUE TEST STRIP) test strip, Use 2 (two) times daily, Disp: 200 strip, Rfl: 1 .  blood glucose diagnostic test strip, Use 3 (three) times daily Use to check blood sugars 3 times daily as directed.Dispence  One touch test strips, Disp: 100 each, Rfl: 12 .  butalbital-acetaminophen-caffeine (FIORICET) 50-325-40 mg tablet, Take 1 tablet by mouth once daily as needed  , Disp: , Rfl:  .  carvedilol (COREG) 25 MG tablet, Take 25 mg by mouth 2 (two) times daily. , Disp: , Rfl: 0 .  cyanocobalamin, vitamin B-12, 1,000 mcg/mL Kit, Inject 1,000 mcg as directed monthly Diagnosis: D51.0, Disp: 3 kit, Rfl: 3 .  ergocalciferol, vitamin D2, 50,000 unit capsule, Take 1 capsule (50,000 Units total)  by mouth twice a week Diagnosis: E55.9, Disp: 24 capsule, Rfl: 3 .  gabapentin (NEURONTIN) 300 MG capsule, Take 2 pills at night in the morning, 2 pills in the afternoon, and 4 pills at night, Disp: 240 capsule, Rfl: 5 .  insulin ASPART (NOVOLOG FLEXPEN U-100 INSULIN) pen injector (concentration 100 units/mL), USE 1 UNIT PER 10 GRAMS OF CARBS FOR A TOTAL OF 25 UNITS PER DAY,  Disp: 15 Syringe, Rfl: 3 .  lisinopril (PRINIVIL,ZESTRIL) 40 MG tablet, Take 40 mg by mouth once daily. , Disp: 30 tablet, Rfl: 1 .  naloxone (NARCAN) 4 mg/actuation, Narcan 4 mg/actuation nasal spray- uses as directed, Disp: , Rfl:  .  nitroGLYcerin (NITROSTAT) 0.4 MG SL tablet, Place 0.4 mg under the tongue every 5 (five) minutes as needed for Chest pain. May take up to 3 doses., Disp: , Rfl:  .  oxyCODONE (ROXICODONE) 15 MG immediate release tablet, Take 15 mg by mouth every 6 (six) hours as needed for Pain., Disp: , Rfl:  .  pen needle, diabetic 32 gauge x 5/16" needle, Use as directed Use twice a day with diabetes medication pens, Disp: 200 each, Rfl: 3 .  rOPINIRole (REQUIP) 0.5 MG tablet, Take 0.5 mg in the morning and 1.5 mg at night, Disp: 120 tablet, Rfl: 1 .  tiZANidine (ZANAFLEX) 4 MG tablet, LIMIT 1 TAB BY MOUTH PER DAY OR 2-3 TIMES PER DAY IF TOLERATED, Disp: , Rfl: 0 .  topiramate (TOPAMAX) 50 MG tablet, Take 1 tab in the morning and 2 tabs at night, Disp: 90 tablet, Rfl: 3 .  traZODone (DESYREL) 50 MG tablet, Take 50-100 mg by mouth nightly, Disp: , Rfl:    Review of Systems: No SOB, no palpitations or chest pain, no new lower extremity edema, no nausea or vomiting or bowel or bladder complaints. See HPI for gyn specific ROS.   Exam:   BP 123/84   Pulse 83   Ht 163.8 cm (5' 4.5")   Wt 68.9 kg (152 lb)   BMI 25.69 kg/m   General: Patient is well-groomed, well-nourished, appears stated age in no acute distress  HEENT: head is atraumatic and normocephalic, trachea is midline, neck is supple with no palpable nodules  CV: Regular rhythm and normal heart rate, no murmur  Pulm: Clear to auscultation throughout lung fields with no wheezing, crackles, or rhonchi. No increased work of breathing  Abdomen: soft , no mass, non-tender, no rebound tenderness, no hepatomegaly  Pelvic: deferred   Impression:   The encounter diagnosis was Preop  examination.    Plan:    Patient returns for a preoperative discussion regarding her plans to proceed with surgical treatment of her chronic pelvic pain.  She requests to just start with laparotomy, and I have consented her for TAH/BS and cystoscopy.   Because of her left-shifted uterus and prior open surgeries in her pelvis x2, I have quoted her a 50% chance of open surgery, and offered her again a second opinion with Minimally invasive gynecology surgery. She declines. We will proceed with TAH.  The patient and I discussed the technical aspects of the procedure including the potential for risks and complications. These include but are not limited to the risk of infection requiring post-operative antibiotics or further procedures. We talked about the risk of injury to adjacent organs including bladder, bowel, ureter, blood vessels or nerves. We talked about the need to convert to an open incision. We talked about the possible need for blood transfusion. We talked  aboutpostop complications [name] asthromboembolic or cardiopulmonary complications. All of her questions were answered.  Her preoperative exam was completed and the appropriate consents were signed. She is scheduled to undergo this procedure in the near future.  Specific Peri-operative Considerations:  - Consent: obtained today - Health Maintenance: up to date - Labs: CBC, CMP preoperatively - Studies: EKG, CXR preoperatively - Bowel Preparation: None required - Abx:  Ancef 2g - VTE ppx: SCDs perioperatively - Glucose Protocol: n/a - Beta-blockade: n/a

## 2018-02-27 NOTE — Patient Instructions (Signed)
  Your procedure is scheduled on: Friday March 08, 2018 Report to Same Day Surgery 2nd floor medical mall ([facility]-take elevator on left to 2nd floor.  Check in with surgery information desk.) To find out your arrival time please call [phone] between 1PM - 3PM on Thursday March 07, 2018  Remember: Instructions that are not followed completely may result in serious medical risk, up to and including death, or upon the discretion of your surgeon and anesthesiologist your surgery may need to be rescheduled.    _x___ 1. Do not eat food (mints, candies, chewing gum) after midnight the night before your procedure. You may drink water up to 2 hours before you are scheduled to arrive at the hospital for your procedure.  Do not drink anything within 2 hours of your scheduled arrival to the hospital.     __x__ 2. No Alcohol for 24 hours before or after surgery.   __x__3. No Smoking or e-cigarettes for 24 prior to surgery.  Do not use any chewable tobacco products for at least 6 hour prior to surgery   __x__ 4. Notify your doctor if there is any change in your medical condition (cold, fever, infections).   __x__ 5. On the morning of surgery brush your teeth with toothpaste and water.  You may rinse your mouth with mouth wash if you wish.  Do not swallow any toothpaste or mouthwash.   Do not wear jewelry, make-up, hairpins, clips or nail polish.  Do not wear lotions, powders, deodorant, or perfumes.   Do not shave 48 hours prior to surgery.   Do not bring valuables to the hospital.    [facility] is not responsible for any belongings or valuables.               Contacts, dentures or bridgework may not be worn into surgery.  Leave your suitcase in the car. After surgery it may be brought to your room.  For patients admitted to the hospital, discharge time is determined by your treatment team.  Please read over the following fact sheets that you were given:   [facility] Preparing for  Surgery and or MRSA Information   _x___ Take anti-hypertensive listed below, cardiac, seizure, asthma, anti-reflux and psychiatric medicines. These include:  1. Carvedilol/Coreg  2. Clonidine/Catapres  3. Gabapentin/Neurontin  4. Ropinirole/Requip  5. Tizanidine/Zanaflex  6. Topiramate/Topamax  7. Oxycodone/Roxicodone if needed  No Lisinopril/Prinivil on day of surgery.  You don't have to stop taking ahead of time.  _x___ Use CHG Soap or sage wipes as directed on instruction sheet    _x___ Take 1/2 of usual insulin dose the night before surgery and none on the morning of surgery.   _x___ Follow recommendations from Cardiologist, Pulmonologist or PCP regarding stopping Aspirin, Coumadin, Plavix ,Eliquis, Effient, or Pradaxa, and Pletal.  _x___ Stop Anti-inflammatories such as Advil, Aleve, Ibuprofen, Motrin, Naproxen, Naprosyn, Goodies powders or aspirin products. OK to take Tylenol and Celebrex.   _x___ Stop supplements until after surgery.  But may continue Vitamin D, Vitamin B, and multivitamin.

## 2018-03-07 MED ORDER — DEXTROSE 5 % IV SOLN: INTRAVENOUS | Status: DC

## 2018-03-07 MED ORDER — CLINDAMYCIN PHOSPHATE 900 MG/50ML IV SOLN
900.0000 mg | INTRAVENOUS | Status: AC
  Administered 2018-03-08: 900 mg via INTRAVENOUS

## 2018-03-07 MED ORDER — GENTAMICIN SULFATE 40 MG/ML IJ SOLN
5.0000 mg/kg | INTRAVENOUS | Status: AC
  Administered 2018-03-08: 350 mg via INTRAVENOUS
  Filled 2018-03-07: qty 8.75

## 2018-03-08 ENCOUNTER — Encounter: Payer: Self-pay | Admitting: Emergency Medicine

## 2018-03-08 ENCOUNTER — Encounter
Admission: RE | Disposition: A | Discharge status: Home / Self Care | Payer: Self-pay | Source: Home / Self Care | Attending: Obstetrics and Gynecology

## 2018-03-08 ENCOUNTER — Inpatient Hospital Stay
Admission: RE | Admit: 2018-03-08 | Discharge: 2018-03-13 | DRG: 329 | Disposition: A | Discharge status: Home / Self Care | Payer: PPO | Attending: Obstetrics and Gynecology | Admitting: Obstetrics and Gynecology

## 2018-03-08 ENCOUNTER — Other Ambulatory Visit: Payer: Self-pay

## 2018-03-08 ENCOUNTER — Ambulatory Visit: Payer: PPO | Admitting: Certified Registered"

## 2018-03-08 DIAGNOSIS — K3580 Unspecified acute appendicitis: Secondary | ICD-10-CM | POA: Diagnosis not present

## 2018-03-08 DIAGNOSIS — D649 Anemia, unspecified: Secondary | ICD-10-CM | POA: Diagnosis present

## 2018-03-08 DIAGNOSIS — K9172 Accidental puncture and laceration of a digestive system organ or structure during other procedure: Secondary | ICD-10-CM | POA: Diagnosis not present

## 2018-03-08 DIAGNOSIS — I509 Heart failure, unspecified: Secondary | ICD-10-CM | POA: Diagnosis not present

## 2018-03-08 DIAGNOSIS — Z9889 Other specified postprocedural states: Secondary | ICD-10-CM

## 2018-03-08 DIAGNOSIS — I252 Old myocardial infarction: Secondary | ICD-10-CM

## 2018-03-08 DIAGNOSIS — E785 Hyperlipidemia, unspecified: Secondary | ICD-10-CM | POA: Diagnosis present

## 2018-03-08 DIAGNOSIS — G8929 Other chronic pain: Secondary | ICD-10-CM | POA: Diagnosis present

## 2018-03-08 DIAGNOSIS — Z880 Allergy status to penicillin: Secondary | ICD-10-CM

## 2018-03-08 DIAGNOSIS — K668 Other specified disorders of peritoneum: Secondary | ICD-10-CM | POA: Diagnosis not present

## 2018-03-08 DIAGNOSIS — S36409A Unspecified injury of unspecified part of small intestine, initial encounter: Secondary | ICD-10-CM | POA: Diagnosis not present

## 2018-03-08 DIAGNOSIS — A419 Sepsis, unspecified organism: Secondary | ICD-10-CM | POA: Diagnosis not present

## 2018-03-08 DIAGNOSIS — N941 Unspecified dyspareunia: Secondary | ICD-10-CM | POA: Diagnosis present

## 2018-03-08 DIAGNOSIS — R571 Hypovolemic shock: Secondary | ICD-10-CM | POA: Diagnosis not present

## 2018-03-08 DIAGNOSIS — I110 Hypertensive heart disease with heart failure: Secondary | ICD-10-CM | POA: Diagnosis not present

## 2018-03-08 DIAGNOSIS — R102 Pelvic and perineal pain: Principal | ICD-10-CM | POA: Diagnosis present

## 2018-03-08 DIAGNOSIS — E1129 Type 2 diabetes mellitus with other diabetic kidney complication: Secondary | ICD-10-CM | POA: Diagnosis not present

## 2018-03-08 DIAGNOSIS — E1165 Type 2 diabetes mellitus with hyperglycemia: Secondary | ICD-10-CM | POA: Diagnosis present

## 2018-03-08 DIAGNOSIS — G4730 Sleep apnea, unspecified: Secondary | ICD-10-CM | POA: Diagnosis not present

## 2018-03-08 DIAGNOSIS — E1140 Type 2 diabetes mellitus with diabetic neuropathy, unspecified: Secondary | ICD-10-CM | POA: Diagnosis present

## 2018-03-08 DIAGNOSIS — N736 Female pelvic peritoneal adhesions (postinfective): Secondary | ICD-10-CM | POA: Diagnosis present

## 2018-03-08 DIAGNOSIS — K219 Gastro-esophageal reflux disease without esophagitis: Secondary | ICD-10-CM | POA: Diagnosis present

## 2018-03-08 DIAGNOSIS — R109 Unspecified abdominal pain: Secondary | ICD-10-CM | POA: Diagnosis not present

## 2018-03-08 DIAGNOSIS — Z794 Long term (current) use of insulin: Secondary | ICD-10-CM

## 2018-03-08 DIAGNOSIS — F418 Other specified anxiety disorders: Secondary | ICD-10-CM | POA: Diagnosis not present

## 2018-03-08 DIAGNOSIS — K660 Peritoneal adhesions (postprocedural) (postinfection): Secondary | ICD-10-CM | POA: Diagnosis not present

## 2018-03-08 DIAGNOSIS — I420 Dilated cardiomyopathy: Secondary | ICD-10-CM | POA: Diagnosis not present

## 2018-03-08 DIAGNOSIS — I2510 Atherosclerotic heart disease of native coronary artery without angina pectoris: Secondary | ICD-10-CM | POA: Diagnosis not present

## 2018-03-08 DIAGNOSIS — J9811 Atelectasis: Secondary | ICD-10-CM | POA: Diagnosis not present

## 2018-03-08 DIAGNOSIS — Z7689 Persons encountering health services in other specified circumstances: Secondary | ICD-10-CM

## 2018-03-08 DIAGNOSIS — K565 Intestinal adhesions [bands], unspecified as to partial versus complete obstruction: Secondary | ICD-10-CM | POA: Diagnosis not present

## 2018-03-08 DIAGNOSIS — J90 Pleural effusion, not elsewhere classified: Secondary | ICD-10-CM | POA: Diagnosis not present

## 2018-03-08 HISTORY — PX: APPENDECTOMY: SHX54

## 2018-03-08 HISTORY — PX: SMALL BOWEL REPAIR: SHX6447

## 2018-03-08 HISTORY — PX: BOWEL RESECTION: SHX1257

## 2018-03-08 HISTORY — PX: LYSIS OF ADHESION: SHX5961

## 2018-03-08 LAB — GLUCOSE, CAPILLARY
GLUCOSE-CAPILLARY: 133 mg/dL — AB (ref 70–99)
GLUCOSE-CAPILLARY: 227 mg/dL — AB (ref 70–99)
GLUCOSE-CAPILLARY: 208 mg/dL — AB (ref 70–99)

## 2018-03-08 LAB — ABO/RH: ABO/RH(D): B POS

## 2018-03-08 SURGERY — (RADIOFREQUENCY) ABLATION
Anesthesia: General | Site: Abdomen | Wound class: Clean Contaminated

## 2018-03-08 MED ORDER — BUPIVACAINE LIPOSOME 1.3 % IJ SUSP
INTRAMUSCULAR | Status: AC
  Filled 2018-03-08: qty 20

## 2018-03-08 MED ORDER — TOPIRAMATE 100 MG PO TABS
50.0000 mg | ORAL_TABLET | Freq: Every morning | ORAL | Status: DC
  Filled 2018-03-08: qty 0.5
  Filled 2018-03-08: qty 2
  Filled 2018-03-08: qty 0.5
  Filled 2018-03-08: qty 2
  Filled 2018-03-08 (×2): qty 0.5

## 2018-03-08 MED ORDER — FAMOTIDINE 20 MG PO TABS
20.0000 mg | ORAL_TABLET | Freq: Once | ORAL | Status: AC
  Administered 2018-03-08: 20 mg via ORAL

## 2018-03-08 MED ORDER — HYDROMORPHONE 1 MG/ML IV SOLN
INTRAVENOUS | Status: DC
  Administered 2018-03-08: 25 mg via INTRAVENOUS
  Administered 2018-03-08: 0.8 mg via INTRAVENOUS
  Filled 2018-03-08: qty 25

## 2018-03-08 MED ORDER — VITAMIN D (ERGOCALCIFEROL) 1.25 MG (50000 UNIT) PO CAPS
50000.0000 [IU] | ORAL_CAPSULE | ORAL | Status: DC
  Administered 2018-03-11: 50000 [IU] via ORAL
  Filled 2018-03-08: qty 1

## 2018-03-08 MED ORDER — NALOXONE HCL 0.4 MG/ML IJ SOLN
0.4000 mg | INTRAMUSCULAR | Status: DC | PRN
  Filled 2018-03-08: qty 1

## 2018-03-08 MED ORDER — ROPINIROLE HCL 0.25 MG PO TABS: 0.5000 mg | ORAL_TABLET | ORAL | Status: DC

## 2018-03-08 MED ORDER — ENOXAPARIN SODIUM 30 MG/0.3ML ~~LOC~~ SOLN: 30.0000 mg | SUBCUTANEOUS | Status: DC

## 2018-03-08 MED ORDER — PROPOFOL 10 MG/ML IV BOLUS
INTRAVENOUS | Status: DC | PRN
  Administered 2018-03-08: 20 mg via INTRAVENOUS
  Administered 2018-03-08: 150 mg via INTRAVENOUS
  Administered 2018-03-08: 20 mg via INTRAVENOUS

## 2018-03-08 MED ORDER — BUPIVACAINE HCL 0.5 % IJ SOLN
INTRAMUSCULAR | Status: DC | PRN
  Administered 2018-03-08: 24 mL
  Administered 2018-03-08: 6 mL

## 2018-03-08 MED ORDER — SODIUM CHLORIDE 0.9 % IV SOLN
INTRAVENOUS | Status: DC | PRN
  Administered 2018-03-08: 20 ug/min via INTRAVENOUS

## 2018-03-08 MED ORDER — ROCURONIUM BROMIDE 100 MG/10ML IV SOLN
INTRAVENOUS | Status: DC | PRN
  Administered 2018-03-08: 20 mg via INTRAVENOUS
  Administered 2018-03-08: 10 mg via INTRAVENOUS
  Administered 2018-03-08: 50 mg via INTRAVENOUS
  Administered 2018-03-08 (×2): 10 mg via INTRAVENOUS

## 2018-03-08 MED ORDER — DEXAMETHASONE SODIUM PHOSPHATE 10 MG/ML IJ SOLN
INTRAMUSCULAR | Status: DC | PRN
  Administered 2018-03-08: 10 mg via INTRAVENOUS

## 2018-03-08 MED ORDER — OXYCODONE HCL 5 MG PO TABS
15.0000 mg | ORAL_TABLET | ORAL | Status: DC | PRN
  Administered 2018-03-08: 15 mg via ORAL
  Filled 2018-03-08: qty 3

## 2018-03-08 MED ORDER — BIOTIN 5 MG PO CAPS: ORAL_CAPSULE | Freq: Every day | ORAL | Status: DC

## 2018-03-08 MED ORDER — CETIRIZINE HCL 10 MG PO TABS
10.0000 mg | ORAL_TABLET | Freq: Every day | ORAL | Status: DC | PRN
  Filled 2018-03-08: qty 1

## 2018-03-08 MED ORDER — SUGAMMADEX SODIUM 200 MG/2ML IV SOLN
INTRAVENOUS | Status: AC
  Filled 2018-03-08: qty 2

## 2018-03-08 MED ORDER — FENTANYL CITRATE (PF) 100 MCG/2ML IJ SOLN
INTRAMUSCULAR | Status: AC
  Filled 2018-03-08: qty 2

## 2018-03-08 MED ORDER — FAMOTIDINE 20 MG PO TABS
ORAL_TABLET | ORAL | Status: AC
  Administered 2018-03-08: 20 mg via ORAL
  Filled 2018-03-08: qty 1

## 2018-03-08 MED ORDER — ONDANSETRON HCL 4 MG/2ML IJ SOLN: 4.0000 mg | Freq: Once | INTRAMUSCULAR | Status: DC | PRN

## 2018-03-08 MED ORDER — CELECOXIB 200 MG PO CAPS
400.0000 mg | ORAL_CAPSULE | ORAL | Status: AC
  Administered 2018-03-08: 400 mg via ORAL

## 2018-03-08 MED ORDER — AMITRIPTYLINE HCL 50 MG PO TABS
50.0000 mg | ORAL_TABLET | Freq: Every day | ORAL | Status: DC
  Administered 2018-03-11: 50 mg via ORAL
  Filled 2018-03-08 (×5): qty 1

## 2018-03-08 MED ORDER — ONDANSETRON HCL 4 MG/2ML IJ SOLN
INTRAMUSCULAR | Status: DC | PRN
  Administered 2018-03-08 (×2): 4 mg via INTRAVENOUS

## 2018-03-08 MED ORDER — MECLIZINE HCL 25 MG PO TABS
25.0000 mg | ORAL_TABLET | Freq: Three times a day (TID) | ORAL | Status: DC | PRN
  Filled 2018-03-08: qty 1

## 2018-03-08 MED ORDER — ENOXAPARIN SODIUM 40 MG/0.4ML ~~LOC~~ SOLN
40.0000 mg | SUBCUTANEOUS | Status: DC
  Administered 2018-03-09 – 2018-03-13 (×4): 40 mg via SUBCUTANEOUS
  Filled 2018-03-08 (×5): qty 0.4

## 2018-03-08 MED ORDER — ONDANSETRON HCL 4 MG/2ML IJ SOLN
INTRAMUSCULAR | Status: AC
  Filled 2018-03-08: qty 2

## 2018-03-08 MED ORDER — PHENYLEPHRINE HCL 10 MG/ML IJ SOLN
INTRAMUSCULAR | Status: DC | PRN
  Administered 2018-03-08 (×2): 50 ug via INTRAVENOUS

## 2018-03-08 MED ORDER — TOPIRAMATE 25 MG PO TABS: 50.0000 mg | ORAL_TABLET | ORAL | Status: DC

## 2018-03-08 MED ORDER — LACTATED RINGERS IV SOLN
INTRAVENOUS | Status: DC
  Administered 2018-03-08 – 2018-03-13 (×12): via INTRAVENOUS

## 2018-03-08 MED ORDER — NITROGLYCERIN 0.4 MG SL SUBL
0.4000 mg | SUBLINGUAL_TABLET | SUBLINGUAL | Status: DC | PRN
  Filled 2018-03-08: qty 25

## 2018-03-08 MED ORDER — SUGAMMADEX SODIUM 200 MG/2ML IV SOLN
INTRAVENOUS | Status: DC | PRN
  Administered 2018-03-08: 139 mg via INTRAVENOUS

## 2018-03-08 MED ORDER — PROPOFOL 10 MG/ML IV BOLUS
INTRAVENOUS | Status: AC
  Filled 2018-03-08: qty 20

## 2018-03-08 MED ORDER — INSULIN ASPART 100 UNIT/ML ~~LOC~~ SOLN
0.0000 [IU] | Freq: Three times a day (TID) | SUBCUTANEOUS | Status: DC
  Administered 2018-03-09: 7 [IU] via SUBCUTANEOUS

## 2018-03-08 MED ORDER — IBUPROFEN 400 MG PO TABS: 600.0000 mg | ORAL_TABLET | Freq: Four times a day (QID) | ORAL | Status: DC | PRN

## 2018-03-08 MED ORDER — OXYCODONE HCL 5 MG PO TABS: 20.0000 mg | ORAL_TABLET | ORAL | Status: DC | PRN

## 2018-03-08 MED ORDER — SODIUM CHLORIDE 0.9 % IJ SOLN
INTRAMUSCULAR | Status: AC
  Filled 2018-03-08: qty 50

## 2018-03-08 MED ORDER — FENTANYL CITRATE (PF) 100 MCG/2ML IJ SOLN
50.0000 ug | Freq: Once | INTRAMUSCULAR | Status: AC
  Administered 2018-03-08: 50 ug via INTRAVENOUS

## 2018-03-08 MED ORDER — NALOXONE HCL 0.4 MG/ML IJ SOLN
0.4000 mg | INTRAMUSCULAR | Status: DC | PRN
  Administered 2018-03-08 – 2018-03-09 (×2): 0.4 mg via INTRAVENOUS

## 2018-03-08 MED ORDER — FENTANYL CITRATE (PF) 100 MCG/2ML IJ SOLN
25.0000 ug | INTRAMUSCULAR | Status: AC | PRN
  Administered 2018-03-08 (×6): 25 ug via INTRAVENOUS

## 2018-03-08 MED ORDER — LIDOCAINE HCL (CARDIAC) PF 100 MG/5ML IV SOSY
PREFILLED_SYRINGE | INTRAVENOUS | Status: DC | PRN
  Administered 2018-03-08: 50 mg via INTRAVENOUS

## 2018-03-08 MED ORDER — ROCURONIUM BROMIDE 100 MG/10ML IV SOLN
INTRAVENOUS | Status: AC
  Filled 2018-03-08: qty 1

## 2018-03-08 MED ORDER — HYDROMORPHONE HCL 1 MG/ML IJ SOLN: 1.0000 mg | INTRAMUSCULAR | Status: DC | PRN

## 2018-03-08 MED ORDER — TIZANIDINE HCL 4 MG PO TABS
4.0000 mg | ORAL_TABLET | Freq: Three times a day (TID) | ORAL | Status: DC
  Administered 2018-03-08 – 2018-03-12 (×9): 4 mg via ORAL
  Filled 2018-03-08 (×15): qty 1

## 2018-03-08 MED ORDER — FENTANYL CITRATE (PF) 250 MCG/5ML IJ SOLN
INTRAMUSCULAR | Status: AC
  Filled 2018-03-08: qty 5

## 2018-03-08 MED ORDER — CELECOXIB 200 MG PO CAPS
ORAL_CAPSULE | ORAL | Status: AC
  Administered 2018-03-08: 400 mg via ORAL
  Filled 2018-03-08: qty 2

## 2018-03-08 MED ORDER — TRAZODONE HCL 50 MG PO TABS
50.0000 mg | ORAL_TABLET | Freq: Every day | ORAL | Status: DC
  Administered 2018-03-08 – 2018-03-12 (×3): 50 mg via ORAL
  Filled 2018-03-08 (×5): qty 1

## 2018-03-08 MED ORDER — LISINOPRIL 20 MG PO TABS
40.0000 mg | ORAL_TABLET | Freq: Every day | ORAL | Status: DC
  Filled 2018-03-08 (×4): qty 2

## 2018-03-08 MED ORDER — ACETAMINOPHEN 500 MG PO TABS
1000.0000 mg | ORAL_TABLET | Freq: Four times a day (QID) | ORAL | Status: DC
  Administered 2018-03-08: 1000 mg via ORAL
  Filled 2018-03-08: qty 2

## 2018-03-08 MED ORDER — ROPINIROLE HCL 0.25 MG PO TABS
0.5000 mg | ORAL_TABLET | Freq: Every morning | ORAL | Status: DC
  Administered 2018-03-09 – 2018-03-12 (×4): 0.5 mg via ORAL
  Filled 2018-03-08 (×2): qty 2
  Filled 2018-03-08: qty 1
  Filled 2018-03-08 (×4): qty 2

## 2018-03-08 MED ORDER — GABAPENTIN 300 MG PO CAPS
ORAL_CAPSULE | ORAL | Status: AC
  Administered 2018-03-08: 900 mg via ORAL
  Filled 2018-03-08: qty 3

## 2018-03-08 MED ORDER — SODIUM CHLORIDE 0.9% FLUSH: 9.0000 mL | INTRAVENOUS | Status: DC | PRN

## 2018-03-08 MED ORDER — PHENYLEPHRINE HCL 10 MG/ML IJ SOLN
INTRAMUSCULAR | Status: DC | PRN
  Administered 2018-03-08 (×3): 100 ug via INTRAVENOUS
  Administered 2018-03-08: 200 ug via INTRAVENOUS
  Administered 2018-03-08 (×3): 100 ug via INTRAVENOUS

## 2018-03-08 MED ORDER — ACETAMINOPHEN 500 MG PO TABS
ORAL_TABLET | ORAL | Status: AC
  Administered 2018-03-08: 1000 mg via ORAL
  Filled 2018-03-08: qty 2

## 2018-03-08 MED ORDER — GABAPENTIN 300 MG PO CAPS
900.0000 mg | ORAL_CAPSULE | ORAL | Status: AC
  Administered 2018-03-08: 900 mg via ORAL

## 2018-03-08 MED ORDER — KETAMINE HCL 10 MG/ML IJ SOLN
INTRAMUSCULAR | Status: DC | PRN
  Administered 2018-03-08: 30 mg via INTRAVENOUS
  Administered 2018-03-08: 20 mg via INTRAVENOUS

## 2018-03-08 MED ORDER — GABAPENTIN 400 MG PO CAPS
1200.0000 mg | ORAL_CAPSULE | Freq: Every day | ORAL | Status: DC
  Administered 2018-03-08 – 2018-03-09 (×2): 1200 mg via ORAL
  Filled 2018-03-08: qty 3
  Filled 2018-03-08: qty 4
  Filled 2018-03-08: qty 3

## 2018-03-08 MED ORDER — MENTHOL 3 MG MT LOZG
1.0000 | LOZENGE | OROMUCOSAL | Status: DC | PRN
  Filled 2018-03-08 (×2): qty 9

## 2018-03-08 MED ORDER — FENTANYL CITRATE (PF) 100 MCG/2ML IJ SOLN
INTRAMUSCULAR | Status: AC
  Administered 2018-03-08: 25 ug via INTRAVENOUS
  Filled 2018-03-08: qty 2

## 2018-03-08 MED ORDER — GABAPENTIN 300 MG PO CAPS
600.0000 mg | ORAL_CAPSULE | ORAL | Status: DC
Start: 2018-03-08 — End: 2018-03-08

## 2018-03-08 MED ORDER — BUPIVACAINE HCL (PF) 0.5 % IJ SOLN
INTRAMUSCULAR | Status: AC
  Filled 2018-03-08: qty 30

## 2018-03-08 MED ORDER — OXYCODONE HCL 5 MG PO TABS: 15.0000 mg | ORAL_TABLET | ORAL | Status: DC | PRN

## 2018-03-08 MED ORDER — EPHEDRINE SULFATE 50 MG/ML IJ SOLN
INTRAMUSCULAR | Status: DC | PRN
  Administered 2018-03-08: 10 mg via INTRAVENOUS

## 2018-03-08 MED ORDER — FENTANYL CITRATE (PF) 100 MCG/2ML IJ SOLN
INTRAMUSCULAR | Status: DC | PRN
  Administered 2018-03-08 (×2): 50 ug via INTRAVENOUS
  Administered 2018-03-08: 25 ug via INTRAVENOUS
  Administered 2018-03-08: 50 ug via INTRAVENOUS
  Administered 2018-03-08: 100 ug via INTRAVENOUS
  Administered 2018-03-08 (×2): 25 ug via INTRAVENOUS
  Administered 2018-03-08: 100 ug via INTRAVENOUS
  Administered 2018-03-08: 25 ug via INTRAVENOUS
  Administered 2018-03-08 (×2): 50 ug via INTRAVENOUS

## 2018-03-08 MED ORDER — GLYCOPYRROLATE 0.2 MG/ML IJ SOLN
INTRAMUSCULAR | Status: DC | PRN
  Administered 2018-03-08: 0.1 mg via INTRAVENOUS

## 2018-03-08 MED ORDER — DIPHENHYDRAMINE HCL 50 MG/ML IJ SOLN: 12.5000 mg | Freq: Four times a day (QID) | INTRAMUSCULAR | Status: DC | PRN

## 2018-03-08 MED ORDER — DOCUSATE SODIUM 100 MG PO CAPS
100.0000 mg | ORAL_CAPSULE | Freq: Two times a day (BID) | ORAL | Status: DC
  Administered 2018-03-08 – 2018-03-12 (×9): 100 mg via ORAL
  Filled 2018-03-08 (×9): qty 1

## 2018-03-08 MED ORDER — CYANOCOBALAMIN 1000 MCG/ML IJ SOLN
1000.0000 ug | INTRAMUSCULAR | Status: DC
Start: 2018-04-07 — End: 2018-03-13

## 2018-03-08 MED ORDER — SODIUM CHLORIDE 0.9 % IV SOLN
INTRAVENOUS | Status: DC | PRN
  Administered 2018-03-08: 70 mL

## 2018-03-08 MED ORDER — SEVOFLURANE IN SOLN
RESPIRATORY_TRACT | Status: AC
  Filled 2018-03-08: qty 250

## 2018-03-08 MED ORDER — SODIUM CHLORIDE 0.9 % IV SOLN
INTRAVENOUS | Status: DC | PRN
  Administered 2018-03-08 (×2): via INTRAVENOUS

## 2018-03-08 MED ORDER — CLINDAMYCIN PHOSPHATE 900 MG/50ML IV SOLN
INTRAVENOUS | Status: AC
Start: 2018-03-08 — End: 2018-03-08
  Filled 2018-03-08: qty 50

## 2018-03-08 MED ORDER — MIDAZOLAM HCL 5 MG/5ML IJ SOLN
INTRAMUSCULAR | Status: AC
  Filled 2018-03-08: qty 5

## 2018-03-08 MED ORDER — GABAPENTIN 300 MG PO CAPS
600.0000 mg | ORAL_CAPSULE | Freq: Two times a day (BID) | ORAL | Status: DC
  Administered 2018-03-09 – 2018-03-10 (×2): 600 mg via ORAL
  Filled 2018-03-08 (×2): qty 2

## 2018-03-08 MED ORDER — ROPINIROLE HCL 1 MG PO TABS
1.5000 mg | ORAL_TABLET | Freq: Every day | ORAL | Status: DC
  Administered 2018-03-08: 1.5 mg via ORAL
  Administered 2018-03-11: 1 mg via ORAL
  Administered 2018-03-12: 1.5 mg via ORAL
  Filled 2018-03-08 (×5): qty 2

## 2018-03-08 MED ORDER — SEVOFLURANE IN SOLN
RESPIRATORY_TRACT | Status: AC
Start: 2018-03-08 — End: ?
  Filled 2018-03-08: qty 250

## 2018-03-08 MED ORDER — ONDANSETRON HCL 4 MG/2ML IJ SOLN
4.0000 mg | Freq: Four times a day (QID) | INTRAMUSCULAR | Status: DC | PRN
  Administered 2018-03-10: 4 mg via INTRAVENOUS
  Filled 2018-03-08: qty 2

## 2018-03-08 MED ORDER — BUTALBITAL-APAP-CAFFEINE 50-325-40 MG PO TABS
1.0000 | ORAL_TABLET | Freq: Two times a day (BID) | ORAL | Status: DC | PRN
  Filled 2018-03-08: qty 1

## 2018-03-08 MED ORDER — KETOROLAC TROMETHAMINE 30 MG/ML IJ SOLN
30.0000 mg | Freq: Once | INTRAMUSCULAR | Status: AC
  Administered 2018-03-08: 30 mg via INTRAVENOUS

## 2018-03-08 MED ORDER — LACTATED RINGERS IV SOLN: INTRAVENOUS | Status: DC

## 2018-03-08 MED ORDER — CARVEDILOL 25 MG PO TABS
25.0000 mg | ORAL_TABLET | Freq: Two times a day (BID) | ORAL | Status: DC
  Administered 2018-03-10 – 2018-03-11 (×3): 25 mg via ORAL
  Filled 2018-03-08 (×3): qty 1
  Filled 2018-03-08: qty 2
  Filled 2018-03-08 (×3): qty 1
  Filled 2018-03-08: qty 2

## 2018-03-08 MED ORDER — ATORVASTATIN CALCIUM 20 MG PO TABS
40.0000 mg | ORAL_TABLET | Freq: Every day | ORAL | Status: DC
  Administered 2018-03-08 – 2018-03-12 (×5): 40 mg via ORAL
  Filled 2018-03-08 (×6): qty 2

## 2018-03-08 MED ORDER — MIDAZOLAM HCL 2 MG/2ML IJ SOLN
INTRAMUSCULAR | Status: DC | PRN
  Administered 2018-03-08: 3 mg via INTRAVENOUS
  Administered 2018-03-08: 2 mg via INTRAVENOUS

## 2018-03-08 MED ORDER — DIPHENHYDRAMINE HCL 12.5 MG/5ML PO ELIX
12.5000 mg | ORAL_SOLUTION | Freq: Four times a day (QID) | ORAL | Status: DC | PRN
  Filled 2018-03-08: qty 5

## 2018-03-08 MED ORDER — ACETAMINOPHEN 500 MG PO TABS
1000.0000 mg | ORAL_TABLET | ORAL | Status: AC
  Administered 2018-03-08: 1000 mg via ORAL

## 2018-03-08 MED ORDER — KETOROLAC TROMETHAMINE 30 MG/ML IJ SOLN
INTRAMUSCULAR | Status: AC
  Administered 2018-03-08: 30 mg via INTRAVENOUS
  Filled 2018-03-08: qty 1

## 2018-03-08 MED ORDER — CLONIDINE HCL 0.1 MG PO TABS
0.2000 mg | ORAL_TABLET | Freq: Two times a day (BID) | ORAL | Status: DC
  Administered 2018-03-11: 0.2 mg via ORAL
  Filled 2018-03-08 (×8): qty 2

## 2018-03-08 MED ORDER — TOPIRAMATE 100 MG PO TABS
100.0000 mg | ORAL_TABLET | Freq: Every day | ORAL | Status: DC
  Administered 2018-03-12: 100 mg via ORAL
  Filled 2018-03-08 (×5): qty 1

## 2018-03-08 SURGICAL SUPPLY — 95 items
BAG URINE DRAINAGE (UROLOGICAL SUPPLIES) ×4 IMPLANT
BLADE SURG SZ11 CARB STEEL (BLADE) ×4 IMPLANT
CANISTER SUCT 1200ML W/VALVE (MISCELLANEOUS) ×4 IMPLANT
CATH FOLEY 2WAY  5CC 16FR (CATHETERS)
CATH ROBINSON RED A/P 16FR (CATHETERS) IMPLANT
CATH URTH 16FR FL 2W BLN LF (CATHETERS) IMPLANT
CHLORAPREP W/TINT 26ML (MISCELLANEOUS) ×4 IMPLANT
CNTNR SPEC 2.5X3XGRAD LEK (MISCELLANEOUS) ×3
CONT SPEC 4OZ STER OR WHT (MISCELLANEOUS) ×1
CONTAINER SPEC 2.5X3XGRAD LEK (MISCELLANEOUS) ×3 IMPLANT
CORD MONOPOLAR M/FML 12FT (MISCELLANEOUS) ×4 IMPLANT
COUNTER NEEDLE 20/40 LG (NEEDLE) IMPLANT
COVER LIGHT HANDLE STERIS (MISCELLANEOUS) ×8 IMPLANT
DERMABOND ADVANCED (GAUZE/BANDAGES/DRESSINGS) ×1
DERMABOND ADVANCED .7 DNX12 (GAUZE/BANDAGES/DRESSINGS) ×3 IMPLANT
DEVICE SUTURE ENDOST 10MM (ENDOMECHANICALS) IMPLANT
DRAPE LAP W/FLUID (DRAPES) ×4 IMPLANT
DRAPE STERI POUCH LG 24X46 STR (DRAPES) ×4 IMPLANT
DRAPE UNDER BUTTOCK W/FLU (DRAPES) ×4 IMPLANT
DRSG OPSITE POSTOP 4X8 (GAUZE/BANDAGES/DRESSINGS) ×4 IMPLANT
DRSG TEGADERM 2-3/8X2-3/4 SM (GAUZE/BANDAGES/DRESSINGS) IMPLANT
DRSG TELFA 3X8 NADH (GAUZE/BANDAGES/DRESSINGS) IMPLANT
ELECT BLADE 6.5 EXT (BLADE) ×4 IMPLANT
ELECT CAUTERY BLADE 6.4 (BLADE) ×8 IMPLANT
ELECT REM PT RETURN 9FT ADLT (ELECTROSURGICAL) ×4
ELECTRODE REM PT RTRN 9FT ADLT (ELECTROSURGICAL) ×3 IMPLANT
GAUZE SPONGE 4X4 12PLY STRL (GAUZE/BANDAGES/DRESSINGS) ×4 IMPLANT
GLOVE BIO SURGEON STRL SZ7 (GLOVE) ×16 IMPLANT
GLOVE INDICATOR 7.5 STRL GRN (GLOVE) ×16 IMPLANT
GOWN STRL REUS W/ TWL LRG LVL3 (GOWN DISPOSABLE) ×12 IMPLANT
GOWN STRL REUS W/ TWL XL LVL3 (GOWN DISPOSABLE) ×12 IMPLANT
GOWN STRL REUS W/TWL LRG LVL3 (GOWN DISPOSABLE) ×4
GOWN STRL REUS W/TWL XL LVL3 (GOWN DISPOSABLE) ×4
GRASPER SUT TROCAR 14GX15 (MISCELLANEOUS) IMPLANT
HANDLE YANKAUER SUCT BULB TIP (MISCELLANEOUS) IMPLANT
IRRIGATION STRYKERFLOW (MISCELLANEOUS) IMPLANT
IRRIGATOR STRYKERFLOW (MISCELLANEOUS)
IV LACTATED RINGERS 1000ML (IV SOLUTION) ×4 IMPLANT
KIT PINK PAD W/HEAD ARE REST (MISCELLANEOUS) ×4
KIT PINK PAD W/HEAD ARM REST (MISCELLANEOUS) ×3 IMPLANT
KIT TURNOVER CYSTO (KITS) ×4 IMPLANT
LABEL OR SOLS (LABEL) ×4 IMPLANT
LIGASURE IMPACT 36 18CM CVD LR (INSTRUMENTS) ×4 IMPLANT
MANIPULATOR VCARE LG CRV RETR (MISCELLANEOUS) IMPLANT
MANIPULATOR VCARE SML CRV RETR (MISCELLANEOUS) IMPLANT
MANIPULATOR VCARE STD CRV RETR (MISCELLANEOUS) ×4 IMPLANT
NEEDLE HYPO 25X1 1.5 SAFETY (NEEDLE) ×4 IMPLANT
NS IRRIG 500ML POUR BTL (IV SOLUTION) ×4 IMPLANT
OCCLUDER COLPOPNEUMO (BALLOONS) ×4 IMPLANT
PACK BASIN MAJOR ARMC (MISCELLANEOUS) ×4 IMPLANT
PACK GYN LAPAROSCOPIC (MISCELLANEOUS) ×4 IMPLANT
PAD ABD DERMACEA PRESS 5X9 (GAUZE/BANDAGES/DRESSINGS) ×4 IMPLANT
PAD OB MATERNITY 4.3X12.25 (PERSONAL CARE ITEMS) ×4 IMPLANT
PAD PREP 24X41 OB/GYN DISP (PERSONAL CARE ITEMS) ×4 IMPLANT
PENCIL ELECTRO HAND CTR (MISCELLANEOUS) IMPLANT
RELOAD PROXIMATE 75MM BLUE (ENDOMECHANICALS) ×16 IMPLANT
RETRACTOR WND ALEXIS-O 25 LRG (MISCELLANEOUS) ×3 IMPLANT
RTRCTR WOUND ALEXIS O 25CM LRG (MISCELLANEOUS) ×4
SCISSORS METZENBAUM CVD 33 (INSTRUMENTS) IMPLANT
SET CYSTO W/LG BORE CLAMP LF (SET/KITS/TRAYS/PACK) ×4 IMPLANT
SLEEVE ENDOPATH XCEL 5M (ENDOMECHANICALS) ×4 IMPLANT
SOL PREP PVP 2OZ (MISCELLANEOUS)
SOLUTION PREP PVP 2OZ (MISCELLANEOUS) IMPLANT
SPONGE GAUZE 2X2 8PLY STRL LF (GAUZE/BANDAGES/DRESSINGS) IMPLANT
SPONGE KITTNER 5P (MISCELLANEOUS) ×4 IMPLANT
SPONGE XRAY 4X4 16PLY STRL (MISCELLANEOUS) ×4 IMPLANT
STAPLER INSORB 30 2030 C-SECTI (MISCELLANEOUS) ×4 IMPLANT
STAPLER PROXIMATE 75MM BLUE (STAPLE) ×4 IMPLANT
STAPLER SKIN PROX 35W (STAPLE) IMPLANT
SURGILUBE 2OZ TUBE FLIPTOP (MISCELLANEOUS) ×4 IMPLANT
SUT CHROMIC 0 CT 1 (SUTURE) ×8 IMPLANT
SUT ENDO VLOC 180-0-8IN (SUTURE) IMPLANT
SUT MNCRL 4-0 (SUTURE) ×1
SUT MNCRL 4-0 27XMFL (SUTURE) ×3
SUT PDS AB 1 TP1 96 (SUTURE) ×4 IMPLANT
SUT SILK 2 0 SH (SUTURE) ×4 IMPLANT
SUT SILK 2 0 SH CR/8 (SUTURE) ×8 IMPLANT
SUT VIC AB 0 CT1 27 (SUTURE) ×1
SUT VIC AB 0 CT1 27XCR 8 STRN (SUTURE) ×3 IMPLANT
SUT VIC AB 0 CT1 36 (SUTURE) ×4 IMPLANT
SUT VIC AB 2-0 SH 27 (SUTURE) ×1
SUT VIC AB 2-0 SH 27XBRD (SUTURE) ×3 IMPLANT
SUT VIC AB 3-0 SH 27 (SUTURE) ×1
SUT VIC AB 3-0 SH 27X BRD (SUTURE) ×3 IMPLANT
SUT VICRYL PLUS ABS 0 54 (SUTURE) ×4 IMPLANT
SUTURE MNCRL 4-0 27XMF (SUTURE) ×3 IMPLANT
SYR 10ML LL (SYRINGE) ×4 IMPLANT
SYR 20CC LL (SYRINGE) ×4 IMPLANT
SYR 50ML LL SCALE MARK (SYRINGE) ×4 IMPLANT
SYR BULB IRRIG 60ML STRL (SYRINGE) IMPLANT
TRAY FOLEY MTR SLVR 16FR STAT (SET/KITS/TRAYS/PACK) ×4 IMPLANT
TROCAR ENDO BLADELESS 11MM (ENDOMECHANICALS) IMPLANT
TROCAR XCEL NON-BLD 5MMX100MML (ENDOMECHANICALS) ×4 IMPLANT
TUBING INSUF HEATED (TUBING) IMPLANT
WATER STERILE IRR 1000ML POUR (IV SOLUTION) ×4 IMPLANT

## 2018-03-08 NOTE — Progress Notes (Signed)
   03/08/18 2322  Clinical Encounter Type  Visited With Patient not available;Health care provider  Visit Type Code (rapid response)  Spiritual Encounters  Spiritual Needs Prayer   Rapid response: chaplain maintained pastoral presence, offering silent and energetic prayers for patient and care team.

## 2018-03-08 NOTE — Progress Notes (Signed)
PHARMACIST - PHYSICIAN ORDER COMMUNICATION  CONCERNING: P&T Medication Policy on Herbal Medications  DESCRIPTION:  This patient's order for:  Biotin  has been noted.  This product(s) is classified as an "herbal" or natural product. Due to a lack of definitive safety studies or FDA approval, nonstandard manufacturing practices, plus the potential risk of unknown drug-drug interactions while on inpatient medications, the Pharmacy and Therapeutics Committee does not permit the use of "herbal" or natural products of this type within [place].   ACTION TAKEN: The pharmacy department is unable to verify this order at this time Please reevaluate patient's clinical condition at discharge and address if the herbal or natural product(s) should be resumed at that time.    

## 2018-03-08 NOTE — Interval H&P Note (Signed)
History and Physical Interval Note:  03/08/2018 1:30 PM  Marissa [name]  has presented today for surgery, with the diagnosis of pelvic pain  The various methods of treatment have been discussed with the patient and family. After consideration of risks, benefits and other options for treatment, the patient has consented to  Procedure(s):  HYSTERECTOMY TOTAL LAPAROSCOPIC (N/A) , possible cystoscopy, Possible HYSTERECTOMY ABDOMINAL WITH SALPINGECTOMY (Bilateral)as a surgical intervention .  The patient's history has been reviewed, patient examined, no change in status, stable for surgery.  I have reviewed the patient's chart and labs.  Questions were answered to the patient's satisfaction.     Benjaman Kindler

## 2018-03-08 NOTE — Anesthesia Procedure Notes (Signed)
Procedure Name: Intubation Date/Time: 03/08/2018 2:21 PM Performed by: Nile Riggs, CRNA Pre-anesthesia Checklist: Patient identified, Emergency Drugs available, Suction available, Patient being monitored and Timeout performed Patient Re-evaluated:Patient Re-evaluated prior to induction Oxygen Delivery Method: Circle system utilized Preoxygenation: Pre-oxygenation with 100% oxygen Induction Type: IV induction Ventilation: Mask ventilation without difficulty Laryngoscope Size: Miller and 2 Grade View: Grade I Tube type: Oral Tube size: 7.0 mm Number of attempts: 1 Placement Confirmation: ETT inserted through vocal cords under direct vision,  positive ETCO2,  CO2 detector and breath sounds checked- equal and bilateral Secured at: 21 cm Tube secured with: Tape Dental Injury: Teeth and Oropharynx as per pre-operative assessment

## 2018-03-08 NOTE — Op Note (Signed)
Marissa [name] PROCEDURE DATE: 03/08/2018  PREOPERATIVE DIAGNOSIS:  Chronic pelvic pain POSTOPERATIVE DIAGNOSIS:   Chronic pelvic pain Extensive adhesive disease Small bowel injury  SURGEON:   Benjaman Kindler, M.D. ASSISTANT: Larey Days, M.D.  INTRAOPERATIVE CONSULT to General Surgery, Dr. Herbert Pun, who performed all of the case except for the dx lap.  Procedure: Diagnostic laparoscopy Small bowel repair Small bowel resection with anastomosis Incidental appendectomy Lysis of adhesions by general surgery for the majority of the case  ANESTHESIA:  General endotracheal.  INDICATIONS: The patient is a 42 y.o. postmenopausal female with a hx of right and then left oophorectomies >12yrs ago through Pfannenstiel and midline vertical incisions. She has experienced chronic pelvic pain since, and requested surgical exploration with definitive surgical management. On the preoperative visit, the risks, benefits, indications, and alternatives of the procedure were reviewed with the patient.  On the day of surgery, the risks of surgery were again discussed with the patient including but not limited to: bleeding which may require transfusion or reoperation; infection which may require antibiotics; injury to bowel, bladder, ureters or other surrounding organs; need for additional procedures; thromboembolic phenomenon, incisional problems and other postoperative/anesthesia complications. Written informed consent was obtained.    OPERATIVE FINDINGS: On exam, a <6 week size uterus deviated to the left. On opening, extensive bowel adhesions to the anterior pelvic wall and intraabdominally. The uterus was deviated to the left and after medial mobilization of the sigmoid colon, the uterus (which was not seen because of the extensive adhesions which placed it retroperitoneally) was both inferior to and left lateral to the sigmoid colon. The bladder appeared normal, and once the colon was mobilized  it did as well.  ESTIMATED BLOOD LOSS: 100 ml FLUIDS:  1800 ml of Lactated Ringers URINE OUTPUT:  400 ml of clear yellow urine. SPECIMENS:  23ml of small bowel with an 8 cm serosal defect COMPLICATIONS:  None immediate.  DESCRIPTION OF PROCEDURE: The patient received intravenous antibiotics (gent and clinda) and had sequential compression devices applied to her lower extremities while in the preoperative area.   She was taken to the operating room and placed under general anesthesia without difficulty.  The abdomen and perineum were prepped and draped in a sterile manner, and she was placed in a dorsal Lithotomy position.  A Foley catheter was inserted into the bladder and attached to constant drainage. After an adequate timeout was performed, a small V-care uterine manipulator was placed in the uterus.  A 42mm incision was made infraumbilical, lateral to the prior incision. The peritoneal cavity was attempted to be entered using an Optiview 5-mm trocar and sleeve were then advanced with the laparoscope under direct visualization into the abdomen.  The opening pressure was noted to be 18, and the trocar pulled back. At this time, the decision was made to proceed with an open case, as bowel adhesions were suspected and indeed, bowel injury was noted on entry into the abdomen.  An infraumbilical vertical skin incision was made. This incision was taken down to the fascia using electrocautery with care given to maintain good hemostasis. The fascia was incised in the midline and the fascial incision was then extended superiorly and inferiorly using sharp and electrocautery without difficulty.  The rectus muscles were split bluntly in the midline and the peritoneum entered carefully in layers.. This peritoneal incision was then extended superiorly and inferiorly with care given to prevent bowel or bladder injury. The extent of the adhesions were noted, and the portion of  the bowel at the end of the trocar  examined, with a small defect initially noted.  However, it was clear that adhesionlysis and bowel repair would be required, and general surgery was called.  Please see Dr. Darrol Poke op note for the remainder of the case.   The fascia, rectus muscles and peritoneum were closed in a mass fashion using a looped 0 PDS suture in a running fashion by Dr. Peyton Najjar. The skin was closed with a 0 Vicryl absorbable staples. Sponge, lap, needle, and instrument counts were correct times two. The patient was taken to the recovery area awake, extubated and in stable condition. Her foley was left in place, and we plan for clear fluids tonight.  Of note, liposomal Exparel was used for pain control at the levels of the fascia and skin, using the standard mixture of 36ml exparel to 56ml of 0.5% bupivicaine to 10ml of injectable saline.

## 2018-03-08 NOTE — Anesthesia Post-op Follow-up Note (Signed)
Anesthesia QCDR form completed.        

## 2018-03-08 NOTE — Transfer of Care (Signed)
Immediate Anesthesia Transfer of Care Note  Patient: Marissa [name]  Procedure(s) Performed: HYSTERECTOMY TOTAL LAPAROSCOPIC (N/A Abdomen) APPENDECTOMY (N/A Abdomen) SMALL BOWEL REPAIR (N/A Abdomen) SMALL BOWEL RESECTION (N/A Abdomen) LYSIS OF ADHESION (N/A Abdomen)  Patient Location: PACU  Anesthesia Type:General  Level of Consciousness: awake, alert  and sedated  Airway & Oxygen Therapy: Patient Spontanous Breathing and Patient connected to face mask oxygen  Post-op Assessment: Report given to RN and Post -op Vital signs reviewed and stable  Post vital signs: Reviewed and stable  Last Vitals:  Vitals Value Taken Time  BP    Temp    Pulse    Resp    SpO2      Last Pain:  Vitals:   03/08/18 1227  TempSrc: Oral  PainSc: 7          Complications: No apparent anesthesia complications

## 2018-03-08 NOTE — Op Note (Signed)
Preoperative diagnosis: Hollow viscus organ injury.  Postoperative diagnosis:  Small bowel injury  Procedure: Small Bowel Resection with anastomosis                      Incidental appendectomy                      Extensive lysis of adhesions  Anesthesia: GETA  Surgeon: Dr. Windell Moment, MD  Assistant: Dr. Leafy Ro  Wound Classification: Clean contaminated  Indications: Patient is a 42 y.o. female  presented for hysterectomy due to chronic pelvic pain. Incidental bowel injury was identified and intra op consult was done. Upon exploration severe adhesions were found with a 8 cm long serosal tear of the ileum and appendix severely adhere.   Findings:  1. Severe adhesion of small bowel, large bowel and appendix on the pelvic area 2. 8 cm long serosal tear 3. Small bowel side to side anastomosis without tension 4. Adequate hemostasis.   Description of procedure: Infra umbilical midline incision already made by Gynecology team. Small bowel serosal tear was identified but proximal and distal small bowel was very adhere and unable to inspect completely. An extensive > 60 minutes of lysis of adhesions was done with Metzanbound scissors until the ileum was able to be freed and run from ileocecal valve to Ligament of treitz. During the lysis of adhesion due to severely adhere appendix it was needed to be resected. The base of the appendix was dissected. The mesoappendix was ligated with silk 0 sutures. The base of the appendix was crushed in a clamp and the clamp was then advanced 1 cm toward the tip of the appendix. The appendix was then ligated at the proximal edge of the crushed portion with a 0 chromic ligature. The appendix was held upward, cut distal to the ligature, and removed.  Hemostasis was checked.  The region of the serosal tear was identified and the extent of resection determined so as to achieve an adequate healthy margin.  A window was created by using a curved hemostat to separate  the mesentery from the bowel at each resection margin. The mesentery was scored and serially divided with LigaSure.  The bowel was divided with a cutting linear stapler at each resection margin and passed off the table as a specimen. The antimesenteric angles of the proximal and distal segments were then approximated with two sutures of 3-0 silk placed approximately 7 cm apart. Enterotomies were made at the antimesenteric borders and the cutting linear stapler inserted and fired. The lumen was inspected for hemostasis. The enterotomies were closed with a single firing of a linear stapler. The anastomosis was then inspected for patency and integrity. The mesenteric defect was closed with a running 3-0 silk suture. The remaining small bowel appeared viable. The abdominal cavity irrigated. Gynecology team decided to abort hysterectomy. With Loop PDS the midline fasica was closed. Gynecology team closed the subdermal tissue and skin.   The patient tolerated the procedure well and was taken to the postanesthesia care unit in satisfactory condition.   Specimen: Small bowel, Appendix  Complications: None

## 2018-03-08 NOTE — Anesthesia Preprocedure Evaluation (Signed)
Anesthesia Evaluation  Patient identified by MRN, date of birth, ID band Patient awake    Reviewed: Allergy & Precautions, NPO status , Patient's Chart, lab work & pertinent test results  History of Anesthesia Complications Negative for: history of anesthetic complications  Airway Mallampati: II  TM Distance: >3 FB Neck ROM: Full    Dental  (+) Partial Lower   Pulmonary sleep apnea (not using CPAP) ,           Cardiovascular hypertension, Pt. on medications and Pt. on home beta blockers + CAD, + Past MI and +CHF  + dysrhythmias (prolonged Q-T)      Neuro/Psych  Headaches, PSYCHIATRIC DISORDERS Anxiety Depression    GI/Hepatic GERD  ,  Endo/Other  diabetes, Type 2, Insulin Dependent  Renal/GU Renal disease (hx of ARF with sepsis)     Musculoskeletal   Abdominal   Peds  Hematology  (+) anemia ,   Anesthesia Other Findings   Reproductive/Obstetrics                             Anesthesia Physical  Anesthesia Plan  ASA: III  Anesthesia Plan: General   Post-op Pain Management:    Induction: Intravenous  PONV Risk Score and Plan:   Airway Management Planned: Oral ETT  Additional Equipment:   Intra-op Plan:   Post-operative Plan:   Informed Consent: I have reviewed the patients History and Physical, chart, labs and discussed the procedure including the risks, benefits and alternatives for the proposed anesthesia with the patient or authorized representative who has indicated his/her understanding and acceptance.     Plan Discussed with:   Anesthesia Plan Comments:         Anesthesia Quick Evaluation

## 2018-03-08 NOTE — OR Nursing (Signed)
Prior to surgery, patient informed OR nurse of right ear piercing, lip piercing, and chest dermal piercing.  Anesthesia and surgeon aware.  During skin prep a clitoral piercing was discovered.  Surgeon already aware and anesthesia made aware.  All described jewelry in this note have been left intact. 4 total piercings.

## 2018-03-09 ENCOUNTER — Inpatient Hospital Stay: Payer: Self-pay

## 2018-03-09 ENCOUNTER — Inpatient Hospital Stay: Payer: PPO

## 2018-03-09 ENCOUNTER — Other Ambulatory Visit: Payer: Self-pay

## 2018-03-09 DIAGNOSIS — R571 Hypovolemic shock: Secondary | ICD-10-CM

## 2018-03-09 LAB — CBC
HCT: 32.5 % — ABNORMAL LOW (ref 35.0–47.0)
HCT: 37 % (ref 35.0–47.0)
HEMOGLOBIN: 12.2 g/dL (ref 12.0–16.0)
Hemoglobin: 10.8 g/dL — ABNORMAL LOW (ref 12.0–16.0)
MCH: 29.8 pg (ref 26.0–34.0)
MCH: 30.3 pg (ref 26.0–34.0)
MCHC: 32.9 g/dL (ref 32.0–36.0)
MCHC: 33.3 g/dL (ref 32.0–36.0)
MCV: 91.1 fL (ref 80.0–100.0)
MCV: 90.8 fL (ref 80.0–100.0)
Platelets: 113 10*3/uL — ABNORMAL LOW (ref 150–440)
Platelets: 123 10*3/uL — ABNORMAL LOW (ref 150–440)
RBC: 3.57 MIL/uL — ABNORMAL LOW (ref 3.80–5.20)
RBC: 4.08 MIL/uL (ref 3.80–5.20)
RDW: 14.1 % (ref 11.5–14.5)
RDW: 14.3 % (ref 11.5–14.5)
WBC: 8.9 10*3/uL (ref 3.6–11.0)
WBC: 10 10*3/uL (ref 3.6–11.0)

## 2018-03-09 LAB — GLUCOSE, CAPILLARY
GLUCOSE-CAPILLARY: 269 mg/dL — AB (ref 70–99)
GLUCOSE-CAPILLARY: 198 mg/dL — AB (ref 70–99)
GLUCOSE-CAPILLARY: 229 mg/dL — AB (ref 70–99)
GLUCOSE-CAPILLARY: 122 mg/dL — AB (ref 70–99)
Glucose-Capillary: 302 mg/dL — ABNORMAL HIGH (ref 70–99)
Glucose-Capillary: 272 mg/dL — ABNORMAL HIGH (ref 70–99)
Glucose-Capillary: 195 mg/dL — ABNORMAL HIGH (ref 70–99)
Glucose-Capillary: 155 mg/dL — ABNORMAL HIGH (ref 70–99)
Glucose-Capillary: 160 mg/dL — ABNORMAL HIGH (ref 70–99)

## 2018-03-09 LAB — HEMOGLOBIN AND HEMATOCRIT, BLOOD
HCT: 33.3 % — ABNORMAL LOW (ref 35.0–47.0)
HCT: 31.8 % — ABNORMAL LOW (ref 35.0–47.0)
HEMATOCRIT: 38 % (ref 35.0–47.0)
Hemoglobin: 12.4 g/dL (ref 12.0–16.0)
Hemoglobin: 11.1 g/dL — ABNORMAL LOW (ref 12.0–16.0)
Hemoglobin: 10.5 g/dL — ABNORMAL LOW (ref 12.0–16.0)

## 2018-03-09 LAB — BASIC METABOLIC PANEL
ANION GAP: 6 (ref 5–15)
BUN: 19 mg/dL (ref 6–20)
CHLORIDE: 109 mmol/L (ref 98–111)
CO2: 24 mmol/L (ref 22–32)
Calcium: 8.1 mg/dL — ABNORMAL LOW (ref 8.9–10.3)
Creatinine, Ser: 0.87 mg/dL (ref 0.44–1.00)
GFR calc Af Amer: >60 mL/min (ref >60)
GFR calc non Af Amer: >60 mL/min (ref >60)
GLUCOSE: 312 mg/dL — AB (ref 70–99)
POTASSIUM: 4.5 mmol/L (ref 3.5–5.1)
Sodium: 139 mmol/L (ref 135–145)

## 2018-03-09 LAB — LACTIC ACID, PLASMA
LACTIC ACID, VENOUS: 2.1 mmol/L — AB (ref 0.5–1.9)
Lactic Acid, Venous: 2.5 mmol/L (ref 0.5–1.9)

## 2018-03-09 LAB — TROPONIN I
Troponin I: <0.03 ng/mL
Troponin I: <0.03 ng/mL (ref <0.03)
Troponin I: <0.03 ng/mL (ref <0.03)

## 2018-03-09 LAB — BLOOD GAS, ARTERIAL
Acid-base deficit: 4.3 mmol/L — ABNORMAL HIGH (ref 0.0–2.0)
Bicarbonate: 23.2 mmol/L (ref 20.0–28.0)
FIO2: 0.28
O2 Saturation: 96.9 %
Patient temperature: 37
pCO2 arterial: 53 mmHg — ABNORMAL HIGH (ref 32.0–48.0)
pH, Arterial: 7.25 — ABNORMAL LOW (ref 7.350–7.450)
pO2, Arterial: 103 mmHg (ref 83.0–108.0)

## 2018-03-09 LAB — MRSA PCR SCREENING: MRSA by PCR: NEGATIVE

## 2018-03-09 MED ORDER — BLISTEX MEDICATED EX OINT
TOPICAL_OINTMENT | CUTANEOUS | Status: DC | PRN
  Administered 2018-03-09: 18:00:00 via TOPICAL
  Filled 2018-03-09: qty 6.3

## 2018-03-09 MED ORDER — EPINEPHRINE PF 1 MG/10ML IJ SOSY
PREFILLED_SYRINGE | INTRAMUSCULAR | Status: AC
  Filled 2018-03-09: qty 10

## 2018-03-09 MED ORDER — PIPERACILLIN-TAZOBACTAM 3.375 G IVPB
3.3750 g | Freq: Three times a day (TID) | INTRAVENOUS | Status: DC
  Administered 2018-03-09 – 2018-03-13 (×11): 3.375 g via INTRAVENOUS
  Filled 2018-03-09 (×16): qty 50

## 2018-03-09 MED ORDER — PHENYLEPHRINE HCL 10 MG/ML IJ SOLN
0.0000 ug/min | Freq: Once | INTRAVENOUS | Status: DC
  Filled 2018-03-09: qty 1

## 2018-03-09 MED ORDER — METRONIDAZOLE IN NACL 5-0.79 MG/ML-% IV SOLN
500.0000 mg | Freq: Three times a day (TID) | INTRAVENOUS | Status: DC
  Administered 2018-03-09 (×2): 500 mg via INTRAVENOUS
  Filled 2018-03-09 (×5): qty 100

## 2018-03-09 MED ORDER — INSULIN GLARGINE 100 UNIT/ML ~~LOC~~ SOLN
5.0000 [IU] | Freq: Every day | SUBCUTANEOUS | Status: DC
  Administered 2018-03-09 – 2018-03-12 (×4): 5 [IU] via SUBCUTANEOUS
  Filled 2018-03-09 (×6): qty 0.05

## 2018-03-09 MED ORDER — AZTREONAM 1 G IJ SOLR
1.0000 g | Freq: Three times a day (TID) | INTRAMUSCULAR | Status: DC
  Filled 2018-03-09: qty 1

## 2018-03-09 MED ORDER — SODIUM CHLORIDE 0.9 % IV SOLN
INTRAVENOUS | Status: AC
  Administered 2018-03-09: 06:00:00 via INTRAVENOUS

## 2018-03-09 MED ORDER — SODIUM CHLORIDE 0.9 % IV SOLN
1.0000 g | Freq: Three times a day (TID) | INTRAVENOUS | Status: DC
  Administered 2018-03-09 (×2): 1 g via INTRAVENOUS
  Filled 2018-03-09 (×5): qty 1

## 2018-03-09 MED ORDER — KETOROLAC TROMETHAMINE 15 MG/ML IJ SOLN
15.0000 mg | Freq: Four times a day (QID) | INTRAMUSCULAR | Status: DC | PRN
  Administered 2018-03-09 – 2018-03-11 (×4): 15 mg via INTRAVENOUS
  Filled 2018-03-09 (×5): qty 1

## 2018-03-09 MED ORDER — ACETAMINOPHEN 10 MG/ML IV SOLN
1000.0000 mg | Freq: Four times a day (QID) | INTRAVENOUS | Status: AC
  Administered 2018-03-09 (×3): 1000 mg via INTRAVENOUS
  Filled 2018-03-09 (×4): qty 100

## 2018-03-09 MED ORDER — SODIUM CHLORIDE 0.9 % IV SOLN
INTRAVENOUS | Status: AC
  Administered 2018-03-09 (×2): via INTRAVENOUS

## 2018-03-09 MED ORDER — FENTANYL CITRATE (PF) 100 MCG/2ML IJ SOLN
12.5000 ug | INTRAMUSCULAR | Status: DC | PRN
  Administered 2018-03-09: 12.5 ug via INTRAVENOUS
  Administered 2018-03-10 (×3): 25 ug via INTRAVENOUS
  Filled 2018-03-09 (×4): qty 2

## 2018-03-09 MED ORDER — INSULIN ASPART 100 UNIT/ML ~~LOC~~ SOLN
0.0000 [IU] | SUBCUTANEOUS | Status: DC
  Administered 2018-03-09: 2 [IU] via SUBCUTANEOUS
  Administered 2018-03-09: 5 [IU] via SUBCUTANEOUS
  Administered 2018-03-09 (×2): 3 [IU] via SUBCUTANEOUS
  Administered 2018-03-10: 2 [IU] via SUBCUTANEOUS
  Administered 2018-03-11: 3 [IU] via SUBCUTANEOUS
  Administered 2018-03-11: 5 [IU] via SUBCUTANEOUS
  Administered 2018-03-11: 2 [IU] via SUBCUTANEOUS
  Administered 2018-03-12 (×2): 3 [IU] via SUBCUTANEOUS
  Administered 2018-03-13: 2 [IU] via SUBCUTANEOUS
  Filled 2018-03-09 (×14): qty 1

## 2018-03-09 MED ORDER — FENTANYL CITRATE (PF) 100 MCG/2ML IJ SOLN: 12.5000 ug | INTRAMUSCULAR | Status: DC | PRN

## 2018-03-09 MED ORDER — INSULIN ASPART 100 UNIT/ML ~~LOC~~ SOLN
SUBCUTANEOUS | Status: AC
  Filled 2018-03-09: qty 1

## 2018-03-09 MED ORDER — HYDROMORPHONE 1 MG/ML IV SOLN
INTRAVENOUS | Status: DC
  Administered 2018-03-09: 0 mg via INTRAVENOUS

## 2018-03-09 MED ORDER — PHENYLEPHRINE HCL-NACL 10-0.9 MG/250ML-% IV SOLN
0.0000 ug/min | INTRAVENOUS | Status: DC
  Administered 2018-03-09: 20 ug/min via INTRAVENOUS
  Administered 2018-03-09: 13 ug/min via INTRAVENOUS
  Administered 2018-03-09: 20 ug/min via INTRAVENOUS
  Administered 2018-03-09: 10 ug/min via INTRAVENOUS
  Administered 2018-03-09: 20 ug/min via INTRAVENOUS
  Administered 2018-03-10: 7 ug/min via INTRAVENOUS
  Administered 2018-03-10: 15 ug/min via INTRAVENOUS
  Filled 2018-03-09 (×2): qty 250

## 2018-03-09 NOTE — Progress Notes (Signed)
1 Day Post-Op       Procedure(s) with comments: HYSTERECTOMY TOTAL LAPAROSCOPIC (N/A) - attempted APPENDECTOMY (N/A) - By Dr. Windell Moment SMALL BOWEL REPAIR (N/A) SMALL BOWEL RESECTION (N/A) LYSIS OF ADHESION (N/A) Subjective: The patient is doing well.  No nausea or vomiting. Pain is poorly controlled, as home meds and postop meds are being held due to bradypnea and hypotension, both improved this morning. On neo. She is passing gas.  Objective: Vital signs in last 24 hours: Temp:  [97.3 F (36.3 C)-98.2 F (36.8 C)] 97.5 F (36.4 C) (07/27 0430) Pulse Rate:  [68-113] 68 (07/27 0545) Resp:  [0-22] 16 (07/27 0630) BP: (69-121)/(43-90) 96/60 (07/27 0630) SpO2:  [88 %-100 %] 100 % (07/27 0545) Weight:  [69.5 kg (153 lb 3.2 oz)] 69.5 kg (153 lb 3.2 oz) (07/26 1227)  Intake/Output  Intake/Output Summary (Last 24 hours) at 03/09/2018 0824 Last data filed at 03/09/2018 7026 Gross per 24 hour  Intake 5516.42 ml  Output 1000 ml  Net 4516.42 ml    Physical Exam:  General: Alert and oriented. CV: RRR Lungs: Clear bilaterally, limited by pain with diaphragmatic excursion GI: Soft, Nondistended.  Incision clean and intact, no new drainage this morning  Urine: Clear, Foley in place Extremities: Nontender, no erythema, no edema.  Lab Results: Recent Labs    03/08/18 2357 03/09/18 0423  HGB 12.2 10.8*  HCT 37.0 32.5*  WBC 10.0 8.9  PLT 123* 113*                 Results for orders placed or performed during the hospital encounter of 03/08/18 (from the past 24 hour(s))  Glucose, capillary     Status: Abnormal   Collection Time: 03/08/18 12:35 PM  Result Value Ref Range   Glucose-Capillary 133 (H) 70 - 99 mg/dL  ABO/Rh     Status: None   Collection Time: 03/08/18 12:38 PM  Result Value Ref Range   ABO/RH(D)      B POS Performed at [facility], [address] [place],  [address]   Glucose, capillary     Status: Abnormal   Collection Time: 03/08/18   6:32 PM  Result Value Ref Range   Glucose-Capillary 227 (H) 70 - 99 mg/dL  Glucose, capillary     Status: Abnormal   Collection Time: 03/08/18  8:23 PM  Result Value Ref Range   Glucose-Capillary 208 (H) 70 - 99 mg/dL   Comment 1 Notify RN    Comment 2 Document in Chart   CBC     Status: Abnormal   Collection Time: 03/08/18 11:57 PM  Result Value Ref Range   WBC 10.0 3.6 - 11.0 K/uL   RBC 4.08 3.80 - 5.20 MIL/uL   Hemoglobin 12.2 12.0 - 16.0 g/dL   HCT 37.0 35.0 - 47.0 %   MCV 90.8 80.0 - 100.0 fL   MCH 29.8 26.0 - 34.0 pg   MCHC 32.9 32.0 - 36.0 g/dL   RDW 14.3 11.5 - 14.5 %   Platelets 123 (L) 150 - 440 K/uL  Basic metabolic panel     Status: Abnormal   Collection Time: 03/08/18 11:57 PM  Result Value Ref Range   Sodium 139 135 - 145 mmol/L   Potassium 4.5 3.5 - 5.1 mmol/L   Chloride 109 98 - 111 mmol/L   CO2 24 22 - 32 mmol/L   Glucose, Bld 312 (H) 70 - 99 mg/dL   BUN 19 6 - 20 mg/dL   Creatinine,  Ser 0.87 0.44 - 1.00 mg/dL   Calcium 8.1 (L) 8.9 - 10.3 mg/dL   GFR calc non Af Amer >60 >60 mL/min   GFR calc Af Amer >60 >60 mL/min   Anion gap 6 5 - 15  Glucose, capillary     Status: Abnormal   Collection Time: 03/09/18  2:24 AM  Result Value Ref Range   Glucose-Capillary 302 (H) 70 - 99 mg/dL  Glucose, capillary     Status: Abnormal   Collection Time: 03/09/18  3:16 AM  Result Value Ref Range   Glucose-Capillary 272 (H) 70 - 99 mg/dL  Glucose, capillary     Status: Abnormal   Collection Time: 03/09/18  3:41 AM  Result Value Ref Range   Glucose-Capillary 269 (H) 70 - 99 mg/dL   Comment 1 Notify RN    Comment 2 Document in Chart   Blood gas, arterial     Status: Abnormal   Collection Time: 03/09/18  3:46 AM  Result Value Ref Range   FIO2 0.28    Delivery systems NO CHARGE    pH, Arterial 7.25 (L) 7.350 - 7.450   pCO2 arterial 53 (H) 32.0 - 48.0 mmHg   pO2, Arterial 103 83.0 - 108.0 mmHg   Bicarbonate 23.2 20.0 - 28.0 mmol/L   Acid-base deficit 4.3 (H) 0.0 -  2.0 mmol/L   O2 Saturation 96.9 %   Patient temperature 37.0    Collection site LEFT BRACHIAL    Sample type ARTERIAL DRAW   MRSA PCR Screening     Status: None   Collection Time: 03/09/18  3:51 AM  Result Value Ref Range   MRSA by PCR NEGATIVE NEGATIVE  CBC     Status: Abnormal   Collection Time: 03/09/18  4:23 AM  Result Value Ref Range   WBC 8.9 3.6 - 11.0 K/uL   RBC 3.57 (L) 3.80 - 5.20 MIL/uL   Hemoglobin 10.8 (L) 12.0 - 16.0 g/dL   HCT 32.5 (L) 35.0 - 47.0 %   MCV 91.1 80.0 - 100.0 fL   MCH 30.3 26.0 - 34.0 pg   MCHC 33.3 32.0 - 36.0 g/dL   RDW 14.1 11.5 - 14.5 %   Platelets 113 (L) 150 - 440 K/uL  Lactic acid, plasma     Status: Abnormal   Collection Time: 03/09/18  4:23 AM  Result Value Ref Range   Lactic Acid, Venous 2.5 (HH) 0.5 - 1.9 mmol/L  Glucose, capillary     Status: Abnormal   Collection Time: 03/09/18  6:43 AM  Result Value Ref Range   Glucose-Capillary 195 (H) 70 - 99 mg/dL   Comment 1 Notify RN    Comment 2 Document in Chart   Glucose, capillary     Status: Abnormal   Collection Time: 03/09/18  7:36 AM  Result Value Ref Range   Glucose-Capillary 198 (H) 70 - 99 mg/dL    Assessment/Plan: 1 Day Post-Op       Procedure(s) with comments: HYSTERECTOMY TOTAL LAPAROSCOPIC (N/A) - attempted APPENDECTOMY (N/A) - By Dr. Windell Moment SMALL BOWEL REPAIR (N/A) SMALL BOWEL RESECTION (N/A) LYSIS OF ADHESION (N/A)  1) Appreciate ICU attending care . Possible advance diet to clear liquid today, with oral care and gum PRN.  2) Continue abx for 24hrs and reassess, will allow gen surg to determine if otherwise clinically unchanged 3)Consider CT if worsening clinical status, concern for abdominal bleeding. 4) SCDs, initially would start lovenox 40mg  until fully ambulatory with Caprini score of 5, but  will hold for now. 5) Postop pain control with iv meds PRN. Consider scheduled 1000mg  tylenol q8, 800mg  ibuprofen q8 and gabapentin (is on high doses at home) with rescue  IV narcotics PRN. PO meds when tolerating.   Benjaman Kindler, MD   LOS: 1 day   Benjaman Kindler 03/09/2018, 8:24 AM

## 2018-03-09 NOTE — Consult Note (Signed)
PULMONARY / CRITICAL CARE MEDICINE   Name: Marissa [name] MRN: [id number] DOB: [birth year]    ADMISSION DATE:  03/08/2018   CONSULTATION DATE:  03/09/2018  REFERRING MD: Benjaman Kindler, MD    Reason: Hypovolemic shock  HISTORY OF PRESENT ILLNESS:   This is a 42 year old female who is postop day 1 following a hysterectomy, appendectomy, small bowel repair, small bowel resection and lysis of adhesions.  Postop, patient was doing well when this morning she became unresponsive and hypotensive after receiving oxycodone 15 mg at 10:10 PM, and started on a Dilaudid PCA.  She was given Narcan and a fluid bolus without significant improvement in blood pressure hence she was transferred to the ICU for further management.  Upon arriving in the ICU, patient was more alert and answers questions appropriately.  Her blood pressure was still low.  She was still receiving a second fluid bolus.  She denies any chest pain palpitations nausea or vomiting but reports severe abdominal pain.  Bedside ultrasound did not show any intraperitoneal bleeding.  Her repeat CBC shows a hemoglobin 10.8 down from 12.2 preop. Her blood sugar is still elevated.  She has a history of diabetes and takes insulin at home.  She is currently on a sensitive sliding scale  PAST MEDICAL HISTORY :  She  has a past medical history of Allergy, Anemia, Anxiety, Arthritis, Back pain, chronic, Cardiomyopathy, dilated, nonischemic (HCC), CHF (congestive heart failure) ([place]), Cholelithiasis, Coronary artery disease, Depression, Diabetes mellitus without complication ([place]), Dysrhythmia, GERD (gastroesophageal reflux disease), Headache(784.0), Hypercholesteremia, Hyperlipemia, Hypertension, Insulin pump in place, MI, old, Migraines, Myocardial infarction ([place]) (2006), Neck pain, chronic, Neuropathy, Pneumonia (2015), Prolonged QT interval syndrome, Renal insufficiency, Restless leg syndrome, controlled, Sepsis ([place]) (2016), Sleep apnea, and Vision  loss.  PAST SURGICAL HISTORY: She  has a past surgical history that includes Back surgery (2011); Cervical fusion (2006); Salpingoophorectomy (Bilateral); Cardiac catheterization (07/18/2004); Lumbar laminectomy/decompression microdiscectomy (Right, 05/07/2013); carpel tunnel (Bilateral); Lumbar laminectomy/decompression microdiscectomy (Left, 10/29/2013); Lumbar wound debridement (N/A, 01/25/2014); Lumbar wound debridement (N/A, 02/25/2014); Cholecystectomy (N/A, 04/08/2015); shoulder sugery; and Shoulder arthroscopy with bicepstenotomy (Left, 03/24/2016).  Allergies  Allergen Reactions  . Bactrim [Sulfamethoxazole-Trimethoprim] Nausea And Vomiting  . Ciprofloxacin Swelling  . Vancomycin Nausea And Vomiting  . Amoxicillin Nausea And Vomiting    Has patient had a PCN reaction causing immediate rash, facial/tongue/throat swelling, SOB or lightheadedness with hypotension: No Has patient had a PCN reaction causing severe rash involving mucus membranes or skin necrosis: No Has patient had a PCN reaction that required hospitalization: No Has patient had a PCN reaction occurring within the last 10 years: Yes If all of the above answers are "NO", then may proceed with Cephalosporin use.   . Ceftriaxone Nausea And Vomiting  . Azithromycin Itching and Rash  . Food Itching and Rash    "[place] yogurt"    No current facility-administered medications on file prior to encounter.    Current Outpatient Medications on File Prior to Encounter  Medication Sig  . acetaminophen (TYLENOL) 500 MG tablet Take 1,000 mg by mouth 2 (two) times daily as needed for moderate pain or headache.  Marland Kitchen amitriptyline (ELAVIL) 50 MG tablet Take 50 mg by mouth at bedtime.  Marland Kitchen atorvastatin (LIPITOR) 40 MG tablet Take 40 mg by mouth at bedtime.   Marland Kitchen BIOTIN PO Take 1 tablet by mouth daily.  . butalbital-acetaminophen-caffeine (FIORICET, ESGIC) 50-325-40 MG tablet Take 1 tablet by mouth 2 (two) times daily as needed for migraine.   .  carvedilol (  COREG) 25 MG tablet Take 1 tablet (25 mg total) by mouth 2 (two) times daily with a meal.  . Cholecalciferol (VITAMIN D3) 50000 units TABS Take 500,000 Units by mouth 2 (two) times a week.  . cloNIDine (CATAPRES) 0.2 MG tablet Take 0.2 mg by mouth 2 (two) times daily.  . cyanocobalamin (,VITAMIN B-12,) 1000 MCG/ML injection Inject 1,000 mcg into the muscle every 30 (thirty) days.  Marland Kitchen gabapentin (NEURONTIN) 300 MG capsule Take 600-1,200 mg by mouth See admin instructions. Take 600 mg in the morning and afternoon, then take 1200 mg at bedtime  . insulin aspart (NOVOLOG FLEXPEN) 100 UNIT/ML FlexPen Inject 10-20 Units into the skin 3 (three) times daily with meals. Sliding scale (Patient taking differently: Inject 2-6 Units into the skin daily with supper. Sliding scale)  . levocetirizine (XYZAL) 5 MG tablet Take 5 mg by mouth daily as needed for allergies.  Marland Kitchen lisinopril (PRINIVIL,ZESTRIL) 40 MG tablet Take 40 mg by mouth daily.  . meclizine (ANTIVERT) 25 MG tablet Take 1 tablet (25 mg total) by mouth 3 (three) times daily as needed for dizziness.  Marland Kitchen NARCAN 4 MG/0.1ML LIQD nasal spray kit CALL 911. INJECT SPRAY INTO NOSTRIL. IF NO RESPONSE AFTER 2 MIN REPEAT IN OPPOSITE NOSTRIL.  Marland Kitchen nitroGLYCERIN (NITROSTAT) 0.4 MG SL tablet Place 0.4 mg under the tongue every 5 (five) minutes as needed for chest pain.  Marland Kitchen OVER THE COUNTER MEDICATION Take 1 tablet by mouth daily. Mag O7 otc supplement  . oxycodone (ROXICODONE) 30 MG immediate release tablet Take 15 mg by mouth every 4 (four) hours as needed for pain.  Marland Kitchen rOPINIRole (REQUIP) 0.5 MG tablet Take 0.5-1.5 mg by mouth See admin instructions. Take 0.5 mg in the morning and 1.5 mg at night  . tiZANidine (ZANAFLEX) 4 MG tablet Limit 1 tab by mouth per day or 2-3 times per day if tolerated (Patient taking differently: Take 4 mg by mouth 3 (three) times daily. )  . topiramate (TOPAMAX) 50 MG tablet Take 50-100 mg by mouth See admin instructions. Take 50 mg  in the morning and 100 mg at night  . traZODone (DESYREL) 50 MG tablet Take 50 mg by mouth at bedtime.   . lidocaine (LIDODERM) 5 % Place 1 patch onto the skin every 12 (twelve) hours. Remove & Discard patch within 12 hours or as directed by MD (Patient not taking: Reported on 02/25/2018)  . meloxicam (MOBIC) 15 MG tablet Take 1 tablet (15 mg total) by mouth daily. (Patient not taking: Reported on 12/12/2017)    FAMILY HISTORY:  Her family history includes Breast cancer in her maternal aunt and maternal grandmother; Depression in her mother; Diabetes in her father; Drug abuse in her mother; Early death in her father and mother; Heart Problems in her father; Hyperlipidemia in her father; Hypertension in her mother; Varicose Veins in her mother.  SOCIAL HISTORY: She  reports that she has never smoked. She has never used smokeless tobacco. She reports that she does not drink alcohol or use drugs.  REVIEW OF SYSTEMS:   Constitutional: Negative for fever and chills.  HENT: Negative for congestion and rhinorrhea.  Eyes: Negative for redness and visual disturbance.  Respiratory: Negative for shortness of breath and wheezing.  Cardiovascular: Negative for chest pain and palpitations.  Gastrointestinal: Negative  for nausea , vomiting positive for abdominal pain along the surgical incision  Genitourinary: Negative for dysuria and urgency.  Endocrine: Denies polyuria, polyphagia and heat intolerance Musculoskeletal: Negative for myalgias and arthralgias.  Skin: Negative for pallor and wound.  Neurological: Negative for dizziness and headaches   SUBJECTIVE:    VITAL SIGNS: BP (!) 71/44   Pulse 73   Temp 97.9 F (36.6 C) (Oral)   Resp 13   Ht 5' 4.5" (1.638 m)   Wt 153 lb 3.2 oz (69.5 kg)   LMP 12/21/2014 (Approximate)   SpO2 100%   BMI 25.89 kg/m   HEMODYNAMICS:    VENTILATOR SETTINGS:    INTAKE / OUTPUT: I/O last 3 completed shifts: In: 1000 [I.V.:1000] Out: 100  [Blood:100]  PHYSICAL EXAMINATION: General: Awake, in in moderate distress Neuro: Alert and oriented x3, follows commands, no focal deficits HEENT: PERRLA, trachea midline, no JVD Cardiovascular: Apical pulse regular, S1-S2, no murmur regurg or gallop, +2 pulses bilaterally, no edema Lungs: Bilateral breath sounds without any wheezes or rhonchi Abdomen: Nondistended, surgical incision with dressing intact mild bleeding noted on the surgical dressing Musculoskeletal: Positive range of motion, no deformity Skin: Warm and dry  LABS:  BMET Recent Labs  Lab 03/08/18 2357  NA 139  K 4.5  CL 109  CO2 24  BUN 19  CREATININE 0.87  GLUCOSE 312*    Electrolytes Recent Labs  Lab 03/08/18 2357  CALCIUM 8.1*    CBC Recent Labs  Lab 03/08/18 2357 03/09/18 0423  WBC 10.0 8.9  HGB 12.2 10.8*  HCT 37.0 32.5*  PLT 123* 113*    Coag's No results for input(s): APTT, INR in the last 168 hours.  Sepsis Markers Recent Labs  Lab 03/09/18 0423  LATICACIDVEN 2.5*    ABG Recent Labs  Lab 03/09/18 0346  PHART 7.25*  PCO2ART 53*  PO2ART 103    Liver Enzymes No results for input(s): AST, ALT, ALKPHOS, BILITOT, ALBUMIN in the last 168 hours.  Cardiac Enzymes No results for input(s): TROPONINI, PROBNP in the last 168 hours.  Glucose Recent Labs  Lab 03/08/18 1235 03/08/18 1832 03/08/18 2023 03/09/18 0224 03/09/18 0316 03/09/18 0341  GLUCAP 133* 227* 208* 302* 272* 269*    Imaging US Abdomen Limited  Result Date: 03/09/2018 CLINICAL DATA:  Recent surgery with laparoscopic small bowel repair, lysis of adhesions, and appendectomy. Patient is having pain. EXAM: ULTRASOUND ABDOMEN LIMITED COMPARISON:  CT abdomen and pelvis 06/12/2015 FINDINGS: Limited images of the abdominal incision region and 4 quadrants of the abdomen demonstrate no evidence of free fluid or mass lesion. Gas-filled bowel are seen. IMPRESSION: No free fluid demonstrated in the abdomen sonographically.  Electronically Signed   By: Lucienne Capers M.D.   On: 03/09/2018 04:32    ASSESSMENT Hypovolemic shock Postoperative pain Postop anemia-mild Type 2 diabetes  PLAN Hemodynamic monitoring per ICU protocol IV fluids and pressors to maintain mean arterial blood pressure greater than 65 Trend cardiac enzymes Trend CBC and transfuse for hemoglobin less than 7 Blood glucose monitoring with sliding scale insulin coverage; patient is hyperglycemic will start start insulin coverage with Lantus 5 units q. at bedtime first dose now GI and DVT prophylaxis-no pharmacologic DVT prophylaxis  FAMILY  - Updates: Patient updated on treatment plan.  No family at bedside.  Will update when available Riyansh Gerstner S. [facility] ANP-BC Pulmonary and Critical Care Medicine [facility] Pager [phone] or [phone]  NB: This document was prepared using Dragon voice recognition software and may include unintentional dictation errors.   03/09/2018, 6:24 AM

## 2018-03-09 NOTE — Progress Notes (Signed)
Pt requesting blistex for her lips. MD notified and order placed. Will continue to monitor.

## 2018-03-09 NOTE — Progress Notes (Signed)
Pt PCA alarm alarming again; pt has NOT pressed the PCA button since 2300 last night; pt mouth opened and not answering when RN talks to pt; resp rate is 8 and BP was low; O2 sats still at 100% on 2L O2; pt repositioned and slightly opened eyes; another RN went to get a 2nd dose of narcan; RN in room getting VS (see VS flowsheet); pt still not very responsive to RNs; RN got pt to sit up and take a sip of diet ginger ale; pt resp rate improved but as soon as pt put head back to pillow, resp rate to 8-10 breaths per minute; when RN said "narcan" pt said "no, I don't want that"; [facility] in room again and Care Coordinator calling MD again; while pt is somewhat talking with RNs, resp rate only 10 (2 RNs counting); per [facility] "we need to give you this narcan again, you are still too sedated"; 0.4mg  narcan given at [place]; per Dr. Leafy Ro please have Respiratory Therapist evaluate pt; RT arrived at [place] and evaluated pt

## 2018-03-09 NOTE — Progress Notes (Signed)
Pt BP 76/52 after turning phenylephrine off. Phenylephrine started back at 20 mcg. BP now 89/63. Will continue to monitor.

## 2018-03-09 NOTE — Progress Notes (Signed)
Respiratory rate of 8 breaths per minute witnessed.Patient lethargic and difficult to arouse with sternal rubbing. RN and nursing supervisor notified and 0.4 mg narcan given per protocol. Patient level of consciousness improved. RN at bedside. Dr. Leafy Ro notified.

## 2018-03-09 NOTE — Progress Notes (Signed)
Responded to rapid response for this patient couple of hours ago.  No RT inventions needed at that time.  Asked to reassess this patient since she continues to have some issues of hypoventilation per nursing staff.  She received Dilaudid hours ago and has had narcan for reversal when rapid was initiated. Her respirations have been under 10 and saturation has dropped requiring sternal rubs just to arouse her. Patient was what I would call semi awake when I entered the room. BBS was essentially clear. Vitals stable. She does not have a pulmonary history but did acknowledge that she the OSA dx in her chart and stated that she had gastric sleeve years ago and lost a substantial amount of weight.  My thinking out loud did stimulate a patient response where I would not think patient would have issues with CO2 retention.  She knew exactly what a blood gas was and said that her husband to have them on several occasions. She associated blood gas with pain. She asked me what did her lungs sound like.  I could not offer any suggestions for this patient as to why she would still have episodes of extreme hypoventilation at times. An abg would only tell you results at the time of presentation not when patient is awake and alert.

## 2018-03-09 NOTE — Progress Notes (Signed)
To ICU at this time; RN on phone giving ICU nurse report; Dr. Leafy Ro running through unit to see pt; Dr. Leafy Ro walked with nurses to transport pt to ICU

## 2018-03-09 NOTE — Progress Notes (Signed)
0.8 mg of dilauded given via PCA. Respiratory rate alarm beeping on PCA. RN entered room to assess. Patient found with respiratory rate of 4 breaths per minute. Patient was minimally arousable with sternal rubs. Rapid response called and appropriate staff at bedside. 0.4 mg Narcan given per nursing supervisor order. Patient awake and lethargic. Complaining of 8 out of 10 pain. Dr. Leafy Ro notified.

## 2018-03-09 NOTE — Progress Notes (Signed)
[place] Progress Note Patient Name: CAPRI VEALS DOB: June 02, 1976 MRN: [id number]   Date of Service  03/09/2018  HPI/Events of Note  Hypotension and intermittent profound obtundation and respiratory depression s/p abdominal surgery complicated by injury to bowel. She has a history of chronic pain on narcotics and received a fair amount of post-op oral and iv narcotics. She is on IV-PCA narcotics.  eICU Interventions  Normal Saline 1000 ml iv fluid bolus, add Toradol and iv Acetaminophen to spare narcotics, check CBC and Lactate, empiric Aztreonam + Flagyl for bowel injury with potential peritoneal soiling, keep NPO for now, hold narcotics until mental status back to baseline.        Kerry Kass Ogan 03/09/2018, 4:05 AM

## 2018-03-09 NOTE — Progress Notes (Signed)
Patient ID: Marissa [name], female   DOB: 1976-04-11, 42 y.o.   MRN: [id number] New hemoglobin lab reviewed and now hemoglobin is 12. Low suspicion of active bleeding. Will follow closely.  CBC Latest Ref Rng & Units 03/09/2018 03/09/2018 03/08/2018  WBC 3.6 - 11.0 K/uL - 8.9 10.0  Hemoglobin 12.0 - 16.0 g/dL 12.4 10.8(L) 12.2  Hematocrit 35.0 - 47.0 % 38.0 32.5(L) 37.0  Platelets 150 - 440 K/uL - 113(L) 123(L)

## 2018-03-09 NOTE — Progress Notes (Signed)
Situation: Called to patient room for rapid response after respiratory rate dropped and she became difficult to arouse. She had received 15mg  of oxycodone at 10pm, which is her home dose, and the dilaudid PCA started at 11:30. She receive 0.5mg  bolus followed by 0.3mg  patient delivered dose, followed by the decreased consciousness episode. She was given narcan per protocol and recovered, and all narcotics discontinued. However, over the next few hours she did experience several more periods of decreased activity, respiratory rate and responsiveness, and the decision was made to transfer her to the ICU for monitoring. I have ordered an abdominal ultrasound to assess free fluid in the abdomen.  On my exam, her abdomen is soft and her lungs are clear. Heart rate is regular. Her incision beneath the honeycomb is clean, dry and intact with minimal blood on the inferior edge. Her O2 sats remain at 100% on 2L [place], with lowest value of 95%. Her blood sugar was elevated to 302and she received 7u of insulin per my verbal order while I was in a vaginal delivery on the floor. It was 269 on arrival to the ICU.  She is drowsy and responsive, and on arrival to the ICU asked not to be intubated, responded appropriately to the idea of the ABG stick, and did tolerate significant pain as they performed skin care.  Vitals:   03/09/18 0317 03/09/18 0323  BP:  (!) 69/44  Pulse:  72  Resp: 10 11  Temp:    SpO2:       After discussion with the [facility] ICU attending, we have decided to make her NPO for now, get a blood gas, continue to hold narcotics and start IV tylenol, toradol, and start antibiotics.  She is agreeable to this plan.   Update:  Limited abdominal ultrasound at the bedside without significant blood in the peritoneal cavity. Will await formal read, but no evidence of intraperitoneal bleeding on exam.

## 2018-03-09 NOTE — Progress Notes (Signed)
Order received to place PICC per Dr. Leafy Ro. Patient on neosynephrine, but still hypotensive. Attempted right brachial vein, but unable to secure access due to vasoconstriction. Patient's nurse informed of unsuccessful attempt. If still needed and more stable, will attempt again in the am. Patient does have two functioning IVs for medication administration.

## 2018-03-09 NOTE — Progress Notes (Signed)
Pt has PCA started at 2300; at 2330 her alarm on the PCA module was alarming; pt resp rate was 6 breaths per minute; RN sat pt up and rubbed her back; 2 RNs in room talking to pt and pt not answering back; a rapid response was called at this time; a third RN came in and was talking with pt and doing sternal rub; pt opened eyes occasionally but still not talking; AC (stephanie brothers) arrived 1st with rapid response; based on pt home meds (per MD ok to give tonight; meds and dosages checked with MD and pt when pt arrived to unit at 2130) that were given tonight and starting the PCA, AC recommended narcan to be given; 0.4mg  narcan at this time; VS improved (see VS flowsheet); Dr. Leafy Ro notified of pt, pt's VS, and rapid response; orders to DC oxycodone and change the lock out interval on PCA orders from 8 minutes to 20 minutes

## 2018-03-09 NOTE — Progress Notes (Signed)
Patient ID: Marissa [name], female   DOB: 1976-01-13, 42 y.o.   MRN: [id number]     [facility] Day(s): 1.   Post op day(s): 1 Day Post-Op.   Interval History: Patient seen and examined. Patient refers significant pain. Alert, oriented x3. History of respiratory depression last night reviewed.   Vital signs in last 24 hours: [min-max] current  Temp:  [97.3 F (36.3 C)-98.2 F (36.8 C)] 97.7 F (36.5 C) (07/27 0900) Pulse Rate:  [68-113] 70 (07/27 1000) Resp:  [0-22] 13 (07/27 1000) BP: (69-121)/(43-90) 102/69 (07/27 1000) SpO2:  [88 %-100 %] 100 % (07/27 0545) Weight:  [69.5 kg (153 lb 3.2 oz)] 69.5 kg (153 lb 3.2 oz) (07/26 1227)     Height: 5' 4.5" (163.8 cm) Weight: 69.5 kg (153 lb 3.2 oz) BMI (Calculated): 25.9    Physical Exam:  Constitutional: alert, cooperative and no distress  Gastrointestinal: soft, tender to palpation, and non-distended. Wounds dry and clean.   Labs:  CBC Latest Ref Rng & Units 03/09/2018 03/08/2018 02/27/2018  WBC 3.6 - 11.0 K/uL 8.9 10.0 4.2  Hemoglobin 12.0 - 16.0 g/dL 10.8(L) 12.2 12.9  Hematocrit 35.0 - 47.0 % 32.5(L) 37.0 38.9  Platelets 150 - 440 K/uL 113(L) 123(L) 136(L)   CMP Latest Ref Rng & Units 03/08/2018 02/27/2018 12/12/2017  Glucose 70 - 99 mg/dL 312(H) 119(H) 108(H)  BUN 6 - 20 mg/dL 19 19 13   Creatinine 0.44 - 1.00 mg/dL 0.87 0.74 0.82  Sodium 135 - 145 mmol/L 139 141 141  Potassium 3.5 - 5.1 mmol/L 4.5 4.2 4.4  Chloride 98 - 111 mmol/L 109 105 106  CO2 22 - 32 mmol/L 24 26 28   Calcium 8.9 - 10.3 mg/dL 8.1(L) 9.6 9.8  Total Protein 6.5 - 8.1 g/dL - - -  Total Bilirubin 0.3 - 1.2 mg/dL - - -  Alkaline Phos 38 - 126 U/L - - -  AST 15 - 41 U/L - - -  ALT 14 - 54 U/L - - -    Imaging studies:  EXAM: ULTRASOUND ABDOMEN LIMITED  COMPARISON:  CT abdomen and pelvis 06/12/2015  FINDINGS: Limited images of the abdominal incision region and 4 quadrants of the abdomen demonstrate no evidence of free fluid or  mass lesion. Gas-filled bowel are seen.  IMPRESSION: No free fluid demonstrated in the abdomen sonographically.   Electronically Signed   By: Lucienne Capers M.D.   On: 03/09/2018 04:32  Assessment/Plan:  42 y.o. female with small bowel injury 1 Day Post-Op s/p small bowel resection, extensive lysis of adhesions and appendectomy. Patient with significant episode of respiratory depression and lethargy. Hemoglobin drop from 12 to 10 does not explain the episode. At this moment I don't have an explanation for that episode. Agree to follow hemoglobin trend.  If patient does not improve, CT scan may be considered. Ultrasound images does not shows significant intraabdominal fluid. Passing gas. I will not progress diet from clear liquid due to current patient medical status. When stabilized will be able to progress. Will continue following patient closely.   Arnold Long, MD

## 2018-03-09 NOTE — Progress Notes (Signed)
Patient transferred to ICU/CCU per MD orders. 2 RN's and Dr Leafy Ro accompanying patient. All belongings gathered and sent with patient.

## 2018-03-09 NOTE — Anesthesia Postprocedure Evaluation (Signed)
Anesthesia Post Note  Patient: Marissa [name]  Procedure(s) Performed: HYSTERECTOMY TOTAL LAPAROSCOPIC (N/A Abdomen) APPENDECTOMY (N/A Abdomen) SMALL BOWEL REPAIR (N/A Abdomen) SMALL BOWEL RESECTION (N/A Abdomen) LYSIS OF ADHESION (N/A Abdomen)  Patient location during evaluation: SICU Anesthesia Type: General Level of consciousness: awake Pain management: pain level controlled Vital Signs Assessment: post-procedure vital signs reviewed and stable Respiratory status: spontaneous breathing and patient connected to nasal cannula oxygen Cardiovascular status: stable Postop Assessment: no apparent nausea or vomiting Anesthetic complications: no     Last Vitals:  Vitals:   03/09/18 0615 03/09/18 0630  BP: (!) 89/58 96/60  Pulse:    Resp: 20 16  Temp:    SpO2:      Last Pain:  Vitals:   03/09/18 0430  TempSrc: Oral  PainSc: 8                  Precious Haws Piscitello

## 2018-03-10 ENCOUNTER — Inpatient Hospital Stay: Payer: Self-pay

## 2018-03-10 ENCOUNTER — Encounter: Payer: Self-pay | Admitting: Obstetrics and Gynecology

## 2018-03-10 LAB — COMPREHENSIVE METABOLIC PANEL
ALBUMIN: 2.8 g/dL — AB (ref 3.5–5.0)
ALK PHOS: 51 U/L (ref 38–126)
ALT: 33 U/L (ref 0–44)
AST: 38 U/L (ref 15–41)
Anion gap: 5 (ref 5–15)
BILIRUBIN TOTAL: 0.8 mg/dL (ref 0.3–1.2)
BUN: 15 mg/dL (ref 6–20)
CO2: 22 mmol/L (ref 22–32)
CREATININE: 0.77 mg/dL (ref 0.44–1.00)
Calcium: 8.1 mg/dL — ABNORMAL LOW (ref 8.9–10.3)
Chloride: 114 mmol/L — ABNORMAL HIGH (ref 98–111)
GFR calc non Af Amer: >60 mL/min (ref >60)
Glucose, Bld: 104 mg/dL — ABNORMAL HIGH (ref 70–99)
POTASSIUM: 3.9 mmol/L (ref 3.5–5.1)
Sodium: 141 mmol/L (ref 135–145)
Total Protein: 5.2 g/dL — ABNORMAL LOW (ref 6.5–8.1)

## 2018-03-10 LAB — OCCULT BLOOD X 1 CARD TO LAB, STOOL: Fecal Occult Bld: NEGATIVE

## 2018-03-10 LAB — GLUCOSE, CAPILLARY
GLUCOSE-CAPILLARY: 47 mg/dL — AB (ref 70–99)
GLUCOSE-CAPILLARY: 94 mg/dL (ref 70–99)
Glucose-Capillary: 61 mg/dL — ABNORMAL LOW (ref 70–99)
Glucose-Capillary: 105 mg/dL — ABNORMAL HIGH (ref 70–99)
Glucose-Capillary: 101 mg/dL — ABNORMAL HIGH (ref 70–99)
Glucose-Capillary: 130 mg/dL — ABNORMAL HIGH (ref 70–99)
Glucose-Capillary: 93 mg/dL (ref 70–99)

## 2018-03-10 LAB — CBC
HEMATOCRIT: 32.5 % — AB (ref 35.0–47.0)
Hemoglobin: 11 g/dL — ABNORMAL LOW (ref 12.0–16.0)
MCH: 30.8 pg (ref 26.0–34.0)
MCHC: 33.7 g/dL (ref 32.0–36.0)
MCV: 91.3 fL (ref 80.0–100.0)
Platelets: 118 10*3/uL — ABNORMAL LOW (ref 150–440)
RBC: 3.56 MIL/uL — ABNORMAL LOW (ref 3.80–5.20)
RDW: 14.4 % (ref 11.5–14.5)
WBC: 6.1 10*3/uL (ref 3.6–11.0)

## 2018-03-10 LAB — PHOSPHORUS: Phosphorus: 3 mg/dL (ref 2.5–4.6)

## 2018-03-10 LAB — MAGNESIUM: Magnesium: 1.8 mg/dL (ref 1.7–2.4)

## 2018-03-10 MED ORDER — GABAPENTIN 100 MG PO CAPS
200.0000 mg | ORAL_CAPSULE | Freq: Two times a day (BID) | ORAL | Status: DC
  Administered 2018-03-11 – 2018-03-13 (×4): 200 mg via ORAL
  Filled 2018-03-10 (×4): qty 2

## 2018-03-10 MED ORDER — GABAPENTIN 300 MG PO CAPS
400.0000 mg | ORAL_CAPSULE | Freq: Every day | ORAL | Status: DC
  Administered 2018-03-13: 400 mg via ORAL
  Filled 2018-03-10 (×2): qty 1

## 2018-03-10 NOTE — Progress Notes (Signed)
[name] FSBS 47, pt given 4 oz of cranberry juice. Pt is asymptomatic. Will recheck in 15 min per policy.

## 2018-03-10 NOTE — Progress Notes (Signed)
Patient ID: [name], female   DOB: 10/20/1975, 42 y.o.   MRN: [id number]      [facility] Hospital Day(s): 2.   Post op day(s): 2 Days Post-Op.   Interval History: Patient seen and examined, no acute events or new complaints overnight. Patient reports the pain is a little bit better but still feels mild nausea, denies vomiting.  Vital signs in last 24 hours: [min-max] current  Temp:  [97.8 F (36.6 C)-98.3 F (36.8 C)] 98.1 F (36.7 C) (07/28 0200) Pulse Rate:  [65-86] 80 (07/28 0630) Resp:  [11-24] 15 (07/28 0630) BP: (69-122)/(44-78) 116/69 (07/28 0630) SpO2:  [89 %-100 %] 89 % (07/28 0630)     Height: 5' 4.5" (163.8 cm) Weight: 69.5 kg (153 lb 3.2 oz) BMI (Calculated): 25.9   Physical Exam:  Constitutional: alert, cooperative and no distress  Respiratory: breathing non-labored at rest  Cardiovascular: regular rate and sinus rhythm  Gastrointestinal: soft, moderate-tender on lower quadrants, and non-distended. Wound dry and clean.   Labs:  CBC Latest Ref Rng & Units 03/10/2018 03/09/2018 03/09/2018  WBC 3.6 - 11.0 K/uL 6.1 - -  Hemoglobin 12.0 - 16.0 g/dL 11.0(L) 10.5(L) 11.1(L)  Hematocrit 35.0 - 47.0 % 32.5(L) 31.8(L) 33.3(L)  Platelets 150 - 440 K/uL 118(L) - -   CMP Latest Ref Rng & Units 03/10/2018 03/08/2018 02/27/2018  Glucose 70 - 99 mg/dL 104(H) 312(H) 119(H)  BUN 6 - 20 mg/dL 15 19 19   Creatinine 0.44 - 1.00 mg/dL 0.77 0.87 0.74  Sodium 135 - 145 mmol/L 141 139 141  Potassium 3.5 - 5.1 mmol/L 3.9 4.5 4.2  Chloride 98 - 111 mmol/L 114(H) 109 105  CO2 22 - 32 mmol/L 22 24 26   Calcium 8.9 - 10.3 mg/dL 8.1(L) 8.1(L) 9.6  Total Protein 6.5 - 8.1 g/dL 5.2(L) - -  Total Bilirubin 0.3 - 1.2 mg/dL 0.8 - -  Alkaline Phos 38 - 126 U/L 51 - -  AST 15 - 41 U/L 38 - -  ALT 0 - 44 U/L 33 - -    Imaging studies: No new pertinent imaging studies   Assessment/Plan:  42 y.o. female with small bowel injury 2 Day Post-Op s/p small bowel resection, extensive  lysis of adhesions and appendectomy.  Patient has been stable without new episode of altered mental status. Blood pressure was better but still on the vasopressor. Discussed with Dr. Jefferson Fuel that will try to wean. Physical exam is not worrisome for acute abdomen. Normal WBC count, stable hemoglobin. Since patient still with mild nausea even when she is passing gas, I will keep her in clear liquids. Agree with current management. Will continue to follow closely.   Arnold Long, MD

## 2018-03-10 NOTE — Progress Notes (Signed)
Spoke with Dr. Leafy Ro concerning continued need for PICC, as this nurse was unsuccessful yesterday. Patient still on a vasopressor, at a low dose, has two functioning PIVs and her blood pressure is much improved. Dr. Leafy Ro unaware of PICC order. Once rounding and conferring with Dr. Jefferson Fuel, if need persists for a PICC, she will replace the order.

## 2018-03-10 NOTE — Progress Notes (Signed)
Report called to Tamela Oddi, Therapist, sports. Also, pt requesting lower dose of gabapentin and cepecol lozenge, spoke with Burman Nieves, NP. No cepecol lozenge available on ICU currently. New order for Gabapentin entered by Burman Nieves, NP but dose not available prior to transport. Notified Matt in pharmacy that pt is requesting cepecol lozenge and gabapentin. Matt adjusted gabapentin time to 2345 and will send lozenge to 3rd floor/room 345. Patient belongings sent with patient. Foley emptied prior to transport. Vital signs stable.

## 2018-03-10 NOTE — Progress Notes (Signed)
Follow up - Critical Care Medicine Note  Patient Details:    Marissa [name] is an 42 y.o. female.postop following a small bowel repair, small bowel resection and lysis of adhesions. Patient became unresponsive and hypotensive after receiving oxycodone 15 mg at 10:10 PM, and started on a Dilaudid PCA. She was given Narcan and a fluid bolus without significant improvement in blood pressure hence she was transferred to the ICU for further management.   Lines, Airways, Drains: Urethral Catheter dr. Leafy Ro Non-latex;Straight-tip;Double-lumen 16 Fr. (Active)  Indication for Insertion or Continuance of Catheter Unstable critical patients (first 24-48 hours) 03/10/2018  1:00 AM  Site Assessment Clean;Intact 03/10/2018  1:00 AM  Catheter Maintenance Bag below level of bladder;Catheter secured;Drainage bag/tubing not touching floor;Insertion date on drainage bag;No dependent loops;Seal intact 03/10/2018  1:00 AM  Collection Container Standard drainage bag 03/10/2018  1:00 AM  Securement Method Leg strap 03/10/2018  1:00 AM  Urinary Catheter Interventions Unclamped 03/10/2018  1:00 AM  Output (mL) 200 mL 03/10/2018  6:15 AM    Anti-infectives:  Anti-infectives (From admission, onward)   Start     Dose/Rate Route Frequency Ordered Stop   03/09/18 1700  piperacillin-tazobactam (ZOSYN) IVPB 3.375 g     3.375 g 12.5 mL/hr over 240 Minutes Intravenous Every 8 hours 03/09/18 1649     03/09/18 0500  aztreonam (AZACTAM) 1 g in sodium chloride 0.9 % 100 mL IVPB  Status:  Discontinued     1 g 200 mL/hr over 30 Minutes Intravenous Every 8 hours 03/09/18 0448 03/09/18 1649   03/09/18 0400  aztreonam (AZACTAM) injection 1 g  Status:  Discontinued     1 g Intramuscular Every 8 hours 03/09/18 0357 03/09/18 0448   03/09/18 0400  metroNIDAZOLE (FLAGYL) IVPB 500 mg  Status:  Discontinued    Note to Pharmacy:  First dose now   500 mg 100 mL/hr over 60 Minutes Intravenous Every 8 hours 03/09/18 0357 03/09/18 1649   03/08/18 1207  clindamycin (CLEOCIN) 900 MG/50ML IVPB    Note to Pharmacy:  [place]: cabinet override      03/08/18 1207 03/08/18 1449   03/08/18 0600  gentamicin (GARAMYCIN) 5 mg/kg, clindamycin (CLEOCIN) 900 mg in dextrose 5 % 100 mL IVPB  Status:  Discontinued     212 mL/hr over 30 Minutes Intravenous On call to O.R. 03/07/18 2215 03/07/18 2217   03/08/18 0600  clindamycin (CLEOCIN) IVPB 900 mg     900 mg 100 mL/hr over 30 Minutes Intravenous On call to O.R. 03/07/18 2221 03/08/18 1519   03/08/18 0600  gentamicin (GARAMYCIN) 350 mg in dextrose 5 % 50 mL IVPB     5 mg/kg  69.2 kg 117.5 mL/hr over 30 Minutes Intravenous On call to O.R. 03/07/18 2221 03/08/18 1449      Microbiology: Results for orders placed or performed during the hospital encounter of 03/08/18  MRSA PCR Screening     Status: None   Collection Time: 03/09/18  3:51 AM  Result Value Ref Range Status   MRSA by PCR NEGATIVE NEGATIVE Final    Comment:        The GeneXpert MRSA Assay (FDA approved for NASAL specimens only), is one component of a comprehensive MRSA colonization surveillance program. It is not intended to diagnose MRSA infection nor to guide or monitor treatment for MRSA infections. Performed at [facility], [address], [place], [place] [address]      Studies: US Abdomen Limited  Result Date: 03/09/2018  CLINICAL DATA:  Recent surgery with laparoscopic small bowel repair, lysis of adhesions, and appendectomy. Patient is having pain. EXAM: ULTRASOUND ABDOMEN LIMITED COMPARISON:  CT abdomen and pelvis 06/12/2015 FINDINGS: Limited images of the abdominal incision region and 4 quadrants of the abdomen demonstrate no evidence of free fluid or mass lesion. Gas-filled bowel are seen. IMPRESSION: No free fluid demonstrated in the abdomen sonographically. Electronically Signed   By: Lucienne Capers M.D.   On: 03/09/2018 04:32   [place] Ekg Site Rite  Result Date: 03/09/2018 If Site  Rite image not attached, placement could not be confirmed due to current cardiac rhythm.   Consults: Treatment Team:  Herbert Pun, MD   Subjective:    Overnight Issues: patient has done well overnight, still complaining of abdominal pain but looks very comfortable. No difficulty with breathing. Presently on Neo-Synephrine but weaning to goal mean arterial pressure greater than 65  Objective:  Vital signs for last 24 hours: Temp:  [97.8 F (36.6 C)-98.3 F (36.8 C)] 98.1 F (36.7 C) (07/28 0200) Pulse Rate:  [65-86] 80 (07/28 0630) Resp:  [11-24] 15 (07/28 0630) BP: (69-122)/(44-78) 116/69 (07/28 0630) SpO2:  [89 %-100 %] 89 % (07/28 0630)  Hemodynamic parameters for last 24 hours:    Intake/Output from previous day: 07/27 0701 - 07/28 0700 In: 4436.8 [P.O.:290; I.V.:3611.4; IV Piggyback:535.4] Out: 855 [Urine:855]  Intake/Output this shift: No intake/output data recorded.  Vent settings for last 24 hours:    Physical Exam:  Patient is awake, alert, no acute distress Vital signs: Please see the above listed vital signs HEENT: Trachea midline, no thyromegaly noted, no distention of neck veins Cardiovascular: Regular rate and rhythm Abdominal exam: Positive bowel sounds, tender to palpation, incision noted, clean without drainage Extremities: No clubbing cyanosis or edema noted Neurologic: No focal deficits appreciated  Assessment/Plan:   Status post bowel resection. Complications included altered mental status, hypotension. Mental status was most likely secondary to pain medication as she is awake alert and tolerating as needed morphine well.  Hypotension. Most likely secondary to sepsis, is on Zosyn, will wean Neo-Synephrine as tolerated  Anemia. No evidence of active bleeding, H&H is stable at 11/32.5  We'll monitor in intensive care unit until pressors have been successfully stopped them will transfer to the floor  Marissa [name] 03/10/2018  *Care  during the described time interval was provided by me and/or other providers on the critical care team.  I have reviewed this patient's available data, including medical history, events of note, physical examination and test results as part of my evaluation.

## 2018-03-10 NOTE — Progress Notes (Signed)
FSBS recheck 94

## 2018-03-10 NOTE — Progress Notes (Addendum)
1630 up in chair without issues. B/P 100/71 and patient  states she feels great. Talking with friends eating dinner. 1730 B/P drops to 74/46. Patient is totally asymtomatic but is complaining of pain. Patient put back to bed and allowed to rest. B/P recovers nicely. No pain medication given. Patient is resting quietly. See vital sign flow sheet for specifics. Transfer on hold for now.

## 2018-03-10 NOTE — Progress Notes (Signed)
Patient is refusing PICC placement at this time. PICC nurse to follow up tomorrow for possible placement. Patient still has two functioning IVs.Spoke with patient's nurse, Myra, RN, who stated that the patient would probably just need a single lumen PICC for home antibiotics. Orders will be changed from a TL to a SL for anticipated home antibiotic needs.

## 2018-03-10 NOTE — Progress Notes (Signed)
2 Days Post-Op       Procedure(s) with comments: HYSTERECTOMY TOTAL LAPAROSCOPIC (N/A) - attempted APPENDECTOMY (N/A) - By Dr. Windell Moment SMALL BOWEL REPAIR (N/A) SMALL BOWEL RESECTION (N/A) LYSIS OF ADHESION (N/A) Subjective: The patient is doing well, vitals staying stable  Objective: Vital signs in last 24 hours: Temp:  [97.8 F (36.6 C)-98.3 F (36.8 C)] 98.1 F (36.7 C) (07/28 0200) Pulse Rate:  [65-86] 80 (07/28 0630) Resp:  [11-24] 15 (07/28 0630) BP: (69-122)/(44-78) 116/69 (07/28 0630) SpO2:  [89 %-100 %] 89 % (07/28 0630)  Intake/Output  Intake/Output Summary (Last 24 hours) at 03/10/2018 0917 Last data filed at 03/10/2018 0616 Gross per 24 hour  Intake 4386.79 ml  Output 855 ml  Net 3531.79 ml    Lab Results: Recent Labs    03/08/18 2357 03/09/18 0423  03/09/18 1730 03/09/18 2253 03/10/18 0546  HGB 12.2 10.8*   < > 11.1* 10.5* 11.0*  HCT 37.0 32.5*   < > 33.3* 31.8* 32.5*  WBC 10.0 8.9  --   --   --  6.1  PLT 123* 113*  --   --   --  118*   < > = values in this interval not displayed.                 Results for orders placed or performed during the hospital encounter of 03/08/18 (from the past 24 hour(s))  Hemoglobin and hematocrit, blood     Status: None   Collection Time: 03/09/18 11:03 AM  Result Value Ref Range   Hemoglobin 12.4 12.0 - 16.0 g/dL   HCT 38.0 35.0 - 47.0 %  Troponin I     Status: None   Collection Time: 03/09/18 11:03 AM  Result Value Ref Range   Troponin I <0.03 <0.03 ng/mL  Glucose, capillary     Status: Abnormal   Collection Time: 03/09/18 12:09 PM  Result Value Ref Range   Glucose-Capillary 155 (H) 70 - 99 mg/dL  Glucose, capillary     Status: Abnormal   Collection Time: 03/09/18  4:26 PM  Result Value Ref Range   Glucose-Capillary 160 (H) 70 - 99 mg/dL  Troponin I     Status: None   Collection Time: 03/09/18  4:35 PM  Result Value Ref Range   Troponin I <0.03 <0.03 ng/mL  Glucose, capillary     Status: Abnormal   Collection Time: 03/09/18  5:23 PM  Result Value Ref Range   Glucose-Capillary 229 (H) 70 - 99 mg/dL  Lactic acid, plasma     Status: Abnormal   Collection Time: 03/09/18  5:29 PM  Result Value Ref Range   Lactic Acid, Venous 2.1 (HH) 0.5 - 1.9 mmol/L  Hemoglobin and hematocrit, blood     Status: Abnormal   Collection Time: 03/09/18  5:30 PM  Result Value Ref Range   Hemoglobin 11.1 (L) 12.0 - 16.0 g/dL   HCT 33.3 (L) 35.0 - 47.0 %  Glucose, capillary     Status: Abnormal   Collection Time: 03/09/18  8:11 PM  Result Value Ref Range   Glucose-Capillary 122 (H) 70 - 99 mg/dL  Hemoglobin and hematocrit, blood     Status: Abnormal   Collection Time: 03/09/18 10:53 PM  Result Value Ref Range   Hemoglobin 10.5 (L) 12.0 - 16.0 g/dL   HCT 31.8 (L) 35.0 - 47.0 %  Troponin I     Status: None   Collection Time: 03/09/18 10:53 PM  Result Value Ref Range  Troponin I <0.03 <0.03 ng/mL  Glucose, capillary     Status: Abnormal   Collection Time: 03/09/18 11:57 PM  Result Value Ref Range   Glucose-Capillary 47 (L) 70 - 99 mg/dL  Glucose, capillary     Status: None   Collection Time: 03/10/18 12:22 AM  Result Value Ref Range   Glucose-Capillary 94 70 - 99 mg/dL  Glucose, capillary     Status: Abnormal   Collection Time: 03/10/18  4:22 AM  Result Value Ref Range   Glucose-Capillary 61 (L) 70 - 99 mg/dL  CBC     Status: Abnormal   Collection Time: 03/10/18  5:46 AM  Result Value Ref Range   WBC 6.1 3.6 - 11.0 K/uL   RBC 3.56 (L) 3.80 - 5.20 MIL/uL   Hemoglobin 11.0 (L) 12.0 - 16.0 g/dL   HCT 32.5 (L) 35.0 - 47.0 %   MCV 91.3 80.0 - 100.0 fL   MCH 30.8 26.0 - 34.0 pg   MCHC 33.7 32.0 - 36.0 g/dL   RDW 14.4 11.5 - 14.5 %   Platelets 118 (L) 150 - 440 K/uL  Comprehensive metabolic panel     Status: Abnormal   Collection Time: 03/10/18  5:46 AM  Result Value Ref Range   Sodium 141 135 - 145 mmol/L   Potassium 3.9 3.5 - 5.1 mmol/L   Chloride 114 (H) 98 - 111 mmol/L   CO2 22 22 - 32  mmol/L   Glucose, Bld 104 (H) 70 - 99 mg/dL   BUN 15 6 - 20 mg/dL   Creatinine, Ser 0.77 0.44 - 1.00 mg/dL   Calcium 8.1 (L) 8.9 - 10.3 mg/dL   Total Protein 5.2 (L) 6.5 - 8.1 g/dL   Albumin 2.8 (L) 3.5 - 5.0 g/dL   AST 38 15 - 41 U/L   ALT 33 0 - 44 U/L   Alkaline Phosphatase 51 38 - 126 U/L   Total Bilirubin 0.8 0.3 - 1.2 mg/dL   GFR calc non Af Amer >60 >60 mL/min   GFR calc Af Amer >60 >60 mL/min   Anion gap 5 5 - 15  Magnesium     Status: None   Collection Time: 03/10/18  5:46 AM  Result Value Ref Range   Magnesium 1.8 1.7 - 2.4 mg/dL  Phosphorus     Status: None   Collection Time: 03/10/18  5:46 AM  Result Value Ref Range   Phosphorus 3.0 2.5 - 4.6 mg/dL  Glucose, capillary     Status: Abnormal   Collection Time: 03/10/18  8:10 AM  Result Value Ref Range   Glucose-Capillary 105 (H) 70 - 99 mg/dL    Assessment/Plan: 2 Days Post-Op       Procedure(s) with comments: HYSTERECTOMY TOTAL LAPAROSCOPIC (N/A) - attempted APPENDECTOMY (N/A) - By Dr. Windell Moment SMALL BOWEL REPAIR (N/A) SMALL BOWEL RESECTION (N/A) LYSIS OF ADHESION (N/A)  Appreciate ICU recs. Continue current care. Out to floor when stable.   Benjaman Kindler, MD   LOS: 2 days   Benjaman Kindler 03/10/2018, 9:17 AM

## 2018-03-11 ENCOUNTER — Encounter: Payer: Self-pay | Admitting: Obstetrics and Gynecology

## 2018-03-11 LAB — GLUCOSE, CAPILLARY
GLUCOSE-CAPILLARY: 215 mg/dL — AB (ref 70–99)
GLUCOSE-CAPILLARY: 130 mg/dL — AB (ref 70–99)
Glucose-Capillary: 107 mg/dL — ABNORMAL HIGH (ref 70–99)
Glucose-Capillary: 159 mg/dL — ABNORMAL HIGH (ref 70–99)
Glucose-Capillary: 168 mg/dL — ABNORMAL HIGH (ref 70–99)
Glucose-Capillary: 171 mg/dL — ABNORMAL HIGH (ref 70–99)
Glucose-Capillary: 72 mg/dL (ref 70–99)
Glucose-Capillary: 86 mg/dL (ref 70–99)
Glucose-Capillary: 87 mg/dL (ref 70–99)

## 2018-03-11 LAB — CBC
HEMATOCRIT: 30.1 % — AB (ref 35.0–47.0)
HEMOGLOBIN: 10 g/dL — AB (ref 12.0–16.0)
MCH: 30.1 pg (ref 26.0–34.0)
MCHC: 33.3 g/dL (ref 32.0–36.0)
MCV: 90.4 fL (ref 80.0–100.0)
Platelets: 101 10*3/uL — ABNORMAL LOW (ref 150–440)
RBC: 3.33 MIL/uL — ABNORMAL LOW (ref 3.80–5.20)
RDW: 14.2 % (ref 11.5–14.5)
WBC: 4.8 10*3/uL (ref 3.6–11.0)

## 2018-03-11 LAB — COMPREHENSIVE METABOLIC PANEL
ALK PHOS: 56 U/L (ref 38–126)
ALT: 30 U/L (ref 0–44)
ANION GAP: 4 — AB (ref 5–15)
AST: 31 U/L (ref 15–41)
Albumin: 2.5 g/dL — ABNORMAL LOW (ref 3.5–5.0)
BILIRUBIN TOTAL: 0.9 mg/dL (ref 0.3–1.2)
BUN: 11 mg/dL (ref 6–20)
CALCIUM: 8.2 mg/dL — AB (ref 8.9–10.3)
CO2: 27 mmol/L (ref 22–32)
Chloride: 111 mmol/L (ref 98–111)
Creatinine, Ser: 0.76 mg/dL (ref 0.44–1.00)
GFR calc Af Amer: >60 mL/min (ref >60)
GLUCOSE: 96 mg/dL (ref 70–99)
POTASSIUM: 3.9 mmol/L (ref 3.5–5.1)
Sodium: 142 mmol/L (ref 135–145)
TOTAL PROTEIN: 5 g/dL — AB (ref 6.5–8.1)

## 2018-03-11 LAB — MAGNESIUM: MAGNESIUM: 1.9 mg/dL (ref 1.7–2.4)

## 2018-03-11 LAB — LACTIC ACID, PLASMA: LACTIC ACID, VENOUS: 0.9 mmol/L (ref 0.5–1.9)

## 2018-03-11 LAB — PHOSPHORUS: Phosphorus: 3 mg/dL (ref 2.5–4.6)

## 2018-03-11 MED ORDER — IBUPROFEN 800 MG PO TABS
800.0000 mg | ORAL_TABLET | Freq: Three times a day (TID) | ORAL | Status: DC
  Filled 2018-03-11: qty 1

## 2018-03-11 MED ORDER — ACETAMINOPHEN 500 MG PO TABS
1000.0000 mg | ORAL_TABLET | Freq: Three times a day (TID) | ORAL | Status: DC
  Administered 2018-03-11 – 2018-03-13 (×5): 1000 mg via ORAL
  Filled 2018-03-11 (×5): qty 2

## 2018-03-11 MED ORDER — SIMETHICONE 80 MG PO CHEW
80.0000 mg | CHEWABLE_TABLET | Freq: Four times a day (QID) | ORAL | Status: DC | PRN
  Administered 2018-03-11 – 2018-03-12 (×3): 80 mg via ORAL
  Filled 2018-03-11 (×3): qty 1

## 2018-03-11 MED ORDER — HYDROMORPHONE HCL 1 MG/ML IJ SOLN
0.2000 mg | INTRAMUSCULAR | Status: DC | PRN
  Administered 2018-03-11 (×3): 0.2 mg via INTRAVENOUS
  Filled 2018-03-11 (×3): qty 1

## 2018-03-11 MED ORDER — OXYCODONE HCL 5 MG PO TABS
10.0000 mg | ORAL_TABLET | ORAL | Status: DC | PRN
  Administered 2018-03-11: 15 mg via ORAL
  Administered 2018-03-11: 10 mg via ORAL
  Administered 2018-03-11: 15 mg via ORAL
  Administered 2018-03-11: 10 mg via ORAL
  Administered 2018-03-12 (×2): 15 mg via ORAL
  Administered 2018-03-12 – 2018-03-13 (×2): 10 mg via ORAL
  Filled 2018-03-11: qty 2
  Filled 2018-03-11 (×3): qty 3
  Filled 2018-03-11 (×2): qty 2
  Filled 2018-03-11: qty 3
  Filled 2018-03-11: qty 2

## 2018-03-11 NOTE — Progress Notes (Signed)
Patient ID: Marissa [name], female   DOB: 1976/05/18, 42 y.o.   MRN: [id number]     [place] Hospital Day(s): 3.   Post op day(s): 3 Days Post-Op.   Interval History: Patient seen and examined, no acute events or new complaints overnight. Patient reports feeling much better. Refers still has some pain but controlled with pain medication. Refers is tolerating the regular diet. Patient eating potato chips upon arrival to room.   Vital signs in last 24 hours: [min-max] current  Temp:  [98 F (36.7 C)-99.6 F (37.6 C)] 98 F (36.7 C) (07/29 1546) Pulse Rate:  [71-94] 83 (07/29 1823) Resp:  [15-21] 18 (07/29 1546) BP: (105-155)/(66-98) 105/66 (07/29 1546) SpO2:  [94 %-100 %] 97 % (07/29 1823)     Height: 5' 4.5" (163.8 cm) Weight: 69.5 kg (153 lb 3.2 oz) BMI (Calculated): 25.9    Physical Exam:  Constitutional: alert, cooperative and no distress  Respiratory: breathing non-labored at rest  Cardiovascular: regular rate and sinus rhythm  Gastrointestinal: soft, non-tender, and non-distended. Wounds dry and clean.   Labs:  CBC Latest Ref Rng & Units 03/11/2018 03/10/2018 03/09/2018  WBC 3.6 - 11.0 K/uL 4.8 6.1 -  Hemoglobin 12.0 - 16.0 g/dL 10.0(L) 11.0(L) 10.5(L)  Hematocrit 35.0 - 47.0 % 30.1(L) 32.5(L) 31.8(L)  Platelets 150 - 440 K/uL 101(L) 118(L) -   CMP Latest Ref Rng & Units 03/11/2018 03/10/2018 03/08/2018  Glucose 70 - 99 mg/dL 96 104(H) 312(H)  BUN 6 - 20 mg/dL 11 15 19   Creatinine 0.44 - 1.00 mg/dL 0.76 0.77 0.87  Sodium 135 - 145 mmol/L 142 141 139  Potassium 3.5 - 5.1 mmol/L 3.9 3.9 4.5  Chloride 98 - 111 mmol/L 111 114(H) 109  CO2 22 - 32 mmol/L 27 22 24   Calcium 8.9 - 10.3 mg/dL 8.2(L) 8.1(L) 8.1(L)  Total Protein 6.5 - 8.1 g/dL 5.0(L) 5.2(L) -  Total Bilirubin 0.3 - 1.2 mg/dL 0.9 0.8 -  Alkaline Phos 38 - 126 U/L 56 51 -  AST 15 - 41 U/L 31 38 -  ALT 0 - 44 U/L 30 33 -    Imaging studies: No new pertinent imaging studies   Assessment/Plan:  42  y.o.femalewith small bowel injury3 Day Post-Ops/p small bowel resection, extensive lysis of adhesions and appendectomy.  Patient today much better, active, alert, eating regular diet. From surgical stand point, patient is tolerating regular diet, passing gas and having bowel movements, pain controlled with pain medication and voiding spontaneously. May be discharged when primary physician consider is adequate.   Arnold Long, MD

## 2018-03-11 NOTE — Care Management Important Message (Signed)
Initial copy of Medicare IM signed.  Copy left with patient in room.

## 2018-03-11 NOTE — Progress Notes (Signed)
3 Days Post-Op       Procedure(s) with comments: HYSTERECTOMY TOTAL LAPAROSCOPIC (N/A) - attempted APPENDECTOMY (N/A) - By Dr. Windell Moment SMALL BOWEL REPAIR (N/A) SMALL BOWEL RESECTION (N/A) LYSIS OF ADHESION (N/A) Subjective: The patient transferred up to postop care from ICU overnight. She is now on zosyn iv for presumed sepsis, and her vitals are stable. She sleeps easily but O2 sats at 98% while sleeping.  Objective: Vital signs in last 24 hours: Temp:  [98.1 F (36.7 C)-99.6 F (37.6 C)] 98.2 F (36.8 C) (07/29 0748) Pulse Rate:  [74-94] 80 (07/29 1230) Resp:  [13-21] 18 (07/29 1202) BP: (73-155)/(46-98) 109/68 (07/29 1202) SpO2:  [94 %-100 %] 98 % (07/29 1230)  Intake/Output  Intake/Output Summary (Last 24 hours) at 03/11/2018 1420 Last data filed at 03/11/2018 1110 Gross per 24 hour  Intake 2978.58 ml  Output 1350 ml  Net 1628.58 ml    Lab Results: Recent Labs    03/09/18 0423  03/09/18 2253 03/10/18 0546 03/11/18 0458  HGB 10.8*   < > 10.5* 11.0* 10.0*  HCT 32.5*   < > 31.8* 32.5* 30.1*  WBC 8.9  --   --  6.1 4.8  PLT 113*  --   --  118* 101*   < > = values in this interval not displayed.                 Results for orders placed or performed during the hospital encounter of 03/08/18 (from the past 24 hour(s))  Glucose, capillary     Status: Abnormal   Collection Time: 03/10/18  4:30 PM  Result Value Ref Range   Glucose-Capillary 130 (H) 70 - 99 mg/dL  Occult blood card to lab, stool     Status: None   Collection Time: 03/10/18  6:15 PM  Result Value Ref Range   Fecal Occult Bld NEGATIVE NEGATIVE  Glucose, capillary     Status: None   Collection Time: 03/10/18  7:51 PM  Result Value Ref Range   Glucose-Capillary 93 70 - 99 mg/dL  Glucose, capillary     Status: None   Collection Time: 03/11/18 12:06 AM  Result Value Ref Range   Glucose-Capillary 72 70 - 99 mg/dL  Glucose, capillary     Status: None   Collection Time: 03/11/18  4:11 AM  Result  Value Ref Range   Glucose-Capillary 87 70 - 99 mg/dL  CBC     Status: Abnormal   Collection Time: 03/11/18  4:58 AM  Result Value Ref Range   WBC 4.8 3.6 - 11.0 K/uL   RBC 3.33 (L) 3.80 - 5.20 MIL/uL   Hemoglobin 10.0 (L) 12.0 - 16.0 g/dL   HCT 30.1 (L) 35.0 - 47.0 %   MCV 90.4 80.0 - 100.0 fL   MCH 30.1 26.0 - 34.0 pg   MCHC 33.3 32.0 - 36.0 g/dL   RDW 14.2 11.5 - 14.5 %   Platelets 101 (L) 150 - 440 K/uL  Comprehensive metabolic panel     Status: Abnormal   Collection Time: 03/11/18  4:58 AM  Result Value Ref Range   Sodium 142 135 - 145 mmol/L   Potassium 3.9 3.5 - 5.1 mmol/L   Chloride 111 98 - 111 mmol/L   CO2 27 22 - 32 mmol/L   Glucose, Bld 96 70 - 99 mg/dL   BUN 11 6 - 20 mg/dL   Creatinine, Ser 0.76 0.44 - 1.00 mg/dL   Calcium 8.2 (L) 8.9 - 10.3 mg/dL  Total Protein 5.0 (L) 6.5 - 8.1 g/dL   Albumin 2.5 (L) 3.5 - 5.0 g/dL   AST 31 15 - 41 U/L   ALT 30 0 - 44 U/L   Alkaline Phosphatase 56 38 - 126 U/L   Total Bilirubin 0.9 0.3 - 1.2 mg/dL   GFR calc non Af Amer >60 >60 mL/min   GFR calc Af Amer >60 >60 mL/min   Anion gap 4 (L) 5 - 15  Magnesium     Status: None   Collection Time: 03/11/18  4:58 AM  Result Value Ref Range   Magnesium 1.9 1.7 - 2.4 mg/dL  Phosphorus     Status: None   Collection Time: 03/11/18  4:58 AM  Result Value Ref Range   Phosphorus 3.0 2.5 - 4.6 mg/dL  Lactic acid, plasma     Status: None   Collection Time: 03/11/18  4:58 AM  Result Value Ref Range   Lactic Acid, Venous 0.9 0.5 - 1.9 mmol/L  Glucose, capillary     Status: None   Collection Time: 03/11/18  8:11 AM  Result Value Ref Range   Glucose-Capillary 86 70 - 99 mg/dL   Comment 1 Notify RN   Glucose, capillary     Status: Abnormal   Collection Time: 03/11/18 12:34 PM  Result Value Ref Range   Glucose-Capillary 159 (H) 70 - 99 mg/dL  Glucose, capillary     Status: Abnormal   Collection Time: 03/11/18  1:11 PM  Result Value Ref Range   Glucose-Capillary 171 (H) 70 - 99 mg/dL    Comment 1 Notify RN     Assessment/Plan: 3 Days Post-Op       Procedure(s) with comments: HYSTERECTOMY TOTAL LAPAROSCOPIC (N/A) - attempted APPENDECTOMY (N/A) - By Dr. Windell Moment SMALL BOWEL REPAIR (N/A) SMALL BOWEL RESECTION (N/A) LYSIS OF ADHESION (N/A)  - Foley out this morning. Is & Os appropriate prior. Will continue to monitor. - Labs stable. No evidence of intraabdominal bleeding - vitals stable without medication support - is tolerating regular diet after advance this morning. S/p bowel movement x1, flatus since yesterday. - is ambulating without concern. - pain controlled with po meds currently - continue abx x1 week - start lovenox ppx until fully ambulatory; scds until then as well.  Benjaman Kindler, MD   LOS: 3 days   Benjaman Kindler 03/11/2018, 2:20 PM

## 2018-03-11 NOTE — Progress Notes (Signed)
Pt called RN in room to look at incision, she stated it was bleeding. On assessment this morning only old blood/drainage was noted on honeycomb dressing. During this assessment honeycomb was coming unpeeled at the bottom and a moderate amount of bright red bleed was noted on dressing. After removing the dressing and exposing the incision, the top of the incision in was intact but the bottom 1.5 inch of the incison was open and slightly bleeding. A new honeycomb dressing was put on the incision and Dr. Leafy Ro was notified.

## 2018-03-11 NOTE — Progress Notes (Signed)
Dr. Leafy Ro notified of Pt. Transfer to room 345 from ICU and request for pain medication because Pt. States oral pain medication will not help her abdominal pain and Pt. Only has IV Fentanyl. Order for PRN Dilaudid IV or PRN Oxycodone PO for  pain. Pt. oriented to room and Falls Prevention. Pt. Placed on continuous Pulse Ox. As per order.

## 2018-03-12 ENCOUNTER — Inpatient Hospital Stay: Payer: PPO

## 2018-03-12 ENCOUNTER — Encounter: Payer: Self-pay | Admitting: Radiology

## 2018-03-12 LAB — COMPREHENSIVE METABOLIC PANEL
ALT: 25 U/L (ref 0–44)
AST: 23 U/L (ref 15–41)
Albumin: 2.4 g/dL — ABNORMAL LOW (ref 3.5–5.0)
Alkaline Phosphatase: 63 U/L (ref 38–126)
Anion gap: 4 — ABNORMAL LOW (ref 5–15)
BUN: 10 mg/dL (ref 6–20)
CHLORIDE: 107 mmol/L (ref 98–111)
CO2: 27 mmol/L (ref 22–32)
Calcium: 8.1 mg/dL — ABNORMAL LOW (ref 8.9–10.3)
Creatinine, Ser: 0.7 mg/dL (ref 0.44–1.00)
GFR calc Af Amer: >60 mL/min (ref >60)
GFR calc non Af Amer: >60 mL/min (ref >60)
Glucose, Bld: 189 mg/dL — ABNORMAL HIGH (ref 70–99)
Potassium: 3.5 mmol/L (ref 3.5–5.1)
SODIUM: 138 mmol/L (ref 135–145)
Total Bilirubin: 0.6 mg/dL (ref 0.3–1.2)
Total Protein: 5.1 g/dL — ABNORMAL LOW (ref 6.5–8.1)

## 2018-03-12 LAB — CBC
HCT: 29 % — ABNORMAL LOW (ref 35.0–47.0)
Hemoglobin: 9.7 g/dL — ABNORMAL LOW (ref 12.0–16.0)
MCH: 30.2 pg (ref 26.0–34.0)
MCHC: 33.5 g/dL (ref 32.0–36.0)
MCV: 90.3 fL (ref 80.0–100.0)
Platelets: 113 10*3/uL — ABNORMAL LOW (ref 150–440)
RBC: 3.21 MIL/uL — ABNORMAL LOW (ref 3.80–5.20)
RDW: 14 % (ref 11.5–14.5)
WBC: 6.1 10*3/uL (ref 3.6–11.0)

## 2018-03-12 LAB — GLUCOSE, CAPILLARY
GLUCOSE-CAPILLARY: 107 mg/dL — AB (ref 70–99)
GLUCOSE-CAPILLARY: 117 mg/dL — AB (ref 70–99)
Glucose-Capillary: 104 mg/dL — ABNORMAL HIGH (ref 70–99)
Glucose-Capillary: 162 mg/dL — ABNORMAL HIGH (ref 70–99)
Glucose-Capillary: 159 mg/dL — ABNORMAL HIGH (ref 70–99)

## 2018-03-12 LAB — SURGICAL PATHOLOGY

## 2018-03-12 LAB — LACTIC ACID, PLASMA: Lactic Acid, Venous: 0.8 mmol/L (ref 0.5–1.9)

## 2018-03-12 MED ORDER — IOPAMIDOL (ISOVUE-370) INJECTION 76%
75.0000 mL | Freq: Once | INTRAVENOUS | Status: AC | PRN
  Administered 2018-03-12: 75 mL via INTRAVENOUS

## 2018-03-12 MED ORDER — IOPAMIDOL (ISOVUE-300) INJECTION 61%
15.0000 mL | INTRAVENOUS | Status: AC
  Administered 2018-03-12 (×2): 15 mL via ORAL

## 2018-03-12 NOTE — Progress Notes (Signed)
PT to radiology

## 2018-03-12 NOTE — Discharge Summary (Signed)
Gynecological Discharge Summary  Patient Name: Marissa [name] DOB: 1975/09/22 MRN: [id number]  Date of Admission: 03/08/2018 Date of Discharge: 03/13/2018  Hospital course:   The patient was admitted on the day of scheduled surgery, which was modified to open laparotomy with general surgery for extensive lysis of adhesions, bowel resection and appendectomy. She did not have a hysterectomy, as adhesions significant and uterus appeared to be retroperitoneal, left lateral and posterior to the sigmoid colon.  Postoperatively she was admitted to the floor, and spent a day in the ICU when her breathing dropped to 4 breaths/min on a PCA. Pain was initially managed with PCA which was transitioned to PO on POD#2. She advanced from a liquid to regular diet, and by day of discharge was tolerating pizza.    Foley cath was discontinued on postoperative day number 2 and patient passed a voiding trial without difficulty.  She did receive a chest xray to r/o pneumonia on POD#4 when she developed a temp to 100.8 which did not repeat. She also received an abdominal xray series to confirm no bowel obstruction, and a CT scan of abd and pelvis with po and iv contrast.  Patient was felt to be stable for discharge on postoperative day number 5 when she was tolerating a regular diet, pain was controlled with po pain medications, and she was ambulating and voiding without difficulty. Vital signs were stable and physical exam remained benign throughout her hospital stay. Incision was clean,dry, and intact with staples except for 3cm on the inferior edge, which was packed wet to dry at discharge with plan for daily dressing changes.  She will follow up per below for post-op check.  Rx given for Percocet, Zofran, and Colace.    She was given specific instructions and numbers to call in written and verbal format. She verbalized understanding, agrees with the plan of care, and all questions answered to her  satisfaction.  Discharge Physical Exam:  BP 115/77 (BP Location: Left Arm)   Pulse 81   Temp 98.2 F (36.8 C) (Oral)   Resp 18   Ht 5' 4.5" (1.638 m)   Wt 69.5 kg (153 lb 3.2 oz)   LMP 12/21/2014 (Approximate)   SpO2 99%   BMI 25.89 kg/m   General: NAD CV: RRR Pulm: CTABL, nl effort ABD: s/nd/nt Incision: c/d/intact except for inferior portion, which is packed. DVT Evaluation: LE non-ttp, no evidence of DVT on exam.  Hemoglobin  Date Value Ref Range Status  03/12/2018 9.7 (L) 12.0 - 16.0 g/dL Final   HGB  Date Value Ref Range Status  07/01/2014 12.2 12.0 - 16.0 g/dL Final   HCT  Date Value Ref Range Status  03/12/2018 29.0 (L) 35.0 - 47.0 % Final  07/01/2014 38.0 35.0 - 47.0 % Final      Plan:  Marissa [name] was discharged to home in good condition. Follow-up appointment at [facility] in 1 days for dressing change.   Discharge Medications: Allergies as of 03/13/2018      Reactions   Bactrim [sulfamethoxazole-trimethoprim] Nausea And Vomiting   Ciprofloxacin Swelling   Vancomycin Nausea And Vomiting   Amoxicillin Nausea And Vomiting   Has patient had a PCN reaction causing immediate rash, facial/tongue/throat swelling, SOB or lightheadedness with hypotension: No Has patient had a PCN reaction causing severe rash involving mucus membranes or skin necrosis: No Has patient had a PCN reaction that required hospitalization: No Has patient had a PCN reaction occurring within the last 10 years:  Yes If all of the above answers are "NO", then may proceed with Cephalosporin use.   Ceftriaxone Nausea And Vomiting   Azithromycin Itching, Rash   Food Itching, Rash   "Greek yogurt"      Medication List    TAKE these medications   acetaminophen 500 MG tablet Commonly known as:  TYLENOL Take 1,000 mg by mouth 2 (two) times daily as needed for moderate pain or headache.   amitriptyline 50 MG tablet Commonly known as:  ELAVIL Take 50 mg by mouth at  bedtime.   atorvastatin 40 MG tablet Commonly known as:  LIPITOR Take 40 mg by mouth at bedtime.   BIOTIN PO Take 1 tablet by mouth daily.   butalbital-acetaminophen-caffeine 50-325-40 MG tablet Commonly known as:  FIORICET, ESGIC Take 1 tablet by mouth 2 (two) times daily as needed for migraine.   carvedilol 25 MG tablet Commonly known as:  COREG Take 1 tablet (25 mg total) by mouth 2 (two) times daily with a meal.   cloNIDine 0.2 MG tablet Commonly known as:  CATAPRES Take 0.2 mg by mouth 2 (two) times daily.   cyanocobalamin 1000 MCG/ML injection Commonly known as:  (VITAMIN B-12) Inject 1,000 mcg into the muscle every 30 (thirty) days.   docusate sodium 100 MG capsule Commonly known as:  COLACE Take 1 capsule (100 mg total) by mouth 2 (two) times daily.   gabapentin 300 MG capsule Commonly known as:  NEURONTIN Take 600-1,200 mg by mouth See admin instructions. Take 600 mg in the morning and afternoon, then take 1200 mg at bedtime   insulin aspart 100 UNIT/ML FlexPen Commonly known as:  NOVOLOG FLEXPEN Inject 10-20 Units into the skin 3 (three) times daily with meals. Sliding scale What changed:    how much to take  when to take this  additional instructions   levocetirizine 5 MG tablet Commonly known as:  XYZAL Take 5 mg by mouth daily as needed for allergies.   lidocaine 5 % Commonly known as:  LIDODERM Place 1 patch onto the skin every 12 (twelve) hours. Remove & Discard patch within 12 hours or as directed by MD   lisinopril 40 MG tablet Commonly known as:  PRINIVIL,ZESTRIL Take 40 mg by mouth daily.   meclizine 25 MG tablet Commonly known as:  ANTIVERT Take 1 tablet (25 mg total) by mouth 3 (three) times daily as needed for dizziness.   meloxicam 15 MG tablet Commonly known as:  MOBIC Take 1 tablet (15 mg total) by mouth daily.   NARCAN 4 MG/0.1ML Liqd nasal spray kit Generic drug:  naloxone CALL 911. INJECT SPRAY INTO NOSTRIL. IF NO RESPONSE  AFTER 2 MIN REPEAT IN OPPOSITE NOSTRIL.   nitroGLYCERIN 0.4 MG SL tablet Commonly known as:  NITROSTAT Place 0.4 mg under the tongue every 5 (five) minutes as needed for chest pain.   ondansetron 4 MG disintegrating tablet Commonly known as:  ZOFRAN ODT Take 1 tablet (4 mg total) by mouth every 6 (six) hours as needed for nausea.   OVER THE COUNTER MEDICATION Take 1 tablet by mouth daily. Mag O7 otc supplement   oxycodone 30 MG immediate release tablet Commonly known as:  ROXICODONE Take 15 mg by mouth every 4 (four) hours as needed for pain.   oxyCODONE-acetaminophen 10-325 MG tablet Commonly known as:  PERCOCET Take 1 tablet by mouth every 4 (four) hours as needed for pain.   rOPINIRole 0.5 MG tablet Commonly known as:  REQUIP Take 0.5-1.5 mg by  mouth See admin instructions. Take 0.5 mg in the morning and 1.5 mg at night   tiZANidine 4 MG tablet Commonly known as:  ZANAFLEX Limit 1 tab by mouth per day or 2-3 times per day if tolerated What changed:    how much to take  how to take this  when to take this  additional instructions   topiramate 50 MG tablet Commonly known as:  TOPAMAX Take 50-100 mg by mouth See admin instructions. Take 50 mg in the morning and 100 mg at night   traZODone 50 MG tablet Commonly known as:  DESYREL Take 50 mg by mouth at bedtime.   Vitamin D3 50000 units Tabs Take 500,000 Units by mouth 2 (two) times a week.            Discharge Care Instructions  (From admission, onward)        Start     Ordered   03/13/18 0000  Change dressing (specify)    Comments:  Dressing change: 1 times per day using we to dry dressing.   03/13/18 0707      Oxycodone 54m po PRN pain, on top of pain meds contract.  Signed: BBenjaman Kindler MD 8:47 AM

## 2018-03-12 NOTE — Progress Notes (Signed)
4 Days Post-Op       Procedure(s) with comments: HYSTERECTOMY TOTAL LAPAROSCOPIC - Attempted - converted to open procedure for Bowel Resection (N/A) - attempted APPENDECTOMY (N/A) - By Dr. Windell Moment SMALL BOWEL REPAIR (N/A) SMALL BOWEL RESECTION (N/A) LYSIS OF ADHESION (N/A) Subjective: Continued pain 7/10, which she states is similar to her home pain level and is controlled with po meds.   She is having superficial left lower quadrant heaviness compared to the right side, visually apparent on exam.    Denies shortness of breath or chest pain. O2 sats 97% now, dropped early this morning to 80s. WBC remains normal. Temp max to 100.8 today x1, now resolved.  Chest Xray negative for pneumonia on my read, but mild atelectasis. Abdominal series negative for bowel obstruction Planned CT with po and iv contrast for intraabdominal process.  Receiving insulin for diabetes per sliding scale  Objective: Vital signs in last 24 hours: Temp:  [98 F (36.7 C)-100.8 F (38.2 C)] 98.3 F (36.8 C) (07/30 1647) Pulse Rate:  [73-101] 75 (07/30 1834) Resp:  [18-20] 18 (07/30 1647) BP: (90-155)/(62-90) 131/80 (07/30 1647) SpO2:  [85 %-100 %] 97 % (07/30 1834)  Intake/Output  Intake/Output Summary (Last 24 hours) at 03/12/2018 1918 Last data filed at 03/12/2018 1856 Gross per 24 hour  Intake 3425.83 ml  Output 1150 ml  Net 2275.83 ml    Physical Exam:  General: Alert and oriented. Lungs: No labored, on room air GI: Soft, Nondistended. Left lower quadrant mildly but superficially enlarged. Incisions: Clean and dry. Inferior 3cm portion separated, minimal drainage, packed with wet to dry iodoform dressing.   Lab Results: Recent Labs    03/10/18 0546 03/11/18 0458 03/12/18 1657  HGB 11.0* 10.0* 9.7*  HCT 32.5* 30.1* 29.0*  WBC 6.1 4.8 6.1  PLT 118* 101* 113*                 Results for orders placed or performed during the hospital encounter of 03/08/18 (from the past 24 hour(s))   Glucose, capillary     Status: Abnormal   Collection Time: 03/11/18  8:02 PM  Result Value Ref Range   Glucose-Capillary 215 (H) 70 - 99 mg/dL  Glucose, capillary     Status: Abnormal   Collection Time: 03/11/18 11:25 PM  Result Value Ref Range   Glucose-Capillary 130 (H) 70 - 99 mg/dL  Glucose, capillary     Status: Abnormal   Collection Time: 03/12/18  3:31 AM  Result Value Ref Range   Glucose-Capillary 104 (H) 70 - 99 mg/dL  Glucose, capillary     Status: Abnormal   Collection Time: 03/12/18  8:33 AM  Result Value Ref Range   Glucose-Capillary 107 (H) 70 - 99 mg/dL  Glucose, capillary     Status: Abnormal   Collection Time: 03/12/18 12:07 PM  Result Value Ref Range   Glucose-Capillary 117 (H) 70 - 99 mg/dL  Glucose, capillary     Status: Abnormal   Collection Time: 03/12/18  4:46 PM  Result Value Ref Range   Glucose-Capillary 162 (H) 70 - 99 mg/dL  CBC     Status: Abnormal   Collection Time: 03/12/18  4:57 PM  Result Value Ref Range   WBC 6.1 3.6 - 11.0 K/uL   RBC 3.21 (L) 3.80 - 5.20 MIL/uL   Hemoglobin 9.7 (L) 12.0 - 16.0 g/dL   HCT 29.0 (L) 35.0 - 47.0 %   MCV 90.3 80.0 - 100.0 fL   MCH 30.2 26.0 -  34.0 pg   MCHC 33.5 32.0 - 36.0 g/dL   RDW 14.0 11.5 - 14.5 %   Platelets 113 (L) 150 - 440 K/uL  Comprehensive metabolic panel     Status: Abnormal   Collection Time: 03/12/18  4:57 PM  Result Value Ref Range   Sodium 138 135 - 145 mmol/L   Potassium 3.5 3.5 - 5.1 mmol/L   Chloride 107 98 - 111 mmol/L   CO2 27 22 - 32 mmol/L   Glucose, Bld 189 (H) 70 - 99 mg/dL   BUN 10 6 - 20 mg/dL   Creatinine, Ser 0.70 0.44 - 1.00 mg/dL   Calcium 8.1 (L) 8.9 - 10.3 mg/dL   Total Protein 5.1 (L) 6.5 - 8.1 g/dL   Albumin 2.4 (L) 3.5 - 5.0 g/dL   AST 23 15 - 41 U/L   ALT 25 0 - 44 U/L   Alkaline Phosphatase 63 38 - 126 U/L   Total Bilirubin 0.6 0.3 - 1.2 mg/dL   GFR calc non Af Amer >60 >60 mL/min   GFR calc Af Amer >60 >60 mL/min   Anion gap 4 (L) 5 - 15  Lactic acid,  plasma     Status: None   Collection Time: 03/12/18  4:57 PM  Result Value Ref Range   Lactic Acid, Venous 0.8 0.5 - 1.9 mmol/L    Assessment/Plan: 4 Days Post-Op       Procedure(s) with comments: HYSTERECTOMY TOTAL LAPAROSCOPIC - Attempted - converted to open procedure for Bowel Resection (N/A) - attempted APPENDECTOMY (N/A) - By Dr. Windell Moment SMALL BOWEL REPAIR (N/A) SMALL BOWEL RESECTION (N/A) LYSIS OF ADHESION (N/A)   - CT scan with iv and po contrast ordered.  - Labs stable. - vitals stable without medication support - is tolerating regular diet after advance this morning. S/p bowel movement, tolerating pizza. - is ambulating without concern. - pain controlled with po meds currently - continue abx x1 week - lovenox held today   Benjaman Kindler, MD   LOS: 4 days   Benjaman Kindler 03/12/2018, 7:18 PM

## 2018-03-12 NOTE — Progress Notes (Signed)
Pt called RN to the room to assess abdomen, stated that the left side felt "heavier" than the right side and was larger than the right side. Upon assessment I noticed that the left side of the abdomen was a bit more distended than the right side and did feel more firm than the right. MD to round on patient soon.

## 2018-03-12 NOTE — Progress Notes (Signed)
Patient ID: Marissa [name], female   DOB: 1976/07/20, 42 y.o.   MRN: [id number]     [place] Hospital Day(s): 4.   Post op day(s): 4 Days Post-Op.   Interval History: Patient seen and examined Patient reports feeling left lower quadrant fullness compared to the right side. Denies significant abdominal pain. Denies shortness of breath or chest pain.   Vital signs in last 24 hours: [min-max] current  Temp:  [98 F (36.7 C)-100.8 F (38.2 C)] 98.9 F (37.2 C) (07/30 1039) Pulse Rate:  [72-100] 78 (07/30 1358) Resp:  [18-20] 18 (07/30 1039) BP: (90-155)/(62-90) 120/79 (07/30 1039) SpO2:  [85 %-100 %] 100 % (07/30 1358)     Height: 5' 4.5" (163.8 cm) Weight: 69.5 kg (153 lb 3.2 oz) BMI (Calculated): 25.9   Physical Exam:  Constitutional: alert, cooperative and no distress  Respiratory: breathing non-labored at rest, with nasal canula.  Cardiovascular: regular rate and sinus rhythm  Gastrointestinal: soft, moderate-tender on left lower quadrant. Non distended. Wound with inferior part draining.   Labs:  CBC Latest Ref Rng & Units 03/11/2018 03/10/2018 03/09/2018  WBC 3.6 - 11.0 K/uL 4.8 6.1 -  Hemoglobin 12.0 - 16.0 g/dL 10.0(L) 11.0(L) 10.5(L)  Hematocrit 35.0 - 47.0 % 30.1(L) 32.5(L) 31.8(L)  Platelets 150 - 440 K/uL 101(L) 118(L) -   CMP Latest Ref Rng & Units 03/11/2018 03/10/2018 03/08/2018  Glucose 70 - 99 mg/dL 96 104(H) 312(H)  BUN 6 - 20 mg/dL 11 15 19   Creatinine 0.44 - 1.00 mg/dL 0.76 0.77 0.87  Sodium 135 - 145 mmol/L 142 141 139  Potassium 3.5 - 5.1 mmol/L 3.9 3.9 4.5  Chloride 98 - 111 mmol/L 111 114(H) 109  CO2 22 - 32 mmol/L 27 22 24   Calcium 8.9 - 10.3 mg/dL 8.2(L) 8.1(L) 8.1(L)  Total Protein 6.5 - 8.1 g/dL 5.0(L) 5.2(L) -  Total Bilirubin 0.3 - 1.2 mg/dL 0.9 0.8 -  Alkaline Phos 38 - 126 U/L 56 51 -  AST 15 - 41 U/L 31 38 -  ALT 0 - 44 U/L 30 33 -    Imaging studies: No new pertinent imaging studies   Assessment/Plan:  42 y.o.femalewith  small bowel injury4Day Post-Ops/p small bowel resection, extensive lysis of adhesions and appendectomy.  Patient today with recurrent abdominal pain more localized to the left lower quadrant. Also had a fever episode and decreased peripheral O2 sat to 92 at room air this morning. Right now patient in no respiratory distress, no cough, no chest pain. No tachycardia. Agree to repeat labs and order CT scan to rule out intraabdominal cause. Will follow closely.   Arnold Long, MD

## 2018-03-12 NOTE — Progress Notes (Signed)
Pt to CT via wc by orderly

## 2018-03-12 NOTE — Progress Notes (Signed)
Back from Radiology.

## 2018-03-13 LAB — GLUCOSE, CAPILLARY
GLUCOSE-CAPILLARY: 149 mg/dL — AB (ref 70–99)
Glucose-Capillary: 90 mg/dL (ref 70–99)
Glucose-Capillary: 120 mg/dL — ABNORMAL HIGH (ref 70–99)

## 2018-03-13 MED ORDER — OXYCODONE-ACETAMINOPHEN 10-325 MG PO TABS: 1.0000 | ORAL_TABLET | ORAL | 0 refills | Status: DC | PRN

## 2018-03-13 MED ORDER — DOCUSATE SODIUM 100 MG PO CAPS: 100.0000 mg | ORAL_CAPSULE | Freq: Two times a day (BID) | ORAL | 0 refills | Status: DC

## 2018-03-13 MED ORDER — ONDANSETRON 4 MG PO TBDP: 4.0000 mg | ORAL_TABLET | Freq: Four times a day (QID) | ORAL | 0 refills | Status: DC | PRN

## 2018-03-13 NOTE — Care Management Important Message (Signed)
Patient discharged before I could arrive to floor to deliver IM.  Initial signed and copy left with patient on 03/11/18.

## 2018-03-13 NOTE — Final Progress Note (Signed)
[place] Hospital Day(s): 5.   Post op day(s): 5 Days Post-Op.   Interval History: Patient seen and examined, no acute events or new complaints overnight. Patient reports feeling well today. Refer pain under control. Denies nausea or vomiting. No fever in last 24 hours.   Vital signs in last 24 hours: [min-max] current  Temp:  [98.2 F (36.8 C)-100.8 F (38.2 C)] 98.4 F (36.9 C) (07/31 0405) Pulse Rate:  [71-101] 71 (07/31 0405) Resp:  [16-20] 16 (07/31 0405) BP: (105-134)/(67-83) 105/67 (07/31 0405) SpO2:  [85 %-100 %] 98 % (07/31 0405)     Height: 5' 4.5" (163.8 cm) Weight: 69.5 kg (153 lb 3.2 oz) BMI (Calculated): 25.9   Intake/Output this shift:  Total I/O In: 1172.9 [P.O.:250; I.V.:872.9; IV Piggyback:50] Out: 900 [Urine:900]    Physical Exam:  Constitutional: alert, cooperative and no distress  Respiratory: breathing non-labored at rest  Cardiovascular: regular rate and sinus rhythm  Gastrointestinal: soft, non-tender, and non-distended. Inferior portion of wound open with clean tissue. No purulent secretions. No cellulitis.   Labs:  CBC Latest Ref Rng & Units 03/12/2018 03/11/2018 03/10/2018  WBC 3.6 - 11.0 K/uL 6.1 4.8 6.1  Hemoglobin 12.0 - 16.0 g/dL 9.7(L) 10.0(L) 11.0(L)  Hematocrit 35.0 - 47.0 % 29.0(L) 30.1(L) 32.5(L)  Platelets 150 - 440 K/uL 113(L) 101(L) 118(L)   CMP Latest Ref Rng & Units 03/12/2018 03/11/2018 03/10/2018  Glucose 70 - 99 mg/dL 189(H) 96 104(H)  BUN 6 - 20 mg/dL 10 11 15   Creatinine 0.44 - 1.00 mg/dL 0.70 0.76 0.77  Sodium 135 - 145 mmol/L 138 142 141  Potassium 3.5 - 5.1 mmol/L 3.5 3.9 3.9  Chloride 98 - 111 mmol/L 107 111 114(H)  CO2 22 - 32 mmol/L 27 27 22   Calcium 8.9 - 10.3 mg/dL 8.1(L) 8.2(L) 8.1(L)  Total Protein 6.5 - 8.1 g/dL 5.1(L) 5.0(L) 5.2(L)  Total Bilirubin 0.3 - 1.2 mg/dL 0.6 0.9 0.8  Alkaline Phos 38 - 126 U/L 63 56 51  AST 15 - 41 U/L 23 31 38  ALT 0 - 44 U/L 25 30 33   Imaging studies: No new  pertinent imaging studies   Assessment/Plan:  42 y.o.femalewith small bowel injury4Day Post-Ops/p small bowel resection, extensive lysis of adhesions and appendectomy.  Patienttoday with feeling much better, adequate mood, smiling. No abdominal pain, no nausea. Patient voiding spontaneously, tolerating regular diet, pain controlled with pain medications, ambulating. CT scan negative for abscess or contrast extravasation. Will discharge patient home today. Patient will be followed by Gynecology and Surgery as outpatient.   Arnold Long, MD

## 2018-03-13 NOTE — Progress Notes (Signed)
D/C instructions provided, pt states understanding, aware of follow up appt.

## 2018-03-14 DIAGNOSIS — I428 Other cardiomyopathies: Secondary | ICD-10-CM | POA: Diagnosis not present

## 2018-03-14 DIAGNOSIS — E119 Type 2 diabetes mellitus without complications: Secondary | ICD-10-CM | POA: Diagnosis not present

## 2018-03-14 DIAGNOSIS — Z4889 Encounter for other specified surgical aftercare: Secondary | ICD-10-CM | POA: Diagnosis not present

## 2018-03-14 DIAGNOSIS — I10 Essential (primary) hypertension: Secondary | ICD-10-CM | POA: Diagnosis not present

## 2018-03-14 DIAGNOSIS — T783XXA Angioneurotic edema, initial encounter: Secondary | ICD-10-CM | POA: Diagnosis not present

## 2018-03-14 DIAGNOSIS — R600 Localized edema: Secondary | ICD-10-CM | POA: Diagnosis not present

## 2018-03-14 DIAGNOSIS — E669 Obesity, unspecified: Secondary | ICD-10-CM | POA: Diagnosis not present

## 2018-03-14 DIAGNOSIS — T819XXA Unspecified complication of procedure, initial encounter: Secondary | ICD-10-CM | POA: Diagnosis not present

## 2018-03-14 DIAGNOSIS — T8131XA Disruption of external operation (surgical) wound, not elsewhere classified, initial encounter: Secondary | ICD-10-CM | POA: Diagnosis not present

## 2018-03-14 DIAGNOSIS — R0602 Shortness of breath: Secondary | ICD-10-CM | POA: Diagnosis not present

## 2018-03-14 DIAGNOSIS — I429 Cardiomyopathy, unspecified: Secondary | ICD-10-CM | POA: Diagnosis not present

## 2018-03-14 DIAGNOSIS — I509 Heart failure, unspecified: Secondary | ICD-10-CM | POA: Diagnosis not present

## 2018-03-14 DIAGNOSIS — F329 Major depressive disorder, single episode, unspecified: Secondary | ICD-10-CM | POA: Diagnosis not present

## 2018-03-16 ENCOUNTER — Inpatient Hospital Stay
Admission: EM | Admit: 2018-03-16 | Discharge: 2018-03-21 | DRG: 335 | Disposition: A | Discharge status: Home / Self Care | Payer: PPO | Attending: Surgery | Admitting: Surgery

## 2018-03-16 ENCOUNTER — Encounter: Payer: Self-pay | Admitting: Radiology

## 2018-03-16 ENCOUNTER — Emergency Department: Payer: PPO | Admitting: Anesthesiology

## 2018-03-16 ENCOUNTER — Other Ambulatory Visit: Payer: Self-pay

## 2018-03-16 ENCOUNTER — Encounter
Admission: EM | Disposition: A | Discharge status: Home / Self Care | Payer: Self-pay | Source: Home / Self Care | Attending: Surgery

## 2018-03-16 ENCOUNTER — Emergency Department: Payer: PPO

## 2018-03-16 DIAGNOSIS — G4730 Sleep apnea, unspecified: Secondary | ICD-10-CM | POA: Diagnosis present

## 2018-03-16 DIAGNOSIS — E785 Hyperlipidemia, unspecified: Secondary | ICD-10-CM | POA: Diagnosis present

## 2018-03-16 DIAGNOSIS — H547 Unspecified visual loss: Secondary | ICD-10-CM | POA: Diagnosis present

## 2018-03-16 DIAGNOSIS — I252 Old myocardial infarction: Secondary | ICD-10-CM

## 2018-03-16 DIAGNOSIS — K460 Unspecified abdominal hernia with obstruction, without gangrene: Principal | ICD-10-CM | POA: Diagnosis present

## 2018-03-16 DIAGNOSIS — F419 Anxiety disorder, unspecified: Secondary | ICD-10-CM | POA: Diagnosis not present

## 2018-03-16 DIAGNOSIS — F329 Major depressive disorder, single episode, unspecified: Secondary | ICD-10-CM | POA: Diagnosis not present

## 2018-03-16 DIAGNOSIS — Z79899 Other long term (current) drug therapy: Secondary | ICD-10-CM | POA: Diagnosis not present

## 2018-03-16 DIAGNOSIS — Z833 Family history of diabetes mellitus: Secondary | ICD-10-CM | POA: Diagnosis not present

## 2018-03-16 DIAGNOSIS — Z8249 Family history of ischemic heart disease and other diseases of the circulatory system: Secondary | ICD-10-CM | POA: Diagnosis not present

## 2018-03-16 DIAGNOSIS — Z7984 Long term (current) use of oral hypoglycemic drugs: Secondary | ICD-10-CM

## 2018-03-16 DIAGNOSIS — I2510 Atherosclerotic heart disease of native coronary artery without angina pectoris: Secondary | ICD-10-CM | POA: Diagnosis not present

## 2018-03-16 DIAGNOSIS — K219 Gastro-esophageal reflux disease without esophagitis: Secondary | ICD-10-CM | POA: Diagnosis not present

## 2018-03-16 DIAGNOSIS — E119 Type 2 diabetes mellitus without complications: Secondary | ICD-10-CM | POA: Diagnosis not present

## 2018-03-16 DIAGNOSIS — K660 Peritoneal adhesions (postprocedural) (postinfection): Secondary | ICD-10-CM | POA: Diagnosis present

## 2018-03-16 DIAGNOSIS — I959 Hypotension, unspecified: Secondary | ICD-10-CM | POA: Diagnosis not present

## 2018-03-16 DIAGNOSIS — G8929 Other chronic pain: Secondary | ICD-10-CM | POA: Diagnosis present

## 2018-03-16 DIAGNOSIS — K659 Peritonitis, unspecified: Secondary | ICD-10-CM | POA: Diagnosis present

## 2018-03-16 DIAGNOSIS — I509 Heart failure, unspecified: Secondary | ICD-10-CM | POA: Diagnosis not present

## 2018-03-16 DIAGNOSIS — I10 Essential (primary) hypertension: Secondary | ICD-10-CM | POA: Diagnosis not present

## 2018-03-16 DIAGNOSIS — R100 Acute abdomen: Secondary | ICD-10-CM | POA: Diagnosis present

## 2018-03-16 DIAGNOSIS — R710 Precipitous drop in hematocrit: Secondary | ICD-10-CM | POA: Diagnosis not present

## 2018-03-16 DIAGNOSIS — Z794 Long term (current) use of insulin: Secondary | ICD-10-CM | POA: Diagnosis not present

## 2018-03-16 DIAGNOSIS — I420 Dilated cardiomyopathy: Secondary | ICD-10-CM | POA: Diagnosis not present

## 2018-03-16 DIAGNOSIS — G2581 Restless legs syndrome: Secondary | ICD-10-CM | POA: Diagnosis not present

## 2018-03-16 DIAGNOSIS — K9189 Other postprocedural complications and disorders of digestive system: Secondary | ICD-10-CM

## 2018-03-16 DIAGNOSIS — I110 Hypertensive heart disease with heart failure: Secondary | ICD-10-CM | POA: Diagnosis not present

## 2018-03-16 DIAGNOSIS — Z981 Arthrodesis status: Secondary | ICD-10-CM | POA: Diagnosis not present

## 2018-03-16 DIAGNOSIS — R109 Unspecified abdominal pain: Secondary | ICD-10-CM | POA: Diagnosis not present

## 2018-03-16 DIAGNOSIS — E1340 Other specified diabetes mellitus with diabetic neuropathy, unspecified: Secondary | ICD-10-CM | POA: Diagnosis not present

## 2018-03-16 DIAGNOSIS — K650 Generalized (acute) peritonitis: Secondary | ICD-10-CM | POA: Diagnosis not present

## 2018-03-16 HISTORY — PX: COLON RESECTION: SHX5231

## 2018-03-16 LAB — CBC WITH DIFFERENTIAL/PLATELET
BASOS PCT: 0 %
Basophils Absolute: 0 10*3/uL (ref 0–0.1)
Eosinophils Absolute: 0.1 10*3/uL (ref 0–0.7)
Eosinophils Relative: 1 %
HEMATOCRIT: 33.8 % — AB (ref 35.0–47.0)
HEMOGLOBIN: 11.4 g/dL — AB (ref 12.0–16.0)
Lymphocytes Relative: 12 %
Lymphs Abs: 1.1 10*3/uL (ref 1.0–3.6)
MCH: 30.1 pg (ref 26.0–34.0)
MCHC: 33.8 g/dL (ref 32.0–36.0)
MCV: 89 fL (ref 80.0–100.0)
MONOS PCT: 7 %
Monocytes Absolute: 0.6 10*3/uL (ref 0.2–0.9)
NEUTROS ABS: 7.3 10*3/uL — AB (ref 1.4–6.5)
NEUTROS PCT: 80 %
Platelets: 229 10*3/uL (ref 150–440)
RBC: 3.8 MIL/uL (ref 3.80–5.20)
RDW: 14.1 % (ref 11.5–14.5)
WBC: 9.1 10*3/uL (ref 3.6–11.0)

## 2018-03-16 LAB — COMPREHENSIVE METABOLIC PANEL
ALT: 20 U/L (ref 0–44)
ANION GAP: 5 (ref 5–15)
AST: 27 U/L (ref 15–41)
Albumin: 2.4 g/dL — ABNORMAL LOW (ref 3.5–5.0)
Alkaline Phosphatase: 63 U/L (ref 38–126)
BUN: 8 mg/dL (ref 6–20)
CALCIUM: 8.3 mg/dL — AB (ref 8.9–10.3)
CHLORIDE: 107 mmol/L (ref 98–111)
CO2: 31 mmol/L (ref 22–32)
Creatinine, Ser: 0.83 mg/dL (ref 0.44–1.00)
GFR calc non Af Amer: >60 mL/min (ref >60)
Glucose, Bld: 216 mg/dL — ABNORMAL HIGH (ref 70–99)
POTASSIUM: 3.7 mmol/L (ref 3.5–5.1)
SODIUM: 143 mmol/L (ref 135–145)
Total Bilirubin: 0.5 mg/dL (ref 0.3–1.2)
Total Protein: 5.6 g/dL — ABNORMAL LOW (ref 6.5–8.1)

## 2018-03-16 LAB — GLUCOSE, CAPILLARY
GLUCOSE-CAPILLARY: 128 mg/dL — AB (ref 70–99)
Glucose-Capillary: 165 mg/dL — ABNORMAL HIGH (ref 70–99)
Glucose-Capillary: 254 mg/dL — ABNORMAL HIGH (ref 70–99)

## 2018-03-16 LAB — LIPASE, BLOOD: Lipase: 22 U/L (ref 11–51)

## 2018-03-16 LAB — LACTIC ACID, PLASMA: LACTIC ACID, VENOUS: 1.4 mmol/L (ref 0.5–1.9)

## 2018-03-16 LAB — TYPE AND SCREEN
ABO/RH(D): B POS
ANTIBODY SCREEN: NEGATIVE

## 2018-03-16 SURGERY — COLON RESECTION
Anesthesia: General

## 2018-03-16 MED ORDER — SODIUM CHLORIDE 0.9% FLUSH: 9.0000 mL | INTRAVENOUS | Status: DC | PRN

## 2018-03-16 MED ORDER — SODIUM CHLORIDE 0.9 % IV BOLUS
1000.0000 mL | Freq: Once | INTRAVENOUS | Status: AC
  Administered 2018-03-16: 1000 mL via INTRAVENOUS

## 2018-03-16 MED ORDER — FENTANYL CITRATE (PF) 100 MCG/2ML IJ SOLN
INTRAMUSCULAR | Status: AC
  Filled 2018-03-16: qty 2

## 2018-03-16 MED ORDER — SODIUM CHLORIDE FLUSH 0.9 % IV SOLN
INTRAVENOUS | Status: AC
  Filled 2018-03-16: qty 50

## 2018-03-16 MED ORDER — DIPHENHYDRAMINE HCL 50 MG/ML IJ SOLN: 12.5000 mg | Freq: Four times a day (QID) | INTRAMUSCULAR | Status: DC | PRN

## 2018-03-16 MED ORDER — SUGAMMADEX SODIUM 200 MG/2ML IV SOLN
INTRAVENOUS | Status: DC | PRN
  Administered 2018-03-16: 200 mg via INTRAVENOUS

## 2018-03-16 MED ORDER — ROCURONIUM BROMIDE 100 MG/10ML IV SOLN
INTRAVENOUS | Status: DC | PRN
  Administered 2018-03-16: 50 mg via INTRAVENOUS
  Administered 2018-03-16 (×2): 10 mg via INTRAVENOUS

## 2018-03-16 MED ORDER — CARVEDILOL 25 MG PO TABS
25.0000 mg | ORAL_TABLET | Freq: Two times a day (BID) | ORAL | Status: DC
  Administered 2018-03-16 – 2018-03-17 (×2): 25 mg via ORAL
  Filled 2018-03-16 (×8): qty 1

## 2018-03-16 MED ORDER — FENTANYL CITRATE (PF) 100 MCG/2ML IJ SOLN
100.0000 ug | Freq: Once | INTRAMUSCULAR | Status: AC
  Administered 2018-03-16: 100 ug via INTRAVENOUS

## 2018-03-16 MED ORDER — HEPARIN SODIUM (PORCINE) 5000 UNIT/ML IJ SOLN: 5000.0000 [IU] | Freq: Three times a day (TID) | INTRAMUSCULAR | Status: DC

## 2018-03-16 MED ORDER — FENTANYL CITRATE (PF) 100 MCG/2ML IJ SOLN
25.0000 ug | INTRAMUSCULAR | Status: AC | PRN
  Administered 2018-03-16 (×6): 25 ug via INTRAVENOUS

## 2018-03-16 MED ORDER — FENTANYL CITRATE (PF) 100 MCG/2ML IJ SOLN
INTRAMUSCULAR | Status: DC | PRN
  Administered 2018-03-16 (×2): 50 ug via INTRAVENOUS

## 2018-03-16 MED ORDER — PHENYLEPHRINE HCL 10 MG/ML IJ SOLN
INTRAMUSCULAR | Status: DC | PRN
  Administered 2018-03-16 (×3): 100 ug via INTRAVENOUS

## 2018-03-16 MED ORDER — GABAPENTIN 300 MG PO CAPS
600.0000 mg | ORAL_CAPSULE | Freq: Every day | ORAL | Status: DC
  Administered 2018-03-17 – 2018-03-19 (×3): 600 mg via ORAL
  Filled 2018-03-16 (×5): qty 2

## 2018-03-16 MED ORDER — BUPIVACAINE-EPINEPHRINE (PF) 0.25% -1:200000 IJ SOLN
INTRAMUSCULAR | Status: AC
  Filled 2018-03-16: qty 30

## 2018-03-16 MED ORDER — PIPERACILLIN-TAZOBACTAM 3.375 G IVPB 30 MIN
3.3750 g | Freq: Once | INTRAVENOUS | Status: AC
  Administered 2018-03-16: 3.375 g via INTRAVENOUS
  Filled 2018-03-16: qty 50

## 2018-03-16 MED ORDER — ACETAMINOPHEN 10 MG/ML IV SOLN
INTRAVENOUS | Status: AC
  Filled 2018-03-16: qty 100

## 2018-03-16 MED ORDER — FENTANYL CITRATE (PF) 100 MCG/2ML IJ SOLN
INTRAMUSCULAR | Status: AC
  Administered 2018-03-16: 100 ug via INTRAVENOUS
  Filled 2018-03-16: qty 2

## 2018-03-16 MED ORDER — SODIUM CHLORIDE 0.9 % IV SOLN
INTRAVENOUS | Status: DC | PRN
  Administered 2018-03-16: 09:00:00 via INTRAVENOUS

## 2018-03-16 MED ORDER — HEPARIN SODIUM (PORCINE) 5000 UNIT/ML IJ SOLN
5000.0000 [IU] | Freq: Three times a day (TID) | INTRAMUSCULAR | Status: DC
  Administered 2018-03-16 – 2018-03-21 (×13): 5000 [IU] via SUBCUTANEOUS
  Filled 2018-03-16 (×15): qty 1

## 2018-03-16 MED ORDER — TOPIRAMATE 25 MG PO TABS
50.0000 mg | ORAL_TABLET | ORAL | Status: DC
Start: 2018-03-16 — End: 2018-03-16

## 2018-03-16 MED ORDER — CLONIDINE HCL 0.1 MG PO TABS
0.2000 mg | ORAL_TABLET | Freq: Two times a day (BID) | ORAL | Status: DC
  Administered 2018-03-16 – 2018-03-20 (×2): 0.2 mg via ORAL
  Filled 2018-03-16 (×10): qty 2

## 2018-03-16 MED ORDER — LIDOCAINE HCL (CARDIAC) PF 100 MG/5ML IV SOSY
PREFILLED_SYRINGE | INTRAVENOUS | Status: DC | PRN
  Administered 2018-03-16: 60 mg via INTRAVENOUS

## 2018-03-16 MED ORDER — ONDANSETRON HCL 4 MG/2ML IJ SOLN
INTRAMUSCULAR | Status: DC | PRN
  Administered 2018-03-16: 4 mg via INTRAVENOUS

## 2018-03-16 MED ORDER — LISINOPRIL 20 MG PO TABS
40.0000 mg | ORAL_TABLET | Freq: Every day | ORAL | Status: DC
  Filled 2018-03-16 (×5): qty 2

## 2018-03-16 MED ORDER — GABAPENTIN 300 MG PO CAPS
600.0000 mg | ORAL_CAPSULE | ORAL | Status: DC
Start: 2018-03-16 — End: 2018-03-16

## 2018-03-16 MED ORDER — ACETAMINOPHEN 10 MG/ML IV SOLN
INTRAVENOUS | Status: DC | PRN
  Administered 2018-03-16: 1000 mg via INTRAVENOUS

## 2018-03-16 MED ORDER — HYDROMORPHONE 1 MG/ML IV SOLN
INTRAVENOUS | Status: DC
  Administered 2018-03-16: 3 mg via INTRAVENOUS
  Administered 2018-03-16: 1.5 mg via INTRAVENOUS
  Administered 2018-03-16: 12:00:00 via INTRAVENOUS
  Administered 2018-03-17: 0 mg via INTRAVENOUS
  Filled 2018-03-16: qty 25

## 2018-03-16 MED ORDER — TOPIRAMATE 100 MG PO TABS
100.0000 mg | ORAL_TABLET | Freq: Every day | ORAL | Status: DC
  Administered 2018-03-16 – 2018-03-20 (×2): 100 mg via ORAL
  Filled 2018-03-16 (×6): qty 1

## 2018-03-16 MED ORDER — SUCCINYLCHOLINE CHLORIDE 20 MG/ML IJ SOLN
INTRAMUSCULAR | Status: DC | PRN
  Administered 2018-03-16: 100 mg via INTRAVENOUS

## 2018-03-16 MED ORDER — MORPHINE SULFATE (PF) 4 MG/ML IV SOLN
8.0000 mg | Freq: Once | INTRAVENOUS | Status: AC
  Administered 2018-03-16: 8 mg via INTRAVENOUS
  Filled 2018-03-16: qty 2

## 2018-03-16 MED ORDER — BUPIVACAINE-EPINEPHRINE (PF) 0.5% -1:200000 IJ SOLN
INTRAMUSCULAR | Status: AC
  Filled 2018-03-16: qty 30

## 2018-03-16 MED ORDER — ONDANSETRON HCL 4 MG/2ML IJ SOLN
4.0000 mg | Freq: Four times a day (QID) | INTRAMUSCULAR | Status: DC | PRN
  Administered 2018-03-17 – 2018-03-19 (×4): 4 mg via INTRAVENOUS
  Filled 2018-03-16 (×8): qty 2

## 2018-03-16 MED ORDER — PROPOFOL 10 MG/ML IV BOLUS
INTRAVENOUS | Status: DC | PRN
  Administered 2018-03-16: 150 mg via INTRAVENOUS

## 2018-03-16 MED ORDER — ONDANSETRON HCL 4 MG/2ML IJ SOLN: 4.0000 mg | Freq: Once | INTRAMUSCULAR | Status: DC | PRN

## 2018-03-16 MED ORDER — INSULIN ASPART 100 UNIT/ML ~~LOC~~ SOLN
0.0000 [IU] | Freq: Four times a day (QID) | SUBCUTANEOUS | Status: DC
  Administered 2018-03-16: 5 [IU] via SUBCUTANEOUS
  Administered 2018-03-17 (×2): 2 [IU] via SUBCUTANEOUS
  Administered 2018-03-17: 3 [IU] via SUBCUTANEOUS
  Administered 2018-03-17: 2 [IU] via SUBCUTANEOUS
  Administered 2018-03-18 (×2): 1 [IU] via SUBCUTANEOUS
  Administered 2018-03-18: 2 [IU] via SUBCUTANEOUS
  Administered 2018-03-19 (×2): 1 [IU] via SUBCUTANEOUS
  Administered 2018-03-19 – 2018-03-20 (×3): 2 [IU] via SUBCUTANEOUS
  Administered 2018-03-20: 1 [IU] via SUBCUTANEOUS
  Administered 2018-03-21: 2 [IU] via SUBCUTANEOUS
  Filled 2018-03-16 (×15): qty 1

## 2018-03-16 MED ORDER — TOPIRAMATE 25 MG PO TABS
50.0000 mg | ORAL_TABLET | Freq: Every day | ORAL | Status: DC
Start: 2018-03-16 — End: 2018-03-21
  Administered 2018-03-17: 50 mg via ORAL
  Filled 2018-03-16 (×6): qty 2

## 2018-03-16 MED ORDER — ONDANSETRON HCL 4 MG/2ML IJ SOLN
4.0000 mg | Freq: Once | INTRAMUSCULAR | Status: AC
  Administered 2018-03-16: 4 mg via INTRAVENOUS
  Filled 2018-03-16: qty 2

## 2018-03-16 MED ORDER — ONDANSETRON HCL 4 MG/2ML IJ SOLN
INTRAMUSCULAR | Status: AC
  Filled 2018-03-16: qty 2

## 2018-03-16 MED ORDER — LACTATED RINGERS IV SOLN
INTRAVENOUS | Status: DC
  Administered 2018-03-16: 09:00:00 via INTRAVENOUS

## 2018-03-16 MED ORDER — ONDANSETRON HCL 4 MG/2ML IJ SOLN
4.0000 mg | Freq: Four times a day (QID) | INTRAMUSCULAR | Status: DC | PRN
  Administered 2018-03-16 – 2018-03-17 (×2): 4 mg via INTRAVENOUS

## 2018-03-16 MED ORDER — SUGAMMADEX SODIUM 200 MG/2ML IV SOLN
INTRAVENOUS | Status: AC
  Filled 2018-03-16: qty 2

## 2018-03-16 MED ORDER — KCL IN DEXTROSE-NACL 10-5-0.45 MEQ/L-%-% IV SOLN
INTRAVENOUS | Status: DC
  Administered 2018-03-16 – 2018-03-19 (×8): via INTRAVENOUS
  Filled 2018-03-16 (×11): qty 1000

## 2018-03-16 MED ORDER — DIPHENHYDRAMINE HCL 12.5 MG/5ML PO ELIX
12.5000 mg | ORAL_SOLUTION | Freq: Four times a day (QID) | ORAL | Status: DC | PRN
  Filled 2018-03-16: qty 5

## 2018-03-16 MED ORDER — ONDANSETRON HCL 4 MG PO TABS
4.0000 mg | ORAL_TABLET | Freq: Four times a day (QID) | ORAL | Status: DC | PRN
  Administered 2018-03-17 – 2018-03-18 (×2): 4 mg via ORAL
  Filled 2018-03-16 (×2): qty 1

## 2018-03-16 MED ORDER — ROCURONIUM BROMIDE 100 MG/10ML IV SOLN
INTRAVENOUS | Status: AC
  Filled 2018-03-16: qty 1

## 2018-03-16 MED ORDER — SODIUM CHLORIDE 0.9 % IV SOLN
INTRAVENOUS | Status: DC | PRN
  Administered 2018-03-16: 70 mL

## 2018-03-16 MED ORDER — LIDOCAINE HCL (PF) 2 % IJ SOLN
INTRAMUSCULAR | Status: AC
  Filled 2018-03-16: qty 10

## 2018-03-16 MED ORDER — DEXAMETHASONE SODIUM PHOSPHATE 10 MG/ML IJ SOLN
INTRAMUSCULAR | Status: AC
  Filled 2018-03-16: qty 1

## 2018-03-16 MED ORDER — BUPIVACAINE-EPINEPHRINE 0.5% -1:200000 IJ SOLN
INTRAMUSCULAR | Status: DC | PRN
  Administered 2018-03-16: 30 mL

## 2018-03-16 MED ORDER — SUCCINYLCHOLINE CHLORIDE 20 MG/ML IJ SOLN
INTRAMUSCULAR | Status: AC
  Filled 2018-03-16: qty 1

## 2018-03-16 MED ORDER — NALOXONE HCL 0.4 MG/ML IJ SOLN: 0.4000 mg | INTRAMUSCULAR | Status: DC | PRN

## 2018-03-16 MED ORDER — DEXAMETHASONE SODIUM PHOSPHATE 10 MG/ML IJ SOLN
INTRAMUSCULAR | Status: DC | PRN
  Administered 2018-03-16: 10 mg via INTRAVENOUS

## 2018-03-16 MED ORDER — GABAPENTIN 400 MG PO CAPS
1200.0000 mg | ORAL_CAPSULE | Freq: Every day | ORAL | Status: DC
  Administered 2018-03-16 – 2018-03-19 (×4): 1200 mg via ORAL
  Filled 2018-03-16 (×5): qty 3

## 2018-03-16 MED ORDER — BUPIVACAINE LIPOSOME 1.3 % IJ SUSP
INTRAMUSCULAR | Status: AC
  Filled 2018-03-16: qty 20

## 2018-03-16 MED ORDER — PROPOFOL 10 MG/ML IV BOLUS
INTRAVENOUS | Status: AC
  Filled 2018-03-16: qty 20

## 2018-03-16 MED ORDER — IOHEXOL 300 MG/ML  SOLN
100.0000 mL | Freq: Once | INTRAMUSCULAR | Status: AC | PRN
  Administered 2018-03-16: 100 mL via INTRAVENOUS

## 2018-03-16 SURGICAL SUPPLY — 49 items
ADHESIVE MASTISOL STRL (MISCELLANEOUS) IMPLANT
CANISTER SUCT 1200ML W/VALVE (MISCELLANEOUS) ×2 IMPLANT
CHLORAPREP W/TINT 26ML (MISCELLANEOUS) IMPLANT
COVER CLAMP SIL LG PBX B (MISCELLANEOUS) IMPLANT
DRAIN PENROSE 1/4X12 LTX (DRAIN) ×2 IMPLANT
DRAPE LAPAROTOMY 100X77 ABD (DRAPES) ×2 IMPLANT
DRAPE LEGGINS SURG 28X43 STRL (DRAPES) IMPLANT
DRSG OPSITE POSTOP 4X10 (GAUZE/BANDAGES/DRESSINGS) IMPLANT
DRSG OPSITE POSTOP 4X8 (GAUZE/BANDAGES/DRESSINGS) IMPLANT
DRSG TELFA 3X8 NADH (GAUZE/BANDAGES/DRESSINGS) IMPLANT
ELECT BLADE 6.5 EXT (BLADE) ×2 IMPLANT
ELECT CAUTERY BLADE 6.4 (BLADE) ×2 IMPLANT
ELECT REM PT RETURN 9FT ADLT (ELECTROSURGICAL) ×2
ELECTRODE REM PT RTRN 9FT ADLT (ELECTROSURGICAL) ×1 IMPLANT
GAUZE SPONGE 4X4 12PLY STRL (GAUZE/BANDAGES/DRESSINGS) ×4 IMPLANT
GLOVE BIO SURGEON STRL SZ8 (GLOVE) ×8 IMPLANT
GLOVE INDICATOR 8.0 STRL GRN (GLOVE) ×8 IMPLANT
GOWN STRL REUS W/ TWL LRG LVL3 (GOWN DISPOSABLE) ×4 IMPLANT
GOWN STRL REUS W/ TWL XL LVL3 (GOWN DISPOSABLE) ×2 IMPLANT
GOWN STRL REUS W/TWL LRG LVL3 (GOWN DISPOSABLE) ×4
GOWN STRL REUS W/TWL XL LVL3 (GOWN DISPOSABLE) ×2
KIT TURNOVER KIT A (KITS) ×2 IMPLANT
LABEL OR SOLS (LABEL) ×2 IMPLANT
NEEDLE HYPO 22GX1.5 SAFETY (NEEDLE) ×2 IMPLANT
NS IRRIG 1000ML POUR BTL (IV SOLUTION) ×2 IMPLANT
PACK BASIN MAJOR ARMC (MISCELLANEOUS) ×2 IMPLANT
PACK COLON CLEAN CLOSURE (MISCELLANEOUS) IMPLANT
RELOAD PROXIMATE 30MM BLUE (ENDOMECHANICALS) IMPLANT
RELOAD STAPLER LINEAR PROX 30 (STAPLE) IMPLANT
SCRUB POVIDONE IODINE 4 OZ (MISCELLANEOUS) ×2 IMPLANT
SEPRAFILM MEMBRANE 5X6 (MISCELLANEOUS) IMPLANT
SET YANKAUER POOLE SUCT (MISCELLANEOUS) ×2 IMPLANT
SPONGE LAP 18X18 RF (DISPOSABLE) ×2 IMPLANT
STAPLER PROXIMATE 75MM BLUE (STAPLE) IMPLANT
STAPLER RELOAD LINEAR PROX 30 (STAPLE)
STAPLER SKIN PROX 35W (STAPLE) ×2 IMPLANT
STRIP CLOSURE SKIN 1/2X4 (GAUZE/BANDAGES/DRESSINGS) IMPLANT
SURGILUBE 2OZ TUBE FLIPTOP (MISCELLANEOUS) ×2 IMPLANT
SUT MNCRL 3-0 UNDYED SH (SUTURE) ×2 IMPLANT
SUT MONOCRYL 3-0 UNDYED (SUTURE) ×2
SUT PDS AB 1 CT1 27 (SUTURE) ×2 IMPLANT
SUT PDS AB 1 TP1 54 (SUTURE) ×4 IMPLANT
SUT SILK 0 (SUTURE) ×1
SUT SILK 0 30XBRD TIE 6 (SUTURE) ×1 IMPLANT
SUT SILK 3-0 (SUTURE) ×10 IMPLANT
SUT VIC AB 1 CTX 27 (SUTURE) ×6 IMPLANT
SUT VICRYL 0 TIES 12 18 (SUTURE) ×4 IMPLANT
SYR 10ML LL (SYRINGE) ×2 IMPLANT
TRAY FOLEY MTR SLVR 16FR STAT (SET/KITS/TRAYS/PACK) ×2 IMPLANT

## 2018-03-16 NOTE — Anesthesia Postprocedure Evaluation (Signed)
Anesthesia Post Note  Patient: Marissa [name]  Procedure(s) Performed: COLON RESECTION (N/A )  Patient location during evaluation: PACU Anesthesia Type: General Level of consciousness: awake and alert Pain management: pain level controlled Vital Signs Assessment: post-procedure vital signs reviewed and stable Respiratory status: spontaneous breathing and respiratory function stable Cardiovascular status: stable Anesthetic complications: no     Last Vitals:  Vitals:   03/16/18 1300 03/16/18 1317  BP: (!) 126/97 129/87  Pulse: (!) 104 100  Resp:  12  Temp:  36.9 C  SpO2:  100%    Last Pain:  Vitals:   03/16/18 1317  TempSrc: Oral  PainSc:                  Climmie Cronce K

## 2018-03-16 NOTE — Progress Notes (Signed)
Dr. Burt Knack at bedside to speak with patient.

## 2018-03-16 NOTE — H&P (Signed)
Marissa [name] is an 42 y.o. female.    Chief Complaint: Abdominal pain  HPI: This patient presented to the emergency room with acute onset of severe abdominal pain.  It happened last night approximately 5 PM.  She has been in the ER all evening.  She is had no nausea vomiting no fevers or chills had a normal bowel movement with flatus both yesterday and this morning.  I was called by the emergency room physician with signs of an acute abdomen.  CT scan confirms.  Of significance is that on 26 July the patient was taken the operating room for an elective laparoscopic assisted hysterectomy for chronic pelvic pain.  A bowel injury was discovered at that time and Dr. Windell Moment was consulted to repair bowel injury and assist in adhesio lysis.  I have read his operative report.  A section of small bowel needed to be resected as was the appendix.  There were such severe adhesions that the hysterectomy could not be completed and the uterus was left in situ.  Hysterectomy was abandoned.  There was also understood by the operative report that the adhesions were so severe that mobilization of the small bowel was difficult as well.  She states she has had lots of problems with antibiotics and called them allergies initially but most of them are diarrhea related and only one, ciprofloxacin, because her lip swelling likely a true allergy.  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  Past Medical History:  Diagnosis Date  . Allergy   . Anemia   . Anxiety   . Arthritis   . Back pain, chronic   . Cardiomyopathy, dilated, nonischemic ([place])   . CHF (congestive heart failure) ([place])   . Cholelithiasis   . Coronary artery disease   . Depression   . Diabetes mellitus without complication (HCC)    fasting cbg 50-140s  . Dysrhythmia    tachycardia  . GERD (gastroesophageal reflux disease)    otc meds  . Headache(784.0)   . Hypercholesteremia   . Hyperlipemia   . Hypertension   . Insulin  pump in place    pt had insulin pump but it is now removed (02-18-16)  . MI, old    2006  . Migraines    once/month maybe - can last up to two weeks  . Myocardial infarction [facility]) 2006   "due to medication"; no evidence of ischemia or infarction by nuclear stress test '11  . Neck pain, chronic   . Neuropathy   . Pneumonia 2015   [facility]  . Prolonged QT interval syndrome   . Renal insufficiency   . Restless leg syndrome, controlled   . Sepsis ([place]) 2016   secondary to surgery  . Sleep apnea    does not use cpap since losing alot of weight  . Vision loss    due to diabetes    Past Surgical History:  Procedure Laterality Date  . APPENDECTOMY N/A 03/08/2018   Procedure: APPENDECTOMY;  Surgeon: Benjaman Kindler, MD;  Location: [facility] ORS;  Service: Gynecology;  Laterality: N/A;  By Dr. Windell Moment  . BACK SURGERY  2011   Lumbar   . BOWEL RESECTION N/A 03/08/2018   Procedure: SMALL BOWEL RESECTION;  Surgeon: Benjaman Kindler, MD;  Location: [facility] ORS;  Service: Gynecology;  Laterality: N/A;  . CARDIAC CATHETERIZATION  07/18/2004   50-60% mid LAD, minor luminal irregularities RCA, normal LM and CX, EF 50-55% [facility])  . carpel tunnel Bilateral   .  CERVICAL FUSION  2006  . CHOLECYSTECTOMY N/A 04/08/2015   Procedure: LAPAROSCOPIC CHOLECYSTECTOMY WITH INTRAOPERATIVE CHOLANGIOGRAM;  Surgeon: Florene Glen, MD;  Location: [facility] ORS;  Service: General;  Laterality: N/A;  . LUMBAR LAMINECTOMY/DECOMPRESSION MICRODISCECTOMY Right 05/07/2013   Procedure: Right Lumbar one-two laminectomy;  Surgeon: Elaina Hoops, MD;  Location: [place] NEURO ORS;  Service: Neurosurgery;  Laterality: Right;  . LUMBAR LAMINECTOMY/DECOMPRESSION MICRODISCECTOMY Left 10/29/2013   Procedure: Left Lumbar five-Sacral one Laminectomy;  Surgeon: Elaina Hoops, MD;  Location: [place] NEURO ORS;  Service: Neurosurgery;  Laterality: Left;  . LUMBAR WOUND DEBRIDEMENT N/A 01/25/2014   Procedure: LUMBAR WOUND DEBRIDEMENT;  Surgeon:  Charlie Pitter, MD;  Location: [place] NEURO ORS;  Service: Neurosurgery;  Laterality: N/A;  . LUMBAR WOUND DEBRIDEMENT N/A 02/25/2014   Procedure: LUMBAR WOUND DEBRIDEMENT;  Surgeon: Elaina Hoops, MD;  Location: [place] NEURO ORS;  Service: Neurosurgery;  Laterality: N/A;  . LYSIS OF ADHESION N/A 03/08/2018   Procedure: LYSIS OF ADHESION;  Surgeon: Benjaman Kindler, MD;  Location: [facility] ORS;  Service: Gynecology;  Laterality: N/A;  . SALPINGOOPHORECTOMY Bilateral    1 1997. 2nd 2001  . SHOULDER ARTHROSCOPY WITH BICEPSTENOTOMY Left 03/24/2016   Procedure: shoulder arthroscopy with biceps TENOTOMY, removal loose body, limited synovectomy;  Surgeon: Leanor Kail, MD;  Location: [facility] ORS;  Service: Orthopedics;  Laterality: Left;  . shoulder sugery     7/17 rotator cuff  . SMALL BOWEL REPAIR N/A 03/08/2018   Procedure: SMALL BOWEL REPAIR;  Surgeon: Benjaman Kindler, MD;  Location: [facility] ORS;  Service: Gynecology;  Laterality: N/A;    Family History  Problem Relation Age of Onset  . Depression Mother   . Drug abuse Mother   . Early death Mother   . Hypertension Mother   . Varicose Veins Mother   . Diabetes Father   . Early death Father   . Hyperlipidemia Father   . Heart Problems Father        enlarged geart  . Breast cancer Maternal Grandmother        60's  . Breast cancer Maternal Aunt    Social History:  reports that she has never smoked. She has never used smokeless tobacco. She reports that she does not drink alcohol or use drugs.  Allergies:  Allergies  Allergen Reactions  . Bactrim [Sulfamethoxazole-Trimethoprim] Nausea And Vomiting  . Ciprofloxacin Swelling  . Vancomycin Nausea And Vomiting  . Amoxicillin Nausea And Vomiting    Has patient had a PCN reaction causing immediate rash, facial/tongue/throat swelling, SOB or lightheadedness with hypotension: No Has patient had a PCN reaction causing severe rash involving mucus membranes or skin necrosis: No Has patient had a PCN reaction that  required hospitalization: No Has patient had a PCN reaction occurring within the last 10 years: Yes If all of the above answers are "NO", then may proceed with Cephalosporin use.   . Ceftriaxone Nausea And Vomiting  . Azithromycin Itching and Rash  . Food Itching and Rash    "Greek yogurt"     (Not in a hospital admission)   Review of Systems  Constitutional: Negative for chills and fever.  HENT: Negative.   Eyes: Negative.   Respiratory: Negative.   Cardiovascular: Negative.   Gastrointestinal: Positive for abdominal pain. Negative for blood in stool, constipation, diarrhea, heartburn, melena, nausea and vomiting.  Genitourinary: Negative.   Musculoskeletal: Negative.   Skin: Negative.   Neurological: Negative.   Endo/Heme/Allergies: Negative.   Psychiatric/Behavioral: Negative.  Physical Exam:  LMP 12/21/2014 (Approximate)   Physical Exam  Constitutional: She appears well-developed and well-nourished.  Non-toxic appearance. She appears ill. She appears distressed.  HENT:  Head: Normocephalic and atraumatic.  Eyes: Pupils are equal, round, and reactive to light. EOM are normal.  Cardiovascular: Normal rate and regular rhythm.  Pulmonary/Chest: Effort normal and breath sounds normal. No stridor. She has no rales.  Abdominal: She exhibits distension. There is generalized tenderness. There is rigidity, rebound and guarding. No hernia.  Open granulating wound in the caudad portion of the midline incision.  No purulence noted  Skin: Skin is warm and dry. She is not diaphoretic.  Vitals reviewed.       Results for orders placed or performed during the hospital encounter of 03/16/18 (from the past 48 hour(s))  Comprehensive metabolic panel     Status: Abnormal   Collection Time: 03/16/18  5:04 AM  Result Value Ref Range   Sodium 143 135 - 145 mmol/L   Potassium 3.7 3.5 - 5.1 mmol/L   Chloride 107 98 - 111 mmol/L   CO2 31 22 - 32 mmol/L   Glucose, Bld 216 (H)  70 - 99 mg/dL   BUN 8 6 - 20 mg/dL   Creatinine, Ser 0.83 0.44 - 1.00 mg/dL   Calcium 8.3 (L) 8.9 - 10.3 mg/dL   Total Protein 5.6 (L) 6.5 - 8.1 g/dL   Albumin 2.4 (L) 3.5 - 5.0 g/dL   AST 27 15 - 41 U/L   ALT 20 0 - 44 U/L   Alkaline Phosphatase 63 38 - 126 U/L   Total Bilirubin 0.5 0.3 - 1.2 mg/dL   GFR calc non Af Amer >60 >60 mL/min   GFR calc Af Amer >60 >60 mL/min    Comment: (NOTE) The eGFR has been calculated using the CKD EPI equation. This calculation has not been validated in all clinical situations. eGFR's persistently <60 mL/min signify possible Chronic Kidney Disease.    Anion gap 5 5 - 15    Comment: Performed at [facility], [address] [place], [place] [address]  CBC with Differential     Status: Abnormal   Collection Time: 03/16/18  5:04 AM  Result Value Ref Range   WBC 9.1 3.6 - 11.0 K/uL   RBC 3.80 3.80 - 5.20 MIL/uL   Hemoglobin 11.4 (L) 12.0 - 16.0 g/dL   HCT 33.8 (L) 35.0 - 47.0 %   MCV 89.0 80.0 - 100.0 fL   MCH 30.1 26.0 - 34.0 pg   MCHC 33.8 32.0 - 36.0 g/dL   RDW 14.1 11.5 - 14.5 %   Platelets 229 150 - 440 K/uL   Neutrophils Relative % 80 %   Neutro Abs 7.3 (H) 1.4 - 6.5 K/uL   Lymphocytes Relative 12 %   Lymphs Abs 1.1 1.0 - 3.6 K/uL   Monocytes Relative 7 %   Monocytes Absolute 0.6 0.2 - 0.9 K/uL   Eosinophils Relative 1 %   Eosinophils Absolute 0.1 0 - 0.7 K/uL   Basophils Relative 0 %   Basophils Absolute 0.0 0 - 0.1 K/uL    Comment: Performed at [facility], [address] [place], [place] [address]  Lipase, blood     Status: None   Collection Time: 03/16/18  5:04 AM  Result Value Ref Range   Lipase 22 11 - 51 U/L    Comment: Performed at [facility], [address] [place], [place] [address]  Lactic acid, plasma  Status: None   Collection Time: 03/16/18  5:04 AM  Result Value Ref Range   Lactic Acid, Venous 1.4 0.5 - 1.9 mmol/L    Comment: Performed at [facility], [address], [place], [place] [address]   Ct Abdomen Pelvis W Contrast  Result Date: 03/16/2018 CLINICAL DATA:  Abdominal pain today. Small bowel resection and repair with lysis of adhesions and appendectomy 1 week prior. EXAM: CT ABDOMEN AND PELVIS WITH CONTRAST TECHNIQUE: Multidetector CT imaging of the abdomen and pelvis was performed using the standard protocol following bolus administration of intravenous contrast. CONTRAST:  134m OMNIPAQUE IOHEXOL 300 MG/ML  SOLN COMPARISON:  Postoperative CT 03/12/2018 FINDINGS: Lower chest: Small bilateral pleural effusions, decreased in size from prior exam with adjacent atelectasis. Hepatobiliary: Focal fatty infiltration adjacent to the falciform ligament. Tiny focus of air in the liver appears intraparenchymal rather than in the biliary tree or portal venous system. Prior cholecystectomy. No biliary dilatation. Pancreas: Parenchymal atrophy. No ductal dilatation or inflammation. Spleen: Normal in size without focal abnormality. Adrenals/Urinary Tract: No adrenal nodule. No hydronephrosis or perinephric edema. Homogeneous renal enhancement with symmetric excretion on delayed phase imaging. Urinary bladder is physiologically distended without wall thickening. Stomach/Bowel: Partial sleeve gastrectomy. Moderate length segment of abnormal small bowel with diffuse wall thickening, mucosal edema, and mesenteric edema and fluid. This involves the bowel immediately proximal to enteric sutures in the central pelvis. Transition point in the tear exude yours, the more distal small bowel is decompressed. No definite pneumatosis. Enteric contrast throughout the colon from prior CT last week. No colonic wall thickening or inflammation. Vascular/Lymphatic: Aortic atherosclerosis. Incidental duplicated IVC. Prominent iliac and bilateral inguinal lymph nodes. Reproductive: Atrophic uterus versus prior hysterectomy with prominent vaginal cuff. Other: Persistent free air in the abdomen and  pelvis, moderate in degree in the right upper quadrant. Scattered free air throughout the mesentery and anterior abdomen. Moderate volume of free fluid throughout the abdomen and pelvis. Inflammatory changes in the lower anterior abdominal wall with postsurgical change and scattered air, lower aspect of the surgical wound appears open. Diffuse subcutaneous edema in the flanks. Musculoskeletal: There are no acute or suspicious osseous abnormalities. IMPRESSION: 1. One-week post lysis of adhesion and small-bowel resection/repair. Moderate volume of free air and free fluid, greater than expected for 1 week postop. Small bowel immediately proximal to the surgical anastomosis in the pelvis is markedly abnormal with mucosal edema, wall thickening and inflammatory change. Considerations include inflammation or ischemia, given degree of free air, anastomotic leak is suspected. 2. Small focus of air within the right lobe of the liver, of uncertain etiology. No evidence of surrounding fluid collection or abscess. Recommend continued follow-up. 3. Lower anterior abdominal wound, open to the skin inferiorly without focal subcutaneous abscess. 4. Chronic findings as described. Critical Value/emergent preliminary results were discussed by telephone at the time of interpretation on 03/16/2018 at 6:41 am to Dr. NDarel Hong, who verbally acknowledged these results. Electronically Signed   By: MJeb LeveringM.D.   On: 03/16/2018 06:48     Assessment/Plan  Labs reviewed CT scan personally reviewed.  Discussed with emergency room physician multiple times.  Patient obviously has an acute abdomen.  And while this could possibly represent an anastomotic leak regardless of the cause she requires an emergency exploratory laparotomy.  I described for the patient the rationale for offering this procedure and the need for the procedure.  I also discussed the inability to diagnose the exact cause based on CT scan.  That meaning that  the treatment would not be be determined until in the operating room.  This might require extensive adhesio lysis, bowel resection, anastomosis, or even an an ostomy bag.  His ostomy bag could be temporary or permanent and it was discussed with her in that light.  Questions were answered for her she understood and agreed to proceed.  This was also discussed with nursing.  I discussed this with the operating room who is getting ready to start a very short case and we will proceed right after that.  Florene Glen, MD, FACS

## 2018-03-16 NOTE — Anesthesia Procedure Notes (Signed)
Procedure Name: Intubation Date/Time: 03/16/2018 9:05 AM Performed by: Milus Height, CRNA Pre-anesthesia Checklist: Patient identified, Patient being monitored, Timeout performed, Emergency Drugs available and Suction available Patient Re-evaluated:Patient Re-evaluated prior to induction Oxygen Delivery Method: Circle system utilized Preoxygenation: Pre-oxygenation with 100% oxygen Induction Type: IV induction, Rapid sequence and Cricoid Pressure applied Laryngoscope Size: Mac and 3 Grade View: Grade I Tube type: Oral Tube size: 7.0 mm Number of attempts: 1 Airway Equipment and Method: Stylet Placement Confirmation: ETT inserted through vocal cords under direct vision,  positive ETCO2 and breath sounds checked- equal and bilateral Secured at: 20 cm Tube secured with: Tape Dental Injury: Teeth and Oropharynx as per pre-operative assessment

## 2018-03-16 NOTE — ED Notes (Signed)
The fourth attempt at a Type and screen sent at this time. OR Tech Josh at bedside.

## 2018-03-16 NOTE — ED Notes (Signed)
Pt refuses NG tube and states that it can only be inserted once she is asleep

## 2018-03-16 NOTE — ED Triage Notes (Signed)
Pt bib [facility] for post-op complication related to attempted hysterectomy-(reported bowel clipped in process and resection as result, hysterectomy unable to be completed per pt). Pt states expected post op soreness until tonight at 5pm, nothing abnormal to cause it, but sudden onset sever pain to lower abdomen. Sharp-to cramping. Pt writhing, face displaying obvious tremendous discomfort. Pt received 75 mcg fentanyl in route and reports minimal relief. A&O upon arrival.

## 2018-03-16 NOTE — ED Notes (Signed)
Dr Burt Knack at bedside to explain procedure and expected events to come

## 2018-03-16 NOTE — Progress Notes (Signed)
15 minute call to floor. 

## 2018-03-16 NOTE — ED Notes (Signed)
Report called to OR Charge, orderly to retrieve pt from ED

## 2018-03-16 NOTE — Progress Notes (Signed)
Preoperative Review   Patient is met in the preoperative holding area. The history is reviewed in the chart and with the patient. I personally reviewed the options and rationale as well as the risks of this procedure that have been previously discussed with the patient. All questions asked by the patient  were answered to their satisfaction.  Inform patient that Dr. Windell Moment will be present and available for the operation as well.  Patient agrees to proceed with this procedure at this time.  Florene Glen M.D. FACS

## 2018-03-16 NOTE — Op Note (Signed)
03/16/2018 Marissa [name]  Pre-operative Diagnosis: Acute abdomen  Post-operative Diagnosis: Small bowel obstruction secondary to internal hernia  Procedure: Exploratory laparotomy, extensive adhesio lysis, reduction of internal hernia  Surgeon: Jerrol Banana. Burt Knack, MD FACS  Anesthesia: Gen. with endotracheal tube  Assistant: Dr. Herbert Pun.  His presence was necessary and appreciated because of his prior knowledge of this patient's hospital abdomen and in performing needed retraction and dissection aid.  Procedure Details  The patient was seen again in the Holding Room. The benefits, complications, treatment options, and expected outcomes were discussed with the patient. The risks of bleeding, infection, recurrence of symptoms, failure to resolve symptoms,  bowel injury, any of which could require further surgery were reviewed with the patient.   The patient was taken to Operating Room, identified as Marissa [name] and the procedure verified.  A Time Out was held and the above information confirmed.  Prior to the induction of general anesthesia, antibiotic prophylaxis was administered. VTE prophylaxis was in place. General endotracheal anesthesia was then administered and tolerated well. After the induction, the abdomen was prepped with Chloraprep and draped in the sterile fashion. The patient was positioned in the supine position.  The open area of the midline incision was opened further and then the skin was extended cephalad.  An open area without adhesions was identified and the abdominal cavity was found to contain serous fluid only there was no sign of succus no foul odor.  There was no obvious gush of free air.  Proxy 1 hour was spent with extensive adhesio lysis to free up the recent adhesions around the anastomosis.  Dr. Darrol Poke assistance and prior knowledge of this patient's hospital abdomen was instrumental in being able to sort out the anatomy and to then run the  bowel multiple times from the ligament of Treitz to the ileocecal valve.  First time the bowel was run and it was noted that there was markedly thickened bowel and an apparent loop that was involved in an internal hernia from adhesion in the upper abdomen.  This was well away from the anastomosis.  There is no sign of an anastomotic leak.  There is no sign of a missed full-thickness injury either.  No enterotomies were made.  After further dissection the bowel was run again and found to be patent as was the anastomosis.  Once being assured that there was no sign of succus leak sepsis etc. the abdominal wound was closed with running number 1  PDS and Exparel was placed.  Skin staples were placed over a Penrose drain.  The sponge lap needle count was correct and the estimated blood loss was 25 cc.  Patient was taken to recovery room in stable condition to be admitted for continued care.  Findings: Internal hernia involving the small bowel proximal.  No sign of anastomotic leak etc.   Estimated Blood Loss: 25 cc         Drains: None         Specimens: Perineal fluid cultures          Complications: None              Condition: Stable   [name] E. Burt Knack, MD, FACS

## 2018-03-16 NOTE — Transfer of Care (Signed)
Immediate Anesthesia Transfer of Care Note  Patient: Marissa [name]  Procedure(s) Performed: COLON RESECTION (N/A )  Patient Location: PACU  Anesthesia Type:General  Level of Consciousness: awake, alert , oriented and patient cooperative  Airway & Oxygen Therapy: Patient Spontanous Breathing and Patient connected to nasal cannula oxygen  Post-op Assessment: Report given to RN, Post -op Vital signs reviewed and stable and Patient moving all extremities  Post vital signs: Reviewed and stable  Last Vitals:  Vitals Value Taken Time  BP 141/69 03/16/2018 10:46 AM  Temp 37.1 C 03/16/2018 10:45 AM  Pulse 103 03/16/2018 10:48 AM  Resp 12 03/16/2018 10:48 AM  SpO2 99 % 03/16/2018 10:48 AM  Vitals shown include unvalidated device data.  Last Pain:  Vitals:   03/16/18 0734  TempSrc:   PainSc: 10-Worst pain ever         Complications: No apparent anesthesia complications

## 2018-03-16 NOTE — Anesthesia Post-op Follow-up Note (Signed)
Anesthesia QCDR form completed.        

## 2018-03-16 NOTE — Anesthesia Preprocedure Evaluation (Signed)
Anesthesia Evaluation  Patient identified by MRN, date of birth, ID band Patient awake    Reviewed: Allergy & Precautions, NPO status , Patient's Chart, lab work & pertinent test results  History of Anesthesia Complications Negative for: history of anesthetic complications  Airway Mallampati: II       Dental   Pulmonary sleep apnea (not using CPAP) , neg COPD,           Cardiovascular hypertension, Pt. on medications + Past MI and +CHF  + dysrhythmias (prolonged QT) (-) Valvular Problems/Murmurs     Neuro/Psych neg Seizures Anxiety Depression    GI/Hepatic Neg liver ROS, GERD  Medicated,  Endo/Other  diabetes, Type 2, Oral Hypoglycemic Agents  Renal/GU Renal InsufficiencyRenal disease     Musculoskeletal   Abdominal   Peds  Hematology  (+) anemia ,   Anesthesia Other Findings   Reproductive/Obstetrics                             Anesthesia Physical Anesthesia Plan  ASA: III and emergent  Anesthesia Plan: General   Post-op Pain Management:    Induction: Intravenous  PONV Risk Score and Plan: 3 and Dexamethasone, Ondansetron and Midazolam  Airway Management Planned: Oral ETT  Additional Equipment:   Intra-op Plan:   Post-operative Plan:   Informed Consent: I have reviewed the patients History and Physical, chart, labs and discussed the procedure including the risks, benefits and alternatives for the proposed anesthesia with the patient or authorized representative who has indicated his/her understanding and acceptance.     Plan Discussed with:   Anesthesia Plan Comments:         Anesthesia Quick Evaluation

## 2018-03-16 NOTE — ED Provider Notes (Signed)
[facility] Emergency Department Provider Note  ____________________________________________   First MD Initiated Contact with Patient 03/16/18 [phone]     (approximate)  I have reviewed the triage vital signs and the nursing notes.   HISTORY  Chief Complaint Post-op Problem and Abdominal Pain   HPI Marissa [name] is a 42 y.o. female who comes to the emergency department by EMS with sudden onset severe diffuse abdominal pain that began around 6 PM roughly 10 hours prior to arrival.  The patient had surgery on July 26 roughly 1 week ago which was initially planned to be a hysterectomy however during the procedure she had a bowel perforation.  She subsequently had a bowel resection and primary anastomosis and initially did well postoperatively until today.  Today she had sudden onset severe pain.  The pain is diffuse abdominal sharp stabbing associated with nausea.  Nonradiating.  Is worse with any sort of movement whatsoever and nothing seems to make it much better.  She was given 75 mcg of fentanyl in route with minimal relief.    Past Medical History:  Diagnosis Date  . Allergy   . Anemia   . Anxiety   . Arthritis   . Back pain, chronic   . Cardiomyopathy, dilated, nonischemic ([place])   . CHF (congestive heart failure) ([place])   . Cholelithiasis   . Coronary artery disease   . Depression   . Diabetes mellitus without complication (HCC)    fasting cbg 50-140s  . Dysrhythmia    tachycardia  . GERD (gastroesophageal reflux disease)    otc meds  . Headache(784.0)   . Hypercholesteremia   . Hyperlipemia   . Hypertension   . Insulin pump in place    pt had insulin pump but it is now removed (02-18-16)  . MI, old    2006  . Migraines    once/month maybe - can last up to two weeks  . Myocardial infarction [facility]) 2006   "due to medication"; no evidence of ischemia or infarction by nuclear stress test '11  . Neck pain, chronic   . Neuropathy   . Pneumonia  2015   [facility]  . Prolonged QT interval syndrome   . Renal insufficiency   . Restless leg syndrome, controlled   . Sepsis ([place]) 2016   secondary to surgery  . Sleep apnea    does not use cpap since losing alot of weight  . Vision loss    due to diabetes    Patient Active Problem List   Diagnosis Date Noted  . Anastomotic leak of intestine   . Peritonitis ([place])   . Pelvic pain 03/08/2018  . Paresthesia 09/06/2016  . Cardiomyopathy ([place]) 04/23/2015  . Long term current use of antibiotics 04/23/2015  . Biliary colic 89/38/1017  . Calculus of gallbladder with acute cholecystitis 04/07/2015  . Bilateral occipital neuralgia 01/21/2015  . Dizziness 01/04/2015  . Headache 01/04/2015  . DDD (degenerative disc disease), cervical 12/18/2014  . DDD (degenerative disc disease), thoracic 12/18/2014  . DDD (degenerative disc disease), lumbosacral 12/18/2014  . Neuropathy due to secondary diabetes ([place]) 12/18/2014  . Migraine 12/18/2014  . Hypercholesterolemia without hypertriglyceridemia 08/28/2014  . HPV test positive 07/17/2014  . Class 2 obesity 04/15/2014  . CAD in native artery 04/15/2014  . Breathlessness on exertion 03/24/2014  . Wound infection complicating hardware ([place]) 02/25/2014  . Nausea alone 02/01/2014  . Abdominal pain, unspecified site 02/01/2014  . Severe sepsis(995.92) 01/30/2014  . History of lumbar  laminectomy 01/30/2014  . Acute renal failure ([place]) 01/30/2014  . Anemia 01/30/2014  . HTN (hypertension) 01/30/2014  . DM (diabetes mellitus), type 2 with renal complications ([place]) 68/03/8109  . Wound infection 01/25/2014  . Acute respiratory failure with hypoxia ([place]) 01/25/2014  . Type 2 diabetes mellitus treated with insulin ([place]) 01/13/2014  . Essential (primary) hypertension 01/13/2014  . Difficulty speaking 01/13/2014  . Clinical depression 01/13/2014  . Back pain 11/01/2013  . HNP (herniated nucleus pulposus), lumbar 10/29/2013    Past Surgical History:    Procedure Laterality Date  . APPENDECTOMY N/A 03/08/2018   Procedure: APPENDECTOMY;  Surgeon: Benjaman Kindler, MD;  Location: [facility] ORS;  Service: Gynecology;  Laterality: N/A;  By Dr. Windell Moment  . BACK SURGERY  2011   Lumbar   . BOWEL RESECTION N/A 03/08/2018   Procedure: SMALL BOWEL RESECTION;  Surgeon: Benjaman Kindler, MD;  Location: [facility] ORS;  Service: Gynecology;  Laterality: N/A;  . CARDIAC CATHETERIZATION  07/18/2004   50-60% mid LAD, minor luminal irregularities RCA, normal LM and CX, EF 50-55% [facility])  . carpel tunnel Bilateral   . CERVICAL FUSION  2006  . CHOLECYSTECTOMY N/A 04/08/2015   Procedure: LAPAROSCOPIC CHOLECYSTECTOMY WITH INTRAOPERATIVE CHOLANGIOGRAM;  Surgeon: Florene Glen, MD;  Location: [facility] ORS;  Service: General;  Laterality: N/A;  . LUMBAR LAMINECTOMY/DECOMPRESSION MICRODISCECTOMY Right 05/07/2013   Procedure: Right Lumbar one-two laminectomy;  Surgeon: Elaina Hoops, MD;  Location: [place] [facility] ORS;  Service: Neurosurgery;  Laterality: Right;  . LUMBAR LAMINECTOMY/DECOMPRESSION MICRODISCECTOMY Left 10/29/2013   Procedure: Left Lumbar five-Sacral one Laminectomy;  Surgeon: Elaina Hoops, MD;  Location: [place] [facility] ORS;  Service: Neurosurgery;  Laterality: Left;  . LUMBAR WOUND DEBRIDEMENT N/A 01/25/2014   Procedure: LUMBAR WOUND DEBRIDEMENT;  Surgeon: Charlie Pitter, MD;  Location: [place] [facility] ORS;  Service: Neurosurgery;  Laterality: N/A;  . LUMBAR WOUND DEBRIDEMENT N/A 02/25/2014   Procedure: LUMBAR WOUND DEBRIDEMENT;  Surgeon: Elaina Hoops, MD;  Location: [place] [facility] ORS;  Service: Neurosurgery;  Laterality: N/A;  . LYSIS OF ADHESION N/A 03/08/2018   Procedure: LYSIS OF ADHESION;  Surgeon: Benjaman Kindler, MD;  Location: [facility] ORS;  Service: Gynecology;  Laterality: N/A;  . SALPINGOOPHORECTOMY Bilateral    1 1997. 2nd 2001  . SHOULDER ARTHROSCOPY WITH BICEPSTENOTOMY Left 03/24/2016   Procedure: shoulder arthroscopy with biceps TENOTOMY, removal loose body,  limited synovectomy;  Surgeon: Leanor Kail, MD;  Location: [facility] ORS;  Service: Orthopedics;  Laterality: Left;  . shoulder sugery     7/17 rotator cuff  . SMALL BOWEL REPAIR N/A 03/08/2018   Procedure: SMALL BOWEL REPAIR;  Surgeon: Benjaman Kindler, MD;  Location: [facility] ORS;  Service: Gynecology;  Laterality: N/A;    Prior to Admission medications   Medication Sig Start Date End Date Taking? Authorizing Provider  acetaminophen (TYLENOL) 500 MG tablet Take 1,000 mg by mouth 2 (two) times daily as needed for moderate pain or headache.   Yes [provider]  amitriptyline (ELAVIL) 50 MG tablet Take 50 mg by mouth at bedtime.   Yes [provider]  atorvastatin (LIPITOR) 40 MG tablet Take 40 mg by mouth at bedtime.  11/11/17  Yes [provider]  BIOTIN PO Take 1 tablet by mouth daily.   Yes [provider]  butalbital-acetaminophen-caffeine (FIORICET, ESGIC) 50-325-40 MG tablet Take 1 tablet by mouth 2 (two) times daily as needed for migraine.  11/06/17  Yes [provider]  carvedilol (COREG) 25 MG tablet Take 1 tablet (  25 mg total) by mouth 2 (two) times daily with a meal. 02/04/14  Yes Buriev, Arie Sabina, MD  Cholecalciferol (VITAMIN D3) 50000 units TABS Take 500,000 Units by mouth 2 (two) times a week.   Yes [provider]  cloNIDine (CATAPRES) 0.2 MG tablet Take 0.2 mg by mouth 2 (two) times daily. 02/10/16  Yes [provider]  cyanocobalamin (,VITAMIN B-12,) 1000 MCG/ML injection Inject 1,000 mcg into the muscle every 30 (thirty) days. 12/25/17  Yes [provider]  docusate sodium (COLACE) 100 MG capsule Take 1 capsule (100 mg total) by mouth 2 (two) times daily. 03/13/18  Yes Herbert Pun, MD  gabapentin (NEURONTIN) 300 MG capsule Take 600-1,200 mg by mouth See admin instructions. Take 600 mg in the morning and afternoon, then take 1200 mg at bedtime   Yes [provider]  insulin aspart (NOVOLOG FLEXPEN)  100 UNIT/ML FlexPen Inject 10-20 Units into the skin 3 (three) times daily with meals. Sliding scale Patient taking differently: Inject 2-6 Units into the skin daily with supper. Sliding scale 02/04/14  Yes Buriev, Arie Sabina, MD  levocetirizine (XYZAL) 5 MG tablet Take 5 mg by mouth daily as needed for allergies.   Yes [provider]  lisinopril (PRINIVIL,ZESTRIL) 40 MG tablet Take 40 mg by mouth daily.   Yes [provider]  meclizine (ANTIVERT) 25 MG tablet Take 1 tablet (25 mg total) by mouth 3 (three) times daily as needed for dizziness. 12/12/17  Yes Hedges, Dellis Filbert, PA-C  ondansetron (ZOFRAN ODT) 4 MG disintegrating tablet Take 1 tablet (4 mg total) by mouth every 6 (six) hours as needed for nausea. 03/13/18  Yes Benjaman Kindler, MD  OVER THE COUNTER MEDICATION Take 1 tablet by mouth daily. Mag O7 otc supplement   Yes [provider]  oxycodone (ROXICODONE) 30 MG immediate release tablet Take 15 mg by mouth every 4 (four) hours as needed for pain.   Yes [provider]  oxyCODONE-acetaminophen (PERCOCET) 10-325 MG tablet Take 1 tablet by mouth every 4 (four) hours as needed for pain. 03/13/18  Yes Benjaman Kindler, MD  rOPINIRole (REQUIP) 0.5 MG tablet Take 0.5-1.5 mg by mouth See admin instructions. Take 0.5 mg in the morning and 1.5 mg at night   Yes [provider]  tiZANidine (ZANAFLEX) 4 MG tablet Limit 1 tab by mouth per day or 2-3 times per day if tolerated Patient taking differently: Take 4 mg by mouth 3 (three) times daily.  04/19/16  Yes Mohammed Kindle, MD  topiramate (TOPAMAX) 50 MG tablet Take 50-100 mg by mouth See admin instructions. Take 50 mg in the morning and 100 mg at night   Yes [provider]  traZODone (DESYREL) 50 MG tablet Take 50 mg by mouth at bedtime.  11/26/17  Yes [provider]  lidocaine (LIDODERM) 5 % Place 1 patch onto the skin every 12 (twelve) hours. Remove & Discard patch within 12 hours or as directed  by MD Patient not taking: Reported on 02/25/2018 07/04/17 07/04/18  Laban Emperor, PA-C  meloxicam (MOBIC) 15 MG tablet Take 1 tablet (15 mg total) by mouth daily. Patient not taking: Reported on 12/12/2017 11/26/17   Cuthriell, Charline Bills, PA-C  NARCAN 4 MG/0.1ML LIQD nasal spray kit CALL 911. INJECT SPRAY INTO NOSTRIL. IF NO RESPONSE AFTER 2 MIN REPEAT IN OPPOSITE NOSTRIL. 01/15/18   [provider]  nitroGLYCERIN (NITROSTAT) 0.4 MG SL tablet Place 0.4 mg under the tongue every 5 (five) minutes as needed for chest  pain.    [provider]    Allergies Bactrim [sulfamethoxazole-trimethoprim]; Ciprofloxacin; Vancomycin; Amoxicillin; Azithromycin; Ceftriaxone; and Food  Family History  Problem Relation Age of Onset  . Depression Mother   . Drug abuse Mother   . Early death Mother   . Hypertension Mother   . Varicose Veins Mother   . Diabetes Father   . Early death Father   . Hyperlipidemia Father   . Heart Problems Father        enlarged geart  . Breast cancer Maternal Grandmother        60's  . Breast cancer Maternal Aunt     Social History Social History   Tobacco Use  . Smoking status: Never Smoker  . Smokeless tobacco: Never Used  Substance Use Topics  . Alcohol use: No  . Drug use: No    Review of Systems Constitutional: No fever/chills Eyes: No visual changes. ENT: No sore throat. Cardiovascular: Denies chest pain. Respiratory: Denies shortness of breath. Gastrointestinal: Positive for abdominal pain.  Positive for nausea, no vomiting.  No diarrhea.  No constipation. Genitourinary: Negative for dysuria. Musculoskeletal: Negative for back pain. Skin: Negative for rash. Neurological: Negative for headaches, focal weakness or numbness.   ____________________________________________   PHYSICAL EXAM:  VITAL SIGNS: ED Triage Vitals  Enc Vitals Group     BP      Pulse      Resp      Temp      Temp src      SpO2      Weight      Height       Head Circumference      Peak Flow      Pain Score      Pain Loc      Pain Edu?      Excl. in [place]?     Constitutional: Writhing in bed and appears exquisitely uncomfortable crying in pain Eyes: PERRL EOMI. Head: Atraumatic. Nose: No congestion/rhinnorhea. Mouth/Throat: No trismus Neck: No stridor.   Cardiovascular: Tachycardic rate, regular rhythm. Grossly normal heart sounds.  Good peripheral circulation. Respiratory: Increased respiratory effort.  No retractions. Lungs CTAB and moving good air Gastrointestinal: Diffuse peritonitis.  Exquisite discomfort with even light percussion in all 4 quadrants. Musculoskeletal: No lower extremity edema   Neurologic:  Normal speech and language. No gross focal neurologic deficits are appreciated. Skin:  Skin is warm, dry and intact. No rash noted. Psychiatric: Appears extremely uncomfortable tearful  ____________________________________________   DIFFERENTIAL includes but not limited to  Bowel perforation, intra-abdominal abscess, free air ____________________________________________   LABS (all labs ordered are listed, but only abnormal results are displayed)  Labs Reviewed  COMPREHENSIVE METABOLIC PANEL - Abnormal; Notable for the following components:      Result Value   Glucose, Bld 216 (*)    Calcium 8.3 (*)    Total Protein 5.6 (*)    Albumin 2.4 (*)    All other components within normal limits  CBC WITH DIFFERENTIAL/PLATELET - Abnormal; Notable for the following components:   Hemoglobin 11.4 (*)    HCT 33.8 (*)    Neutro Abs 7.3 (*)    All other components within normal limits  CULTURE, BLOOD (ROUTINE X 2)  CULTURE, BLOOD (ROUTINE X 2)  LIPASE, BLOOD  LACTIC ACID, PLASMA  LACTIC ACID, PLASMA  TYPE AND SCREEN    Lab work reviewed by me with low albumin consistent with chronicly poor nutrition __________________________________________  EKG   ____________________________________________  RADIOLOGY  CT abdomen  pelvis reviewed by me concerning for bowel perforation with free air and free fluid ____________________________________________   PROCEDURES  Procedure(s) performed: Yes  .Critical Care Performed by: Darel Hong, MD Authorized by: Darel Hong, MD   Critical care provider statement:    Critical care time (minutes):  45   Critical care time was exclusive of:  Separately billable procedures and treating other patients   Critical care was necessary to treat or prevent imminent or life-threatening deterioration of the following conditions:  Sepsis (abdominal catastrophe)   Critical care was time spent personally by me on the following activities:  Development of treatment plan with patient or surrogate, discussions with consultants, evaluation of patient's response to treatment, examination of patient, obtaining history from patient or surrogate, ordering and performing treatments and interventions, ordering and review of laboratory studies, ordering and review of radiographic studies, pulse oximetry, re-evaluation of patient's condition and review of old charts   Angiocath insertion Performed by: Darel Hong  Consent: Verbal consent obtained. Risks and benefits: risks, benefits and alternatives were discussed Time out: Immediately prior to procedure a "time out" was called to verify the correct patient, procedure, equipment, support staff and site/side marked as required.  Preparation: Patient was prepped and draped in the usual sterile fashion.  Vein Location: right AC  Ultrasound Guided  Gauge: 18  Normal blood return and flush without difficulty Patient tolerance: Patient tolerated the procedure well with no immediate complications.     Critical Care performed: yes  ____________________________________________   INITIAL IMPRESSION / ASSESSMENT AND PLAN / ED COURSE  Pertinent labs & imaging results that were available during my care of the patient were  reviewed by me and considered in my medical decision making (see chart for details).   As part of my medical decision making, I reviewed the following data within the [place] History obtained from family if available, nursing notes, old chart and ekg, as well as notes from prior ED visits.  The patient arrives with frank peritonitis in the immediate postoperative period.  Given 75 mcg of fentanyl in route with essentially no pain relief so we will give him 100 mcg of fentanyl now.  While labs and CT scan are pending I reached out to on-call general surgeon Dr. Burt Knack who agrees with the course of action and requests a call back when the CT scan is complete.     ----------------------------------------- 4:45 AM on 03/16/2018 -----------------------------------------  I spoke with Dr. Burt Knack who will kindly come evaluate the patient.  He agrees with IV Zosyn now.  The patient's pain was improved initially after a second dose of fentanyl but has now returned.  8 mg of IV morphine now in addition to Zofran.  Dr. Burt Knack recommends emergent surgical exploration and the patient agrees. ____________________________________________   FINAL CLINICAL IMPRESSION(S) / ED DIAGNOSES  Final diagnoses:  Peritonitis ([place])  Anastomotic leak of intestine      NEW MEDICATIONS STARTED DURING THIS VISIT:  New Prescriptions   No medications on file     Note:  This document was prepared using Dragon voice recognition software and may include unintentional dictation errors.     Darel Hong, MD 03/16/18 [phone]

## 2018-03-16 NOTE — ED Notes (Signed)
Pt undressed, all pt belongings placed in white pt belongings bag. Pt has black framed prescription  glasses in clear bag wrapped in washcloth with white label on inside of bag. Pt had partial that was placed in pink denture cup with pt white label. Grey tennis shoes placed in white belonging back along with white tee shirt black zip up sweat hoody, black underwear with white PJ pants with light blue design on them.

## 2018-03-17 ENCOUNTER — Encounter: Payer: Self-pay | Admitting: Surgery

## 2018-03-17 LAB — GLUCOSE, CAPILLARY
GLUCOSE-CAPILLARY: 194 mg/dL — AB (ref 70–99)
GLUCOSE-CAPILLARY: 241 mg/dL — AB (ref 70–99)
Glucose-Capillary: 164 mg/dL — ABNORMAL HIGH (ref 70–99)
Glucose-Capillary: 166 mg/dL — ABNORMAL HIGH (ref 70–99)
Glucose-Capillary: 174 mg/dL — ABNORMAL HIGH (ref 70–99)

## 2018-03-17 LAB — BASIC METABOLIC PANEL
ANION GAP: 6 (ref 5–15)
BUN: 8 mg/dL (ref 6–20)
CHLORIDE: 106 mmol/L (ref 98–111)
CO2: 30 mmol/L (ref 22–32)
CREATININE: 0.77 mg/dL (ref 0.44–1.00)
Calcium: 8.1 mg/dL — ABNORMAL LOW (ref 8.9–10.3)
GFR calc Af Amer: >60 mL/min (ref >60)
GFR calc non Af Amer: >60 mL/min (ref >60)
GLUCOSE: 217 mg/dL — AB (ref 70–99)
Potassium: 4.3 mmol/L (ref 3.5–5.1)
Sodium: 142 mmol/L (ref 135–145)

## 2018-03-17 LAB — CBC
HCT: 28.1 % — ABNORMAL LOW (ref 35.0–47.0)
HEMOGLOBIN: 9.6 g/dL — AB (ref 12.0–16.0)
MCH: 30.5 pg (ref 26.0–34.0)
MCHC: 34.3 g/dL (ref 32.0–36.0)
MCV: 89.1 fL (ref 80.0–100.0)
Platelets: 208 10*3/uL (ref 150–440)
RBC: 3.15 MIL/uL — ABNORMAL LOW (ref 3.80–5.20)
RDW: 14.2 % (ref 11.5–14.5)
WBC: 10.4 10*3/uL (ref 3.6–11.0)

## 2018-03-17 MED ORDER — LACTATED RINGERS IV BOLUS
1000.0000 mL | Freq: Once | INTRAVENOUS | Status: AC
  Administered 2018-03-17: 1000 mL via INTRAVENOUS

## 2018-03-17 MED ORDER — PHENOL 1.4 % MT LIQD
1.0000 | OROMUCOSAL | Status: DC | PRN
  Administered 2018-03-17 (×2): 1 via OROMUCOSAL
  Filled 2018-03-17: qty 177

## 2018-03-17 MED ORDER — HYDROMORPHONE HCL 1 MG/ML IJ SOLN
0.5000 mg | INTRAMUSCULAR | Status: DC | PRN
  Administered 2018-03-17 – 2018-03-20 (×10): 0.5 mg via INTRAVENOUS
  Filled 2018-03-17 (×4): qty 0.5

## 2018-03-17 MED ORDER — OXYCODONE-ACETAMINOPHEN 5-325 MG PO TABS
1.0000 | ORAL_TABLET | ORAL | Status: DC | PRN
  Administered 2018-03-17 – 2018-03-20 (×9): 2 via ORAL
  Administered 2018-03-20: 1 via ORAL
  Administered 2018-03-21 (×2): 2 via ORAL
  Filled 2018-03-17 (×4): qty 2
  Filled 2018-03-17: qty 1
  Filled 2018-03-17 (×7): qty 2

## 2018-03-17 NOTE — Progress Notes (Signed)
Pt with temp of 100.5, MD notified, no orders given at this time.

## 2018-03-17 NOTE — Progress Notes (Signed)
Notified Dr Burt Knack of pt's low BP. Orders given for fluid bolus and to D/c PCA. Pt tolerating IV fluid bolus as well as PRN pain medication. Pt denies pain, states she does not really want to take pain medication if at all possible.

## 2018-03-17 NOTE — Progress Notes (Signed)
1 Day Post-Op  Subjective: Status post exploratory laparotomy for what proved to be an internal hernia with bowel obstruction.  Patient feels much better today stating that her preoperative pain is completely gone and her incisional pain is manageable.  She is using her PCA.  She has no nausea and started to pass some gas.  Objective: Vital signs in last 24 hours: Temp:  [98 F (36.7 C)-98.8 F (37.1 C)] 98.4 F (36.9 C) (08/04 0454) Pulse Rate:  [84-108] 84 (08/04 0454) Resp:  [10-20] 13 (08/04 0804) BP: (104-147)/(69-100) 121/83 (08/04 0454) SpO2:  [92 %-100 %] 98 % (08/04 0804) Last BM Date: 03/14/18  Intake/Output from previous day: 08/03 0701 - 08/04 0700 In: 3398.3 [I.V.:3298.3; IV Piggyback:100] Out: 1005 [Urine:830; Emesis/NG output:50; Blood:25] Intake/Output this shift: No intake/output data recorded.  Physical exam:  Vital signs are reviewed and stable. Abdomen distended minimally tympanitic wound is clean with Penrose drain in place minimally tender. Nontender calves  Lab Results: CBC  Recent Labs    03/16/18 0504 03/17/18 0632  WBC 9.1 10.4  HGB 11.4* 9.6*  HCT 33.8* 28.1*  PLT 229 208   BMET Recent Labs    03/16/18 0504 03/17/18 0632  NA 143 142  K 3.7 4.3  CL 107 106  CO2 31 30  GLUCOSE 216* 217*  BUN 8 8  CREATININE 0.83 0.77  CALCIUM 8.3* 8.1*   PT/INR No results for input(s): LABPROT, INR in the last 72 hours. ABG No results for input(s): PHART, HCO3 in the last 72 hours.  Invalid input(s): PCO2, PO2  Studies/Results: Ct Abdomen Pelvis W Contrast  Result Date: 03/16/2018 CLINICAL DATA:  Abdominal pain today. Small bowel resection and repair with lysis of adhesions and appendectomy 1 week prior. EXAM: CT ABDOMEN AND PELVIS WITH CONTRAST TECHNIQUE: Multidetector CT imaging of the abdomen and pelvis was performed using the standard protocol following bolus administration of intravenous contrast. CONTRAST:  172mL OMNIPAQUE IOHEXOL 300 MG/ML   SOLN COMPARISON:  Postoperative CT 03/12/2018 FINDINGS: Lower chest: Small bilateral pleural effusions, decreased in size from prior exam with adjacent atelectasis. Hepatobiliary: Focal fatty infiltration adjacent to the falciform ligament. Tiny focus of air in the liver appears intraparenchymal rather than in the biliary tree or portal venous system. Prior cholecystectomy. No biliary dilatation. Pancreas: Parenchymal atrophy. No ductal dilatation or inflammation. Spleen: Normal in size without focal abnormality. Adrenals/Urinary Tract: No adrenal nodule. No hydronephrosis or perinephric edema. Homogeneous renal enhancement with symmetric excretion on delayed phase imaging. Urinary bladder is physiologically distended without wall thickening. Stomach/Bowel: Partial sleeve gastrectomy. Moderate length segment of abnormal small bowel with diffuse wall thickening, mucosal edema, and mesenteric edema and fluid. This involves the bowel immediately proximal to enteric sutures in the central pelvis. Transition point in the tear exude yours, the more distal small bowel is decompressed. No definite pneumatosis. Enteric contrast throughout the colon from prior CT last week. No colonic wall thickening or inflammation. Vascular/Lymphatic: Aortic atherosclerosis. Incidental duplicated IVC. Prominent iliac and bilateral inguinal lymph nodes. Reproductive: Atrophic uterus versus prior hysterectomy with prominent vaginal cuff. Other: Persistent free air in the abdomen and pelvis, moderate in degree in the right upper quadrant. Scattered free air throughout the mesentery and anterior abdomen. Moderate volume of free fluid throughout the abdomen and pelvis. Inflammatory changes in the lower anterior abdominal wall with postsurgical change and scattered air, lower aspect of the surgical wound appears open. Diffuse subcutaneous edema in the flanks. Musculoskeletal: There are no acute or suspicious osseous abnormalities.  IMPRESSION: 1.  One-week post lysis of adhesion and small-bowel resection/repair. Moderate volume of free air and free fluid, greater than expected for 1 week postop. Small bowel immediately proximal to the surgical anastomosis in the pelvis is markedly abnormal with mucosal edema, wall thickening and inflammatory change. Considerations include inflammation or ischemia, given degree of free air, anastomotic leak is suspected. 2. Small focus of air within the right lobe of the liver, of uncertain etiology. No evidence of surrounding fluid collection or abscess. Recommend continued follow-up. 3. Lower anterior abdominal wound, open to the skin inferiorly without focal subcutaneous abscess. 4. Chronic findings as described. Critical Value/emergent preliminary results were discussed by telephone at the time of interpretation on 03/16/2018 at 6:41 am to Dr. Darel Hong , who verbally acknowledged these results. Electronically Signed   By: Jeb Levering M.D.   On: 03/16/2018 06:48    Anti-infectives: Anti-infectives (From admission, onward)   Start     Dose/Rate Route Frequency Ordered Stop   03/16/18 0615  piperacillin-tazobactam (ZOSYN) IVPB 3.375 g     3.375 g 100 mL/hr over 30 Minutes Intravenous  Once 03/16/18 0601 03/16/18 1555      Assessment/Plan: s/p Procedure(s): COLON RESECTION   Patient doing very well following exploratory laparotomy for bowel obstruction.  She feels much better but has incisional pain at this time.  We will discontinue her nasogastric tube and give clear liquids only at this time.  Await full bowel function return.  H&H fell slightly postoperatively and will observe that for right now.  There was minimal if any bleeding during the operation and suspicion for intra-abdominal bleeding is very low.  Florene Glen, MD, FACS  03/17/2018

## 2018-03-18 LAB — CBC WITH DIFFERENTIAL/PLATELET
Basophils Absolute: 0 10*3/uL (ref 0–0.1)
Basophils Relative: 0 %
Eosinophils Absolute: 0.1 10*3/uL (ref 0–0.7)
Eosinophils Relative: 1 %
HEMATOCRIT: 24.7 % — AB (ref 35.0–47.0)
Hemoglobin: 8.4 g/dL — ABNORMAL LOW (ref 12.0–16.0)
LYMPHS ABS: 1.9 10*3/uL (ref 1.0–3.6)
LYMPHS PCT: 22 %
MCH: 30.2 pg (ref 26.0–34.0)
MCHC: 33.9 g/dL (ref 32.0–36.0)
MCV: 89.1 fL (ref 80.0–100.0)
MONO ABS: 0.7 10*3/uL (ref 0.2–0.9)
MONOS PCT: 8 %
Neutro Abs: 6 10*3/uL (ref 1.4–6.5)
Neutrophils Relative %: 69 %
Platelets: 199 10*3/uL (ref 150–440)
RBC: 2.77 MIL/uL — ABNORMAL LOW (ref 3.80–5.20)
RDW: 14.3 % (ref 11.5–14.5)
WBC: 8.7 10*3/uL (ref 3.6–11.0)

## 2018-03-18 LAB — GLUCOSE, CAPILLARY
Glucose-Capillary: 134 mg/dL — ABNORMAL HIGH (ref 70–99)
Glucose-Capillary: 134 mg/dL — ABNORMAL HIGH (ref 70–99)
Glucose-Capillary: 112 mg/dL — ABNORMAL HIGH (ref 70–99)

## 2018-03-18 MED ORDER — HYDROMORPHONE HCL 1 MG/ML IJ SOLN
INTRAMUSCULAR | Status: AC
  Filled 2018-03-18: qty 1

## 2018-03-18 MED ORDER — HYDROMORPHONE HCL 1 MG/ML IJ SOLN
INTRAMUSCULAR | Status: AC
  Administered 2018-03-18: 0.5 mg via INTRAVENOUS
  Filled 2018-03-18: qty 1

## 2018-03-18 NOTE — Progress Notes (Signed)
CRITICAL VALUE ALERT  Critical Value:  Anaerobic/Aerobic fluid culture  Date & Time Notied:  03/18/18 @ 1119   Provider Notified: Pabon MD  Orders Received/Actions taken: no orders at this time

## 2018-03-18 NOTE — Progress Notes (Signed)
2 Days Post-Op  Subjective: Patient status post exploratory laparotomy for internal hernia and bowel obstruction.  There was no sign of an acute abdomen or enteric leak at the time of the surgery.  She feels much better today if she does question the cause of some occasional cramping abdominal pain.  She has incisional pain but otherwise she states that she is is feeling much better than when she came in the hospital.  In fact she asked when she can go home.  She is on clear liquids at this time.  She has not passed any gas and has not had a bowel movement yet.  Her hemoglobin dropped again today.  She had a single episode of low-grade fever last night.  Objective: Vital signs in last 24 hours: Temp:  [98 F (36.7 C)-100.5 F (38.1 C)] 98.3 F (36.8 C) (08/05 0542) Pulse Rate:  [76-89] 76 (08/05 0542) Resp:  [13-15] 14 (08/05 0542) BP: (76-104)/(47-67) 99/67 (08/05 0542) SpO2:  [86 %-100 %] 99 % (08/05 0542) Last BM Date: 03/14/18  Intake/Output from previous day: 08/04 0701 - 08/05 0700 In: 0  Out: 100 [Urine:100] Intake/Output this shift: No intake/output data recorded.  Physical exam:  Vital signs reviewed single episode low-grade temp to 100.5 last night.  Heart rate normal Awake alert oriented and in no acute distress Abdomen is soft minimally distended nontender except around the incision.  The incision is clean without purulence or erythema and Penrose drains are in place. Calves are nontender  Lab Results: CBC  Recent Labs    03/17/18 0632 03/18/18 0445  WBC 10.4 8.7  HGB 9.6* 8.4*  HCT 28.1* 24.7*  PLT 208 199   BMET Recent Labs    03/16/18 0504 03/17/18 0632  NA 143 142  K 3.7 4.3  CL 107 106  CO2 31 30  GLUCOSE 216* 217*  BUN 8 8  CREATININE 0.83 0.77  CALCIUM 8.3* 8.1*   PT/INR No results for input(s): LABPROT, INR in the last 72 hours. ABG No results for input(s): PHART, HCO3 in the last 72 hours.  Invalid input(s): PCO2,  PO2  Studies/Results: No results found.  Anti-infectives: Anti-infectives (From admission, onward)   Start     Dose/Rate Route Frequency Ordered Stop   03/16/18 0615  piperacillin-tazobactam (ZOSYN) IVPB 3.375 g     3.375 g 100 mL/hr over 30 Minutes Intravenous  Once 03/16/18 0601 03/16/18 1555      Assessment/Plan: s/p Procedure(s): COLON RESECTION   Drop in hemoglobin today.  Unclear of the etiology of this is a patient appears to be doing quite well.  I see no obvious site of bleeding and intraoperatively there was no sign of bleeding nor was there any anastomosis or any likely source to look back at.  If she continues to drop she may require a transfusion.  Her vital signs are currently stable however.  Source of the low-grade fever is unclear as well.  She wants to advance diet and go home but she has not yet passed gas or had a bowel movement.  I would leave her on clears until her bowel function returns.  Florene Glen, MD, FACS  03/18/2018

## 2018-03-19 LAB — CBC WITH DIFFERENTIAL/PLATELET
BASOS PCT: 1 %
Basophils Absolute: 0.1 10*3/uL (ref 0–0.1)
Eosinophils Absolute: 0.1 10*3/uL (ref 0–0.7)
Eosinophils Relative: 2 %
HCT: 31.7 % — ABNORMAL LOW (ref 35.0–47.0)
HEMOGLOBIN: 10.3 g/dL — AB (ref 12.0–16.0)
Lymphocytes Relative: 21 %
Lymphs Abs: 1.3 10*3/uL (ref 1.0–3.6)
MCH: 29.1 pg (ref 26.0–34.0)
MCHC: 32.5 g/dL (ref 32.0–36.0)
MCV: 89.4 fL (ref 80.0–100.0)
MONOS PCT: 7 %
Monocytes Absolute: 0.4 10*3/uL (ref 0.2–0.9)
NEUTROS ABS: 4.5 10*3/uL (ref 1.4–6.5)
NEUTROS PCT: 69 %
Platelets: 238 10*3/uL (ref 150–440)
RBC: 3.55 MIL/uL — ABNORMAL LOW (ref 3.80–5.20)
RDW: 14.4 % (ref 11.5–14.5)
WBC: 6.4 10*3/uL (ref 3.6–11.0)

## 2018-03-19 LAB — GLUCOSE, CAPILLARY
Glucose-Capillary: 128 mg/dL — ABNORMAL HIGH (ref 70–99)
Glucose-Capillary: 139 mg/dL — ABNORMAL HIGH (ref 70–99)
Glucose-Capillary: 148 mg/dL — ABNORMAL HIGH (ref 70–99)
Glucose-Capillary: 181 mg/dL — ABNORMAL HIGH (ref 70–99)

## 2018-03-19 LAB — COMPREHENSIVE METABOLIC PANEL
ALBUMIN: 2.4 g/dL — AB (ref 3.5–5.0)
ALK PHOS: 58 U/L (ref 38–126)
ALT: 15 U/L (ref 0–44)
ANION GAP: 5 (ref 5–15)
AST: 20 U/L (ref 15–41)
BUN: 7 mg/dL (ref 6–20)
CO2: 30 mmol/L (ref 22–32)
Calcium: 8.3 mg/dL — ABNORMAL LOW (ref 8.9–10.3)
Chloride: 106 mmol/L (ref 98–111)
Creatinine, Ser: 0.95 mg/dL (ref 0.44–1.00)
GFR calc Af Amer: >60 mL/min (ref >60)
GFR calc non Af Amer: >60 mL/min (ref >60)
GLUCOSE: 153 mg/dL — AB (ref 70–99)
POTASSIUM: 4.4 mmol/L (ref 3.5–5.1)
Sodium: 141 mmol/L (ref 135–145)
Total Bilirubin: 0.4 mg/dL (ref 0.3–1.2)
Total Protein: 5.4 g/dL — ABNORMAL LOW (ref 6.5–8.1)

## 2018-03-19 LAB — HIV ANTIBODY (ROUTINE TESTING W REFLEX): HIV SCREEN 4TH GENERATION: NONREACTIVE

## 2018-03-19 MED ORDER — TORSEMIDE 20 MG PO TABS
20.0000 mg | ORAL_TABLET | Freq: Every day | ORAL | Status: DC
  Administered 2018-03-19: 20 mg via ORAL
  Filled 2018-03-19 (×3): qty 1

## 2018-03-19 MED ORDER — TRAZODONE HCL 50 MG PO TABS
50.0000 mg | ORAL_TABLET | Freq: Every day | ORAL | Status: DC
  Administered 2018-03-19 – 2018-03-20 (×2): 50 mg via ORAL
  Filled 2018-03-19 (×2): qty 1

## 2018-03-19 MED ORDER — ROPINIROLE HCL 1 MG PO TABS
0.5000 mg | ORAL_TABLET | Freq: Every day | ORAL | Status: DC
  Administered 2018-03-19: 0.5 mg via ORAL
  Filled 2018-03-19 (×3): qty 1

## 2018-03-19 MED ORDER — SODIUM CHLORIDE 0.9 % IV BOLUS
1000.0000 mL | Freq: Once | INTRAVENOUS | Status: AC
  Administered 2018-03-19: 1000 mL via INTRAVENOUS

## 2018-03-19 MED ORDER — DOCUSATE SODIUM 100 MG PO CAPS
100.0000 mg | ORAL_CAPSULE | Freq: Two times a day (BID) | ORAL | Status: DC
  Administered 2018-03-19 – 2018-03-20 (×2): 100 mg via ORAL
  Filled 2018-03-19 (×5): qty 1

## 2018-03-19 MED ORDER — HYDROMORPHONE HCL 1 MG/ML IJ SOLN
INTRAMUSCULAR | Status: AC
  Filled 2018-03-19: qty 1

## 2018-03-19 MED ORDER — TIZANIDINE HCL 4 MG PO TABS
4.0000 mg | ORAL_TABLET | Freq: Three times a day (TID) | ORAL | Status: DC
  Administered 2018-03-19 – 2018-03-20 (×3): 4 mg via ORAL
  Filled 2018-03-19 (×8): qty 1

## 2018-03-19 MED ORDER — ROPINIROLE HCL 1 MG PO TABS
1.5000 mg | ORAL_TABLET | Freq: Every day | ORAL | Status: DC
  Administered 2018-03-20: 1.5 mg via ORAL
  Filled 2018-03-19 (×2): qty 2

## 2018-03-19 NOTE — Care Management Important Message (Signed)
Copy of signed IM left with patient in room.  

## 2018-03-19 NOTE — Progress Notes (Signed)
3 Days Post-Op  Subjective: Status post exploratory laparotomy for internal hernia.  Patient doing very well tolerating a full liquid diet.  Has no complaints and is passing gas.  Abdominal fluid cultures noted.  Objective: Vital signs in last 24 hours: Temp:  [98.4 F (36.9 C)-98.5 F (36.9 C)] 98.5 F (36.9 C) (08/06 0450) Pulse Rate:  [79-94] 94 (08/06 0832) Resp:  [18-20] 20 (08/06 0450) BP: (104-126)/(70-78) 120/78 (08/06 0832) SpO2:  [97 %-99 %] 97 % (08/06 0450) Last BM Date: 03/14/18  Intake/Output from previous day: 08/05 0701 - 08/06 0700 In: 2083.3 [P.O.:240; I.V.:1843.3] Out: 500 [Urine:500] Intake/Output this shift: No intake/output data recorded.  Physical exam:  Abdomen is soft minimally distended nontender wound is clean no erythema no drainage Penrose drain in place.  Calves are nontender.  Patient up and walking.  Lab Results: CBC  Recent Labs    03/18/18 0445 03/19/18 0435  WBC 8.7 6.4  HGB 8.4* 10.3*  HCT 24.7* 31.7*  PLT 199 238   BMET Recent Labs    03/17/18 0632 03/19/18 0435  NA 142 141  K 4.3 4.4  CL 106 106  CO2 30 30  GLUCOSE 217* 153*  BUN 8 7  CREATININE 0.77 0.95  CALCIUM 8.1* 8.3*   PT/INR No results for input(s): LABPROT, INR in the last 72 hours. ABG No results for input(s): PHART, HCO3 in the last 72 hours.  Invalid input(s): PCO2, PO2  Studies/Results: No results found.  Anti-infectives: Anti-infectives (From admission, onward)   Start     Dose/Rate Route Frequency Ordered Stop   03/16/18 0615  piperacillin-tazobactam (ZOSYN) IVPB 3.375 g     3.375 g 100 mL/hr over 30 Minutes Intravenous  Once 03/16/18 0601 03/16/18 1555      Assessment/Plan: s/p Procedure(s): COLON RESECTION   Patient status post exploratory laparotomy for what proved to be an internal hernia with bowel obstruction.  She is resolving and doing quite well at this time.  Will advance diet today possibly home tomorrow.  My concern for scant  growth staph in that culture is very low as she did not have any perforation signs.  Florene Glen, MD, FACS  03/19/2018

## 2018-03-20 LAB — CBC WITH DIFFERENTIAL/PLATELET
BASOS PCT: 1 %
Basophils Absolute: 0.1 10*3/uL (ref 0–0.1)
Eosinophils Absolute: 0.1 10*3/uL (ref 0–0.7)
Eosinophils Relative: 1 %
HEMATOCRIT: 25.3 % — AB (ref 35.0–47.0)
HEMOGLOBIN: 8.7 g/dL — AB (ref 12.0–16.0)
LYMPHS PCT: 18 %
Lymphs Abs: 1.1 10*3/uL (ref 1.0–3.6)
MCH: 30.4 pg (ref 26.0–34.0)
MCHC: 34.3 g/dL (ref 32.0–36.0)
MCV: 88.7 fL (ref 80.0–100.0)
MONOS PCT: 5 %
Monocytes Absolute: 0.3 10*3/uL (ref 0.2–0.9)
NEUTROS ABS: 4.7 10*3/uL (ref 1.4–6.5)
NEUTROS PCT: 75 %
Platelets: 234 10*3/uL (ref 150–440)
RBC: 2.85 MIL/uL — ABNORMAL LOW (ref 3.80–5.20)
RDW: 13.9 % (ref 11.5–14.5)
WBC: 6.3 10*3/uL (ref 3.6–11.0)

## 2018-03-20 LAB — GLUCOSE, CAPILLARY
GLUCOSE-CAPILLARY: 147 mg/dL — AB (ref 70–99)
GLUCOSE-CAPILLARY: 178 mg/dL — AB (ref 70–99)
Glucose-Capillary: 163 mg/dL — ABNORMAL HIGH (ref 70–99)
Glucose-Capillary: 115 mg/dL — ABNORMAL HIGH (ref 70–99)

## 2018-03-20 MED ORDER — OXYCODONE-ACETAMINOPHEN 5-325 MG PO TABS: 1.0000 | ORAL_TABLET | Freq: Four times a day (QID) | ORAL | 0 refills | Status: DC | PRN

## 2018-03-20 MED ORDER — SIMETHICONE 80 MG PO CHEW
80.0000 mg | CHEWABLE_TABLET | Freq: Four times a day (QID) | ORAL | Status: DC | PRN
Start: 2018-03-20 — End: 2018-03-21
  Administered 2018-03-20 – 2018-03-21 (×2): 80 mg via ORAL
  Filled 2018-03-20 (×4): qty 1

## 2018-03-20 NOTE — Progress Notes (Signed)
4 Days Post-Op  Subjective: Status post exploratory laparotomy for internal hernia.  Patient is tolerating a diet passing gas and having bowel movements she has minimal cramping abdominal pain on occasion.  Of note is a relative hypotension that happens in the middle of the night and has happened almost every night while in the hospital.  She does not have a concomitant compensatory tachycardia during these episodes.  I spoke with Dr. Windell Moment and he states that she did the same thing with her prior operation as well.  She is asymptomatic at this time and in fact she wants to go home.  Objective: Vital signs in last 24 hours: Temp:  [97.8 F (36.6 C)-98.3 F (36.8 C)] 98.3 F (36.8 C) (08/07 0550) Pulse Rate:  [78-94] 78 (08/07 0550) Resp:  [18] 18 (08/07 0550) BP: (70-123)/(43-83) 93/62 (08/07 0550) SpO2:  [97 %-100 %] 97 % (08/07 0550) Last BM Date: 03/15/18  Intake/Output from previous day: 08/06 0701 - 08/07 0700 In: 118 [P.O.:118] Out: 3450 [Urine:3450] Intake/Output this shift: No intake/output data recorded.  Physical exam:  Vital signs reviewed currently stable afebrile Abdomen soft nondistended nontympanitic and nontender wound is dressed and clean with no erythema Penrose drain is in place no purulence.  Nontender calves.  Lab Results: CBC  Recent Labs    03/19/18 0435 03/20/18 0314  WBC 6.4 6.3  HGB 10.3* 8.7*  HCT 31.7* 25.3*  PLT 238 234   BMET Recent Labs    03/19/18 0435  NA 141  K 4.4  CL 106  CO2 30  GLUCOSE 153*  BUN 7  CREATININE 0.95  CALCIUM 8.3*   PT/INR No results for input(s): LABPROT, INR in the last 72 hours. ABG No results for input(s): PHART, HCO3 in the last 72 hours.  Invalid input(s): PCO2, PO2  Studies/Results: No results found.  Anti-infectives: Anti-infectives (From admission, onward)   Start     Dose/Rate Route Frequency Ordered Stop   03/16/18 0615  piperacillin-tazobactam (ZOSYN) IVPB 3.375 g     3.375 g 100  mL/hr over 30 Minutes Intravenous  Once 03/16/18 0601 03/16/18 1555      Assessment/Plan: s/p Procedure(s): COLON RESECTION   Labs are noted.  H&H fluctuating.  No symptoms.  No need for transfusion at this time.  No sign of active bleeding.  Patient doing very well on a soft diet.  She thinks she might be able to go home this afternoon and I will prepare for her discharge later today.  Florene Glen, MD, FACS  03/20/2018

## 2018-03-20 NOTE — Discharge Instructions (Signed)
May shower Change dressing daily leaving Penrose drain in place. Diet as tolerated Resume home medications except high-dose narcotics Utilize 5 mg oxycodone for pain as needed Follow-up with Dr. Burt Knack in 10 days for Penrose drain removal.

## 2018-03-20 NOTE — Progress Notes (Signed)
Visited patient.  She has not yet had lunch.  She feels much better but still has some cramping abdominal pain.  She attributes it to "gas" and is passing a lot of gas.  She has no nausea or vomiting.  She wishes to stay until tomorrow.  No change in exam.  Patient wants to advance her diet and asked for something for gas.  I will institute those orders and probably discharge in the morning.

## 2018-03-21 LAB — CULTURE, BLOOD (ROUTINE X 2)
CULTURE: NO GROWTH
Culture: NO GROWTH
SPECIAL REQUESTS: ADEQUATE

## 2018-03-21 LAB — AEROBIC/ANAEROBIC CULTURE W GRAM STAIN (SURGICAL/DEEP WOUND)

## 2018-03-21 LAB — AEROBIC/ANAEROBIC CULTURE (SURGICAL/DEEP WOUND)

## 2018-03-21 LAB — GLUCOSE, CAPILLARY
GLUCOSE-CAPILLARY: 106 mg/dL — AB (ref 70–99)
Glucose-Capillary: 184 mg/dL — ABNORMAL HIGH (ref 70–99)

## 2018-03-21 MED ORDER — SIMETHICONE 80 MG PO CHEW: 80.0000 mg | CHEWABLE_TABLET | Freq: Four times a day (QID) | ORAL | 0 refills | Status: DC | PRN

## 2018-03-21 NOTE — Progress Notes (Addendum)
Patient discharge teaching given, including activity, diet, follow-up appoints, and medications. Patient verbalized understanding of all discharge instructions. IV access was d/c'd. Vitals are stable. Dressing changes for her midline were taught to pt, pt demonstrated understanding by demonstrating teach back method. Skin is intact except as charted in most recent assessments. Pt to be escorted out volunteer, to be driven home by family.  Ghali Morissette [employer]

## 2018-03-21 NOTE — Plan of Care (Signed)
  Problem: Education: Goal: Knowledge of General Education information will improve Description Including pain rating scale, medication(s)/side effects and non-pharmacologic comfort measures 03/21/2018 0603 by Jannifer Rodney A, RN Outcome: Progressing 03/21/2018 0603 by Jannifer Rodney A, RN Outcome: Progressing   Problem: Health Behavior/Discharge Planning: Goal: Ability to manage health-related needs will improve 03/21/2018 0603 by Jannifer Rodney A, RN Outcome: Progressing 03/21/2018 0603 by Jannifer Rodney A, RN Outcome: Progressing   Problem: Clinical Measurements: Goal: Ability to maintain clinical measurements within normal limits will improve 03/21/2018 0603 by Jannifer Rodney A, RN Outcome: Progressing 03/21/2018 0603 by Jannifer Rodney A, RN Outcome: Progressing Goal: Will remain free from infection 03/21/2018 0603 by Jannifer Rodney A, RN Outcome: Progressing 03/21/2018 0603 by Jannifer Rodney A, RN Outcome: Progressing Goal: Diagnostic test results will improve 03/21/2018 0603 by Jannifer Rodney A, RN Outcome: Progressing 03/21/2018 0603 by Jannifer Rodney A, RN Outcome: Progressing Goal: Respiratory complications will improve 03/21/2018 0603 by Jannifer Rodney A, RN Outcome: Progressing 03/21/2018 0603 by Jannifer Rodney A, RN Outcome: Progressing Goal: Cardiovascular complication will be avoided 03/21/2018 0603 by Jannifer Rodney A, RN Outcome: Progressing 03/21/2018 0603 by Jannifer Rodney A, RN Outcome: Progressing   Problem: Activity: Goal: Risk for activity intolerance will decrease 03/21/2018 0603 by Jannifer Rodney A, RN Outcome: Progressing 03/21/2018 0603 by Jannifer Rodney A, RN Outcome: Progressing   Problem: Nutrition: Goal: Adequate nutrition will be maintained 03/21/2018 0603 by Jannifer Rodney A, RN Outcome: Progressing 03/21/2018 0603 by Jannifer Rodney A, RN Outcome: Progressing   Problem: Coping: Goal: Level of anxiety will decrease 03/21/2018 0603 by Jannifer Rodney A, RN Outcome:  Progressing 03/21/2018 0603 by Jannifer Rodney A, RN Outcome: Progressing   Problem: Elimination: Goal: Will not experience complications related to bowel motility 03/21/2018 0603 by Jannifer Rodney A, RN Outcome: Progressing 03/21/2018 0603 by Jannifer Rodney A, RN Outcome: Progressing Goal: Will not experience complications related to urinary retention 03/21/2018 0603 by Jannifer Rodney A, RN Outcome: Progressing 03/21/2018 0603 by Jannifer Rodney A, RN Outcome: Progressing   Problem: Pain Managment: Goal: General experience of comfort will improve 03/21/2018 0603 by Jannifer Rodney A, RN Outcome: Progressing 03/21/2018 0603 by Jannifer Rodney A, RN Outcome: Progressing   Problem: Safety: Goal: Ability to remain free from injury will improve 03/21/2018 0603 by Jannifer Rodney A, RN Outcome: Progressing 03/21/2018 0603 by Jannifer Rodney A, RN Outcome: Progressing   Problem: Skin Integrity: Goal: Risk for impaired skin integrity will decrease 03/21/2018 0603 by Jannifer Rodney A, RN Outcome: Progressing 03/21/2018 0603 by Vergie Living, RN Outcome: Progressing

## 2018-03-21 NOTE — Care Management Important Message (Signed)
Copy of signed IM left with patient in room.  

## 2018-03-21 NOTE — Care Management Note (Signed)
Case Management Note  Patient Details  Name: KALA AMBRIZ MRN: [id number] Date of Birth: 10/06/1975   Patient to discharge home today.  Bedside RN to provide education on penrose management.  No RNCM needs reported.    Subjective/Objective:                    Action/Plan:   Expected Discharge Date:  03/21/18               Expected Discharge Plan:  Home/Self Care  In-House Referral:     Discharge planning Services     Post Acute Care Choice:    Choice offered to:     DME Arranged:    DME Agency:     HH Arranged:    HH Agency:     Status of Service:  Completed, signed off  If discussed at [employer] of Stay Meetings, dates discussed:    Additional Comments:  Beverly Sessions, RN 03/21/2018, 10:24 AM

## 2018-03-21 NOTE — Progress Notes (Signed)
5 Days Post-Op  Subjective: Patient feels well today following exploratory laparotomy for internal hernia and bowel obstruction.  Tolerating a diet having bowel movements ready for discharge  Objective: Vital signs in last 24 hours: Temp:  [98.1 F (36.7 C)-98.3 F (36.8 C)] 98.3 F (36.8 C) (08/08 0534) Pulse Rate:  [64-99] 72 (08/08 0534) Resp:  [18] 18 (08/08 0534) BP: (95-155)/(63-96) 108/67 (08/08 0534) SpO2:  [97 %] 97 % (08/08 0534) Weight:  [69.4 kg] 69.4 kg (08/08 0607) Last BM Date: 03/20/18  Intake/Output from previous day: 08/07 0701 - 08/08 0700 In: -  Out: 900 [Urine:900] Intake/Output this shift: No intake/output data recorded.  Physical exam:  Abdomen soft nondistended nontympanitic and nontender wound is clean without erythema Penrose drain in place nontender calves  Lab Results: CBC  Recent Labs    03/19/18 0435 03/20/18 0314  WBC 6.4 6.3  HGB 10.3* 8.7*  HCT 31.7* 25.3*  PLT 238 234   BMET Recent Labs    03/19/18 0435  NA 141  K 4.4  CL 106  CO2 30  GLUCOSE 153*  BUN 7  CREATININE 0.95  CALCIUM 8.3*   PT/INR No results for input(s): LABPROT, INR in the last 72 hours. ABG No results for input(s): PHART, HCO3 in the last 72 hours.  Invalid input(s): PCO2, PO2  Studies/Results: No results found.  Anti-infectives: Anti-infectives (From admission, onward)   Start     Dose/Rate Route Frequency Ordered Stop   03/16/18 0615  piperacillin-tazobactam (ZOSYN) IVPB 3.375 g     3.375 g 100 mL/hr over 30 Minutes Intravenous  Once 03/16/18 0601 03/16/18 1555      Assessment/Plan: s/p Procedure(s): COLON RESECTION   White count is normal H&H is unchanged.  Patient doing very well recommend discharge today with instructions to shower and place dry dressing over Penrose and follow-up in my office in 10 days for Penrose removal.  Florene Glen, MD, FACS  03/21/2018

## 2018-03-21 NOTE — Discharge Summary (Signed)
Physician Discharge Summary  Patient ID: NEVEAH BANG MRN: [id number] DOB/AGE: [birth year] 42 y.o.  Admit date: 03/16/2018 Discharge date: 03/21/2018   Discharge Diagnoses:  Active Problems:   Acute abdomen   Procedures: Exploratory laparotomy for internal hernia and bowel obstruction  Hospital Course: This a patient with a history of extensive adhesio lysis and bowel resection following or during a GYN procedure 2 weeks ago.  She was readmitted to the hospital with signs of an acute abdomen.  She was taken the operating room emergently for what was believed to be a perforation but showed to be an internal hernia with bowel obstruction.  Lysis of adhesions and reduction of the internal hernia was performed.  Postoperatively she was advanced in her diet and is tolerating a regular diet at this time she is having bowel movements normally.  She will follow-up in our office in 10 days for Penrose drain removal as her wound has been closed over a Penrose.  He is instructed about showering and placing a new dressing each time.  Her only new medication will be Percocet.  She is to resume all home medications.  Consults: None  Disposition: Discharge disposition: 01-Home or [facility]    [name], MD Follow up in 10 day(s).   Specialty:  Family Medicine Contact information: [place] [address] [phone]        Florene Glen, MD Follow up in 10 day(s).   Specialty:  General Surgery Contact information: [place] [address] [phone]           Florene Glen, MD, FACS

## 2018-03-25 DIAGNOSIS — M7542 Impingement syndrome of left shoulder: Secondary | ICD-10-CM | POA: Diagnosis not present

## 2018-03-25 DIAGNOSIS — M19012 Primary osteoarthritis, left shoulder: Secondary | ICD-10-CM | POA: Diagnosis not present

## 2018-03-25 DIAGNOSIS — S46002S Unspecified injury of muscle(s) and tendon(s) of the rotator cuff of left shoulder, sequela: Secondary | ICD-10-CM | POA: Diagnosis not present

## 2018-03-25 DIAGNOSIS — Z5181 Encounter for therapeutic drug level monitoring: Secondary | ICD-10-CM | POA: Diagnosis not present

## 2018-04-02 ENCOUNTER — Ambulatory Visit (INDEPENDENT_AMBULATORY_CARE_PROVIDER_SITE_OTHER): Payer: PPO | Admitting: Surgery

## 2018-04-02 VITALS — BP 118/81 | HR 96 | Temp 97.7°F | Ht 64.5 in | Wt 154.0 lb

## 2018-04-02 DIAGNOSIS — K56609 Unspecified intestinal obstruction, unspecified as to partial versus complete obstruction: Secondary | ICD-10-CM

## 2018-04-02 DIAGNOSIS — K2930 Chronic superficial gastritis without bleeding: Secondary | ICD-10-CM | POA: Insufficient documentation

## 2018-04-02 DIAGNOSIS — G061 Intraspinal abscess and granuloma: Secondary | ICD-10-CM | POA: Insufficient documentation

## 2018-04-02 NOTE — Patient Instructions (Addendum)

## 2018-04-02 NOTE — Progress Notes (Signed)
Outpatient postop visit  04/02/2018  Marissa [name] is an 42 y.o. female.    Procedure: Exploratory laparotomy for bowel obstruction  CC: Poor appetite  HPI: This patient status post exploratory laparotomy for internal hernia with bowel obstruction.  He had a prior small bowel resection following a GYN procedure and this was done in the immediate postoperative period for acute bowel obstruction.  Her Cintron-Diaz had performed her first operation with the gynecologist.  Patient is eating fairly well but does not have a desire for food.  He is using dietary supplements.  Medications reviewed.    Physical Exam:  LMP 12/21/2014 (Approximate)     PE: Weight was reviewed.  Patient in no acute distress vital signs reviewed. Abdomen soft nondistended nontympanitic and nontender.  Penrose drain is in place.  No purulence no erythema  Penrose drain is removed half of the staples are removed and Steri-Strips and benzoin are placed.    Assessment/Plan:  Patient status post exploratory laparotomy for small bowel resection.  Half his staples removed.  Drain removed.  Discussed wound care and will see her next week for removal of the remainder of the staples.  I also discussed with the patient and her significant other that I would be leaving  medical group in the next few weeks and that as she has an existing relationship with Dr. Windell Moment at [facility] that she would get her future follow-up after I leave in September with Dr. Windell Moment she understood and agreed with that plan Florene Glen, MD, FACS

## 2018-04-04 ENCOUNTER — Telehealth: Payer: Self-pay | Admitting: *Deleted
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
## 2018-04-04 NOTE — Telephone Encounter (Signed)
Patient had surgery(Colon Resection) with Dr.Cooper on 03/16/18 and she is wanting to know is it okay for her to ride down to [employer]. Please call and advise

## 2018-04-05 DIAGNOSIS — H547 Unspecified visual loss: Secondary | ICD-10-CM | POA: Diagnosis not present

## 2018-04-05 DIAGNOSIS — K56609 Unspecified intestinal obstruction, unspecified as to partial versus complete obstruction: Secondary | ICD-10-CM | POA: Diagnosis not present

## 2018-04-05 DIAGNOSIS — Z791 Long term (current) use of non-steroidal anti-inflammatories (NSAID): Secondary | ICD-10-CM | POA: Diagnosis not present

## 2018-04-05 DIAGNOSIS — E1140 Type 2 diabetes mellitus with diabetic neuropathy, unspecified: Secondary | ICD-10-CM | POA: Diagnosis not present

## 2018-04-05 DIAGNOSIS — I428 Other cardiomyopathies: Secondary | ICD-10-CM | POA: Diagnosis present

## 2018-04-05 DIAGNOSIS — F419 Anxiety disorder, unspecified: Secondary | ICD-10-CM | POA: Diagnosis present

## 2018-04-05 DIAGNOSIS — F329 Major depressive disorder, single episode, unspecified: Secondary | ICD-10-CM | POA: Diagnosis not present

## 2018-04-05 DIAGNOSIS — I420 Dilated cardiomyopathy: Secondary | ICD-10-CM | POA: Diagnosis present

## 2018-04-05 DIAGNOSIS — Z794 Long term (current) use of insulin: Secondary | ICD-10-CM

## 2018-04-05 DIAGNOSIS — Z981 Arthrodesis status: Secondary | ICD-10-CM

## 2018-04-05 DIAGNOSIS — K659 Peritonitis, unspecified: Secondary | ICD-10-CM | POA: Diagnosis not present

## 2018-04-05 DIAGNOSIS — Z881 Allergy status to other antibiotic agents status: Secondary | ICD-10-CM | POA: Diagnosis not present

## 2018-04-05 DIAGNOSIS — I252 Old myocardial infarction: Secondary | ICD-10-CM

## 2018-04-05 DIAGNOSIS — Z79899 Other long term (current) drug therapy: Secondary | ICD-10-CM | POA: Diagnosis not present

## 2018-04-05 DIAGNOSIS — Z91018 Allergy to other foods: Secondary | ICD-10-CM | POA: Diagnosis not present

## 2018-04-05 DIAGNOSIS — I255 Ischemic cardiomyopathy: Secondary | ICD-10-CM | POA: Diagnosis present

## 2018-04-05 DIAGNOSIS — E785 Hyperlipidemia, unspecified: Secondary | ICD-10-CM | POA: Diagnosis present

## 2018-04-05 DIAGNOSIS — E1169 Type 2 diabetes mellitus with other specified complication: Secondary | ICD-10-CM | POA: Diagnosis not present

## 2018-04-05 DIAGNOSIS — L02211 Cutaneous abscess of abdominal wall: Secondary | ICD-10-CM | POA: Diagnosis present

## 2018-04-05 DIAGNOSIS — T8143XA Infection following a procedure, organ and space surgical site, initial encounter: Principal | ICD-10-CM | POA: Diagnosis present

## 2018-04-05 DIAGNOSIS — R031 Nonspecific low blood-pressure reading: Secondary | ICD-10-CM | POA: Diagnosis not present

## 2018-04-05 DIAGNOSIS — I2510 Atherosclerotic heart disease of native coronary artery without angina pectoris: Secondary | ICD-10-CM | POA: Diagnosis not present

## 2018-04-05 DIAGNOSIS — I509 Heart failure, unspecified: Secondary | ICD-10-CM | POA: Diagnosis present

## 2018-04-05 DIAGNOSIS — I110 Hypertensive heart disease with heart failure: Secondary | ICD-10-CM | POA: Diagnosis not present

## 2018-04-05 NOTE — ED Triage Notes (Addendum)
Pt to triage via wheelchair. Pt reports surgery on 03/16/18. Tonight here because she is having headache, not feeling well and her blood pressure is high.  Pt also having pain at her surgical site and in her abd.

## 2018-04-06 ENCOUNTER — Emergency Department: Payer: PPO

## 2018-04-06 ENCOUNTER — Inpatient Hospital Stay
Admission: EM | Admit: 2018-04-06 | Discharge: 2018-04-11 | DRG: 863 | Disposition: A | Discharge status: Home / Self Care | Payer: PPO | Attending: General Surgery | Admitting: General Surgery

## 2018-04-06 ENCOUNTER — Other Ambulatory Visit: Payer: Self-pay

## 2018-04-06 DIAGNOSIS — E1169 Type 2 diabetes mellitus with other specified complication: Secondary | ICD-10-CM | POA: Diagnosis present

## 2018-04-06 DIAGNOSIS — Z881 Allergy status to other antibiotic agents status: Secondary | ICD-10-CM | POA: Diagnosis not present

## 2018-04-06 DIAGNOSIS — L02211 Cutaneous abscess of abdominal wall: Secondary | ICD-10-CM | POA: Diagnosis present

## 2018-04-06 DIAGNOSIS — H547 Unspecified visual loss: Secondary | ICD-10-CM | POA: Diagnosis present

## 2018-04-06 DIAGNOSIS — I428 Other cardiomyopathies: Secondary | ICD-10-CM | POA: Diagnosis present

## 2018-04-06 DIAGNOSIS — F419 Anxiety disorder, unspecified: Secondary | ICD-10-CM | POA: Diagnosis present

## 2018-04-06 DIAGNOSIS — Z79899 Other long term (current) drug therapy: Secondary | ICD-10-CM | POA: Diagnosis not present

## 2018-04-06 DIAGNOSIS — R031 Nonspecific low blood-pressure reading: Secondary | ICD-10-CM | POA: Diagnosis present

## 2018-04-06 DIAGNOSIS — E785 Hyperlipidemia, unspecified: Secondary | ICD-10-CM | POA: Diagnosis present

## 2018-04-06 DIAGNOSIS — I509 Heart failure, unspecified: Secondary | ICD-10-CM | POA: Diagnosis present

## 2018-04-06 DIAGNOSIS — Z794 Long term (current) use of insulin: Secondary | ICD-10-CM | POA: Diagnosis not present

## 2018-04-06 DIAGNOSIS — I2510 Atherosclerotic heart disease of native coronary artery without angina pectoris: Secondary | ICD-10-CM | POA: Diagnosis present

## 2018-04-06 DIAGNOSIS — I420 Dilated cardiomyopathy: Secondary | ICD-10-CM | POA: Diagnosis present

## 2018-04-06 DIAGNOSIS — I252 Old myocardial infarction: Secondary | ICD-10-CM | POA: Diagnosis not present

## 2018-04-06 DIAGNOSIS — Z981 Arthrodesis status: Secondary | ICD-10-CM | POA: Diagnosis not present

## 2018-04-06 DIAGNOSIS — E1140 Type 2 diabetes mellitus with diabetic neuropathy, unspecified: Secondary | ICD-10-CM | POA: Diagnosis present

## 2018-04-06 DIAGNOSIS — I110 Hypertensive heart disease with heart failure: Secondary | ICD-10-CM | POA: Diagnosis present

## 2018-04-06 DIAGNOSIS — K659 Peritonitis, unspecified: Secondary | ICD-10-CM

## 2018-04-06 DIAGNOSIS — Z91018 Allergy to other foods: Secondary | ICD-10-CM | POA: Diagnosis not present

## 2018-04-06 DIAGNOSIS — I255 Ischemic cardiomyopathy: Secondary | ICD-10-CM | POA: Diagnosis present

## 2018-04-06 DIAGNOSIS — Z791 Long term (current) use of non-steroidal anti-inflammatories (NSAID): Secondary | ICD-10-CM | POA: Diagnosis not present

## 2018-04-06 DIAGNOSIS — F329 Major depressive disorder, single episode, unspecified: Secondary | ICD-10-CM | POA: Diagnosis present

## 2018-04-06 DIAGNOSIS — T8143XA Infection following a procedure, organ and space surgical site, initial encounter: Secondary | ICD-10-CM | POA: Diagnosis present

## 2018-04-06 LAB — CBC WITH DIFFERENTIAL/PLATELET
BASOS PCT: 1 %
Basophils Absolute: 0.1 10*3/uL (ref 0–0.1)
EOS ABS: 0 10*3/uL (ref 0–0.7)
EOS PCT: 1 %
HCT: 33.3 % — ABNORMAL LOW (ref 35.0–47.0)
Hemoglobin: 11.6 g/dL — ABNORMAL LOW (ref 12.0–16.0)
Lymphocytes Relative: 19 %
Lymphs Abs: 1.5 10*3/uL (ref 1.0–3.6)
MCH: 29.8 pg (ref 26.0–34.0)
MCHC: 34.8 g/dL (ref 32.0–36.0)
MCV: 85.8 fL (ref 80.0–100.0)
MONO ABS: 0.7 10*3/uL (ref 0.2–0.9)
Monocytes Relative: 9 %
Neutro Abs: 5.4 10*3/uL (ref 1.4–6.5)
Neutrophils Relative %: 70 %
PLATELETS: 231 10*3/uL (ref 150–440)
RBC: 3.89 MIL/uL (ref 3.80–5.20)
RDW: 14.4 % (ref 11.5–14.5)
WBC: 7.7 10*3/uL (ref 3.6–11.0)

## 2018-04-06 LAB — COMPREHENSIVE METABOLIC PANEL
ALBUMIN: 3.4 g/dL — AB (ref 3.5–5.0)
ALT: 13 U/L (ref 0–44)
AST: 25 U/L (ref 15–41)
Alkaline Phosphatase: 75 U/L (ref 38–126)
Anion gap: 10 (ref 5–15)
BUN: 9 mg/dL (ref 6–20)
CHLORIDE: 105 mmol/L (ref 98–111)
CO2: 25 mmol/L (ref 22–32)
Calcium: 9.1 mg/dL (ref 8.9–10.3)
Creatinine, Ser: 0.67 mg/dL (ref 0.44–1.00)
GFR calc Af Amer: >60 mL/min (ref >60)
GLUCOSE: 194 mg/dL — AB (ref 70–99)
POTASSIUM: 3.4 mmol/L — AB (ref 3.5–5.1)
Sodium: 140 mmol/L (ref 135–145)
Total Bilirubin: 0.6 mg/dL (ref 0.3–1.2)
Total Protein: 7.5 g/dL (ref 6.5–8.1)

## 2018-04-06 LAB — APTT: APTT: 27 s (ref 24–36)

## 2018-04-06 LAB — PROTIME-INR
INR: 1.08
Prothrombin Time: 13.9 seconds (ref 11.4–15.2)

## 2018-04-06 LAB — LIPASE, BLOOD: LIPASE: 57 U/L — AB (ref 11–51)

## 2018-04-06 MED ORDER — LISINOPRIL 10 MG PO TABS
10.0000 mg | ORAL_TABLET | Freq: Every day | ORAL | Status: DC
  Administered 2018-04-06: 10 mg via ORAL
  Filled 2018-04-06: qty 1

## 2018-04-06 MED ORDER — MORPHINE SULFATE (PF) 4 MG/ML IV SOLN
4.0000 mg | INTRAVENOUS | Status: DC | PRN
  Administered 2018-04-06 – 2018-04-10 (×10): 4 mg via INTRAVENOUS
  Filled 2018-04-06 (×10): qty 1

## 2018-04-06 MED ORDER — OXYCODONE-ACETAMINOPHEN 7.5-325 MG PO TABS
2.0000 | ORAL_TABLET | ORAL | Status: DC | PRN
  Administered 2018-04-06 – 2018-04-11 (×8): 2 via ORAL
  Filled 2018-04-06 (×8): qty 2

## 2018-04-06 MED ORDER — HYDROMORPHONE HCL 1 MG/ML IJ SOLN
1.0000 mg | Freq: Once | INTRAMUSCULAR | Status: AC
  Administered 2018-04-06: 1 mg via INTRAVENOUS

## 2018-04-06 MED ORDER — MORPHINE SULFATE (PF) 4 MG/ML IV SOLN
4.0000 mg | Freq: Once | INTRAVENOUS | Status: AC
  Administered 2018-04-06: 4 mg via INTRAVENOUS
  Filled 2018-04-06: qty 1

## 2018-04-06 MED ORDER — AMITRIPTYLINE HCL 50 MG PO TABS
50.0000 mg | ORAL_TABLET | Freq: Every day | ORAL | Status: DC
  Administered 2018-04-06: 50 mg via ORAL
  Filled 2018-04-06 (×6): qty 1

## 2018-04-06 MED ORDER — ALPRAZOLAM 0.5 MG PO TABS
0.5000 mg | ORAL_TABLET | Freq: Three times a day (TID) | ORAL | Status: DC | PRN
  Administered 2018-04-06 – 2018-04-11 (×5): 0.5 mg via ORAL
  Filled 2018-04-06 (×5): qty 1

## 2018-04-06 MED ORDER — ONDANSETRON HCL 4 MG/2ML IJ SOLN: 4.0000 mg | Freq: Four times a day (QID) | INTRAMUSCULAR | Status: DC | PRN

## 2018-04-06 MED ORDER — HYDROMORPHONE HCL 1 MG/ML IJ SOLN
INTRAMUSCULAR | Status: AC
  Administered 2018-04-06: 1 mg via INTRAVENOUS
  Filled 2018-04-06: qty 1

## 2018-04-06 MED ORDER — ONDANSETRON 4 MG PO TBDP: 4.0000 mg | ORAL_TABLET | Freq: Four times a day (QID) | ORAL | Status: DC | PRN

## 2018-04-06 MED ORDER — ONDANSETRON HCL 4 MG/2ML IJ SOLN
4.0000 mg | Freq: Once | INTRAMUSCULAR | Status: AC
  Administered 2018-04-06: 4 mg via INTRAVENOUS

## 2018-04-06 MED ORDER — HYDROMORPHONE HCL 1 MG/ML IJ SOLN
INTRAMUSCULAR | Status: AC
  Filled 2018-04-06: qty 1

## 2018-04-06 MED ORDER — GABAPENTIN 600 MG PO TABS
600.0000 mg | ORAL_TABLET | Freq: Three times a day (TID) | ORAL | Status: DC
  Administered 2018-04-06 – 2018-04-11 (×11): 600 mg via ORAL
  Filled 2018-04-06 (×13): qty 1

## 2018-04-06 MED ORDER — SODIUM CHLORIDE 0.9 % IV BOLUS
500.0000 mL | Freq: Once | INTRAVENOUS | Status: AC
  Administered 2018-04-06: 500 mL via INTRAVENOUS

## 2018-04-06 MED ORDER — ONDANSETRON HCL 4 MG/2ML IJ SOLN
INTRAMUSCULAR | Status: AC
Start: 2018-04-06 — End: 2018-04-06
  Administered 2018-04-06: 4 mg via INTRAVENOUS
  Filled 2018-04-06: qty 2

## 2018-04-06 MED ORDER — GENTAMICIN SULFATE 40 MG/ML IJ SOLN
2.0000 mg/kg | Freq: Once | INTRAVENOUS | Status: DC
  Administered 2018-04-06: 140 mg via INTRAVENOUS
  Filled 2018-04-06: qty 3.5

## 2018-04-06 MED ORDER — GENTAMICIN SULFATE 40 MG/ML IJ SOLN
2.0000 mg/kg | Freq: Three times a day (TID) | INTRAVENOUS | Status: DC
  Administered 2018-04-06 – 2018-04-08 (×7): 140 mg via INTRAVENOUS
  Filled 2018-04-06 (×11): qty 3.5

## 2018-04-06 MED ORDER — PANTOPRAZOLE SODIUM 40 MG PO TBEC
40.0000 mg | DELAYED_RELEASE_TABLET | Freq: Every day | ORAL | Status: DC
  Administered 2018-04-06 – 2018-04-10 (×3): 40 mg via ORAL
  Filled 2018-04-06 (×5): qty 1

## 2018-04-06 MED ORDER — METRONIDAZOLE IVPB CUSTOM
1000.0000 mg | Freq: Three times a day (TID) | INTRAVENOUS | Status: DC
  Administered 2018-04-06 – 2018-04-07 (×4): 1000 mg via INTRAVENOUS
  Filled 2018-04-06 (×5): qty 200

## 2018-04-06 MED ORDER — LACTATED RINGERS IV SOLN
INTRAVENOUS | Status: DC
  Administered 2018-04-06 – 2018-04-10 (×7): via INTRAVENOUS

## 2018-04-06 MED ORDER — HYDROMORPHONE HCL 1 MG/ML IJ SOLN
1.0000 mg | Freq: Once | INTRAMUSCULAR | Status: AC
  Administered 2018-04-06 (×2): 1 mg via INTRAVENOUS

## 2018-04-06 MED ORDER — HEPARIN SODIUM (PORCINE) 5000 UNIT/ML IJ SOLN
5000.0000 [IU] | Freq: Three times a day (TID) | INTRAMUSCULAR | Status: DC
  Administered 2018-04-06 – 2018-04-11 (×16): 5000 [IU] via SUBCUTANEOUS
  Filled 2018-04-06 (×17): qty 1

## 2018-04-06 MED ORDER — IOPAMIDOL (ISOVUE-300) INJECTION 61%
100.0000 mL | Freq: Once | INTRAVENOUS | Status: AC | PRN
  Administered 2018-04-06: 100 mL via INTRAVENOUS

## 2018-04-06 MED ORDER — CARVEDILOL 25 MG PO TABS
25.0000 mg | ORAL_TABLET | Freq: Two times a day (BID) | ORAL | Status: DC
  Administered 2018-04-06: 25 mg via ORAL
  Filled 2018-04-06: qty 1

## 2018-04-06 MED ORDER — KETOROLAC TROMETHAMINE 30 MG/ML IJ SOLN
30.0000 mg | Freq: Four times a day (QID) | INTRAMUSCULAR | Status: AC | PRN
  Administered 2018-04-06 – 2018-04-08 (×3): 30 mg via INTRAVENOUS
  Filled 2018-04-06 (×3): qty 1

## 2018-04-06 MED ORDER — SODIUM CHLORIDE 0.9 % IV BOLUS
1000.0000 mL | Freq: Once | INTRAVENOUS | Status: AC
  Administered 2018-04-06: 1000 mL via INTRAVENOUS

## 2018-04-06 MED ORDER — METRONIDAZOLE IVPB CUSTOM
1000.0000 mg | Freq: Once | INTRAVENOUS | Status: DC
  Administered 2018-04-06: 500 mg via INTRAVENOUS
  Filled 2018-04-06 (×2): qty 200

## 2018-04-06 MED ORDER — HYDROCODONE-ACETAMINOPHEN 5-325 MG PO TABS
1.0000 | ORAL_TABLET | ORAL | Status: DC | PRN
  Administered 2018-04-06 (×2): 1 via ORAL
  Filled 2018-04-06 (×2): qty 1

## 2018-04-06 MED ORDER — GABAPENTIN 600 MG PO TABS: 300.0000 mg | ORAL_TABLET | Freq: Three times a day (TID) | ORAL | Status: DC

## 2018-04-06 MED ORDER — ONDANSETRON HCL 4 MG/2ML IJ SOLN
INTRAMUSCULAR | Status: AC
  Administered 2018-04-06: 4 mg
  Filled 2018-04-06: qty 2

## 2018-04-06 MED ORDER — PROCHLORPERAZINE EDISYLATE 10 MG/2ML IJ SOLN: 5.0000 mg | Freq: Four times a day (QID) | INTRAMUSCULAR | Status: DC | PRN

## 2018-04-06 MED ORDER — MORPHINE SULFATE (PF) 4 MG/ML IV SOLN
4.0000 mg | Freq: Once | INTRAVENOUS | Status: AC
  Administered 2018-04-06: 4 mg via INTRAVENOUS

## 2018-04-06 MED ORDER — PROCHLORPERAZINE MALEATE 10 MG PO TABS: 10.0000 mg | ORAL_TABLET | Freq: Four times a day (QID) | ORAL | Status: DC | PRN

## 2018-04-06 MED ORDER — HEPARIN SODIUM (PORCINE) 5000 UNIT/ML IJ SOLN
5000.0000 [IU] | Freq: Three times a day (TID) | INTRAMUSCULAR | Status: DC
  Administered 2018-04-06: 5000 [IU] via SUBCUTANEOUS
  Filled 2018-04-06: qty 1

## 2018-04-06 MED ORDER — MORPHINE SULFATE (PF) 4 MG/ML IV SOLN
INTRAVENOUS | Status: AC
  Administered 2018-04-06: 4 mg via INTRAVENOUS
  Filled 2018-04-06: qty 1

## 2018-04-06 NOTE — ED Provider Notes (Signed)
[facility] Emergency Department Provider Note    First MD Initiated Contact with Patient 04/06/18 0209     (approximate)  I have reviewed the triage vital signs and the nursing notes.   HISTORY  Chief Complaint Headache; Post-op Problem; and Hypertension    HPI Marissa [name] is a 42 y.o. female below list of chronic medical conditions including bowel injury during hysterectomy with bowel injury requiring resection, return visit on 03/16/2018 secondary to peritonitis secondary to presents to the emergency department with generalized abdominal pain worse on the right side of the abdomen with pain score of 10 out of 10 and overall feeling unwell.  Patient denies any fever however does admit to chills.  Patient denies any diarrhea however does admit to nausea   Past Medical History:  Diagnosis Date  . Allergy   . Anemia   . Anxiety   . Arthritis   . Back pain, chronic   . Cardiomyopathy, dilated, nonischemic ([place])   . CHF (congestive heart failure) ([place])   . Cholelithiasis   . Coronary artery disease   . Depression   . Diabetes mellitus without complication (HCC)    fasting cbg 50-140s  . Dysrhythmia    tachycardia  . GERD (gastroesophageal reflux disease)    otc meds  . Headache(784.0)   . Hypercholesteremia   . Hyperlipemia   . Hypertension   . Insulin pump in place    pt had insulin pump but it is now removed (02-18-16)  . MI, old    2006  . Migraines    once/month maybe - can last up to two weeks  . Myocardial infarction [facility]) 2006   "due to medication"; no evidence of ischemia or infarction by nuclear stress test '11  . Neck pain, chronic   . Neuropathy   . Pneumonia 2015   [facility]  . Prolonged QT interval syndrome   . Renal insufficiency   . Restless leg syndrome, controlled   . Sepsis ([place]) 2016   secondary to surgery  . Sleep apnea    does not use cpap since losing alot of weight  . Vision loss    due to diabetes    Patient  Active Problem List   Diagnosis Date Noted  . Abdominal wall abscess 04/06/2018  . Chronic superficial gastritis without bleeding 04/02/2018  . Abscess in epidural space of lumbar spine 04/02/2018  . Acute abdomen 03/16/2018  . Anastomotic leak of intestine   . Peritonitis ([place])   . Pelvic pain 03/08/2018  . Impingement syndrome of left shoulder 02/07/2018  . Type 2 diabetes mellitus without complication, without long-term current use of insulin ([place]) 11/28/2017  . Other fatigue 11/02/2017  . Chronic tension-type headache, intractable 09/19/2017  . Primary osteoarthritis of left knee 09/10/2017  . Polyarthralgia 09/10/2017  . Chronic left shoulder pain 09/10/2017  . Restless leg syndrome 05/23/2017  . MI, old 05/23/2017  . Congestive heart failure ([place]) 05/23/2017  . Chronic painful diabetic neuropathy ([place]) 02/06/2017  . Diabetes mellitus ([place]) 11/21/2016  . Obstructive sleep apnea syndrome 11/21/2016  . Dyslipidemia 11/21/2016  . Paresthesia 09/06/2016  . Primary insomnia 03/14/2016  . Complete tear of right rotator cuff 02/01/2016  . Cardiomyopathy ([place]) 04/23/2015  . Long term current use of antibiotics 04/23/2015  . Biliary colic 31/07/1623  . Calculus of gallbladder with acute cholecystitis 04/07/2015  . Bilateral occipital neuralgia 01/21/2015  . Dizziness 01/04/2015  . Headache 01/04/2015  . DDD (degenerative disc disease), cervical  12/18/2014  . DDD (degenerative disc disease), thoracic 12/18/2014  . DDD (degenerative disc disease), lumbosacral 12/18/2014  . Neuropathy due to secondary diabetes ([place]) 12/18/2014  . Migraine 12/18/2014  . Hypercholesterolemia without hypertriglyceridemia 08/28/2014  . HPV test positive 07/17/2014  . Class 2 obesity 04/15/2014  . CAD in native artery 04/15/2014  . Breathlessness on exertion 03/24/2014  . Wound infection complicating hardware ([place]) 02/25/2014  . Nausea alone 02/01/2014  . Abdominal pain, unspecified site 02/01/2014    . Severe sepsis(995.92) 01/30/2014  . History of lumbar laminectomy 01/30/2014  . Acute renal failure ([place]) 01/30/2014  . Anemia 01/30/2014  . HTN (hypertension) 01/30/2014  . DM (diabetes mellitus), type 2 with renal complications ([place]) 19/37/9024  . Wound infection 01/25/2014  . Acute respiratory failure with hypoxia ([place]) 01/25/2014  . Type 2 diabetes mellitus treated with insulin ([place]) 01/13/2014  . Essential (primary) hypertension 01/13/2014  . Difficulty speaking 01/13/2014  . Clinical depression 01/13/2014  . Insulin dependent type 2 diabetes mellitus ([place]) 01/13/2014  . Back pain 11/01/2013  . HNP (herniated nucleus pulposus), lumbar 10/29/2013    Past Surgical History:  Procedure Laterality Date  . APPENDECTOMY N/A 03/08/2018   Procedure: APPENDECTOMY;  Surgeon: Benjaman Kindler, MD;  Location: [facility] ORS;  Service: Gynecology;  Laterality: N/A;  By Dr. Windell Moment  . BACK SURGERY  2011   Lumbar   . BOWEL RESECTION N/A 03/08/2018   Procedure: SMALL BOWEL RESECTION;  Surgeon: Benjaman Kindler, MD;  Location: [facility] ORS;  Service: Gynecology;  Laterality: N/A;  . CARDIAC CATHETERIZATION  07/18/2004   50-60% mid LAD, minor luminal irregularities RCA, normal LM and CX, EF 50-55% [facility])  . carpel tunnel Bilateral   . CERVICAL FUSION  2006  . CHOLECYSTECTOMY N/A 04/08/2015   Procedure: LAPAROSCOPIC CHOLECYSTECTOMY WITH INTRAOPERATIVE CHOLANGIOGRAM;  Surgeon: Florene Glen, MD;  Location: [facility] ORS;  Service: General;  Laterality: N/A;  . COLON RESECTION N/A 03/16/2018   Procedure: COLON RESECTION;  Surgeon: Florene Glen, MD;  Location: [facility] ORS;  Service: General;  Laterality: N/A;  . LUMBAR LAMINECTOMY/DECOMPRESSION MICRODISCECTOMY Right 05/07/2013   Procedure: Right Lumbar one-two laminectomy;  Surgeon: Elaina Hoops, MD;  Location: [place] NEURO ORS;  Service: Neurosurgery;  Laterality: Right;  . LUMBAR LAMINECTOMY/DECOMPRESSION MICRODISCECTOMY Left 10/29/2013    Procedure: Left Lumbar five-Sacral one Laminectomy;  Surgeon: Elaina Hoops, MD;  Location: [place] NEURO ORS;  Service: Neurosurgery;  Laterality: Left;  . LUMBAR WOUND DEBRIDEMENT N/A 01/25/2014   Procedure: LUMBAR WOUND DEBRIDEMENT;  Surgeon: Charlie Pitter, MD;  Location: [place] NEURO ORS;  Service: Neurosurgery;  Laterality: N/A;  . LUMBAR WOUND DEBRIDEMENT N/A 02/25/2014   Procedure: LUMBAR WOUND DEBRIDEMENT;  Surgeon: Elaina Hoops, MD;  Location: [place] NEURO ORS;  Service: Neurosurgery;  Laterality: N/A;  . LYSIS OF ADHESION N/A 03/08/2018   Procedure: LYSIS OF ADHESION;  Surgeon: Benjaman Kindler, MD;  Location: [facility] ORS;  Service: Gynecology;  Laterality: N/A;  . SALPINGOOPHORECTOMY Bilateral    1 1997. 2nd 2001  . SHOULDER ARTHROSCOPY WITH BICEPSTENOTOMY Left 03/24/2016   Procedure: shoulder arthroscopy with biceps TENOTOMY, removal loose body, limited synovectomy;  Surgeon: Leanor Kail, MD;  Location: [facility] ORS;  Service: Orthopedics;  Laterality: Left;  . shoulder sugery     7/17 rotator cuff  . SMALL BOWEL REPAIR N/A 03/08/2018   Procedure: SMALL BOWEL REPAIR;  Surgeon: Benjaman Kindler, MD;  Location: [facility] ORS;  Service: Gynecology;  Laterality: N/A;    Prior to Admission medications  Medication Sig Start Date End Date Taking? Authorizing Provider  acetaminophen (TYLENOL) 500 MG tablet Take 1,000 mg by mouth 2 (two) times daily as needed for moderate pain or headache.    [provider]  amitriptyline (ELAVIL) 50 MG tablet Take 50 mg by mouth at bedtime.    [provider]  atorvastatin (LIPITOR) 40 MG tablet Take 40 mg by mouth at bedtime.  11/11/17   [provider]  BIOTIN PO Take 1 tablet by mouth daily.    [provider]  carvedilol (COREG) 25 MG tablet Take 1 tablet (25 mg total) by mouth 2 (two) times daily with a meal. 02/04/14   Buriev, Arie Sabina, MD  Cholecalciferol (VITAMIN D3) 50000 units TABS Take 500,000 Units by mouth 2 (two) times a week.     [provider]  cloNIDine (CATAPRES) 0.2 MG tablet Take 0.2 mg by mouth 2 (two) times daily. 02/10/16   [provider]  cyanocobalamin (,VITAMIN B-12,) 1000 MCG/ML injection Inject 1,000 mcg into the muscle every 30 (thirty) days. 12/25/17   [provider]  docusate sodium (COLACE) 100 MG capsule Take 1 capsule (100 mg total) by mouth 2 (two) times daily. 03/13/18   Herbert Pun, MD  gabapentin (NEURONTIN) 300 MG capsule Take 600-1,200 mg by mouth See admin instructions. Take 600 mg in the morning and afternoon, then take 1200 mg at bedtime    [provider]  insulin aspart (NOVOLOG FLEXPEN) 100 UNIT/ML FlexPen Inject 10-20 Units into the skin 3 (three) times daily with meals. Sliding scale Patient taking differently: Inject 2-6 Units into the skin daily with supper. Sliding scale 02/04/14   Kinnie Feil, MD  levocetirizine (XYZAL) 5 MG tablet Take 5 mg by mouth daily as needed for allergies.    [provider]  lidocaine (LIDODERM) 5 % Place 1 patch onto the skin every 12 (twelve) hours. Remove & Discard patch within 12 hours or as directed by MD 07/04/17 07/04/18  Laban Emperor, PA-C  lisinopril (PRINIVIL,ZESTRIL) 40 MG tablet Take 40 mg by mouth daily.    [provider]  meclizine (ANTIVERT) 25 MG tablet Take 1 tablet (25 mg total) by mouth 3 (three) times daily as needed for dizziness. 12/12/17   Hedges, Dellis Filbert, PA-C  meloxicam (MOBIC) 15 MG tablet Take 1 tablet (15 mg total) by mouth daily. 11/26/17   Cuthriell, Charline Bills, PA-C  NARCAN 4 MG/0.1ML LIQD nasal spray kit Place 1 spray into the nose once. If no response after 2 minutes repeat in opposite nostril. 01/15/18   [provider]  nitroGLYCERIN (NITROSTAT) 0.4 MG SL tablet Place 0.4 mg under the tongue every 5 (five) minutes as needed for chest pain.    [provider]  OVER THE COUNTER MEDICATION Take 1 tablet by mouth daily. Mag O7 otc supplement     [provider]  oxyCODONE-acetaminophen (PERCOCET/ROXICET) 5-325 MG tablet Take 1-2 tablets by mouth every 6 (six) hours as needed for moderate pain. 03/20/18   Florene Glen, MD  rOPINIRole (REQUIP) 0.5 MG tablet Take 0.5-1.5 mg by mouth See admin instructions. Take 0.5 mg in the morning and 1.5 mg at night    [provider]  simethicone (MYLICON) 80 MG chewable tablet Chew 1 tablet (80 mg total) by mouth 4 (four) times daily as needed for flatulence. 03/21/18   Florene Glen, MD  tiZANidine (ZANAFLEX) 4 MG tablet Limit 1 tab by mouth per day or 2-3 times per day if  tolerated Patient taking differently: Take 4 mg by mouth 3 (three) times daily.  04/19/16   Mohammed Kindle, MD  topiramate (TOPAMAX) 50 MG tablet Take 50-100 mg by mouth See admin instructions. Take 50 mg in the morning and 100 mg at night    [provider]  traZODone (DESYREL) 50 MG tablet Take 50 mg by mouth at bedtime.  11/26/17   [provider]    Allergies Bactrim [sulfamethoxazole-trimethoprim]; Ciprofloxacin; Vancomycin; Amoxicillin; Azithromycin; Ceftriaxone; and Food  Family History  Problem Relation Age of Onset  . Depression Mother   . Drug abuse Mother   . Early death Mother   . Hypertension Mother   . Varicose Veins Mother   . Diabetes Father   . Early death Father   . Hyperlipidemia Father   . Heart Problems Father        enlarged geart  . Breast cancer Maternal Grandmother        60's  . Breast cancer Maternal Aunt     Social History Social History   Tobacco Use  . Smoking status: Never Smoker  . Smokeless tobacco: Never Used  Substance Use Topics  . Alcohol use: No  . Drug use: No    Review of Systems Constitutional: Positive for chills Eyes: No visual changes. ENT: No sore throat. Cardiovascular: Denies chest pain. Respiratory: Denies shortness of breath. Gastrointestinal: Positive for abdominal pain and nausea Genitourinary: Negative for  dysuria. Musculoskeletal: Negative for neck pain.  Negative for back pain. Integumentary: Negative for rash. Neurological: Negative for headaches, focal weakness or numbness. ________________   PHYSICAL EXAM:  VITAL SIGNS: ED Triage Vitals  Enc Vitals Group     BP 04/05/18 2359 (!) 160/105     Pulse Rate 04/05/18 2359 (!) 123     Resp 04/05/18 2359 16     Temp 04/05/18 2359 98.9 F (37.2 C)     Temp Source 04/05/18 2359 Oral     SpO2 04/05/18 2359 100 %     Weight 04/06/18 0001 67.6 kg (149 lb)     Height 04/06/18 0001 1.638 m (5' 4.5")     Head Circumference --      Peak Flow --      Pain Score 04/06/18 0000 8     Pain Loc --      Pain Edu? --      Excl. in [name]? --     Constitutional: Alert and oriented. Well appearing and in no acute distress. Eyes: Conjunctivae are normal.  Head: Atraumatic. Mouth/Throat: Mucous membranes are moist.  Oropharynx non-erythematous. Neck: No stridor.   Cardiovascular: Normal rate, regular rhythm. Good peripheral circulation. Grossly normal heart sounds. Respiratory: Normal respiratory effort.  No retractions. Lungs CTAB. Gastrointestinal: Positive for right upper/lower and periumbilical abdominal pain.. No distention.  Musculoskeletal: No lower extremity tenderness nor edema. No gross deformities of extremities. Neurologic:  Normal speech and language. No gross focal neurologic deficits are appreciated.  Skin:  Skin is warm, dry and intact. No rash noted. Psychiatric: Mood and affect are normal. Speech and behavior are normal.  ____________________________________________   LABS (all labs ordered are listed, but only abnormal results are displayed)  Labs Reviewed  CBC WITH DIFFERENTIAL/PLATELET - Abnormal; Notable for the following components:      Result Value   Hemoglobin 11.6 (*)    HCT 33.3 (*)    All other components within normal limits  COMPREHENSIVE METABOLIC PANEL - Abnormal; Notable for the following components:    Potassium  3.4 (*)    Glucose, Bld 194 (*)    Albumin 3.4 (*)    All other components within normal limits  LIPASE, BLOOD - Abnormal; Notable for the following components:   Lipase 57 (*)    All other components within normal limits   ____________________________________________  EKG   RADIOLOGY I, Pope N Lonni Dirden, personally viewed and evaluated these images (plain radiographs) as part of my medical decision making, as well as reviewing the written report by the radiologist.  ED MD interpretation:    Official radiology report(s): Ct Abdomen Pelvis W Contrast  Result Date: 04/06/2018 CLINICAL DATA:  Headache, feeling unwell. Status post appendectomy and small-bowel repair March 08, 2018 and colon resection March 16, 2018. Pain at surgical site. History of cholecystectomy. EXAM: CT ABDOMEN AND PELVIS WITH CONTRAST TECHNIQUE: Multidetector CT imaging of the abdomen and pelvis was performed using the standard protocol following bolus administration of intravenous contrast. CONTRAST:  16m ISOVUE-300 IOPAMIDOL (ISOVUE-300) INJECTION 61% COMPARISON:  CT abdomen and pelvis March 16, 2018. FINDINGS: LOWER CHEST: Lung bases are clear. Included heart size is normal. No pericardial effusion. HEPATOBILIARY: Status post cholecystectomy. Focal fatty infiltration about the falciform ligament. Negative liver. PANCREAS: Normal. SPLEEN: Normal. ADRENALS/URINARY TRACT: Kidneys are orthotopic, demonstrating symmetric enhancement. No nephrolithiasis, hydronephrosis or solid renal masses. Too small to characterize hypodensities bilateral kidneys. The unopacified ureters are normal in course and caliber. Delayed imaging through the kidneys demonstrates symmetric prompt contrast excretion within the proximal urinary collecting system. Urinary bladder is partially distended and unremarkable. 1 cm suspected LEFT adrenal adenoma. STOMACH/BOWEL: Small bowel wall thickening and edema proximal to bowel anastomosis with  dilated small bowel to 3.1 cm. Proximal small bowel feces. Moderate amount of retained large bowel stool. VASCULAR/LYMPHATIC: Aortoiliac vessels are normal in course and caliber. Moderate calcific atherosclerosis. No lymphadenopathy by CT size criteria. REPRODUCTIVE: Normal. OTHER: Contiguous with the anterior aspect of small bowel surgical anastomosis is a 2 x 3.1 x 3.2 cm irregular rim enhancing intraperitoneal fluid collection extending through the anterior abdominal wall into the subcutaneous fat with gas and overlying skin staples. Potential early enterocutaneous fistula. Small volume free fluid RIGHT pelvis. No intraperitoneal free air. MUSCULOSKELETAL: Enlarged edematous lower anterior rectus muscles, potential myositis. Status post L5-S1 PLIF with arthrodesis. Degenerative change of the spine superimposed on congenital canal narrowing. Moderate canal stenosis L1-2. IMPRESSION: 1. 2 x 3.1 x 3.2 cm early abscess contiguous with small bowel surgical anastomosis concerning for leak. Abscess extends into the ventral abdominal wall with probable rectus abdominus myositis. 2. Low-grade small bowel obstruction, transition point at surgical anastomosis. 3. Moderate amount of retained large bowel stool. 4. Acute findings [facility] with and [facility] on 04/06/2018 at 1:58 am. Aortic Atherosclerosis (ICD10-I70.0). Electronically Signed   By: CElon AlasM.D.   On: 04/06/2018 02:00     Procedures   ____________________________________________   INITIAL IMPRESSION / ASSESSMENT AND PLAN / ED COURSE  As part of my medical decision making, I reviewed the following data within the electronic MEDICAL RECORD NUMBER   42year old female presenting with above-stated history and physical exam concerning for possible intra-abdominal pathology including abscess formation and anastomotic leak with resultant peritonitis.  Clinical suspicion was confirmed on CT scan of the abdomen pelvis.  Secondary  to patient's extensive allergy list patient will be given gentamicin and Flagyl which I discussed with surgery Pabon who is agreeable to.  Patient will be admitted to Dr. PDahlia Byesfor further evaluation and management  ____________________________________________  FINAL CLINICAL IMPRESSION(S) / ED DIAGNOSES  Final diagnoses:  Peritonitis ([place])     MEDICATIONS GIVEN DURING THIS VISIT:  Medications  metroNIDAZOLE (FLAGYL) IVPB 1,000 mg (has no administration in time range)  gentamicin (GARAMYCIN) 140 mg in dextrose 5 % 50 mL IVPB (has no administration in time range)  morphine 4 MG/ML injection 4 mg (4 mg Intravenous Given 04/06/18 0100)  ondansetron (ZOFRAN) injection 4 mg (4 mg Intravenous Given 04/06/18 0100)  morphine 4 MG/ML injection 4 mg (4 mg Intravenous Given 04/06/18 0124)  iopamidol (ISOVUE-300) 61 % injection 100 mL (100 mLs Intravenous Contrast Given 04/06/18 0124)  ondansetron (ZOFRAN) 4 MG/2ML injection (4 mg  Given 04/06/18 0215)     ED Discharge Orders    None       Note:  This document was prepared using Dragon voice recognition software and may include unintentional dictation errors.    Gregor Hams, MD 04/06/18 [id number]

## 2018-04-06 NOTE — ED Notes (Signed)
Per MD Owens Shark, attempt dilaudid for pain control in attempt of lowering BP

## 2018-04-06 NOTE — ED Notes (Signed)
Pt will not be accepted to the floor until blood pressure is addressed per receiving RN

## 2018-04-06 NOTE — Progress Notes (Signed)
Dr Dahlia Byes notified bp 80/50, pt c/o ears stopped up, states can't hear  very well, orders given.

## 2018-04-06 NOTE — H&P (Signed)
Patient ID: Marissa [name], female   DOB: 1976/06/08, 42 y.o.   MRN: [id number]  HPI Marissa [name] is a 42 y.o. female well known to our service. She had a prior iatrogenic enterotomy 4 weeks ago ( Dr Leafy Ro Hysterectomy)  that required SB resection and primary anastomosis ( Dr. Peyton Najjar). THis was complicated by a post op bowel obstruction  ( internal hernia) and she was re intervene a few weeks later by DR. Cooper. ( negative laparotomy , anastomosis found patent w/o leak) SHe recovered well. Early this am she came via the ER for abdominal pain and nausea. She describes the pain as intermittent moderate and sharp in nature. No specific allevating or aggravating factors. She did have one episode of emesis. Labs reviewed and were unremarkable. CT scan personally reviewed and d/w Dr. Barbie Banner from IR, there is currently no drainable collections at this time. There is a phlegmon rather than a true abscess within the abdominal wall. No free air.  HPI  Past Medical History:  Diagnosis Date  . Allergy   . Anemia   . Anxiety   . Arthritis   . Back pain, chronic   . Cardiomyopathy, dilated, nonischemic ([place])   . CHF (congestive heart failure) ([place])   . Cholelithiasis   . Coronary artery disease   . Depression   . Diabetes mellitus without complication (HCC)    fasting cbg 50-140s  . Dysrhythmia    tachycardia  . GERD (gastroesophageal reflux disease)    otc meds  . Headache(784.0)   . Hypercholesteremia   . Hyperlipemia   . Hypertension   . Insulin pump in place    pt had insulin pump but it is now removed (02-18-16)  . MI, old    2006  . Migraines    once/month maybe - can last up to two weeks  . Myocardial infarction [facility]) 2006   "due to medication"; no evidence of ischemia or infarction by nuclear stress test '11  . Neck pain, chronic   . Neuropathy   . Pneumonia 2015   [facility]  . Prolonged QT interval syndrome   . Renal insufficiency   . Restless leg syndrome, controlled   .  Sepsis ([place]) 2016   secondary to surgery  . Sleep apnea    does not use cpap since losing alot of weight  . Vision loss    due to diabetes    Past Surgical History:  Procedure Laterality Date  . APPENDECTOMY N/A 03/08/2018   Procedure: APPENDECTOMY;  Surgeon: Benjaman Kindler, MD;  Location: [facility] ORS;  Service: Gynecology;  Laterality: N/A;  By Dr. Windell Moment  . BACK SURGERY  2011   Lumbar   . BOWEL RESECTION N/A 03/08/2018   Procedure: SMALL BOWEL RESECTION;  Surgeon: Benjaman Kindler, MD;  Location: [facility] ORS;  Service: Gynecology;  Laterality: N/A;  . CARDIAC CATHETERIZATION  07/18/2004   50-60% mid LAD, minor luminal irregularities RCA, normal LM and CX, EF 50-55% [facility])  . carpel tunnel Bilateral   . CERVICAL FUSION  2006  . CHOLECYSTECTOMY N/A 04/08/2015   Procedure: LAPAROSCOPIC CHOLECYSTECTOMY WITH INTRAOPERATIVE CHOLANGIOGRAM;  Surgeon: Florene Glen, MD;  Location: [facility] ORS;  Service: General;  Laterality: N/A;  . COLON RESECTION N/A 03/16/2018   Procedure: COLON RESECTION;  Surgeon: Florene Glen, MD;  Location: [facility] ORS;  Service: General;  Laterality: N/A;  . LUMBAR LAMINECTOMY/DECOMPRESSION MICRODISCECTOMY Right 05/07/2013   Procedure: Right Lumbar one-two laminectomy;  Surgeon: Elaina Hoops, MD;  Location: [place] NEURO ORS;  Service: Neurosurgery;  Laterality: Right;  . LUMBAR LAMINECTOMY/DECOMPRESSION MICRODISCECTOMY Left 10/29/2013   Procedure: Left Lumbar five-Sacral one Laminectomy;  Surgeon: Elaina Hoops, MD;  Location: [place] NEURO ORS;  Service: Neurosurgery;  Laterality: Left;  . LUMBAR WOUND DEBRIDEMENT N/A 01/25/2014   Procedure: LUMBAR WOUND DEBRIDEMENT;  Surgeon: Charlie Pitter, MD;  Location: [place] NEURO ORS;  Service: Neurosurgery;  Laterality: N/A;  . LUMBAR WOUND DEBRIDEMENT N/A 02/25/2014   Procedure: LUMBAR WOUND DEBRIDEMENT;  Surgeon: Elaina Hoops, MD;  Location: [place] NEURO ORS;  Service: Neurosurgery;  Laterality: N/A;  . LYSIS OF ADHESION N/A  03/08/2018   Procedure: LYSIS OF ADHESION;  Surgeon: Benjaman Kindler, MD;  Location: [facility] ORS;  Service: Gynecology;  Laterality: N/A;  . SALPINGOOPHORECTOMY Bilateral    1 1997. 2nd 2001  . SHOULDER ARTHROSCOPY WITH BICEPSTENOTOMY Left 03/24/2016   Procedure: shoulder arthroscopy with biceps TENOTOMY, removal loose body, limited synovectomy;  Surgeon: Leanor Kail, MD;  Location: [facility] ORS;  Service: Orthopedics;  Laterality: Left;  . shoulder sugery     7/17 rotator cuff  . SMALL BOWEL REPAIR N/A 03/08/2018   Procedure: SMALL BOWEL REPAIR;  Surgeon: Benjaman Kindler, MD;  Location: [facility] ORS;  Service: Gynecology;  Laterality: N/A;    Family History  Problem Relation Age of Onset  . Depression Mother   . Drug abuse Mother   . Early death Mother   . Hypertension Mother   . Varicose Veins Mother   . Diabetes Father   . Early death Father   . Hyperlipidemia Father   . Heart Problems Father        enlarged geart  . Breast cancer Maternal Grandmother        60's  . Breast cancer Maternal Aunt     Social History Social History   Tobacco Use  . Smoking status: Never Smoker  . Smokeless tobacco: Never Used  Substance Use Topics  . Alcohol use: No  . Drug use: No    Allergies  Allergen Reactions  . Bactrim [Sulfamethoxazole-Trimethoprim] Nausea And Vomiting  . Ciprofloxacin Swelling  . Vancomycin Nausea And Vomiting  . Amoxicillin Nausea And Vomiting    Has patient had a PCN reaction causing immediate rash, facial/tongue/throat swelling, SOB or lightheadedness with hypotension: No Has patient had a PCN reaction causing severe rash involving mucus membranes or skin necrosis: No Has patient had a PCN reaction that required hospitalization: No Has patient had a PCN reaction occurring within the last 10 years: Yes If all of the above answers are "NO", then may proceed with Cephalosporin use.   . Azithromycin Itching and Rash  . Ceftriaxone Nausea And Vomiting  . Food Itching  and Rash    "[place] yogurt"    Current Facility-Administered Medications  Medication Dose Route Frequency Provider Last Rate Last Dose  . gentamicin (GARAMYCIN) 140 mg in dextrose 5 % 50 mL IVPB  2 mg/kg Intravenous Q8H Pabon, Diego F, MD 107 mL/hr at 04/06/18 1003 140 mg at 04/06/18 1003  . HYDROcodone-acetaminophen (NORCO/VICODIN) 5-325 MG per tablet 1-2 tablet  1-2 tablet Oral Q4H PRN Jules Husbands, MD   1 tablet at 04/06/18 0848  . ketorolac (TORADOL) 30 MG/ML injection 30 mg  30 mg Intravenous Q6H PRN Pabon, Diego F, MD      . lactated ringers infusion   Intravenous Continuous Jules Husbands, MD 125 mL/hr at 04/06/18 0739    . metroNIDAZOLE (FLAGYL) IVPB 1,000 mg  1,000 mg  Intravenous Q8H Pabon, Diego F, MD      . morphine 4 MG/ML injection 4 mg  4 mg Intravenous Q3H PRN Pabon, Diego F, MD   4 mg at 04/06/18 1018  . ondansetron (ZOFRAN-ODT) disintegrating tablet 4 mg  4 mg Oral Q6H PRN Pabon, Diego F, MD       Or  . ondansetron (ZOFRAN) injection 4 mg  4 mg Intravenous Q6H PRN Pabon, Diego F, MD      . pantoprazole (PROTONIX) EC tablet 40 mg  40 mg Oral Daily Pabon, Diego F, MD      . prochlorperazine (COMPAZINE) tablet 10 mg  10 mg Oral Q6H PRN Pabon, Diego F, MD       Or  . prochlorperazine (COMPAZINE) injection 5-10 mg  5-10 mg Intravenous Q6H PRN Pabon, Diego F, MD         Review of Systems Full ROS  was asked and was negative except for the information on the HPI  Physical Exam Blood pressure (!) 134/91, pulse (!) 108, temperature 98 F (36.7 C), temperature source Oral, resp. rate 18, height 5\' 4"  (1.626 m), weight 66.9 kg, last menstrual period 12/21/2014, SpO2 98 %. CONSTITUTIONAL: NAD EYES: Pupils are equal, round, and reactive to light, Sclera are non-icteric. EARS, NOSE, MOUTH AND THROAT: The oropharynx is clear. The oral mucosa is pink and moist. Hearing is intact to voice. LYMPH NODES:  Lymph nodes in the neck are normal. RESPIRATORY:  Lungs are clear. There is  normal respiratory effort, with equal breath sounds bilaterally, and without pathologic use of accessory muscles. CARDIOVASCULAR: Heart is regular without murmurs, gallops, or rubs. GI: The abdomen is soft, moderate tenderness on lower abdominal wall, No peritonitis. I removed A few staples hoping to drain the abdominal wall abscess seen on the CT scan but there is no tracks and no communication yet that will allow me to drain the abscess at bedside. The rest of the incision is healing well, there is a few staples left in place. There are normal bowel sounds.  GU: Rectal deferred.   MUSCULOSKELETAL: Normal muscle strength and tone. No cyanosis or edema.   SKIN: Turgor is good and there are no pathologic skin lesions or ulcers. NEUROLOGIC: Motor and sensation is grossly normal. Cranial nerves are grossly intact. PSYCH:  Oriented to person, place and time. Affect is normal.  Data Reviewed  I have personally reviewed the patient's imaging, laboratory findings and medical records.    Assessment/Plan Abdominal wall phlegmon. No Discrete identifiable drainable collection on CT or PE.  Looking at her whole picture I do not think that she has an anastomotic leak but rather a phlegmon within the abdominal wall that can be treated with antibiotics. We will treat with antibiotics, aggressive fluid resuscitation, NPO and serial abdominal exams. NO need for emergent surgical intervention at this time. She is not toxic or peritonitic currently. D/W the pt in detail and with Dr. Barbie Banner from IR.  Caroleen Hamman, MD FACS General Surgeon 04/06/2018, 10:46 AM

## 2018-04-07 LAB — URINALYSIS, COMPLETE (UACMP) WITH MICROSCOPIC
BILIRUBIN URINE: NEGATIVE
Bacteria, UA: NONE SEEN
Glucose, UA: NEGATIVE mg/dL
HGB URINE DIPSTICK: NEGATIVE
Ketones, ur: NEGATIVE mg/dL
LEUKOCYTES UA: NEGATIVE
Nitrite: NEGATIVE
PROTEIN: NEGATIVE mg/dL
Specific Gravity, Urine: 1.028 (ref 1.005–1.030)
pH: 5 (ref 5.0–8.0)

## 2018-04-07 LAB — BASIC METABOLIC PANEL
Anion gap: 3 — ABNORMAL LOW (ref 5–15)
BUN: 11 mg/dL (ref 6–20)
CALCIUM: 8.3 mg/dL — AB (ref 8.9–10.3)
CO2: 28 mmol/L (ref 22–32)
Chloride: 109 mmol/L (ref 98–111)
Creatinine, Ser: 0.72 mg/dL (ref 0.44–1.00)
GFR calc non Af Amer: >60 mL/min (ref >60)
GLUCOSE: 116 mg/dL — AB (ref 70–99)
Potassium: 4.1 mmol/L (ref 3.5–5.1)
Sodium: 140 mmol/L (ref 135–145)

## 2018-04-07 LAB — CBC
HEMATOCRIT: 26.3 % — AB (ref 35.0–47.0)
Hemoglobin: 8.9 g/dL — ABNORMAL LOW (ref 12.0–16.0)
MCH: 29.5 pg (ref 26.0–34.0)
MCHC: 33.9 g/dL (ref 32.0–36.0)
MCV: 86.8 fL (ref 80.0–100.0)
Platelets: 145 10*3/uL — ABNORMAL LOW (ref 150–440)
RBC: 3.03 MIL/uL — ABNORMAL LOW (ref 3.80–5.20)
RDW: 14.5 % (ref 11.5–14.5)
WBC: 4 10*3/uL (ref 3.6–11.0)

## 2018-04-07 MED ORDER — CLINDAMYCIN PHOSPHATE 600 MG/50ML IV SOLN
600.0000 mg | Freq: Four times a day (QID) | INTRAVENOUS | Status: DC
  Administered 2018-04-07 – 2018-04-08 (×5): 600 mg via INTRAVENOUS
  Filled 2018-04-07 (×7): qty 50

## 2018-04-07 MED ORDER — PREMIER PROTEIN SHAKE
11.0000 [oz_av] | Freq: Two times a day (BID) | ORAL | Status: DC
  Administered 2018-04-08 – 2018-04-11 (×2): 11 [oz_av] via ORAL

## 2018-04-07 NOTE — Progress Notes (Signed)
CC: Phlegmon Subjective: Feeling better. There was some transient hypotension likely from a combination of narcotics and antihypertensive. She receive 500cc bolus with adequate response. She feels well this am. Taking full liquids AVSS Wbc nml Drop Hg likely from dilution. No clinical evidence of bleeding Good UO VSS Hearing issues resolved  Objective: Vital signs in last 24 hours: Temp:  [97.4 F (36.3 C)-98.2 F (36.8 C)] 98.2 F (36.8 C) (08/25 0457) Pulse Rate:  [67-95] 95 (08/25 1040) Resp:  [16-18] 18 (08/25 0457) BP: (73-132)/(50-92) 132/92 (08/25 1040) SpO2:  [98 %-99 %] 98 % (08/25 0457) Last BM Date: 04/05/18  Intake/Output from previous day: 08/24 0701 - 08/25 0700 In: 1788.8 [I.V.:734.2; IV Piggyback:1054.6] Out: 225 [Urine:225] Intake/Output this shift: No intake/output data recorded.  Physical exam: NAD, awake and alert Abd: soft, scant drainage from lower part of incision. No evidence of drainable collection. Some induration but no erythema Ext: no edema, well perfused  Lab Results: CBC  Recent Labs    04/06/18 0011 04/07/18 0536  WBC 7.7 4.0  HGB 11.6* 8.9*  HCT 33.3* 26.3*  PLT 231 145*   BMET Recent Labs    04/06/18 0011 04/07/18 0536  NA 140 140  K 3.4* 4.1  CL 105 109  CO2 25 28  GLUCOSE 194* 116*  BUN 9 11  CREATININE 0.67 0.72  CALCIUM 9.1 8.3*   PT/INR Recent Labs    04/06/18 1139  LABPROT 13.9  INR 1.08   ABG No results for input(s): PHART, HCO3 in the last 72 hours.  Invalid input(s): PCO2, PO2  Studies/Results: Ct Abdomen Pelvis W Contrast  Result Date: 04/06/2018 CLINICAL DATA:  Headache, feeling unwell. Status post appendectomy and small-bowel repair March 08, 2018 and colon resection March 16, 2018. Pain at surgical site. History of cholecystectomy. EXAM: CT ABDOMEN AND PELVIS WITH CONTRAST TECHNIQUE: Multidetector CT imaging of the abdomen and pelvis was performed using the standard protocol following bolus  administration of intravenous contrast. CONTRAST:  117mL ISOVUE-300 IOPAMIDOL (ISOVUE-300) INJECTION 61% COMPARISON:  CT abdomen and pelvis March 16, 2018. FINDINGS: LOWER CHEST: Lung bases are clear. Included heart size is normal. No pericardial effusion. HEPATOBILIARY: Status post cholecystectomy. Focal fatty infiltration about the falciform ligament. Negative liver. PANCREAS: Normal. SPLEEN: Normal. ADRENALS/URINARY TRACT: Kidneys are orthotopic, demonstrating symmetric enhancement. No nephrolithiasis, hydronephrosis or solid renal masses. Too small to characterize hypodensities bilateral kidneys. The unopacified ureters are normal in course and caliber. Delayed imaging through the kidneys demonstrates symmetric prompt contrast excretion within the proximal urinary collecting system. Urinary bladder is partially distended and unremarkable. 1 cm suspected LEFT adrenal adenoma. STOMACH/BOWEL: Small bowel wall thickening and edema proximal to bowel anastomosis with dilated small bowel to 3.1 cm. Proximal small bowel feces. Moderate amount of retained large bowel stool. VASCULAR/LYMPHATIC: Aortoiliac vessels are normal in course and caliber. Moderate calcific atherosclerosis. No lymphadenopathy by CT size criteria. REPRODUCTIVE: Normal. OTHER: Contiguous with the anterior aspect of small bowel surgical anastomosis is a 2 x 3.1 x 3.2 cm irregular rim enhancing intraperitoneal fluid collection extending through the anterior abdominal wall into the subcutaneous fat with gas and overlying skin staples. Potential early enterocutaneous fistula. Small volume free fluid RIGHT pelvis. No intraperitoneal free air. MUSCULOSKELETAL: Enlarged edematous lower anterior rectus muscles, potential myositis. Status post L5-S1 PLIF with arthrodesis. Degenerative change of the spine superimposed on congenital canal narrowing. Moderate canal stenosis L1-2. IMPRESSION: 1. 2 x 3.1 x 3.2 cm early abscess contiguous with small bowel surgical  anastomosis concerning for  leak. Abscess extends into the ventral abdominal wall with probable rectus abdominus myositis. 2. Low-grade small bowel obstruction, transition point at surgical anastomosis. 3. Moderate amount of retained large bowel stool. 4. Acute findings discussed with and reconfirmed by [facility] on 04/06/2018 at 1:58 am. Aortic Atherosclerosis (ICD10-I70.0). Electronically Signed   By: Elon Alas M.D.   On: 04/06/2018 02:00    Anti-infectives: Anti-infectives (From admission, onward)   Start     Dose/Rate Route Frequency Ordered Stop   04/06/18 1230  metroNIDAZOLE (FLAGYL) IVPB 1,000 mg     1,000 mg 200 mL/hr over 60 Minutes Intravenous Every 8 hours 04/06/18 0503     04/06/18 1045  gentamicin (GARAMYCIN) 140 mg in dextrose 5 % 50 mL IVPB     2 mg/kg  67.6 kg 107 mL/hr over 30 Minutes Intravenous Every 8 hours 04/06/18 0503     04/06/18 0215  metroNIDAZOLE (FLAGYL) IVPB 1,000 mg  Status:  Discontinued     1,000 mg 200 mL/hr over 60 Minutes Intravenous  Once 04/06/18 0202 04/06/18 0615   04/06/18 0215  gentamicin (GARAMYCIN) 140 mg in dextrose 5 % 50 mL IVPB  Status:  Discontinued     2 mg/kg  67.6 kg 107 mL/hr over 30 Minutes Intravenous  Once 04/06/18 0202 04/06/18 0017      Assessment/Plan:  Phlegmon abd wall Continue a/bs DC rest of staples No surgical intervention Hold antihypertensive drugs Regular diet  Caroleen Hamman, MD, FACS  04/07/2018

## 2018-04-07 NOTE — Progress Notes (Signed)
Initial Nutrition Assessment  DOCUMENTATION CODES:   Severe malnutrition in context of chronic illness  INTERVENTION:  Provide Premier Protein po BID, each supplement provides 160 kcal and 30 grams of protein.  Encouraged intake of small, frequent meals throughout the day in setting of decreased appetite and early satiety. Discussed choosing an adequate source of protein at each meal/snack and discussed which foods contain protein.  Patient has 1 year follow-up with bariatric surgeon in 05/2018. Discussed recommendation to have her vitamin labs checked at this appointment. Encouraged follow-up with her outpatient RD in setting of significant weight loss, malnutrition.  After discharge resume post-bariatric surgery vitamin/mineral supplementation. Discussed the importance of taking these supplements for life.  NUTRITION DIAGNOSIS:   Severe Malnutrition related to chronic illness(sleeve gastrectomy 05/31/2017, SBR + LOA 7/26, LOA + reduction of internal hernia 8/3, poor appetite, inadequate oral intake) as evidenced by severe fat depletion, moderate muscle depletion, severe muscle depletion.  GOAL:   Patient will meet greater than or equal to 90% of their needs  MONITOR:   PO intake, Supplement acceptance, Labs, Weight trends, I & O's  REASON FOR ASSESSMENT:   Malnutrition Screening Tool    ASSESSMENT:   42 year old female with PMHx of HTN, CAD, DM, neuropathy, hx MI 2006, GERD, migraines, HLD, depression, anxiety, sleep apnea, CHF, renal insufficiency, and surgical history that includes laparoscopic sleeve gastrectomy 05/31/2017 at [facility], small bowel resection with anastamosis and extensive lysis of adhesions and incidental appendectomy on 03/08/2018 in setting of small bowel injury/iatrogenic enterotomy, exploratory laparotomy with extensive lysis of adhesions and reduction of internal hernia on 03/16/2018 in setting of SBO secondary to internal hernia. Patient now admitted with  abdominal wall phlegmon.   -Diet advanced to clear liquids then full liquids last night. Advanced to regular this AM.  Met with patient at bedside. She reports she has had a poor appetite over the past month. She is experiencing anorexia (absence of hunger) and early satiety. She reports her poor appetite has been since her operation at the end of July, but this is all complicated by the fact that she had a bariatric surgery in October 2018. She is only able to eat small amounts at meals. She reports she has been surviving on bites of chicken nuggets lately as that is one of the only things she can tolerate. She still drinks Premier Protein but is struggling to only drink one per week. She reports she is not able to keep up with her liquid or protein intake. She is not sure which stage bariatric diet she is on now but reports she has been cleared to add most foods back into diet. She does not tolerate Ensure well and prefers Premier.  Patient reports her UBW was around 221 lbs (100.45 kg) prior to her sleeve gastrectomy. Per chart she was 97 kg on 05/31/2017 (day of sleeve gastrectomy). She is currently 66.9 kg. She has lost 30.1 kg (31% body weight) over the past 10 months, which is significant for time frame. Patient is concerned that she has lost too much weight now and much of her muscle mass.  Medications reviewed and include: gabapentin, pantoprazole, clindamycin, gentamicin, LR @ 50 mL/hr.  Labs reviewed.  NUTRITION - FOCUSED PHYSICAL EXAM:    Most Recent Value  Orbital Region  Severe depletion  Upper Arm Region  Severe depletion  Thoracic and Lumbar Region  Moderate depletion  Buccal Region  Severe depletion  Temple Region  Moderate depletion  Clavicle Bone Region  Severe depletion  Clavicle and Acromion Bone Region  Severe depletion  Scapular Bone Region  Severe depletion  Dorsal Hand  Moderate depletion  Patellar Region  Severe depletion  Anterior Thigh Region  Severe depletion   Posterior Calf Region  Severe depletion  Edema (RD Assessment)  None  Hair  Reviewed  Eyes  Reviewed  Mouth  Reviewed  Skin  Reviewed  Nails  Reviewed     Diet Order:   Diet Order            Diet regular Room service appropriate? Yes; Fluid consistency: Thin  Diet effective now              EDUCATION NEEDS:   Education needs have been addressed  Skin:  Skin Assessment: Reviewed RN Assessment  Last BM:  PTA (04/05/2018 per chart)  Height:   Ht Readings from Last 1 Encounters:  04/06/18 '5\' 4"'  (1.626 m)    Weight:   Wt Readings from Last 1 Encounters:  04/06/18 66.9 kg    Ideal Body Weight:  54.5 kg  BMI:  Body mass index is 25.32 kg/m.  Estimated Nutritional Needs:   Kcal:  1580-1845 (MSJ x 1.2-1.4)  Protein:  85-100 grams (1.3-1.5 grams/kg)  Fluid:  1.5-1.8 L/day (1 mL/kcal)  Willey Blade, MS, RD, LDN Office: [phone] Pager: [phone] After Hours/Weekend Pager: [phone]

## 2018-04-07 NOTE — Progress Notes (Signed)
Patient had one urine output today with 300 ml per nurse tech report, patient bladder scanned and showed total of 364 ml post voiding the 300 ml. Dr. Dahlia Byes notified and gave order to continue to monitor and no need for in and out cath at this time. Will continue to monitor patient.

## 2018-04-08 LAB — CBC
HCT: 28.7 % — ABNORMAL LOW (ref 35.0–47.0)
HEMOGLOBIN: 9.7 g/dL — AB (ref 12.0–16.0)
MCH: 29.1 pg (ref 26.0–34.0)
MCHC: 33.9 g/dL (ref 32.0–36.0)
MCV: 85.9 fL (ref 80.0–100.0)
Platelets: 157 10*3/uL (ref 150–440)
RBC: 3.34 MIL/uL — AB (ref 3.80–5.20)
RDW: 14.4 % (ref 11.5–14.5)
WBC: 4.8 10*3/uL (ref 3.6–11.0)

## 2018-04-08 LAB — BASIC METABOLIC PANEL
Anion gap: 7 (ref 5–15)
BUN: 13 mg/dL (ref 6–20)
CHLORIDE: 107 mmol/L (ref 98–111)
CO2: 28 mmol/L (ref 22–32)
CREATININE: 0.67 mg/dL (ref 0.44–1.00)
Calcium: 8.2 mg/dL — ABNORMAL LOW (ref 8.9–10.3)
GFR calc Af Amer: >60 mL/min (ref >60)
GFR calc non Af Amer: >60 mL/min (ref >60)
Glucose, Bld: 118 mg/dL — ABNORMAL HIGH (ref 70–99)
Potassium: 3.9 mmol/L (ref 3.5–5.1)
SODIUM: 142 mmol/L (ref 135–145)

## 2018-04-08 MED ORDER — PIPERACILLIN-TAZOBACTAM 3.375 G IVPB
3.3750 g | Freq: Three times a day (TID) | INTRAVENOUS | Status: DC
  Administered 2018-04-08 – 2018-04-11 (×8): 3.375 g via INTRAVENOUS
  Filled 2018-04-08 (×9): qty 50

## 2018-04-08 MED ORDER — ACETAMINOPHEN 325 MG PO TABS: 650.0000 mg | ORAL_TABLET | Freq: Four times a day (QID) | ORAL | Status: DC | PRN

## 2018-04-08 NOTE — Progress Notes (Signed)
Patient ID: [name], female   DOB: 12-Jul-1976, 42 y.o.   MRN: [id number]     [place] [facility] Day(s): 2.   Post op day(s):  Marland Kitchen   Interval History: Patient seen and examined, no acute events or new complaints overnight. Patient reports still with pain and feeling weak. Denies fever or chills. .  Vital signs in last 24 hours: [min-max] current  Temp:  [98.5 F (36.9 C)-98.6 F (37 C)] 98.5 F (36.9 C) (08/25 2020) Pulse Rate:  [85-99] 99 (08/25 2020) Resp:  [18-20] 20 (08/25 2020) BP: (103-133)/(64-85) 133/85 (08/25 2020) SpO2:  [97 %-98 %] 98 % (08/25 2020)     Height: 5\' 4"  (162.6 cm) Weight: 66.9 kg BMI (Calculated): 25.31   Intake/Output this shift:  Total I/O In: 150.8 [I.V.:100.8; IV Piggyback:50] Out: 500 [Urine:500]    Physical Exam:  Constitutional: alert, cooperative and no distress  Respiratory: breathing non-labored at rest  Cardiovascular: regular rate and sinus rhythm  Gastrointestinal: soft, non-tender, and non-distended. Three small wound openings with purulent secretions. No cellulitis.   Labs:  CBC Latest Ref Rng & Units 04/08/2018 04/07/2018 04/06/2018  WBC 3.6 - 11.0 K/uL 4.8 4.0 7.7  Hemoglobin 12.0 - 16.0 g/dL 9.7(L) 8.9(L) 11.6(L)  Hematocrit 35.0 - 47.0 % 28.7(L) 26.3(L) 33.3(L)  Platelets 150 - 440 K/uL 157 145(L) 231   CMP Latest Ref Rng & Units 04/08/2018 04/07/2018 04/06/2018  Glucose 70 - 99 mg/dL 118(H) 116(H) 194(H)  BUN 6 - 20 mg/dL 13 11 9   Creatinine 0.44 - 1.00 mg/dL 0.67 0.72 0.67  Sodium 135 - 145 mmol/L 142 140 140  Potassium 3.5 - 5.1 mmol/L 3.9 4.1 3.4(L)  Chloride 98 - 111 mmol/L 107 109 105  CO2 22 - 32 mmol/L 28 28 25   Calcium 8.9 - 10.3 mg/dL 8.2(L) 8.3(L) 9.1  Total Protein 6.5 - 8.1 g/dL - - 7.5  Total Bilirubin 0.3 - 1.2 mg/dL - - 0.6  Alkaline Phos 38 - 126 U/L - - 75  AST 15 - 41 U/L - - 25  ALT 0 - 44 U/L - - 13   Imaging studies: No new pertinent imaging studies  Assessment/Plan:  42 y.o. female  with known history of small bowel injury during gynecological surgery. The patient developed an internal hernia status post re opening of recent laparotomy and lysis of adhesions but no anastomosis leak as concerned by CT scan.   Again CT scan with findings worrisome of leak from anastomosis due to phlegmon process on the abdominal wall. On physical exam there is no sign of enteric content that would raise the concern of a fistula. The WBC are normal. Patient has been tolerating regular diet and no increase on wound drainage output. The drainage is purulent. With patient previous history of negative leak on previous surgery, now no sign of obstruction or sepsis. Will continue conservative treatment with IV abx therapy until the drainage is better. Agree with current IV abx clinda and genta. Local care given today by me.   Arnold Long, MD

## 2018-04-09 ENCOUNTER — Encounter: Payer: Self-pay | Admitting: Surgery

## 2018-04-09 NOTE — Progress Notes (Signed)
Nutrition Follow Up Note   DOCUMENTATION CODES:   Severe malnutrition in context of chronic illness  INTERVENTION:   Premier Protein po BID, each supplement provides 160 kcal and 30 grams of protein.  NUTRITION DIAGNOSIS:   Severe Malnutrition related to chronic illness(sleeve gastrectomy 05/31/2017, SBR + LOA 7/26, LOA + reduction of internal hernia 8/3, poor appetite, inadequate oral intake) as evidenced by severe fat depletion, moderate muscle depletion, severe muscle depletion.  GOAL:   Patient will meet greater than or equal to 90% of their needs  -progressing   MONITOR:   PO intake, Supplement acceptance, Labs, Weight trends, I & O's  ASSESSMENT:   42 year old female with PMHx of HTN, CAD, DM, neuropathy, hx MI 2006, GERD, migraines, HLD, depression, anxiety, sleep apnea, CHF, renal insufficiency, and surgical history that includes laparoscopic sleeve gastrectomy 05/31/2017 at [facility], small bowel resection with anastamosis and extensive lysis of adhesions and incidental appendectomy on 03/08/2018 in setting of small bowel injury/iatrogenic enterotomy, exploratory laparotomy with extensive lysis of adhesions and reduction of internal hernia on 03/16/2018 in setting of SBO secondary to internal hernia. Patient now admitted with abdominal wall phlegmon.   Pt advanced to regular diet 8/25. Pt with improved appetite and oral intake; eating 75% of meals today but refusing most of her Premier Protein. No new weight since 8/24. Per diet order, pt is a vegetarian; however, pt has ordered bacon on some of her meal trays so she may not be a strict vegetarian. Recommend to encourage supplements as pt has increased estimated needs from protein.   Medications reviewed and include: gabapentin, pantoprazole, heparin, LR @ 50 mL/hr, zosyn  Labs reviewed:   Diet Order:   Diet Order            Diet regular Room service appropriate? Yes; Fluid consistency: Thin  Diet effective now             EDUCATION NEEDS:   Education needs have been addressed  Skin:  Skin Assessment: Reviewed RN Assessment  Last BM:  8/27- type 7  Height:   Ht Readings from Last 1 Encounters:  04/06/18 5\' 4"  (1.626 m)    Weight:   Wt Readings from Last 1 Encounters:  04/06/18 66.9 kg    Ideal Body Weight:  54.5 kg  BMI:  Body mass index is 25.32 kg/m.  Estimated Nutritional Needs:   Kcal:  1580-1845 (MSJ x 1.2-1.4)  Protein:  85-100 grams (1.3-1.5 grams/kg)  Fluid:  1.5-1.8 L/day (1 mL/kcal)  Koleen Distance MS, RD, LDN Pager #- [phone] Office#- [phone] After Hours Pager: [phone]

## 2018-04-09 NOTE — Progress Notes (Signed)
Pt stated she takes insulin at home and regularly checks BG level. Pt currently on regular diet. Pt stated not to worry about ordering BG checks, change diet, and or order insulin as she wouldl refuse. Contacted Dr. Windell Moment to make aware of the situation.

## 2018-04-09 NOTE — Care Management Important Message (Signed)
Copy of signed IM left with patient in room.  

## 2018-04-10 NOTE — Progress Notes (Signed)
Patient ID: Marissa [name], female   DOB: Feb 26, 1976, 42 y.o.   MRN: [id number]     [place] [facility] Day(s): 4.   Post op day(s):  Marland Kitchen   Interval History: Patient seen and examined, no acute events or new complaints overnight. Patient reports had bleeding from the wound yesterday, denies nausea or vomiting.  Vital signs in last 24 hours: [min-max] current  Temp:  [98.2 F (36.8 C)-99.2 F (37.3 C)] 98.2 F (36.8 C) (08/28 0503) Pulse Rate:  [83-91] 83 (08/28 0503) BP: (144-161)/(89-99) 145/99 (08/28 0503) SpO2:  [99 %-100 %] 99 % (08/28 0503)     Height: 5\' 4"  (162.6 cm) Weight: 66.9 kg BMI (Calculated): 25.31   Intake/Output this shift:  Total I/O In: 245.8 [I.V.:199.1; IV Piggyback:46.7] Out: -    Physical Exam:  Constitutional: alert, cooperative and no distress  Respiratory: breathing non-labored at rest  Cardiovascular: regular rate and sinus rhythm  Gastrointestinal: soft, non-tender, and non-distended. Fours wound openings. The one in the middle was the one with bleeding. The most inferior opening with significant drainage.   Labs:  CBC Latest Ref Rng & Units 04/08/2018 04/07/2018 04/06/2018  WBC 3.6 - 11.0 K/uL 4.8 4.0 7.7  Hemoglobin 12.0 - 16.0 g/dL 9.7(L) 8.9(L) 11.6(L)  Hematocrit 35.0 - 47.0 % 28.7(L) 26.3(L) 33.3(L)  Platelets 150 - 440 K/uL 157 145(L) 231   CMP Latest Ref Rng & Units 04/08/2018 04/07/2018 04/06/2018  Glucose 70 - 99 mg/dL 118(H) 116(H) 194(H)  BUN 6 - 20 mg/dL 13 11 9   Creatinine 0.44 - 1.00 mg/dL 0.67 0.72 0.67  Sodium 135 - 145 mmol/L 142 140 140  Potassium 3.5 - 5.1 mmol/L 3.9 4.1 3.4(L)  Chloride 98 - 111 mmol/L 107 109 105  CO2 22 - 32 mmol/L 28 28 25   Calcium 8.9 - 10.3 mg/dL 8.2(L) 8.3(L) 9.1  Total Protein 6.5 - 8.1 g/dL - - 7.5  Total Bilirubin 0.3 - 1.2 mg/dL - - 0.6  Alkaline Phos 38 - 126 U/L - - 75  AST 15 - 41 U/L - - 25  ALT 0 - 44 U/L - - 13    Imaging studies: No new pertinent imaging  studies   Assessment/Plan:  42 y.o. female with known history of small bowel injury during gynecological surgery. The patient developed an internal hernia status post re opening of recent laparotomy and lysis of adhesions but no anastomosis leak as concerned by CT scan.   Abdomina wound infection  - Still with significant purulent secretions with need of frequent dressing change and IV antibiotic therapy.  - Tolerating diet and no increase in drainage.   - Local care given to the wound. Still trying to find a simple local care that patient can do at home when drainage improves. Due to bleeding yesterday and persistent inferior wound drainage, Vaseline gauze was placed on the upper opening to avoid sticking to the wound and bleeding. The inferior wound was covered and will follow the amount of dressing changes and drainage.   Arnold Long, MD

## 2018-04-11 LAB — CBC
HEMATOCRIT: 30.1 % — AB (ref 35.0–47.0)
HEMOGLOBIN: 10 g/dL — AB (ref 12.0–16.0)
MCH: 28.9 pg (ref 26.0–34.0)
MCHC: 33.3 g/dL (ref 32.0–36.0)
MCV: 86.8 fL (ref 80.0–100.0)
Platelets: 174 10*3/uL (ref 150–440)
RBC: 3.47 MIL/uL — ABNORMAL LOW (ref 3.80–5.20)
RDW: 14.6 % — ABNORMAL HIGH (ref 11.5–14.5)
WBC: 4.4 10*3/uL (ref 3.6–11.0)

## 2018-04-11 MED ORDER — AMOXICILLIN-POT CLAVULANATE 875-125 MG PO TABS: 1.0000 | ORAL_TABLET | Freq: Two times a day (BID) | ORAL | 0 refills | Status: AC

## 2018-04-11 MED ORDER — SILVER NITRATE-POT NITRATE 75-25 % EX MISC
1.0000 | CUTANEOUS | Status: DC
  Administered 2018-04-11: 1 via TOPICAL
  Filled 2018-04-11: qty 1

## 2018-04-11 NOTE — Care Management (Signed)
RNCM spoke with patient regarding home health and co-pays ([facility] cannot take her, [facility] does not have RN, and [facility] said a waiver would need to be signed).  Patient states she can do her own dressing changes if RN will supply med and dressing and instructions.  RNCM spoke with MD and he will order Silver Nitrate from inpatient pharmacy for use.  RN updated and agrees.

## 2018-04-11 NOTE — Care Management Important Message (Signed)
Copy of signed IM left with patient in room.  

## 2018-04-11 NOTE — Discharge Instructions (Signed)
°  Diet: Resume home heart healthy regular diet.   Activity: No heavy lifting >20 pounds (children, pets, laundry, garbage) or strenuous activity until follow-up, but light activity and walking are encouraged. Do not drive or drink alcohol if taking narcotic pain medications.  Wound care: Remove dressing bedore showering, may shower with soapy water and pat dry (do not rub incisions), but no baths or submerging incision underwater until follow-up. (no swimming)   Will coordinate home care to aid in the wound care  Medications: Resume all home medications.   Call office [phone]) at any time if any questions, worsening pain, fevers/chills, bleeding, drainage from incision site, or other concerns.

## 2018-04-11 NOTE — Discharge Summary (Signed)
Physician Discharge Summary  Patient ID: Marissa [name] MRN: [id number] DOB/AGE: 1975/11/06 42 y.o.  Admit date: 04/06/2018 Discharge date: 04/11/2018  Admission Diagnoses: Wound infection with abdominal wall abscess  Discharge Diagnoses:  Active Problems:   Abdominal wall abscess   Discharged Condition: good  Hospital Course: Patient admitted with abdominal wall abscess close to the anastomosis. Anastomosis leak was suspected as in the past admission but the response to antibiotic, de decrease in wound drainage, patient tolerating diet, passing gas and bowel movement without increased WBC count was more consistent with deep wound infection. Loca care to wound given every day. Drainage from wound decreased significantly now controlled with gauze instead of lage dressing pads. No fever during admission, normal heart rate and blood pressure. No leukocytosis. Tolerating regular post bariatric diet.   Consults: None  Significant Diagnostic Studies: labs:  CBC Latest Ref Rng & Units 04/11/2018 04/08/2018 04/07/2018  WBC 3.6 - 11.0 K/uL 4.4 4.8 4.0  Hemoglobin 12.0 - 16.0 g/dL 10.0(L) 9.7(L) 8.9(L)  Hematocrit 35.0 - 47.0 % 30.1(L) 28.7(L) 26.3(L)  Platelets 150 - 440 K/uL 174 157 145(L)    Treatments: antibiotics: Zosyn  Discharge Exam: Blood pressure 122/82, pulse 79, temperature 97.8 F (36.6 C), temperature source Oral, resp. rate 18, height _0  (1.626 m), weight 66.9 kg, last menstrual period 12/21/2014, SpO2 100 %. General appearance: alert and cooperative Resp: clear to auscultation bilaterally Cardio: regular rate and rhythm, S1, S2 normal, no murmur, click, rub or gallop GI: soft, non-tender; bowel sounds normal; no masses,  no organomegaly Incision/Wound:There are multiple wound opening around 0.5 - 1.0 cm with granulation tissue and small tract. The larger wound is on the inferior aspect from previous penrose that still with drainage.   Disposition: Discharge disposition:  01-Home or Self Care       Discharge Instructions    Diet - low sodium heart healthy   Complete by:  As directed    Increase activity slowly   Complete by:  As directed      Allergies as of 04/11/2018      Reactions   Bactrim [sulfamethoxazole-trimethoprim] Nausea And Vomiting   Ciprofloxacin Swelling   Vancomycin Nausea And Vomiting   Amoxicillin Nausea And Vomiting   Patient tolerated Zosyn during the whole admission Has patient had a PCN reaction causing immediate rash, facial/tongue/throat swelling, SOB or lightheadedness with hypotension: No Has patient had a PCN reaction causing severe rash involving mucus membranes or skin necrosis: No Has patient had a PCN reaction that required hospitalization: No Has patient had a PCN reaction occurring within the last 10 years: Yes If all of the above answers are "NO", then may proceed with Cephalosporin use.   Azithromycin Itching, Rash   Ceftriaxone Nausea And Vomiting   Food Itching, Rash   "Greek yogurt"      Medication List    TAKE these medications   acetaminophen 500 MG tablet Commonly known as:  TYLENOL Take 1,000 mg by mouth 2 (two) times daily as needed for moderate pain or headache.   amitriptyline 50 MG tablet Commonly known as:  ELAVIL Take 50 mg by mouth at bedtime.   amoxicillin-clavulanate 875-125 MG tablet Commonly known as:  AUGMENTIN Take 1 tablet by mouth 2 (two) times daily for 7 days.   atorvastatin 40 MG tablet Commonly known as:  LIPITOR Take 40 mg by mouth at bedtime.   BIOTIN PO Take 1 tablet by mouth daily.   carvedilol 25 MG tablet Commonly known as:  COREG Take 1 tablet (25 mg total) by mouth 2 (two) times daily with a meal.   cloNIDine 0.2 MG tablet Commonly known as:  CATAPRES Take 0.2 mg by mouth 2 (two) times daily.   cyanocobalamin 1000 MCG/ML injection Commonly known as:  (VITAMIN B-12) Inject 1,000 mcg into the muscle every 30 (thirty) days.   docusate sodium 100 MG  capsule Commonly known as:  COLACE Take 1 capsule (100 mg total) by mouth 2 (two) times daily.   gabapentin 300 MG capsule Commonly known as:  NEURONTIN Take 600-1,200 mg by mouth See admin instructions. Take 600 mg in the morning and afternoon, then take 1200 mg at bedtime   insulin aspart 100 UNIT/ML FlexPen Commonly known as:  NOVOLOG Inject 10-20 Units into the skin 3 (three) times daily with meals. Sliding scale What changed:    how much to take  when to take this   levocetirizine 5 MG tablet Commonly known as:  XYZAL Take 5 mg by mouth daily as needed for allergies.   lidocaine 5 % Commonly known as:  LIDODERM Place 1 patch onto the skin every 12 (twelve) hours. Remove & Discard patch within 12 hours or as directed by MD   lisinopril 40 MG tablet Commonly known as:  PRINIVIL,ZESTRIL Take 40 mg by mouth daily.   meclizine 25 MG tablet Commonly known as:  ANTIVERT Take 1 tablet (25 mg total) by mouth 3 (three) times daily as needed for dizziness.   meloxicam 15 MG tablet Commonly known as:  MOBIC Take 1 tablet (15 mg total) by mouth daily.   NARCAN 4 MG/0.1ML Liqd nasal spray kit Generic drug:  naloxone Place 1 spray into the nose once. If no response after 2 minutes repeat in opposite nostril.   nitroGLYCERIN 0.4 MG SL tablet Commonly known as:  NITROSTAT Place 0.4 mg under the tongue every 5 (five) minutes as needed for chest pain.   OVER THE COUNTER MEDICATION Take 1 tablet by mouth daily. Mag O7 otc supplement   oxyCODONE-acetaminophen 5-325 MG tablet Commonly known as:  PERCOCET/ROXICET Take 1-2 tablets by mouth every 6 (six) hours as needed for moderate pain.   rOPINIRole 0.5 MG tablet Commonly known as:  REQUIP Take 0.5-1.5 mg by mouth See admin instructions. Take 0.5 mg in the morning and 1.5 mg at night   simethicone 80 MG chewable tablet Commonly known as:  MYLICON Chew 1 tablet (80 mg total) by mouth 4 (four) times daily as needed for  flatulence.   tiZANidine 4 MG tablet Commonly known as:  ZANAFLEX Limit 1 tab by mouth per day or 2-3 times per day if tolerated What changed:    how much to take  how to take this  when to take this  additional instructions   topiramate 50 MG tablet Commonly known as:  TOPAMAX Take 50-100 mg by mouth See admin instructions. Take 50 mg in the morning and 100 mg at night   traZODone 50 MG tablet Commonly known as:  DESYREL Take 50 mg by mouth at bedtime.   Vitamin D3 50000 units Tabs Take 500,000 Units by mouth 2 (two) times a week.      Follow-up Information    Herbert Pun, MD Follow up in 1 week(s).   Specialty:  General Surgery Contact information: [place] [address] [phone]           Signed: Herbert Pun 04/11/2018, 9:31 AM

## 2018-04-18 DIAGNOSIS — S46002S Unspecified injury of muscle(s) and tendon(s) of the rotator cuff of left shoulder, sequela: Secondary | ICD-10-CM | POA: Diagnosis not present

## 2018-04-18 DIAGNOSIS — Z5181 Encounter for therapeutic drug level monitoring: Secondary | ICD-10-CM | POA: Diagnosis not present

## 2018-04-18 DIAGNOSIS — M19012 Primary osteoarthritis, left shoulder: Secondary | ICD-10-CM | POA: Diagnosis not present

## 2018-04-18 DIAGNOSIS — M7542 Impingement syndrome of left shoulder: Secondary | ICD-10-CM | POA: Diagnosis not present

## 2018-05-09 DIAGNOSIS — T148XXA Other injury of unspecified body region, initial encounter: Secondary | ICD-10-CM | POA: Diagnosis not present

## 2018-05-09 DIAGNOSIS — Z09 Encounter for follow-up examination after completed treatment for conditions other than malignant neoplasm: Secondary | ICD-10-CM | POA: Diagnosis not present

## 2018-05-09 DIAGNOSIS — L089 Local infection of the skin and subcutaneous tissue, unspecified: Secondary | ICD-10-CM | POA: Diagnosis not present

## 2018-06-20 DIAGNOSIS — E1159 Type 2 diabetes mellitus with other circulatory complications: Secondary | ICD-10-CM | POA: Diagnosis not present

## 2018-06-20 DIAGNOSIS — E785 Hyperlipidemia, unspecified: Secondary | ICD-10-CM | POA: Diagnosis not present

## 2018-06-20 DIAGNOSIS — E1169 Type 2 diabetes mellitus with other specified complication: Secondary | ICD-10-CM | POA: Diagnosis not present

## 2018-06-20 DIAGNOSIS — Z794 Long term (current) use of insulin: Secondary | ICD-10-CM | POA: Diagnosis not present

## 2018-06-20 DIAGNOSIS — I10 Essential (primary) hypertension: Secondary | ICD-10-CM | POA: Diagnosis not present

## 2018-06-20 DIAGNOSIS — E1142 Type 2 diabetes mellitus with diabetic polyneuropathy: Secondary | ICD-10-CM | POA: Diagnosis not present

## 2018-06-27 DIAGNOSIS — E113293 Type 2 diabetes mellitus with mild nonproliferative diabetic retinopathy without macular edema, bilateral: Secondary | ICD-10-CM | POA: Diagnosis not present

## 2018-06-28 ENCOUNTER — Emergency Department
Admission: EM | Admit: 2018-06-28 | Discharge: 2018-06-28 | Disposition: A | Discharge status: Home / Self Care | Payer: Medicare HMO | Attending: Emergency Medicine | Admitting: Emergency Medicine

## 2018-06-28 ENCOUNTER — Other Ambulatory Visit: Payer: Self-pay

## 2018-06-28 DIAGNOSIS — R319 Hematuria, unspecified: Secondary | ICD-10-CM | POA: Diagnosis present

## 2018-06-28 DIAGNOSIS — N3001 Acute cystitis with hematuria: Secondary | ICD-10-CM

## 2018-06-28 DIAGNOSIS — Z794 Long term (current) use of insulin: Secondary | ICD-10-CM | POA: Insufficient documentation

## 2018-06-28 DIAGNOSIS — I2510 Atherosclerotic heart disease of native coronary artery without angina pectoris: Secondary | ICD-10-CM | POA: Insufficient documentation

## 2018-06-28 DIAGNOSIS — Z79899 Other long term (current) drug therapy: Secondary | ICD-10-CM | POA: Diagnosis not present

## 2018-06-28 DIAGNOSIS — I509 Heart failure, unspecified: Secondary | ICD-10-CM | POA: Diagnosis not present

## 2018-06-28 DIAGNOSIS — E119 Type 2 diabetes mellitus without complications: Secondary | ICD-10-CM | POA: Insufficient documentation

## 2018-06-28 DIAGNOSIS — I252 Old myocardial infarction: Secondary | ICD-10-CM | POA: Insufficient documentation

## 2018-06-28 DIAGNOSIS — I110 Hypertensive heart disease with heart failure: Secondary | ICD-10-CM | POA: Insufficient documentation

## 2018-06-28 LAB — CBC
HEMATOCRIT: 39.6 % (ref 36.0–46.0)
HEMOGLOBIN: 12.5 g/dL (ref 12.0–15.0)
MCH: 27.8 pg (ref 26.0–34.0)
MCHC: 31.6 g/dL (ref 30.0–36.0)
MCV: 88.2 fL (ref 80.0–100.0)
Platelets: 178 10*3/uL (ref 150–400)
RBC: 4.49 MIL/uL (ref 3.87–5.11)
RDW: 13.8 % (ref 11.5–15.5)
WBC: 4.2 10*3/uL (ref 4.0–10.5)
nRBC: 0 % (ref 0.0–0.2)

## 2018-06-28 LAB — BASIC METABOLIC PANEL
ANION GAP: 8 (ref 5–15)
BUN: 11 mg/dL (ref 6–20)
CHLORIDE: 104 mmol/L (ref 98–111)
CO2: 28 mmol/L (ref 22–32)
Calcium: 9.4 mg/dL (ref 8.9–10.3)
Creatinine, Ser: 0.69 mg/dL (ref 0.44–1.00)
GFR calc Af Amer: >60 mL/min (ref >60)
GFR calc non Af Amer: >60 mL/min (ref >60)
GLUCOSE: 99 mg/dL (ref 70–99)
POTASSIUM: 3.9 mmol/L (ref 3.5–5.1)
Sodium: 140 mmol/L (ref 135–145)

## 2018-06-28 LAB — URINALYSIS, COMPLETE (UACMP) WITH MICROSCOPIC
Bilirubin Urine: NEGATIVE
GLUCOSE, UA: NEGATIVE mg/dL
Ketones, ur: NEGATIVE mg/dL
Leukocytes, UA: NEGATIVE
NITRITE: POSITIVE — AB
PH: 6 (ref 5.0–8.0)
Protein, ur: 30 mg/dL — AB
RBC / HPF: >50 RBC/hpf — ABNORMAL HIGH (ref 0–5)
SPECIFIC GRAVITY, URINE: 1.021 (ref 1.005–1.030)

## 2018-06-28 MED ORDER — CEPHALEXIN 500 MG PO CAPS: 500.0000 mg | ORAL_CAPSULE | Freq: Two times a day (BID) | ORAL | 0 refills | Status: DC

## 2018-06-28 MED ORDER — SODIUM CHLORIDE 0.9 % IV SOLN
1.0000 g | Freq: Once | INTRAVENOUS | Status: AC
  Administered 2018-06-28: 1 g via INTRAVENOUS
  Filled 2018-06-28: qty 10

## 2018-06-28 MED ORDER — ONDANSETRON HCL 4 MG/2ML IJ SOLN
4.0000 mg | Freq: Once | INTRAMUSCULAR | Status: AC
  Administered 2018-06-28: 4 mg via INTRAVENOUS
  Filled 2018-06-28: qty 2

## 2018-06-28 MED ORDER — TRAMADOL HCL 50 MG PO TABS: 50.0000 mg | ORAL_TABLET | Freq: Once | ORAL | Status: DC

## 2018-06-28 NOTE — Discharge Instructions (Addendum)
You are evaluated for lower abdominal pain and bloody urine and found to have a urinary tract infection.  You were given IV dose of Rocephin antibiotic which covers for 24 hours.  Start your antibiotic tomorrow.  As we discussed, nausea and vomiting was likely side effect as opposed to a allergic reaction.  Please discontinue antibiotic and be evaluated if you develop any signs of allergic reaction such as itching, rash, trouble breathing, wheezing, trouble swallowing, or any other symptoms concerning to you.  Return to the emergency department immediately for any worsening condition including fever, new or worsening abdominal pain, vomiting Keep medication down, or any other symptoms concerning to you.

## 2018-06-28 NOTE — ED Provider Notes (Signed)
[facility] Emergency Department Provider Note ____________________________________________   I have reviewed the triage vital signs and the triage nursing note.  HISTORY  Chief Complaint Hematuria   Historian Patient  HPI Marissa [name] is a 42 y.o. female   presents with lower abdominal pain which is moderate in intensity up to 7 out of 10 in pain.  She has a foul smell when she urinates.  She reports hematuria.  Symptoms for 24 hours.  No fever.  No back pain or flank pain.  No trouble breathing.  No dizziness or passing out.  History of hysterectomy followed by bowel surgery after complication from the initial GYN surgery with last surgery done at the end of August.  No bowel changes.    Past Medical History:  Diagnosis Date  . Allergy   . Anemia   . Anxiety   . Arthritis   . Back pain, chronic   . Cardiomyopathy, dilated, nonischemic ([place])   . CHF (congestive heart failure) ([place])   . Cholelithiasis   . Coronary artery disease   . Depression   . Diabetes mellitus without complication (HCC)    fasting cbg 50-140s  . Dysrhythmia    tachycardia  . GERD (gastroesophageal reflux disease)    otc meds  . Headache(784.0)   . Hypercholesteremia   . Hyperlipemia   . Hypertension   . Insulin pump in place    pt had insulin pump but it is now removed (02-18-16)  . MI, old    2006  . Migraines    once/month maybe - can last up to two weeks  . Myocardial infarction [facility]) 2006   "due to medication"; no evidence of ischemia or infarction by nuclear stress test '11  . Neck pain, chronic   . Neuropathy   . Pneumonia 2015   [facility]  . Prolonged QT interval syndrome   . Renal insufficiency   . Restless leg syndrome, controlled   . Sepsis ([place]) 2016   secondary to surgery  . Sleep apnea    does not use cpap since losing alot of weight  . Vision loss    due to diabetes    Patient Active Problem List   Diagnosis Date Noted  . Abdominal wall  abscess 04/06/2018  . Chronic superficial gastritis without bleeding 04/02/2018  . Abscess in epidural space of lumbar spine 04/02/2018  . Acute abdomen 03/16/2018  . Anastomotic leak of intestine   . Peritonitis ([place])   . Pelvic pain 03/08/2018  . Impingement syndrome of left shoulder 02/07/2018  . Type 2 diabetes mellitus without complication, without long-term current use of insulin ([place]) 11/28/2017  . Other fatigue 11/02/2017  . Chronic tension-type headache, intractable 09/19/2017  . Primary osteoarthritis of left knee 09/10/2017  . Polyarthralgia 09/10/2017  . Chronic left shoulder pain 09/10/2017  . Restless leg syndrome 05/23/2017  . MI, old 05/23/2017  . Congestive heart failure ([place]) 05/23/2017  . Chronic painful diabetic neuropathy ([place]) 02/06/2017  . Diabetes mellitus ([place]) 11/21/2016  . Obstructive sleep apnea syndrome 11/21/2016  . Dyslipidemia 11/21/2016  . Paresthesia 09/06/2016  . Primary insomnia 03/14/2016  . Complete tear of right rotator cuff 02/01/2016  . Cardiomyopathy ([place]) 04/23/2015  . Long term current use of antibiotics 04/23/2015  . Biliary colic 14/48/1856  . Calculus of gallbladder with acute cholecystitis 04/07/2015  . Bilateral occipital neuralgia 01/21/2015  . Dizziness 01/04/2015  . Headache 01/04/2015  . DDD (degenerative disc disease), cervical 12/18/2014  . DDD (  degenerative disc disease), thoracic 12/18/2014  . DDD (degenerative disc disease), lumbosacral 12/18/2014  . Neuropathy due to secondary diabetes ([place]) 12/18/2014  . Migraine 12/18/2014  . Hypercholesterolemia without hypertriglyceridemia 08/28/2014  . HPV test positive 07/17/2014  . Class 2 obesity 04/15/2014  . CAD in native artery 04/15/2014  . Breathlessness on exertion 03/24/2014  . Wound infection complicating hardware ([place]) 02/25/2014  . Nausea alone 02/01/2014  . Abdominal pain, unspecified site 02/01/2014  . Severe sepsis(995.92) 01/30/2014  . History of lumbar  laminectomy 01/30/2014  . Acute renal failure ([place]) 01/30/2014  . Anemia 01/30/2014  . HTN (hypertension) 01/30/2014  . DM (diabetes mellitus), type 2 with renal complications ([place]) 63/08/6008  . Wound infection 01/25/2014  . Acute respiratory failure with hypoxia ([place]) 01/25/2014  . Type 2 diabetes mellitus treated with insulin ([place]) 01/13/2014  . Essential (primary) hypertension 01/13/2014  . Difficulty speaking 01/13/2014  . Clinical depression 01/13/2014  . Insulin dependent type 2 diabetes mellitus ([place]) 01/13/2014  . Back pain 11/01/2013  . HNP (herniated nucleus pulposus), lumbar 10/29/2013    Past Surgical History:  Procedure Laterality Date  . APPENDECTOMY N/A 03/08/2018   Procedure: APPENDECTOMY;  Surgeon: Benjaman Kindler, MD;  Location: [facility] ORS;  Service: Gynecology;  Laterality: N/A;  By Dr. Windell Moment  . BACK SURGERY  2011   Lumbar   . BOWEL RESECTION N/A 03/08/2018   Procedure: SMALL BOWEL RESECTION;  Surgeon: Benjaman Kindler, MD;  Location: [facility] ORS;  Service: Gynecology;  Laterality: N/A;  . CARDIAC CATHETERIZATION  07/18/2004   50-60% mid LAD, minor luminal irregularities RCA, normal LM and CX, EF 50-55% [facility])  . carpel tunnel Bilateral   . CERVICAL FUSION  2006  . CHOLECYSTECTOMY N/A 04/08/2015   Procedure: LAPAROSCOPIC CHOLECYSTECTOMY WITH INTRAOPERATIVE CHOLANGIOGRAM;  Surgeon: Florene Glen, MD;  Location: [facility] ORS;  Service: General;  Laterality: N/A;  . COLON RESECTION N/A 03/16/2018   Procedure: COLON RESECTION;  Surgeon: Florene Glen, MD;  Location: [facility] ORS;  Service: General;  Laterality: N/A;  . LUMBAR LAMINECTOMY/DECOMPRESSION MICRODISCECTOMY Right 05/07/2013   Procedure: Right Lumbar one-two laminectomy;  Surgeon: Elaina Hoops, MD;  Location: [place] NEURO ORS;  Service: Neurosurgery;  Laterality: Right;  . LUMBAR LAMINECTOMY/DECOMPRESSION MICRODISCECTOMY Left 10/29/2013   Procedure: Left Lumbar five-Sacral one Laminectomy;   Surgeon: Elaina Hoops, MD;  Location: [place] NEURO ORS;  Service: Neurosurgery;  Laterality: Left;  . LUMBAR WOUND DEBRIDEMENT N/A 01/25/2014   Procedure: LUMBAR WOUND DEBRIDEMENT;  Surgeon: Charlie Pitter, MD;  Location: [place] NEURO ORS;  Service: Neurosurgery;  Laterality: N/A;  . LUMBAR WOUND DEBRIDEMENT N/A 02/25/2014   Procedure: LUMBAR WOUND DEBRIDEMENT;  Surgeon: Elaina Hoops, MD;  Location: [place] NEURO ORS;  Service: Neurosurgery;  Laterality: N/A;  . LYSIS OF ADHESION N/A 03/08/2018   Procedure: LYSIS OF ADHESION;  Surgeon: Benjaman Kindler, MD;  Location: [facility] ORS;  Service: Gynecology;  Laterality: N/A;  . SALPINGOOPHORECTOMY Bilateral    1 1997. 2nd 2001  . SHOULDER ARTHROSCOPY WITH BICEPSTENOTOMY Left 03/24/2016   Procedure: shoulder arthroscopy with biceps TENOTOMY, removal loose body, limited synovectomy;  Surgeon: Leanor Kail, MD;  Location: [facility] ORS;  Service: Orthopedics;  Laterality: Left;  . shoulder sugery     7/17 rotator cuff  . SMALL BOWEL REPAIR N/A 03/08/2018   Procedure: SMALL BOWEL REPAIR;  Surgeon: Benjaman Kindler, MD;  Location: [facility] ORS;  Service: Gynecology;  Laterality: N/A;    Prior to Admission medications   Medication Sig Start Date  End Date Taking? Authorizing Provider  acetaminophen (TYLENOL) 500 MG tablet Take 1,000 mg by mouth 2 (two) times daily as needed for moderate pain or headache.    [provider]  amitriptyline (ELAVIL) 50 MG tablet Take 50 mg by mouth at bedtime.    [provider]  atorvastatin (LIPITOR) 40 MG tablet Take 40 mg by mouth at bedtime.  11/11/17   [provider]  BIOTIN PO Take 1 tablet by mouth daily.    [provider]  carvedilol (COREG) 25 MG tablet Take 1 tablet (25 mg total) by mouth 2 (two) times daily with a meal. 02/04/14   Buriev, Arie Sabina, MD  cephALEXin (KEFLEX) 500 MG capsule Take 1 capsule (500 mg total) by mouth 2 (two) times daily. 06/28/18   Lisa Roca, MD  Cholecalciferol (VITAMIN D3)  50000 units TABS Take 500,000 Units by mouth 2 (two) times a week.    [provider]  cloNIDine (CATAPRES) 0.2 MG tablet Take 0.2 mg by mouth 2 (two) times daily. 02/10/16   [provider]  cyanocobalamin (,VITAMIN B-12,) 1000 MCG/ML injection Inject 1,000 mcg into the muscle every 30 (thirty) days. 12/25/17   [provider]  docusate sodium (COLACE) 100 MG capsule Take 1 capsule (100 mg total) by mouth 2 (two) times daily. 03/13/18   Herbert Pun, MD  gabapentin (NEURONTIN) 300 MG capsule Take 600-1,200 mg by mouth See admin instructions. Take 600 mg in the morning and afternoon, then take 1200 mg at bedtime    [provider]  insulin aspart (NOVOLOG FLEXPEN) 100 UNIT/ML FlexPen Inject 10-20 Units into the skin 3 (three) times daily with meals. Sliding scale Patient taking differently: Inject 2-6 Units into the skin daily with supper. Sliding scale 02/04/14   Kinnie Feil, MD  levocetirizine (XYZAL) 5 MG tablet Take 5 mg by mouth daily as needed for allergies.    [provider]  lidocaine (LIDODERM) 5 % Place 1 patch onto the skin every 12 (twelve) hours. Remove & Discard patch within 12 hours or as directed by MD 07/04/17 07/04/18  Laban Emperor, PA-C  lisinopril (PRINIVIL,ZESTRIL) 40 MG tablet Take 40 mg by mouth daily.    [provider]  meclizine (ANTIVERT) 25 MG tablet Take 1 tablet (25 mg total) by mouth 3 (three) times daily as needed for dizziness. 12/12/17   Hedges, Dellis Filbert, PA-C  meloxicam (MOBIC) 15 MG tablet Take 1 tablet (15 mg total) by mouth daily. 11/26/17   Cuthriell, Charline Bills, PA-C  NARCAN 4 MG/0.1ML LIQD nasal spray kit Place 1 spray into the nose once. If no response after 2 minutes repeat in opposite nostril. 01/15/18   [provider]  nitroGLYCERIN (NITROSTAT) 0.4 MG SL tablet Place 0.4 mg under the tongue every 5 (five) minutes as needed for chest pain.    [provider]  OVER THE COUNTER  MEDICATION Take 1 tablet by mouth daily. Mag O7 otc supplement    [provider]  oxyCODONE-acetaminophen (PERCOCET/ROXICET) 5-325 MG tablet Take 1-2 tablets by mouth every 6 (six) hours as needed for moderate pain. 03/20/18   Florene Glen, MD  rOPINIRole (REQUIP) 0.5 MG tablet Take 0.5-1.5 mg by mouth See admin instructions. Take 0.5 mg in the morning and 1.5 mg at night    [provider]  simethicone (MYLICON) 80 MG chewable tablet Chew 1 tablet (80 mg total) by mouth 4 (four) times daily as needed for flatulence. 03/21/18   Florene Glen,  MD  tiZANidine (ZANAFLEX) 4 MG tablet Limit 1 tab by mouth per day or 2-3 times per day if tolerated Patient taking differently: Take 4 mg by mouth 3 (three) times daily.  04/19/16   Mohammed Kindle, MD  topiramate (TOPAMAX) 50 MG tablet Take 50-100 mg by mouth See admin instructions. Take 50 mg in the morning and 100 mg at night    [provider]  traZODone (DESYREL) 50 MG tablet Take 50 mg by mouth at bedtime.  11/26/17   [provider]    Allergies  Allergen Reactions  . Bactrim [Sulfamethoxazole-Trimethoprim] Nausea And Vomiting  . Ciprofloxacin Swelling  . Vancomycin Nausea And Vomiting  . Amoxicillin Nausea And Vomiting    Patient tolerated Zosyn during the whole admission  Has patient had a PCN reaction causing immediate rash, facial/tongue/throat swelling, SOB or lightheadedness with hypotension: No Has patient had a PCN reaction causing severe rash involving mucus membranes or skin necrosis: No Has patient had a PCN reaction that required hospitalization: No Has patient had a PCN reaction occurring within the last 10 years: Yes If all of the above answers are "NO", then may proceed with Cephalosporin use.   . Azithromycin Itching and Rash  . Ceftriaxone Nausea And Vomiting  . Food Itching and Rash    "[place] yogurt"    Family History  Problem Relation Age of Onset  . Depression Mother   . Drug  abuse Mother   . Early death Mother   . Hypertension Mother   . Varicose Veins Mother   . Diabetes Father   . Early death Father   . Hyperlipidemia Father   . Heart Problems Father        enlarged geart  . Breast cancer Maternal Grandmother        60's  . Breast cancer Maternal Aunt     Social History Social History   Tobacco Use  . Smoking status: Never Smoker  . Smokeless tobacco: Never Used  Substance Use Topics  . Alcohol use: No  . Drug use: No    Review of Systems  Constitutional: Negative for fever. Eyes: Negative for visual changes. ENT: Negative for sore throat. Cardiovascular: Negative for chest pain. Respiratory: Negative for shortness of breath. Gastrointestinal: Negative for vomiting.  Lower abdominal pain as per HPI. Genitourinary: Negative for dysuria, also for hematuria and lower abdominal pain as per HPI. Musculoskeletal: Negative for back pain. Skin: Negative for rash. Neurological: Negative for headache.  ____________________________________________   PHYSICAL EXAM:  VITAL SIGNS: ED Triage Vitals  Enc Vitals Group     BP 06/28/18 0854 129/82     Pulse Rate 06/28/18 0854 (!) 102     Resp 06/28/18 0854 16     Temp 06/28/18 0854 98.4 F (36.9 C)     Temp Source 06/28/18 0854 Oral     SpO2 06/28/18 0854 98 %     Weight 06/28/18 0855 147 lb (66.7 kg)     Height 06/28/18 0855 '5\' 5"'  (1.651 m)     Head Circumference --      Peak Flow --      Pain Score 06/28/18 0855 7     Pain Loc --      Pain Edu? --      Excl. in [place]? --      Constitutional: Alert and oriented.  HEENT      Head: Normocephalic and atraumatic.      Eyes: Conjunctivae are normal. Pupils equal and round.  Ears:         Nose: No congestion/rhinnorhea.      Mouth/Throat: Mucous membranes are moist.      Neck: No stridor. Cardiovascular/Chest: Normal rate, regular rhythm.  No murmurs, rubs, or gallops. Respiratory: Normal respiratory effort without tachypnea nor  retractions. Breath sounds are clear and equal bilaterally. No wheezes/rales/rhonchi. Gastrointestinal: Soft. No distention, no guarding, no rebound.  Moderate tenderness suprapubically. Genitourinary/rectal:Deferred Musculoskeletal: Nontender with normal range of motion in all extremities. No joint effusions.  No lower extremity tenderness.  No edema. Neurologic:  Normal speech and language. No gross or focal neurologic deficits are appreciated. Skin:  Skin is warm, dry and intact. No rash noted. Psychiatric: Mood and affect are normal. Speech and behavior are normal. Patient exhibits appropriate insight and judgment.   ____________________________________________  LABS (pertinent positives/negatives) I, Lisa Roca, MD the attending physician have reviewed the labs noted below.  Labs Reviewed  URINALYSIS, COMPLETE (UACMP) WITH MICROSCOPIC - Abnormal; Notable for the following components:      Result Value   Color, Urine YELLOW (*)    APPearance CLOUDY (*)    Hgb urine dipstick LARGE (*)    Protein, ur 30 (*)    Nitrite POSITIVE (*)    RBC / HPF >50 (*)    WBC, UA >50 (*)    Bacteria, UA MANY (*)    All other components within normal limits  URINE CULTURE  CBC  BASIC METABOLIC PANEL    ____________________________________________    EKG I, Lisa Roca, MD, the attending physician have personally viewed and interpreted all ECGs.  None ____________________________________________  RADIOLOGY   None __________________________________________  PROCEDURES  Procedure(s) performed: None  Procedures  Critical Care performed: None   ____________________________________________  ED COURSE / ASSESSMENT AND PLAN  Pertinent labs & imaging results that were available during my care of the patient were reviewed by me and considered in my medical decision making (see chart for details).     Patient presented with suprapubic discomfort and hematuria, and urinalysis  shows nitrite positive urinary tract infection.  Discussed with the patient, does not appear to be septic.  Normal white blood cell count.  Electrolytes are normal.  We discussed that symptoms seem most consistent with a urinary tract infection rather than any surgical complication related to the most recent abdominal surgery about 8 weeks ago.  We discussed that I do not sterilely recommend any advanced imaging like CT scan for intra-abdominal surgical emergency at this point.  She is in agreement to go ahead and treat the urinary tract infection.  We discussed her multiple antibiotic allergies in the computer of which Cipro was swelling of the lips, but Rocephin was nausea and vomiting when she was septic a few years ago with vancomycin given at the same time.  She did state that she was fine taking this by IV here.  Urine culture was sent.  Patient be discharged with Keflex.  We discussed return precautions.    CONSULTATIONS:   None   Patient / Family / Caregiver informed of clinical course, medical decision-making process, and agree with plan.   I discussed return precautions, follow-up instructions, and discharge instructions with patient and/or family.  Discharge Instructions : You are evaluated for lower abdominal pain and bloody urine and found to have a urinary tract infection.  You were given IV dose of Rocephin antibiotic which covers for 24 hours.  Start your antibiotic tomorrow.  As we discussed, nausea and vomiting was likely side  effect as opposed to a allergic reaction.  Please discontinue antibiotic and be evaluated if you develop any signs of allergic reaction such as itching, rash, trouble breathing, wheezing, trouble swallowing, or any other symptoms concerning to you.  Return to the emergency department immediately for any worsening condition including fever, new or worsening abdominal pain, vomiting Keep medication down, or any other symptoms concerning to  you.    ___________________________________________   FINAL CLINICAL IMPRESSION(S) / ED DIAGNOSES   Final diagnoses:  Acute cystitis with hematuria      ___________________________________________         Note: This dictation was prepared with Dragon dictation. Any transcriptional errors that result from this process are unintentional    Lisa Roca, MD 06/28/18 1212

## 2018-06-28 NOTE — ED Notes (Signed)
FIRST NURSE NOTE:  Pt c/o blood in urine. Pt had 2 abdominal surgeries this year once in July and another in August.

## 2018-06-28 NOTE — ED Triage Notes (Signed)
States hematuria this morning since 3am.   States "I just had surgery in august for a bowel obstruction"   A&O, ambulatory.

## 2018-06-30 LAB — URINE CULTURE: Culture: >=100000 — AB

## 2018-07-16 DIAGNOSIS — M7542 Impingement syndrome of left shoulder: Secondary | ICD-10-CM | POA: Diagnosis not present

## 2018-07-16 DIAGNOSIS — E119 Type 2 diabetes mellitus without complications: Secondary | ICD-10-CM | POA: Diagnosis not present

## 2018-07-16 DIAGNOSIS — Z794 Long term (current) use of insulin: Secondary | ICD-10-CM | POA: Diagnosis not present

## 2018-07-31 DIAGNOSIS — M7542 Impingement syndrome of left shoulder: Secondary | ICD-10-CM | POA: Diagnosis not present

## 2018-07-31 DIAGNOSIS — S46002S Unspecified injury of muscle(s) and tendon(s) of the rotator cuff of left shoulder, sequela: Secondary | ICD-10-CM | POA: Diagnosis not present

## 2018-07-31 DIAGNOSIS — Z5181 Encounter for therapeutic drug level monitoring: Secondary | ICD-10-CM | POA: Diagnosis not present

## 2018-07-31 DIAGNOSIS — M19012 Primary osteoarthritis, left shoulder: Secondary | ICD-10-CM | POA: Diagnosis not present

## 2019-04-04 ENCOUNTER — Other Ambulatory Visit: Payer: Self-pay | Admitting: Podiatry

## 2019-04-15 ENCOUNTER — Other Ambulatory Visit: Payer: Self-pay

## 2019-04-15 ENCOUNTER — Encounter: Payer: Self-pay | Admitting: *Deleted

## 2019-04-18 ENCOUNTER — Other Ambulatory Visit: Payer: Self-pay

## 2019-04-18 ENCOUNTER — Other Ambulatory Visit
Admission: RE | Admit: 2019-04-18 | Discharge: 2019-04-18 | Disposition: A | Discharge status: Home / Self Care | Payer: Medicare HMO | Source: Ambulatory Visit | Attending: Podiatry | Admitting: Podiatry

## 2019-04-18 DIAGNOSIS — Z20828 Contact with and (suspected) exposure to other viral communicable diseases: Secondary | ICD-10-CM | POA: Insufficient documentation

## 2019-04-18 DIAGNOSIS — Z01812 Encounter for preprocedural laboratory examination: Secondary | ICD-10-CM | POA: Diagnosis present

## 2019-04-18 LAB — SARS CORONAVIRUS 2 (TAT 6-24 HRS): SARS Coronavirus 2: NEGATIVE

## 2019-04-18 NOTE — Anesthesia Preprocedure Evaluation (Addendum)
Anesthesia Evaluation  Patient identified by MRN, date of birth, ID band Patient awake    Reviewed: Allergy & Precautions, NPO status , Patient's Chart, lab work & pertinent test results  History of Anesthesia Complications Negative for: history of anesthetic complications  Airway Mallampati: I   Neck ROM: Full    Dental  (+) Partial Lower Upper bridge:   Pulmonary sleep apnea ,    Pulmonary exam normal breath sounds clear to auscultation       Cardiovascular Exercise Tolerance: Good hypertension, + CAD (s/p MI 2006) and +CHF  Normal cardiovascular exam Rhythm:Regular Rate:Normal  Prolonged QT  ECG 04/22/19: NSR, normal ECG  Echo 08/22/18:   NORMAL LEFT VENTRICULAR FUNCTION  NORMAL LA PRESSURES WITH NORMAL DIASTOLIC FUNCTION  NORMAL RIGHT VENTRICULAR SYSTOLIC FUNCTION  VALVULAR REGURGITATION: TRIVIAL MR, TRIVIAL PR, TRIVIAL TR  NO VALVULAR STENOSIS   Neuro/Psych  Headaches, PSYCHIATRIC DISORDERS Anxiety Depression LE neuropathy    GI/Hepatic GERD  ,  Endo/Other  diabetes, Type 2, Insulin Dependent  Renal/GU Renal InsufficiencyRenal disease     Musculoskeletal   Abdominal   Peds  Hematology  (+) Blood dyscrasia, anemia ,   Anesthesia Other Findings   Reproductive/Obstetrics                            Anesthesia Physical Anesthesia Plan  ASA: III  Anesthesia Plan: General and Regional   Post-op Pain Management:  Regional for Post-op pain and GA combined w/ Regional for post-op pain   Induction: Intravenous  PONV Risk Score and Plan: 3 and Dexamethasone and Ondansetron  Airway Management Planned: LMA  Additional Equipment:   Intra-op Plan:   Post-operative Plan: Extubation in OR  Informed Consent: I have reviewed the patients History and Physical, chart, labs and discussed the procedure including the risks, benefits and alternatives for the proposed anesthesia with the  patient or authorized representative who has indicated his/her understanding and acceptance.       Plan Discussed with: CRNA  Anesthesia Plan Comments: (Plan for preoperative popliteal sciatic and adductor saphenous nerve blocks and GA with LMA.)       Anesthesia Quick Evaluation

## 2019-04-22 ENCOUNTER — Ambulatory Visit: Payer: Medicare HMO | Admitting: Anesthesiology

## 2019-04-22 ENCOUNTER — Ambulatory Visit
Admission: RE | Admit: 2019-04-22 | Discharge: 2019-04-22 | Disposition: A | Discharge status: Home / Self Care | Payer: Medicare HMO | Attending: Podiatry | Admitting: Podiatry

## 2019-04-22 ENCOUNTER — Encounter
Admission: RE | Disposition: A | Discharge status: Home / Self Care | Payer: Self-pay | Source: Home / Self Care | Attending: Podiatry

## 2019-04-22 ENCOUNTER — Other Ambulatory Visit: Payer: Self-pay

## 2019-04-22 DIAGNOSIS — F329 Major depressive disorder, single episode, unspecified: Secondary | ICD-10-CM | POA: Diagnosis not present

## 2019-04-22 DIAGNOSIS — G43909 Migraine, unspecified, not intractable, without status migrainosus: Secondary | ICD-10-CM | POA: Diagnosis not present

## 2019-04-22 DIAGNOSIS — F419 Anxiety disorder, unspecified: Secondary | ICD-10-CM | POA: Insufficient documentation

## 2019-04-22 DIAGNOSIS — E118 Type 2 diabetes mellitus with unspecified complications: Secondary | ICD-10-CM | POA: Insufficient documentation

## 2019-04-22 DIAGNOSIS — Z9884 Bariatric surgery status: Secondary | ICD-10-CM | POA: Insufficient documentation

## 2019-04-22 DIAGNOSIS — Q742 Other congenital malformations of lower limb(s), including pelvic girdle: Secondary | ICD-10-CM | POA: Insufficient documentation

## 2019-04-22 DIAGNOSIS — Z881 Allergy status to other antibiotic agents status: Secondary | ICD-10-CM | POA: Diagnosis not present

## 2019-04-22 DIAGNOSIS — Z882 Allergy status to sulfonamides status: Secondary | ICD-10-CM | POA: Insufficient documentation

## 2019-04-22 DIAGNOSIS — E785 Hyperlipidemia, unspecified: Secondary | ICD-10-CM | POA: Diagnosis not present

## 2019-04-22 DIAGNOSIS — Z91012 Allergy to eggs: Secondary | ICD-10-CM | POA: Diagnosis not present

## 2019-04-22 DIAGNOSIS — I2510 Atherosclerotic heart disease of native coronary artery without angina pectoris: Secondary | ICD-10-CM | POA: Insufficient documentation

## 2019-04-22 DIAGNOSIS — Z8249 Family history of ischemic heart disease and other diseases of the circulatory system: Secondary | ICD-10-CM | POA: Insufficient documentation

## 2019-04-22 DIAGNOSIS — Z794 Long term (current) use of insulin: Secondary | ICD-10-CM | POA: Insufficient documentation

## 2019-04-22 DIAGNOSIS — Z820 Family history of epilepsy and other diseases of the nervous system: Secondary | ICD-10-CM | POA: Insufficient documentation

## 2019-04-22 DIAGNOSIS — Z809 Family history of malignant neoplasm, unspecified: Secondary | ICD-10-CM | POA: Insufficient documentation

## 2019-04-22 DIAGNOSIS — Z91018 Allergy to other foods: Secondary | ICD-10-CM | POA: Insufficient documentation

## 2019-04-22 DIAGNOSIS — F519 Sleep disorder not due to a substance or known physiological condition, unspecified: Secondary | ICD-10-CM | POA: Diagnosis not present

## 2019-04-22 DIAGNOSIS — Z79899 Other long term (current) drug therapy: Secondary | ICD-10-CM | POA: Diagnosis not present

## 2019-04-22 DIAGNOSIS — I509 Heart failure, unspecified: Secondary | ICD-10-CM | POA: Diagnosis not present

## 2019-04-22 DIAGNOSIS — Z880 Allergy status to penicillin: Secondary | ICD-10-CM | POA: Insufficient documentation

## 2019-04-22 DIAGNOSIS — Z9049 Acquired absence of other specified parts of digestive tract: Secondary | ICD-10-CM | POA: Insufficient documentation

## 2019-04-22 DIAGNOSIS — I428 Other cardiomyopathies: Secondary | ICD-10-CM | POA: Insufficient documentation

## 2019-04-22 DIAGNOSIS — I252 Old myocardial infarction: Secondary | ICD-10-CM | POA: Insufficient documentation

## 2019-04-22 DIAGNOSIS — G4730 Sleep apnea, unspecified: Secondary | ICD-10-CM | POA: Insufficient documentation

## 2019-04-22 DIAGNOSIS — M2041 Other hammer toe(s) (acquired), right foot: Secondary | ICD-10-CM | POA: Insufficient documentation

## 2019-04-22 DIAGNOSIS — I110 Hypertensive heart disease with heart failure: Secondary | ICD-10-CM | POA: Diagnosis not present

## 2019-04-22 DIAGNOSIS — Z7982 Long term (current) use of aspirin: Secondary | ICD-10-CM | POA: Insufficient documentation

## 2019-04-22 DIAGNOSIS — M25571 Pain in right ankle and joints of right foot: Secondary | ICD-10-CM | POA: Diagnosis not present

## 2019-04-22 DIAGNOSIS — G4733 Obstructive sleep apnea (adult) (pediatric): Secondary | ICD-10-CM | POA: Insufficient documentation

## 2019-04-22 DIAGNOSIS — R9431 Abnormal electrocardiogram [ECG] [EKG]: Secondary | ICD-10-CM | POA: Insufficient documentation

## 2019-04-22 DIAGNOSIS — Z823 Family history of stroke: Secondary | ICD-10-CM | POA: Insufficient documentation

## 2019-04-22 DIAGNOSIS — Z888 Allergy status to other drugs, medicaments and biological substances status: Secondary | ICD-10-CM | POA: Insufficient documentation

## 2019-04-22 DIAGNOSIS — K219 Gastro-esophageal reflux disease without esophagitis: Secondary | ICD-10-CM | POA: Insufficient documentation

## 2019-04-22 DIAGNOSIS — D649 Anemia, unspecified: Secondary | ICD-10-CM | POA: Insufficient documentation

## 2019-04-22 DIAGNOSIS — Z833 Family history of diabetes mellitus: Secondary | ICD-10-CM | POA: Insufficient documentation

## 2019-04-22 HISTORY — PX: HAMMER TOE SURGERY: SHX385

## 2019-04-22 HISTORY — DX: Presence of spectacles and contact lenses: Z97.3

## 2019-04-22 HISTORY — DX: Presence of dental prosthetic device (complete) (partial): Z97.2

## 2019-04-22 HISTORY — DX: Dizziness and giddiness: R42

## 2019-04-22 HISTORY — PX: ACHILLES TENDON SURGERY: SHX542

## 2019-04-22 LAB — GLUCOSE, CAPILLARY
Glucose-Capillary: 118 mg/dL — ABNORMAL HIGH (ref 70–99)
Glucose-Capillary: 137 mg/dL — ABNORMAL HIGH (ref 70–99)

## 2019-04-22 SURGERY — KIDNER PROCEDURE, MODIFIED
Anesthesia: Regional | Site: Toe | Laterality: Right

## 2019-04-22 MED ORDER — ROPIVACAINE HCL 5 MG/ML IJ SOLN
INTRAMUSCULAR | Status: DC | PRN
  Administered 2019-04-22: 30 mL

## 2019-04-22 MED ORDER — ASPIRIN EC 325 MG PO TBEC: 325.0000 mg | DELAYED_RELEASE_TABLET | Freq: Every day | ORAL | 1 refills | Status: AC

## 2019-04-22 MED ORDER — CLINDAMYCIN HCL 300 MG PO CAPS: 300.0000 mg | ORAL_CAPSULE | Freq: Three times a day (TID) | ORAL | 0 refills | Status: AC

## 2019-04-22 MED ORDER — FENTANYL CITRATE (PF) 100 MCG/2ML IJ SOLN: 25.0000 ug | INTRAMUSCULAR | Status: DC | PRN

## 2019-04-22 MED ORDER — EPHEDRINE SULFATE 50 MG/ML IJ SOLN
INTRAMUSCULAR | Status: DC | PRN
  Administered 2019-04-22: 10 mg via INTRAVENOUS
  Administered 2019-04-22 (×2): 5 mg via INTRAVENOUS
  Administered 2019-04-22: 10 mg via INTRAVENOUS
  Administered 2019-04-22: 5 mg via INTRAVENOUS

## 2019-04-22 MED ORDER — PHENYLEPHRINE HCL (PRESSORS) 10 MG/ML IV SOLN
INTRAVENOUS | Status: DC | PRN
  Administered 2019-04-22: 100 ug via INTRAVENOUS
  Administered 2019-04-22 (×2): 50 ug via INTRAVENOUS
  Administered 2019-04-22 (×2): 100 ug via INTRAVENOUS

## 2019-04-22 MED ORDER — 0.9 % SODIUM CHLORIDE (POUR BTL) OPTIME
TOPICAL | Status: DC | PRN
  Administered 2019-04-22: 500 mL

## 2019-04-22 MED ORDER — OXYCODONE HCL 5 MG PO TABS: 5.0000 mg | ORAL_TABLET | Freq: Once | ORAL | Status: DC | PRN

## 2019-04-22 MED ORDER — LACTATED RINGERS IV SOLN
100.0000 mL/h | INTRAVENOUS | Status: DC
  Administered 2019-04-22: 09:00:00 via INTRAVENOUS
  Administered 2019-04-22: 100 mL/h via INTRAVENOUS

## 2019-04-22 MED ORDER — OXYCODONE HCL 5 MG/5ML PO SOLN: 5.0000 mg | Freq: Once | ORAL | Status: DC | PRN

## 2019-04-22 MED ORDER — POVIDONE-IODINE 7.5 % EX SOLN
Freq: Once | CUTANEOUS | Status: AC
  Administered 2019-04-22: 08:00:00 via TOPICAL

## 2019-04-22 MED ORDER — ONDANSETRON HCL 4 MG/2ML IJ SOLN
INTRAMUSCULAR | Status: DC | PRN
  Administered 2019-04-22: 4 mg via INTRAVENOUS

## 2019-04-22 MED ORDER — FENTANYL CITRATE (PF) 100 MCG/2ML IJ SOLN
INTRAMUSCULAR | Status: DC | PRN
  Administered 2019-04-22: 50 ug via INTRAVENOUS

## 2019-04-22 MED ORDER — ONDANSETRON HCL 4 MG/2ML IJ SOLN: 4.0000 mg | Freq: Once | INTRAMUSCULAR | Status: DC | PRN

## 2019-04-22 MED ORDER — GLYCOPYRROLATE 0.2 MG/ML IJ SOLN
INTRAMUSCULAR | Status: DC | PRN
  Administered 2019-04-22: 0.1 mg via INTRAVENOUS

## 2019-04-22 MED ORDER — OXYCODONE HCL 5 MG PO TABS: 5.0000 mg | ORAL_TABLET | Freq: Four times a day (QID) | ORAL | 0 refills | Status: AC | PRN

## 2019-04-22 MED ORDER — CLINDAMYCIN PHOSPHATE 900 MG/50ML IV SOLN
900.0000 mg | INTRAVENOUS | Status: AC
  Administered 2019-04-22: 900 mg via INTRAVENOUS

## 2019-04-22 MED ORDER — ACETAMINOPHEN 10 MG/ML IV SOLN: 1000.0000 mg | Freq: Once | INTRAVENOUS | Status: DC | PRN

## 2019-04-22 MED ORDER — LIDOCAINE HCL (CARDIAC) PF 100 MG/5ML IV SOSY
PREFILLED_SYRINGE | INTRAVENOUS | Status: DC | PRN
  Administered 2019-04-22: 20 mg via INTRATRACHEAL

## 2019-04-22 MED ORDER — DEXAMETHASONE SODIUM PHOSPHATE 4 MG/ML IJ SOLN
INTRAMUSCULAR | Status: DC | PRN
  Administered 2019-04-22: 4 mg via INTRAVENOUS

## 2019-04-22 MED ORDER — MIDAZOLAM HCL 2 MG/2ML IJ SOLN
INTRAMUSCULAR | Status: DC | PRN
  Administered 2019-04-22 (×2): 2 mg via INTRAVENOUS

## 2019-04-22 MED ORDER — PROPOFOL 10 MG/ML IV BOLUS
INTRAVENOUS | Status: DC | PRN
  Administered 2019-04-22: 140 mg via INTRAVENOUS

## 2019-04-22 MED ORDER — LACTATED RINGERS IV SOLN: INTRAVENOUS | Status: DC

## 2019-04-22 SURGICAL SUPPLY — 71 items
ANCHOR SUT 1.45 SZ 1 SHORT (Anchor) ×1 IMPLANT
BANDAGE ELASTIC 4 LF NS (GAUZE/BANDAGES/DRESSINGS) ×6 IMPLANT
BENZOIN TINCTURE PRP APPL 2/3 (GAUZE/BANDAGES/DRESSINGS) ×3 IMPLANT
BLADE MED AGGRESSIVE (BLADE) IMPLANT
BLADE MINI RND TIP GREEN BEAV (BLADE) IMPLANT
BLADE OSC/SAGITTAL MD 5.5X18 (BLADE) ×1 IMPLANT
BLADE OSC/SAGITTAL MD 9X18.5 (BLADE) ×1 IMPLANT
BLADE SURG 15 STRL LF DISP TIS (BLADE) IMPLANT
BLADE SURG 15 STRL SS (BLADE) ×3
BLADE SURG MINI STRL (BLADE) IMPLANT
BNDG ELASTIC 4X5.8 VLCR STR LF (GAUZE/BANDAGES/DRESSINGS) ×1 IMPLANT
BNDG ESMARK 4X12 TAN STRL LF (GAUZE/BANDAGES/DRESSINGS) ×3 IMPLANT
BNDG GAUZE 4.5X4.1 6PLY STRL (MISCELLANEOUS) ×3 IMPLANT
BNDG STRETCH 4X75 STRL LF (GAUZE/BANDAGES/DRESSINGS) ×3 IMPLANT
CANISTER SUCT 1200ML W/VALVE (MISCELLANEOUS) ×3 IMPLANT
COVER LIGHT HANDLE UNIVERSAL (MISCELLANEOUS) ×6 IMPLANT
COVER PIN YLW 0.028-062 (MISCELLANEOUS) ×3 IMPLANT
CUFF TOURN SGL QUICK 18X4 (TOURNIQUET CUFF) ×1 IMPLANT
CUFF TOURN SGL QUICK 30 (TOURNIQUET CUFF) ×1
CUFF TRNQT CYL 30X4X21-28X (TOURNIQUET CUFF) ×2 IMPLANT
DRAPE FLUOR MINI C-ARM 54X84 (DRAPES) ×3 IMPLANT
DRILL WIRE PASS (DRILL) IMPLANT
DURAPREP 26ML APPLICATOR (WOUND CARE) ×3 IMPLANT
ELECT REM PT RETURN 9FT ADLT (ELECTROSURGICAL) ×3
ELECTRODE REM PT RTRN 9FT ADLT (ELECTROSURGICAL) ×2 IMPLANT
GAUZE PETRO XEROFOAM 1X8 (MISCELLANEOUS) ×3 IMPLANT
GAUZE SPONGE 4X4 12PLY STRL (GAUZE/BANDAGES/DRESSINGS) ×3 IMPLANT
GAUZE XEROFORM 1X8 LF (GAUZE/BANDAGES/DRESSINGS) ×1 IMPLANT
GLOVE BIO SURGEON STRL SZ8 (GLOVE) ×3 IMPLANT
GOWN STRL REUS W/ TWL LRG LVL3 (GOWN DISPOSABLE) ×2 IMPLANT
GOWN STRL REUS W/ TWL XL LVL3 (GOWN DISPOSABLE) ×2 IMPLANT
GOWN STRL REUS W/TWL LRG LVL3 (GOWN DISPOSABLE)
GOWN STRL REUS W/TWL XL LVL3 (GOWN DISPOSABLE) ×2
K-WIRE DBL END TROCAR 6X.045 (WIRE) ×12
K-WIRE DBL END TROCAR 6X.062 (WIRE)
KIT TURNOVER KIT A (KITS) ×3 IMPLANT
KWIRE DBL END TROCAR 6X.045 ×2 IMPLANT
KWIRE DBL END TROCAR 6X.045 (WIRE) IMPLANT
KWIRE DBL END TROCAR 6X.062 (WIRE) IMPLANT
NDL HYPO 18GX1.5 BLUNT FILL (NEEDLE) IMPLANT
NDL HYPO 25GX1X1/2 BEV (NEEDLE) ×6 IMPLANT
NEEDLE HYPO 18GX1.5 BLUNT FILL (NEEDLE) IMPLANT
NEEDLE HYPO 25GX1X1/2 BEV (NEEDLE) ×6 IMPLANT
NS IRRIG 500ML POUR BTL (IV SOLUTION) ×3 IMPLANT
PACK EXTREMITY ARMC (MISCELLANEOUS) ×3 IMPLANT
PADDING CAST BLEND 4X4 NS (MISCELLANEOUS) ×9 IMPLANT
PENCIL SMOKE EVACUATOR (MISCELLANEOUS) ×3 IMPLANT
RASP SM TEAR CROSS CUT (RASP) ×4 IMPLANT
SPLINT CAST 1 STEP 4X30 (MISCELLANEOUS) ×3 IMPLANT
SPLINT FAST PLASTER 5X30 (CAST SUPPLIES) ×1
SPLINT PLASTER CAST FAST 5X30 (CAST SUPPLIES) ×2 IMPLANT
STOCKINETTE STRL 6IN 960660 (GAUZE/BANDAGES/DRESSINGS) ×3 IMPLANT
STRAP BODY AND KNEE 60X3 (MISCELLANEOUS) ×3 IMPLANT
STRIP CLOSURE SKIN 1/4X4 (GAUZE/BANDAGES/DRESSINGS) ×3 IMPLANT
SUT ETHILON 4-0 (SUTURE) ×1
SUT ETHILON 4-0 FS2 18XMFL BLK (SUTURE) ×2
SUT ETHILON 5-0 FS-2 18 BLK (SUTURE) IMPLANT
SUT MNCRL 4-0 (SUTURE) ×1
SUT MNCRL 4-0 27XMFL (SUTURE) ×2
SUT VIC AB 1 CT1 36 (SUTURE) IMPLANT
SUT VIC AB 2-0 CT1 27 (SUTURE)
SUT VIC AB 2-0 CT1 TAPERPNT 27 (SUTURE) IMPLANT
SUT VIC AB 2-0 SH 27 (SUTURE) ×1
SUT VIC AB 2-0 SH 27XBRD (SUTURE) IMPLANT
SUT VIC AB 3-0 SH 27 (SUTURE) ×1
SUT VIC AB 3-0 SH 27X BRD (SUTURE) ×2 IMPLANT
SUT VIC AB 4-0 FS2 27 (SUTURE) ×3 IMPLANT
SUT VICRYL AB 3-0 FS1 BRD 27IN (SUTURE) IMPLANT
SUTURE ETHLN 4-0 FS2 18XMF BLK (SUTURE) IMPLANT
SUTURE MNCRL 4-0 27XMF (SUTURE) IMPLANT
SYR 10ML LL (SYRINGE) ×6 IMPLANT

## 2019-04-22 NOTE — Progress Notes (Signed)
Assisted Darrin Nipper, ANMD with right, ultrasound guided, popliteal/saphenous block. Side rails up, monitors on throughout procedure. See vital signs in flow sheet. Tolerated Procedure well.

## 2019-04-22 NOTE — Discharge Instructions (Signed)
[place] DR. Luke   1. Take your medication as prescribed.  Pain medication should be taken only as needed.  Aspirin 325mg  has been prescribed to help prevent blood clot formation, if you have an issue taking this medication, please let us know.  We can switch you to a different DVT prophylactic medication if needed.  If you have any issues receiving the medications ordered, please contact our office to resolve this problem.  2. Keep the dressing clean, dry and intact.  3. Keep your foot elevated above the heart level for the first 48 hours.  4. Walking to the bathroom and brief periods of walking are acceptable, unless we have instructed you to be non-weight bearing.  5. Always wear your post-op shoe when walking.  Always use your crutches, wheelchair, or knee scooter if you are to be non-weight bearing.  6. Do not take a shower. Baths are permissible as long as the foot is kept out of the water.   7. Every hour you are awake: This helps prevent blood clot formations (reduces risk for DVT) - Bend your knee 15 times. - Massage calf 15 times  8. Call [facility] [phone]) if any of the following problems occur: - You develop a temperature or fever. - The bandage becomes saturated with blood. - Medication does not stop your pain. - Injury of the foot occurs. - Any symptoms of infection including redness, odor, or red streaks running from wound.   General Anesthesia, Adult, Care After This sheet gives you information about how to care for yourself after your procedure. Your health care provider may also give you more specific instructions. If you have problems or questions, contact your health care provider. What can I expect after the procedure? After the procedure, the following side effects are common:  Pain or discomfort at the IV  site.  Nausea.  Vomiting.  Sore throat.  Trouble concentrating.  Feeling cold or chills.  Weak or tired.  Sleepiness and fatigue.  Soreness and body aches. These side effects can affect parts of the body that were not involved in surgery. Follow these instructions at home:  For at least 24 hours after the procedure:  Have a responsible adult stay with you. It is important to have someone help care for you until you are awake and alert.  Rest as needed.  Do not: ? Participate in activities in which you could fall or become injured. ? Drive. ? Use heavy machinery. ? Drink alcohol. ? Take sleeping pills or medicines that cause drowsiness. ? Make important decisions or sign legal documents. ? Take care of children on your own. Eating and drinking  Follow any instructions from your health care provider about eating or drinking restrictions.  When you feel hungry, start by eating small amounts of foods that are soft and easy to digest (bland), such as toast. Gradually return to your regular diet.  Drink enough fluid to keep your urine pale yellow.  If you vomit, rehydrate by drinking water, juice, or clear broth. General instructions  If you have sleep apnea, surgery and certain medicines can increase your risk for breathing problems. Follow instructions from your health care provider about wearing your sleep device: ? Anytime you are sleeping, including during daytime naps. ? While taking prescription pain medicines, sleeping medicines, or medicines that make you drowsy.  Return to your normal activities as told  by your health care provider. Ask your health care provider what activities are safe for you.  Take over-the-counter and prescription medicines only as told by your health care provider.  If you smoke, do not smoke without supervision.  Keep all follow-up visits as told by your health care provider. This is important. Contact a health care provider if:  You  have nausea or vomiting that does not get better with medicine.  You cannot eat or drink without vomiting.  You have pain that does not get better with medicine.  You are unable to pass urine.  You develop a skin rash.  You have a fever.  You have redness around your IV site that gets worse. Get help right away if:  You have difficulty breathing.  You have chest pain.  You have blood in your urine or stool, or you vomit blood. Summary  After the procedure, it is common to have a sore throat or nausea. It is also common to feel tired.  Have a responsible adult stay with you for the first 24 hours after general anesthesia. It is important to have someone help care for you until you are awake and alert.  When you feel hungry, start by eating small amounts of foods that are soft and easy to digest (bland), such as toast. Gradually return to your regular diet.  Drink enough fluid to keep your urine pale yellow.  Return to your normal activities as told by your health care provider. Ask your health care provider what activities are safe for you. This information is not intended to replace advice given to you by your health care provider. Make sure you discuss any questions you have with your health care provider. Document Released: 11/06/2000 Document Revised: 08/03/2017 Document Reviewed: 03/16/2017 Elsevier Patient Education  2020 [employer].

## 2019-04-22 NOTE — Op Note (Signed)
PODIATRY / FOOT AND ANKLE SURGERY OPERATIVE REPORT    SURGEON: Caroline More, DPM  PRE-OPERATIVE DIAGNOSIS:  1.  Accessory navicular right foot 2.  Hammertoe contracture digits 2, 3, 4, 5 right foot 3.  Right foot and ankle pain  POST-OPERATIVE DIAGNOSIS: Same  PROCEDURE(S): 1. Right foot accessory navicular resection with partial detachment and reattachment of posterior tibial tendon (Kidner procedure) 2. Right foot PIPJ arthrodesis digits 2 and 4 with K wire fixation 3. Right foot third toe flexor tenotomy 4. Right foot fifth toe PIPJ arthroplasty with derotational incision 5. Application of posterior splint right  HEMOSTASIS: Right ankle tourniquet  ANESTHESIA: MAC, popliteal/saphenous nerve block  ESTIMATED BLOOD LOSS: 5 cc  FINDING(S): 1.  Large accessory navicular with attachment to the navicular body right 2.  Hammertoes 2 through 5 right foot  PATHOLOGY/SPECIMEN(S): None   INDICATIONS:   Marissa [name] is a 43 y.o. female who presents with pain to the right medial midfoot and hammertoe contractures 2 through 5.  X-rays were taken in office showing contractures of digits 2 through 5 and large accessory navicular in the area of patient's maximal pain.  Discussed all treatment options with the patient both conservative and surgical attempts at correction and patient has exhausted all conservative therapies and would like to proceed with surgery at this time.  Surgery planned for Kidner procedure and hammertoe correction 2, 3, 4, 5.  DESCRIPTION: The patient received a popliteal/saphenous nerve block in the preoperative area after obtaining full informed written consent, the patient was brought back to the operating room and placed supine upon the operating table.  The patient received IV antibiotics prior to induction.  After obtaining adequate anesthesia, the patient was prepped and draped in the standard fashion.  An Esmarch bandage was used to exsanguinate the right lower  extremity and the pneumatic ankle tourniquet was inflated.  Attention was then directed to the right medial foot at the area of the large accessory navicular bone.  An incision was made over this area and over the course the posterior tibial tendon.  The incision was deepened to the subcutaneous tissues and all vital neurovascular structures were retracted as necessary.  Venous structures were cauterized along the way to control hemostasis.  The posterior heel tendon sheath was then identified along with the large accessory navicular bone and the periosteum overlying this area.  An incision was then made over the dorsal medial periosteum of the accessory navicular area.  The periosteum along with a portion of the posterior tibial tendon was then reflected off of the dorsal medial and plantar aspects of the accessory navicular bone and a portion of the navicular.  At this time an osteotome and mallet was then used to find the interval between the navicular and the accessory navicular.  None was found at this time as the accessory navicular appeared to be completely fused to the navicular body.  Under fluoroscopic guidance a sagittal bone saw was then used to resect the area of the accessory navicular.  The accessory navicular was then passed off the operative site.  A bone rasp as well as rondure was then used to smooth off any rough surfaces at the operative site.  At this time the surgical site was flushed with copious amounts normal sterile saline and a 1.45 mm Biomet Zimmer juggernaut suture anchor was then placed into the navicular at the plantar medial aspect under fluoroscopic guidance in the appropriate position.  It was directed from plantar medial to the  dorsal lateral.  The suture anchor was then placed and excellent seating was noted.  The suture material connected to the suture anchor was then placed through the partially detached portion of the posterior tibial tendon and periosteum and then tied back  into the navicular bone.  Reinforcement of the repair was then performed with 2-0 Vicryl reapproximating the periosteum and posterior tibial tendon back to its original position on the navicular.  The posterior tibial tendon sheath was then reapproximated well coapted with 4-0 Monocryl, the subcutaneous tissue was also reapproximated well coapted with 4-0 Monocryl and the skin was then reapproximated well coapted with 4-0 nylon and horizontal mattress type stitching.  Attention was then directed to digits 2, 3, 4, 5.  Starting with the second digit over the PIPJ linear longitudinal incision was made and an extensor tenotomy and capsulotomy was then performed of the PIPJ and the collateral ligaments were released.  The extensor tendon was reflected proximally thereby exposing the PIPJ at the operative site.  A sagittal bone saw was then used to resect the head of the proximal phalanx at the operative site it was then passed off in the operative site.  A rondure and curette were then used to remove the articular cartilage off the base of the middle phalanx.  The surgical site was then flushed with copious amounts normal sterile saline and a 0.045 wire was then placed through the middle and distal phalanx and then directed through the proximal phalanx holding the toe in a rectus position.  Fluoroscopic guidance was used to verify correct pin placement which appeared to be excellent.  The same procedure was then performed on the fourth digit of the right foot.  Attention was directed to the third toe laterally at the level of the PIPJ.  Where a percutaneous incision was made with a 15 blade and the flexor tendon was then released completing a flexor tenotomy and the toe appeared to sit in a rectus position at this time.  Attention was then directed to the right fifth toe at the level of PIPJ where a derotational type of incision was made to derotate the toe out of adductovarus, it was an elliptical type of  incision to remove an area of hyperkeratotic tissue that had developed.  The incision was made in such a way that it was distal medial to proximal lateral.  The incision was deepened to the subcutaneous tissues were all vital neurovascular structures were retracted as necessary.  At this time an extensor tenotomy and capsulotomy was performed followed by release of the collateral ligaments and the PIPJ was identified at this time. The head of the proximal phalanx was then resected and passed off the operative site with a sagittal bone saw.  All surgical sites were then flushed with copious amounts normal sterile saline.  The extensor tendons on digits 2 4 and 5 were reapproximated well coapted with 2-0 Vicryl, the skin at all incision sites was then reapproximated well coapted with 4-0 nylon in simple and horizontal mattress type stitching.  The pneumatic ankle tourniquet was deflated and prompt hyperemic response was noted all digits of the right foot.  The pins placed through the second and fourth toes were then bent and cut and pin caps were placed.  Postoperative dressing was then applied consisting of Xeroform to the incision sites followed by 4 x 4 gauze, Kling, Kerlix, soft roll, posterior splint, Ace wrap.  The patient tolerated the procedure and anesthesia well was transferred to the  recovery room with vital signs stable and vascular status intact all toes the right foot.  Following.  Postoperative monitoring the patient be discharged home the following written and oral postop instructions: Keep surgical dressings clean, dry, and intact, ice and elevate right lower extremity when at rest, take postop pain, antibiotic, antinausea, aspirin medication as prescribed.  Discussed with pain management doctor additional narcotic medication for pain relief and approved.  Remain nonweightbearing to the right lower extremity at all times.  Return to clinic for follow-up in 1 week of postoperative surgical  date  COMPLICATIONS: None  CONDITION: Good, stable  Caroline More

## 2019-04-22 NOTE — Anesthesia Procedure Notes (Signed)
Anesthesia Regional Block: Popliteal block   Pre-Anesthetic Checklist: ,, timeout performed, Correct Patient, Correct Site, Correct Laterality, Correct Procedure, Correct Position, site marked, Risks and benefits discussed,  Surgical consent,  Pre-op evaluation,  At surgeon's request and post-op pain management  Laterality: Right  Prep: chloraprep       Needles:  Injection technique: Single-shot  Needle Type: Echogenic Needle     Needle Length: 9cm  Needle Gauge: 21     Additional Needles:   Procedures:,,,, ultrasound used (permanent image in chart),,,,  Narrative:  Start time: 04/22/2019 7:17 AM End time: 04/22/2019 7:27 AM Injection made incrementally with aspirations every 5 mL.  Performed by: Personally  Anesthesiologist: Darrin Nipper, MD  Additional Notes: Functioning IV was confirmed and monitors applied. Ultrasound guidance: relevant anatomy identified, needle position confirmed, local anesthetic spread visualized around nerve(s), vascular puncture avoided.  Image printed for medical record.  Negative aspiration and no paresthesias; incremental administration of local anesthetic for total 47ml ropivacaine 0.5%. The patient tolerated the procedure well. Vital signs recorded in RN notes.

## 2019-04-22 NOTE — Anesthesia Procedure Notes (Signed)
Procedure Name: LMA Insertion Date/Time: 04/22/2019 7:39 AM Performed by: Cameron Ali, CRNA Pre-anesthesia Checklist: Patient identified, Emergency Drugs available, Suction available, Timeout performed and Patient being monitored Patient Re-evaluated:Patient Re-evaluated prior to induction Oxygen Delivery Method: Circle system utilized Preoxygenation: Pre-oxygenation with 100% oxygen Induction Type: IV induction LMA: LMA inserted LMA Size: 4.0 Number of attempts: 1 Placement Confirmation: positive ETCO2 and breath sounds checked- equal and bilateral Tube secured with: Tape Dental Injury: Teeth and Oropharynx as per pre-operative assessment

## 2019-04-22 NOTE — Anesthesia Procedure Notes (Signed)
Anesthesia Regional Block: Adductor canal block   Pre-Anesthetic Checklist: ,, timeout performed, Correct Patient, Correct Site, Correct Laterality, Correct Procedure, Correct Position, site marked, Risks and benefits discussed,  Surgical consent,  Pre-op evaluation,  At surgeon's request and post-op pain management  Laterality: Right  Prep: chloraprep       Needles:  Injection technique: Single-shot  Needle Type: Echogenic Needle     Needle Length: 9cm  Needle Gauge: 21     Additional Needles:   Procedures:,,,, ultrasound used (permanent image in chart),,,,  Narrative:  Start time: 04/22/2019 7:17 AM End time: 04/22/2019 7:27 AM Injection made incrementally with aspirations every 5 mL.  Performed by: Personally  Anesthesiologist: Darrin Nipper, MD  Additional Notes: Functioning IV was confirmed and monitors applied. Ultrasound guidance: relevant anatomy identified, needle position confirmed, local anesthetic spread visualized around nerve(s), vascular puncture avoided.  Image printed for medical record.  Negative aspiration and no paresthesias; incremental administration of local anesthetic for total 27ml ropivacaine 0.5%. The patient tolerated the procedure well. Vital signs recorded in RN notes.

## 2019-04-22 NOTE — Anesthesia Postprocedure Evaluation (Signed)
Anesthesia Post Note  Patient: Marissa [name]  Procedure(s) Performed: Vincente Liberty PROCEDURE WITH SUTURE ANCHOR (Right Foot) HAMMER TOE CORRECTION X 4 (Right Toe)  Patient location during evaluation: PACU Anesthesia Type: Regional Level of consciousness: awake and alert, oriented and patient cooperative Pain management: pain level controlled Vital Signs Assessment: post-procedure vital signs reviewed and stable Respiratory status: spontaneous breathing, nonlabored ventilation and respiratory function stable Cardiovascular status: blood pressure returned to baseline and stable Postop Assessment: adequate PO intake Anesthetic complications: no    Darrin Nipper

## 2019-04-22 NOTE — H&P (Signed)
HISTORY AND PHYSICAL INTERVAL NOTE:  04/22/2019  7:12 AM  Marissa [name]  has presented today for surgery, with the diagnosis of: M79.671 PAIN RIGHT FOOT Q74.2 ACCESSORY NAVICULAR BONE RIGHT FOOT M76.821 POSTERIOR TIBIAL TENDONITIS RIGHT M21.41, M21.42 PES PLANUS BOTH FEET M21.6X1, M21.6X2 EQUINUS DEFORMITY BOTH FEET M20.41 HAMMERTOE RIGHT FOOT M79.671 RIGHT FOOT PAIN.  The various methods of treatment have been discussed with the patient.  No guarantees were given.  After consideration of risks, benefits and other options for treatment, the patient has consented to surgery.  I have reviewed the patients' chart and labs.   Procedure: right foot kidner procedure with reattachment of the posterior tibial tendon, hammer toe correction 2-5 right foot  A history and physical examination was performed in my office.  The patient was reexamined.  There have been no changes to this history and physical examination.  Caroline More

## 2019-04-22 NOTE — Transfer of Care (Signed)
Immediate Anesthesia Transfer of Care Note  Patient: Marissa [name]  Procedure(s) Performed: Vincente Liberty PROCEDURE WITH SUTURE ANCHOR (Right Foot) HAMMER TOE CORRECTION X 4 (Right Toe)  Patient Location: PACU  Anesthesia Type: General, Regional  Level of Consciousness: awake, alert  and patient cooperative  Airway and Oxygen Therapy: Patient Spontanous Breathing and Patient connected to supplemental oxygen  Post-op Assessment: Post-op Vital signs reviewed, Patient's Cardiovascular Status Stable, Respiratory Function Stable, Patent Airway and No signs of Nausea or vomiting  Post-op Vital Signs: Reviewed and stable  Complications: No apparent anesthesia complications

## 2019-04-23 ENCOUNTER — Encounter: Payer: Self-pay | Admitting: Podiatry

## 2019-06-22 ENCOUNTER — Telehealth: Payer: Self-pay

## 2019-06-22 NOTE — Telephone Encounter (Signed)
Pt asking for covid test results. No results noted. Pt stated she was tested 06/20/19 at the tents at [facility].

## 2019-06-23 NOTE — Telephone Encounter (Signed)
Contacted [facility] in an attempt to locate results. Awaiting follow up from [place].

## 2019-06-25 NOTE — Telephone Encounter (Signed)
Received follow up for [facility] unable to find test results for the patient. Pt was not listed on the log for the [facility] site on that date either. Almyra Free, from [place] is also checking the [facility] site log and will follow up once information received from that site.  Patient called and states she spoke with someone on Monday and received her test results. No other concerns voiced at this time.

## 2019-09-23 ENCOUNTER — Other Ambulatory Visit: Payer: Self-pay

## 2019-09-23 ENCOUNTER — Emergency Department
Admission: EM | Admit: 2019-09-23 | Discharge: 2019-09-24 | Disposition: A | Discharge status: Home / Self Care | Payer: Medicare HMO | Attending: Emergency Medicine | Admitting: Emergency Medicine

## 2019-09-23 ENCOUNTER — Encounter: Payer: Self-pay | Admitting: Emergency Medicine

## 2019-09-23 DIAGNOSIS — I2510 Atherosclerotic heart disease of native coronary artery without angina pectoris: Secondary | ICD-10-CM | POA: Diagnosis not present

## 2019-09-23 DIAGNOSIS — I110 Hypertensive heart disease with heart failure: Secondary | ICD-10-CM | POA: Insufficient documentation

## 2019-09-23 DIAGNOSIS — E1140 Type 2 diabetes mellitus with diabetic neuropathy, unspecified: Secondary | ICD-10-CM | POA: Insufficient documentation

## 2019-09-23 DIAGNOSIS — D509 Iron deficiency anemia, unspecified: Secondary | ICD-10-CM

## 2019-09-23 DIAGNOSIS — Z794 Long term (current) use of insulin: Secondary | ICD-10-CM | POA: Insufficient documentation

## 2019-09-23 DIAGNOSIS — E119 Type 2 diabetes mellitus without complications: Secondary | ICD-10-CM | POA: Diagnosis not present

## 2019-09-23 DIAGNOSIS — Z79899 Other long term (current) drug therapy: Secondary | ICD-10-CM | POA: Insufficient documentation

## 2019-09-23 DIAGNOSIS — I509 Heart failure, unspecified: Secondary | ICD-10-CM | POA: Insufficient documentation

## 2019-09-23 DIAGNOSIS — R42 Dizziness and giddiness: Secondary | ICD-10-CM | POA: Diagnosis present

## 2019-09-23 LAB — GLUCOSE, CAPILLARY: Glucose-Capillary: 157 mg/dL — ABNORMAL HIGH (ref 70–99)

## 2019-09-23 LAB — BASIC METABOLIC PANEL
Anion gap: 11 (ref 5–15)
BUN: 13 mg/dL (ref 6–20)
CO2: 23 mmol/L (ref 22–32)
Calcium: 9.2 mg/dL (ref 8.9–10.3)
Chloride: 107 mmol/L (ref 98–111)
Creatinine, Ser: 0.98 mg/dL (ref 0.44–1.00)
GFR calc Af Amer: >60 mL/min (ref >60)
GFR calc non Af Amer: >60 mL/min (ref >60)
Glucose, Bld: 189 mg/dL — ABNORMAL HIGH (ref 70–99)
Potassium: 3.8 mmol/L (ref 3.5–5.1)
Sodium: 141 mmol/L (ref 135–145)

## 2019-09-23 LAB — CBC
HCT: 30.3 % — ABNORMAL LOW (ref 36.0–46.0)
Hemoglobin: 9.4 g/dL — ABNORMAL LOW (ref 12.0–15.0)
MCH: 24.4 pg — ABNORMAL LOW (ref 26.0–34.0)
MCHC: 31 g/dL (ref 30.0–36.0)
MCV: 78.7 fL — ABNORMAL LOW (ref 80.0–100.0)
Platelets: 202 10*3/uL (ref 150–400)
RBC: 3.85 MIL/uL — ABNORMAL LOW (ref 3.87–5.11)
RDW: 15.1 % (ref 11.5–15.5)
WBC: 5.5 10*3/uL (ref 4.0–10.5)
nRBC: 0 % (ref 0.0–0.2)

## 2019-09-23 LAB — TROPONIN I (HIGH SENSITIVITY): Troponin I (High Sensitivity): 3 ng/L (ref <18)

## 2019-09-23 LAB — TYPE AND SCREEN
ABO/RH(D): B POS
Antibody Screen: NEGATIVE

## 2019-09-23 MED ORDER — SODIUM CHLORIDE 0.9% FLUSH: 3.0000 mL | Freq: Once | INTRAVENOUS | Status: DC

## 2019-09-23 NOTE — ED Triage Notes (Signed)
Pt to ED from home c/o dizziness that has been going on for approx 4 weeks but became worse today.  Followed up with [facility] who told patient she's anemic and to be seen in ED.  Denies new pain, n/v/d, or SOB.  States fatigue.  Pt presents A&Ox4, pale color chest rise even and unlabored.

## 2019-09-23 NOTE — ED Notes (Signed)
Pt reports she has been dizzy for past 4 weeks.  Denies chest pain or sob.  Sx worse today.  Pt states [facility] told pt she was anemic and needed an iron transfusion.  Denies n/v/d  Hx anemia.  Pt alert speech clear.sinus tach on monitor.

## 2019-09-24 ENCOUNTER — Emergency Department: Payer: Medicare HMO

## 2019-09-24 DIAGNOSIS — D509 Iron deficiency anemia, unspecified: Secondary | ICD-10-CM | POA: Diagnosis not present

## 2019-09-24 MED ORDER — SODIUM CHLORIDE 0.9 % IV SOLN: 750.0000 mg | Freq: Once | INTRAVENOUS | Status: DC

## 2019-09-24 MED ORDER — SODIUM CHLORIDE 0.9 % IV SOLN
510.0000 mg | Freq: Once | INTRAVENOUS | Status: AC
  Administered 2019-09-24: 510 mg via INTRAVENOUS
  Filled 2019-09-24: qty 17

## 2019-09-24 NOTE — ED Provider Notes (Signed)
[facility] Emergency Department Provider Note  ____________________________________________   First MD Initiated Contact with Patient 09/23/19 2325     (approximate)  I have reviewed the triage vital signs and the nursing notes.   HISTORY  Chief Complaint Dizziness    HPI Marissa [name] is a 44 y.o. female  presents to the emergency department referred by primary care provider for iron infusion patient states that she was seen by Dr. Netty Starring at her noted clinic and advised that she had iron deficiency anemia for which she should present to the emergency department for an iron infusion.  Patient admits to approximate 4-week period of generalized fatigue and dizziness and feeling cold.  Patient denies any abdominal pain no bright red blood or dark stools.  Patient does admit to dyspnea however no chest pain.       Past Medical History:  Diagnosis Date  . Allergy   . Anemia   . Anxiety   . Arthritis   . Back pain, chronic   . Cardiomyopathy, dilated, nonischemic ([place])   . CHF (congestive heart failure) ([place])   . Cholelithiasis   . Coronary artery disease   . Depression   . Diabetes mellitus without complication (HCC)    fasting cbg 50-140s  . Dysrhythmia    tachycardia  . GERD (gastroesophageal reflux disease)    otc meds  . Headache(784.0)   . Hypercholesteremia   . Hyperlipemia   . Hypertension   . Insulin pump in place    pt had insulin pump but it is now removed (02-18-16)  . MI, old    2006  . Migraines    once/month maybe - can last up to two weeks  . Myocardial infarction [facility]) 2006   "due to medication"; no evidence of ischemia or infarction by nuclear stress test '11  . Neck pain, chronic   . Neuropathy    legs  . Pneumonia 2015   [facility]  . Prolonged QT interval syndrome   . Renal insufficiency   . Restless leg syndrome, controlled   . Sepsis ([place]) 2016   secondary to surgery  . Sleep apnea    does not use cpap since  losing alot of weight  . Vertigo    nop episodes 2020  . Vision loss    due to diabetes  . Wears contact lenses   . Wears dentures    partial lower    Patient Active Problem List   Diagnosis Date Noted  . Abdominal wall abscess 04/06/2018  . Chronic superficial gastritis without bleeding 04/02/2018  . Abscess in epidural space of lumbar spine 04/02/2018  . Acute abdomen 03/16/2018  . Anastomotic leak of intestine   . Peritonitis ([place])   . Pelvic pain 03/08/2018  . Impingement syndrome of left shoulder 02/07/2018  . Type 2 diabetes mellitus without complication, without long-term current use of insulin ([place]) 11/28/2017  . Other fatigue 11/02/2017  . Chronic tension-type headache, intractable 09/19/2017  . Primary osteoarthritis of left knee 09/10/2017  . Polyarthralgia 09/10/2017  . Chronic left shoulder pain 09/10/2017  . Restless leg syndrome 05/23/2017  . MI, old 05/23/2017  . Congestive heart failure ([place]) 05/23/2017  . Chronic painful diabetic neuropathy ([place]) 02/06/2017  . Diabetes mellitus ([place]) 11/21/2016  . Obstructive sleep apnea syndrome 11/21/2016  . Dyslipidemia 11/21/2016  . Paresthesia 09/06/2016  . Primary insomnia 03/14/2016  . Complete tear of right rotator cuff 02/01/2016  . Cardiomyopathy ([place]) 04/23/2015  . Long term  current use of antibiotics 04/23/2015  . Biliary colic 51/70/0174  . Calculus of gallbladder with acute cholecystitis 04/07/2015  . Bilateral occipital neuralgia 01/21/2015  . Dizziness 01/04/2015  . Headache 01/04/2015  . DDD (degenerative disc disease), cervical 12/18/2014  . DDD (degenerative disc disease), thoracic 12/18/2014  . DDD (degenerative disc disease), lumbosacral 12/18/2014  . Neuropathy due to secondary diabetes ([place]) 12/18/2014  . Migraine 12/18/2014  . Hypercholesterolemia without hypertriglyceridemia 08/28/2014  . HPV test positive 07/17/2014  . Class 2 obesity 04/15/2014  . CAD in native artery 04/15/2014  .  Breathlessness on exertion 03/24/2014  . Wound infection complicating hardware ([place]) 02/25/2014  . Nausea alone 02/01/2014  . Abdominal pain, unspecified site 02/01/2014  . Severe sepsis(995.92) 01/30/2014  . History of lumbar laminectomy 01/30/2014  . Acute renal failure ([place]) 01/30/2014  . Anemia 01/30/2014  . HTN (hypertension) 01/30/2014  . DM (diabetes mellitus), type 2 with renal complications ([place]) 94/49/6759  . Wound infection 01/25/2014  . Acute respiratory failure with hypoxia ([place]) 01/25/2014  . Type 2 diabetes mellitus treated with insulin ([place]) 01/13/2014  . Essential (primary) hypertension 01/13/2014  . Difficulty speaking 01/13/2014  . Clinical depression 01/13/2014  . Insulin dependent type 2 diabetes mellitus ([place]) 01/13/2014  . Back pain 11/01/2013  . HNP (herniated nucleus pulposus), lumbar 10/29/2013    Past Surgical History:  Procedure Laterality Date  . ACHILLES TENDON SURGERY Right 04/22/2019   Procedure: [facility] PROCEDURE WITH SUTURE ANCHOR;  Surgeon: Caroline More, DPM;  Location: [place];  Service: Podiatry;  Laterality: Right;  . APPENDECTOMY N/A 03/08/2018   Procedure: APPENDECTOMY;  Surgeon: Benjaman Kindler, MD;  Location: [facility] ORS;  Service: Gynecology;  Laterality: N/A;  By Dr. Windell Moment  . BACK SURGERY  2011   Lumbar   . BOWEL RESECTION N/A 03/08/2018   Procedure: SMALL BOWEL RESECTION;  Surgeon: Benjaman Kindler, MD;  Location: [facility] OR[facility];  Service: Gynecology;  Laterality: N/A;  . CARDIAC CATHETERIZATION  07/18/2004   50-60% mid LAD, minor luminal irregularities RCA, normal LM and CX, EF 50-55% [facility])  . carpel tunnel Bilateral   . CERVICAL FUSION  2006  . CHOLECYSTECTOMY N/A 04/08/2015   Procedure: LAPAROSCOPIC CHOLECYSTECTOMY WITH INTRAOPERATIVE CHOLANGIOGRAM;  Surgeon: Florene Glen, MD;  Location: [facility] ORS;  Service: General;  Laterality: N/A;  . COLON RESECTION N/A 03/16/2018   Procedure: COLON RESECTION;   Surgeon: Florene Glen, MD;  Location: [facility];  Service: General;  Laterality: N/A;  . HAMMER TOE SURGERY Right 04/22/2019   Procedure: HAMMER TOE CORRECTION X 4;  Surgeon: Caroline More, DPM;  Location: [place];  Service: Podiatry;  Laterality: Right;  Diabetic - insulin and oral meds  . LAPAROSCOPIC GASTRECTOMY  05/31/2017   [facility], Dr. Darnell Level  . LUMBAR LAMINECTOMY/DECOMPRESSION MICRODISCECTOMY Right 05/07/2013   Procedure: Right Lumbar one-two laminectomy;  Surgeon: Elaina Hoops, MD;  Location: [place] [facility]URO ORS;  Service: Neurosurgery;  Laterality: Right;  . LUMBAR LAMINECTOMY/DECOMPRESSION MICRODISCECTOMY Left 10/29/2013   Procedure: Left Lumbar five-Sacral one Laminectomy;  Surgeon: Elaina Hoops, MD;  Location: [place] NEURO ORS;  Service: Neurosurgery;  Laterality: Left;  . LUMBAR WOUND DEBRIDEMENT N/A 01/25/2014   Procedure: LUMBAR WOUND DEBRIDEMENT;  Surgeon: Charlie Pitter, MD;  Location: [place] NEURO ORS;  Service: Neurosurgery;  Laterality: N/A;  . LUMBAR WOUND DEBRIDEMENT N/A 02/25/2014   Procedure: LUMBAR WOUND DEBRIDEMENT;  Surgeon: Elaina Hoops, MD;  Location: [place] NEURO ORS;  Service: Neurosurgery;  Laterality: N/A;  .  LYSIS OF ADHESION N/A 03/08/2018   Procedure: LYSIS OF ADHESION;  Surgeon: Benjaman Kindler, MD;  Location: [facility];  Service: Gynecology;  Laterality: N/A;  . SALPINGOOPHORECTOMY Bilateral    1 1997. 2nd 2001  . SHOULDER ARTHROSCOPY WITH BICEPSTENOTOMY Left 03/24/2016   Procedure: shoulder arthroscopy with biceps TENOTOMY, removal loose body, limited synovectomy;  Surgeon: Leanor Kail, MD;  Location: [facility];  Service: Orthopedics;  Laterality: Left;  . shoulder sugery     7/17 rotator cuff  . SMALL BOWEL REPAIR N/A 03/08/2018   Procedure: SMALL BOWEL REPAIR;  Surgeon: Benjaman Kindler, MD;  Location: [facility];  Service: Gynecology;  Laterality: N/A;    Prior to Admission medications   Medication Sig Start Date End Date Taking? Authorizing Provider   acetaminophen (TYLENOL) 500 MG tablet Take 1,000 mg by mouth 2 (two) times daily as needed for moderate pain or headache.    [provider]  amitriptyline (ELAVIL) 50 MG tablet Take 50 mg by mouth at bedtime.    [provider]  BIOTIN PO Take 1 tablet by mouth daily.    [provider]  cyanocobalamin (,VITAMIN B-12,) 1000 MCG/ML injection Inject 1,000 mcg into the muscle every 30 (thirty) days. 12/25/17   [provider]  ergocalciferol (VITAMIN D2) 1.25 MG (50000 UT) capsule Take 50,000 Units by mouth 2 (two) times a week.    [provider]  gabapentin (NEURONTIN) 300 MG capsule Take 600-1,200 mg by mouth See admin instructions. Take 600 mg in the morning and afternoon, then take 1200 mg at bedtime    [provider]  insulin aspart (NOVOLOG FLEXPEN) 100 UNIT/ML FlexPen Inject 10-20 Units into the skin 3 (three) times daily with meals. Sliding scale Patient not taking: Reported on 04/22/2019 02/04/14   Kinnie Feil, MD  levocetirizine (XYZAL) 5 MG tablet Take 5 mg by mouth daily as needed for allergies.    [provider]  meclizine (ANTIVERT) 25 MG tablet Take 1 tablet (25 mg total) by mouth 3 (three) times daily as needed for dizziness. Patient not taking: Reported on 04/15/2019 12/12/17   Okey Regal, PA-C  Multiple Vitamin (MULTIVITAMIN) tablet Take 1 tablet by mouth daily.    [provider]  NARCAN 4 MG/0.1ML LIQD nasal spray kit Place 1 spray into the nose once. If no response after 2 minutes repeat in opposite nostril. 01/15/18   [provider]  nitroGLYCERIN (NITROSTAT) 0.4 MG SL tablet Place 0.4 mg under the tongue every 5 (five) minutes as needed for chest pain.    [provider]  oxycodone (ROXICODONE) 30 MG immediate release tablet Take 15 mg by mouth every 4 (four) hours as needed for pain.    [provider]  sitaGLIPtin (JANUVIA) 100 MG tablet Take 100 mg by mouth daily.     [provider]  tiZANidine (ZANAFLEX) 4 MG tablet Limit 1 tab by mouth per day or 2-3 times per day if tolerated Patient taking differently: Take 4 mg by mouth 3 (three) times daily.  04/19/16   Mohammed Kindle, MD  topiramate (TOPAMAX) 50 MG tablet Take 50-100 mg by mouth See admin instructions. Take 50 mg in the morning and 100 mg at night    [provider]  traZODone (DESYREL) 50 MG tablet Take 50 mg by mouth at bedtime.  11/26/17   [provider]    Allergies Bactrim [sulfamethoxazole-trimethoprim], Ciprofloxacin, Vancomycin, Amoxicillin, Erythromycin, Nsaids, Azithromycin, Ceftriaxone, and Food  Family History  Problem Relation Age  of Onset  . Depression Mother   . Drug abuse Mother   . Early death Mother   . Hypertension Mother   . Varicose Veins Mother   . Diabetes Father   . Early death Father   . Hyperlipidemia Father   . Heart Problems Father        enlarged geart  . Breast cancer Maternal Grandmother        60's  . Breast cancer Maternal Aunt     Social History Social History   Tobacco Use  . Smoking status: Never Smoker  . Smokeless tobacco: Never Used  Substance Use Topics  . Alcohol use: No  . Drug use: No    Review of Systems Constitutional: No fever/chills positive for generalized fatigue Eyes: No visual changes. ENT: No sore throat. Cardiovascular: Denies chest pain. Respiratory: Positive for shortness of breath. Gastrointestinal: No abdominal pain.  No nausea, no vomiting.  No diarrhea.  No constipation. Genitourinary: Negative for dysuria. Musculoskeletal: Negative for neck pain.  Negative for back pain. Integumentary: Negative for rash. Neurological: Negative for headaches, focal weakness or numbness.   ____________________________________________   PHYSICAL EXAM:  VITAL SIGNS: ED Triage Vitals  Enc Vitals Group     BP 09/23/19 2037 (!) 168/99     Pulse Rate 09/23/19 2037 (!) 104     Resp 09/23/19 2037 18      Temp 09/23/19 2037 98.7 F (37.1 C)     Temp Source 09/23/19 2037 Oral     SpO2 09/23/19 2037 100 %     Weight 09/23/19 2042 68.5 kg (151 lb)     Height 09/23/19 2042 1.651 m ('5\' 5"' )     Head Circumference --      Peak Flow --      Pain Score 09/23/19 2038 8     Pain Loc --      Pain Edu? --      Excl. in [place]? --     Constitutional: Alert and oriented.  Eyes: Conjunctivae are normal.  Mouth/Throat: Patient is wearing a mask. Neck: No stridor.  No meningeal signs.   Cardiovascular: Normal rate, regular rhythm. Good peripheral circulation. Grossly normal heart sounds. Respiratory: Normal respiratory effort.  No retractions. Gastrointestinal: Soft and nontender. No distention.  Musculoskeletal: No lower extremity tenderness nor edema. No gross deformities of extremities. Neurologic:  Normal speech and language. No gross focal neurologic deficits are appreciated.  Skin:  Skin is warm, dry and intact. Psychiatric: Mood and affect are normal. Speech and behavior are normal.  ____________________________________________   LABS (all labs ordered are listed, but only abnormal results are displayed)  Labs Reviewed  BASIC METABOLIC PANEL - Abnormal; Notable for the following components:      Result Value   Glucose, Bld 189 (*)    All other components within normal limits  CBC - Abnormal; Notable for the following components:   RBC 3.85 (*)    Hemoglobin 9.4 (*)    HCT 30.3 (*)    MCV 78.7 (*)    MCH 24.4 (*)    All other components within normal limits  GLUCOSE, CAPILLARY - Abnormal; Notable for the following components:   Glucose-Capillary 157 (*)    All other components within normal limits  URINALYSIS, COMPLETE (UACMP) WITH MICROSCOPIC  TYPE AND SCREEN  TROPONIN I (HIGH SENSITIVITY)   ____________________________________________  EKG ED ECG REPORT I, [place] N BROWN, the attending physician, personally viewed and interpreted this ECG.   Date: 09/24/2019  EKG  Time:  8:35 PM  Rate: 111  Rhythm: Sinus tachycardia  Axis: Normal  Intervals: Normal  ST&T Change: None  _____________________________________  RADIOLOGY I, [place] N BROWN, personally viewed and evaluated these images (plain radiographs) as part of my medical decision making, as well as reviewing the written report by the radiologist.  ED MD interpretation: No active cardiopulmonary disease noted on chest x-ray per radiologist.  Official radiology report(s): DG Chest 2 View  Result Date: 09/24/2019 CLINICAL DATA:  Dyspnea EXAM: CHEST - 2 VIEW COMPARISON:  March 12, 2018 FINDINGS: The heart size and mediastinal contours are within normal limits. No large airspace consolidation or pleural effusion. The lungs are clear. Cervical fixation hardware in the cervical spine. IMPRESSION: No active cardiopulmonary disease. Electronically Signed   By: Prudencio Pair M.D.   On: 09/24/2019 00:26    ___________ Procedures   ____________________________________________   INITIAL IMPRESSION / MDM / [place] / ED COURSE  As part of my medical decision making, I reviewed the following data within the electronic MEDICAL RECORD NUMBER 44 year old female presenting with above-stated history and physical exam with referral for iron infusion.  Patient's laboratory data notable for hemoglobin of 9.4 hematocrit of 30.3 with an MCV of 78.7 additional laboratory data unremarkable.  Patient did receive Feraheme infusion in the ED and referred to follow-up with Dr. Netty Starring.   Clinical Course as of Sep 23 233  Wed Sep 24, 2019  0009 Potassium: 3.8 [RB]    Clinical Course User Index [RB] Gregor Hams, MD     ____________________________________________  FINAL CLINICAL IMPRESSION(S) / ED DIAGNOSES  Final diagnoses:  Iron deficiency anemia, unspecified iron deficiency anemia type     MEDICATIONS GIVEN DURING THIS VISIT:  Medications  ferumoxytol (FERAHEME) 510 mg in sodium chloride 0.9 %  100 mL IVPB (0 mg Intravenous Stopped 09/24/19 0145)     ED Discharge Orders    None      *Please note:  Marissa [name] was evaluated in Emergency Department on 09/24/2019 for the symptoms described in the history of present illness. She was evaluated in the context of the global COVID-19 pandemic, which necessitated consideration that the patient might be at risk for infection with the SARS-CoV-2 virus that causes COVID-19. Institutional protocols and algorithms that pertain to the evaluation of patients at risk for COVID-19 are in a state of rapid change based on information released by regulatory bodies including the CDC and federal and state organizations. These policies and algorithms were followed during the patient's care in the ED.  Some ED evaluations and interventions may be delayed as a result of limited staffing during the pandemic.*  Note:  This document was prepared using Dragon voice recognition software and may include unintentional dictation errors.   Gregor Hams, MD 09/24/19 [id number]

## 2019-09-24 NOTE — ED Notes (Addendum)
Iv started , dr brown in with pt again   nsr on monitor.   Pt alert,

## 2019-09-26 ENCOUNTER — Encounter: Payer: Self-pay | Admitting: Oncology

## 2019-09-26 NOTE — Progress Notes (Signed)
Patient contacted for appt on 2/15. Patient was referred by Dr. Netty Starring for anemia. Pt reports getting iron infusion on Tuesday 2/9 at the hospital, and since then she has been having stomach pain. She saw Dr. Lanell Matar today on 2/12 and is going to start Protonix. She believes it might be an ulcer. Clarified with patient that she will be coming in for hematology consult and will not get an iron infusion that day.

## 2019-09-29 ENCOUNTER — Other Ambulatory Visit: Payer: Self-pay

## 2019-09-29 ENCOUNTER — Inpatient Hospital Stay: Payer: Medicare HMO | Attending: Oncology | Admitting: Oncology

## 2019-09-29 ENCOUNTER — Inpatient Hospital Stay: Payer: Medicare HMO

## 2019-09-29 ENCOUNTER — Encounter: Payer: Self-pay | Admitting: Oncology

## 2019-09-29 ENCOUNTER — Telehealth: Payer: Self-pay | Admitting: *Deleted

## 2019-09-29 VITALS — BP 122/82 | HR 89 | Temp 99.2°F | Resp 16 | Wt 159.4 lb

## 2019-09-29 DIAGNOSIS — R42 Dizziness and giddiness: Secondary | ICD-10-CM | POA: Diagnosis not present

## 2019-09-29 DIAGNOSIS — I252 Old myocardial infarction: Secondary | ICD-10-CM | POA: Insufficient documentation

## 2019-09-29 DIAGNOSIS — R5383 Other fatigue: Secondary | ICD-10-CM | POA: Insufficient documentation

## 2019-09-29 DIAGNOSIS — Z801 Family history of malignant neoplasm of trachea, bronchus and lung: Secondary | ICD-10-CM

## 2019-09-29 DIAGNOSIS — R0600 Dyspnea, unspecified: Secondary | ICD-10-CM | POA: Diagnosis not present

## 2019-09-29 DIAGNOSIS — D508 Other iron deficiency anemias: Secondary | ICD-10-CM

## 2019-09-29 DIAGNOSIS — D649 Anemia, unspecified: Secondary | ICD-10-CM

## 2019-09-29 DIAGNOSIS — Z803 Family history of malignant neoplasm of breast: Secondary | ICD-10-CM | POA: Diagnosis not present

## 2019-09-29 DIAGNOSIS — Z83438 Family history of other disorder of lipoprotein metabolism and other lipidemia: Secondary | ICD-10-CM | POA: Insufficient documentation

## 2019-09-29 DIAGNOSIS — D509 Iron deficiency anemia, unspecified: Secondary | ICD-10-CM | POA: Diagnosis not present

## 2019-09-29 DIAGNOSIS — Z818 Family history of other mental and behavioral disorders: Secondary | ICD-10-CM

## 2019-09-29 DIAGNOSIS — Z814 Family history of other substance abuse and dependence: Secondary | ICD-10-CM | POA: Diagnosis not present

## 2019-09-29 DIAGNOSIS — Z833 Family history of diabetes mellitus: Secondary | ICD-10-CM | POA: Diagnosis not present

## 2019-09-29 DIAGNOSIS — Z8249 Family history of ischemic heart disease and other diseases of the circulatory system: Secondary | ICD-10-CM | POA: Diagnosis not present

## 2019-09-29 HISTORY — DX: Iron deficiency anemia, unspecified: D50.9

## 2019-09-29 LAB — CBC WITH DIFFERENTIAL/PLATELET
Abs Immature Granulocytes: 0.01 10*3/uL (ref 0.00–0.07)
Basophils Absolute: 0 10*3/uL (ref 0.0–0.1)
Basophils Relative: 0 %
Eosinophils Absolute: 0.1 10*3/uL (ref 0.0–0.5)
Eosinophils Relative: 1 %
HCT: 34.8 % — ABNORMAL LOW (ref 36.0–46.0)
Hemoglobin: 10.1 g/dL — ABNORMAL LOW (ref 12.0–15.0)
Immature Granulocytes: 0 %
Lymphocytes Relative: 27 %
Lymphs Abs: 1.2 10*3/uL (ref 0.7–4.0)
MCH: 24.5 pg — ABNORMAL LOW (ref 26.0–34.0)
MCHC: 29 g/dL — ABNORMAL LOW (ref 30.0–36.0)
MCV: 84.5 fL (ref 80.0–100.0)
Monocytes Absolute: 0.3 10*3/uL (ref 0.1–1.0)
Monocytes Relative: 6 %
Neutro Abs: 2.8 10*3/uL (ref 1.7–7.7)
Neutrophils Relative %: 66 %
Platelets: 207 10*3/uL (ref 150–400)
RBC: 4.12 MIL/uL (ref 3.87–5.11)
RDW: 17.2 % — ABNORMAL HIGH (ref 11.5–15.5)
WBC: 4.4 10*3/uL (ref 4.0–10.5)
nRBC: 0 % (ref 0.0–0.2)

## 2019-09-29 LAB — COMPREHENSIVE METABOLIC PANEL
ALT: 19 U/L (ref 0–44)
AST: 31 U/L (ref 15–41)
Albumin: 3.8 g/dL (ref 3.5–5.0)
Alkaline Phosphatase: 90 U/L (ref 38–126)
Anion gap: 8 (ref 5–15)
BUN: 10 mg/dL (ref 6–20)
CO2: 29 mmol/L (ref 22–32)
Calcium: 9.3 mg/dL (ref 8.9–10.3)
Chloride: 106 mmol/L (ref 98–111)
Creatinine, Ser: 0.86 mg/dL (ref 0.44–1.00)
GFR calc Af Amer: >60 mL/min (ref >60)
GFR calc non Af Amer: >60 mL/min (ref >60)
Glucose, Bld: 95 mg/dL (ref 70–99)
Potassium: 4.2 mmol/L (ref 3.5–5.1)
Sodium: 143 mmol/L (ref 135–145)
Total Bilirubin: 0.5 mg/dL (ref 0.3–1.2)
Total Protein: 7.4 g/dL (ref 6.5–8.1)

## 2019-09-29 LAB — IRON AND TIBC
Iron: 76 ug/dL (ref 28–170)
Saturation Ratios: 17 % (ref 10.4–31.8)
TIBC: 454 ug/dL — ABNORMAL HIGH (ref 250–450)
UIBC: 378 ug/dL

## 2019-09-29 LAB — FERRITIN: Ferritin: 233 ng/mL (ref 11–307)

## 2019-09-29 LAB — FOLATE: Folate: 16.2 ng/mL (ref >5.9)

## 2019-09-29 LAB — VITAMIN B12: Vitamin B-12: 1628 pg/mL — ABNORMAL HIGH (ref 180–914)

## 2019-09-29 NOTE — Telephone Encounter (Addendum)
Letter created and sent to patient via MyChart and patient is aware.

## 2019-09-29 NOTE — Telephone Encounter (Signed)
Patient called asking for a letter/ note fpr work regarding her appointment today and she would like a return call.

## 2019-09-29 NOTE — Progress Notes (Addendum)
Hematology/Oncology Consult note [facility] [phone] [phone]   Patient Care Team: [name], MD as PCP - General (Family Medicine) Theodis Sato, Gertha Calkin, MD as Consulting Physician (Cardiology) Kary Kos, MD as Consulting Physician (Neurosurgery)  REFERRING PROVIDER: [name], MD CHIEF COMPLAINTS/REASON FOR VISIT:  Evaluation of iron deficiency anemia  HISTORY OF PRESENTING ILLNESS:  Marissa [name] is a  44 y.o.  female with PMH listed below was seen in consultation at the request of [name], MD   for evaluation of iron deficiency anemia.   Reviewed patient's recent labs  09/23/2019 labs revealed anemia with hemoglobin of 9.4, MCV 78.7.  Patient was referred to [facility] emergency room on 09/24/2019 for evaluation and IV iron infusion.  Patient reports approximately 4 weeks.  Of generalized fatigue, dizziness and feeling cold. Reviewed patient's previous labs ordered by primary care physician's office, anemia is chronic onset, since at least 2014. I will have any recent iron panel.  02/01/2024 iron saturation 10, ferritin 106. Patient received IV Feraheme and he tolerates well. 09/27/2019, patient went to [facility] ER for evaluation of epigastric pain.  She had CT abdomen pelvis done which showed no acute pathology within the abdomen or pelvis. Associated signs and symptoms: Patient reports fatigue, dizziness and feeling cold.  Denies SOB with exertion.  Denies weight loss, easy bruising, hematochezia, hemoptysis, hematuria. Context:  History of iron deficiency:  Rectal bleeding: Denies Menstrual bleeding/ Vaginal bleeding : LMP 12/21/2014, history of salpingo-oophorectomy bilateral. Hematemesis or hemoptysis : denies Blood in urine : denies  Last endoscopy: Patient had a history of laparoscopic gastric sleeve Fatigue: Yes.  SOB: deneis    Review of Systems  Constitutional: Positive for fatigue. Negative for  appetite change, chills and fever.  HENT:   Negative for hearing loss and voice change.   Eyes: Negative for eye problems.  Respiratory: Negative for chest tightness, cough and shortness of breath.   Cardiovascular: Negative for chest pain.  Gastrointestinal: Positive for abdominal pain. Negative for abdominal distention and blood in stool.  Endocrine: Negative for hot flashes.  Genitourinary: Negative for difficulty urinating and frequency.   Musculoskeletal: Negative for arthralgias.  Skin: Negative for itching and rash.  Neurological: Negative for extremity weakness.  Hematological: Negative for adenopathy.  Psychiatric/Behavioral: Negative for confusion.    MEDICAL HISTORY:  Past Medical History:  Diagnosis Date  . Allergy   . Anemia   . Anxiety   . Arthritis   . Back pain, chronic   . Cardiomyopathy, dilated, nonischemic ([place])   . CHF (congestive heart failure) ([place])   . Cholelithiasis   . Coronary artery disease   . Depression   . Diabetes mellitus without complication (HCC)    fasting cbg 50-140s  . Dysrhythmia    tachycardia  . GERD (gastroesophageal reflux disease)    otc meds  . Headache(784.0)   . Hypercholesteremia   . Hyperlipemia   . Hypertension   . Insulin pump in place    pt had insulin pump but it is now removed (02-18-16)  . MI, old    2006  . Migraines    once/month maybe - can last up to two weeks  . Myocardial infarction [facility]) 2006   "due to medication"; no evidence of ischemia or infarction by nuclear stress test '11  . Neck pain, chronic   . Neuropathy    legs  . Pneumonia 2015   [facility]  . Prolonged QT interval syndrome   . Renal insufficiency   .  Restless leg syndrome, controlled   . Sepsis ([place]) 2016   secondary to surgery  . Sleep apnea    does not use cpap since losing alot of weight  . Vertigo    nop episodes 2020  . Vision loss    due to diabetes  . Wears contact lenses   . Wears dentures    partial lower    SURGICAL  HISTORY: Past Surgical History:  Procedure Laterality Date  . ACHILLES TENDON SURGERY Right 04/22/2019   Procedure: [facility] PROCEDURE WITH SUTURE ANCHOR;  Surgeon: Caroline More, DPM;  Location: [place];  Service: Podiatry;  Laterality: Right;  . APPENDECTOMY N/A 03/08/2018   Procedure: APPENDECTOMY;  Surgeon: Benjaman Kindler, MD;  Location: [facility] ORS;  Service: Gynecology;  Laterality: N/A;  By Dr. Windell Moment  . BACK SURGERY  2011   Lumbar   . BOWEL RESECTION N/A 03/08/2018   Procedure: SMALL BOWEL RESECTION;  Surgeon: Benjaman Kindler, MD;  Location: [facility] ORS;  Service: Gynecology;  Laterality: N/A;  . CARDIAC CATHETERIZATION  07/18/2004   50-60% mid LAD, minor luminal irregularities RCA, normal LM and CX, EF 50-55% [facility])  . carpel tunnel Bilateral   . CERVICAL FUSION  2006  . CHOLECYSTECTOMY N/A 04/08/2015   Procedure: LAPAROSCOPIC CHOLECYSTECTOMY WITH INTRAOPERATIVE CHOLANGIOGRAM;  Surgeon: Florene Glen, MD;  Location: [facility] ORS;  Service: General;  Laterality: N/A;  . COLON RESECTION N/A 03/16/2018   Procedure: COLON RESECTION;  Surgeon: Florene Glen, MD;  Location: [facility] OR[facility];  Service: General;  Laterality: N/A;  . HAMMER TOE SURGERY Right 04/22/2019   Procedure: HAMMER TOE CORRECTION X 4;  Surgeon: Caroline More, DPM;  Location: [place];  Service: Podiatry;  Laterality: Right;  Diabetic - insulin and oral meds  . LAPAROSCOPIC GASTRECTOMY  05/31/2017   [facility], Dr. Darnell Level  . LUMBAR LAMINECTOMY/DECOMPRESSION MICRODISCECTOMY Right 05/07/2013   Procedure: Right Lumbar one-two laminectomy;  Surgeon: Elaina Hoops, MD;  Location: [place] NEURO ORS;  Service: Neurosurgery;  Laterality: Right;  . LUMBAR LAMINECTOMY/DECOMPRESSION MICRODISCECTOMY Left 10/29/2013   Procedure: Left Lumbar five-Sacral one Laminectomy;  Surgeon: Elaina Hoops, MD;  Location: [place] NEURO ORS;  Service: Neurosurgery;  Laterality: Left;  . LUMBAR WOUND DEBRIDEMENT N/A 01/25/2014    Procedure: LUMBAR WOUND DEBRIDEMENT;  Surgeon: Charlie Pitter, MD;  Location: [place] NEURO ORS;  Service: Neurosurgery;  Laterality: N/A;  . LUMBAR WOUND DEBRIDEMENT N/A 02/25/2014   Procedure: LUMBAR WOUND DEBRIDEMENT;  Surgeon: Elaina Hoops, MD;  Location: [place] NEURO ORS;  Service: Neurosurgery;  Laterality: N/A;  . LYSIS OF ADHESION N/A 03/08/2018   Procedure: LYSIS OF ADHESION;  Surgeon: Benjaman Kindler, MD;  Location: [facility];  Service: Gynecology;  Laterality: N/A;  . SALPINGOOPHORECTOMY Bilateral    1 1997. 2nd 2001  . SHOULDER ARTHROSCOPY WITH BICEPSTENOTOMY Left 03/24/2016   Procedure: shoulder arthroscopy with biceps TENOTOMY, removal loose body, limited synovectomy;  Surgeon: Leanor Kail, MD;  Location: [facility] OR[facility];  Service: Orthopedics;  Laterality: Left;  . shoulder sugery     7/17 rotator cuff  . SMALL BOWEL REPAIR N/A 03/08/2018   Procedure: SMALL BOWEL REPAIR;  Surgeon: Benjaman Kindler, MD;  Location: [facility] OR[facility];  Service: Gynecology;  Laterality: N/A;    SOCIAL HISTORY: Social History   Socioeconomic History  . Marital status: Married    Spouse name: Not on file  . Number of children: 0  . Years of education: College  . Highest education level: Not on file  Occupational  History  . Occupation: Glass blower/designer  Tobacco Use  . Smoking status: Never Smoker  . Smokeless tobacco: Never Used  Substance and Sexual Activity  . Alcohol use: No  . Drug use: No  . Sexual activity: Yes    Birth control/protection: Surgical  Other Topics Concern  . Not on file  Social History Narrative   Lives at home with her fiance.   Right-handed.   Occasional caffeine use.   Social Determinants of Health   Financial Resource Strain:   . Difficulty of Paying Living Expenses: Not on file  Food Insecurity:   . Worried About Charity fundraiser in the Last Year: Not on file  . Ran Out of Food in the Last Year: Not on file  Transportation Needs:   . Lack of Transportation (Medical): Not on  file  . Lack of Transportation (Non-Medical): Not on file  Physical Activity:   . Days of Exercise per Week: Not on file  . Minutes of Exercise per Session: Not on file  Stress:   . Feeling of Stress : Not on file  Social Connections:   . Frequency of Communication with Friends and Family: Not on file  . Frequency of Social Gatherings with Friends and Family: Not on file  . Attends Religious Services: Not on file  . Active Member of Clubs or Organizations: Not on file  . Attends Archivist Meetings: Not on file  . Marital Status: Not on file  Intimate Partner Violence:   . Fear of Current or Ex-Partner: Not on file  . Emotionally Abused: Not on file  . Physically Abused: Not on file  . Sexually Abused: Not on file    FAMILY HISTORY: Family History  Problem Relation Age of Onset  . Depression Mother   . Drug abuse Mother   . Early death Mother   . Hypertension Mother   . Varicose Veins Mother   . CVA Mother   . Diabetes Father   . Early death Father   . Hyperlipidemia Father   . Heart Problems Father        enlarged geart  . Breast cancer Maternal Grandmother        60's  . Breast cancer Maternal Aunt   . Lung cancer Maternal Grandfather     ALLERGIES:  is allergic to bactrim [sulfamethoxazole-trimethoprim]; ciprofloxacin; vancomycin; amoxicillin; erythromycin; nsaids; azithromycin; ceftriaxone; and food.  MEDICATIONS:  Current Outpatient Medications  Medication Sig Dispense Refill  . acetaminophen (TYLENOL) 500 MG tablet Take 1,000 mg by mouth 2 (two) times daily as needed for moderate pain or headache.    Marland Kitchen amitriptyline (ELAVIL) 50 MG tablet Take 50 mg by mouth at bedtime.    Marland Kitchen atorvastatin (LIPITOR) 80 MG tablet Take 80 mg by mouth at bedtime.    Marland Kitchen BIOTIN PO Take 1 tablet by mouth daily.    . cyanocobalamin (,VITAMIN B-12,) 1000 MCG/ML injection Inject 1,000 mcg into the muscle every 30 (thirty) days.  3  . ergocalciferol (VITAMIN D2) 1.25 MG (50000 UT)  capsule Take 50,000 Units by mouth 2 (two) times a week.    . gabapentin (NEURONTIN) 300 MG capsule Take 600-1,200 mg by mouth See admin instructions. Take 600 mg in the morning and afternoon, then take 1200 mg at bedtime    . meclizine (ANTIVERT) 25 MG tablet Take 1 tablet (25 mg total) by mouth 3 (three) times daily as needed for dizziness. 30 tablet 0  . midodrine (PROAMATINE) 2.5 MG tablet  Take by mouth.    . Multiple Vitamin (MULTIVITAMIN) tablet Take 1 tablet by mouth daily.    Marland Kitchen oxycodone (ROXICODONE) 30 MG immediate release tablet Take 15 mg by mouth every 4 (four) hours as needed for pain.    Marland Kitchen OZEMPIC, 0.25 OR 0.5 MG/DOSE, 2 MG/1.5ML SOPN INJECT 0.375 MLS (0.5 MG TOTAL) SUBCUTANEOUSLY ONCE A WEEK FOR 30 DAYS    . pantoprazole (PROTONIX) 40 MG tablet Take by mouth.    . sitaGLIPtin (JANUVIA) 100 MG tablet Take 100 mg by mouth daily.    . sucralfate (CARAFATE) 1 GM/10ML suspension Take by mouth.    Marland Kitchen tiZANidine (ZANAFLEX) 4 MG tablet Limit 1 tab by mouth per day or 2-3 times per day if tolerated (Patient taking differently: Take 4 mg by mouth 3 (three) times daily. ) 90 tablet 0  . traZODone (DESYREL) 50 MG tablet Take 50 mg by mouth at bedtime.   1  . insulin aspart (NOVOLOG FLEXPEN) 100 UNIT/ML FlexPen Inject 10-20 Units into the skin 3 (three) times daily with meals. Sliding scale (Patient not taking: Reported on 04/22/2019) 15 mL 11  . levocetirizine (XYZAL) 5 MG tablet Take 5 mg by mouth daily as needed for allergies.    Marland Kitchen NARCAN 4 MG/0.1ML LIQD nasal spray kit Place 1 spray into the nose once. If no response after 2 minutes repeat in opposite nostril.  0  . nitroGLYCERIN (NITROSTAT) 0.4 MG SL tablet Place 0.4 mg under the tongue every 5 (five) minutes as needed for chest pain.    Marland Kitchen topiramate (TOPAMAX) 50 MG tablet Take 50-100 mg by mouth See admin instructions. Take 50 mg in the morning and 100 mg at night     No current facility-administered medications for this visit.      PHYSICAL EXAMINATION: ECOG PERFORMANCE STATUS: 1 - Symptomatic but completely ambulatory Vitals:   09/29/19 1010  BP: 122/82  Pulse: 89  Resp: 16  Temp: 99.2 F (37.3 C)   Filed Weights   09/29/19 1010  Weight: 159 lb 6.4 oz (72.3 kg)    Physical Exam Constitutional:      General: She is not in acute distress. HENT:     Head: Normocephalic and atraumatic.  Eyes:     General: No scleral icterus. Cardiovascular:     Rate and Rhythm: Normal rate and regular rhythm.     Heart sounds: Normal heart sounds.  Pulmonary:     Effort: Pulmonary effort is normal. No respiratory distress.     Breath sounds: No wheezing.  Abdominal:     General: Bowel sounds are normal. There is no distension.     Palpations: Abdomen is soft.  Musculoskeletal:        General: No deformity. Normal range of motion.     Cervical back: Normal range of motion and neck supple.  Skin:    General: Skin is warm and dry.     Findings: No erythema or rash.  Neurological:     Mental Status: She is alert and oriented to person, place, and time. Mental status is at baseline.     Cranial Nerves: No cranial nerve deficit.     Coordination: Coordination normal.  Psychiatric:        Mood and Affect: Mood normal.       CMP Latest Ref Rng & Units 09/29/2019  Glucose 70 - 99 mg/dL 95  BUN 6 - 20 mg/dL 10  Creatinine 0.44 - 1.00 mg/dL 0.86  Sodium 135 - 145  mmol/L 143  Potassium 3.5 - 5.1 mmol/L 4.2  Chloride 98 - 111 mmol/L 106  CO2 22 - 32 mmol/L 29  Calcium 8.9 - 10.3 mg/dL 9.3  Total Protein 6.5 - 8.1 g/dL 7.4  Total Bilirubin 0.3 - 1.2 mg/dL 0.5  Alkaline Phos 38 - 126 U/L 90  AST 15 - 41 U/L 31  ALT 0 - 44 U/L 19   CBC Latest Ref Rng & Units 09/29/2019  WBC 4.0 - 10.5 K/uL 4.4  Hemoglobin 12.0 - 15.0 g/dL 10.1(L)  Hematocrit 36.0 - 46.0 % 34.8(L)  Platelets 150 - 400 K/uL 207     LABORATORY DATA:  I have reviewed the data as listed Lab Results  Component Value Date   WBC 4.4  09/29/2019   HGB 10.1 (L) 09/29/2019   HCT 34.8 (L) 09/29/2019   MCV 84.5 09/29/2019   PLT 207 09/29/2019   Recent Labs    09/23/19 2050 09/29/19 1050  NA 141 143  K 3.8 4.2  CL 107 106  CO2 23 29  GLUCOSE 189* 95  BUN 13 10  CREATININE 0.98 0.86  CALCIUM 9.2 9.3  GFRNONAA >60 >60  GFRAA >60 >60  PROT  --  7.4  ALBUMIN  --  3.8  AST  --  31  ALT  --  19  ALKPHOS  --  90  BILITOT  --  0.5   Iron/TIBC/Ferritin/ %Sat    Component Value Date/Time   IRON 76 09/29/2019 1050   TIBC 454 (H) 09/29/2019 1050   FERRITIN 233 09/29/2019 1050   IRONPCTSAT 17 09/29/2019 1050     DG Chest 2 View  Result Date: 09/24/2019 CLINICAL DATA:  Dyspnea EXAM: CHEST - 2 VIEW COMPARISON:  March 12, 2018 FINDINGS: The heart size and mediastinal contours are within normal limits. No large airspace consolidation or pleural effusion. The lungs are clear. Cervical fixation hardware in the cervical spine. IMPRESSION: No active cardiopulmonary disease. Electronically Signed   By: Prudencio Pair M.D.   On: 09/24/2019 00:26      ASSESSMENT & PLAN:  1. Other iron deficiency anemia   2. Other fatigue    Labs are reviewed and discussed with patient.  I recommend to check CBC, obtain baseline iron, TIBC, ferritin, check vitamin B12 and folate, zinc and copper level.  Check CMP. Lab results were available after patient's clinic. Labs were reviewed.  CBC showed improved hemoglobin to 10.1 compared to a week ago.  MCV has also improved to 84.5. Normal CMP.  Adequate vitamin B12 and folate level Iron panel showed iron saturation of 17, TIBC 454, ferritin 233.  This is reflecting her iron level after 1 dose of Feraheme. Given that patient does have increased TIBC, borderline iron saturation, I think is reasonable to proceed with another IV Feraheme treatment for further improve her iron stores. Copper and zinc levels are pending.  Plan IV iron.  Allergy reactions/infusion reaction including anaphylactic  reaction discussed with patient. Other side effects include but not limited to high blood pressure, skin rash, weight gain, leg swelling, etc. Patient voices understanding and willing to proceed.- 09/30/2019 addendum, Venofer is the preferred IV iron medication per patient's insurance.  We will switch from Feraheme to Venofer.  Etiology of iron deficiency anemia likely secondary to malabsorption from gastric sleeve versus chronic blood loss from GI tract. Advised patient to make follow-up appointments with her bariatric surgeon/gastroenterology for endoscopy evaluation. Continue PPI.  Fatigue, may be secondary to anemia.  Hopefully she  will have some improvement after IV iron.  Orders Placed This Encounter  Procedures  . CBC with Differential/Platelet    Standing Status:   Future    Number of Occurrences:   1    Standing Expiration Date:   09/28/2020  . Iron and TIBC    Standing Status:   Future    Number of Occurrences:   1    Standing Expiration Date:   09/28/2020  . Ferritin    Standing Status:   Future    Number of Occurrences:   1    Standing Expiration Date:   09/28/2020  . Vitamin B12    Standing Status:   Future    Number of Occurrences:   1    Standing Expiration Date:   09/28/2020  . Folate    Standing Status:   Future    Number of Occurrences:   1    Standing Expiration Date:   09/28/2020  . Copper, serum    Standing Status:   Future    Number of Occurrences:   1    Standing Expiration Date:   09/28/2020  . Comprehensive metabolic panel    Standing Status:   Future    Number of Occurrences:   1    Standing Expiration Date:   09/28/2020  . Zinc    Standing Status:   Future    Number of Occurrences:   1    Standing Expiration Date:   09/28/2020  . Sample to Blood Bank    Standing Status:   Future    Number of Occurrences:   1    Standing Expiration Date:   09/28/2020    All questions were answered. The patient knows to call the clinic with any problems questions or  concerns.  Cc [name], MD  Return of visit: 8 weeks Thank you for this kind referral and the opportunity to participate in the care of this patient. A copy of today's note is routed to referring provider      Earlie Server, MD, PhD Hematology Oncology [facility] Pager- [phone] 09/29/2019

## 2019-09-30 ENCOUNTER — Telehealth: Payer: Self-pay

## 2019-09-30 LAB — SAMPLE TO BLOOD BANK

## 2019-09-30 NOTE — Telephone Encounter (Signed)
Pt notified via My Chart

## 2019-09-30 NOTE — Telephone Encounter (Signed)
-----   Message from Earlie Server, MD sent at 09/29/2019  7:56 PM EST ----- Please arrange patient to have IV Feraheme x1 this week or next week. Her follow-up plan is lab MD+/-Feraheme in 8 weeks.  Labs prior-ordered.  Either in person or virtual.  Thank you  Dear Marissa [name],  It was very nice to meet you today. Some of your blood work results have come back.  Your blood level has improved, likely secondary to recent IV iron infusion.  I have reviewed the iron panel and I recommend you to proceed with another dose of IV iron which hopefully will further improve your blood level. You will be contacted for IV iron, and a follow-up appointment with me in 8 weeks.  Dr.Yu

## 2019-09-30 NOTE — Telephone Encounter (Signed)
Patient informed of medication change 

## 2019-09-30 NOTE — Telephone Encounter (Signed)
Appt notes for 2/24 appt  had already been changed by Aleen Sells. I went in and got pt scheduled for 2nd dose of Venofer.

## 2019-09-30 NOTE — Telephone Encounter (Signed)
Received at ER but will be first time given at our clinic.

## 2019-09-30 NOTE — Telephone Encounter (Signed)
Pt is aware of all scheduled appts.Marissa [name]

## 2019-09-30 NOTE — Telephone Encounter (Signed)
Will this 1st dose of Feraheme be NEW for her?

## 2019-09-30 NOTE — Addendum Note (Signed)
Addended by: Earlie Server on: 09/30/2019 03:59 PM   Modules accepted: Orders

## 2019-09-30 NOTE — Telephone Encounter (Signed)
-----   Message from Earlie Server, MD sent at 09/30/2019  4:00 PM EST ----- Regarding: RE: Feraheme versus venofer Aleen Sells, I changed to Venofer. Thx Team, please let patient know that treatment will be changed to Venofer due to reason below.  And I recommend her to do IV Venofer weekly x 2. Please schedule. Same follow up plan.   [place] ----- Message ----- From: Floy Sabina Sent: 09/30/2019  10:37 AM EST To: Earlie Server, MD Subject: Marissa [name] versus venofer                        Patient has [facility] representative is their preferred drug.  Please change your treatment plan to reflect this.    Thanks, Aleen Sells

## 2019-10-01 LAB — COPPER, SERUM: Copper: 155 ug/dL (ref 80–158)

## 2019-10-01 LAB — ZINC: Zinc: 93 ug/dL (ref 44–115)

## 2019-10-08 ENCOUNTER — Inpatient Hospital Stay: Payer: Medicare HMO | Attending: Oncology

## 2019-10-08 ENCOUNTER — Other Ambulatory Visit: Payer: Self-pay

## 2019-10-08 VITALS — BP 127/75 | HR 98 | Temp 99.3°F | Resp 18

## 2019-10-08 DIAGNOSIS — R109 Unspecified abdominal pain: Secondary | ICD-10-CM | POA: Diagnosis not present

## 2019-10-08 DIAGNOSIS — Z801 Family history of malignant neoplasm of trachea, bronchus and lung: Secondary | ICD-10-CM | POA: Diagnosis not present

## 2019-10-08 DIAGNOSIS — R5383 Other fatigue: Secondary | ICD-10-CM | POA: Diagnosis not present

## 2019-10-08 DIAGNOSIS — Z803 Family history of malignant neoplasm of breast: Secondary | ICD-10-CM | POA: Diagnosis not present

## 2019-10-08 DIAGNOSIS — Z814 Family history of other substance abuse and dependence: Secondary | ICD-10-CM | POA: Diagnosis not present

## 2019-10-08 DIAGNOSIS — Z818 Family history of other mental and behavioral disorders: Secondary | ICD-10-CM | POA: Insufficient documentation

## 2019-10-08 DIAGNOSIS — Z820 Family history of epilepsy and other diseases of the nervous system: Secondary | ICD-10-CM | POA: Diagnosis not present

## 2019-10-08 DIAGNOSIS — I252 Old myocardial infarction: Secondary | ICD-10-CM | POA: Diagnosis not present

## 2019-10-08 DIAGNOSIS — Z833 Family history of diabetes mellitus: Secondary | ICD-10-CM | POA: Diagnosis not present

## 2019-10-08 DIAGNOSIS — Z8249 Family history of ischemic heart disease and other diseases of the circulatory system: Secondary | ICD-10-CM | POA: Insufficient documentation

## 2019-10-08 DIAGNOSIS — Z83438 Family history of other disorder of lipoprotein metabolism and other lipidemia: Secondary | ICD-10-CM | POA: Diagnosis not present

## 2019-10-08 DIAGNOSIS — D508 Other iron deficiency anemias: Secondary | ICD-10-CM | POA: Insufficient documentation

## 2019-10-08 DIAGNOSIS — D509 Iron deficiency anemia, unspecified: Secondary | ICD-10-CM

## 2019-10-08 DIAGNOSIS — Z79899 Other long term (current) drug therapy: Secondary | ICD-10-CM | POA: Insufficient documentation

## 2019-10-08 MED ORDER — SODIUM CHLORIDE 0.9 % IV SOLN
Freq: Once | INTRAVENOUS | Status: AC
  Filled 2019-10-08: qty 250

## 2019-10-08 MED ORDER — IRON SUCROSE 20 MG/ML IV SOLN
200.0000 mg | Freq: Once | INTRAVENOUS | Status: AC
  Administered 2019-10-08: 200 mg via INTRAVENOUS
  Filled 2019-10-08: qty 10

## 2019-10-08 MED ORDER — SODIUM CHLORIDE 0.9 % IV SOLN: 200.0000 mg | Freq: Once | INTRAVENOUS | Status: DC

## 2019-10-15 ENCOUNTER — Inpatient Hospital Stay: Payer: Medicare HMO | Attending: Oncology

## 2019-10-15 ENCOUNTER — Other Ambulatory Visit: Payer: Self-pay

## 2019-10-15 VITALS — BP 103/71 | HR 95 | Temp 98.0°F | Resp 18

## 2019-10-15 DIAGNOSIS — Z814 Family history of other substance abuse and dependence: Secondary | ICD-10-CM | POA: Diagnosis not present

## 2019-10-15 DIAGNOSIS — Z803 Family history of malignant neoplasm of breast: Secondary | ICD-10-CM | POA: Diagnosis not present

## 2019-10-15 DIAGNOSIS — R5383 Other fatigue: Secondary | ICD-10-CM | POA: Insufficient documentation

## 2019-10-15 DIAGNOSIS — Z801 Family history of malignant neoplasm of trachea, bronchus and lung: Secondary | ICD-10-CM | POA: Insufficient documentation

## 2019-10-15 DIAGNOSIS — Z79899 Other long term (current) drug therapy: Secondary | ICD-10-CM | POA: Diagnosis not present

## 2019-10-15 DIAGNOSIS — D509 Iron deficiency anemia, unspecified: Secondary | ICD-10-CM | POA: Insufficient documentation

## 2019-10-15 DIAGNOSIS — Z83438 Family history of other disorder of lipoprotein metabolism and other lipidemia: Secondary | ICD-10-CM | POA: Diagnosis not present

## 2019-10-15 DIAGNOSIS — Z8249 Family history of ischemic heart disease and other diseases of the circulatory system: Secondary | ICD-10-CM | POA: Diagnosis not present

## 2019-10-15 DIAGNOSIS — R109 Unspecified abdominal pain: Secondary | ICD-10-CM | POA: Diagnosis not present

## 2019-10-15 DIAGNOSIS — Z818 Family history of other mental and behavioral disorders: Secondary | ICD-10-CM | POA: Insufficient documentation

## 2019-10-15 DIAGNOSIS — Z833 Family history of diabetes mellitus: Secondary | ICD-10-CM | POA: Diagnosis not present

## 2019-10-15 MED ORDER — SODIUM CHLORIDE 0.9 % IV SOLN: 200.0000 mg | Freq: Once | INTRAVENOUS | Status: DC

## 2019-10-15 MED ORDER — SODIUM CHLORIDE 0.9 % IV SOLN
Freq: Once | INTRAVENOUS | Status: AC
  Filled 2019-10-15: qty 250

## 2019-10-15 MED ORDER — IRON SUCROSE 20 MG/ML IV SOLN
200.0000 mg | Freq: Once | INTRAVENOUS | Status: AC
  Administered 2019-10-15: 200 mg via INTRAVENOUS
  Filled 2019-10-15: qty 10

## 2019-10-22 ENCOUNTER — Ambulatory Visit
Admission: RE | Admit: 2019-10-22 | Discharge: 2019-10-22 | Disposition: A | Discharge status: Home / Self Care | Payer: Medicare HMO | Attending: Internal Medicine | Admitting: Internal Medicine

## 2019-10-22 ENCOUNTER — Encounter
Admission: RE | Disposition: A | Discharge status: Home / Self Care | Payer: Self-pay | Source: Home / Self Care | Attending: Internal Medicine

## 2019-10-22 ENCOUNTER — Ambulatory Visit: Payer: Medicare HMO | Admitting: Certified Registered Nurse Anesthetist

## 2019-10-22 ENCOUNTER — Other Ambulatory Visit: Payer: Self-pay

## 2019-10-22 DIAGNOSIS — Z794 Long term (current) use of insulin: Secondary | ICD-10-CM | POA: Insufficient documentation

## 2019-10-22 DIAGNOSIS — R1310 Dysphagia, unspecified: Secondary | ICD-10-CM | POA: Insufficient documentation

## 2019-10-22 DIAGNOSIS — I110 Hypertensive heart disease with heart failure: Secondary | ICD-10-CM | POA: Diagnosis not present

## 2019-10-22 DIAGNOSIS — F329 Major depressive disorder, single episode, unspecified: Secondary | ICD-10-CM | POA: Insufficient documentation

## 2019-10-22 DIAGNOSIS — I509 Heart failure, unspecified: Secondary | ICD-10-CM | POA: Insufficient documentation

## 2019-10-22 DIAGNOSIS — E1140 Type 2 diabetes mellitus with diabetic neuropathy, unspecified: Secondary | ICD-10-CM | POA: Insufficient documentation

## 2019-10-22 DIAGNOSIS — G4730 Sleep apnea, unspecified: Secondary | ICD-10-CM | POA: Diagnosis not present

## 2019-10-22 DIAGNOSIS — K640 First degree hemorrhoids: Secondary | ICD-10-CM | POA: Diagnosis not present

## 2019-10-22 DIAGNOSIS — Z9884 Bariatric surgery status: Secondary | ICD-10-CM | POA: Insufficient documentation

## 2019-10-22 DIAGNOSIS — G43909 Migraine, unspecified, not intractable, without status migrainosus: Secondary | ICD-10-CM | POA: Insufficient documentation

## 2019-10-22 DIAGNOSIS — K219 Gastro-esophageal reflux disease without esophagitis: Secondary | ICD-10-CM | POA: Insufficient documentation

## 2019-10-22 DIAGNOSIS — I420 Dilated cardiomyopathy: Secondary | ICD-10-CM | POA: Insufficient documentation

## 2019-10-22 DIAGNOSIS — R1013 Epigastric pain: Secondary | ICD-10-CM | POA: Diagnosis not present

## 2019-10-22 DIAGNOSIS — D509 Iron deficiency anemia, unspecified: Secondary | ICD-10-CM | POA: Diagnosis not present

## 2019-10-22 DIAGNOSIS — E7800 Pure hypercholesterolemia, unspecified: Secondary | ICD-10-CM | POA: Diagnosis not present

## 2019-10-22 DIAGNOSIS — I2510 Atherosclerotic heart disease of native coronary artery without angina pectoris: Secondary | ICD-10-CM | POA: Diagnosis not present

## 2019-10-22 DIAGNOSIS — G2581 Restless legs syndrome: Secondary | ICD-10-CM | POA: Insufficient documentation

## 2019-10-22 DIAGNOSIS — K5730 Diverticulosis of large intestine without perforation or abscess without bleeding: Secondary | ICD-10-CM | POA: Insufficient documentation

## 2019-10-22 DIAGNOSIS — I252 Old myocardial infarction: Secondary | ICD-10-CM | POA: Diagnosis not present

## 2019-10-22 DIAGNOSIS — Z79899 Other long term (current) drug therapy: Secondary | ICD-10-CM | POA: Diagnosis not present

## 2019-10-22 DIAGNOSIS — E785 Hyperlipidemia, unspecified: Secondary | ICD-10-CM | POA: Insufficient documentation

## 2019-10-22 HISTORY — PX: ESOPHAGOGASTRODUODENOSCOPY (EGD) WITH PROPOFOL: SHX5813

## 2019-10-22 HISTORY — PX: COLONOSCOPY WITH PROPOFOL: SHX5780

## 2019-10-22 LAB — GLUCOSE, CAPILLARY: Glucose-Capillary: 76 mg/dL (ref 70–99)

## 2019-10-22 SURGERY — COLONOSCOPY WITH PROPOFOL
Anesthesia: General

## 2019-10-22 MED ORDER — PHENYLEPHRINE HCL (PRESSORS) 10 MG/ML IV SOLN
INTRAVENOUS | Status: DC | PRN
  Administered 2019-10-22 (×2): 50 ug via INTRAVENOUS
  Administered 2019-10-22: 100 ug via INTRAVENOUS

## 2019-10-22 MED ORDER — LIDOCAINE HCL (CARDIAC) PF 100 MG/5ML IV SOSY
PREFILLED_SYRINGE | INTRAVENOUS | Status: DC | PRN
  Administered 2019-10-22: 60 mg via INTRAVENOUS

## 2019-10-22 MED ORDER — PROPOFOL 10 MG/ML IV BOLUS
INTRAVENOUS | Status: AC
  Filled 2019-10-22: qty 40

## 2019-10-22 MED ORDER — PROPOFOL 500 MG/50ML IV EMUL
INTRAVENOUS | Status: DC | PRN
  Administered 2019-10-22 (×2): 50 mg via INTRAVENOUS
  Administered 2019-10-22: 75 ug/kg/min via INTRAVENOUS

## 2019-10-22 MED ORDER — SODIUM CHLORIDE 0.9 % IV SOLN
INTRAVENOUS | Status: DC
  Administered 2019-10-22: 1000 mL via INTRAVENOUS

## 2019-10-22 NOTE — Op Note (Addendum)
[facility] Patient Name: Marissa [name] Procedure Date: 10/22/2019 11:24 AM MRN: [id number] Account #: [id number] Date of Birth: 07/14/1976 Admit Type: Outpatient Age: 44 Room: [facility] ENDO ROOM 3 Gender: Female Note Status: Supervisor Override Procedure:             Upper GI endoscopy Indications:           Epigastric abdominal pain, Dysphagia, Unexplained iron                         deficiency anemia Providers:             Benay Pike. Alice Reichert MD, MD Referring MD:          [name] (Referring MD) Complications:         No immediate complications. Estimated blood loss: None. Procedure:             Pre-Anesthesia Assessment:                        - The risks and benefits of the procedure and the                         sedation options and risks were discussed with the                         patient. All questions were answered and informed                         consent was obtained.                        - Patient identification and proposed procedure were                         verified prior to the procedure by the nurse. The                         procedure was verified in the procedure room.                        - ASA Grade Assessment: III - A patient with severe                         systemic disease.                        - After reviewing the risks and benefits, the patient                         was deemed in satisfactory condition to undergo the                         procedure.                        After obtaining informed consent, the endoscope was                         passed under direct vision. Throughout the procedure,  the patient's blood pressure, pulse, and oxygen                         saturations were monitored continuously. The Endoscope                         was introduced through the mouth, and advanced to the                         third part of duodenum. The upper GI  endoscopy was                         accomplished without difficulty. The patient tolerated                         the procedure well. Findings:      No endoscopic abnormality was evident in the esophagus to explain the       patient's complaint of dysphagia. It was decided, however, to proceed       with dilation in the distal esophagus. The scope was withdrawn. Dilation       was performed with a Maloney dilator with no resistance at 62 Fr.      Evidence of a sleeve gastrectomy was found in the entire examined       stomach. This was characterized by healthy appearing mucosa. Estimated       blood loss: none.      The examined duodenum was normal.      The exam was otherwise without abnormality. Impression:            - No endoscopic esophageal abnormality to explain                         patient's dysphagia. Esophagus dilated. Dilated.                        - A sleeve gastrectomy was found, characterized by                         healthy appearing mucosa.                        - Normal examined duodenum.                        - The examination was otherwise normal.                        - No specimens collected. Recommendation:        - Monitor results to esophageal dilation                        - Proceed with colonoscopy Procedure Code(s):     --- Professional ---                        [phone], Esophagogastroduodenoscopy, flexible,                         transoral; diagnostic, including collection of  specimen(s) by brushing or washing, when performed                         (separate procedure)                        43450, Dilation of esophagus, by unguided sound or                         bougie, single or multiple passes Diagnosis Code(s):     --- Professional ---                        R10.13, Epigastric pain                        Z98.84, Bariatric surgery status                        R13.10, Dysphagia, unspecified CPT copyright 2019  American Medical Association. All rights reserved. The codes documented in this report are preliminary and upon coder review may  be revised to meet current compliance requirements. Efrain Sella MD, MD 10/22/2019 11:51:13 AM This report has been signed electronically. Number of Addenda: 0 Note Initiated On: 10/22/2019 11:24 AM Estimated Blood Loss:  Estimated blood loss: none.      [facility]

## 2019-10-22 NOTE — Interval H&P Note (Signed)
History and Physical Interval Note:  10/22/2019 11:30 AM  Marissa [name]  has presented today for surgery, with the diagnosis of IDA.  The various methods of treatment have been discussed with the patient and family. After consideration of risks, benefits and other options for treatment, the patient has consented to  Procedure(s) with comments: COLONOSCOPY WITH PROPOFOL (N/A) - KC DID RAPID TEST; COPY ON CHART ESOPHAGOGASTRODUODENOSCOPY (EGD) WITH PROPOFOL (N/A) as a surgical intervention.  The patient's history has been reviewed, patient examined, no change in status, stable for surgery.  I have reviewed the patient's chart and labs.  Questions were answered to the patient's satisfaction.     [place], [place]

## 2019-10-22 NOTE — Op Note (Signed)
[facility] Patient Name: Marissa [name] Procedure Date: 10/22/2019 11:26 AM MRN: [id number] Account #: [id number] Date of Birth: 05-09-1976 Admit Type: Outpatient Age: 44 Room: [facility] ENDO ROOM 3 Gender: Female Note Status: Finalized Procedure:             Colonoscopy Indications:           Unexplained iron deficiency anemia Providers:             Benay Pike. Alice Reichert MD, MD Referring MD:          [name] (Referring MD) Medicines:             Propofol per Anesthesia Complications:         No immediate complications. Procedure:             Pre-Anesthesia Assessment:                        - The risks and benefits of the procedure and the                         sedation options and risks were discussed with the                         patient. All questions were answered and informed                         consent was obtained.                        - Patient identification and proposed procedure were                         verified prior to the procedure by the nurse.                        - ASA Grade Assessment: III - A patient with severe                         systemic disease.                        - After reviewing the risks and benefits, the patient                         was deemed in satisfactory condition to undergo the                         procedure.                        After obtaining informed consent, the colonoscope was                         passed under direct vision. Throughout the procedure,                         the patient's blood pressure, pulse, and oxygen                         saturations were monitored continuously. The  Colonoscope was introduced through the anus and                         advanced to the the cecum, identified by appendiceal                         orifice and ileocecal valve. The colonoscopy was                         performed without difficulty. The  patient tolerated                         the procedure well. The quality of the bowel                         preparation was adequate. The ileocecal valve,                         appendiceal orifice, and rectum were photographed. Findings:      The perianal and digital rectal examinations were normal. Pertinent       negatives include normal sphincter tone and no palpable rectal lesions.      Many small and large-mouthed diverticula were found in the entire colon.       There was no evidence of diverticular bleeding.      Non-bleeding internal hemorrhoids were found during retroflexion. The       hemorrhoids were Grade I (internal hemorrhoids that do not prolapse).      The exam was otherwise without abnormality. Impression:            - Mild diverticulosis in the entire examined colon.                         There was no evidence of diverticular bleeding.                        - Non-bleeding internal hemorrhoids.                        - The examination was otherwise normal.                        - No specimens collected. Recommendation:        - Monitor results to esophageal dilation                        - Patient has a contact number available for                         emergencies. The signs and symptoms of potential                         delayed complications were discussed with the patient.                         Return to normal activities tomorrow. Written                         discharge instructions were provided to the patient.                        -  Resume previous diet.                        - Continue present medications.                        - To visualize the small bowel, perform video capsule                         endoscopy at appointment to be scheduled.                        - Repeat colonoscopy in 10 years for screening                         purposes.                        - Return to physician assistant in 3 months.                        -  Follow up with Octavia Bruckner, PA-C in [ ]  months. Procedure Code(s):     --- Professional ---                        [phone], Colonoscopy, flexible; diagnostic, including                         collection of specimen(s) by brushing or washing, when                         performed (separate procedure) Diagnosis Code(s):     --- Professional ---                        K57.30, Diverticulosis of large intestine without                         perforation or abscess without bleeding                        D50.9, Iron deficiency anemia, unspecified                        K64.0, First degree hemorrhoids CPT copyright 2019 American Medical Association. All rights reserved. The codes documented in this report are preliminary and upon coder review may  be revised to meet current compliance requirements. Efrain Sella MD, MD 10/22/2019 12:09:02 PM This report has been signed electronically. Number of Addenda: 0 Note Initiated On: 10/22/2019 11:26 AM Scope Withdrawal Time: 0 hours 7 minutes 51 seconds  Total Procedure Duration: 0 hours 13 minutes 21 seconds  Estimated Blood Loss:  Estimated blood loss: none.      [facility]

## 2019-10-22 NOTE — Anesthesia Postprocedure Evaluation (Signed)
Anesthesia Post Note  Patient: MALICIA PELTS  Procedure(s) Performed: COLONOSCOPY WITH PROPOFOL (N/A ) ESOPHAGOGASTRODUODENOSCOPY (EGD) WITH PROPOFOL (N/A )  Patient location during evaluation: Endoscopy Anesthesia Type: General Level of consciousness: awake and alert Pain management: pain level controlled Vital Signs Assessment: post-procedure vital signs reviewed and stable Respiratory status: spontaneous breathing, nonlabored ventilation, respiratory function stable and patient connected to nasal cannula oxygen Cardiovascular status: blood pressure returned to baseline and stable Postop Assessment: no apparent nausea or vomiting Anesthetic complications: no     Last Vitals:  Vitals:   10/22/19 1220 10/22/19 1240  BP:  119/88  Pulse: 92   Resp:    Temp:    SpO2: 100%     Last Pain:  Vitals:   10/22/19 1240  TempSrc:   PainSc: 0-No pain                 Arita Miss

## 2019-10-22 NOTE — Interval H&P Note (Signed)
History and Physical Interval Note:  10/22/2019 11:30 AM  Marissa [name]  has presented today for surgery, with the diagnosis of IDA.  The various methods of treatment have been discussed with the patient and family. After consideration of risks, benefits and other options for treatment, the patient has consented to  Procedure(s) with comments: COLONOSCOPY WITH PROPOFOL (N/A) - KC DID RAPID TEST; COPY ON CHART ESOPHAGOGASTRODUODENOSCOPY (EGD) WITH PROPOFOL (N/A) as a surgical intervention.  The patient's history has been reviewed, patient examined, no change in status, stable for surgery.  I have reviewed the patient's chart and labs.  Questions were answered to the patient's satisfaction.     [place], [place]

## 2019-10-22 NOTE — Transfer of Care (Signed)
Immediate Anesthesia Transfer of Care Note  Patient: JODELLE MCQUAIDE  Procedure(s) Performed: COLONOSCOPY WITH PROPOFOL (N/A ) ESOPHAGOGASTRODUODENOSCOPY (EGD) WITH PROPOFOL (N/A )  Patient Location: PACU  Anesthesia Type:General  Level of Consciousness: awake, alert  and oriented  Airway & Oxygen Therapy: Patient Spontanous Breathing  Post-op Assessment: Report given to RN  Post vital signs: Reviewed  Last Vitals:  Vitals Value Taken Time  BP    Temp    Pulse 88 10/22/19 1211  Resp 9 10/22/19 1211  SpO2 100 % 10/22/19 1211  Vitals shown include unvalidated device data.  Last Pain:  Vitals:   10/22/19 1103  TempSrc: Temporal  PainSc: 7          Complications: No apparent anesthesia complications

## 2019-10-22 NOTE — H&P (Signed)
Outpatient short stay form Pre-procedure 10/22/2019 11:26 AM Marissa [name] K. Alice Reichert, M.D.  Primary Physician: [name], M.D.  Reason for visit:  Iron deficiency anemia, Epigastric pain, solid food dysphagia  History of present illness: As above. Patient denies change in bowel habits, rectal bleeding, weight loss or abdominal pain.  Has intermittent solid food dysphagia to the level of the xiphoid process.     Current Facility-Administered Medications:  .  0.9 %  sodium chloride infusion, , Intravenous, Continuous, Batya Citron, Benay Pike, MD  Medications Prior to Admission  Medication Sig Dispense Refill Last Dose  . acetaminophen (TYLENOL) 500 MG tablet Take 1,000 mg by mouth 2 (two) times daily as needed for moderate pain or headache.   Past Week at Unknown time  . amitriptyline (ELAVIL) 50 MG tablet Take 50 mg by mouth at bedtime.   Past Week at Unknown time  . atorvastatin (LIPITOR) 80 MG tablet Take 80 mg by mouth at bedtime.   Past Week at Unknown time  . BIOTIN PO Take 1 tablet by mouth daily.   Past Week at Unknown time  . cyanocobalamin (,VITAMIN B-12,) 1000 MCG/ML injection Inject 1,000 mcg into the muscle every 30 (thirty) days.  3 Past Week at Unknown time  . ergocalciferol (VITAMIN D2) 1.25 MG (50000 UT) capsule Take 50,000 Units by mouth 2 (two) times a week.   Past Week at Unknown time  . gabapentin (NEURONTIN) 300 MG capsule Take 600-1,200 mg by mouth See admin instructions. Take 600 mg in the morning and afternoon, then take 1200 mg at bedtime   Past Week at Unknown time  . insulin aspart (NOVOLOG FLEXPEN) 100 UNIT/ML FlexPen Inject 10-20 Units into the skin 3 (three) times daily with meals. Sliding scale 15 mL 11 Past Week at Unknown time  . levocetirizine (XYZAL) 5 MG tablet Take 5 mg by mouth daily as needed for allergies.   Past Week at Unknown time  . meclizine (ANTIVERT) 25 MG tablet Take 1 tablet (25 mg total) by mouth 3 (three) times daily as needed for dizziness. 30  tablet 0 Past Week at Unknown time  . midodrine (PROAMATINE) 2.5 MG tablet Take by mouth.   Past Week at Unknown time  . Multiple Vitamin (MULTIVITAMIN) tablet Take 1 tablet by mouth daily.   Past Week at Unknown time  . nitroGLYCERIN (NITROSTAT) 0.4 MG SL tablet Place 0.4 mg under the tongue every 5 (five) minutes as needed for chest pain.   Past Week at Unknown time  . oxycodone (ROXICODONE) 30 MG immediate release tablet Take 15 mg by mouth every 4 (four) hours as needed for pain.   Past Week at Unknown time  . OZEMPIC, 0.25 OR 0.5 MG/DOSE, 2 MG/1.5ML SOPN INJECT 0.375 MLS (0.5 MG TOTAL) SUBCUTANEOUSLY ONCE A WEEK FOR 30 DAYS   Past Week at Unknown time  . pantoprazole (PROTONIX) 40 MG tablet Take by mouth.   Past Week at Unknown time  . sitaGLIPtin (JANUVIA) 100 MG tablet Take 100 mg by mouth daily.   Past Week at Unknown time  . sucralfate (CARAFATE) 1 GM/10ML suspension Take by mouth.   Past Week at Unknown time  . tiZANidine (ZANAFLEX) 4 MG tablet Limit 1 tab by mouth per day or 2-3 times per day if tolerated (Patient taking differently: Take 4 mg by mouth 3 (three) times daily. ) 90 tablet 0 Past Week at Unknown time  . topiramate (TOPAMAX) 50 MG tablet Take 50-100 mg by mouth See admin instructions. Take  50 mg in the morning and 100 mg at night   Past Week at Unknown time  . traZODone (DESYREL) 50 MG tablet Take 50 mg by mouth at bedtime.   1 Past Week at Unknown time  . NARCAN 4 MG/0.1ML LIQD nasal spray kit Place 1 spray into the nose once. If no response after 2 minutes repeat in opposite nostril.  0 Not Taking at Unknown time     Allergies  Allergen Reactions  . Bactrim [Sulfamethoxazole-Trimethoprim] Nausea And Vomiting  . Ciprofloxacin Swelling  . Vancomycin Nausea And Vomiting  . Amoxicillin Nausea And Vomiting    Patient tolerated Zosyn during the whole admission  Has patient had a PCN reaction causing immediate rash, facial/tongue/throat swelling, SOB or lightheadedness with  hypotension: No Has patient had a PCN reaction causing severe rash involving mucus membranes or skin necrosis: No Has patient had a PCN reaction that required hospitalization: No Has patient had a PCN reaction occurring within the last 10 years: Yes If all of the above answers are "NO", then may proceed with Cephalosporin use.   . Erythromycin Itching  . Nsaids     Avoids due to gastric bypass  . Azithromycin Itching and Rash  . Ceftriaxone Nausea And Vomiting  . Food Itching and Rash    "Greek yogurt"     Past Medical History:  Diagnosis Date  . Allergy   . Anemia   . Anxiety   . Arthritis   . Back pain, chronic   . Cardiomyopathy, dilated, nonischemic ([place])   . CHF (congestive heart failure) ([place])   . Cholelithiasis   . Coronary artery disease   . Depression   . Diabetes mellitus without complication (HCC)    fasting cbg 50-140s  . Dysrhythmia    tachycardia  . GERD (gastroesophageal reflux disease)    otc meds  . Headache(784.0)   . Hypercholesteremia   . Hyperlipemia   . Hypertension   . IDA (iron deficiency anemia) 09/29/2019  . Insulin pump in place    pt had insulin pump but it is now removed (02-18-16)  . MI, old    2006  . Migraines    once/month maybe - can last up to two weeks  . Myocardial infarction [facility]) 2006   "due to medication"; no evidence of ischemia or infarction by nuclear stress test '11  . Neck pain, chronic   . Neuropathy    legs  . Pneumonia 2015   [facility]  . Prolonged QT interval syndrome   . Renal insufficiency   . Restless leg syndrome, controlled   . Sepsis ([place]) 2016   secondary to surgery  . Sleep apnea    does not use cpap since losing alot of weight  . Vertigo    nop episodes 2020  . Vision loss    due to diabetes  . Wears contact lenses   . Wears dentures    partial lower    Review of systems:  Otherwise negative.    Physical Exam  Gen: Alert, oriented. Appears stated age.  HEENT: [place]/AT. PERRLA. Lungs: CTA, no  wheezes. CV: RR nl S1, S2. Abd: soft, benign, no masses. BS+ Ext: No edema. Pulses 2+    Planned procedures: Proceed with EGD and colonoscopy. The patient understands the nature of the planned procedure, indications, risks, alternatives and potential complications including but not limited to bleeding, infection, perforation, damage to internal organs and possible oversedation/side effects from anesthesia. The patient agrees and gives consent to proceed.  Please refer to procedure notes for findings, recommendations and patient disposition/instructions.     Taleshia Luff K. Alice Reichert, M.D. Gastroenterology 10/22/2019  11:26 AM

## 2019-10-22 NOTE — Anesthesia Preprocedure Evaluation (Signed)
Anesthesia Evaluation  Patient identified by MRN, date of birth, ID band Patient awake    Reviewed: Allergy & Precautions, NPO status , Patient's Chart, lab work & pertinent test results  History of Anesthesia Complications Negative for: history of anesthetic complications  Airway Mallampati: II       Dental  (+) Partial Lower   Pulmonary sleep apnea (resolved with 150lb weight loss) , neg COPD, Not current smoker,           Cardiovascular hypertension (off meds after weight loss), + Past MI and +CHF (with sepsis)  (-) dysrhythmias (-) Valvular Problems/Murmurs     Neuro/Psych neg Seizures Anxiety Depression    GI/Hepatic Neg liver ROS, GERD  Medicated and Controlled,  Endo/Other  diabetes, Type 2  Renal/GU Renal InsufficiencyRenal disease     Musculoskeletal   Abdominal   Peds  Hematology   Anesthesia Other Findings   Reproductive/Obstetrics                             Anesthesia Physical Anesthesia Plan  ASA: III  Anesthesia Plan: General   Post-op Pain Management:    Induction: Intravenous  PONV Risk Score and Plan: 3 and Propofol infusion, TIVA and Treatment may vary due to age or medical condition  Airway Management Planned: Nasal Cannula  Additional Equipment:   Intra-op Plan:   Post-operative Plan:   Informed Consent: I have reviewed the patients History and Physical, chart, labs and discussed the procedure including the risks, benefits and alternatives for the proposed anesthesia with the patient or authorized representative who has indicated his/her understanding and acceptance.       Plan Discussed with:   Anesthesia Plan Comments:         Anesthesia Quick Evaluation

## 2019-10-23 ENCOUNTER — Encounter: Payer: Self-pay | Admitting: *Deleted

## 2019-11-24 ENCOUNTER — Other Ambulatory Visit: Payer: Self-pay

## 2019-11-24 ENCOUNTER — Inpatient Hospital Stay: Payer: Medicare HMO | Attending: Oncology

## 2019-11-24 DIAGNOSIS — Z8249 Family history of ischemic heart disease and other diseases of the circulatory system: Secondary | ICD-10-CM | POA: Diagnosis not present

## 2019-11-24 DIAGNOSIS — Z79899 Other long term (current) drug therapy: Secondary | ICD-10-CM | POA: Diagnosis not present

## 2019-11-24 DIAGNOSIS — D508 Other iron deficiency anemias: Secondary | ICD-10-CM

## 2019-11-24 DIAGNOSIS — R1013 Epigastric pain: Secondary | ICD-10-CM | POA: Insufficient documentation

## 2019-11-24 DIAGNOSIS — I252 Old myocardial infarction: Secondary | ICD-10-CM | POA: Insufficient documentation

## 2019-11-24 DIAGNOSIS — R42 Dizziness and giddiness: Secondary | ICD-10-CM | POA: Diagnosis not present

## 2019-11-24 DIAGNOSIS — K56609 Unspecified intestinal obstruction, unspecified as to partial versus complete obstruction: Secondary | ICD-10-CM | POA: Diagnosis not present

## 2019-11-24 DIAGNOSIS — D509 Iron deficiency anemia, unspecified: Secondary | ICD-10-CM | POA: Insufficient documentation

## 2019-11-24 DIAGNOSIS — Z818 Family history of other mental and behavioral disorders: Secondary | ICD-10-CM | POA: Insufficient documentation

## 2019-11-24 DIAGNOSIS — Z814 Family history of other substance abuse and dependence: Secondary | ICD-10-CM | POA: Diagnosis not present

## 2019-11-24 DIAGNOSIS — Z801 Family history of malignant neoplasm of trachea, bronchus and lung: Secondary | ICD-10-CM | POA: Insufficient documentation

## 2019-11-24 DIAGNOSIS — R5383 Other fatigue: Secondary | ICD-10-CM | POA: Insufficient documentation

## 2019-11-24 DIAGNOSIS — Z803 Family history of malignant neoplasm of breast: Secondary | ICD-10-CM | POA: Diagnosis not present

## 2019-11-24 DIAGNOSIS — K922 Gastrointestinal hemorrhage, unspecified: Secondary | ICD-10-CM | POA: Insufficient documentation

## 2019-11-24 DIAGNOSIS — Z83438 Family history of other disorder of lipoprotein metabolism and other lipidemia: Secondary | ICD-10-CM | POA: Diagnosis not present

## 2019-11-24 DIAGNOSIS — Z833 Family history of diabetes mellitus: Secondary | ICD-10-CM | POA: Diagnosis not present

## 2019-11-24 LAB — CBC WITH DIFFERENTIAL/PLATELET
Abs Immature Granulocytes: 0.03 10*3/uL (ref 0.00–0.07)
Basophils Absolute: 0 10*3/uL (ref 0.0–0.1)
Basophils Relative: 1 %
Eosinophils Absolute: 0 10*3/uL (ref 0.0–0.5)
Eosinophils Relative: 1 %
HCT: 32.6 % — ABNORMAL LOW (ref 36.0–46.0)
Hemoglobin: 10.3 g/dL — ABNORMAL LOW (ref 12.0–15.0)
Immature Granulocytes: 1 %
Lymphocytes Relative: 40 %
Lymphs Abs: 1.6 10*3/uL (ref 0.7–4.0)
MCH: 27.5 pg (ref 26.0–34.0)
MCHC: 31.6 g/dL (ref 30.0–36.0)
MCV: 86.9 fL (ref 80.0–100.0)
Monocytes Absolute: 0.3 10*3/uL (ref 0.1–1.0)
Monocytes Relative: 8 %
Neutro Abs: 2 10*3/uL (ref 1.7–7.7)
Neutrophils Relative %: 49 %
Platelets: 164 10*3/uL (ref 150–400)
RBC: 3.75 MIL/uL — ABNORMAL LOW (ref 3.87–5.11)
RDW: 19 % — ABNORMAL HIGH (ref 11.5–15.5)
WBC: 3.9 10*3/uL — ABNORMAL LOW (ref 4.0–10.5)
nRBC: 0 % (ref 0.0–0.2)

## 2019-11-24 LAB — RETIC PANEL
Immature Retic Fract: 8.7 % (ref 2.3–15.9)
RBC.: 3.74 MIL/uL — ABNORMAL LOW (ref 3.87–5.11)
Retic Count, Absolute: 63.2 10*3/uL (ref 19.0–186.0)
Retic Ct Pct: 1.7 % (ref 0.4–3.1)
Reticulocyte Hemoglobin: 30.8 pg (ref >27.9)

## 2019-11-24 LAB — IRON AND TIBC
Iron: 49 ug/dL (ref 28–170)
Saturation Ratios: 14 % (ref 10.4–31.8)
TIBC: 363 ug/dL (ref 250–450)
UIBC: 314 ug/dL

## 2019-11-24 LAB — FERRITIN: Ferritin: 24 ng/mL (ref 11–307)

## 2019-11-25 ENCOUNTER — Inpatient Hospital Stay: Payer: Medicare HMO

## 2019-11-25 ENCOUNTER — Encounter: Payer: Self-pay | Admitting: Oncology

## 2019-11-25 ENCOUNTER — Inpatient Hospital Stay (HOSPITAL_BASED_OUTPATIENT_CLINIC_OR_DEPARTMENT_OTHER): Payer: Medicare HMO | Admitting: Oncology

## 2019-11-25 VITALS — BP 147/89 | HR 88 | Temp 97.4°F | Resp 16 | Wt 164.9 lb

## 2019-11-25 VITALS — BP 157/87 | HR 90 | Temp 98.0°F | Resp 18

## 2019-11-25 DIAGNOSIS — D509 Iron deficiency anemia, unspecified: Secondary | ICD-10-CM | POA: Diagnosis not present

## 2019-11-25 DIAGNOSIS — K922 Gastrointestinal hemorrhage, unspecified: Secondary | ICD-10-CM | POA: Diagnosis not present

## 2019-11-25 MED ORDER — IRON SUCROSE 20 MG/ML IV SOLN
200.0000 mg | Freq: Once | INTRAVENOUS | Status: AC
  Administered 2019-11-25: 200 mg via INTRAVENOUS
  Filled 2019-11-25: qty 10

## 2019-11-25 MED ORDER — SODIUM CHLORIDE 0.9 % IV SOLN
Freq: Once | INTRAVENOUS | Status: AC
  Filled 2019-11-25: qty 250

## 2019-11-25 MED ORDER — SODIUM CHLORIDE 0.9 % IV SOLN: 200.0000 mg | Freq: Once | INTRAVENOUS | Status: DC

## 2019-11-25 NOTE — Progress Notes (Signed)
Hematology/Oncology follow up  note [facility] [phone] [phone]) [phone]   Patient Care Team: [name], MD as PCP - General (Family Medicine) Theodis Sato, Gertha Calkin, MD as Consulting Physician (Cardiology) Kary Kos, MD as Consulting Physician (Neurosurgery) Earlie Server, MD as Consulting Physician (Hematology and Oncology)  REFERRING PROVIDER: [name], MD CHIEF COMPLAINTS/REASON FOR VISIT:  Follow up of iron deficiency anemia  HISTORY OF PRESENTING ILLNESS:  Marissa [name] is a  44 y.o.  female with PMH listed below was seen in consultation at the request of [name], MD  for evaluation of iron deficiency anemia.   Reviewed patient's recent labs  09/23/2019 labs revealed anemia with hemoglobin of 9.4, MCV 78.7.  Patient was referred to [facility] emergency room on 09/24/2019 for evaluation and IV iron infusion.  Patient reports approximately 4 weeks.  Of generalized fatigue, dizziness and feeling cold. Reviewed patient's previous labs ordered by primary care physician's office, anemia is chronic onset, since at least 2014. I will have any recent iron panel.  02/01/2024 iron saturation 10, ferritin 106. Patient received IV Feraheme and he tolerates well. 09/27/2019, patient went to [facility] ER for evaluation of epigastric pain.  She had CT abdomen pelvis done which showed no acute pathology within the abdomen or pelvis. Associated signs and symptoms: Patient reports fatigue, dizziness and feeling cold.  Denies SOB with exertion.  Denies weight loss, easy bruising, hematochezia, hemoptysis, hematuria. Context:  History of iron deficiency:  Rectal bleeding: Denies Menstrual bleeding/ Vaginal bleeding : LMP 12/21/2014, history of salpingo-oophorectomy bilateral. Hematemesis or hemoptysis : denies Blood in urine : denies  Last endoscopy: Patient had a history of laparoscopic gastric sleeve   INTERVAL HISTORY Marissa [name] is a 44  y.o. female who has above history reviewed by me today presents for follow up visit for management of iron deficiency anemia Problems and complaints are listed below: S/p IV Venofer treatments.  Fatigue is better.  She recently had capsule study. Results are not available to me. Per patient she was told that she has a "bleeding ulcer".   Review of Systems  Constitutional: Positive for fatigue. Negative for appetite change, chills and fever.  HENT:   Negative for hearing loss and voice change.   Eyes: Negative for eye problems.  Respiratory: Negative for chest tightness, cough and shortness of breath.   Cardiovascular: Negative for chest pain.  Gastrointestinal: Negative for abdominal distention, abdominal pain and blood in stool.  Endocrine: Negative for hot flashes.  Genitourinary: Negative for difficulty urinating and frequency.   Musculoskeletal: Negative for arthralgias.  Skin: Negative for itching and rash.  Neurological: Negative for extremity weakness.  Hematological: Negative for adenopathy.  Psychiatric/Behavioral: Negative for confusion.    MEDICAL HISTORY:  Past Medical History:  Diagnosis Date  . Allergy   . Anemia   . Anxiety   . Arthritis   . Back pain, chronic   . Cardiomyopathy, dilated, nonischemic ([place])   . CHF (congestive heart failure) ([place])   . Cholelithiasis   . Coronary artery disease   . Depression   . Diabetes mellitus without complication (HCC)    fasting cbg 50-140s  . Dysrhythmia    tachycardia  . GERD (gastroesophageal reflux disease)    otc meds  . Headache(784.0)   . Hypercholesteremia   . Hyperlipemia   . Hypertension   . IDA (iron deficiency anemia) 09/29/2019  . Insulin pump in place    pt had insulin pump but it is now removed (  02-18-16)  . MI, old    2006  . Migraines    once/month maybe - can last up to two weeks  . Myocardial infarction [facility]) 2006   "due to medication"; no evidence of ischemia or infarction by nuclear stress test  '11  . Neck pain, chronic   . Neuropathy    legs  . Pneumonia 2015   [facility]  . Prolonged QT interval syndrome   . Renal insufficiency   . Restless leg syndrome, controlled   . Sepsis ([place]) 2016   secondary to surgery  . Sleep apnea    does not use cpap since losing alot of weight  . Vertigo    nop episodes 2020  . Vision loss    due to diabetes  . Wears contact lenses   . Wears dentures    partial lower    SURGICAL HISTORY: Past Surgical History:  Procedure Laterality Date  . ACHILLES TENDON SURGERY Right 04/22/2019   Procedure: [facility] PROCEDURE WITH SUTURE ANCHOR;  Surgeon: Caroline More, DPM;  Location: [place];  Service: Podiatry;  Laterality: Right;  . APPENDECTOMY N/A 03/08/2018   Procedure: APPENDECTOMY;  Surgeon: Benjaman Kindler, MD;  Location: [facility] ORS;  Service: Gynecology;  Laterality: N/A;  By Dr. Windell Moment  . BACK SURGERY  2011   Lumbar   . BOWEL RESECTION N/A 03/08/2018   Procedure: SMALL BOWEL RESECTION;  Surgeon: Benjaman Kindler, MD;  Location: [facility] ORS;  Service: Gynecology;  Laterality: N/A;  . CARDIAC CATHETERIZATION  07/18/2004   50-60% mid LAD, minor luminal irregularities RCA, normal LM and CX, EF 50-55% [facility])  . carpel tunnel Bilateral   . CERVICAL FUSION  2006  . CHOLECYSTECTOMY N/A 04/08/2015   Procedure: LAPAROSCOPIC CHOLECYSTECTOMY WITH INTRAOPERATIVE CHOLANGIOGRAM;  Surgeon: Florene Glen, MD;  Location: [facility] ORS;  Service: General;  Laterality: N/A;  . COLON RESECTION N/A 03/16/2018   Procedure: COLON RESECTION;  Surgeon: Florene Glen, MD;  Location: [facility]S;  Service: General;  Laterality: N/A;  . COLONOSCOPY WITH PROPOFOL N/A 10/22/2019   Procedure: COLONOSCOPY WITH PROPOFOL;  Surgeon: Toledo, Benay Pike, MD;  Location: [facility] ENDOSCOPY;  Service: Gastroenterology;  Laterality: N/A;  [place] DID RAPID TEST; COPY ON CHART  . ESOPHAGOGASTRODUODENOSCOPY (EGD) WITH PROPOFOL N/A 10/22/2019   Procedure:  ESOPHAGOGASTRODUODENOSCOPY (EGD) WITH PROPOFOL;  Surgeon: Toledo, Benay Pike, MD;  Location: [facility] ENDOSCOPY;  Service: Gastroenterology;  Laterality: N/A;  . HAMMER TOE SURGERY Right 04/22/2019   Procedure: HAMMER TOE CORRECTION X 4;  Surgeon: Caroline More, DPM;  Location: [place];  Service: Podiatry;  Laterality: Right;  Diabetic - insulin and oral meds  . LAPAROSCOPIC GASTRECTOMY  05/31/2017   [facility], Dr. Darnell Level  . LUMBAR LAMINECTOMY/DECOMPRESSION MICRODISCECTOMY Right 05/07/2013   Procedure: Right Lumbar one-two laminectomy;  Surgeon: Elaina Hoops, MD;  Location: [place] NEURO ORS;  Service: Neurosurgery;  Laterality: Right;  . LUMBAR LAMINECTOMY/DECOMPRESSION MICRODISCECTOMY Left 10/29/2013   Procedure: Left Lumbar five-Sacral one Laminectomy;  Surgeon: Elaina Hoops, MD;  Location: [place] NEURO ORS;  Service: Neurosurgery;  Laterality: Left;  . LUMBAR WOUND DEBRIDEMENT N/A 01/25/2014   Procedure: LUMBAR WOUND DEBRIDEMENT;  Surgeon: Charlie Pitter, MD;  Location: [place] NEURO ORS;  Service: Neurosurgery;  Laterality: N/A;  . LUMBAR WOUND DEBRIDEMENT N/A 02/25/2014   Procedure: LUMBAR WOUND DEBRIDEMENT;  Surgeon: Elaina Hoops, MD;  Location: [place] NEURO ORS;  Service: Neurosurgery;  Laterality: N/A;  . LYSIS OF ADHESION N/A 03/08/2018   Procedure: LYSIS OF  ADHESION;  Surgeon: Benjaman Kindler, MD;  Location: [facility];  Service: Gynecology;  Laterality: N/A;  . SALPINGOOPHORECTOMY Bilateral    1 1997. 2nd 2001  . SHOULDER ARTHROSCOPY WITH BICEPSTENOTOMY Left 03/24/2016   Procedure: shoulder arthroscopy with biceps TENOTOMY, removal loose body, limited synovectomy;  Surgeon: Leanor Kail, MD;  Location: [facility] ORS;  Service: Orthopedics;  Laterality: Left;  . shoulder sugery     7/17 rotator cuff  . SMALL BOWEL REPAIR N/A 03/08/2018   Procedure: SMALL BOWEL REPAIR;  Surgeon: Benjaman Kindler, MD;  Location: [facility];  Service: Gynecology;  Laterality: N/A;    SOCIAL HISTORY: Social History    Socioeconomic History  . Marital status: Married    Spouse name: Not on file  . Number of children: 0  . Years of education: College  . Highest education level: Not on file  Occupational History  . Occupation: Glass blower/designer  Tobacco Use  . Smoking status: Never Smoker  . Smokeless tobacco: Never Used  Substance and Sexual Activity  . Alcohol use: No  . Drug use: No  . Sexual activity: Yes    Birth control/protection: Surgical  Other Topics Concern  . Not on file  Social History Narrative   Lives at home with her fiance.   Right-handed.   Occasional caffeine use.   Social Determinants of Health   Financial Resource Strain:   . Difficulty of Paying Living Expenses:   Food Insecurity:   . Worried About Charity fundraiser in the Last Year:   . Arboriculturist in the Last Year:   Transportation Needs:   . Film/video editor (Medical):   Marland Kitchen Lack of Transportation (Non-Medical):   Physical Activity:   . Days of Exercise per Week:   . Minutes of Exercise per Session:   Stress:   . Feeling of Stress :   Social Connections:   . Frequency of Communication with Friends and Family:   . Frequency of Social Gatherings with Friends and Family:   . Attends Religious Services:   . Active Member of Clubs or Organizations:   . Attends Archivist Meetings:   Marland Kitchen Marital Status:   Intimate Partner Violence:   . Fear of Current or Ex-Partner:   . Emotionally Abused:   Marland Kitchen Physically Abused:   . Sexually Abused:     FAMILY HISTORY: Family History  Problem Relation Age of Onset  . Depression Mother   . Drug abuse Mother   . Early death Mother   . Hypertension Mother   . Varicose Veins Mother   . CVA Mother   . Diabetes Father   . Early death Father   . Hyperlipidemia Father   . Heart Problems Father        enlarged geart  . Breast cancer Maternal Grandmother        60's  . Breast cancer Maternal Aunt   . Lung cancer Maternal Grandfather     ALLERGIES:  is  allergic to bactrim [sulfamethoxazole-trimethoprim]; ciprofloxacin; vancomycin; amoxicillin; erythromycin; nsaids; azithromycin; ceftriaxone; and food.  MEDICATIONS:  Current Outpatient Medications  Medication Sig Dispense Refill  . acetaminophen (TYLENOL) 500 MG tablet Take 1,000 mg by mouth 2 (two) times daily as needed for moderate pain or headache.    Marland Kitchen amitriptyline (ELAVIL) 50 MG tablet Take 50 mg by mouth at bedtime.    Marland Kitchen atorvastatin (LIPITOR) 80 MG tablet Take 80 mg by mouth at bedtime.    Marland Kitchen BIOTIN PO Take 1  tablet by mouth daily.    . cyanocobalamin (,VITAMIN B-12,) 1000 MCG/ML injection Inject 1,000 mcg into the muscle every 30 (thirty) days.  3  . ergocalciferol (VITAMIN D2) 1.25 MG (50000 UT) capsule Take 50,000 Units by mouth 2 (two) times a week.    . gabapentin (NEURONTIN) 300 MG capsule Take 600-1,200 mg by mouth See admin instructions. Take 600 mg in the morning and afternoon, then take 1200 mg at bedtime    . insulin aspart (NOVOLOG FLEXPEN) 100 UNIT/ML FlexPen Inject 10-20 Units into the skin 3 (three) times daily with meals. Sliding scale 15 mL 11  . meclizine (ANTIVERT) 25 MG tablet Take 1 tablet (25 mg total) by mouth 3 (three) times daily as needed for dizziness. 30 tablet 0  . Multiple Vitamin (MULTIVITAMIN) tablet Take 1 tablet by mouth daily.    Marland Kitchen NARCAN 4 MG/0.1ML LIQD nasal spray kit Place 1 spray into the nose once. If no response after 2 minutes repeat in opposite nostril.  0  . nitroGLYCERIN (NITROSTAT) 0.4 MG SL tablet Place 0.4 mg under the tongue every 5 (five) minutes as needed for chest pain.    Marland Kitchen oxycodone (ROXICODONE) 30 MG immediate release tablet Take 15 mg by mouth every 4 (four) hours as needed for pain.    Marland Kitchen OZEMPIC, 0.25 OR 0.5 MG/DOSE, 2 MG/1.5ML SOPN INJECT 0.375 MLS (0.5 MG TOTAL) SUBCUTANEOUSLY ONCE A WEEK FOR 30 DAYS    . pantoprazole (PROTONIX) 40 MG tablet TAKE 1 TABLET (40 MG TOTAL) BY MOUTH 2 (TWO) TIMES DAILY BEFORE MEALS FOR 30 DAYS    .  sitaGLIPtin (JANUVIA) 100 MG tablet Take 100 mg by mouth daily.    Marland Kitchen tiZANidine (ZANAFLEX) 4 MG tablet Limit 1 tab by mouth per day or 2-3 times per day if tolerated (Patient taking differently: Take 4 mg by mouth 3 (three) times daily. ) 90 tablet 0  . midodrine (PROAMATINE) 2.5 MG tablet Take by mouth.    . ondansetron (ZOFRAN-ODT) 4 MG disintegrating tablet Take 4 mg by mouth every 8 (eight) hours as needed.    . sucralfate (CARAFATE) 1 GM/10ML suspension Take by mouth.    . sucralfate (CARAFATE) 1 GM/10ML suspension Take by mouth.    . traZODone (DESYREL) 50 MG tablet Take 50 mg by mouth at bedtime.   1   No current facility-administered medications for this visit.     PHYSICAL EXAMINATION: ECOG PERFORMANCE STATUS: 1 - Symptomatic but completely ambulatory Vitals:   11/25/19 1330  BP: (!) 147/89  Pulse: 88  Resp: 16  Temp: (!) 97.4 F (36.3 C)   Filed Weights   11/25/19 1330  Weight: 164 lb 14.4 oz (74.8 kg)    Physical Exam Constitutional:      General: She is not in acute distress. HENT:     Head: Normocephalic and atraumatic.  Eyes:     General: No scleral icterus. Cardiovascular:     Rate and Rhythm: Normal rate and regular rhythm.     Heart sounds: Normal heart sounds.  Pulmonary:     Effort: Pulmonary effort is normal. No respiratory distress.     Breath sounds: No wheezing.  Abdominal:     General: Bowel sounds are normal. There is no distension.     Palpations: Abdomen is soft.  Musculoskeletal:        General: No deformity. Normal range of motion.     Cervical back: Normal range of motion and neck supple.  Skin:  General: Skin is warm and dry.     Findings: No erythema or rash.  Neurological:     Mental Status: She is alert and oriented to person, place, and time. Mental status is at baseline.     Cranial Nerves: No cranial nerve deficit.     Coordination: Coordination normal.  Psychiatric:        Mood and Affect: Mood normal.       CMP  Latest Ref Rng & Units 09/29/2019  Glucose 70 - 99 mg/dL 95  BUN 6 - 20 mg/dL 10  Creatinine 0.44 - 1.00 mg/dL 0.86  Sodium 135 - 145 mmol/L 143  Potassium 3.5 - 5.1 mmol/L 4.2  Chloride 98 - 111 mmol/L 106  CO2 22 - 32 mmol/L 29  Calcium 8.9 - 10.3 mg/dL 9.3  Total Protein 6.5 - 8.1 g/dL 7.4  Total Bilirubin 0.3 - 1.2 mg/dL 0.5  Alkaline Phos 38 - 126 U/L 90  AST 15 - 41 U/L 31  ALT 0 - 44 U/L 19   CBC Latest Ref Rng & Units 11/24/2019  WBC 4.0 - 10.5 K/uL 3.9(L)  Hemoglobin 12.0 - 15.0 g/dL 10.3(L)  Hematocrit 36.0 - 46.0 % 32.6(L)  Platelets 150 - 400 K/uL 164     LABORATORY DATA:  I have reviewed the data as listed Lab Results  Component Value Date   WBC 3.9 (L) 11/24/2019   HGB 10.3 (L) 11/24/2019   HCT 32.6 (L) 11/24/2019   MCV 86.9 11/24/2019   PLT 164 11/24/2019   Recent Labs    09/23/19 2050 09/29/19 1050  NA 141 143  K 3.8 4.2  CL 107 106  CO2 23 29  GLUCOSE 189* 95  BUN 13 10  CREATININE 0.98 0.86  CALCIUM 9.2 9.3  GFRNONAA >60 >60  GFRAA >60 >60  PROT  --  7.4  ALBUMIN  --  3.8  AST  --  31  ALT  --  19  ALKPHOS  --  90  BILITOT  --  0.5   Iron/TIBC/Ferritin/ %Sat    Component Value Date/Time   IRON 49 11/24/2019 1515   TIBC 363 11/24/2019 1515   FERRITIN 24 11/24/2019 1515   IRONPCTSAT 14 11/24/2019 1515     No results found.    ASSESSMENT & PLAN:  1. Iron deficiency anemia, unspecified iron deficiency anemia type   2. Gastrointestinal hemorrhage, unspecified gastrointestinal hemorrhage type    #iron deficiency anemia.  Labs are reviewed and discussed with patient. Hemoglobin has improved to 10.3, ferritin level 24.  Recommend patient to proceed with another dose of IV Venofer today.  Awaiting official results from capsule study and GI management.   Addendum, I discussed with [facility] [employer]. Patient has small bowel stricture. Capsule study showed one ulcer with stigmata of bleeding and several lineal ulcerations.  Capsule is retained.  Due to the drop of hemoglobin, I suspect she has ongoing blood loss.  I will arrange patient to have repeat IV venofer in 4 weeks and follow up in 8 weeks.   Orders Placed This Encounter  Procedures  . Iron and TIBC    Standing Status:   Future    Standing Expiration Date:   05/26/2021  . Ferritin    Standing Status:   Future    Standing Expiration Date:   05/26/2021  . CBC with Differential/Platelet    Standing Status:   Future    Standing Expiration Date:   05/26/2021  . Retic  Panel    Standing Status:   Future    Standing Expiration Date:   05/26/2021    All questions were answered. The patient knows to call the clinic with any problems questions or concerns. Return of visit: 8 weeks      Earlie Server, MD, PhD Hematology Oncology [facility] at [facility] Pager- [phone] 11/25/2019

## 2019-11-25 NOTE — Progress Notes (Signed)
Patient reports bleeding ulcers seen on recent GI study.

## 2019-11-26 ENCOUNTER — Other Ambulatory Visit: Payer: Self-pay | Admitting: Gastroenterology

## 2019-11-26 DIAGNOSIS — K56699 Other intestinal obstruction unspecified as to partial versus complete obstruction: Secondary | ICD-10-CM

## 2019-11-26 DIAGNOSIS — K591 Functional diarrhea: Secondary | ICD-10-CM

## 2019-11-26 DIAGNOSIS — Z8719 Personal history of other diseases of the digestive system: Secondary | ICD-10-CM

## 2019-11-27 ENCOUNTER — Telehealth: Payer: Self-pay

## 2019-11-27 NOTE — Telephone Encounter (Signed)
error 

## 2019-11-28 ENCOUNTER — Telehealth: Payer: Self-pay

## 2019-11-28 NOTE — Telephone Encounter (Addendum)
Please schedule

## 2019-11-28 NOTE — Telephone Encounter (Signed)
Done... Pts appts has been scheduled as requested.. Pt is aware of her scheduled appts dates and times.

## 2019-11-28 NOTE — Telephone Encounter (Signed)
-----   Message from Earlie Server, MD sent at 11/27/2019 10:47 PM EDT ----- Please arrange her to have another IV venofer in 4 weeks.  Lab md venofer in 8 weeks. Labs prior. Thanks.

## 2019-12-11 ENCOUNTER — Ambulatory Visit: Admission: RE | Admit: 2019-12-11 | Payer: Medicare HMO | Source: Ambulatory Visit

## 2019-12-26 ENCOUNTER — Inpatient Hospital Stay: Payer: Medicare HMO | Attending: Oncology

## 2019-12-26 ENCOUNTER — Other Ambulatory Visit: Payer: Self-pay

## 2019-12-26 VITALS — BP 124/79 | HR 100 | Temp 97.0°F | Resp 18

## 2019-12-26 DIAGNOSIS — Z818 Family history of other mental and behavioral disorders: Secondary | ICD-10-CM | POA: Diagnosis not present

## 2019-12-26 DIAGNOSIS — Z803 Family history of malignant neoplasm of breast: Secondary | ICD-10-CM | POA: Insufficient documentation

## 2019-12-26 DIAGNOSIS — K9281 Gastrointestinal mucositis (ulcerative): Secondary | ICD-10-CM | POA: Insufficient documentation

## 2019-12-26 DIAGNOSIS — Z83438 Family history of other disorder of lipoprotein metabolism and other lipidemia: Secondary | ICD-10-CM | POA: Insufficient documentation

## 2019-12-26 DIAGNOSIS — D500 Iron deficiency anemia secondary to blood loss (chronic): Secondary | ICD-10-CM | POA: Insufficient documentation

## 2019-12-26 DIAGNOSIS — Z9884 Bariatric surgery status: Secondary | ICD-10-CM | POA: Insufficient documentation

## 2019-12-26 DIAGNOSIS — Z801 Family history of malignant neoplasm of trachea, bronchus and lung: Secondary | ICD-10-CM | POA: Insufficient documentation

## 2019-12-26 DIAGNOSIS — K912 Postsurgical malabsorption, not elsewhere classified: Secondary | ICD-10-CM | POA: Insufficient documentation

## 2019-12-26 DIAGNOSIS — Z8249 Family history of ischemic heart disease and other diseases of the circulatory system: Secondary | ICD-10-CM | POA: Diagnosis not present

## 2019-12-26 DIAGNOSIS — Z814 Family history of other substance abuse and dependence: Secondary | ICD-10-CM | POA: Diagnosis not present

## 2019-12-26 DIAGNOSIS — R5383 Other fatigue: Secondary | ICD-10-CM | POA: Diagnosis not present

## 2019-12-26 DIAGNOSIS — Z79899 Other long term (current) drug therapy: Secondary | ICD-10-CM | POA: Diagnosis not present

## 2019-12-26 DIAGNOSIS — D509 Iron deficiency anemia, unspecified: Secondary | ICD-10-CM

## 2019-12-26 MED ORDER — IRON SUCROSE 20 MG/ML IV SOLN
200.0000 mg | Freq: Once | INTRAVENOUS | Status: AC
  Administered 2019-12-26: 200 mg via INTRAVENOUS
  Filled 2019-12-26: qty 10

## 2019-12-26 MED ORDER — SODIUM CHLORIDE 0.9 % IV SOLN: 200.0000 mg | Freq: Once | INTRAVENOUS | Status: DC

## 2019-12-26 MED ORDER — SODIUM CHLORIDE 0.9 % IV SOLN
Freq: Once | INTRAVENOUS | Status: AC
  Filled 2019-12-26: qty 250

## 2020-01-26 ENCOUNTER — Inpatient Hospital Stay: Payer: Medicare HMO | Attending: Oncology

## 2020-01-26 ENCOUNTER — Other Ambulatory Visit: Payer: Self-pay

## 2020-01-26 DIAGNOSIS — D509 Iron deficiency anemia, unspecified: Secondary | ICD-10-CM

## 2020-01-26 DIAGNOSIS — K5731 Diverticulosis of large intestine without perforation or abscess with bleeding: Secondary | ICD-10-CM | POA: Insufficient documentation

## 2020-01-26 DIAGNOSIS — Z814 Family history of other substance abuse and dependence: Secondary | ICD-10-CM | POA: Insufficient documentation

## 2020-01-26 DIAGNOSIS — R5383 Other fatigue: Secondary | ICD-10-CM | POA: Insufficient documentation

## 2020-01-26 DIAGNOSIS — M1990 Unspecified osteoarthritis, unspecified site: Secondary | ICD-10-CM | POA: Diagnosis not present

## 2020-01-26 DIAGNOSIS — Z8349 Family history of other endocrine, nutritional and metabolic diseases: Secondary | ICD-10-CM | POA: Diagnosis not present

## 2020-01-26 DIAGNOSIS — Z880 Allergy status to penicillin: Secondary | ICD-10-CM | POA: Diagnosis not present

## 2020-01-26 DIAGNOSIS — Z833 Family history of diabetes mellitus: Secondary | ICD-10-CM | POA: Diagnosis not present

## 2020-01-26 DIAGNOSIS — Z90721 Acquired absence of ovaries, unilateral: Secondary | ICD-10-CM | POA: Diagnosis not present

## 2020-01-26 DIAGNOSIS — Z9884 Bariatric surgery status: Secondary | ICD-10-CM | POA: Diagnosis not present

## 2020-01-26 DIAGNOSIS — I2510 Atherosclerotic heart disease of native coronary artery without angina pectoris: Secondary | ICD-10-CM | POA: Diagnosis not present

## 2020-01-26 DIAGNOSIS — Z881 Allergy status to other antibiotic agents status: Secondary | ICD-10-CM | POA: Diagnosis not present

## 2020-01-26 DIAGNOSIS — D500 Iron deficiency anemia secondary to blood loss (chronic): Secondary | ICD-10-CM | POA: Diagnosis not present

## 2020-01-26 DIAGNOSIS — Z801 Family history of malignant neoplasm of trachea, bronchus and lung: Secondary | ICD-10-CM | POA: Insufficient documentation

## 2020-01-26 DIAGNOSIS — Z886 Allergy status to analgesic agent status: Secondary | ICD-10-CM | POA: Insufficient documentation

## 2020-01-26 DIAGNOSIS — Z8249 Family history of ischemic heart disease and other diseases of the circulatory system: Secondary | ICD-10-CM | POA: Insufficient documentation

## 2020-01-26 DIAGNOSIS — Z803 Family history of malignant neoplasm of breast: Secondary | ICD-10-CM | POA: Insufficient documentation

## 2020-01-26 DIAGNOSIS — I252 Old myocardial infarction: Secondary | ICD-10-CM | POA: Insufficient documentation

## 2020-01-26 DIAGNOSIS — Z818 Family history of other mental and behavioral disorders: Secondary | ICD-10-CM | POA: Diagnosis not present

## 2020-01-26 DIAGNOSIS — Z9049 Acquired absence of other specified parts of digestive tract: Secondary | ICD-10-CM | POA: Insufficient documentation

## 2020-01-26 DIAGNOSIS — Z79899 Other long term (current) drug therapy: Secondary | ICD-10-CM | POA: Diagnosis not present

## 2020-01-26 LAB — CBC WITH DIFFERENTIAL/PLATELET
Abs Immature Granulocytes: 0.04 10*3/uL (ref 0.00–0.07)
Basophils Absolute: 0 10*3/uL (ref 0.0–0.1)
Basophils Relative: 1 %
Eosinophils Absolute: 0 10*3/uL (ref 0.0–0.5)
Eosinophils Relative: 1 %
HCT: 34.6 % — ABNORMAL LOW (ref 36.0–46.0)
Hemoglobin: 11.1 g/dL — ABNORMAL LOW (ref 12.0–15.0)
Immature Granulocytes: 1 %
Lymphocytes Relative: 30 %
Lymphs Abs: 1.3 10*3/uL (ref 0.7–4.0)
MCH: 28.8 pg (ref 26.0–34.0)
MCHC: 32.1 g/dL (ref 30.0–36.0)
MCV: 89.9 fL (ref 80.0–100.0)
Monocytes Absolute: 0.3 10*3/uL (ref 0.1–1.0)
Monocytes Relative: 7 %
Neutro Abs: 2.7 10*3/uL (ref 1.7–7.7)
Neutrophils Relative %: 60 %
Platelets: 181 10*3/uL (ref 150–400)
RBC: 3.85 MIL/uL — ABNORMAL LOW (ref 3.87–5.11)
RDW: 14 % (ref 11.5–15.5)
WBC: 4.4 10*3/uL (ref 4.0–10.5)
nRBC: 0 % (ref 0.0–0.2)

## 2020-01-26 LAB — RETIC PANEL
Immature Retic Fract: 13.5 % (ref 2.3–15.9)
RBC.: 3.81 MIL/uL — ABNORMAL LOW (ref 3.87–5.11)
Retic Count, Absolute: 69.3 10*3/uL (ref 19.0–186.0)
Retic Ct Pct: 1.8 % (ref 0.4–3.1)
Reticulocyte Hemoglobin: 33.3 pg (ref >27.9)

## 2020-01-26 LAB — IRON AND TIBC
Iron: 61 ug/dL (ref 28–170)
Saturation Ratios: 15 % (ref 10.4–31.8)
TIBC: 399 ug/dL (ref 250–450)
UIBC: 338 ug/dL

## 2020-01-26 LAB — FERRITIN: Ferritin: 41 ng/mL (ref 11–307)

## 2020-01-28 ENCOUNTER — Inpatient Hospital Stay (HOSPITAL_BASED_OUTPATIENT_CLINIC_OR_DEPARTMENT_OTHER): Payer: Medicare HMO | Admitting: Oncology

## 2020-01-28 ENCOUNTER — Ambulatory Visit: Payer: Medicare HMO

## 2020-01-28 ENCOUNTER — Other Ambulatory Visit: Payer: Self-pay

## 2020-01-28 ENCOUNTER — Encounter: Payer: Self-pay | Admitting: Oncology

## 2020-01-28 DIAGNOSIS — D500 Iron deficiency anemia secondary to blood loss (chronic): Secondary | ICD-10-CM | POA: Diagnosis not present

## 2020-01-28 NOTE — Progress Notes (Signed)
HEMATOLOGY-ONCOLOGY TeleHEALTH VISIT PROGRESS NOTE  I connected with Marissa [name] on 01/28/20 at  1:45 PM EDT by video enabled telemedicine visit and verified that I am speaking with the correct person using two identifiers. I discussed the limitations, risks, security and privacy concerns of performing an evaluation and management service by telemedicine and the availability of in-person appointments. I also discussed with the patient that there may be a patient responsible charge related to this service. The patient expressed understanding and agreed to proceed.   Other persons participating in the visit and their role in the encounter:  None  Patient's location: Home  Provider's location: office Chief Complaint: iron deficiency anemia.    INTERVAL HISTORY Marissa [name] is a 44 y.o. female who has above history reviewed by me today presents for follow up visit for management of iron deficiency anemia.  Problems and complaints are listed below:  She was admitted in April 2021 at [facility] due to abdominal pain, AKI. and had enteroscopy done on 12/11/2019. No specimen was collected. The area of ulceration and stenosis could not be reached with this antegrade approach. 12/15/2019 enteroscopy examined portion of the ileum was normal, diverticulosis in the left colon.  01/07/2020 he was seen by Dr.Bruce-Bariatric Surgery.  Per patient there was plan of laparoscopic adhesion lysis procedure.  Today she feels some fatigued, energy level has not returned to normal yet.   Review of Systems  Constitutional: Positive for fatigue. Negative for appetite change, chills and fever.  HENT:   Negative for hearing loss and voice change.   Eyes: Negative for eye problems.  Respiratory: Negative for chest tightness and cough.   Cardiovascular: Negative for chest pain.  Gastrointestinal: Negative for abdominal distention, abdominal pain and blood in stool.  Endocrine: Negative for hot flashes.  Genitourinary:  Negative for difficulty urinating and frequency.   Musculoskeletal: Negative for arthralgias.  Skin: Negative for itching and rash.  Neurological: Negative for extremity weakness.  Hematological: Negative for adenopathy.  Psychiatric/Behavioral: Negative for confusion.    Past Medical History:  Diagnosis Date  . Allergy   . Anemia   . Anxiety   . Arthritis   . Back pain, chronic   . Cardiomyopathy, dilated, nonischemic ([place])   . CHF (congestive heart failure) ([place])   . Cholelithiasis   . Coronary artery disease   . Depression   . Diabetes mellitus without complication (HCC)    fasting cbg 50-140s  . Dysrhythmia    tachycardia  . GERD (gastroesophageal reflux disease)    otc meds  . Headache(784.0)   . Hypercholesteremia   . Hyperlipemia   . Hypertension   . IDA (iron deficiency anemia) 09/29/2019  . Insulin pump in place    pt had insulin pump but it is now removed (02-18-16)  . MI, old    2006  . Migraines    once/month maybe - can last up to two weeks  . Myocardial infarction [facility]) 2006   "due to medication"; no evidence of ischemia or infarction by nuclear stress test '11  . Neck pain, chronic   . Neuropathy    legs  . Pneumonia 2015   [facility]  . Prolonged QT interval syndrome   . Renal insufficiency   . Restless leg syndrome, controlled   . Sepsis ([place]) 2016   secondary to surgery  . Sleep apnea    does not use cpap since losing alot of weight  . Vertigo    nop episodes 2020  .  Vision loss    due to diabetes  . Wears contact lenses   . Wears dentures    partial lower   Past Surgical History:  Procedure Laterality Date  . ACHILLES TENDON SURGERY Right 04/22/2019   Procedure: [facility] PROCEDURE WITH SUTURE ANCHOR;  Surgeon: Caroline More, DPM;  Location: [place];  Service: Podiatry;  Laterality: Right;  . APPENDECTOMY N/A 03/08/2018   Procedure: APPENDECTOMY;  Surgeon: Benjaman Kindler, MD;  Location: [facility];  Service: Gynecology;  Laterality: N/A;   By Dr. Windell Moment  . BACK SURGERY  2011   Lumbar   . BOWEL RESECTION N/A 03/08/2018   Procedure: SMALL BOWEL RESECTION;  Surgeon: Benjaman Kindler, MD;  Location: [facility];  Service: Gynecology;  Laterality: N/A;  . CARDIAC CATHETERIZATION  07/18/2004   50-60% mid LAD, minor luminal irregularities RCA, normal LM and CX, EF 50-55% [facility])  . carpel tunnel Bilateral   . CERVICAL FUSION  2006  . CHOLECYSTECTOMY N/A 04/08/2015   Procedure: LAPAROSCOPIC CHOLECYSTECTOMY WITH INTRAOPERATIVE CHOLANGIOGRAM;  Surgeon: Florene Glen, MD;  Location: [facility];  Service: General;  Laterality: N/A;  . COLON RESECTION N/A 03/16/2018   Procedure: COLON RESECTION;  Surgeon: Florene Glen, MD;  Location: [facility];  Service: General;  Laterality: N/A;  . COLONOSCOPY WITH PROPOFOL N/A 10/22/2019   Procedure: COLONOSCOPY WITH PROPOFOL;  Surgeon: Toledo, Benay Pike, MD;  Location: [facility];  Service: Gastroenterology;  Laterality: N/A;  [place] DID RAPID TEST; COPY ON CHART  . ESOPHAGOGASTRODUODENOSCOPY (EGD) WITH PROPOFOL N/A 10/22/2019   Procedure: ESOPHAGOGASTRODUODENOSCOPY (EGD) WITH PROPOFOL;  Surgeon: Toledo, Benay Pike, MD;  Location: [facility];  Service: Gastroenterology;  Laterality: N/A;  . HAMMER TOE SURGERY Right 04/22/2019   Procedure: HAMMER TOE CORRECTION X 4;  Surgeon: Caroline More, DPM;  Location: [place];  Service: Podiatry;  Laterality: Right;  Diabetic - insulin and oral meds  . LAPAROSCOPIC GASTRECTOMY  05/31/2017   [facility], Dr. Darnell Level  . LUMBAR LAMINECTOMY/DECOMPRESSION MICRODISCECTOMY Right 05/07/2013   Procedure: Right Lumbar one-two laminectomy;  Surgeon: Elaina Hoops, MD;  Location: [place] [facility]URO ORS;  Service: Neurosurgery;  Laterality: Right;  . LUMBAR LAMINECTOMY/DECOMPRESSION MICRODISCECTOMY Left 10/29/2013   Procedure: Left Lumbar five-Sacral one Laminectomy;  Surgeon: Elaina Hoops, MD;  Location: [place] [facility]URO ORS;  Service: Neurosurgery;  Laterality: Left;  .  LUMBAR WOUND DEBRIDEMENT N/A 01/25/2014   Procedure: LUMBAR WOUND DEBRIDEMENT;  Surgeon: Charlie Pitter, MD;  Location: [place] NEURO ORS;  Service: Neurosurgery;  Laterality: N/A;  . LUMBAR WOUND DEBRIDEMENT N/A 02/25/2014   Procedure: LUMBAR WOUND DEBRIDEMENT;  Surgeon: Elaina Hoops, MD;  Location: [place] NEURO ORS;  Service: Neurosurgery;  Laterality: N/A;  . LYSIS OF ADHESION N/A 03/08/2018   Procedure: LYSIS OF ADHESION;  Surgeon: Benjaman Kindler, MD;  Location: [facility];  Service: Gynecology;  Laterality: N/A;  . SALPINGOOPHORECTOMY Bilateral    1 1997. 2nd 2001  . SHOULDER ARTHROSCOPY WITH BICEPSTENOTOMY Left 03/24/2016   Procedure: shoulder arthroscopy with biceps TENOTOMY, removal loose body, limited synovectomy;  Surgeon: Leanor Kail, MD;  Location: [facility];  Service: Orthopedics;  Laterality: Left;  . shoulder sugery     7/17 rotator cuff  . SMALL BOWEL REPAIR N/A 03/08/2018   Procedure: SMALL BOWEL REPAIR;  Surgeon: Benjaman Kindler, MD;  Location: [facility];  Service: Gynecology;  Laterality: N/A;    Family History  Problem Relation Age of Onset  . Depression Mother   . Drug abuse Mother   .  Early death Mother   . Hypertension Mother   . Varicose Veins Mother   . CVA Mother   . Diabetes Father   . Early death Father   . Hyperlipidemia Father   . Heart Problems Father        enlarged geart  . Breast cancer Maternal Grandmother        60's  . Breast cancer Maternal Aunt   . Lung cancer Maternal Grandfather     Social History   Socioeconomic History  . Marital status: Married    Spouse name: Not on file  . Number of children: 0  . Years of education: College  . Highest education level: Not on file  Occupational History  . Occupation: Glass blower/designer  Tobacco Use  . Smoking status: Never Smoker  . Smokeless tobacco: Never Used  Vaping Use  . Vaping Use: Never used  Substance and Sexual Activity  . Alcohol use: No  . Drug use: No  . Sexual activity: Yes    Birth  control/protection: Surgical  Other Topics Concern  . Not on file  Social History Narrative   Lives at home with her fiance.   Right-handed.   Occasional caffeine use.   Social Determinants of Health   Financial Resource Strain:   . Difficulty of Paying Living Expenses:   Food Insecurity:   . Worried About Charity fundraiser in the Last Year:   . Arboriculturist in the Last Year:   Transportation Needs:   . Film/video editor (Medical):   Marland Kitchen Lack of Transportation (Non-Medical):   Physical Activity:   . Days of Exercise per Week:   . Minutes of Exercise per Session:   Stress:   . Feeling of Stress :   Social Connections:   . Frequency of Communication with Friends and Family:   . Frequency of Social Gatherings with Friends and Family:   . Attends Religious Services:   . Active Member of Clubs or Organizations:   . Attends Archivist Meetings:   Marland Kitchen Marital Status:   Intimate Partner Violence:   . Fear of Current or Ex-Partner:   . Emotionally Abused:   Marland Kitchen Physically Abused:   . Sexually Abused:     Current Outpatient Medications on File Prior to Visit  Medication Sig Dispense Refill  . acetaminophen (TYLENOL) 500 MG tablet Take 1,000 mg by mouth 2 (two) times daily as needed for moderate pain or headache.    Marland Kitchen amitriptyline (ELAVIL) 50 MG tablet Take 50 mg by mouth at bedtime.    Marland Kitchen atorvastatin (LIPITOR) 80 MG tablet Take 80 mg by mouth at bedtime.    Marland Kitchen BIOTIN PO Take 1 tablet by mouth daily.    . cyanocobalamin (,VITAMIN B-12,) 1000 MCG/ML injection Inject 1,000 mcg into the muscle every 30 (thirty) days.  3  . ergocalciferol (VITAMIN D2) 1.25 MG (50000 UT) capsule Take 50,000 Units by mouth 2 (two) times a week.    . gabapentin (NEURONTIN) 300 MG capsule Take 600-1,200 mg by mouth See admin instructions. Take 600 mg in the morning and afternoon, then take 1200 mg at bedtime    . insulin aspart (NOVOLOG FLEXPEN) 100 UNIT/ML FlexPen Inject 10-20 Units into the  skin 3 (three) times daily with meals. Sliding scale 15 mL 11  . meclizine (ANTIVERT) 25 MG tablet Take 1 tablet (25 mg total) by mouth 3 (three) times daily as needed for dizziness. 30 tablet 0  . midodrine (PROAMATINE)  2.5 MG tablet Take by mouth.    . Multiple Vitamin (MULTIVITAMIN) tablet Take 1 tablet by mouth daily.    Marland Kitchen NARCAN 4 MG/0.1ML LIQD nasal spray kit Place 1 spray into the nose once. If no response after 2 minutes repeat in opposite nostril.  0  . nitroGLYCERIN (NITROSTAT) 0.4 MG SL tablet Place 0.4 mg under the tongue every 5 (five) minutes as needed for chest pain.    Marland Kitchen ondansetron (ZOFRAN-ODT) 4 MG disintegrating tablet Take 4 mg by mouth every 8 (eight) hours as needed.    Marland Kitchen oxycodone (ROXICODONE) 30 MG immediate release tablet Take 15 mg by mouth every 4 (four) hours as needed for pain.    Marland Kitchen OZEMPIC, 0.25 OR 0.5 MG/DOSE, 2 MG/1.5ML SOPN INJECT 0.375 MLS (0.5 MG TOTAL) SUBCUTANEOUSLY ONCE A WEEK FOR 30 DAYS    . pantoprazole (PROTONIX) 40 MG tablet TAKE 1 TABLET (40 MG TOTAL) BY MOUTH 2 (TWO) TIMES DAILY BEFORE MEALS FOR 30 DAYS    . sitaGLIPtin (JANUVIA) 100 MG tablet Take 100 mg by mouth daily.    . sucralfate (CARAFATE) 1 GM/10ML suspension Take by mouth.    Marland Kitchen tiZANidine (ZANAFLEX) 4 MG tablet Limit 1 tab by mouth per day or 2-3 times per day if tolerated (Patient taking differently: Take 4 mg by mouth 3 (three) times daily. ) 90 tablet 0  . traZODone (DESYREL) 50 MG tablet Take 50 mg by mouth at bedtime.   1  . sucralfate (CARAFATE) 1 GM/10ML suspension Take by mouth.     No current facility-administered medications on file prior to visit.    Allergies  Allergen Reactions  . Bactrim [Sulfamethoxazole-Trimethoprim] Nausea And Vomiting  . Ciprofloxacin Swelling  . Vancomycin Nausea And Vomiting  . Amoxicillin Nausea And Vomiting    Patient tolerated Zosyn during the whole admission  Has patient had a PCN reaction causing immediate rash, facial/tongue/throat swelling,  SOB or lightheadedness with hypotension: No Has patient had a PCN reaction causing severe rash involving mucus membranes or skin necrosis: No Has patient had a PCN reaction that required hospitalization: No Has patient had a PCN reaction occurring within the last 10 years: Yes If all of the above answers are "NO", then may proceed with Cephalosporin use.   . Erythromycin Itching  . Nsaids     Avoids due to gastric bypass  . Azithromycin Itching and Rash  . Ceftriaxone Nausea And Vomiting  . Food Itching and Rash    "[place] yogurt"       Observations/Objective: There were no vitals filed for this visit. There is no height or weight on file to calculate BMI.  Physical Exam Neurological:     Mental Status: She is alert.     CBC    Component Value Date/Time   WBC 4.4 01/26/2020 1207   RBC 3.81 (L) 01/26/2020 1207   RBC 3.85 (L) 01/26/2020 1207   HGB 11.1 (L) 01/26/2020 1207   HGB 12.2 07/01/2014 1144   HCT 34.6 (L) 01/26/2020 1207   HCT 38.0 07/01/2014 1144   PLT 181 01/26/2020 1207   PLT 193 07/01/2014 1144   MCV 89.9 01/26/2020 1207   MCV 84 07/01/2014 1144   MCH 28.8 01/26/2020 1207   MCHC 32.1 01/26/2020 1207   RDW 14.0 01/26/2020 1207   RDW 16.8 (H) 07/01/2014 1144   LYMPHSABS 1.3 01/26/2020 1207   LYMPHSABS 1.4 03/20/2014 0139   MONOABS 0.3 01/26/2020 1207   MONOABS 0.6 03/20/2014 0139   EOSABS 0.0  01/26/2020 1207   EOSABS 0.3 03/20/2014 0139   BASOSABS 0.0 01/26/2020 1207   BASOSABS 0.0 03/20/2014 0139    CMP     Component Value Date/Time   NA 143 09/29/2019 1050   NA 138 07/01/2014 1144   K 4.2 09/29/2019 1050   K 4.4 07/01/2014 1144   CL 106 09/29/2019 1050   CL 105 07/01/2014 1144   CO2 29 09/29/2019 1050   CO2 28 07/01/2014 1144   GLUCOSE 95 09/29/2019 1050   GLUCOSE 103 (H) 07/01/2014 1144   BUN 10 09/29/2019 1050   BUN 13 07/01/2014 1144   CREATININE 0.86 09/29/2019 1050   CREATININE 0.71 09/22/2014 1140   CALCIUM 9.3 09/29/2019 1050    CALCIUM 9.3 07/01/2014 1144   PROT 7.4 09/29/2019 1050   PROT 8.2 07/01/2014 1144   ALBUMIN 3.8 09/29/2019 1050   ALBUMIN 3.5 07/01/2014 1144   AST 31 09/29/2019 1050   AST 45 (H) 07/01/2014 1144   ALT 19 09/29/2019 1050   ALT 60 07/01/2014 1144   ALKPHOS 90 09/29/2019 1050   ALKPHOS 136 (H) 07/01/2014 1144   BILITOT 0.5 09/29/2019 1050   BILITOT 0.3 07/01/2014 1144   GFRNONAA >60 09/29/2019 1050   GFRNONAA >60 07/01/2014 1144   GFRNONAA 58 (L) 03/20/2014 0139   GFRAA >60 09/29/2019 1050   GFRAA >60 07/01/2014 1144   GFRAA >60 03/20/2014 0139     Assessment and Plan: 1. Iron deficiency anemia due to chronic blood loss     Labs are reviewed and discussed with patient. Hemoglobin has improved to 11. Iron panel has improved as well.  Ferritin is 41, iron saturation is 15.  She feels energy level has not back to her baseline.  History of gastric sleeve and also history of GI bleeding, recommend her to proceed with one more dose of IV venofer.   Follow Up Instructions: Follow up in 6 months,    I discussed the assessment and treatment plan with the patient. The patient was provided an opportunity to ask questions and all were answered. The patient agreed with the plan and demonstrated an understanding of the instructions.  The patient was advised to call back or seek an in-person evaluation if the symptoms worsen or if the condition fails to improve as anticipated.    Earlie Server, MD 01/28/2020 7:57 PM

## 2020-01-28 NOTE — Progress Notes (Signed)
Pt contacted for Mychart visit. No new concerns voiced.  

## 2020-01-29 ENCOUNTER — Inpatient Hospital Stay: Payer: Medicare HMO

## 2020-02-05 ENCOUNTER — Other Ambulatory Visit: Payer: Self-pay

## 2020-02-05 ENCOUNTER — Ambulatory Visit (INDEPENDENT_AMBULATORY_CARE_PROVIDER_SITE_OTHER): Payer: Medicare HMO | Admitting: Dermatology

## 2020-02-05 DIAGNOSIS — R21 Rash and other nonspecific skin eruption: Secondary | ICD-10-CM

## 2020-02-05 MED ORDER — TRIAMCINOLONE ACETONIDE 0.1 % EX CREA
1.0000 | TOPICAL_CREAM | Freq: Two times a day (BID) | CUTANEOUS | 2 refills | Status: DC
Start: 2020-02-05 — End: 2022-04-17

## 2020-02-05 NOTE — Progress Notes (Signed)
° °  New Patient Visit  Subjective  Marissa [name] is a 44 y.o. female who presents for the following: Rash (of back - very itchy and breaks out in whelps x ~ year.).  The following portions of the chart were reviewed this encounter and updated as appropriate:  Tobacco   Allergies   Meds   Problems   Med Hx   Surg Hx   Fam Hx       Review of Systems:  No other skin or systemic complaints except as noted in HPI or Assessment and Plan.  Objective  Well appearing patient in no apparent distress; mood and affect are within normal limits.  A focused examination was performed including back. Relevant physical exam findings are noted in the Assessment and Plan.  Objective  Back: Macular spotty and linear hyperpigmentation of upper back and excoriations of left scapula.  Images         Assessment & Plan    Rash with Excoriations and Dyschromia - Atopy vs Viral vs Folliculitis Back  triamcinolone cream (KENALOG) 0.1 % - Back  Skin / nail biopsy - Back Type of biopsy: punch   Informed consent: discussed and consent obtained   Timeout: patient name, date of birth, surgical site, and procedure verified   Procedure prep:  Patient was prepped and draped in usual sterile fashion (the patient was cleaned and prepped) Prep type:  Isopropyl alcohol Anesthesia: the lesion was anesthetized in a standard fashion   Anesthetic:  1% lidocaine w/ epinephrine 1-100,000 buffered w/ 8.4% NaHCO3 Punch size:  3 mm Suture size:  4-0 Suture type: nylon   Hemostasis achieved with: suture, pressure and aluminum chloride   Outcome: patient tolerated procedure well   Post-procedure details: sterile dressing applied and wound care instructions given   Dressing type: bandage, petrolatum and pressure dressing    Specimen 1 - Surgical pathology Differential Diagnosis: Atopy vs Folliculitis vs Viral Check Margins: No Macular spotty and linear hyperpigmentation of upper back and excoriations of left  scapula.  Return in about 1 week (around 02/12/2020) for biopsy follow up.  I, Ashok Cordia, CMA, am acting as scribe for Sarina Ser, MD .  Documentation: I have reviewed the above documentation for accuracy and completeness, and I agree with the above.  Sarina Ser, MD

## 2020-02-05 NOTE — Patient Instructions (Signed)

## 2020-02-06 ENCOUNTER — Inpatient Hospital Stay: Payer: Medicare HMO

## 2020-02-10 ENCOUNTER — Encounter: Payer: Self-pay | Admitting: Dermatology

## 2020-02-12 ENCOUNTER — Ambulatory Visit: Payer: Medicare HMO

## 2020-02-12 ENCOUNTER — Other Ambulatory Visit: Payer: Self-pay

## 2020-02-12 DIAGNOSIS — D369 Benign neoplasm, unspecified site: Secondary | ICD-10-CM

## 2020-02-12 NOTE — Progress Notes (Signed)
   Follow-Up Visit   Subjective  Marissa [name] is a 44 y.o. female who presents for the following: Suture / Staple Removal (1 week f/u biopsy proven thicken skin with a scratch/scab on her back ).   The following portions of the chart were reviewed this encounter and updated as appropriate:      Review of Systems:  No other skin or systemic complaints except as noted in HPI or Assessment and Plan.  Objective  Well appearing patient in no apparent distress; mood and affect are within normal limits.  A focused examination was performed including back . Relevant physical exam findings are noted in the Assessment and Plan.  Objective  Back: Well healed scar with no evidence of recurrence.    Assessment & Plan  Benign neoplasm Back  Biopsy proven Benign thicken skin with scratch/scab  Encounter for Removal of Sutures - Incision site at the back is clean, dry and intact - Wound cleansed, sutures removed, wound cleansed and steri strips applied.  - Discussed pathology results showing Benign thicken skin with scratch/scab - Patient advised to keep steri-strips dry until they fall off. - Scars remodel for a full year. - Once steri-strips fall off, patient can apply over-the-counter silicone scar cream each night to help with scar remodeling if desired. - Patient advised to call with any concerns or if they notice any new or changing lesions.   Return if symptoms worsen or fail to improve.   I, Marye Round, CMA, am acting as scribe for [employer] .

## 2020-02-23 ENCOUNTER — Other Ambulatory Visit: Payer: Self-pay

## 2020-02-23 ENCOUNTER — Inpatient Hospital Stay: Payer: Medicare HMO | Attending: Oncology

## 2020-02-23 VITALS — BP 137/81 | HR 97 | Temp 96.5°F | Resp 18

## 2020-02-23 DIAGNOSIS — Z801 Family history of malignant neoplasm of trachea, bronchus and lung: Secondary | ICD-10-CM | POA: Insufficient documentation

## 2020-02-23 DIAGNOSIS — Z803 Family history of malignant neoplasm of breast: Secondary | ICD-10-CM | POA: Diagnosis not present

## 2020-02-23 DIAGNOSIS — Z79899 Other long term (current) drug therapy: Secondary | ICD-10-CM | POA: Diagnosis not present

## 2020-02-23 DIAGNOSIS — D509 Iron deficiency anemia, unspecified: Secondary | ICD-10-CM

## 2020-02-23 DIAGNOSIS — R5383 Other fatigue: Secondary | ICD-10-CM | POA: Insufficient documentation

## 2020-02-23 DIAGNOSIS — Z814 Family history of other substance abuse and dependence: Secondary | ICD-10-CM | POA: Insufficient documentation

## 2020-02-23 DIAGNOSIS — Z833 Family history of diabetes mellitus: Secondary | ICD-10-CM | POA: Diagnosis not present

## 2020-02-23 DIAGNOSIS — Z818 Family history of other mental and behavioral disorders: Secondary | ICD-10-CM | POA: Diagnosis not present

## 2020-02-23 DIAGNOSIS — Z8249 Family history of ischemic heart disease and other diseases of the circulatory system: Secondary | ICD-10-CM | POA: Insufficient documentation

## 2020-02-23 DIAGNOSIS — Z83438 Family history of other disorder of lipoprotein metabolism and other lipidemia: Secondary | ICD-10-CM | POA: Diagnosis not present

## 2020-02-23 MED ORDER — SODIUM CHLORIDE 0.9 % IV SOLN: 200.0000 mg | Freq: Once | INTRAVENOUS | Status: DC

## 2020-02-23 MED ORDER — IRON SUCROSE 20 MG/ML IV SOLN
200.0000 mg | Freq: Once | INTRAVENOUS | Status: AC
  Administered 2020-02-23: 200 mg via INTRAVENOUS
  Filled 2020-02-23: qty 10

## 2020-02-23 MED ORDER — SODIUM CHLORIDE 0.9 % IV SOLN
Freq: Once | INTRAVENOUS | Status: AC
  Filled 2020-02-23: qty 250

## 2020-03-08 ENCOUNTER — Other Ambulatory Visit: Payer: Self-pay

## 2020-03-08 ENCOUNTER — Emergency Department
Admission: EM | Admit: 2020-03-08 | Discharge: 2020-03-09 | Disposition: A | Discharge status: Other Acute Inpatient Hospital | Payer: Medicare HMO | Attending: Emergency Medicine | Admitting: Emergency Medicine

## 2020-03-08 ENCOUNTER — Other Ambulatory Visit: Payer: Self-pay | Admitting: Infectious Diseases

## 2020-03-08 ENCOUNTER — Emergency Department
Admission: RE | Admit: 2020-03-08 | Discharge: 2020-03-08 | Disposition: A | Discharge status: Home / Self Care | Payer: Medicare HMO | Source: Ambulatory Visit | Attending: Infectious Diseases | Admitting: Infectious Diseases

## 2020-03-08 DIAGNOSIS — K219 Gastro-esophageal reflux disease without esophagitis: Secondary | ICD-10-CM | POA: Diagnosis not present

## 2020-03-08 DIAGNOSIS — T148XXA Other injury of unspecified body region, initial encounter: Secondary | ICD-10-CM

## 2020-03-08 DIAGNOSIS — K56609 Unspecified intestinal obstruction, unspecified as to partial versus complete obstruction: Secondary | ICD-10-CM | POA: Insufficient documentation

## 2020-03-08 DIAGNOSIS — K56600 Partial intestinal obstruction, unspecified as to cause: Secondary | ICD-10-CM

## 2020-03-08 DIAGNOSIS — E119 Type 2 diabetes mellitus without complications: Secondary | ICD-10-CM | POA: Diagnosis not present

## 2020-03-08 DIAGNOSIS — R109 Unspecified abdominal pain: Secondary | ICD-10-CM | POA: Insufficient documentation

## 2020-03-08 DIAGNOSIS — I2510 Atherosclerotic heart disease of native coronary artery without angina pectoris: Secondary | ICD-10-CM | POA: Diagnosis not present

## 2020-03-08 DIAGNOSIS — L089 Local infection of the skin and subcutaneous tissue, unspecified: Secondary | ICD-10-CM

## 2020-03-08 DIAGNOSIS — Z9861 Coronary angioplasty status: Secondary | ICD-10-CM | POA: Insufficient documentation

## 2020-03-08 DIAGNOSIS — I509 Heart failure, unspecified: Secondary | ICD-10-CM | POA: Diagnosis not present

## 2020-03-08 DIAGNOSIS — R197 Diarrhea, unspecified: Secondary | ICD-10-CM | POA: Diagnosis not present

## 2020-03-08 DIAGNOSIS — I700 Atherosclerosis of aorta: Secondary | ICD-10-CM | POA: Diagnosis not present

## 2020-03-08 DIAGNOSIS — I110 Hypertensive heart disease with heart failure: Secondary | ICD-10-CM | POA: Insufficient documentation

## 2020-03-08 DIAGNOSIS — R1030 Lower abdominal pain, unspecified: Secondary | ICD-10-CM | POA: Insufficient documentation

## 2020-03-08 LAB — GLUCOSE, CAPILLARY: Glucose-Capillary: 69 mg/dL — ABNORMAL LOW (ref 70–99)

## 2020-03-08 MED ORDER — ONDANSETRON HCL 4 MG/2ML IJ SOLN
4.0000 mg | Freq: Once | INTRAMUSCULAR | Status: AC
  Administered 2020-03-09: 4 mg via INTRAVENOUS
  Filled 2020-03-08: qty 2

## 2020-03-08 MED ORDER — SODIUM CHLORIDE 0.9 % IV SOLN: Freq: Once | INTRAVENOUS | Status: AC

## 2020-03-08 MED ORDER — IOHEXOL 300 MG/ML  SOLN
100.0000 mL | Freq: Once | INTRAMUSCULAR | Status: AC | PRN
  Administered 2020-03-08: 100 mL via INTRAVENOUS

## 2020-03-08 MED ORDER — MORPHINE SULFATE (PF) 4 MG/ML IV SOLN
4.0000 mg | Freq: Once | INTRAVENOUS | Status: AC
  Administered 2020-03-09: 4 mg via INTRAVENOUS
  Filled 2020-03-08: qty 1

## 2020-03-08 MED ORDER — DEXTROSE 50 % IV SOLN
25.0000 g | Freq: Once | INTRAVENOUS | Status: AC
  Administered 2020-03-08: 25 g via INTRAVENOUS
  Filled 2020-03-08: qty 50

## 2020-03-08 MED ORDER — CLINDAMYCIN PHOSPHATE 600 MG/50ML IV SOLN
600.0000 mg | Freq: Once | INTRAVENOUS | Status: AC
  Administered 2020-03-09: 600 mg via INTRAVENOUS
  Filled 2020-03-08 (×2): qty 50

## 2020-03-08 NOTE — ED Notes (Signed)
Accepted by [facility] but will not be able to be transferred until possibly 0300 this am

## 2020-03-08 NOTE — ED Notes (Signed)
Called [facility] for transfer spoke to Lennette Bihari [phone], powershared CT images to [facility]  [phone]

## 2020-03-08 NOTE — ED Notes (Signed)
This RN answered call bell. PT states she would like to go home. Voiced apologies to pt that there is a delay in IV establishment and transport to [facility]. Asked pt if there was anything I could do to make her more comfortable while we wait to handle these delays to which pt states she just wants to go home.  Dr. Beather Arbour notified.

## 2020-03-08 NOTE — ED Triage Notes (Signed)
Pt brought over from outpatient CT due to small bowl obstruction. Pt had surgery on 7/13 at [facility] for same and still has 30 staples to the left lower abdomen. MD at bedside.

## 2020-03-08 NOTE — ED Provider Notes (Addendum)
ER Provider Note       Time seen: 9:33 PM    I have reviewed the vital signs and the nursing notes.  HISTORY   Chief Complaint Abdominal Pain    HPI Marissa [name] is a 44 y.o. female with a history of allergies, anemia, CHF, coronary disease, depression, diabetes, hyperlipidemia, hypertension, migraines, bowel obstruction who presents today for possible small bowel obstruction.  Patient arrives from outpatient CT with findings concerning for partial distal small bowel obstruction.  She had surgery on 713 at [facility] for same and still has approximate 30 staples in the lower abdomen.  She states she did have some diarrhea today, no vomiting today.  Past Medical History:  Diagnosis Date  . Allergy   . Anemia   . Anxiety   . Arthritis   . Back pain, chronic   . Cardiomyopathy, dilated, nonischemic ([place])   . CHF (congestive heart failure) ([place])   . Cholelithiasis   . Coronary artery disease   . Depression   . Diabetes mellitus without complication (HCC)    fasting cbg 50-140s  . Dysrhythmia    tachycardia  . GERD (gastroesophageal reflux disease)    otc meds  . Headache(784.0)   . Hypercholesteremia   . Hyperlipemia   . Hypertension   . IDA (iron deficiency anemia) 09/29/2019  . Insulin pump in place    pt had insulin pump but it is now removed (02-18-16)  . MI, old    2006  . Migraines    once/month maybe - can last up to two weeks  . Myocardial infarction [facility]) 2006   "due to medication"; no evidence of ischemia or infarction by nuclear stress test '11  . Neck pain, chronic   . Neuropathy    legs  . Pneumonia 2015   [facility]  . Prolonged QT interval syndrome   . Renal insufficiency   . Restless leg syndrome, controlled   . Sepsis ([place]) 2016   secondary to surgery  . Sleep apnea    does not use cpap since losing alot of weight  . Vertigo    nop episodes 2020  . Vision loss    due to diabetes  . Wears contact lenses   . Wears dentures    partial lower     Past Surgical History:  Procedure Laterality Date  . ACHILLES TENDON SURGERY Right 04/22/2019   Procedure: [facility] PROCEDURE WITH SUTURE ANCHOR;  Surgeon: Caroline More, DPM;  Location: [place];  Service: Podiatry;  Laterality: Right;  . APPENDECTOMY N/A 03/08/2018   Procedure: APPENDECTOMY;  Surgeon: Benjaman Kindler, MD;  Location: [facility];  Service: Gynecology;  Laterality: N/A;  By Dr. Windell Moment  . BACK SURGERY  2011   Lumbar   . BOWEL RESECTION N/A 03/08/2018   Procedure: SMALL BOWEL RESECTION;  Surgeon: Benjaman Kindler, MD;  Location: [facility];  Service: Gynecology;  Laterality: N/A;  . CARDIAC CATHETERIZATION  07/18/2004   50-60% mid LAD, minor luminal irregularities RCA, normal LM and CX, EF 50-55% [facility])  . carpel tunnel Bilateral   . CERVICAL FUSION  2006  . CHOLECYSTECTOMY N/A 04/08/2015   Procedure: LAPAROSCOPIC CHOLECYSTECTOMY WITH INTRAOPERATIVE CHOLANGIOGRAM;  Surgeon: Florene Glen, MD;  Location: [facility];  Service: General;  Laterality: N/A;  . COLON RESECTION N/A 03/16/2018   Procedure: COLON RESECTION;  Surgeon: Florene Glen, MD;  Location: [facility];  Service: General;  Laterality: N/A;  . COLONOSCOPY WITH PROPOFOL N/A 10/22/2019  Procedure: COLONOSCOPY WITH PROPOFOL;  Surgeon: Toledo, Benay Pike, MD;  Location: [facility];  Service: Gastroenterology;  Laterality: N/A;  [facility] DID RAPID TEST; COPY ON CHART  . ESOPHAGOGASTRODUODENOSCOPY (EGD) WITH PROPOFOL N/A 10/22/2019   Procedure: ESOPHAGOGASTRODUODENOSCOPY (EGD) WITH PROPOFOL;  Surgeon: Toledo, Benay Pike, MD;  Location: [facility];  Service: Gastroenterology;  Laterality: N/A;  . HAMMER TOE SURGERY Right 04/22/2019   Procedure: HAMMER TOE CORRECTION X 4;  Surgeon: Caroline More, DPM;  Location: [place];  Service: Podiatry;  Laterality: Right;  Diabetic - insulin and oral meds  . LAPAROSCOPIC GASTRECTOMY  05/31/2017   [facility], Dr. Darnell Level  . LUMBAR  LAMINECTOMY/DECOMPRESSION MICRODISCECTOMY Right 05/07/2013   Procedure: Right Lumbar one-two laminectomy;  Surgeon: Elaina Hoops, MD;  Location: [place] [facility];  Service: Neurosurgery;  Laterality: Right;  . LUMBAR LAMINECTOMY/DECOMPRESSION MICRODISCECTOMY Left 10/29/2013   Procedure: Left Lumbar five-Sacral one Laminectomy;  Surgeon: Elaina Hoops, MD;  Location: [place] [facility];  Service: Neurosurgery;  Laterality: Left;  . LUMBAR WOUND DEBRIDEMENT N/A 01/25/2014   Procedure: LUMBAR WOUND DEBRIDEMENT;  Surgeon: Charlie Pitter, MD;  Location: [place] [facility]URO [facility]S;  Service: Neurosurgery;  Laterality: N/A;  . LUMBAR WOUND DEBRIDEMENT N/A 02/25/2014   Procedure: LUMBAR WOUND DEBRIDEMENT;  Surgeon: Elaina Hoops, MD;  Location: [place] [facility]UR[facility]S;  Service: Neurosurgery;  Laterality: N/A;  . LYSIS OF ADHESION N/A 03/08/2018   Procedure: LYSIS OF ADHESION;  Surgeon: Benjaman Kindler, MD;  Location: [facility];  Service: Gynecology;  Laterality: N/A;  . SALPINGOOPHORECTOMY Bilateral    1 1997. 2nd 2001  . SHOULDER ARTHROSCOPY WITH BICEPSTENOTOMY Left 03/24/2016   Procedure: shoulder arthroscopy with biceps TENOTOMY, removal loose body, limited synovectomy;  Surgeon: Leanor Kail, MD;  Location: [facility];  Service: Orthopedics;  Laterality: Left;  . shoulder sugery     7/17 rotator cuff  . SMALL BOWEL REPAIR N/A 03/08/2018   Procedure: SMALL BOWEL REPAIR;  Surgeon: Benjaman Kindler, MD;  Location: [facility];  Service: Gynecology;  Laterality: N/A;    Allergies Bactrim [sulfamethoxazole-trimethoprim], Ciprofloxacin, Vancomycin, Amoxicillin, Erythromycin, Nsaids, Azithromycin, Ceftriaxone, and Food   Review of Systems Constitutional: Negative for fever. Cardiovascular: Negative for chest pain. Respiratory: Negative for shortness of breath. Gastrointestinal: Positive for abdominal pain Musculoskeletal: Negative for back pain. Skin: Negative for rash. Neurological: Negative for headaches, focal weakness or numbness.  All  systems negative/normal/unremarkable except as stated in the HPI  ____________________________________________   PHYSICAL EXAM:  VITAL SIGNS: There were no vitals filed for this visit.  Constitutional: Alert and oriented.  Mild distress Eyes: Conjunctivae are normal. Normal extraocular movements. ENT      Head: Normocephalic and atraumatic.      Nose: No congestion/rhinnorhea.      Mouth/Throat: Mucous membranes are moist.      Neck: No stridor. Cardiovascular: Normal rate, regular rhythm. No murmurs, rubs, or gallops. Respiratory: Normal respiratory effort without tachypnea nor retractions. Breath sounds are clear and equal bilaterally. No wheezes/rales/rhonchi. Gastrointestinal: Soft, some distention is noted, midline laparotomy scar with some tenderness and erythema, hypoactive bowel sounds Musculoskeletal: Nontender with normal range of motion in extremities. No lower extremity tenderness nor edema. Neurologic:  Normal speech and language. No gross focal neurologic deficits are appreciated.  Skin:  Skin is warm, dry and intact. No rash noted. Psychiatric: Speech and behavior are normal.  ____________________________________________   LABS (pertinent positives/negatives)  Labs Reviewed  GLUCOSE, CAPILLARY - Abnormal; Notable for the following components:      Result Value  Glucose-Capillary 69 (*)    All other components within normal limits  COMPREHENSIVE METABOLIC PANEL    RADIOLOGY  Images were viewed by me CT abd/pelvis IMPRESSION: Partial distal small bowel obstruction, with transition point at location of surgical staples in anterior pelvis, just deep to rectus abdominus muscles.  Two fluid and gas collections in anterior abdominal wall subcutaneous soft tissues have increased in size, suspicious for abscesses and enteric fistula.  Aortic Atherosclerosis (ICD10-I70.0).  DIFFERENTIAL DIAGNOSIS  CT abd/pelvis IMPRESSION: Partial distal small bowel  obstruction, with transition point at location of surgical staples in anterior pelvis, just deep to rectus abdominus muscles.  Two fluid and gas collections in anterior abdominal wall subcutaneous soft tissues have increased in size, suspicious for abscesses and enteric fistula.  Aortic Atherosclerosis (ICD10-I70.0).   ASSESSMENT AND PLAN  Partial distal small bowel obstruction, anterior abdominal wall infection   Plan: The patient had presented for abnormal outpatient CT. Patient's labs did not reveal leukocytosis, CMP is still pending.  I will order Zosyn for the patient.  We will discussed with the [facility] transfer center for possible transfer back to her general surgeon at [facility].  Patient has been accepted at [facility] by her surgeon Dr. Clydell Hakim.  Lenise Arena MD    Note: This note was generated in part or whole with voice recognition software. Voice recognition is usually quite accurate but there are transcription errors that can and very often do occur. I apologize for any typographical errors that were not detected and corrected.     Earleen Newport, MD 03/08/20 2141    Earleen Newport, MD 03/08/20 2156    Earleen Newport, MD 03/08/20 2156

## 2020-03-09 ENCOUNTER — Other Ambulatory Visit: Payer: Self-pay | Admitting: Infectious Diseases

## 2020-03-09 DIAGNOSIS — K56609 Unspecified intestinal obstruction, unspecified as to partial versus complete obstruction: Secondary | ICD-10-CM | POA: Diagnosis not present

## 2020-03-09 DIAGNOSIS — R1084 Generalized abdominal pain: Secondary | ICD-10-CM

## 2020-03-09 LAB — CBC WITH DIFFERENTIAL/PLATELET
Abs Immature Granulocytes: 0.05 10*3/uL (ref 0.00–0.07)
Basophils Absolute: 0 10*3/uL (ref 0.0–0.1)
Basophils Relative: 0 %
Eosinophils Absolute: 0.1 10*3/uL (ref 0.0–0.5)
Eosinophils Relative: 2 %
HCT: 26 % — ABNORMAL LOW (ref 36.0–46.0)
Hemoglobin: 7.9 g/dL — ABNORMAL LOW (ref 12.0–15.0)
Immature Granulocytes: 1 %
Lymphocytes Relative: 27 %
Lymphs Abs: 1.5 10*3/uL (ref 0.7–4.0)
MCH: 28.9 pg (ref 26.0–34.0)
MCHC: 30.4 g/dL (ref 30.0–36.0)
MCV: 95.2 fL (ref 80.0–100.0)
Monocytes Absolute: 0.4 10*3/uL (ref 0.1–1.0)
Monocytes Relative: 7 %
Neutro Abs: 3.5 10*3/uL (ref 1.7–7.7)
Neutrophils Relative %: 63 %
Platelets: 299 10*3/uL (ref 150–400)
RBC: 2.73 MIL/uL — ABNORMAL LOW (ref 3.87–5.11)
RDW: 14.3 % (ref 11.5–15.5)
WBC: 5.7 10*3/uL (ref 4.0–10.5)
nRBC: 0 % (ref 0.0–0.2)

## 2020-03-09 LAB — COMPREHENSIVE METABOLIC PANEL
ALT: 12 U/L (ref 0–44)
AST: 15 U/L (ref 15–41)
Albumin: 3 g/dL — ABNORMAL LOW (ref 3.5–5.0)
Alkaline Phosphatase: 77 U/L (ref 38–126)
Anion gap: 11 (ref 5–15)
BUN: 7 mg/dL (ref 6–20)
CO2: 25 mmol/L (ref 22–32)
Calcium: 8.8 mg/dL — ABNORMAL LOW (ref 8.9–10.3)
Chloride: 104 mmol/L (ref 98–111)
Creatinine, Ser: 0.67 mg/dL (ref 0.44–1.00)
GFR calc Af Amer: >60 mL/min (ref >60)
GFR calc non Af Amer: >60 mL/min (ref >60)
Glucose, Bld: 87 mg/dL (ref 70–99)
Potassium: 4.1 mmol/L (ref 3.5–5.1)
Sodium: 140 mmol/L (ref 135–145)
Total Bilirubin: 0.6 mg/dL (ref 0.3–1.2)
Total Protein: 6.4 g/dL — ABNORMAL LOW (ref 6.5–8.1)

## 2020-03-09 MED ORDER — HYDROMORPHONE HCL 1 MG/ML IJ SOLN: 0.5000 mg | Freq: Once | INTRAMUSCULAR | Status: AC

## 2020-03-09 MED ORDER — ONDANSETRON HCL 4 MG/2ML IJ SOLN
INTRAMUSCULAR | Status: AC
  Filled 2020-03-09: qty 2

## 2020-03-09 MED ORDER — HYDROMORPHONE HCL 1 MG/ML IJ SOLN
INTRAMUSCULAR | Status: AC
  Administered 2020-03-09: 0.5 mg via INTRAVENOUS
  Filled 2020-03-09: qty 1

## 2020-03-09 MED ORDER — HYDROMORPHONE HCL 1 MG/ML IJ SOLN
1.0000 mg | Freq: Once | INTRAMUSCULAR | Status: AC
  Administered 2020-03-09: 1 mg via INTRAVENOUS
  Filled 2020-03-09: qty 1

## 2020-03-09 MED ORDER — ONDANSETRON HCL 4 MG/2ML IJ SOLN
4.0000 mg | Freq: Once | INTRAMUSCULAR | Status: AC
  Administered 2020-03-09: 4 mg via INTRAVENOUS

## 2020-03-09 NOTE — ED Provider Notes (Signed)
-----------------------------------------   00:00 AM on 03/09/2020 -----------------------------------------  Patient desiring to be discharged home.  I spoke at length with both the patient and the husband.  Expressed my concern for her developing SBO and intra-abdominal abscesses.  Offered comfort measures; had charge nurse call IV team to expedite IV placement.  Patient agreeable to stay.   Paulette Blanch, MD 03/09/20 [phone]

## 2020-03-09 NOTE — ED Notes (Signed)
PT up to restroom  

## 2020-04-13 ENCOUNTER — Other Ambulatory Visit: Payer: Self-pay

## 2020-04-13 ENCOUNTER — Other Ambulatory Visit: Payer: Self-pay | Admitting: Physician Assistant

## 2020-04-13 ENCOUNTER — Other Ambulatory Visit (HOSPITAL_COMMUNITY): Payer: Self-pay | Admitting: Physician Assistant

## 2020-04-13 ENCOUNTER — Ambulatory Visit
Admission: RE | Admit: 2020-04-13 | Discharge: 2020-04-13 | Disposition: A | Discharge status: Home / Self Care | Payer: Medicare HMO | Source: Ambulatory Visit | Attending: Physician Assistant | Admitting: Physician Assistant

## 2020-04-13 DIAGNOSIS — R1084 Generalized abdominal pain: Secondary | ICD-10-CM | POA: Diagnosis not present

## 2020-04-13 MED ORDER — IOHEXOL 300 MG/ML  SOLN
100.0000 mL | Freq: Once | INTRAMUSCULAR | Status: AC | PRN
  Administered 2020-04-13: 100 mL via INTRAVENOUS

## 2020-06-16 ENCOUNTER — Emergency Department
Admission: EM | Admit: 2020-06-16 | Discharge: 2020-06-16 | Disposition: A | Discharge status: Home / Self Care | Payer: Medicare HMO | Attending: Emergency Medicine | Admitting: Emergency Medicine

## 2020-06-16 ENCOUNTER — Emergency Department: Payer: Medicare HMO

## 2020-06-16 ENCOUNTER — Other Ambulatory Visit: Payer: Self-pay

## 2020-06-16 DIAGNOSIS — I420 Dilated cardiomyopathy: Secondary | ICD-10-CM | POA: Insufficient documentation

## 2020-06-16 DIAGNOSIS — Z794 Long term (current) use of insulin: Secondary | ICD-10-CM | POA: Diagnosis not present

## 2020-06-16 DIAGNOSIS — I509 Heart failure, unspecified: Secondary | ICD-10-CM | POA: Diagnosis not present

## 2020-06-16 DIAGNOSIS — I110 Hypertensive heart disease with heart failure: Secondary | ICD-10-CM | POA: Diagnosis not present

## 2020-06-16 DIAGNOSIS — R079 Chest pain, unspecified: Secondary | ICD-10-CM | POA: Diagnosis present

## 2020-06-16 DIAGNOSIS — R0789 Other chest pain: Secondary | ICD-10-CM | POA: Insufficient documentation

## 2020-06-16 DIAGNOSIS — I252 Old myocardial infarction: Secondary | ICD-10-CM | POA: Diagnosis not present

## 2020-06-16 DIAGNOSIS — E119 Type 2 diabetes mellitus without complications: Secondary | ICD-10-CM | POA: Diagnosis not present

## 2020-06-16 DIAGNOSIS — I2510 Atherosclerotic heart disease of native coronary artery without angina pectoris: Secondary | ICD-10-CM | POA: Insufficient documentation

## 2020-06-16 LAB — TROPONIN I (HIGH SENSITIVITY)
Troponin I (High Sensitivity): 3 ng/L (ref <18)
Troponin I (High Sensitivity): 3 ng/L (ref <18)

## 2020-06-16 LAB — CBC
HCT: 37.8 % (ref 36.0–46.0)
Hemoglobin: 12.4 g/dL (ref 12.0–15.0)
MCH: 29.2 pg (ref 26.0–34.0)
MCHC: 32.8 g/dL (ref 30.0–36.0)
MCV: 88.9 fL (ref 80.0–100.0)
Platelets: 186 10*3/uL (ref 150–400)
RBC: 4.25 MIL/uL (ref 3.87–5.11)
RDW: 14.3 % (ref 11.5–15.5)
WBC: 4.4 10*3/uL (ref 4.0–10.5)
nRBC: 0 % (ref 0.0–0.2)

## 2020-06-16 LAB — BASIC METABOLIC PANEL
Anion gap: 9 (ref 5–15)
BUN: 9 mg/dL (ref 6–20)
CO2: 27 mmol/L (ref 22–32)
Calcium: 9.5 mg/dL (ref 8.9–10.3)
Chloride: 106 mmol/L (ref 98–111)
Creatinine, Ser: 0.86 mg/dL (ref 0.44–1.00)
GFR, Estimated: >60 mL/min (ref >60)
Glucose, Bld: 84 mg/dL (ref 70–99)
Potassium: 3.4 mmol/L — ABNORMAL LOW (ref 3.5–5.1)
Sodium: 142 mmol/L (ref 135–145)

## 2020-06-16 MED ORDER — IOHEXOL 350 MG/ML SOLN
75.0000 mL | Freq: Once | INTRAVENOUS | Status: AC | PRN
  Administered 2020-06-16: 75 mL via INTRAVENOUS

## 2020-06-16 MED ORDER — IOHEXOL 350 MG/ML SOLN: 100.0000 mL | Freq: Once | INTRAVENOUS | Status: DC | PRN

## 2020-06-16 NOTE — ED Triage Notes (Signed)
Pt comes pov with chest pain starting last night. Gotten worse this morning. Pt states pain is right over her heart and feels really tight. Pt Hx of MI in 2006.

## 2020-06-16 NOTE — ED Notes (Signed)
Pt taken to CT.

## 2020-06-16 NOTE — ED Provider Notes (Addendum)
[facility] Emergency Department Provider Note   ____________________________________________    I have reviewed the triage vital signs and the nursing notes.   HISTORY  Chief Complaint Chest Pain     HPI Marissa [name] is a 44 y.o. female with a history of CAD who presents with complaints of chest pain.  Patient describes chest tightness that started yesterday.  Seem to improve on its own, however when she woke up this morning it had returned.  She describes mild chest tightness.  No shortness of breath.  No fevers chills or cough.  Also describes that yesterday her left arm "felt funny ""at the same time that she was having chest tightness.  She denies headache.  No other neuro deficits.  Did have one episode of nausea.   Past Medical History:  Diagnosis Date  . Allergy   . Anemia   . Anxiety   . Arthritis   . Back pain, chronic   . Cardiomyopathy, dilated, nonischemic ([place])   . CHF (congestive heart failure) ([place])   . Cholelithiasis   . Coronary artery disease   . Depression   . Diabetes mellitus without complication (HCC)    fasting cbg 50-140s  . Dysrhythmia    tachycardia  . GERD (gastroesophageal reflux disease)    otc meds  . Headache(784.0)   . Hypercholesteremia   . Hyperlipemia   . Hypertension   . IDA (iron deficiency anemia) 09/29/2019  . Insulin pump in place    pt had insulin pump but it is now removed (02-18-16)  . MI, old    2006  . Migraines    once/month maybe - can last up to two weeks  . Myocardial infarction [facility]) 2006   "due to medication"; no evidence of ischemia or infarction by nuclear stress test '11  . Neck pain, chronic   . Neuropathy    legs  . Pneumonia 2015   [facility]  . Prolonged QT interval syndrome   . Renal insufficiency   . Restless leg syndrome, controlled   . Sepsis ([place]) 2016   secondary to surgery  . Sleep apnea    does not use cpap since losing alot of weight  . Vertigo    nop episodes  2020  . Vision loss    due to diabetes  . Wears contact lenses   . Wears dentures    partial lower    Patient Active Problem List   Diagnosis Date Noted  . IDA (iron deficiency anemia) 09/29/2019  . Abdominal wall abscess 04/06/2018  . Chronic superficial gastritis without bleeding 04/02/2018  . Abscess in epidural space of lumbar spine 04/02/2018  . Acute abdomen 03/16/2018  . Anastomotic leak of intestine   . Peritonitis ([place])   . Pelvic pain 03/08/2018  . Impingement syndrome of left shoulder 02/07/2018  . Type 2 diabetes mellitus without complication, without long-term current use of insulin ([place]) 11/28/2017  . Other fatigue 11/02/2017  . Chronic tension-type headache, intractable 09/19/2017  . Primary osteoarthritis of left knee 09/10/2017  . Polyarthralgia 09/10/2017  . Chronic left shoulder pain 09/10/2017  . Restless leg syndrome 05/23/2017  . MI, old 05/23/2017  . Congestive heart failure ([place]) 05/23/2017  . Chronic painful diabetic neuropathy ([place]) 02/06/2017  . Diabetes mellitus ([place]) 11/21/2016  . Obstructive sleep apnea syndrome 11/21/2016  . Dyslipidemia 11/21/2016  . Paresthesia 09/06/2016  . Primary insomnia 03/14/2016  . Complete tear of right rotator cuff 02/01/2016  . Cardiomyopathy ([place])  04/23/2015  . Long term current use of antibiotics 04/23/2015  . Biliary colic 88/82/8003  . Calculus of gallbladder with acute cholecystitis 04/07/2015  . Bilateral occipital neuralgia 01/21/2015  . Dizziness 01/04/2015  . Headache 01/04/2015  . DDD (degenerative disc disease), cervical 12/18/2014  . DDD (degenerative disc disease), thoracic 12/18/2014  . DDD (degenerative disc disease), lumbosacral 12/18/2014  . Neuropathy due to secondary diabetes ([place]) 12/18/2014  . Migraine 12/18/2014  . Hypercholesterolemia without hypertriglyceridemia 08/28/2014  . HPV test positive 07/17/2014  . Class 2 obesity 04/15/2014  . CAD in native artery 04/15/2014  .  Breathlessness on exertion 03/24/2014  . Wound infection complicating hardware ([place]) 02/25/2014  . Nausea alone 02/01/2014  . Abdominal pain, unspecified site 02/01/2014  . Severe sepsis(995.92) 01/30/2014  . History of lumbar laminectomy 01/30/2014  . Acute renal failure ([place]) 01/30/2014  . Anemia 01/30/2014  . HTN (hypertension) 01/30/2014  . DM (diabetes mellitus), type 2 with renal complications ([place]) 49/17/9150  . Wound infection 01/25/2014  . Acute respiratory failure with hypoxia ([place]) 01/25/2014  . Type 2 diabetes mellitus treated with insulin ([place]) 01/13/2014  . Essential (primary) hypertension 01/13/2014  . Difficulty speaking 01/13/2014  . Clinical depression 01/13/2014  . Insulin dependent type 2 diabetes mellitus ([place]) 01/13/2014  . Back pain 11/01/2013  . HNP (herniated nucleus pulposus), lumbar 10/29/2013    Past Surgical History:  Procedure Laterality Date  . ACHILLES TENDON SURGERY Right 04/22/2019   Procedure: [facility] PROCEDURE WITH SUTURE ANCHOR;  Surgeon: Caroline More, DPM;  Location: [place];  Service: Podiatry;  Laterality: Right;  . APPENDECTOMY N/A 03/08/2018   Procedure: APPENDECTOMY;  Surgeon: Benjaman Kindler, MD;  Location: [facility] ORS;  Service: Gynecology;  Laterality: N/A;  By Dr. Windell Moment  . BACK SURGERY  2011   Lumbar   . BOWEL RESECTION N/A 03/08/2018   Procedure: SMALL BOWEL RESECTION;  Surgeon: Benjaman Kindler, MD;  Location: [facility] OR[facility];  Service: Gynecology;  Laterality: N/A;  . CARDIAC CATHETERIZATION  07/18/2004   50-60% mid LAD, minor luminal irregularities RCA, normal LM and CX, EF 50-55% [facility])  . carpel tunnel Bilateral   . CERVICAL FUSION  2006  . CHOLECYSTECTOMY N/A 04/08/2015   Procedure: LAPAROSCOPIC CHOLECYSTECTOMY WITH INTRAOPERATIVE CHOLANGIOGRAM;  Surgeon: Florene Glen, MD;  Location: [facility] ORS;  Service: General;  Laterality: N/A;  . COLON RESECTION N/A 03/16/2018   Procedure: COLON RESECTION;   Surgeon: Florene Glen, MD;  Location: [facility];  Service: General;  Laterality: N/A;  . COLONOSCOPY WITH PROPOFOL N/A 10/22/2019   Procedure: COLONOSCOPY WITH PROPOFOL;  Surgeon: Toledo, Benay Pike, MD;  Location: [facility];  Service: Gastroenterology;  Laterality: N/A;  St[facility] DID RAPID TEST; COPY ON CHART  . ESOPHAGOGASTRODUODENOSCOPY (EGD) WITH PROPOFOL N/A 10/22/2019   Procedure: ESOPHAGOGASTRODUODENOSCOPY (EGD) WITH PROPOFOL;  Surgeon: Toledo, Benay Pike, MD;  Location: [facility];  Service: Gastroenterology;  Laterality: N/A;  . HAMMER TOE SURGERY Right 04/22/2019   Procedure: HAMMER TOE CORRECTION X 4;  Surgeon: Caroline More, DPM;  Location: [place];  Service: Podiatry;  Laterality: Right;  Diabetic - insulin and oral meds  . LAPAROSCOPIC GASTRECTOMY  05/31/2017   [facility], Dr. Darnell Level  . LUMBAR LAMINECTOMY/DECOMPRESSION MICRODISCECTOMY Right 05/07/2013   Procedure: Right Lumbar one-two laminectomy;  Surgeon: Elaina Hoops, MD;  Location: [place] [facility]URO ORS;  Service: Neurosurgery;  Laterality: Right;  . LUMBAR LAMINECTOMY/DECOMPRESSION MICRODISCECTOMY Left 10/29/2013   Procedure: Left Lumbar five-Sacral one Laminectomy;  Surgeon: Elaina Hoops, MD;  Location: [place] NEURO ORS;  Service: Neurosurgery;  Laterality: Left;  . LUMBAR WOUND DEBRIDEMENT N/A 01/25/2014   Procedure: LUMBAR WOUND DEBRIDEMENT;  Surgeon: Charlie Pitter, MD;  [place] [facility];  Service: Neurosurgery;  Laterality: N/A;  . LUMBAR WOUND DEBRIDEMENT N/A 02/25/2014   Procedure: LUMBAR WOUND DEBRIDEMENT;  Surgeon: Elaina Hoops, MD;  Location: [place] NEURO ORS;  Service: Neurosurgery;  Laterality: N/A;  . LYSIS OF ADHESION N/A 03/08/2018   Procedure: LYSIS OF ADHESION;  Surgeon: Benjaman Kindler, MD;  Location: [facility];  Service: Gynecology;  Laterality: N/A;  . SALPINGOOPHORECTOMY Bilateral    1 1997. 2nd 2001  . SHOULDER ARTHROSCOPY WITH BICEPSTENOTOMY Left 03/24/2016   Procedure: shoulder arthroscopy with biceps TENOTOMY,  removal loose body, limited synovectomy;  Surgeon: Leanor Kail, MD;  Location: [facility];  Service: Orthopedics;  Laterality: Left;  . shoulder sugery     7/17 rotator cuff  . SMALL BOWEL REPAIR N/A 03/08/2018   Procedure: SMALL BOWEL REPAIR;  Surgeon: Benjaman Kindler, MD;  Location: [facility];  Service: Gynecology;  Laterality: N/A;    Prior to Admission medications   Medication Sig Start Date End Date Taking? Authorizing Provider  acetaminophen (TYLENOL) 500 MG tablet Take 1,000 mg by mouth 2 (two) times daily as needed for moderate pain or headache.    [provider]  amitriptyline (ELAVIL) 50 MG tablet Take 50 mg by mouth at bedtime.    [provider]  atorvastatin (LIPITOR) 80 MG tablet Take 80 mg by mouth at bedtime. 08/21/19   [provider]  BIOTIN PO Take 1 tablet by mouth daily.    [provider]  cyanocobalamin (,VITAMIN B-12,) 1000 MCG/ML injection Inject 1,000 mcg into the muscle every 30 (thirty) days. 12/25/17   [provider]  ergocalciferol (VITAMIN D2) 1.25 MG (50000 UT) capsule Take 50,000 Units by mouth 2 (two) times a week.    [provider]  gabapentin (NEURONTIN) 300 MG capsule Take 600-1,200 mg by mouth See admin instructions. Take 600 mg in the morning and afternoon, then take 1200 mg at bedtime    [provider]  insulin aspart (NOVOLOG FLEXPEN) 100 UNIT/ML FlexPen Inject 10-20 Units into the skin 3 (three) times daily with meals. Sliding scale 02/04/14   Kinnie Feil, MD  meclizine (ANTIVERT) 25 MG tablet Take 1 tablet (25 mg total) by mouth 3 (three) times daily as needed for dizziness. 12/12/17   Hedges, Dellis Filbert, PA-C  midodrine (PROAMATINE) 2.5 MG tablet Take by mouth. 09/23/19 09/22/20  [provider]  Multiple Vitamin (MULTIVITAMIN) tablet Take 1 tablet by mouth daily.    [provider]  NARCAN 4 MG/0.1ML LIQD nasal spray kit Place 1 spray into the nose once. If no response after  2 minutes repeat in opposite nostril. 01/15/18   [provider]  nitroGLYCERIN (NITROSTAT) 0.4 MG SL tablet Place 0.4 mg under the tongue every 5 (five) minutes as needed for chest pain.    [provider]  ondansetron (ZOFRAN-ODT) 4 MG disintegrating tablet Take 4 mg by mouth every 8 (eight) hours as needed. 09/27/19   [provider]  oxycodone (ROXICODONE) 30 MG immediate release tablet Take 15 mg by mouth every 4 (four) hours as needed for pain.    [provider]  OZEMPIC, 0.25 OR 0.5 MG/DOSE, 2 MG/1.5ML SOPN INJECT 0.375 MLS (0.5 MG TOTAL) SUBCUTANEOUSLY ONCE A WEEK FOR 30 DAYS 08/27/19   [provider]  pantoprazole (PROTONIX) 40 MG tablet  TAKE 1 TABLET (40 MG TOTAL) BY MOUTH 2 (TWO) TIMES DAILY BEFORE MEALS FOR 30 DAYS 09/26/19   [provider]  sitaGLIPtin (JANUVIA) 100 MG tablet Take 100 mg by mouth daily.    [provider]  sucralfate (CARAFATE) 1 GM/10ML suspension Take by mouth. 09/27/19 10/27/19  [provider]  sucralfate (CARAFATE) 1 GM/10ML suspension Take by mouth.    [provider]  tiZANidine (ZANAFLEX) 4 MG tablet Limit 1 tab by mouth per day or 2-3 times per day if tolerated Patient taking differently: Take 4 mg by mouth 3 (three) times daily.  04/19/16   Mohammed Kindle, MD  traZODone (DESYREL) 50 MG tablet Take 50 mg by mouth at bedtime.  11/26/17   [provider]  triamcinolone cream (KENALOG) 0.1 % Apply 1 application topically 2 (two) times daily. Avoid face, groin, underarms. 02/05/20   Ralene Bathe, MD     Allergies Bactrim [sulfamethoxazole-trimethoprim], Ciprofloxacin, Vancomycin, Amoxicillin, Erythromycin, Nsaids, Azithromycin, Ceftriaxone, and Food  Family History  Problem Relation Age of Onset  . Depression Mother   . Drug abuse Mother   . Early death Mother   . Hypertension Mother   . Varicose Veins Mother   . CVA Mother   . Diabetes Father   . Early death Father     . Hyperlipidemia Father   . Heart Problems Father        enlarged geart  . Breast cancer Maternal Grandmother        60's  . Breast cancer Maternal Aunt   . Lung cancer Maternal Grandfather     Social History Social History   Tobacco Use  . Smoking status: Never Smoker  . Smokeless tobacco: Never Used  Vaping Use  . Vaping Use: Never used  Substance Use Topics  . Alcohol use: No  . Drug use: No    Review of Systems  Constitutional: No fever/chills Eyes: No visual changes.  ENT: No sore throat. Cardiovascular: As above, no pleurisy Respiratory: Denies shortness of breath. Gastrointestinal: No abdominal pain.  Nausea as above Genitourinary: Negative for dysuria. Musculoskeletal: Negative for back pain. Skin: Negative for rash. Neurological: As above   ____________________________________________   PHYSICAL EXAM:  VITAL SIGNS: ED Triage Vitals  Enc Vitals Group     BP 06/16/20 1119 (!) 155/98     Pulse Rate 06/16/20 1119 (!) 104     Resp 06/16/20 1119 18     Temp 06/16/20 1119 98.3 F (36.8 C)     Temp Source 06/16/20 1119 Oral     SpO2 06/16/20 1119 100 %     Weight 06/16/20 1120 66.7 kg (147 lb)     Height 06/16/20 1120 1.651 m ('5\' 5"' )     Head Circumference --      Peak Flow --      Pain Score 06/16/20 1120 6     Pain Loc --      Pain Edu? --      Excl. in [place]? --     Constitutional: Alert and oriented.   Nose: No congestion/rhinnorhea. Mouth/Throat: Mucous membranes are moist.    Cardiovascular: Normal rate, regular rhythm. Grossly normal heart sounds.  Good peripheral circulation. Respiratory: Normal respiratory effort.  No retractions. Lungs CTAB. Gastrointestinal: Soft and nontender. No distention.  No CVA tenderness.  Musculoskeletal: No lower extremity tenderness nor edema.  Warm and well perfused Neurologic:  Normal speech and language. No gross focal neurologic deficits are appreciated.  Normal strength in all  extremities, cranial nerves  II to XII normal Skin:  Skin is warm, dry and intact. No rash noted. Psychiatric: Mood and affect are normal. Speech and behavior are normal.  ____________________________________________   LABS (all labs ordered are listed, but only abnormal results are displayed)  Labs Reviewed  BASIC METABOLIC PANEL - Abnormal; Notable for the following components:      Result Value   Potassium 3.4 (*)    All other components within normal limits  CBC  POC URINE PREG, ED  TROPONIN I (HIGH SENSITIVITY)  TROPONIN I (HIGH SENSITIVITY)   ____________________________________________  EKG  ED ECG REPORT I, Lavonia Drafts, the attending physician, personally viewed and interpreted this ECG.  Date: 06/16/2020  Rhythm: normal sinus rhythm QRS Axis: normal Intervals: normal ST/T Wave abnormalities: normal Narrative Interpretation: no evidence of acute ischemia  ____________________________________________  RADIOLOGY  Chest x-ray viewed by me, no acute abnormalities noted ____________________________________________   PROCEDURES  Procedure(s) performed: No  .1-3 Lead EKG Interpretation Performed by: Lavonia Drafts, MD Authorized by: Lavonia Drafts, MD     Interpretation: normal     ECG rate assessment: normal     Rhythm: sinus rhythm     Ectopy: none     Conduction: normal       Critical Care performed: No ____________________________________________   INITIAL IMPRESSION / ASSESSMENT AND PLAN / ED COURSE  Pertinent labs & imaging results that were available during my care of the patient were reviewed by me and considered in my medical decision making (see chart for details).  Patient presents with chest tightness described above, she does have a history of CAD, reports a heart attack in 2006 treated medically.  Differential includes ACS, angina, less likely dissection  Initial troponin is quite reassuring, EKG is unremarkable, chest x-ray is benign.  We will send for  CT angiography to evaluate the aorta, second troponin.  ----------------------------------------- 2:46 PM on 06/16/2020 ----------------------------------------- Second troponin is normal, pending CTA     ____________________________________________   FINAL CLINICAL IMPRESSION(S) / ED DIAGNOSES  Final diagnoses:  Atypical chest pain        Note:  This document was prepared using Dragon voice recognition software and may include unintentional dictation errors.   Lavonia Drafts, MD 06/16/20 1448    Lavonia Drafts, MD 06/16/20 [phone]

## 2020-07-21 ENCOUNTER — Telehealth: Payer: Self-pay | Admitting: Oncology

## 2020-07-21 NOTE — Telephone Encounter (Signed)
I don't see recent lab results in her chart.  She is scheduled for lab on 12/13 with virtual MD on 12/15 and poss Venofer on 12/16.

## 2020-07-26 ENCOUNTER — Inpatient Hospital Stay: Payer: Medicare HMO

## 2020-07-26 NOTE — Telephone Encounter (Signed)
Patient had labs drawn in November during an ER visit.  Advised her only thing drawn concerning her anemia was CBC and Dr. Tasia Catchings also draws a ferritin with iron &TIBC.  Lab appt was r/s to morning of virtual visit.

## 2020-07-28 ENCOUNTER — Inpatient Hospital Stay: Payer: Medicare HMO

## 2020-07-28 ENCOUNTER — Inpatient Hospital Stay: Payer: Medicare HMO | Attending: Oncology | Admitting: Oncology

## 2020-07-28 ENCOUNTER — Encounter: Payer: Self-pay | Admitting: Oncology

## 2020-07-28 ENCOUNTER — Other Ambulatory Visit: Payer: Self-pay

## 2020-07-28 DIAGNOSIS — R5383 Other fatigue: Secondary | ICD-10-CM

## 2020-07-28 DIAGNOSIS — Z9884 Bariatric surgery status: Secondary | ICD-10-CM | POA: Insufficient documentation

## 2020-07-28 DIAGNOSIS — Z880 Allergy status to penicillin: Secondary | ICD-10-CM | POA: Diagnosis not present

## 2020-07-28 DIAGNOSIS — Z881 Allergy status to other antibiotic agents status: Secondary | ICD-10-CM | POA: Diagnosis not present

## 2020-07-28 DIAGNOSIS — Z803 Family history of malignant neoplasm of breast: Secondary | ICD-10-CM | POA: Insufficient documentation

## 2020-07-28 DIAGNOSIS — D509 Iron deficiency anemia, unspecified: Secondary | ICD-10-CM | POA: Insufficient documentation

## 2020-07-28 DIAGNOSIS — Z818 Family history of other mental and behavioral disorders: Secondary | ICD-10-CM | POA: Insufficient documentation

## 2020-07-28 DIAGNOSIS — Z8349 Family history of other endocrine, nutritional and metabolic diseases: Secondary | ICD-10-CM | POA: Diagnosis not present

## 2020-07-28 DIAGNOSIS — Z811 Family history of alcohol abuse and dependence: Secondary | ICD-10-CM | POA: Diagnosis not present

## 2020-07-28 DIAGNOSIS — I252 Old myocardial infarction: Secondary | ICD-10-CM | POA: Diagnosis not present

## 2020-07-28 DIAGNOSIS — Z886 Allergy status to analgesic agent status: Secondary | ICD-10-CM | POA: Diagnosis not present

## 2020-07-28 DIAGNOSIS — Z90721 Acquired absence of ovaries, unilateral: Secondary | ICD-10-CM | POA: Insufficient documentation

## 2020-07-28 DIAGNOSIS — Z801 Family history of malignant neoplasm of trachea, bronchus and lung: Secondary | ICD-10-CM | POA: Insufficient documentation

## 2020-07-28 DIAGNOSIS — Z8249 Family history of ischemic heart disease and other diseases of the circulatory system: Secondary | ICD-10-CM | POA: Insufficient documentation

## 2020-07-28 DIAGNOSIS — Z833 Family history of diabetes mellitus: Secondary | ICD-10-CM | POA: Insufficient documentation

## 2020-07-28 DIAGNOSIS — K5730 Diverticulosis of large intestine without perforation or abscess without bleeding: Secondary | ICD-10-CM | POA: Insufficient documentation

## 2020-07-28 DIAGNOSIS — Z79899 Other long term (current) drug therapy: Secondary | ICD-10-CM | POA: Diagnosis not present

## 2020-07-28 DIAGNOSIS — Z9049 Acquired absence of other specified parts of digestive tract: Secondary | ICD-10-CM | POA: Diagnosis not present

## 2020-07-28 DIAGNOSIS — D500 Iron deficiency anemia secondary to blood loss (chronic): Secondary | ICD-10-CM

## 2020-07-28 LAB — CBC WITH DIFFERENTIAL/PLATELET
Abs Immature Granulocytes: 0.02 10*3/uL (ref 0.00–0.07)
Basophils Absolute: 0 10*3/uL (ref 0.0–0.1)
Basophils Relative: 0 %
Eosinophils Absolute: 0 10*3/uL (ref 0.0–0.5)
Eosinophils Relative: 1 %
HCT: 39.4 % (ref 36.0–46.0)
Hemoglobin: 12.8 g/dL (ref 12.0–15.0)
Immature Granulocytes: 1 %
Lymphocytes Relative: 33 %
Lymphs Abs: 1.4 10*3/uL (ref 0.7–4.0)
MCH: 29.6 pg (ref 26.0–34.0)
MCHC: 32.5 g/dL (ref 30.0–36.0)
MCV: 91 fL (ref 80.0–100.0)
Monocytes Absolute: 0.3 10*3/uL (ref 0.1–1.0)
Monocytes Relative: 7 %
Neutro Abs: 2.4 10*3/uL (ref 1.7–7.7)
Neutrophils Relative %: 58 %
Platelets: 177 10*3/uL (ref 150–400)
RBC: 4.33 MIL/uL (ref 3.87–5.11)
RDW: 13.8 % (ref 11.5–15.5)
WBC: 4.2 10*3/uL (ref 4.0–10.5)
nRBC: 0 % (ref 0.0–0.2)

## 2020-07-28 LAB — IRON AND TIBC
Iron: 99 ug/dL (ref 28–170)
Saturation Ratios: 23 % (ref 10.4–31.8)
TIBC: 435 ug/dL (ref 250–450)
UIBC: 336 ug/dL

## 2020-07-28 LAB — FERRITIN: Ferritin: 20 ng/mL (ref 11–307)

## 2020-07-28 NOTE — Progress Notes (Signed)
Patient's energy has slightly improved but not where she feels it should be.  Did try liquid iron but not able to tolerate.

## 2020-07-28 NOTE — Progress Notes (Addendum)
HEMATOLOGY-ONCOLOGY TeleHEALTH VISIT PROGRESS NOTE  I connected with Marissa [name] on 07/28/20 at  1:45 PM EST by video enabled telemedicine visit and verified that I am speaking with the correct person using two identifiers. I discussed the limitations, risks, security and privacy concerns of performing an evaluation and management service by telemedicine and the availability of in-person appointments. The patient expressed understanding and agreed to proceed.   Other persons participating in the visit and their role in the encounter:  None  Patient's location: Home  Provider's location: office Chief Complaint: iron deficiency anemia.   PERTINENT HEMATOLOGY HISTORY admitted in April 2021 at [facility] due to abdominal pain, AKI. and had enteroscopy done on 12/11/2019. No specimen was collected. The area of ulceration and stenosis could not be reached with this antegrade approach. 12/15/2019 enteroscopy examined portion of the ileum was normal, diverticulosis in the left colon.  Patient follows up with bariatric surgery.  Patient has a history of gastric sleeve.  INTERVAL HISTORY Marissa [name] is a 44 y.o. female who has above history reviewed by me today presents for follow up visit for management of iron deficiency anemia.  Problems and complaints are listed below:  I attempted to connect the patient for visual enabled telehealth visit.  Due to the technical difficulties with video,  Patient was transitioned to audio only visit.  She follows up with her bariatric surgeon.  panniculectomy surgery planned in January  Today she reports fatigue slightly better on not at the level she wants. No new complaints.  Review of Systems  Constitutional: Positive for fatigue. Negative for appetite change, chills and fever.  HENT:   Negative for hearing loss and voice change.   Eyes: Negative for eye problems.  Respiratory: Negative for chest tightness and cough.   Cardiovascular: Negative for chest  pain.  Gastrointestinal: Negative for abdominal distention, abdominal pain and blood in stool.  Endocrine: Negative for hot flashes.  Genitourinary: Negative for difficulty urinating and frequency.   Musculoskeletal: Negative for arthralgias.  Skin: Negative for itching and rash.  Neurological: Negative for extremity weakness.  Hematological: Negative for adenopathy.  Psychiatric/Behavioral: Negative for confusion.    Past Medical History:  Diagnosis Date  . Allergy   . Anemia   . Anxiety   . Arthritis   . Back pain, chronic   . Cardiomyopathy, dilated, nonischemic ([place])   . CHF (congestive heart failure) ([place])   . Cholelithiasis   . Coronary artery disease   . Depression   . Diabetes mellitus without complication (HCC)    fasting cbg 50-140s  . Dysrhythmia    tachycardia  . GERD (gastroesophageal reflux disease)    otc meds  . Headache(784.0)   . Hypercholesteremia   . Hyperlipemia   . Hypertension   . IDA (iron deficiency anemia) 09/29/2019  . Insulin pump in place    pt had insulin pump but it is now removed (02-18-16)  . MI, old    2006  . Migraines    once/month maybe - can last up to two weeks  . Myocardial infarction [facility]) 2006   "due to medication"; no evidence of ischemia or infarction by nuclear stress test '11  . Neck pain, chronic   . Neuropathy    legs  . Pneumonia 2015   [facility]  . Prolonged QT interval syndrome   . Renal insufficiency   . Restless leg syndrome, controlled   . Sepsis ([place]) 2016   secondary to surgery  . Sleep apnea  does not use cpap since losing alot of weight  . Vertigo    nop episodes 2020  . Vision loss    due to diabetes  . Wears contact lenses   . Wears dentures    partial lower   Past Surgical History:  Procedure Laterality Date  . ACHILLES TENDON SURGERY Right 04/22/2019   Procedure: [facility] PROCEDURE WITH SUTURE ANCHOR;  Surgeon: Caroline More, DPM;  Location: [place];  Service: Podiatry;  Laterality: Right;   . APPENDECTOMY N/A 03/08/2018   Procedure: APPENDECTOMY;  Surgeon: Benjaman Kindler, MD;  Location: [facility] ORS;  Service: Gynecology;  Laterality: N/A;  By Dr. Windell Moment  . BACK SURGERY  2011   Lumbar   . BOWEL RESECTION N/A 03/08/2018   Procedure: SMALL BOWEL RESECTION;  Surgeon: Benjaman Kindler, MD;  Location: [facility] ORS;  Service: Gynecology;  Laterality: N/A;  . CARDIAC CATHETERIZATION  07/18/2004   50-60% mid LAD, minor luminal irregularities RCA, normal LM and CX, EF 50-55% [facility])  . carpel tunnel Bilateral   . CERVICAL FUSION  2006  . CHOLECYSTECTOMY N/A 04/08/2015   Procedure: LAPAROSCOPIC CHOLECYSTECTOMY WITH INTRAOPERATIVE CHOLANGIOGRAM;  Surgeon: Florene Glen, MD;  Location: [facility] ORS;  Service: General;  Laterality: N/A;  . COLON RESECTION N/A 03/16/2018   Procedure: COLON RESECTION;  Surgeon: Florene Glen, MD;  Location: [facility] ORS;  Service: General;  Laterality: N/A;  . COLONOSCOPY WITH PROPOFOL N/A 10/22/2019   Procedure: COLONOSCOPY WITH PROPOFOL;  Surgeon: Toledo, Benay Pike, MD;  Location: [facility] ENDOSCOPY;  Service: Gastroenterology;  Laterality: N/A;  [place] DID RAPID TEST; COPY ON CHART  . ESOPHAGOGASTRODUODENOSCOPY (EGD) WITH PROPOFOL N/A 10/22/2019   Procedure: ESOPHAGOGASTRODUODENOSCOPY (EGD) WITH PROPOFOL;  Surgeon: Toledo, Benay Pike, MD;  Location: [facility] ENDOSCOPY;  Service: Gastroenterology;  Laterality: N/A;  . HAMMER TOE SURGERY Right 04/22/2019   Procedure: HAMMER TOE CORRECTION X 4;  Surgeon: Caroline More, DPM;  Location: [place];  Service: Podiatry;  Laterality: Right;  Diabetic - insulin and oral meds  . LAPAROSCOPIC GASTRECTOMY  05/31/2017   [facility], Dr. Darnell Level  . LUMBAR LAMINECTOMY/DECOMPRESSION MICRODISCECTOMY Right 05/07/2013   Procedure: Right Lumbar one-two laminectomy;  Surgeon: Elaina Hoops, MD;  Location: [place] NEURO ORS;  Service: Neurosurgery;  Laterality: Right;  . LUMBAR LAMINECTOMY/DECOMPRESSION MICRODISCECTOMY Left 10/29/2013    Procedure: Left Lumbar five-Sacral one Laminectomy;  Surgeon: Elaina Hoops, MD;  Location: [place] NEURO ORS;  Service: Neurosurgery;  Laterality: Left;  . LUMBAR WOUND DEBRIDEMENT N/A 01/25/2014   Procedure: LUMBAR WOUND DEBRIDEMENT;  Surgeon: Charlie Pitter, MD;  Location: [place] NEURO ORS;  Service: Neurosurgery;  Laterality: N/A;  . LUMBAR WOUND DEBRIDEMENT N/A 02/25/2014   Procedure: LUMBAR WOUND DEBRIDEMENT;  Surgeon: Elaina Hoops, MD;  Location: [place] NEURO ORS;  Service: Neurosurgery;  Laterality: N/A;  . LYSIS OF ADHESION N/A 03/08/2018   Procedure: LYSIS OF ADHESION;  Surgeon: Benjaman Kindler, MD;  Location: [facility];  Service: Gynecology;  Laterality: N/A;  . SALPINGOOPHORECTOMY Bilateral    1 1997. 2nd 2001  . SHOULDER ARTHROSCOPY WITH BICEPSTENOTOMY Left 03/24/2016   Procedure: shoulder arthroscopy with biceps TENOTOMY, removal loose body, limited synovectomy;  Surgeon: Leanor Kail, MD;  Location: [facility] ORS;  Service: Orthopedics;  Laterality: Left;  . shoulder sugery     7/17 rotator cuff  . SMALL BOWEL REPAIR N/A 03/08/2018   Procedure: SMALL BOWEL REPAIR;  Surgeon: Benjaman Kindler, MD;  Location: [facility] OR[facility];  Service: Gynecology;  Laterality: N/A;  Family History  Problem Relation Age of Onset  . Depression Mother   . Drug abuse Mother   . Early death Mother   . Hypertension Mother   . Varicose Veins Mother   . CVA Mother   . Diabetes Father   . Early death Father   . Hyperlipidemia Father   . Heart Problems Father        enlarged geart  . Breast cancer Maternal Grandmother        60's  . Breast cancer Maternal Aunt   . Lung cancer Maternal Grandfather     Social History   Socioeconomic History  . Marital status: Married    Spouse name: Not on file  . Number of children: 0  . Years of education: College  . Highest education level: Not on file  Occupational History  . Occupation: Glass blower/designer  Tobacco Use  . Smoking status: Never Smoker  . Smokeless tobacco: Never  Used  Vaping Use  . Vaping Use: Never used  Substance and Sexual Activity  . Alcohol use: No  . Drug use: No  . Sexual activity: Yes    Birth control/protection: Surgical  Other Topics Concern  . Not on file  Social History Narrative   Lives at home with her fiance.   Right-handed.   Occasional caffeine use.   Social Determinants of Health   Financial Resource Strain: Not on file  Food Insecurity: Not on file  Transportation Needs: Not on file  Physical Activity: Not on file  Stress: Not on file  Social Connections: Not on file  Intimate Partner Violence: Not on file    Current Outpatient Medications on File Prior to Visit  Medication Sig Dispense Refill  . acetaminophen (TYLENOL) 500 MG tablet Take 1,000 mg by mouth 2 (two) times daily as needed for moderate pain or headache.    Marland Kitchen BIOTIN PO Take 1 tablet by mouth daily.    . cyanocobalamin (,VITAMIN B-12,) 1000 MCG/ML injection Inject 1,000 mcg into the muscle every 30 (thirty) days.  3  . ergocalciferol (VITAMIN D2) 1.25 MG (50000 UT) capsule Take 50,000 Units by mouth 2 (two) times a week.    . gabapentin (NEURONTIN) 300 MG capsule Take 600-1,200 mg by mouth See admin instructions. Take 600 mg in the morning and afternoon, then take 1200 mg at bedtime    . insulin aspart (NOVOLOG FLEXPEN) 100 UNIT/ML FlexPen Inject 10-20 Units into the skin 3 (three) times daily with meals. Sliding scale 15 mL 11  . meclizine (ANTIVERT) 25 MG tablet Take 1 tablet (25 mg total) by mouth 3 (three) times daily as needed for dizziness. 30 tablet 0  . Multiple Vitamin (MULTIVITAMIN) tablet Take 1 tablet by mouth daily.    . nitroGLYCERIN (NITROSTAT) 0.4 MG SL tablet Place 0.4 mg under the tongue every 5 (five) minutes as needed for chest pain.    Marland Kitchen ondansetron (ZOFRAN-ODT) 4 MG disintegrating tablet Take 4 mg by mouth every 8 (eight) hours as needed.    Marland Kitchen oxycodone (ROXICODONE) 30 MG immediate release tablet Take 15 mg by mouth every 4 (four) hours  as needed for pain.    Marland Kitchen OZEMPIC, 0.25 OR 0.5 MG/DOSE, 2 MG/1.5ML SOPN INJECT 0.375 MLS (0.5 MG TOTAL) SUBCUTANEOUSLY ONCE A WEEK FOR 30 DAYS    . tiZANidine (ZANAFLEX) 4 MG tablet Limit 1 tab by mouth per day or 2-3 times per day if tolerated (Patient taking differently: Take 4 mg by mouth 3 (three) times daily.)  90 tablet 0  . triamcinolone cream (KENALOG) 0.1 % Apply 1 application topically 2 (two) times daily. Avoid face, groin, underarms. 80 g 2  . amitriptyline (ELAVIL) 50 MG tablet Take 50 mg by mouth at bedtime. (Patient not taking: Reported on 07/28/2020)    . atorvastatin (LIPITOR) 80 MG tablet Take 80 mg by mouth at bedtime. (Patient not taking: Reported on 07/28/2020)    . midodrine (PROAMATINE) 2.5 MG tablet Take by mouth. (Patient not taking: Reported on 07/28/2020)    . NARCAN 4 MG/0.1ML LIQD nasal spray kit Place 1 spray into the nose once. If no response after 2 minutes repeat in opposite nostril. (Patient not taking: Reported on 07/28/2020)  0  . pantoprazole (PROTONIX) 40 MG tablet TAKE 1 TABLET (40 MG TOTAL) BY MOUTH 2 (TWO) TIMES DAILY BEFORE MEALS FOR 30 DAYS (Patient not taking: Reported on 07/28/2020)    . sitaGLIPtin (JANUVIA) 100 MG tablet Take 100 mg by mouth daily. (Patient not taking: Reported on 07/28/2020)    . sucralfate (CARAFATE) 1 GM/10ML suspension Take by mouth.    . sucralfate (CARAFATE) 1 GM/10ML suspension Take by mouth. (Patient not taking: Reported on 07/28/2020)    . traZODone (DESYREL) 50 MG tablet Take 50 mg by mouth at bedtime.  (Patient not taking: Reported on 07/28/2020)  1   No current facility-administered medications on file prior to visit.    Allergies  Allergen Reactions  . Bactrim [Sulfamethoxazole-Trimethoprim] Nausea And Vomiting  . Ciprofloxacin Swelling  . Vancomycin Nausea And Vomiting  . Amoxicillin Nausea And Vomiting    Patient tolerated Zosyn during the whole admission  Has patient had a PCN reaction causing immediate rash,  facial/tongue/throat swelling, SOB or lightheadedness with hypotension: No Has patient had a PCN reaction causing severe rash involving mucus membranes or skin necrosis: No Has patient had a PCN reaction that required hospitalization: No Has patient had a PCN reaction occurring within the last 10 years: Yes If all of the above answers are "NO", then may proceed with Cephalosporin use.   . Erythromycin Itching  . Nsaids     Avoids due to gastric bypass  . Azithromycin Itching and Rash  . Ceftriaxone Nausea And Vomiting  . Food Itching and Rash    "[place] yogurt"       Observations/Objective: Today's Vitals   07/28/20 1323  PainSc: 0-No pain   There is no height or weight on file to calculate BMI.  Physical Exam Neurological:     Mental Status: She is alert.     CBC    Component Value Date/Time   WBC 4.2 07/28/2020 0826   RBC 4.33 07/28/2020 0826   HGB 12.8 07/28/2020 0826   HGB 12.2 07/01/2014 1144   HCT 39.4 07/28/2020 0826   HCT 38.0 07/01/2014 1144   PLT 177 07/28/2020 0826   PLT 193 07/01/2014 1144   MCV 91.0 07/28/2020 0826   MCV 84 07/01/2014 1144   MCH 29.6 07/28/2020 0826   MCHC 32.5 07/28/2020 0826   RDW 13.8 07/28/2020 0826   RDW 16.8 (H) 07/01/2014 1144   LYMPHSABS 1.4 07/28/2020 0826   LYMPHSABS 1.4 03/20/2014 0139   MONOABS 0.3 07/28/2020 0826   MONOABS 0.6 03/20/2014 0139   EOSABS 0.0 07/28/2020 0826   EOSABS 0.3 03/20/2014 0139   BASOSABS 0.0 07/28/2020 0826   BASOSABS 0.0 03/20/2014 0139    CMP     Component Value Date/Time   NA 142 06/16/2020 1123   NA 138 07/01/2014 1144  K 3.4 (L) 06/16/2020 1123   K 4.4 07/01/2014 1144   CL 106 06/16/2020 1123   CL 105 07/01/2014 1144   CO2 27 06/16/2020 1123   CO2 28 07/01/2014 1144   GLUCOSE 84 06/16/2020 1123   GLUCOSE 103 (H) 07/01/2014 1144   BUN 9 06/16/2020 1123   BUN 13 07/01/2014 1144   CREATININE 0.86 06/16/2020 1123   CREATININE 0.71 09/22/2014 1140   CALCIUM 9.5 06/16/2020 1123    CALCIUM 9.3 07/01/2014 1144   PROT 6.4 (L) 03/09/2020 0030   PROT 8.2 07/01/2014 1144   ALBUMIN 3.0 (L) 03/09/2020 0030   ALBUMIN 3.5 07/01/2014 1144   AST 15 03/09/2020 0030   AST 45 (H) 07/01/2014 1144   ALT 12 03/09/2020 0030   ALT 60 07/01/2014 1144   ALKPHOS 77 03/09/2020 0030   ALKPHOS 136 (H) 07/01/2014 1144   BILITOT 0.6 03/09/2020 0030   BILITOT 0.3 07/01/2014 1144   GFRNONAA >60 06/16/2020 1123   GFRNONAA >60 07/01/2014 1144   GFRNONAA 58 (L) 03/20/2014 0139   GFRAA >60 03/09/2020 0030   GFRAA >60 07/01/2014 1144   GFRAA >60 03/20/2014 0139     Assessment and Plan: 1. Iron deficiency anemia, unspecified iron deficiency anemia type     #Iron deficiency anemia, in the context of gastric sleeve and history of gastric bleeding. Labs are reviewed and discussed with patient Hemoglobin has been stable at 12.8, improved comparing to last visit. Ferritin is stable at 20, with iron saturation 23%. Recommend to hold off IV Venofer.  Repeat blood work in 6 months for evaluation of need of IV Venofer maintenance She agrees with the plan. Continue multivitamin.  Patient gets vitamin injection monthly. Follow Up Instructions: Follow up in 6 months,    I discussed the assessment and treatment plan with the patient. The patient was provided an opportunity to ask questions and all were answered. The patient agreed with the plan and demonstrated an understanding of the instructions.  The patient was advised to call back or seek an in-person evaluation if the symptoms worsen or if the condition fails to improve as anticipated.    Earlie Server, MD 07/28/2020 7:20 PM

## 2020-07-29 ENCOUNTER — Inpatient Hospital Stay: Payer: Medicare HMO

## 2020-11-08 ENCOUNTER — Telehealth: Payer: Self-pay | Admitting: Oncology

## 2020-11-08 NOTE — Telephone Encounter (Signed)
Next appt is scheduled for June.

## 2020-11-08 NOTE — Telephone Encounter (Addendum)
MD would like to move the June appts up, please contact patient.

## 2020-11-08 NOTE — Telephone Encounter (Signed)
Done...  Pt June appts has been moved up to  11/19/20 labs and will RTC on 4/11 MD/+/- Venofer Pt is aware

## 2020-11-08 NOTE — Telephone Encounter (Signed)
Pt is stating she is craving chalk again and thinks she needs to come in sooner for a iron treatment, please advise?

## 2020-11-19 ENCOUNTER — Inpatient Hospital Stay: Payer: 59

## 2020-11-22 ENCOUNTER — Inpatient Hospital Stay: Payer: 59

## 2020-11-22 ENCOUNTER — Inpatient Hospital Stay: Payer: 59 | Admitting: Oncology

## 2020-12-06 ENCOUNTER — Inpatient Hospital Stay: Payer: 59

## 2020-12-07 ENCOUNTER — Other Ambulatory Visit: Payer: Self-pay

## 2020-12-07 ENCOUNTER — Inpatient Hospital Stay: Payer: 59 | Attending: Oncology

## 2020-12-07 DIAGNOSIS — Z803 Family history of malignant neoplasm of breast: Secondary | ICD-10-CM | POA: Diagnosis not present

## 2020-12-07 DIAGNOSIS — Z8349 Family history of other endocrine, nutritional and metabolic diseases: Secondary | ICD-10-CM | POA: Diagnosis not present

## 2020-12-07 DIAGNOSIS — R5383 Other fatigue: Secondary | ICD-10-CM | POA: Insufficient documentation

## 2020-12-07 DIAGNOSIS — Z9884 Bariatric surgery status: Secondary | ICD-10-CM | POA: Insufficient documentation

## 2020-12-07 DIAGNOSIS — Z833 Family history of diabetes mellitus: Secondary | ICD-10-CM | POA: Insufficient documentation

## 2020-12-07 DIAGNOSIS — N179 Acute kidney failure, unspecified: Secondary | ICD-10-CM | POA: Insufficient documentation

## 2020-12-07 DIAGNOSIS — Z818 Family history of other mental and behavioral disorders: Secondary | ICD-10-CM | POA: Insufficient documentation

## 2020-12-07 DIAGNOSIS — R1013 Epigastric pain: Secondary | ICD-10-CM | POA: Diagnosis not present

## 2020-12-07 DIAGNOSIS — D509 Iron deficiency anemia, unspecified: Secondary | ICD-10-CM | POA: Diagnosis not present

## 2020-12-07 DIAGNOSIS — Z801 Family history of malignant neoplasm of trachea, bronchus and lung: Secondary | ICD-10-CM | POA: Insufficient documentation

## 2020-12-07 DIAGNOSIS — Z8249 Family history of ischemic heart disease and other diseases of the circulatory system: Secondary | ICD-10-CM | POA: Diagnosis not present

## 2020-12-07 DIAGNOSIS — R42 Dizziness and giddiness: Secondary | ICD-10-CM | POA: Insufficient documentation

## 2020-12-07 DIAGNOSIS — Z79899 Other long term (current) drug therapy: Secondary | ICD-10-CM | POA: Diagnosis not present

## 2020-12-07 DIAGNOSIS — Z813 Family history of other psychoactive substance abuse and dependence: Secondary | ICD-10-CM | POA: Diagnosis not present

## 2020-12-07 LAB — IRON AND TIBC
Iron: 61 ug/dL (ref 28–170)
Saturation Ratios: 11 % (ref 10.4–31.8)
TIBC: 573 ug/dL — ABNORMAL HIGH (ref 250–450)
UIBC: 512 ug/dL

## 2020-12-07 LAB — CBC WITH DIFFERENTIAL/PLATELET
Abs Immature Granulocytes: 0.02 10*3/uL (ref 0.00–0.07)
Basophils Absolute: 0 10*3/uL (ref 0.0–0.1)
Basophils Relative: 0 %
Eosinophils Absolute: 0 10*3/uL (ref 0.0–0.5)
Eosinophils Relative: 1 %
HCT: 35.9 % — ABNORMAL LOW (ref 36.0–46.0)
Hemoglobin: 11.3 g/dL — ABNORMAL LOW (ref 12.0–15.0)
Immature Granulocytes: 1 %
Lymphocytes Relative: 27 %
Lymphs Abs: 1.1 10*3/uL (ref 0.7–4.0)
MCH: 26.4 pg (ref 26.0–34.0)
MCHC: 31.5 g/dL (ref 30.0–36.0)
MCV: 83.9 fL (ref 80.0–100.0)
Monocytes Absolute: 0.3 10*3/uL (ref 0.1–1.0)
Monocytes Relative: 6 %
Neutro Abs: 2.7 10*3/uL (ref 1.7–7.7)
Neutrophils Relative %: 65 %
Platelets: 183 10*3/uL (ref 150–400)
RBC: 4.28 MIL/uL (ref 3.87–5.11)
RDW: 13.9 % (ref 11.5–15.5)
WBC: 4.1 10*3/uL (ref 4.0–10.5)
nRBC: 0 % (ref 0.0–0.2)

## 2020-12-07 LAB — RETIC PANEL
Immature Retic Fract: 16.1 % — ABNORMAL HIGH (ref 2.3–15.9)
RBC.: 4.23 MIL/uL (ref 3.87–5.11)
Retic Count, Absolute: 53.7 10*3/uL (ref 19.0–186.0)
Retic Ct Pct: 1.3 % (ref 0.4–3.1)
Reticulocyte Hemoglobin: 32.2 pg (ref >27.9)

## 2020-12-07 LAB — FERRITIN: Ferritin: 6 ng/mL — ABNORMAL LOW (ref 11–307)

## 2020-12-08 ENCOUNTER — Inpatient Hospital Stay (HOSPITAL_BASED_OUTPATIENT_CLINIC_OR_DEPARTMENT_OTHER): Payer: 59 | Admitting: Oncology

## 2020-12-08 ENCOUNTER — Inpatient Hospital Stay: Payer: 59

## 2020-12-08 ENCOUNTER — Encounter: Payer: Self-pay | Admitting: Oncology

## 2020-12-08 VITALS — BP 140/84 | HR 91 | Temp 97.0°F | Resp 16 | Wt 158.3 lb

## 2020-12-08 DIAGNOSIS — D509 Iron deficiency anemia, unspecified: Secondary | ICD-10-CM

## 2020-12-08 DIAGNOSIS — Z903 Acquired absence of stomach [part of]: Secondary | ICD-10-CM

## 2020-12-08 MED ORDER — SODIUM CHLORIDE 0.9 % IV SOLN
Freq: Once | INTRAVENOUS | Status: AC
Start: 2020-12-08 — End: 2020-12-08
  Filled 2020-12-08: qty 250

## 2020-12-08 MED ORDER — SODIUM CHLORIDE 0.9 % IV SOLN: 200.0000 mg | Freq: Once | INTRAVENOUS | Status: DC

## 2020-12-08 MED ORDER — IRON SUCROSE 20 MG/ML IV SOLN
200.0000 mg | Freq: Once | INTRAVENOUS | Status: AC
Start: 2020-12-08 — End: 2020-12-08
  Administered 2020-12-08: 200 mg via INTRAVENOUS
  Filled 2020-12-08: qty 10

## 2020-12-08 NOTE — Progress Notes (Signed)
Pt tolerated venofer infusion well with no problems or complaints.  Pt left infusion suite stable and ambulatory.

## 2020-12-08 NOTE — Progress Notes (Signed)
Patient denies new problems/concerns today.   °

## 2020-12-08 NOTE — Progress Notes (Signed)
Hematology/Oncology follow up  note [facility] [phone] [phone]   Patient Care Team: [name], MD as PCP - General (Family Medicine) Theodis Sato, Gertha Calkin, MD as Consulting Physician (Cardiology) Kary Kos, MD as Consulting Physician (Neurosurgery) Earlie Server, MD as Consulting Physician (Hematology and Oncology)  REFERRING PROVIDER: [name], MD CHIEF COMPLAINTS/REASON FOR VISIT:  Follow up of iron deficiency anemia  HISTORY OF PRESENTING ILLNESS:  Marissa [name] is a  45 y.o.  female with PMH listed below was seen in consultation at the request of [name], MD  for evaluation of iron deficiency anemia.   Reviewed patient's recent labs  09/23/2019 labs revealed anemia with hemoglobin of 9.4, MCV 78.7.  Patient was referred to [facility] emergency room on 09/24/2019 for evaluation and IV iron infusion.  Patient reports approximately 4 weeks.  Of generalized fatigue, dizziness and feeling cold. Reviewed patient's previous labs ordered by primary care physician's office, anemia is chronic onset, since at least 2014. I will have any recent iron panel.  02/01/2024 iron saturation 10, ferritin 106. Patient received IV Feraheme and he tolerates well. 09/27/2019, patient went to [facility] ER for evaluation of epigastric pain.  She had CT abdomen pelvis done which showed no acute pathology within the abdomen or pelvis. Associated signs and symptoms: Patient reports fatigue, dizziness and feeling cold.  Denies SOB with exertion.  Denies weight loss, easy bruising, hematochezia, hemoptysis, hematuria. Context:  History of iron deficiency:  Rectal bleeding: Denies Menstrual bleeding/ Vaginal bleeding : LMP 12/21/2014, history of salpingo-oophorectomy bilateral. Hematemesis or hemoptysis : denies Blood in urine : denies  Last endoscopy: Patient had a history of laparoscopic gastric sleeve  April 2021 at [facility] due to abdominal pain,  AKI. and had enteroscopy done on 12/11/2019. No specimen was collected. The area of ulceration and stenosis could not be reached with this antegrade approach. 12/15/2019 enteroscopy examined portion of the ileum was normal, diverticulosis in the left colon.  01/07/2020 he was seen by Dr.Bruce-Bariatric Surgery.  Per patient there was plan of laparoscopic adhesion lysis procedure.    INTERVAL HISTORY Marissa [name] is a 45 y.o. female who has above history reviewed by me today presents for follow up visit for management of iron deficiency anemia Problems and complaints are listed below: S/p IV Venofer treatments in the past. She reports feeling fatigued lately. Denies any black or bloody stool.   Review of Systems  Constitutional: Positive for fatigue. Negative for appetite change, chills and fever.  HENT:   Negative for hearing loss and voice change.   Eyes: Negative for eye problems.  Respiratory: Negative for chest tightness, cough and shortness of breath.   Cardiovascular: Negative for chest pain.  Gastrointestinal: Negative for abdominal distention, abdominal pain and blood in stool.  Endocrine: Negative for hot flashes.  Genitourinary: Negative for difficulty urinating and frequency.   Musculoskeletal: Negative for arthralgias.  Skin: Negative for itching and rash.  Neurological: Negative for extremity weakness.  Hematological: Negative for adenopathy.  Psychiatric/Behavioral: Negative for confusion.    MEDICAL HISTORY:  Past Medical History:  Diagnosis Date  . Allergy   . Anemia   . Anxiety   . Arthritis   . Back pain, chronic   . Cardiomyopathy, dilated, nonischemic ([place])   . CHF (congestive heart failure) ([place])   . Cholelithiasis   . Coronary artery disease   . Depression   . Diabetes mellitus without complication (HCC)    fasting cbg 50-140s  . Dysrhythmia  tachycardia  . GERD (gastroesophageal reflux disease)    otc meds  . Headache(784.0)   .  Hypercholesteremia   . Hyperlipemia   . Hypertension   . IDA (iron deficiency anemia) 09/29/2019  . Insulin pump in place    pt had insulin pump but it is now removed (02-18-16)  . MI, old    2006  . Migraines    once/month maybe - can last up to two weeks  . Myocardial infarction [facility]) 2006   "due to medication"; no evidence of ischemia or infarction by nuclear stress test '11  . Neck pain, chronic   . Neuropathy    legs  . Pneumonia 2015   [facility]  . Prolonged QT interval syndrome   . Renal insufficiency   . Restless leg syndrome, controlled   . Sepsis ([place]) 2016   secondary to surgery  . Sleep apnea    does not use cpap since losing alot of weight  . Vertigo    nop episodes 2020  . Vision loss    due to diabetes  . Wears contact lenses   . Wears dentures    partial lower    SURGICAL HISTORY: Past Surgical History:  Procedure Laterality Date  . ACHILLES TENDON SURGERY Right 04/22/2019   Procedure: [facility] PROCEDURE WITH SUTURE ANCHOR;  Surgeon: Caroline More, DPM;  Location: [place];  Service: Podiatry;  Laterality: Right;  . APPENDECTOMY N/A 03/08/2018   Procedure: APPENDECTOMY;  Surgeon: Benjaman Kindler, MD;  Location: [facility] ORS;  Service: Gynecology;  Laterality: N/A;  By Dr. Windell Moment  . BACK SURGERY  2011   Lumbar   . BOWEL RESECTION N/A 03/08/2018   Procedure: SMALL BOWEL RESECTION;  Surgeon: Benjaman Kindler, MD;  Location: [facility] ORS;  Service: Gynecology;  Laterality: N/A;  . CARDIAC CATHETERIZATION  07/18/2004   50-60% mid LAD, minor luminal irregularities RCA, normal LM and CX, EF 50-55% [facility])  . carpel tunnel Bilateral   . CERVICAL FUSION  2006  . CHOLECYSTECTOMY N/A 04/08/2015   Procedure: LAPAROSCOPIC CHOLECYSTECTOMY WITH INTRAOPERATIVE CHOLANGIOGRAM;  Surgeon: Florene Glen, MD;  Location: [facility] ORS;  Service: General;  Laterality: N/A;  . COLON RESECTION N/A 03/16/2018   Procedure: COLON RESECTION;  Surgeon: Florene Glen,  MD;  Location: [facility] OR[facility];  Service: General;  Laterality: N/A;  . COLONOSCOPY WITH PROPOFOL N/A 10/22/2019   Procedure: COLONOSCOPY WITH PROPOFOL;  Surgeon: Toledo, Benay Pike, MD;  Location: [facility] ENDOSCOPY;  Service: Gastroenterology;  Laterality: N/A;  [facility] DID RAPID TEST; COPY ON CHART  . ESOPHAGOGASTRODUODENOSCOPY (EGD) WITH PROPOFOL N/A 10/22/2019   Procedure: ESOPHAGOGASTRODUODENOSCOPY (EGD) WITH PROPOFOL;  Surgeon: Toledo, Benay Pike, MD;  Location: [facility] ENDOSCOPY;  Service: Gastroenterology;  Laterality: N/A;  . HAMMER TOE SURGERY Right 04/22/2019   Procedure: HAMMER TOE CORRECTION X 4;  Surgeon: Caroline More, DPM;  Location: [place];  Service: Podiatry;  Laterality: Right;  Diabetic - insulin and oral meds  . LAPAROSCOPIC GASTRECTOMY  05/31/2017   [facility], Dr. Darnell Level  . LUMBAR LAMINECTOMY/DECOMPRESSION MICRODISCECTOMY Right 05/07/2013   Procedure: Right Lumbar one-two laminectomy;  Surgeon: Elaina Hoops, MD;  Location: [place] NEURO ORS;  Service: Neurosurgery;  Laterality: Right;  . LUMBAR LAMINECTOMY/DECOMPRESSION MICRODISCECTOMY Left 10/29/2013   Procedure: Left Lumbar five-Sacral one Laminectomy;  Surgeon: Elaina Hoops, MD;  Location: [place] NEURO ORS;  Service: Neurosurgery;  Laterality: Left;  . LUMBAR WOUND DEBRIDEMENT N/A 01/25/2014   Procedure: LUMBAR WOUND DEBRIDEMENT;  Surgeon: Charlie Pitter, MD;  Location: [place] NEURO ORS;  Service: Neurosurgery;  Laterality: N/A;  . LUMBAR WOUND DEBRIDEMENT N/A 02/25/2014   Procedure: LUMBAR WOUND DEBRIDEMENT;  Surgeon: Elaina Hoops, MD;  Location: [place] NEURO ORS;  Service: Neurosurgery;  Laterality: N/A;  . LYSIS OF ADHESION N/A 03/08/2018   Procedure: LYSIS OF ADHESION;  Surgeon: Benjaman Kindler, MD;  Location: [facility] OR[facility];  Service: Gynecology;  Laterality: N/A;  . SALPINGOOPHORECTOMY Bilateral    1 1997. 2nd 2001  . SHOULDER ARTHROSCOPY WITH BICEPSTENOTOMY Left 03/24/2016   Procedure: shoulder arthroscopy with biceps TENOTOMY, removal loose body, limited  synovectomy;  Surgeon: Leanor Kail, MD;  Location: [facility] ORS;  Service: Orthopedics;  Laterality: Left;  . shoulder sugery     7/17 rotator cuff  . SMALL BOWEL REPAIR N/A 03/08/2018   Procedure: SMALL BOWEL REPAIR;  Surgeon: Benjaman Kindler, MD;  Location: [facility] OR[facility];  Service: Gynecology;  Laterality: N/A;    SOCIAL HISTORY: Social History   Socioeconomic History  . Marital status: Married    Spouse name: Not on file  . Number of children: 0  . Years of education: College  . Highest education level: Not on file  Occupational History  . Occupation: Glass blower/designer  Tobacco Use  . Smoking status: Never Smoker  . Smokeless tobacco: Never Used  Vaping Use  . Vaping Use: Never used  Substance and Sexual Activity  . Alcohol use: No  . Drug use: No  . Sexual activity: Yes    Birth control/protection: Surgical  Other Topics Concern  . Not on file  Social History Narrative   Lives at home with her fiance.   Right-handed.   Occasional caffeine use.   Social Determinants of Health   Financial Resource Strain: Not on file  Food Insecurity: Not on file  Transportation Needs: Not on file  Physical Activity: Not on file  Stress: Not on file  Social Connections: Not on file  Intimate Partner Violence: Not on file    FAMILY HISTORY: Family History  Problem Relation Age of Onset  . Depression Mother   . Drug abuse Mother   . Early death Mother   . Hypertension Mother   . Varicose Veins Mother   . CVA Mother   . Diabetes Father   . Early death Father   . Hyperlipidemia Father   . Heart Problems Father        enlarged geart  . Breast cancer Maternal Grandmother        60's  . Breast cancer Maternal Aunt   . Lung cancer Maternal Grandfather     ALLERGIES:  is allergic to other, bactrim [sulfamethoxazole-trimethoprim], ciprofloxacin, vancomycin, amoxicillin, erythromycin, nsaids, azithromycin, ceftriaxone, and food.  MEDICATIONS:  Current Outpatient Medications   Medication Sig Dispense Refill  . acetaminophen (TYLENOL) 500 MG tablet Take 1,000 mg by mouth 2 (two) times daily as needed for moderate pain or headache.    Marland Kitchen atorvastatin (LIPITOR) 80 MG tablet Take 80 mg by mouth at bedtime.    Marland Kitchen BIOTIN PO Take 1 tablet by mouth daily.    . cyanocobalamin (,VITAMIN B-12,) 1000 MCG/ML injection Inject 1,000 mcg into the muscle every 30 (thirty) days.  3  . ergocalciferol (VITAMIN D2) 1.25 MG (50000 UT) capsule Take 50,000 Units by mouth 2 (two) times a week.    . gabapentin (NEURONTIN) 300 MG capsule Take 600-1,200 mg by mouth See admin instructions. Take 600 mg in the morning and afternoon, then take 1200 mg at bedtime    .  insulin aspart (NOVOLOG FLEXPEN) 100 UNIT/ML FlexPen Inject 10-20 Units into the skin 3 (three) times daily with meals. Sliding scale 15 mL 11  . meclizine (ANTIVERT) 25 MG tablet Take 1 tablet (25 mg total) by mouth 3 (three) times daily as needed for dizziness. 30 tablet 0  . Multiple Vitamin (MULTIVITAMIN) tablet Take 1 tablet by mouth daily.    . nitroGLYCERIN (NITROSTAT) 0.4 MG SL tablet Place 0.4 mg under the tongue every 5 (five) minutes as needed for chest pain.    . NON FORMULARY Floradix liquid 1 capful QD    . oxycodone (ROXICODONE) 30 MG immediate release tablet Take 15 mg by mouth every 4 (four) hours as needed for pain.    Marland Kitchen OZEMPIC, 0.25 OR 0.5 MG/DOSE, 2 MG/1.5ML SOPN INJECT 0.375 MLS (0.5 MG TOTAL) SUBCUTANEOUSLY ONCE A WEEK FOR 30 DAYS    . tiZANidine (ZANAFLEX) 4 MG tablet Limit 1 tab by mouth per day or 2-3 times per day if tolerated (Patient taking differently: Take 4 mg by mouth 3 (three) times daily.) 90 tablet 0  . triamcinolone cream (KENALOG) 0.1 % Apply 1 application topically 2 (two) times daily. Avoid face, groin, underarms. 80 g 2  . amitriptyline (ELAVIL) 50 MG tablet Take 50 mg by mouth at bedtime. (Patient not taking: Reported on 12/08/2020)    . NARCAN 4 MG/0.1ML LIQD nasal spray kit Place 1 spray into the  nose once. If no response after 2 minutes repeat in opposite nostril. (Patient not taking: Reported on 12/08/2020)  0  . ondansetron (ZOFRAN-ODT) 4 MG disintegrating tablet Take 4 mg by mouth every 8 (eight) hours as needed. (Patient not taking: Reported on 12/08/2020)    . pantoprazole (PROTONIX) 40 MG tablet TAKE 1 TABLET (40 MG TOTAL) BY MOUTH 2 (TWO) TIMES DAILY BEFORE MEALS FOR 30 DAYS (Patient not taking: Reported on 12/08/2020)    . sitaGLIPtin (JANUVIA) 100 MG tablet Take 100 mg by mouth daily. (Patient not taking: Reported on 12/08/2020)    . sucralfate (CARAFATE) 1 GM/10ML suspension Take by mouth. (Patient not taking: Reported on 12/08/2020)    . sucralfate (CARAFATE) 1 GM/10ML suspension Take by mouth. (Patient not taking: No sig reported)    . traZODone (DESYREL) 50 MG tablet Take 50 mg by mouth at bedtime.  (Patient not taking: Reported on 12/08/2020)  1   No current facility-administered medications for this visit.     PHYSICAL EXAMINATION: ECOG PERFORMANCE STATUS: 1 - Symptomatic but completely ambulatory Vitals:   12/08/20 1419  BP: 140/84  Pulse: 91  Resp: 16  Temp: (!) 97 F (36.1 C)   Filed Weights   12/08/20 1419  Weight: 158 lb 4.8 oz (71.8 kg)    Physical Exam Constitutional:      General: She is not in acute distress. HENT:     Head: Normocephalic and atraumatic.  Eyes:     General: No scleral icterus. Cardiovascular:     Rate and Rhythm: Normal rate and regular rhythm.     Heart sounds: Normal heart sounds.  Pulmonary:     Effort: Pulmonary effort is normal. No respiratory distress.     Breath sounds: No wheezing.  Abdominal:     General: Bowel sounds are normal. There is no distension.     Palpations: Abdomen is soft.  Musculoskeletal:        General: No deformity. Normal range of motion.     Cervical back: Normal range of motion and neck supple.  Skin:  General: Skin is warm and dry.     Findings: No erythema or rash.  Neurological:     Mental  Status: She is alert and oriented to person, place, and time. Mental status is at baseline.     Cranial Nerves: No cranial nerve deficit.     Coordination: Coordination normal.  Psychiatric:        Mood and Affect: Mood normal.       CMP Latest Ref Rng & Units 06/16/2020  Glucose 70 - 99 mg/dL 84  BUN 6 - 20 mg/dL 9  Creatinine 0.44 - 1.00 mg/dL 0.86  Sodium 135 - 145 mmol/L 142  Potassium 3.5 - 5.1 mmol/L 3.4(L)  Chloride 98 - 111 mmol/L 106  CO2 22 - 32 mmol/L 27  Calcium 8.9 - 10.3 mg/dL 9.5  Total Protein 6.5 - 8.1 g/dL -  Total Bilirubin 0.3 - 1.2 mg/dL -  Alkaline Phos 38 - 126 U/L -  AST 15 - 41 U/L -  ALT 0 - 44 U/L -   CBC Latest Ref Rng & Units 12/07/2020  WBC 4.0 - 10.5 K/uL 4.1  Hemoglobin 12.0 - 15.0 g/dL 11.3(L)  Hematocrit 36.0 - 46.0 % 35.9(L)  Platelets 150 - 400 K/uL 183     LABORATORY DATA:  I have reviewed the data as listed Lab Results  Component Value Date   WBC 4.1 12/07/2020   HGB 11.3 (L) 12/07/2020   HCT 35.9 (L) 12/07/2020   MCV 83.9 12/07/2020   PLT 183 12/07/2020   Recent Labs    03/09/20 0030 06/16/20 1123  NA 140 142  K 4.1 3.4*  CL 104 106  CO2 25 27  GLUCOSE 87 84  BUN 7 9  CREATININE 0.67 0.86  CALCIUM 8.8* 9.5  GFRNONAA >60 >60  GFRAA >60  --   PROT 6.4*  --   ALBUMIN 3.0*  --   AST 15  --   ALT 12  --   ALKPHOS 77  --   BILITOT 0.6  --    Iron/TIBC/Ferritin/ %Sat    Component Value Date/Time   IRON 61 12/07/2020 1524   TIBC 573 (H) 12/07/2020 1524   FERRITIN 6 (L) 12/07/2020 1524   IRONPCTSAT 11 12/07/2020 1524     No results found.    ASSESSMENT & PLAN:  1. Iron deficiency anemia, unspecified iron deficiency anemia type   2. H/O gastric sleeve    #iron deficiency anemia.  History of gastric sleeve Labs reviewed and discussed with patient.  Slight decreased hemoglobin to 11.3.  Iron saturation has decreased to 11, increased TIBC 573, ferritin 6. Recommend patient to proceed with IV Venofer weekly  x3  History of gastric sleeve, recommend patient to continue follow-up with gastroenterology. Follow-up in 4 months. Orders Placed This Encounter  Procedures  . CBC with Differential/Platelet    Standing Status:   Future    Standing Expiration Date:   12/08/2021  . Ferritin    Standing Status:   Future    Standing Expiration Date:   12/08/2021  . Iron and TIBC    Standing Status:   Future    Standing Expiration Date:   12/08/2021    All questions were answered. The patient knows to call the clinic with any problems questions or concerns.       Earlie Server, MD, PhD Hematology Oncology [facility] Pager- [phone] 12/08/2020

## 2020-12-16 ENCOUNTER — Inpatient Hospital Stay: Payer: 59 | Attending: Oncology

## 2020-12-16 ENCOUNTER — Other Ambulatory Visit: Payer: Self-pay

## 2020-12-16 VITALS — BP 126/85 | HR 99 | Temp 97.6°F | Resp 16

## 2020-12-16 DIAGNOSIS — D509 Iron deficiency anemia, unspecified: Secondary | ICD-10-CM | POA: Insufficient documentation

## 2020-12-16 MED ORDER — IRON SUCROSE 20 MG/ML IV SOLN
200.0000 mg | Freq: Once | INTRAVENOUS | Status: AC
  Administered 2020-12-16: 200 mg via INTRAVENOUS
  Filled 2020-12-16: qty 10

## 2020-12-16 MED ORDER — SODIUM CHLORIDE 0.9 % IV SOLN
Freq: Once | INTRAVENOUS | Status: AC
  Filled 2020-12-16: qty 250

## 2020-12-16 MED ORDER — SODIUM CHLORIDE 0.9 % IV SOLN: 200.0000 mg | Freq: Once | INTRAVENOUS | Status: DC

## 2020-12-23 ENCOUNTER — Inpatient Hospital Stay: Payer: 59

## 2020-12-23 ENCOUNTER — Other Ambulatory Visit: Payer: Self-pay

## 2020-12-23 VITALS — BP 145/89 | HR 94 | Temp 97.0°F | Resp 17

## 2020-12-23 DIAGNOSIS — D509 Iron deficiency anemia, unspecified: Secondary | ICD-10-CM

## 2020-12-23 MED ORDER — SODIUM CHLORIDE 0.9 % IV SOLN
Freq: Once | INTRAVENOUS | Status: AC
Start: 2020-12-23 — End: 2020-12-23
  Filled 2020-12-23: qty 250

## 2020-12-23 MED ORDER — IRON SUCROSE 20 MG/ML IV SOLN
200.0000 mg | Freq: Once | INTRAVENOUS | Status: AC
  Administered 2020-12-23: 200 mg via INTRAVENOUS
  Filled 2020-12-23: qty 10

## 2020-12-23 MED ORDER — SODIUM CHLORIDE 0.9 % IV SOLN: 200.0000 mg | Freq: Once | INTRAVENOUS | Status: DC

## 2021-01-19 ENCOUNTER — Other Ambulatory Visit: Payer: Medicare HMO

## 2021-01-21 ENCOUNTER — Ambulatory Visit: Payer: Medicare HMO | Admitting: Oncology

## 2021-01-21 ENCOUNTER — Ambulatory Visit: Payer: Medicare HMO

## 2021-01-21 ENCOUNTER — Telehealth: Payer: Self-pay | Admitting: *Deleted

## 2021-01-21 NOTE — Telephone Encounter (Signed)
Asked Dr. B about this message told to put on Lauren early next week due to [place] overpacked schedule.

## 2021-01-21 NOTE — Telephone Encounter (Signed)
Patient called reporting that she had surgery last week (01/14/21 she had a lipoma removed) and that she has been bleeding "profusely" from her navel. She went to see the surgeon yesterday and had labs drawn, ut drainage increased after seeing doctor. She has been told hat her hgb is down to 8.1 from 9.4 pre surgery and they want her to go to [facility] for an iron infusion, She is asking if she can come to [place] instead since it is closer. I told her that we could not infusion her today, but possibly next week early. She is asking to have lab checked and infusion. Of note she states that the drainage form her navel is not bright red, but she is using > 1 abdominal pad per hour for the drainage (She spoke with the On Call Surgeon last night who told her not to worry about it since it is not bright red). Please advise if we can accommodate her early next week.

## 2021-01-24 ENCOUNTER — Encounter: Payer: Self-pay | Admitting: Oncology

## 2021-01-25 ENCOUNTER — Inpatient Hospital Stay: Payer: 59

## 2021-01-25 ENCOUNTER — Other Ambulatory Visit: Payer: Self-pay

## 2021-01-25 ENCOUNTER — Inpatient Hospital Stay: Payer: 59 | Attending: Oncology

## 2021-01-25 ENCOUNTER — Inpatient Hospital Stay (HOSPITAL_BASED_OUTPATIENT_CLINIC_OR_DEPARTMENT_OTHER): Payer: 59 | Admitting: Nurse Practitioner

## 2021-01-25 VITALS — BP 116/66 | HR 115 | Temp 98.2°F | Resp 16

## 2021-01-25 VITALS — BP 112/74 | HR 98 | Resp 16

## 2021-01-25 DIAGNOSIS — R531 Weakness: Secondary | ICD-10-CM | POA: Diagnosis not present

## 2021-01-25 DIAGNOSIS — K219 Gastro-esophageal reflux disease without esophagitis: Secondary | ICD-10-CM | POA: Diagnosis not present

## 2021-01-25 DIAGNOSIS — R52 Pain, unspecified: Secondary | ICD-10-CM | POA: Insufficient documentation

## 2021-01-25 DIAGNOSIS — R109 Unspecified abdominal pain: Secondary | ICD-10-CM | POA: Insufficient documentation

## 2021-01-25 DIAGNOSIS — Z8249 Family history of ischemic heart disease and other diseases of the circulatory system: Secondary | ICD-10-CM | POA: Insufficient documentation

## 2021-01-25 DIAGNOSIS — Z801 Family history of malignant neoplasm of trachea, bronchus and lung: Secondary | ICD-10-CM | POA: Diagnosis not present

## 2021-01-25 DIAGNOSIS — Z8349 Family history of other endocrine, nutritional and metabolic diseases: Secondary | ICD-10-CM | POA: Insufficient documentation

## 2021-01-25 DIAGNOSIS — Z818 Family history of other mental and behavioral disorders: Secondary | ICD-10-CM | POA: Insufficient documentation

## 2021-01-25 DIAGNOSIS — D509 Iron deficiency anemia, unspecified: Secondary | ICD-10-CM

## 2021-01-25 DIAGNOSIS — Z814 Family history of other substance abuse and dependence: Secondary | ICD-10-CM | POA: Diagnosis not present

## 2021-01-25 DIAGNOSIS — E119 Type 2 diabetes mellitus without complications: Secondary | ICD-10-CM | POA: Insufficient documentation

## 2021-01-25 DIAGNOSIS — R5383 Other fatigue: Secondary | ICD-10-CM | POA: Insufficient documentation

## 2021-01-25 DIAGNOSIS — L7682 Other postprocedural complications of skin and subcutaneous tissue: Secondary | ICD-10-CM | POA: Diagnosis not present

## 2021-01-25 DIAGNOSIS — I509 Heart failure, unspecified: Secondary | ICD-10-CM | POA: Insufficient documentation

## 2021-01-25 DIAGNOSIS — I252 Old myocardial infarction: Secondary | ICD-10-CM | POA: Insufficient documentation

## 2021-01-25 DIAGNOSIS — Z803 Family history of malignant neoplasm of breast: Secondary | ICD-10-CM | POA: Insufficient documentation

## 2021-01-25 DIAGNOSIS — Z79899 Other long term (current) drug therapy: Secondary | ICD-10-CM | POA: Diagnosis not present

## 2021-01-25 DIAGNOSIS — F32A Depression, unspecified: Secondary | ICD-10-CM | POA: Insufficient documentation

## 2021-01-25 DIAGNOSIS — D500 Iron deficiency anemia secondary to blood loss (chronic): Secondary | ICD-10-CM

## 2021-01-25 DIAGNOSIS — Z833 Family history of diabetes mellitus: Secondary | ICD-10-CM | POA: Insufficient documentation

## 2021-01-25 LAB — CBC WITH DIFFERENTIAL/PLATELET
Abs Immature Granulocytes: 0.04 10*3/uL (ref 0.00–0.07)
Basophils Absolute: 0 10*3/uL (ref 0.0–0.1)
Basophils Relative: 0 %
Eosinophils Absolute: 0.1 10*3/uL (ref 0.0–0.5)
Eosinophils Relative: 1 %
HCT: 26.9 % — ABNORMAL LOW (ref 36.0–46.0)
Hemoglobin: 8.4 g/dL — ABNORMAL LOW (ref 12.0–15.0)
Immature Granulocytes: 1 %
Lymphocytes Relative: 9 %
Lymphs Abs: 0.6 10*3/uL — ABNORMAL LOW (ref 0.7–4.0)
MCH: 28.4 pg (ref 26.0–34.0)
MCHC: 31.2 g/dL (ref 30.0–36.0)
MCV: 90.9 fL (ref 80.0–100.0)
Monocytes Absolute: 0.5 10*3/uL (ref 0.1–1.0)
Monocytes Relative: 7 %
Neutro Abs: 5.5 10*3/uL (ref 1.7–7.7)
Neutrophils Relative %: 82 %
Platelets: 262 10*3/uL (ref 150–400)
RBC: 2.96 MIL/uL — ABNORMAL LOW (ref 3.87–5.11)
RDW: 20.7 % — ABNORMAL HIGH (ref 11.5–15.5)
WBC: 6.7 10*3/uL (ref 4.0–10.5)
nRBC: 0 % (ref 0.0–0.2)

## 2021-01-25 LAB — IRON AND TIBC
Iron: 16 ug/dL — ABNORMAL LOW (ref 28–170)
Saturation Ratios: 5 % — ABNORMAL LOW (ref 10.4–31.8)
TIBC: 337 ug/dL (ref 250–450)
UIBC: 321 ug/dL

## 2021-01-25 LAB — FERRITIN: Ferritin: 100 ng/mL (ref 11–307)

## 2021-01-25 MED ORDER — OXYCODONE HCL 5 MG PO TABS
5.0000 mg | ORAL_TABLET | Freq: Once | ORAL | Status: AC | PRN
  Administered 2021-01-25: 5 mg via ORAL
  Filled 2021-01-25: qty 1

## 2021-01-25 MED ORDER — SODIUM CHLORIDE 0.9 % IV SOLN
INTRAVENOUS | Status: DC
  Filled 2021-01-25: qty 250

## 2021-01-25 MED ORDER — SODIUM CHLORIDE 0.9 % IV SOLN: 200.0000 mg | Freq: Once | INTRAVENOUS | Status: DC

## 2021-01-25 MED ORDER — IRON SUCROSE 20 MG/ML IV SOLN
200.0000 mg | Freq: Once | INTRAVENOUS | Status: AC
  Administered 2021-01-25: 200 mg via INTRAVENOUS
  Filled 2021-01-25: qty 10

## 2021-01-25 NOTE — Progress Notes (Signed)
Symptom Management [place]  Telephone:([phone] [phone]  Patient Care Team: [name], MD as PCP - General (Family Medicine) Theodis Sato, Gertha Calkin, MD as Consulting Physician (Cardiology) Kary Kos, MD as Consulting Physician (Neurosurgery) Earlie Server, MD as Consulting Physician (Hematology and Oncology)   Name of the patient: Marissa [name]  [id number]  [birth year]   Date of visit: 01/25/21  Diagnosis- Iron Deficiency Anemia  Chief complaint/ Reason for visit- Bleeding and Pain at Surgical Site  Heme history: Patient was initially referred to hematology from emergency room in February 2021 for evaluation and consideration of IV iron.  Patient had had symptoms of fatigue, dizziness, cold feeling for approximately 4 weeks.  Per lab review anemia is chronic since at least 2014.  She has a history of laparoscopic gastric sleeve. She received Feraheme and was switched to Venofer due to insurance.  Has tolerated infusions well without significant side effects.   Interval history-patient is 45 year old female with history of CHF, type 2 diabetes, MI, GERD, prior hysterectomy, gastric bypass surgery revision of abdominal wall incision on 01/14/21 by Dr. Gustavus Messing for lipoma and has had ongoing bleeding from surgical incision of the umbilicus.  She was seen at [facility] ER yesterday and had work-up for fever.  She underwent CT scan which showed large fluid collection consistent with seroma versus hematoma.  Her hemoglobin was 8.9 at that time.  Today, she presents to symptom management clinic for concerns of weakness and ongoing fatigue.  Complains that pain at surgical incision.  She takes Percocet for chronic back pain has been applying a heating pad without relief.  Continues to try to work from home. Limited due to pain, bleeding, fatigue. Says she is saturating roughly an abd pad per hour. Uses an abdominal binder. No falls.  No nausea, vomiting,  constipation, or diarrhea.  She has a history of chronic constipation due to chronic opioid use but this is stable and unchanged.  She has not had recurrent fever.  Last Venofer infusion was on 12/23/2020.  Today, patient requires wheelchair for ambulation and at baseline is independent of her ADLs.   Review of systems- Review of Systems  Constitutional:  Positive for malaise/fatigue. Negative for chills, fever and weight loss.  HENT:  Negative for hearing loss, nosebleeds, sore throat and tinnitus.   Eyes:  Negative for blurred vision and double vision.  Respiratory:  Negative for cough, hemoptysis, shortness of breath and wheezing.   Cardiovascular:  Negative for chest pain, palpitations and leg swelling.  Gastrointestinal:  Positive for abdominal pain (post surgical). Negative for blood in stool, constipation, diarrhea, melena, nausea and vomiting.  Genitourinary:  Negative for dysuria and urgency.  Musculoskeletal:  Negative for back pain, falls, joint pain and myalgias.  Skin:  Negative for itching and rash.  Neurological:  Positive for weakness. Negative for dizziness, tingling, sensory change, loss of consciousness and headaches.  Endo/Heme/Allergies:  Negative for environmental allergies. Does not bruise/bleed easily.  Psychiatric/Behavioral:  Positive for depression. The patient is not nervous/anxious and does not have insomnia.      Allergies  Allergen Reactions   Other Hives, Rash and Other (See Comments)    Greek yogurt only, gastric by pass  [place] yogurt only   Bactrim [Sulfamethoxazole-Trimethoprim] Nausea And Vomiting   Ciprofloxacin Swelling   Vancomycin Nausea And Vomiting   Amoxicillin Nausea And Vomiting    Patient tolerated Zosyn during the whole admission  Has patient had a PCN reaction causing immediate rash, facial/tongue/throat  swelling, SOB or lightheadedness with hypotension: No Has patient had a PCN reaction causing severe rash involving mucus membranes or skin  necrosis: No Has patient had a PCN reaction that required hospitalization: No Has patient had a PCN reaction occurring within the last 10 years: Yes If all of the above answers are "NO", then may proceed with Cephalosporin use.    Erythromycin Itching   Nsaids     Avoids due to gastric bypass   Azithromycin Itching and Rash   Ceftriaxone Nausea And Vomiting   Food Itching and Rash    "Greek yogurt"    Past Medical History:  Diagnosis Date   Allergy    Anemia    Anxiety    Arthritis    Back pain, chronic    Cardiomyopathy, dilated, nonischemic (HCC)    CHF (congestive heart failure) ([place])    Cholelithiasis    Coronary artery disease    Depression    Diabetes mellitus without complication ([place])    fasting cbg 50-140s   Dysrhythmia    tachycardia   GERD (gastroesophageal reflux disease)    otc meds   Headache(784.0)    Hypercholesteremia    Hyperlipemia    Hypertension    IDA (iron deficiency anemia) 09/29/2019   Insulin pump in place    pt had insulin pump but it is now removed (02-18-16)   MI, old    2006   Migraines    once/month maybe - can last up to two weeks   Myocardial infarction [facility]) 2006   "due to medication"; no evidence of ischemia or infarction by nuclear stress test '11   Neck pain, chronic    Neuropathy    legs   Pneumonia 2015   [facility]   Prolonged QT interval syndrome    Renal insufficiency    Restless leg syndrome, controlled    Sepsis ([place]) 2016   secondary to surgery   Sleep apnea    does not use cpap since losing alot of weight   Vertigo    nop episodes 2020   Vision loss    due to diabetes   Wears contact lenses    Wears dentures    partial lower    Past Surgical History:  Procedure Laterality Date   ACHILLES TENDON SURGERY Right 04/22/2019   Procedure: [facility] PROCEDURE WITH SUTURE ANCHOR;  Surgeon: Caroline More, DPM;  Location: [place];  Service: Podiatry;  Laterality: Right;   APPENDECTOMY N/A 03/08/2018   Procedure:  APPENDECTOMY;  Surgeon: Benjaman Kindler, MD;  Location: [facility] ORS;  Service: Gynecology;  Laterality: N/A;  By Dr. Loreli Dollar SURGERY  2011   Lumbar    BOWEL RESECTION N/A 03/08/2018   Procedure: SMALL BOWEL RESECTION;  Surgeon: Benjaman Kindler, MD;  Location: [facility] ORS;  Service: Gynecology;  Laterality: N/A;   CARDIAC CATHETERIZATION  07/18/2004   50-60% mid LAD, minor luminal irregularities RCA, normal LM and CX, EF 50-55% [facility])   carpel tunnel Bilateral    CERVICAL FUSION  2006   CHOLECYSTECTOMY N/A 04/08/2015   Procedure: LAPAROSCOPIC CHOLECYSTECTOMY WITH INTRAOPERATIVE CHOLANGIOGRAM;  Surgeon: Florene Glen, MD;  Location: [facility] ORS;  Service: General;  Laterality: N/A;   COLON RESECTION N/A 03/16/2018   Procedure: COLON RESECTION;  Surgeon: Florene Glen, MD;  Location: [facility] ORS;  Service: General;  Laterality: N/A;   COLONOSCOPY WITH PROPOFOL N/A 10/22/2019   Procedure: COLONOSCOPY WITH PROPOFOL;  Surgeon: Toledo, Benay Pike, MD;  Location: [facility] ENDOSCOPY;  Service: Gastroenterology;  Laterality: N/A;  [facility] DID RAPID TEST; COPY ON CHART   ESOPHAGOGASTRODUODENOSCOPY (EGD) WITH PROPOFOL N/A 10/22/2019   Procedure: ESOPHAGOGASTRODUODENOSCOPY (EGD) WITH PROPOFOL;  Surgeon: Toledo, Benay Pike, MD;  Location: [facility] ENDOSCOPY;  Service: Gastroenterology;  Laterality: N/A;   HAMMER TOE SURGERY Right 04/22/2019   Procedure: HAMMER TOE CORRECTION X 4;  Surgeon: Caroline More, DPM;  Location: [place];  Service: Podiatry;  Laterality: Right;  Diabetic - insulin and oral meds   LAPAROSCOPIC GASTRECTOMY  05/31/2017   [facility], Dr. Darnell Level   LUMBAR LAMINECTOMY/DECOMPRESSION MICRODISCECTOMY Right 05/07/2013   Procedure: Right Lumbar one-two laminectomy;  Surgeon: Elaina Hoops, MD;  Location: [place] NEURO ORS;  Service: Neurosurgery;  Laterality: Right;   LUMBAR LAMINECTOMY/DECOMPRESSION MICRODISCECTOMY Left 10/29/2013   Procedure: Left Lumbar five-Sacral one Laminectomy;   Surgeon: Elaina Hoops, MD;  Location: [place] NEURO ORS;  Service: Neurosurgery;  Laterality: Left;   LUMBAR WOUND DEBRIDEMENT N/A 01/25/2014   Procedure: LUMBAR WOUND DEBRIDEMENT;  Surgeon: Charlie Pitter, MD;  Location: [place] NEURO ORS;  Service: Neurosurgery;  Laterality: N/A;   LUMBAR WOUND DEBRIDEMENT N/A 02/25/2014   Procedure: LUMBAR WOUND DEBRIDEMENT;  Surgeon: Elaina Hoops, MD;  Location: [place] NEURO ORS;  Service: Neurosurgery;  Laterality: N/A;   LYSIS OF ADHESION N/A 03/08/2018   Procedure: LYSIS OF ADHESION;  Surgeon: Benjaman Kindler, MD;  Location: [facility];  Service: Gynecology;  Laterality: N/A;   SALPINGOOPHORECTOMY Bilateral    1 1997. 2nd 2001   SHOULDER ARTHROSCOPY WITH BICEPSTENOTOMY Left 03/24/2016   Procedure: shoulder arthroscopy with biceps TENOTOMY, removal loose body, limited synovectomy;  Surgeon: Leanor Kail, MD;  Location: [facility] ORS;  Service: Orthopedics;  Laterality: Left;   shoulder sugery     7/17 rotator cuff   SMALL BOWEL REPAIR N/A 03/08/2018   Procedure: SMALL BOWEL REPAIR;  Surgeon: Benjaman Kindler, MD;  Location: [facility] OR[facility];  Service: Gynecology;  Laterality: N/A;    Social History   Socioeconomic History   Marital status: Married    Spouse name: Not on file   Number of children: 0   Years of education: College   Highest education level: Not on file  Occupational History   Occupation: Glass blower/designer  Tobacco Use   Smoking status: Never   Smokeless tobacco: Never  Vaping Use   Vaping Use: Never used  Substance and Sexual Activity   Alcohol use: No   Drug use: No   Sexual activity: Yes    Birth control/protection: Surgical  Other Topics Concern   Not on file  Social History Narrative   Lives at home with her fiance.   Right-handed.   Occasional caffeine use.   Social Determinants of Health   Financial Resource Strain: Not on file  Food Insecurity: Not on file  Transportation Needs: Not on file  Physical Activity: Not on file  Stress: Not on file   Social Connections: Not on file  Intimate Partner Violence: Not on file    Family History  Problem Relation Age of On[facility]   Depression Mother    Drug abuse Mother    Early death Mother    Hypertension Mother    Varicose Veins Mother    CVA Mother    Diabetes Father    Early death Father    Hyperlipidemia Father    Heart Problems Father        enlarged geart   Breast cancer Maternal Grandmother        [phone]  Breast cancer Maternal Aunt    Lung cancer Maternal Grandfather      Current Outpatient Medications:    acetaminophen (TYLENOL) 500 MG tablet, Take 1,000 mg by mouth 2 (two) times daily as needed for moderate pain or headache., Disp: , Rfl:    amitriptyline (ELAVIL) 50 MG tablet, Take 50 mg by mouth at bedtime. (Patient not taking: Reported on 12/08/2020), Disp: , Rfl:    atorvastatin (LIPITOR) 80 MG tablet, Take 80 mg by mouth at bedtime., Disp: , Rfl:    BIOTIN PO, Take 1 tablet by mouth daily., Disp: , Rfl:    cyanocobalamin (,VITAMIN B-12,) 1000 MCG/ML injection, Inject 1,000 mcg into the muscle every 30 (thirty) days., Disp: , Rfl: 3   ergocalciferol (VITAMIN D2) 1.25 MG (50000 UT) capsule, Take 50,000 Units by mouth 2 (two) times a week., Disp: , Rfl:    gabapentin (NEURONTIN) 300 MG capsule, Take 600-1,200 mg by mouth See admin instructions. Take 600 mg in the morning and afternoon, then take 1200 mg at bedtime, Disp: , Rfl:    insulin aspart (NOVOLOG FLEXPEN) 100 UNIT/ML FlexPen, Inject 10-20 Units into the skin 3 (three) times daily with meals. Sliding scale, Disp: 15 mL, Rfl: 11   meclizine (ANTIVERT) 25 MG tablet, Take 1 tablet (25 mg total) by mouth 3 (three) times daily as needed for dizziness., Disp: 30 tablet, Rfl: 0   Multiple Vitamin (MULTIVITAMIN) tablet, Take 1 tablet by mouth daily., Disp: , Rfl:    NARCAN 4 MG/0.1ML LIQD nasal spray kit, Place 1 spray into the nose once. If no response after 2 minutes repeat in opposite nostril. (Patient not taking: Reported  on 12/08/2020), Disp: , Rfl: 0   nitroGLYCERIN (NITROSTAT) 0.4 MG SL tablet, Place 0.4 mg under the tongue every 5 (five) minutes as needed for chest pain., Disp: , Rfl:    NON FORMULARY, Floradix liquid 1 capful QD, Disp: , Rfl:    ondansetron (ZOFRAN-ODT) 4 MG disintegrating tablet, Take 4 mg by mouth every 8 (eight) hours as needed. (Patient not taking: Reported on 12/08/2020), Disp: , Rfl:    oxycodone (ROXICODONE) 30 MG immediate release tablet, Take 15 mg by mouth every 4 (four) hours as needed for pain., Disp: , Rfl:    OZEMPIC, 0.25 OR 0.5 MG/DOSE, 2 MG/1.5ML SOPN, INJECT 0.375 MLS (0.5 MG TOTAL) SUBCUTANEOUSLY ONCE A WEEK FOR 30 DAYS, Disp: , Rfl:    pantoprazole (PROTONIX) 40 MG tablet, TAKE 1 TABLET (40 MG TOTAL) BY MOUTH 2 (TWO) TIMES DAILY BEFORE MEALS FOR 30 DAYS (Patient not taking: Reported on 12/08/2020), Disp: , Rfl:    sitaGLIPtin (JANUVIA) 100 MG tablet, Take 100 mg by mouth daily. (Patient not taking: Reported on 12/08/2020), Disp: , Rfl:    sucralfate (CARAFATE) 1 GM/10ML suspension, Take by mouth. (Patient not taking: Reported on 12/08/2020), Disp: , Rfl:    sucralfate (CARAFATE) 1 GM/10ML suspension, Take by mouth. (Patient not taking: No sig reported), Disp: , Rfl:    tiZANidine (ZANAFLEX) 4 MG tablet, Limit 1 tab by mouth per day or 2-3 times per day if tolerated (Patient taking differently: Take 4 mg by mouth 3 (three) times daily.), Disp: 90 tablet, Rfl: 0   traZODone (DESYREL) 50 MG tablet, Take 50 mg by mouth at bedtime.  (Patient not taking: Reported on 12/08/2020), Disp: , Rfl: 1   triamcinolone cream (KENALOG) 0.1 %, Apply 1 application topically 2 (two) times daily. Avoid face, groin, underarms., Disp: 80 g, Rfl: 2  Physical exam:  Vitals:   01/25/21 1244  BP: 116/66  Pulse: (!) 115  Resp: 16  Temp: 98.2 F (36.8 C)  TempSrc: Tympanic  SpO2: 97%   Physical Exam Constitutional:      Appearance: She is well-developed. She is ill-appearing.     Comments: Fatigued  appearing.   HENT:     Head: Atraumatic.     Nose: Nose normal.     Mouth/Throat:     Pharynx: No oropharyngeal exudate.  Eyes:     General: No scleral icterus.    Conjunctiva/sclera: Conjunctivae normal.  Cardiovascular:     Rate and Rhythm: Regular rhythm. Tachycardia present.  Pulmonary:     Effort: Pulmonary effort is normal.     Breath sounds: Normal breath sounds.  Abdominal:     General: There is no distension.     Tenderness: There is abdominal tenderness. There is guarding.     Comments: Wound not assess. Abdominal binder in place.  Musculoskeletal:        General: Normal range of motion.     Comments: Wheelchair. Able to stand and pivot.   Skin:    General: Skin is warm and dry.     Coloration: Skin is pale. Skin is not jaundiced.  Neurological:     Mental Status: She is alert and oriented to person, place, and time.     Motor: No weakness.  Psychiatric:        Mood and Affect: Mood normal.        Behavior: Behavior normal.     CMP Latest Ref Rng & Units 06/16/2020  Glucose 70 - 99 mg/dL 84  BUN 6 - 20 mg/dL 9  Creatinine 0.44 - 1.00 mg/dL 0.86  Sodium 135 - 145 mmol/L 142  Potassium 3.5 - 5.1 mmol/L 3.4(L)  Chloride 98 - 111 mmol/L 106  CO2 22 - 32 mmol/L 27  Calcium 8.9 - 10.3 mg/dL 9.5  Total Protein 6.5 - 8.1 g/dL -  Total Bilirubin 0.3 - 1.2 mg/dL -  Alkaline Phos 38 - 126 U/L -  AST 15 - 41 U/L -  ALT 0 - 44 U/L -   CBC Latest Ref Rng & Units 01/25/2021  WBC 4.0 - 10.5 K/uL 6.7  Hemoglobin 12.0 - 15.0 g/dL 8.4(L)  Hematocrit 36.0 - 46.0 % 26.9(L)  Platelets 150 - 400 K/uL 262    No images are attached to the encounter.  No results found.  Assessment and plan- Patient is a 45 y.o. female s/p gastric bypass and revision who presents to [facility] Clinic for post operative pain and bleeding from her surgical incision. Seen at ER yesterday, diagnosed with hematoma. Due to chronic blood loss and drop in hemoglobin it was recommended that  patient be seen for management.   Hemoglobin 8.9 in ER at [facility] yesterday. Hemoglobin is 8.4. Iron studies and ferritin pending at time of dictation but suspect with blood loss, will be decreased. Proceed with venofer 200 mg IV today and again in 3 days. I will recheck her at that time. Plan for venofer twice next week as well. Baseline hemoglobin in the 11 range more recently. Total of venofer x 4. If she continues to bleed she may require additional iron infusions and will notify the clinic. Otherwise will plan to recheck her counts in a month and re-evaluate her at that time.   Due to acute pain I will give her oxycodone 5 mg PO today in clinic. I've encouraged her to reach  out to her surgery team to evaluation and management of pain associated with surgical hematoma.   Work note provided today and for future appts.     Visit Diagnosis 1. Iron deficiency anemia due to chronic blood loss   2. Pain at surgical incision     Patient expressed understanding and was in agreement with this plan. She also understands that She can call clinic at any time with any questions, concerns, or complaints.   Thank you for allowing me to participate in the care of this very pleasant patient.   Beckey Rutter, DNP, AGNP-C [place] [facility] [place]

## 2021-01-28 ENCOUNTER — Inpatient Hospital Stay: Payer: 59

## 2021-01-28 ENCOUNTER — Inpatient Hospital Stay (HOSPITAL_BASED_OUTPATIENT_CLINIC_OR_DEPARTMENT_OTHER): Payer: 59 | Admitting: Nurse Practitioner

## 2021-01-28 DIAGNOSIS — D509 Iron deficiency anemia, unspecified: Secondary | ICD-10-CM

## 2021-01-28 MED ORDER — SODIUM CHLORIDE 0.9 % IV SOLN: 200.0000 mg | Freq: Once | INTRAVENOUS | Status: DC

## 2021-01-28 MED ORDER — SODIUM CHLORIDE 0.9 % IV SOLN
INTRAVENOUS | Status: DC
  Filled 2021-01-28: qty 250

## 2021-01-28 MED ORDER — IRON SUCROSE 20 MG/ML IV SOLN
200.0000 mg | Freq: Once | INTRAVENOUS | Status: AC
Start: 2021-01-28 — End: 2021-01-28
  Administered 2021-01-28: 200 mg via INTRAVENOUS

## 2021-01-28 NOTE — Patient Instructions (Signed)
[facility]  Discharge Instructions: Thank you for choosing [facility] to provide your oncology and hematology care.  If you have a lab appointment with the Cancer Center, please go directly to the Cancer Center and check in at the registration area.  Wear comfortable clothing and clothing appropriate for easy access to any Portacath or PICC line.   We strive to give you quality time with your provider. You may need to reschedule your appointment if you arrive late (15 or more minutes).  Arriving late affects you and other patients whose appointments are after yours.  Also, if you miss three or more appointments without notifying the office, you may be dismissed from the clinic at the provider's discretion.      For prescription refill requests, have your pharmacy contact our office and allow 72 hours for refills to be completed.    Today you received the following : Venofer   To help prevent nausea and vomiting after your treatment, we encourage you to take your nausea medication as directed.  BELOW ARE SYMPTOMS THAT SHOULD BE REPORTED IMMEDIATELY: . *FEVER GREATER THAN 100.4 F (38 C) OR HIGHER . *CHILLS OR SWEATING . *NAUSEA AND VOMITING THAT IS NOT CONTROLLED WITH YOUR NAUSEA MEDICATION . *UNUSUAL SHORTNESS OF BREATH . *UNUSUAL BRUISING OR BLEEDING . *URINARY PROBLEMS (pain or burning when urinating, or frequent urination) . *BOWEL PROBLEMS (unusual diarrhea, constipation, pain near the anus) . TENDERNESS IN MOUTH AND THROAT WITH OR WITHOUT PRESENCE OF ULCERS (sore throat, sores in mouth, or a toothache) . UNUSUAL RASH, SWELLING OR PAIN  . UNUSUAL VAGINAL DISCHARGE OR ITCHING   Items with * indicate a potential emergency and should be followed up as soon as possible or go to the Emergency Department if any problems should occur.  Please show the CHEMOTHERAPY ALERT CARD or IMMUNOTHERAPY ALERT CARD at check-in to the Emergency  Department and triage nurse.  Should you have questions after your visit or need to cancel or reschedule your appointment, please contact [facility]  [phone] and follow the prompts.  Office hours are 8:00 a.m. to 4:30 p.m. Monday - Friday. Please note that voicemails left after 4:00 p.m. may not be returned until the following business day.  We are closed weekends and major holidays. You have access to a nurse at all times for urgent questions. Please call the main number to the clinic [phone] and follow the prompts.  For any non-urgent questions, you may also contact your provider using MyChart. We now offer e-Visits for anyone 18 and older to request care online for non-urgent symptoms. For details visit [email or web address].   Also download the MyChart app! Go to the app store, search "MyChart", open the app, select , and log in with your MyChart username and password.  Due to Covid, a mask is required upon entering the hospital/clinic. If you do not have a mask, one will be given to you upon arrival. For doctor visits, patients may have 1 support person aged 18 or older with them. For treatment visits, patients cannot have anyone with them due to current Covid guidelines and our immunocompromised population.  

## 2021-01-28 NOTE — Progress Notes (Signed)
Pt here for venofer and evaluation in infusion by NP- Beckey Rutter.  Pt states unable to wait to see her in infusion as she has a prior commitment.  NP aware.

## 2021-02-02 ENCOUNTER — Inpatient Hospital Stay: Payer: 59

## 2021-02-02 ENCOUNTER — Other Ambulatory Visit: Payer: Self-pay | Admitting: Nurse Practitioner

## 2021-02-02 ENCOUNTER — Encounter: Payer: Self-pay | Admitting: Nurse Practitioner

## 2021-02-02 ENCOUNTER — Other Ambulatory Visit: Payer: Self-pay

## 2021-02-02 VITALS — BP 117/71 | HR 91 | Temp 97.2°F | Resp 17

## 2021-02-02 DIAGNOSIS — D509 Iron deficiency anemia, unspecified: Secondary | ICD-10-CM

## 2021-02-02 MED ORDER — SODIUM CHLORIDE 0.9 % IV SOLN: 200.0000 mg | Freq: Once | INTRAVENOUS | Status: DC

## 2021-02-02 MED ORDER — IRON SUCROSE 20 MG/ML IV SOLN
200.0000 mg | Freq: Once | INTRAVENOUS | Status: AC
  Administered 2021-02-02: 200 mg via INTRAVENOUS
  Filled 2021-02-02: qty 10

## 2021-02-02 MED ORDER — SODIUM CHLORIDE 0.9 % IV SOLN
Freq: Once | INTRAVENOUS | Status: AC
  Filled 2021-02-02: qty 250

## 2021-02-02 NOTE — Patient Instructions (Signed)

## 2021-02-02 NOTE — Progress Notes (Signed)
Appt cancelled

## 2021-02-03 ENCOUNTER — Emergency Department: Payer: 59

## 2021-02-03 ENCOUNTER — Encounter: Payer: Self-pay | Admitting: Emergency Medicine

## 2021-02-03 ENCOUNTER — Emergency Department
Admission: EM | Admit: 2021-02-03 | Discharge: 2021-02-03 | Disposition: A | Discharge status: Home / Self Care | Payer: 59 | Attending: Emergency Medicine | Admitting: Emergency Medicine

## 2021-02-03 ENCOUNTER — Other Ambulatory Visit: Payer: Self-pay

## 2021-02-03 DIAGNOSIS — R5383 Other fatigue: Secondary | ICD-10-CM | POA: Insufficient documentation

## 2021-02-03 DIAGNOSIS — I110 Hypertensive heart disease with heart failure: Secondary | ICD-10-CM | POA: Insufficient documentation

## 2021-02-03 DIAGNOSIS — Z794 Long term (current) use of insulin: Secondary | ICD-10-CM | POA: Diagnosis not present

## 2021-02-03 DIAGNOSIS — T889XXA Complication of surgical and medical care, unspecified, initial encounter: Secondary | ICD-10-CM | POA: Insufficient documentation

## 2021-02-03 DIAGNOSIS — I509 Heart failure, unspecified: Secondary | ICD-10-CM | POA: Insufficient documentation

## 2021-02-03 DIAGNOSIS — Z20822 Contact with and (suspected) exposure to covid-19: Secondary | ICD-10-CM | POA: Insufficient documentation

## 2021-02-03 DIAGNOSIS — R0602 Shortness of breath: Secondary | ICD-10-CM | POA: Insufficient documentation

## 2021-02-03 DIAGNOSIS — R531 Weakness: Secondary | ICD-10-CM | POA: Diagnosis not present

## 2021-02-03 DIAGNOSIS — E119 Type 2 diabetes mellitus without complications: Secondary | ICD-10-CM | POA: Diagnosis not present

## 2021-02-03 DIAGNOSIS — R109 Unspecified abdominal pain: Secondary | ICD-10-CM | POA: Diagnosis not present

## 2021-02-03 DIAGNOSIS — R6883 Chills (without fever): Secondary | ICD-10-CM | POA: Diagnosis not present

## 2021-02-03 DIAGNOSIS — R110 Nausea: Secondary | ICD-10-CM | POA: Insufficient documentation

## 2021-02-03 DIAGNOSIS — I2510 Atherosclerotic heart disease of native coronary artery without angina pectoris: Secondary | ICD-10-CM | POA: Insufficient documentation

## 2021-02-03 LAB — COMPREHENSIVE METABOLIC PANEL
ALT: 12 U/L (ref 0–44)
AST: 22 U/L (ref 15–41)
Albumin: 3.6 g/dL (ref 3.5–5.0)
Alkaline Phosphatase: 89 U/L (ref 38–126)
Anion gap: 3 — ABNORMAL LOW (ref 5–15)
BUN: 13 mg/dL (ref 6–20)
CO2: 31 mmol/L (ref 22–32)
Calcium: 9.4 mg/dL (ref 8.9–10.3)
Chloride: 108 mmol/L (ref 98–111)
Creatinine, Ser: 0.77 mg/dL (ref 0.44–1.00)
GFR, Estimated: >60 mL/min (ref >60)
Glucose, Bld: 138 mg/dL — ABNORMAL HIGH (ref 70–99)
Potassium: 3.8 mmol/L (ref 3.5–5.1)
Sodium: 142 mmol/L (ref 135–145)
Total Bilirubin: 0.6 mg/dL (ref 0.3–1.2)
Total Protein: 7.3 g/dL (ref 6.5–8.1)

## 2021-02-03 LAB — CBC WITH DIFFERENTIAL/PLATELET
Abs Immature Granulocytes: 0.04 10*3/uL (ref 0.00–0.07)
Basophils Absolute: 0 10*3/uL (ref 0.0–0.1)
Basophils Relative: 0 %
Eosinophils Absolute: 0 10*3/uL (ref 0.0–0.5)
Eosinophils Relative: 1 %
HCT: 32.1 % — ABNORMAL LOW (ref 36.0–46.0)
Hemoglobin: 10.1 g/dL — ABNORMAL LOW (ref 12.0–15.0)
Immature Granulocytes: 1 %
Lymphocytes Relative: 19 %
Lymphs Abs: 1.1 10*3/uL (ref 0.7–4.0)
MCH: 28.9 pg (ref 26.0–34.0)
MCHC: 31.5 g/dL (ref 30.0–36.0)
MCV: 91.7 fL (ref 80.0–100.0)
Monocytes Absolute: 0.3 10*3/uL (ref 0.1–1.0)
Monocytes Relative: 5 %
Neutro Abs: 4.2 10*3/uL (ref 1.7–7.7)
Neutrophils Relative %: 74 %
Platelets: 352 10*3/uL (ref 150–400)
RBC: 3.5 MIL/uL — ABNORMAL LOW (ref 3.87–5.11)
RDW: 19.6 % — ABNORMAL HIGH (ref 11.5–15.5)
WBC: 5.6 10*3/uL (ref 4.0–10.5)
nRBC: 0 % (ref 0.0–0.2)

## 2021-02-03 LAB — URINALYSIS, COMPLETE (UACMP) WITH MICROSCOPIC
Bacteria, UA: NONE SEEN
Bilirubin Urine: NEGATIVE
Glucose, UA: NEGATIVE mg/dL
Hgb urine dipstick: NEGATIVE
Ketones, ur: NEGATIVE mg/dL
Leukocytes,Ua: NEGATIVE
Nitrite: NEGATIVE
Protein, ur: NEGATIVE mg/dL
Specific Gravity, Urine: >1.046 — ABNORMAL HIGH (ref 1.005–1.030)
pH: 5 (ref 5.0–8.0)

## 2021-02-03 LAB — RESP PANEL BY RT-PCR (FLU A&B, COVID) ARPGX2
Influenza A by PCR: NEGATIVE
Influenza B by PCR: NEGATIVE
SARS Coronavirus 2 by RT PCR: NEGATIVE

## 2021-02-03 LAB — D-DIMER, QUANTITATIVE: D-Dimer, Quant: 0.95 ug/mL-FEU — ABNORMAL HIGH (ref 0.00–0.50)

## 2021-02-03 LAB — TROPONIN I (HIGH SENSITIVITY): Troponin I (High Sensitivity): 3 ng/L (ref <18)

## 2021-02-03 LAB — LACTIC ACID, PLASMA: Lactic Acid, Venous: 1.3 mmol/L (ref 0.5–1.9)

## 2021-02-03 MED ORDER — ONDANSETRON 4 MG PO TBDP: 4.0000 mg | ORAL_TABLET | Freq: Three times a day (TID) | ORAL | 0 refills | Status: AC | PRN

## 2021-02-03 MED ORDER — ACETAMINOPHEN 325 MG PO TABS
650.0000 mg | ORAL_TABLET | Freq: Once | ORAL | Status: AC
  Administered 2021-02-03: 650 mg via ORAL
  Filled 2021-02-03: qty 2

## 2021-02-03 MED ORDER — CEPHALEXIN 500 MG PO CAPS: 500.0000 mg | ORAL_CAPSULE | Freq: Four times a day (QID) | ORAL | 0 refills | Status: AC

## 2021-02-03 MED ORDER — IOHEXOL 350 MG/ML SOLN
75.0000 mL | Freq: Once | INTRAVENOUS | Status: AC | PRN
  Administered 2021-02-03: 75 mL via INTRAVENOUS
  Filled 2021-02-03: qty 75

## 2021-02-03 MED ORDER — FLUCONAZOLE 150 MG PO TABS: ORAL_TABLET | ORAL | 0 refills | Status: DC

## 2021-02-03 NOTE — ED Triage Notes (Signed)
Pt comes into the ED via [facility] and the patient had a lipoma removed on 01/14/21 now the incision site has opened back up and is bleeding. Pt denies any COPD or asthma.  Pt currently has even and unlabored respirations.  Pt states they did put her fully under for the surgery procedure of the lipoma excision.  Pt in NAD at this time.

## 2021-02-03 NOTE — ED Notes (Signed)
Called [facility] bariatrics on call staff for Dr. Robbi Garter, MD.

## 2021-02-03 NOTE — ED Provider Notes (Signed)
[facility]-EMERGENCY DEPARTMENT  ____________________________________________  Time seen: Approximately 4:40 PM  I have reviewed the triage vital signs and the nursing notes.   HISTORY  Chief Complaint Shortness of Breath and Post-op Problem   Historian Patient     HPI Marissa [name] is a 45 y.o. female with a history of CHF, diabetes, hypertension, cardiomyopathy and hyperlipidemia emergency department with shortness of breath, nausea and worsening abdominal discomfort. No cough, chest tightness or chest pain.  Patient reports that shortness of breath has become progressively worse since having a lipoma excised on June 3.  Patient also had panniculectomy during lipoma excision.  Patient has a 1 cm region of dehiscence but has no other wound concerns.  Patient reports that she has had some chills at home but no fever.  No vomiting or diarrhea.  Patient endorses a sensation of extreme fatigue and weakness at home.  No sick contacts in the home at this time.  No other alleviating measures have been attempted.   Past Medical History:  Diagnosis Date   Allergy    Anemia    Anxiety    Arthritis    Back pain, chronic    Cardiomyopathy, dilated, nonischemic (HCC)    CHF (congestive heart failure) (HCC)    Cholelithiasis    Coronary artery disease    Depression    Diabetes mellitus without complication (HCC)    fasting cbg 50-140s   Dysrhythmia    tachycardia   GERD (gastroesophageal reflux disease)    otc meds   Headache(784.0)    Hypercholesteremia    Hyperlipemia    Hypertension    IDA (iron deficiency anemia) 09/29/2019   Insulin pump in place    pt had insulin pump but it is now removed (02-18-16)   MI, old    2006   Migraines    once/month maybe - can last up to two weeks   Myocardial infarction [facility]) 2006   "due to medication"; no evidence of ischemia or infarction by nuclear stress test '11   Neck pain, chronic    Neuropathy    legs   Pneumonia 2015   [facility]    Prolonged QT interval syndrome    Renal insufficiency    Restless leg syndrome, controlled    Sepsis ([place]) 2016   secondary to surgery   Sleep apnea    does not use cpap since losing alot of weight   Vertigo    nop episodes 2020   Vision loss    due to diabetes   Wears contact lenses    Wears dentures    partial lower     Immunizations up to date:  Yes.     Past Medical History:  Diagnosis Date   Allergy    Anemia    Anxiety    Arthritis    Back pain, chronic    Cardiomyopathy, dilated, nonischemic (HCC)    CHF (congestive heart failure) (HCC)    Cholelithiasis    Coronary artery disease    Depression    Diabetes mellitus without complication (HCC)    fasting cbg 50-140s   Dysrhythmia    tachycardia   GERD (gastroesophageal reflux disease)    otc meds   Headache(784.0)    Hypercholesteremia    Hyperlipemia    Hypertension    IDA (iron deficiency anemia) 09/29/2019   Insulin pump in place    pt had insulin pump but it is now removed (02-18-16)   MI, old    2006  Migraines    once/month maybe - can last up to two weeks   Myocardial infarction [facility]) 2006   "due to medication"; no evidence of ischemia or infarction by nuclear stress test '11   Neck pain, chronic    Neuropathy    legs   Pneumonia 2015   [facility]   Prolonged QT interval syndrome    Renal insufficiency    Restless leg syndrome, controlled    Sepsis ([place]) 2016   secondary to surgery   Sleep apnea    does not use cpap since losing alot of weight   Vertigo    nop episodes 2020   Vision loss    due to diabetes   Wears contact lenses    Wears dentures    partial lower    Patient Active Problem List   Diagnosis Date Noted   IDA (iron deficiency anemia) 09/29/2019   Abdominal wall abscess 04/06/2018   Chronic superficial gastritis without bleeding 04/02/2018   Abscess in epidural space of lumbar spine 04/02/2018   Acute abdomen 03/16/2018   Anastomotic leak of intestine    Peritonitis (HCC)     Pelvic pain 03/08/2018   Impingement syndrome of left shoulder 02/07/2018   Type 2 diabetes mellitus without complication, without long-term current use of insulin ([place]) 11/28/2017   Other fatigue 11/02/2017   Chronic tension-type headache, intractable 09/19/2017   Primary osteoarthritis of left knee 09/10/2017   Polyarthralgia 09/10/2017   Chronic left shoulder pain 09/10/2017   Restless leg syndrome 05/23/2017   MI, old 05/23/2017   Congestive heart failure ([place]) 05/23/2017   Chronic painful diabetic neuropathy ([place]) 02/06/2017   Diabetes mellitus ([place]) 11/21/2016   Obstructive sleep apnea syndrome 11/21/2016   Dyslipidemia 11/21/2016   Paresthesia 09/06/2016   Primary insomnia 03/14/2016   Complete tear of right rotator cuff 02/01/2016   Cardiomyopathy ([place]) 04/23/2015   Long term current use of antibiotics 63/84/5364   Biliary colic 68/10/2120   Calculus of gallbladder with acute cholecystitis 04/07/2015   Bilateral occipital neuralgia 01/21/2015   Dizziness 01/04/2015   Headache 01/04/2015   DDD (degenerative disc disease), cervical 12/18/2014   DDD (degenerative disc disease), thoracic 12/18/2014   DDD (degenerative disc disease), lumbosacral 12/18/2014   Neuropathy due to secondary diabetes ([place]) 12/18/2014   Migraine 12/18/2014   Hypercholesterolemia without hypertriglyceridemia 08/28/2014   HPV test positive 07/17/2014   Class 2 obesity 04/15/2014   CAD in native artery 04/15/2014   Breathlessness on exertion 03/24/2014   Wound infection complicating hardware ([place]) 02/25/2014   Nausea alone 02/01/2014   Abdominal pain, unspecified site 02/01/2014   Severe sepsis(995.92) 01/30/2014   History of lumbar laminectomy 01/30/2014   Acute renal failure ([place]) 01/30/2014   Anemia 01/30/2014   HTN (hypertension) 01/30/2014   DM (diabetes mellitus), type 2 with renal complications ([place]) 48/25/0037   Wound infection 01/25/2014   Acute respiratory failure with hypoxia  ([place]) 01/25/2014   Type 2 diabetes mellitus treated with insulin ([place]) 01/13/2014   Essential (primary) hypertension 01/13/2014   Difficulty speaking 01/13/2014   Clinical depression 01/13/2014   Insulin dependent type 2 diabetes mellitus ([place]) 01/13/2014   Back pain 11/01/2013   HNP (herniated nucleus pulposus), lumbar 10/29/2013    Past Surgical History:  Procedure Laterality Date   ACHILLES TENDON SURGERY Right 04/22/2019   Procedure: Vincente [place] PROCEDURE WITH SUTURE ANCHOR;  Surgeon: Caroline More, DPM;  Location: [place];  Service: Podiatry;  Laterality: Right;   APPENDECTOMY N/A 03/08/2018   Procedure:  APPENDECTOMY;  Surgeon: Benjaman Kindler, MD;  Location: [facility];  Service: Gynecology;  Laterality: N/A;  By Dr. Loreli Dollar SURGERY  2011   Lumbar    BOWEL RESECTION N/A 03/08/2018   Procedure: SMALL BOWEL RESECTION;  Surgeon: Benjaman Kindler, MD;  Location: [facility];  Service: Gynecology;  Laterality: N/A;   CARDIAC CATHETERIZATION  07/18/2004   50-60% mid LAD, minor luminal irregularities RCA, normal LM and CX, EF 50-55% [facility])   carpel tunnel Bilateral    CERVICAL FUSION  2006   CHOLECYSTECTOMY N/A 04/08/2015   Procedure: LAPAROSCOPIC CHOLECYSTECTOMY WITH INTRAOPERATIVE CHOLANGIOGRAM;  Surgeon: Florene Glen, MD;  Location: [facility];  Service: General;  Laterality: N/A;   COLON RESECTION N/A 03/16/2018   Procedure: COLON RESECTION;  Surgeon: Florene Glen, MD;  Location: [facility];  Service: General;  Laterality: N/A;   COLONOSCOPY WITH PROPOFOL N/A 10/22/2019   Procedure: COLONOSCOPY WITH PROPOFOL;  Surgeon: Toledo, Benay Pike, MD;  Location: [facility];  Service: Gastroenterology;  Laterality: N/A;  [place] DID RAPID TEST; COPY ON CHART   ESOPHAGOGASTRODUODENOSCOPY (EGD) WITH PROPOFOL N/A 10/22/2019   Procedure: ESOPHAGOGASTRODUODENOSCOPY (EGD) WITH PROPOFOL;  Surgeon: Toledo, Benay Pike, MD;  Location: [facility];  Service:  Gastroenterology;  Laterality: N/A;   HAMMER TOE SURGERY Right 04/22/2019   Procedure: HAMMER TOE CORRECTION X 4;  Surgeon: Caroline More, DPM;  Location: [place];  Service: Podiatry;  Laterality: Right;  Diabetic - insulin and oral meds   LAPAROSCOPIC GASTRECTOMY  05/31/2017   [facility], Dr. Darnell Level   LUMBAR LAMINECTOMY/DECOMPRESSION MICRODISCECTOMY Right 05/07/2013   Procedure: Right Lumbar one-two laminectomy;  Surgeon: Elaina Hoops, MD;  Location: [place] [facility];  Service: Neurosurgery;  Laterality: Right;   LUMBAR LAMINECTOMY/DECOMPRESSION MICRODISCECTOMY Left 10/29/2013   Procedure: Left Lumbar five-Sacral one Laminectomy;  Surgeon: Elaina Hoops, MD;  Location: [place] [facility]UR[facility];  Service: Neurosurgery;  Laterality: Left;   LUMBAR WOUND DEBRIDEMENT N/A 01/25/2014   Procedure: LUMBAR WOUND DEBRIDEMENT;  Surgeon: Charlie Pitter, MD;  Location: [place] [facility]UR[facility];  Service: Neurosurgery;  Laterality: N/A;   LUMBAR WOUND DEBRIDEMENT N/A 02/25/2014   Procedure: LUMBAR WOUND DEBRIDEMENT;  Surgeon: Elaina Hoops, MD;  Location: [place] [facility];  Service: Neurosurgery;  Laterality: N/A;   LYSIS OF ADHESION N/A 03/08/2018   Procedure: LYSIS OF ADHESION;  Surgeon: Benjaman Kindler, MD;  Location: [facility];  Service: Gynecology;  Laterality: N/A;   SALPINGOOPHORECTOMY Bilateral    1 1997. 2nd 2001   SHOULDER ARTHROSCOPY WITH BICEPSTENOTOMY Left 03/24/2016   Procedure: shoulder arthroscopy with biceps TENOTOMY, removal loose body, limited synovectomy;  Surgeon: Leanor Kail, MD;  Location: [facility];  Service: Orthopedics;  Laterality: Left;   shoulder sugery     7/17 rotator cuff   SMALL BOWEL REPAIR N/A 03/08/2018   Procedure: SMALL BOWEL REPAIR;  Surgeon: Benjaman Kindler, MD;  Location: [facility];  Service: Gynecology;  Laterality: N/A;    Prior to Admission medications   Medication Sig Start Date End Date Taking? Authorizing Provider  cephALEXin (KEFLEX) 500 MG capsule Take 1 capsule (500 mg total) by mouth 4  (four) times daily for 7 days. 02/03/21 02/10/21 Yes Vallarie Mare M, PA-C  fluconazole (DIFLUCAN) 150 MG tablet Take 1 tablet of Diflucan and repeat 3 days later if vaginal itching persist. 02/03/21  Yes Vallarie Mare M, PA-C  ondansetron (ZOFRAN ODT) 4 MG disintegrating tablet Take 1 tablet (4 mg total) by mouth every 8 (eight) hours as  needed for up to 5 days. 02/03/21 02/08/21 Yes Vallarie Mare M, PA-C  acetaminophen (TYLENOL) 500 MG tablet Take 1,000 mg by mouth 2 (two) times daily as needed for moderate pain or headache.    [provider]  amitriptyline (ELAVIL) 50 MG tablet Take 50 mg by mouth at bedtime. Patient not taking: Reported on 12/08/2020    [provider]  atorvastatin (LIPITOR) 80 MG tablet Take 80 mg by mouth at bedtime. 08/21/19   [provider]  BIOTIN PO Take 1 tablet by mouth daily.    [provider]  cyanocobalamin (,VITAMIN B-12,) 1000 MCG/ML injection Inject 1,000 mcg into the muscle every 30 (thirty) days. 12/25/17   [provider]  ergocalciferol (VITAMIN D2) 1.25 MG (50000 UT) capsule Take 50,000 Units by mouth 2 (two) times a week.    [provider]  gabapentin (NEURONTIN) 300 MG capsule Take 600-1,200 mg by mouth See admin instructions. Take 600 mg in the morning and afternoon, then take 1200 mg at bedtime    [provider]  insulin aspart (NOVOLOG FLEXPEN) 100 UNIT/ML FlexPen Inject 10-20 Units into the skin 3 (three) times daily with meals. Sliding scale 02/04/14   Kinnie Feil, MD  meclizine (ANTIVERT) 25 MG tablet Take 1 tablet (25 mg total) by mouth 3 (three) times daily as needed for dizziness. 12/12/17   Hedges, Dellis Filbert, PA-C  Multiple Vitamin (MULTIVITAMIN) tablet Take 1 tablet by mouth daily.    [provider]  NARCAN 4 MG/0.1ML LIQD nasal spray kit Place 1 spray into the nose once. If no response after 2 minutes repeat in opposite nostril. Patient not taking: Reported on 12/08/2020 01/15/18    [provider]  nitroGLYCERIN (NITROSTAT) 0.4 MG SL tablet Place 0.4 mg under the tongue every 5 (five) minutes as needed for chest pain.    [provider]  NON FORMULARY Floradix liquid 1 capful QD    [provider]  oxycodone (ROXICODONE) 30 MG immediate release tablet Take 15 mg by mouth every 4 (four) hours as needed for pain.    [provider]  OZEMPIC, 0.25 OR 0.5 MG/DOSE, 2 MG/1.5ML SOPN INJECT 0.375 MLS (0.5 MG TOTAL) SUBCUTANEOUSLY ONCE A WEEK FOR 30 DAYS 08/27/19   [provider]  pantoprazole (PROTONIX) 40 MG tablet TAKE 1 TABLET (40 MG TOTAL) BY MOUTH 2 (TWO) TIMES DAILY BEFORE MEALS FOR 30 DAYS Patient not taking: Reported on 12/08/2020 09/26/19   [provider]  sitaGLIPtin (JANUVIA) 100 MG tablet Take 100 mg by mouth daily. Patient not taking: Reported on 12/08/2020    [provider]  sucralfate (CARAFATE) 1 GM/10ML suspension Take by mouth. Patient not taking: Reported on 12/08/2020 09/27/19 10/27/19  [provider]  sucralfate (CARAFATE) 1 GM/10ML suspension Take by mouth. Patient not taking: No sig reported    [provider]  tiZANidine (ZANAFLEX) 4 MG tablet Limit 1 tab by mouth per day or 2-3 times per day if tolerated Patient taking differently: Take 4 mg by mouth 3 (three) times daily. 04/19/16   Mohammed Kindle, MD  traZODone (DESYREL) 50 MG tablet Take 50 mg by mouth at bedtime.  Patient not taking: Reported on 12/08/2020 11/26/17   [provider]  triamcinolone cream (KENALOG) 0.1 % Apply 1 application topically 2 (two) times daily. Avoid face, groin, underarms. 02/05/20   Ralene Bathe, MD    Allergies Other, Bactrim [sulfamethoxazole-trimethoprim], Ciprofloxacin, Vancomycin, Amoxicillin, Erythromycin, Nsaids, Azithromycin, Ceftriaxone, and Food  Family History  Problem Relation Age of Onset   Depression Mother    Drug abuse Mother    Early death Mother    Hypertension  Mother    Varicose Veins Mother    CVA Mother    Diabetes Father    Early death Father    Hyperlipidemia Father    Heart Problems Father        enlarged geart   Breast cancer Maternal Grandmother        37's   Breast cancer Maternal Aunt    Lung cancer Maternal Grandfather     Social History Social History   Tobacco Use   Smoking status: Never   Smokeless tobacco: Never  Vaping Use   Vaping Use: Never used  Substance Use Topics   Alcohol use: No   Drug use: No     Review of Systems  Constitutional: No fever/chills Eyes:  No discharge ENT: No upper respiratory complaints. Respiratory: Patient has shortness of breath.  Gastrointestinal: Patient has abdominal discomfort.  Musculoskeletal: Negative for musculoskeletal pain. Skin: Negative for rash, abrasions, lacerations, ecchymosis.    ____________________________________________   PHYSICAL EXAM:  VITAL SIGNS: ED Triage Vitals  Enc Vitals Group     BP 02/03/21 1631 (!) 166/98     Pulse Rate 02/03/21 1631 (!) 110     Resp 02/03/21 1631 18     Temp 02/03/21 1631 98.7 F (37.1 C)     Temp Source 02/03/21 1631 Oral     SpO2 02/03/21 1631 100 %     Weight 02/03/21 1632 158 lb 4.6 oz (71.8 kg)     Height 02/03/21 1632 $RemoveBefor'5\' 5"'ovhgYCdVQKjf$  (1.651 m)     Head Circumference --      Peak Flow --      Pain Score 02/03/21 1632 7     Pain Loc --      Pain Edu? --      Excl. in [place]? --      Constitutional: Alert and oriented. Well appearing and in no acute distress. Eyes: Conjunctivae are normal. PERRL. EOMI. Head: Atraumatic. ENT:      Ears: TMs are pearly.       Nose: No congestion/rhinnorhea.      Mouth/Throat: Mucous membranes are moist.  Neck: No stridor.  No cervical spine tenderness to palpation. Cardiovascular: Normal rate, regular rhythm. Normal S1 and S2.  Good peripheral circulation. Respiratory: Normal respiratory effort without tachypnea or retractions. Lungs CTAB. Good air entry to the bases with no decreased or  absent breath sounds Gastrointestinal: Bowel sounds x 4 quadrants. Soft and nontender to palpation. No guarding or rigidity. No distention. Musculoskeletal: Full range of motion to all extremities. No obvious deformities noted Neurologic:  Normal for age. No gross focal neurologic deficits are appreciated.  Skin: Patient's panniculectomy incisions are well visualized.  Patient has a 1 cm area of dehiscence with expression of serosanguineous exudate.  No evidence of purulence.  No surrounding cellulitis. Psychiatric: Mood and affect are normal for age. Speech and behavior are normal.   ____________________________________________   LABS (all labs ordered are listed, but only abnormal results are displayed)  Labs Reviewed  CBC WITH DIFFERENTIAL/PLATELET - Abnormal; Notable for the following components:      Result Value   RBC 3.50 (*)    Hemoglobin 10.1 (*)    HCT 32.1 (*)    RDW 19.6 (*)    All other components within normal limits  COMPREHENSIVE METABOLIC PANEL - Abnormal; Notable for the following components:  Glucose, Bld 138 (*)    Anion gap 3 (*)    All other components within normal limits  D-DIMER, QUANTITATIVE - Abnormal; Notable for the following components:   D-Dimer, Quant 0.95 (*)    All other components within normal limits  URINALYSIS, COMPLETE (UACMP) WITH MICROSCOPIC - Abnormal; Notable for the following components:   Color, Urine YELLOW (*)    APPearance CLEAR (*)    Specific Gravity, Urine >1.046 (*)    All other components within normal limits  RESP PANEL BY RT-PCR (FLU A&B, COVID) ARPGX2  LACTIC ACID, PLASMA  LACTIC ACID, PLASMA  TROPONIN I (HIGH SENSITIVITY)  TROPONIN I (HIGH SENSITIVITY)   ____________________________________________  EKG   ____________________________________________  RADIOLOGY Unk Pinto, personally viewed and evaluated these images (plain radiographs) as part of my medical decision making, as well as reviewing the written  report by the radiologist.    DG Chest 2 View  Result Date: 02/03/2021 CLINICAL DATA:  Shortness of breath EXAM: CHEST - 2 VIEW COMPARISON:  06/16/2020 FINDINGS: The heart size and mediastinal contours are within normal limits. Both lungs are clear. Surgical hardware in the cervical spine. Degenerative changes. IMPRESSION: No active cardiopulmonary disease. Electronically Signed   By: Donavan Foil M.D.   On: 02/03/2021 17:07   CT Angio Chest PE W and/or Wo Contrast  Result Date: 02/03/2021 CLINICAL DATA:  Shortness of breath recent lipoma removal EXAM: CT ANGIOGRAPHY CHEST WITH CONTRAST TECHNIQUE: Multidetector CT imaging of the chest was performed using the standard protocol during bolus administration of intravenous contrast. Multiplanar CT image reconstructions and MIPs were obtained to evaluate the vascular anatomy. CONTRAST:  41m OMNIPAQUE IOHEXOL 350 MG/ML SOLN COMPARISON:  Chest x-ray 02/03/2021, CT chest 06/16/2020 FINDINGS: Cardiovascular: Satisfactory opacification of the pulmonary arteries to the segmental level. No evidence of pulmonary embolism. Nonaneurysmal aorta. No dissection seen. Normal cardiac size. No pericardial effusion Mediastinum/Nodes: No enlarged mediastinal, hilar, or axillary lymph nodes. Thyroid gland, trachea, and esophagus demonstrate no significant findings. Lungs/Pleura: Lungs are clear. No pleural effusion or pneumothorax. Upper Abdomen: No acute finding Musculoskeletal: No chest wall abnormality. No acute or significant osseous findings. Review of the MIP images confirms the above findings. IMPRESSION: Negative for acute pulmonary embolus or aortic dissection. Clear lung fields. Aortic Atherosclerosis (ICD10-I70.0). Electronically Signed   By: KDonavan FoilM.D.   On: 02/03/2021 19:16   CT ABDOMEN PELVIS W CONTRAST  Result Date: 02/03/2021 CLINICAL DATA:  Recent lipoma removal with wound problem EXAM: CT ABDOMEN AND PELVIS WITH CONTRAST TECHNIQUE: Multidetector CT  imaging of the abdomen and pelvis was performed using the standard protocol following bolus administration of intravenous contrast. CONTRAST:  760mOMNIPAQUE IOHEXOL 350 MG/ML SOLN COMPARISON:  CT 04/13/2020, 03/08/2020, 04/06/2018 FINDINGS: Lower chest: Lung bases demonstrate no acute consolidation or effusion. Normal cardiac size. Hepatobiliary: No focal liver abnormality is seen. Status post cholecystectomy. No biliary dilatation. Pancreas: Unremarkable. No pancreatic ductal dilatation or surrounding inflammatory changes. Spleen: Normal in size without focal abnormality. Adrenals/Urinary Tract: Right adrenal gland is normal. Stable 12 mm left adrenal nodule likely an adenoma. Kidneys show no hydronephrosis. Stable 9 mm hypodense lesion mid pole right kidney. Additional subcentimeter renal hypodensities too small to further characterize. The bladder is unremarkable Stomach/Bowel: Postsurgical changes of the stomach. No dilated small bowel. Postsurgical changes pelvic small bowel. No obstruction. No acute bowel wall thickening. Vascular/Lymphatic: Moderate to marked aortic atherosclerosis without aneurysm. No suspicious nodes. Reproductive: Uterus and bilateral adnexa are unremarkable. Other: No free air  or significant pelvic effusion. Rim enhancing supraumbilical ventral fluid collection measuring 5.9 cm transverse by 11 mm AP by 8.3 cm craniocaudad, extends from sub xiphoid region to the level of the umbilicus. Generalized soft tissue stranding within the anterior abdominal fat. This is probably contiguous with infraumbilical fluid collection that communicates with the skin surface, series 2, image 59. Large rim enhancing infraumbilical fluid collection with isolated gas bubble measures 12.2 cm transverse by 4.5 cm AP by 10.1 cm craniocaudad. This larger fluid collection has a component that tracks laterally to the left, anterior to the iliac crest and into the left lateral and posterolateral abdominal wall soft  tissues. Possible skin communication, coronal series 5, image 48. Additional irregular fluid collection within the left posterior upper gluteal/low back soft tissues measuring 5.7 x 2 cm, series 2, image 55. Musculoskeletal: Posterior spinal fusion hardware L5-S1. No acute osseous abnormality. IMPRESSION: 1. Infraumbilical ventral wound that is contiguous with large subcutaneous supraumbilical and infraumbilical rim enhancing fluid collections. The infraumbilical fluid collection extends laterally to the left hip soft tissues. Moderate surrounding inflammatory changes. Findings could represent inflammatory fluid collections versus soft tissue abscess. 2. Separate irregular fluid collection within the left low back/upper gluteal posterior subcutaneous soft tissues. 3. Otherwise no CT evidence for acute intra-abdominal or pelvic abnormality. Electronically Signed   By: Donavan Foil M.D.   On: 02/03/2021 19:37    ____________________________________________    PROCEDURES  Procedure(s) performed:     Procedures     Medications  acetaminophen (TYLENOL) tablet 650 mg (650 mg Oral Given 02/03/21 1711)  iohexol (OMNIPAQUE) 350 MG/ML injection 75 mL (75 mLs Intravenous Contrast Given 02/03/21 1853)     ____________________________________________   INITIAL IMPRESSION / ASSESSMENT AND PLAN / ED COURSE  Pertinent labs & imaging results that were available during my care of the patient were reviewed by me and considered in my medical decision making (see chart for details).      Assessment and plan Shortness of breath Nausea Weakness 45 year old female presents to the emergency department with progressively worsening shortness of breath, weakness, fatigue and nausea since having panniculectomy and lipoma excision on June 3.  Patient was tachycardic at triage and hypertensive vital signs otherwise reassuring.  Patient was satting at 100% on room air.  Patient did seem  fatigued.  Differential diagnosis includes post viral pneumonia, PE, sepsis, atelectasis, intra-abdominal abscess, UTI...  CBC and CMP were reassuring.  D-dimer was elevated.  Troponin was within reference range.  EKG indicated sinus tachycardia but no ST segment elevation or other apparent arrhythmia.  CTA showed no evidence of PE.  I did obtain repeat CT abdomen pelvis as patient was also complained of worsening abdominal discomfort and nausea.  A 10.2 cm infraumbilical fluid collection was identified on CT Abdomen and Pelvis which is consistent with CT imaging obtained on [facility]/24/20[facility].  I did reach out to our general surgeon on-call, Dr. Hampton Abbot to have him review CT abdomen pelvis results as it is difficult to rule out intraabdominal abscess.  He was concerned the patient might need outpatient antibiotics until she can be evaluated by her bariatric team.  He did not feel that an emergent surgical intervention was warranted at this time but suggested that patient might need aspiration of her seroma in the future.  I reached out to [facility] bariatric surgical team and discussed patient's case with Link Snuffer, PA-C.  Mr. Duke Salvia is familiar with Ms.Garciaperez's surgical case and agrees with discharging patient with Keflex.  He feels that patient is appropriate for outpatient follow-up at this time.   ____________________________________________  FINAL CLINICAL IMPRESSION(S) / ED DIAGNOSES  Final diagnoses:  Shortness of breath  Abdominal discomfort  Nausea      NEW MEDICATIONS STARTED DURING THIS VISIT:  ED Discharge Orders          Ordered    cephALEXin (KEFLEX) 500 MG capsule  4 times daily        02/03/21 2127    ondansetron (ZOFRAN ODT) 4 MG disintegrating tablet  Every 8 hours PRN        02/03/21 2127    fluconazole (DIFLUCAN) 150 MG tablet        02/03/21 2127                This chart was dictated using voice recognition software/Dragon. Despite best  efforts to proofread, errors can occur which can change the meaning. Any change was purely unintentional.     Karren Cobble 02/03/21 2232    Lavonia Drafts, MD 02/22/21 [phone]

## 2021-02-03 NOTE — ED Notes (Signed)
Pt is requesting Tylenol

## 2021-02-03 NOTE — Discharge Instructions (Addendum)
Take Keflex 4 times daily for the next 7 days. You have also been prescribed Diflucan and Zofran if nausea occurs. Please follow-up with your bariatric specialist.

## 2021-02-04 ENCOUNTER — Inpatient Hospital Stay: Payer: 59

## 2021-02-04 VITALS — BP 137/91 | HR 90 | Temp 96.1°F | Resp 20

## 2021-02-04 DIAGNOSIS — D509 Iron deficiency anemia, unspecified: Secondary | ICD-10-CM

## 2021-02-04 MED ORDER — SODIUM CHLORIDE 0.9 % IV SOLN
Freq: Once | INTRAVENOUS | Status: AC
  Filled 2021-02-04: qty 250

## 2021-02-04 MED ORDER — SODIUM CHLORIDE 0.9 % IV SOLN: 200.0000 mg | Freq: Once | INTRAVENOUS | Status: DC

## 2021-02-04 MED ORDER — IRON SUCROSE 20 MG/ML IV SOLN
200.0000 mg | Freq: Once | INTRAVENOUS | Status: AC
Start: 2021-02-04 — End: 2021-02-04
  Administered 2021-02-04: 200 mg via INTRAVENOUS
  Filled 2021-02-04: qty 10

## 2021-02-04 NOTE — Patient Instructions (Signed)
[facility]  Discharge Instructions: Thank you for choosing [facility] to provide your oncology and hematology care.  If you have a lab appointment with the Cancer Center, please go directly to the Cancer Center and check in at the registration area.  Wear comfortable clothing and clothing appropriate for easy access to any Portacath or PICC line.   We strive to give you quality time with your provider. You may need to reschedule your appointment if you arrive late (15 or more minutes).  Arriving late affects you and other patients whose appointments are after yours.  Also, if you miss three or more appointments without notifying the office, you may be dismissed from the clinic at the provider's discretion.      For prescription refill requests, have your pharmacy contact our office and allow 72 hours for refills to be completed.    Today you received the following chemotherapy and/or immunotherapy agents:  Venofer   To help prevent nausea and vomiting after your treatment, we encourage you to take your nausea medication as directed.  BELOW ARE SYMPTOMS THAT SHOULD BE REPORTED IMMEDIATELY: *FEVER GREATER THAN 100.4 F (38 C) OR HIGHER *CHILLS OR SWEATING *NAUSEA AND VOMITING THAT IS NOT CONTROLLED WITH YOUR NAUSEA MEDICATION *UNUSUAL SHORTNESS OF BREATH *UNUSUAL BRUISING OR BLEEDING *URINARY PROBLEMS (pain or burning when urinating, or frequent urination) *BOWEL PROBLEMS (unusual diarrhea, constipation, pain near the anus) TENDERNESS IN MOUTH AND THROAT WITH OR WITHOUT PRESENCE OF ULCERS (sore throat, sores in mouth, or a toothache) UNUSUAL RASH, SWELLING OR PAIN  UNUSUAL VAGINAL DISCHARGE OR ITCHING   Items with * indicate a potential emergency and should be followed up as soon as possible or go to the Emergency Department if any problems should occur.  Please show the CHEMOTHERAPY ALERT CARD or IMMUNOTHERAPY ALERT CARD at check-in to  the Emergency Department and triage nurse.  Should you have questions after your visit or need to cancel or reschedule your appointment, please contact [facility]  [phone] and follow the prompts.  Office hours are 8:00 a.m. to 4:30 p.m. Monday - Friday. Please note that voicemails left after 4:00 p.m. may not be returned until the following business day.  We are closed weekends and major holidays. You have access to a nurse at all times for urgent questions. Please call the main number to the clinic [phone] and follow the prompts.  For any non-urgent questions, you may also contact your provider using MyChart. We now offer e-Visits for anyone 18 and older to request care online for non-urgent symptoms. For details visit [email or web address].   Also download the MyChart app! Go to the app store, search "MyChart", open the app, select [facility], and log in with your MyChart username and password.  Due to Covid, a mask is required upon entering the hospital/clinic. If you do not have a mask, one will be given to you upon arrival. For doctor visits, patients may have 1 support person aged 18 or older with them. For treatment visits, patients cannot have anyone with them due to current Covid guidelines and our immunocompromised population.  

## 2021-02-22 ENCOUNTER — Inpatient Hospital Stay: Payer: 59 | Admitting: Oncology

## 2021-02-22 ENCOUNTER — Inpatient Hospital Stay: Payer: 59

## 2021-02-24 ENCOUNTER — Ambulatory Visit: Payer: 59

## 2021-02-24 ENCOUNTER — Ambulatory Visit: Payer: 59 | Admitting: Nurse Practitioner

## 2021-02-24 ENCOUNTER — Other Ambulatory Visit: Payer: 59

## 2021-03-02 ENCOUNTER — Inpatient Hospital Stay: Payer: 59 | Attending: Oncology

## 2021-03-02 ENCOUNTER — Inpatient Hospital Stay (HOSPITAL_BASED_OUTPATIENT_CLINIC_OR_DEPARTMENT_OTHER): Payer: 59 | Admitting: Oncology

## 2021-03-02 ENCOUNTER — Inpatient Hospital Stay: Payer: 59

## 2021-03-02 ENCOUNTER — Encounter: Payer: Self-pay | Admitting: Oncology

## 2021-03-02 VITALS — BP 148/94 | HR 92 | Temp 98.0°F | Resp 16 | Wt 151.3 lb

## 2021-03-02 DIAGNOSIS — D509 Iron deficiency anemia, unspecified: Secondary | ICD-10-CM

## 2021-03-02 DIAGNOSIS — Z803 Family history of malignant neoplasm of breast: Secondary | ICD-10-CM | POA: Diagnosis not present

## 2021-03-02 DIAGNOSIS — Z9049 Acquired absence of other specified parts of digestive tract: Secondary | ICD-10-CM | POA: Diagnosis not present

## 2021-03-02 DIAGNOSIS — D500 Iron deficiency anemia secondary to blood loss (chronic): Secondary | ICD-10-CM | POA: Diagnosis not present

## 2021-03-02 DIAGNOSIS — Z880 Allergy status to penicillin: Secondary | ICD-10-CM | POA: Insufficient documentation

## 2021-03-02 DIAGNOSIS — D508 Other iron deficiency anemias: Secondary | ICD-10-CM | POA: Insufficient documentation

## 2021-03-02 DIAGNOSIS — Z833 Family history of diabetes mellitus: Secondary | ICD-10-CM | POA: Diagnosis not present

## 2021-03-02 DIAGNOSIS — I252 Old myocardial infarction: Secondary | ICD-10-CM | POA: Diagnosis not present

## 2021-03-02 DIAGNOSIS — I700 Atherosclerosis of aorta: Secondary | ICD-10-CM | POA: Diagnosis not present

## 2021-03-02 DIAGNOSIS — R42 Dizziness and giddiness: Secondary | ICD-10-CM | POA: Insufficient documentation

## 2021-03-02 DIAGNOSIS — Z90721 Acquired absence of ovaries, unilateral: Secondary | ICD-10-CM | POA: Diagnosis not present

## 2021-03-02 DIAGNOSIS — Z886 Allergy status to analgesic agent status: Secondary | ICD-10-CM | POA: Insufficient documentation

## 2021-03-02 DIAGNOSIS — R5383 Other fatigue: Secondary | ICD-10-CM | POA: Diagnosis not present

## 2021-03-02 DIAGNOSIS — Z9884 Bariatric surgery status: Secondary | ICD-10-CM | POA: Diagnosis not present

## 2021-03-02 DIAGNOSIS — Z881 Allergy status to other antibiotic agents status: Secondary | ICD-10-CM | POA: Insufficient documentation

## 2021-03-02 DIAGNOSIS — Z801 Family history of malignant neoplasm of trachea, bronchus and lung: Secondary | ICD-10-CM | POA: Insufficient documentation

## 2021-03-02 DIAGNOSIS — Z8349 Family history of other endocrine, nutritional and metabolic diseases: Secondary | ICD-10-CM | POA: Diagnosis not present

## 2021-03-02 DIAGNOSIS — R0602 Shortness of breath: Secondary | ICD-10-CM | POA: Diagnosis not present

## 2021-03-02 DIAGNOSIS — Z9641 Presence of insulin pump (external) (internal): Secondary | ICD-10-CM | POA: Diagnosis not present

## 2021-03-02 DIAGNOSIS — E119 Type 2 diabetes mellitus without complications: Secondary | ICD-10-CM | POA: Diagnosis not present

## 2021-03-02 DIAGNOSIS — R531 Weakness: Secondary | ICD-10-CM | POA: Insufficient documentation

## 2021-03-02 DIAGNOSIS — Z814 Family history of other substance abuse and dependence: Secondary | ICD-10-CM | POA: Insufficient documentation

## 2021-03-02 DIAGNOSIS — Z818 Family history of other mental and behavioral disorders: Secondary | ICD-10-CM | POA: Diagnosis not present

## 2021-03-02 DIAGNOSIS — Z79899 Other long term (current) drug therapy: Secondary | ICD-10-CM | POA: Insufficient documentation

## 2021-03-02 DIAGNOSIS — Z903 Acquired absence of stomach [part of]: Secondary | ICD-10-CM

## 2021-03-02 DIAGNOSIS — Z981 Arthrodesis status: Secondary | ICD-10-CM | POA: Diagnosis not present

## 2021-03-02 DIAGNOSIS — Z8249 Family history of ischemic heart disease and other diseases of the circulatory system: Secondary | ICD-10-CM | POA: Insufficient documentation

## 2021-03-02 LAB — CBC WITH DIFFERENTIAL/PLATELET
Abs Immature Granulocytes: 0.02 10*3/uL (ref 0.00–0.07)
Basophils Absolute: 0 10*3/uL (ref 0.0–0.1)
Basophils Relative: 0 %
Eosinophils Absolute: 0 10*3/uL (ref 0.0–0.5)
Eosinophils Relative: 1 %
HCT: 40.8 % (ref 36.0–46.0)
Hemoglobin: 13 g/dL (ref 12.0–15.0)
Immature Granulocytes: 0 %
Lymphocytes Relative: 19 %
Lymphs Abs: 0.9 10*3/uL (ref 0.7–4.0)
MCH: 29.5 pg (ref 26.0–34.0)
MCHC: 31.9 g/dL (ref 30.0–36.0)
MCV: 92.5 fL (ref 80.0–100.0)
Monocytes Absolute: 0.3 10*3/uL (ref 0.1–1.0)
Monocytes Relative: 5 %
Neutro Abs: 3.5 10*3/uL (ref 1.7–7.7)
Neutrophils Relative %: 75 %
Platelets: 180 10*3/uL (ref 150–400)
RBC: 4.41 MIL/uL (ref 3.87–5.11)
RDW: 15 % (ref 11.5–15.5)
WBC: 4.7 10*3/uL (ref 4.0–10.5)
nRBC: 0 % (ref 0.0–0.2)

## 2021-03-02 LAB — IRON AND TIBC
Iron: 77 ug/dL (ref 28–170)
Saturation Ratios: 20 % (ref 10.4–31.8)
TIBC: 377 ug/dL (ref 250–450)
UIBC: 300 ug/dL

## 2021-03-02 LAB — FERRITIN: Ferritin: 148 ng/mL (ref 11–307)

## 2021-03-02 NOTE — Progress Notes (Signed)
Hematology/Oncology follow up  note [facility] Telephone:(336) [phone] Fax:(336) [phone]   Patient Care Team: [name], MD as PCP - General (Family Medicine) Theodis Sato, Gertha Calkin, MD as Consulting Physician (Cardiology) Kary Kos, MD as Consulting Physician (Neurosurgery) Earlie Server, MD as Consulting Physician (Hematology and Oncology)  REFERRING PROVIDER: [name], MD CHIEF COMPLAINTS/REASON FOR VISIT:  Follow up of iron deficiency anemia  HISTORY OF PRESENTING ILLNESS:  Marissa [name] is a  45 y.o.  female with PMH listed below was seen in consultation at the request of [name], MD  for evaluation of iron deficiency anemia.   Reviewed patient's recent labs  09/23/2019 labs revealed anemia with hemoglobin of 9.4, MCV 78.7.  Patient was referred to [facility] emergency room on 09/24/2019 for evaluation and IV iron infusion.  Patient reports approximately 4 weeks.  Of generalized fatigue, dizziness and feeling cold. Reviewed patient's previous labs ordered by primary care physician's office, anemia is chronic onset, since at least 2014. I will have any recent iron panel.  02/01/2024 iron saturation 10, ferritin 106. Patient received IV Feraheme and he tolerates well. 09/27/2019, patient went to [facility] ER for evaluation of epigastric pain.  She had CT abdomen pelvis done which showed no acute pathology within the abdomen or pelvis. Associated signs and symptoms: Patient reports fatigue, dizziness and feeling cold.  Denies SOB with exertion.  Denies weight loss, easy bruising, hematochezia, hemoptysis, hematuria. Context:  History of iron deficiency:  Rectal bleeding: Denies Menstrual bleeding/ Vaginal bleeding : LMP 12/21/2014, history of salpingo-oophorectomy bilateral. Hematemesis or hemoptysis : denies Blood in urine : denies  Last endoscopy: Patient had a history of laparoscopic gastric sleeve  April 2021 at [facility] due to abdominal pain,  AKI. and had enteroscopy done on 12/11/2019. No specimen was collected. The area of ulceration and stenosis could not be reached with this antegrade approach. 12/15/2019 enteroscopy examined portion of the ileum was normal, diverticulosis in the left colon.  01/07/2020 he was seen by Dr.Bruce-Bariatric Surgery.  Per patient there was plan of laparoscopic adhesion lysis procedure.    INTERVAL HISTORY Marissa [name] is a 45 y.o. female who has above history reviewed by me today presents for follow up visit for management of iron deficiency anemia 01/14/2021, patient had abdominal wall lipoma excision.  Patient experienced chronic ongoing bleeding from the surgical incision.  She  had work-up there including a CT scan which showed large fluid collection consistent with seroma versus hematoma.  Patient's hemoglobin also dropped to 8.9.  Patient reports feeling extremely weak and fatigued and was seen by symptom management clinic Beckey Rutter. Patient was given IV Venofer treatments.  Today she presents for follow-up. She continues to have some oozing of the blood from her surgery sites.  She follows up with surgeon. Fatigue has much improved since last visit.  No fever, chills, abdominal pain, nausea vomiting.    Review of Systems  Constitutional:  Negative for appetite change, chills, fatigue and fever.  HENT:   Negative for hearing loss and voice change.   Eyes:  Negative for eye problems.  Respiratory:  Negative for chest tightness, cough and shortness of breath.   Cardiovascular:  Negative for chest pain.  Gastrointestinal:  Negative for abdominal distention, abdominal pain and blood in stool.  Endocrine: Negative for hot flashes.  Genitourinary:  Negative for difficulty urinating and frequency.   Musculoskeletal:  Negative for arthralgias.  Skin:  Negative for itching and rash.  Neurological:  Negative for extremity  weakness.  Hematological:  Negative for adenopathy.  Psychiatric/Behavioral:   Negative for confusion.    MEDICAL HISTORY:  Past Medical History:  Diagnosis Date   Allergy    Anemia    Anxiety    Arthritis    Back pain, chronic    Cardiomyopathy, dilated, nonischemic (HCC)    CHF (congestive heart failure) (HCC)    Cholelithiasis    Coronary artery disease    Depression    Diabetes mellitus without complication (HCC)    fasting cbg 50-140s   Dysrhythmia    tachycardia   GERD (gastroesophageal reflux disease)    otc meds   Headache(784.0)    Hypercholesteremia    Hyperlipemia    Hypertension    IDA (iron deficiency anemia) 09/29/2019   Insulin pump in place    pt had insulin pump but it is now removed (02-18-16)   MI, old    2006   Migraines    once/month maybe - can last up to two weeks   Myocardial infarction [facility]) 2006   "due to medication"; no evidence of ischemia or infarction by nuclear stress test '11   Neck pain, chronic    Neuropathy    legs   Pneumonia 2015   [facility]   Prolonged QT interval syndrome    Renal insufficiency    Restless leg syndrome, controlled    Sepsis ([place]) 2016   secondary to surgery   Sleep apnea    does not use cpap since losing alot of weight   Vertigo    nop episodes 2020   Vision loss    due to diabetes   Wears contact lenses    Wears dentures    partial lower    SURGICAL HISTORY: Past Surgical History:  Procedure Laterality Date   ACHILLES TENDON SURGERY Right 04/22/2019   Procedure: [facility] PROCEDURE WITH SUTURE ANCHOR;  Surgeon: Caroline More, DPM;  Location: [place];  Service: Podiatry;  Laterality: Right;   APPENDECTOMY N/A 03/08/2018   Procedure: APPENDECTOMY;  Surgeon: Benjaman Kindler, MD;  Location: [facility] ORS;  Service: Gynecology;  Laterality: N/A;  By Dr. Loreli Dollar SURGERY  2011   Lumbar    BOWEL RESECTION N/A 03/08/2018   Procedure: SMALL BOWEL RESECTION;  Surgeon: Benjaman Kindler, MD;  Location: [facility] ORS;  Service: Gynecology;  Laterality: N/A;   CARDIAC CATHETERIZATION   07/18/2004   50-60% mid LAD, minor luminal irregularities RCA, normal LM and CX, EF 50-55% [facility])   carpel tunnel Bilateral    CERVICAL FUSION  2006   CHOLECYSTECTOMY N/A 04/08/2015   Procedure: LAPAROSCOPIC CHOLECYSTECTOMY WITH INTRAOPERATIVE CHOLANGIOGRAM;  Surgeon: Florene Glen, MD;  Location: [facility] OR[facility];  Service: General;  Laterality: N/A;   COLON RESECTION N/A 03/16/2018   Procedure: COLON RESECTION;  Surgeon: Florene Glen, MD;  Location: [facility];  Service: General;  Laterality: N/A;   COLONOSCOPY WITH PROPOFOL N/A 10/22/2019   Procedure: COLONOSCOPY WITH PROPOFOL;  Surgeon: Toledo, Benay Pike, MD;  Location: [facility] ENDOSCOPY;  Service: Gastroenterology;  Laterality: N/A;  [place] DID RAPID TEST; COPY ON CHART   ESOPHAGOGASTRODUODENOSCOPY (EGD) WITH PROPOFOL N/A 10/22/2019   Procedure: ESOPHAGOGASTRODUODENOSCOPY (EGD) WITH PROPOFOL;  Surgeon: Toledo, Benay Pike, MD;  Location: [facility] ENDOSCOPY;  Service: Gastroenterology;  Laterality: N/A;   HAMMER TOE SURGERY Right 04/22/2019   Procedure: HAMMER TOE CORRECTION X 4;  Surgeon: Caroline More, DPM;  Location: [place];  Service: Podiatry;  Laterality: Right;  Diabetic - insulin and oral  meds   LAPAROSCOPIC GASTRECTOMY  05/31/2017   [facility], Dr. Darnell Level   LUMBAR LAMINECTOMY/DECOMPRESSION MICRODISCECTOMY Right 05/07/2013   Procedure: Right Lumbar one-two laminectomy;  Surgeon: Elaina Hoops, MD;  Location: [place] NEURO ORS;  Service: Neurosurgery;  Laterality: Right;   LUMBAR LAMINECTOMY/DECOMPRESSION MICRODISCECTOMY Left 10/29/2013   Procedure: Left Lumbar five-Sacral one Laminectomy;  Surgeon: Elaina Hoops, MD;  Location: [place] NEURO ORS;  Service: Neurosurgery;  Laterality: Left;   LUMBAR WOUND DEBRIDEMENT N/A 01/25/2014   Procedure: LUMBAR WOUND DEBRIDEMENT;  Surgeon: Charlie Pitter, MD;  Location: [place] NEURO ORS;  Service: Neurosurgery;  Laterality: N/A;   LUMBAR WOUND DEBRIDEMENT N/A 02/25/2014   Procedure: LUMBAR WOUND DEBRIDEMENT;   Surgeon: Elaina Hoops, MD;  Location: [place] NEURO ORS;  Service: Neurosurgery;  Laterality: N/A;   LYSIS OF ADHESION N/A 03/08/2018   Procedure: LYSIS OF ADHESION;  Surgeon: Benjaman Kindler, MD;  Location: [facility];  Service: Gynecology;  Laterality: N/A;   SALPINGOOPHORECTOMY Bilateral    1 1997. 2nd 2001   SHOULDER ARTHROSCOPY WITH BICEPSTENOTOMY Left 03/24/2016   Procedure: shoulder arthroscopy with biceps TENOTOMY, removal loose body, limited synovectomy;  Surgeon: Leanor Kail, MD;  Location: [facility] ORS;  Service: Orthopedics;  Laterality: Left;   shoulder sugery     7/17 rotator cuff   SMALL BOWEL REPAIR N/A 03/08/2018   Procedure: SMALL BOWEL REPAIR;  Surgeon: Benjaman Kindler, MD;  Location: [facility] OR[facility];  Service: Gynecology;  Laterality: N/A;    SOCIAL HISTORY: Social History   Socioeconomic History   Marital status: Married    Spouse name: Not on file   Number of children: 0   Years of education: College   Highest education level: Not on file  Occupational History   Occupation: Glass blower/designer  Tobacco Use   Smoking status: Never   Smokeless tobacco: Never  Vaping Use   Vaping Use: Never used  Substance and Sexual Activity   Alcohol use: No   Drug use: No   Sexual activity: Yes    Birth control/protection: Surgical  Other Topics Concern   Not on file  Social History Narrative   Lives at home with her fiance.   Right-handed.   Occasional caffeine use.   Social Determinants of Health   Financial Resource Strain: Not on file  Food Insecurity: Not on file  Transportation Needs: Not on file  Physical Activity: Not on file  Stress: Not on file  Social Connections: Not on file  Intimate Partner Violence: Not on file    FAMILY HISTORY: Family History  Problem Relation Age of Onset   Depression Mother    Drug abuse Mother    Early death Mother    Hypertension Mother    Varicose Veins Mother    CVA Mother    Diabetes Father    Early death Father    Hyperlipidemia  Father    Heart Problems Father        enlarged geart   Breast cancer Maternal Grandmother        60's   Breast cancer Maternal Aunt    Lung cancer Maternal Grandfather     ALLERGIES:  is allergic to other, bactrim [sulfamethoxazole-trimethoprim], ciprofloxacin, vancomycin, amoxicillin, erythromycin, nsaids, azithromycin, ceftriaxone, and food.  MEDICATIONS:  Current Outpatient Medications  Medication Sig Dispense Refill   acetaminophen (TYLENOL) 500 MG tablet Take 1,000 mg by mouth 2 (two) times daily as needed for moderate pain or headache.     atorvastatin (LIPITOR) 80 MG tablet Take 80  mg by mouth at bedtime.     BIOTIN PO Take 1 tablet by mouth daily.     cyanocobalamin (,VITAMIN B-12,) 1000 MCG/ML injection Inject 1,000 mcg into the muscle every 30 (thirty) days.  3   ergocalciferol (VITAMIN D2) 1.25 MG (50000 UT) capsule Take 50,000 Units by mouth 2 (two) times a week.     fluconazole (DIFLUCAN) 150 MG tablet Take 1 tablet of Diflucan and repeat 3 days later if vaginal itching persist. 2 tablet 0   gabapentin (NEURONTIN) 300 MG capsule Take 600-1,200 mg by mouth See admin instructions. Take 600 mg in the morning and afternoon, then take 1200 mg at bedtime     insulin aspart (NOVOLOG FLEXPEN) 100 UNIT/ML FlexPen Inject 10-20 Units into the skin 3 (three) times daily with meals. Sliding scale 15 mL 11   meclizine (ANTIVERT) 25 MG tablet Take 1 tablet (25 mg total) by mouth 3 (three) times daily as needed for dizziness. 30 tablet 0   Multiple Vitamin (MULTIVITAMIN) tablet Take 1 tablet by mouth daily.     nitroGLYCERIN (NITROSTAT) 0.4 MG SL tablet Place 0.4 mg under the tongue every 5 (five) minutes as needed for chest pain.     NON FORMULARY Floradix liquid 1 capful QD     oxycodone (ROXICODONE) 30 MG immediate release tablet Take 15 mg by mouth every 4 (four) hours as needed for pain.     OZEMPIC, 0.25 OR 0.5 MG/DOSE, 2 MG/1.5ML SOPN INJECT 0.375 MLS (0.5 MG TOTAL) SUBCUTANEOUSLY  ONCE A WEEK FOR 30 DAYS     triamcinolone cream (KENALOG) 0.1 % Apply 1 application topically 2 (two) times daily. Avoid face, groin, underarms. 80 g 2   amitriptyline (ELAVIL) 50 MG tablet Take 50 mg by mouth at bedtime. (Patient not taking: Reported on 12/08/2020)     NARCAN 4 MG/0.1ML LIQD nasal spray kit Place 1 spray into the nose once. If no response after 2 minutes repeat in opposite nostril. (Patient not taking: Reported on 12/08/2020)  0   pantoprazole (PROTONIX) 40 MG tablet TAKE 1 TABLET (40 MG TOTAL) BY MOUTH 2 (TWO) TIMES DAILY BEFORE MEALS FOR 30 DAYS (Patient not taking: Reported on 12/08/2020)     sitaGLIPtin (JANUVIA) 100 MG tablet Take 100 mg by mouth daily. (Patient not taking: Reported on 12/08/2020)     sucralfate (CARAFATE) 1 GM/10ML suspension Take by mouth. (Patient not taking: Reported on 12/08/2020)     sucralfate (CARAFATE) 1 GM/10ML suspension Take by mouth. (Patient not taking: No sig reported)     tiZANidine (ZANAFLEX) 4 MG tablet Limit 1 tab by mouth per day or 2-3 times per day if tolerated (Patient taking differently: Take 4 mg by mouth 3 (three) times daily.) 90 tablet 0   traZODone (DESYREL) 50 MG tablet Take 50 mg by mouth at bedtime.  (Patient not taking: Reported on 12/08/2020)  1   No current facility-administered medications for this visit.     PHYSICAL EXAMINATION: ECOG PERFORMANCE STATUS: 1 - Symptomatic but completely ambulatory Vitals:   03/02/21 1124  BP: (!) 148/94  Pulse: 92  Resp: 16  Temp: 98 F (36.7 C)  SpO2: 100%   Filed Weights   03/02/21 1124  Weight: 151 lb 4.8 oz (68.6 kg)    Physical Exam Constitutional:      General: She is not in acute distress. HENT:     Head: Normocephalic and atraumatic.  Eyes:     General: No scleral icterus. Cardiovascular:  Rate and Rhythm: Normal rate and regular rhythm.     Heart sounds: Normal heart sounds.  Pulmonary:     Effort: Pulmonary effort is normal. No respiratory distress.     Breath  sounds: No wheezing.  Abdominal:     Comments: Status post excision of lipoma.  Musculoskeletal:        General: No deformity. Normal range of motion.     Cervical back: Normal range of motion and neck supple.  Skin:    General: Skin is warm and dry.     Findings: No erythema or rash.  Neurological:     Mental Status: She is alert and oriented to person, place, and time. Mental status is at baseline.     Cranial Nerves: No cranial nerve deficit.     Coordination: Coordination normal.  Psychiatric:        Mood and Affect: Mood normal.      CMP Latest Ref Rng & Units 02/03/2021  Glucose 70 - 99 mg/dL 138(H)  BUN 6 - 20 mg/dL 13  Creatinine 0.44 - 1.00 mg/dL 0.77  Sodium 135 - 145 mmol/L 142  Potassium 3.5 - 5.1 mmol/L 3.8  Chloride 98 - 111 mmol/L 108  CO2 22 - 32 mmol/L 31  Calcium 8.9 - 10.3 mg/dL 9.4  Total Protein 6.5 - 8.1 g/dL 7.3  Total Bilirubin 0.3 - 1.2 mg/dL 0.6  Alkaline Phos 38 - 126 U/L 89  AST 15 - 41 U/L 22  ALT 0 - 44 U/L 12   CBC Latest Ref Rng & Units 03/02/2021  WBC 4.0 - 10.5 K/uL 4.7  Hemoglobin 12.0 - 15.0 g/dL 13.0  Hematocrit 36.0 - 46.0 % 40.8  Platelets 150 - 400 K/uL 180     LABORATORY DATA:  I have reviewed the data as listed Lab Results  Component Value Date   WBC 4.7 03/02/2021   HGB 13.0 03/02/2021   HCT 40.8 03/02/2021   MCV 92.5 03/02/2021   PLT 180 03/02/2021   Recent Labs    03/09/20 0030 06/16/20 1123 02/03/21 1634  NA 140 142 142  K 4.1 3.4* 3.8  CL 104 106 108  CO2 _0 GLUCOSE 87 84 138*  BUN _1 CREATININE 0.67 0.86 0.77  CALCIUM 8.8* 9.5 9.4  GFRNONAA >60 >60 >60  GFRAA >60  --   --   PROT 6.4*  --  7.3  ALBUMIN 3.0*  --  3.6  AST 15  --  22  ALT 12  --  12  ALKPHOS 77  --  89  BILITOT 0.6  --  0.6    Iron/TIBC/Ferritin/ %Sat    Component Value Date/Time   IRON 77 03/02/2021 1108   TIBC 377 03/02/2021 1108   FERRITIN 148 03/02/2021 1108   IRONPCTSAT 20 03/02/2021 1108     DG Chest 2  View  Result Date: 02/03/2021 CLINICAL DATA:  Shortness of breath EXAM: CHEST - 2 VIEW COMPARISON:  06/16/2020 FINDINGS: The heart size and mediastinal contours are within normal limits. Both lungs are clear. Surgical hardware in the cervical spine. Degenerative changes. IMPRESSION: No active cardiopulmonary disease. Electronically Signed   By: Donavan Foil M.D.   On: 02/03/2021 17:07   CT Angio Chest PE W and/or Wo Contrast  Result Date: 02/03/2021 CLINICAL DATA:  Shortness of breath recent lipoma removal EXAM: CT ANGIOGRAPHY CHEST WITH CONTRAST TECHNIQUE: Multidetector CT imaging of the chest was performed using the standard protocol during bolus  administration of intravenous contrast. Multiplanar CT image reconstructions and MIPs were obtained to evaluate the vascular anatomy. CONTRAST:  47m OMNIPAQUE IOHEXOL 350 MG/ML SOLN COMPARISON:  Chest x-ray 02/03/2021, CT chest 06/16/2020 FINDINGS: Cardiovascular: Satisfactory opacification of the pulmonary arteries to the segmental level. No evidence of pulmonary embolism. Nonaneurysmal aorta. No dissection seen. Normal cardiac size. No pericardial effusion Mediastinum/Nodes: No enlarged mediastinal, hilar, or axillary lymph nodes. Thyroid gland, trachea, and esophagus demonstrate no significant findings. Lungs/Pleura: Lungs are clear. No pleural effusion or pneumothorax. Upper Abdomen: No acute finding Musculoskeletal: No chest wall abnormality. No acute or significant osseous findings. Review of the MIP images confirms the above findings. IMPRESSION: Negative for acute pulmonary embolus or aortic dissection. Clear lung fields. Aortic Atherosclerosis (ICD10-I70.0). Electronically Signed   By: KDonavan FoilM.D.   On: 02/03/2021 19:16   CT ABDOMEN PELVIS W CONTRAST  Result Date: 02/03/2021 CLINICAL DATA:  Recent lipoma removal with wound problem EXAM: CT ABDOMEN AND PELVIS WITH CONTRAST TECHNIQUE: Multidetector CT imaging of the abdomen and pelvis was  performed using the standard protocol following bolus administration of intravenous contrast. CONTRAST:  734mOMNIPAQUE IOHEXOL 350 MG/ML SOLN COMPARISON:  CT 04/13/2020, 03/08/2020, 04/06/2018 FINDINGS: Lower chest: Lung bases demonstrate no acute consolidation or effusion. Normal cardiac size. Hepatobiliary: No focal liver abnormality is seen. Status post cholecystectomy. No biliary dilatation. Pancreas: Unremarkable. No pancreatic ductal dilatation or surrounding inflammatory changes. Spleen: Normal in size without focal abnormality. Adrenals/Urinary Tract: Right adrenal gland is normal. Stable 12 mm left adrenal nodule likely an adenoma. Kidneys show no hydronephrosis. Stable 9 mm hypodense lesion mid pole right kidney. Additional subcentimeter renal hypodensities too small to further characterize. The bladder is unremarkable Stomach/Bowel: Postsurgical changes of the stomach. No dilated small bowel. Postsurgical changes pelvic small bowel. No obstruction. No acute bowel wall thickening. Vascular/Lymphatic: Moderate to marked aortic atherosclerosis without aneurysm. No suspicious nodes. Reproductive: Uterus and bilateral adnexa are unremarkable. Other: No free air or significant pelvic effusion. Rim enhancing supraumbilical ventral fluid collection measuring 5.9 cm transverse by 11 mm AP by 8.3 cm craniocaudad, extends from sub xiphoid region to the level of the umbilicus. Generalized soft tissue stranding within the anterior abdominal fat. This is probably contiguous with infraumbilical fluid collection that communicates with the skin surface, series 2, image 59. Large rim enhancing infraumbilical fluid collection with isolated gas bubble measures 12.2 cm transverse by 4.5 cm AP by 10.1 cm craniocaudad. This larger fluid collection has a component that tracks laterally to the left, anterior to the iliac crest and into the left lateral and posterolateral abdominal wall soft tissues. Possible skin communication,  coronal series 5, image 48. Additional irregular fluid collection within the left posterior upper gluteal/low back soft tissues measuring 5.7 x 2 cm, series 2, image 55. Musculoskeletal: Posterior spinal fusion hardware L5-S1. No acute osseous abnormality. IMPRESSION: 1. Infraumbilical ventral wound that is contiguous with large subcutaneous supraumbilical and infraumbilical rim enhancing fluid collections. The infraumbilical fluid collection extends laterally to the left hip soft tissues. Moderate surrounding inflammatory changes. Findings could represent inflammatory fluid collections versus soft tissue abscess. 2. Separate irregular fluid collection within the left low back/upper gluteal posterior subcutaneous soft tissues. 3. Otherwise no CT evidence for acute intra-abdominal or pelvic abnormality. Electronically Signed   By: KiDonavan Foil.D.   On: 02/03/2021 19:37      ASSESSMENT & PLAN:  1. Iron deficiency anemia due to chronic blood loss   2. H/O gastric sleeve    #iron deficiency  anemia.  History of gastric sleeve Status post IV Venofer treatments recently.  Labs reviewed and discussed with patient.  Hemoglobin has improved to 13.  Iron panel showed  improved and stable ferritin of 148, iron saturation 20.  I will hold Venofer treatment at this point.  Follow-up in 4 months. Orders Placed This Encounter  Procedures   CBC with Differential/Platelet    Standing Status:   Future    Standing Expiration Date:   03/02/2022   Ferritin    Standing Status:   Future    Standing Expiration Date:   03/02/2022   Iron and TIBC    Standing Status:   Future    Standing Expiration Date:   03/02/2022    All questions were answered. The patient knows to call the clinic with any problems questions or concerns.       Earlie Server, MD, PhD Hematology Oncology [facility] Pager- [phone] 03/02/2021

## 2021-03-07 ENCOUNTER — Emergency Department: Payer: 59

## 2021-03-07 ENCOUNTER — Encounter: Payer: Self-pay | Admitting: *Deleted

## 2021-03-07 DIAGNOSIS — Z79899 Other long term (current) drug therapy: Secondary | ICD-10-CM | POA: Insufficient documentation

## 2021-03-07 DIAGNOSIS — E785 Hyperlipidemia, unspecified: Secondary | ICD-10-CM | POA: Diagnosis not present

## 2021-03-07 DIAGNOSIS — I509 Heart failure, unspecified: Secondary | ICD-10-CM | POA: Diagnosis not present

## 2021-03-07 DIAGNOSIS — Z794 Long term (current) use of insulin: Secondary | ICD-10-CM | POA: Diagnosis not present

## 2021-03-07 DIAGNOSIS — I2510 Atherosclerotic heart disease of native coronary artery without angina pectoris: Secondary | ICD-10-CM | POA: Diagnosis not present

## 2021-03-07 DIAGNOSIS — I110 Hypertensive heart disease with heart failure: Secondary | ICD-10-CM | POA: Diagnosis not present

## 2021-03-07 DIAGNOSIS — E1169 Type 2 diabetes mellitus with other specified complication: Secondary | ICD-10-CM | POA: Diagnosis not present

## 2021-03-07 DIAGNOSIS — R109 Unspecified abdominal pain: Secondary | ICD-10-CM | POA: Diagnosis present

## 2021-03-07 DIAGNOSIS — R1030 Lower abdominal pain, unspecified: Secondary | ICD-10-CM | POA: Diagnosis not present

## 2021-03-07 LAB — COMPREHENSIVE METABOLIC PANEL
ALT: 13 U/L (ref 0–44)
AST: 20 U/L (ref 15–41)
Albumin: 4.3 g/dL (ref 3.5–5.0)
Alkaline Phosphatase: 92 U/L (ref 38–126)
Anion gap: 11 (ref 5–15)
BUN: 15 mg/dL (ref 6–20)
CO2: 25 mmol/L (ref 22–32)
Calcium: 9.6 mg/dL (ref 8.9–10.3)
Chloride: 104 mmol/L (ref 98–111)
Creatinine, Ser: 0.88 mg/dL (ref 0.44–1.00)
GFR, Estimated: >60 mL/min (ref >60)
Glucose, Bld: 126 mg/dL — ABNORMAL HIGH (ref 70–99)
Potassium: 3.9 mmol/L (ref 3.5–5.1)
Sodium: 140 mmol/L (ref 135–145)
Total Bilirubin: 0.9 mg/dL (ref 0.3–1.2)
Total Protein: 8.4 g/dL — ABNORMAL HIGH (ref 6.5–8.1)

## 2021-03-07 LAB — CBC WITH DIFFERENTIAL/PLATELET
Abs Immature Granulocytes: 0.04 10*3/uL (ref 0.00–0.07)
Basophils Absolute: 0 10*3/uL (ref 0.0–0.1)
Basophils Relative: 0 %
Eosinophils Absolute: 0 10*3/uL (ref 0.0–0.5)
Eosinophils Relative: 0 %
HCT: 45.3 % (ref 36.0–46.0)
Hemoglobin: 14.1 g/dL (ref 12.0–15.0)
Immature Granulocytes: 1 %
Lymphocytes Relative: 17 %
Lymphs Abs: 1.2 10*3/uL (ref 0.7–4.0)
MCH: 30.3 pg (ref 26.0–34.0)
MCHC: 31.1 g/dL (ref 30.0–36.0)
MCV: 97.2 fL (ref 80.0–100.0)
Monocytes Absolute: 0.4 10*3/uL (ref 0.1–1.0)
Monocytes Relative: 6 %
Neutro Abs: 5.1 10*3/uL (ref 1.7–7.7)
Neutrophils Relative %: 76 %
Platelets: 158 10*3/uL (ref 150–400)
RBC: 4.66 MIL/uL (ref 3.87–5.11)
RDW: 14.6 % (ref 11.5–15.5)
WBC: 6.8 10*3/uL (ref 4.0–10.5)
nRBC: 0 % (ref 0.0–0.2)

## 2021-03-07 LAB — PROTIME-INR
INR: 1 (ref 0.8–1.2)
Prothrombin Time: 12.8 seconds (ref 11.4–15.2)

## 2021-03-07 LAB — LACTIC ACID, PLASMA: Lactic Acid, Venous: 1.1 mmol/L (ref 0.5–1.9)

## 2021-03-07 NOTE — ED Notes (Signed)
Lab reports new blood draw needed; triage nurse reports pt difficult stick; lab notified that they will need to come draw

## 2021-03-07 NOTE — ED Triage Notes (Signed)
Pt has redness and pain in lower abd.  Sx for 3 days.  Pt had surgery at [facility] 01/20[facility].  Pt has fever today.   Pt alert  speech clear.

## 2021-03-07 NOTE — ED Notes (Signed)
Lab tech reports blood draw successful but unable to obtain lactic; st pt refuses another attempt until roomed

## 2021-03-08 ENCOUNTER — Emergency Department: Payer: 59

## 2021-03-08 ENCOUNTER — Emergency Department
Admission: EM | Admit: 2021-03-08 | Discharge: 2021-03-08 | Disposition: A | Discharge status: Home / Self Care | Payer: 59 | Attending: Emergency Medicine | Admitting: Emergency Medicine

## 2021-03-08 DIAGNOSIS — R1030 Lower abdominal pain, unspecified: Secondary | ICD-10-CM | POA: Diagnosis not present

## 2021-03-08 DIAGNOSIS — L03311 Cellulitis of abdominal wall: Secondary | ICD-10-CM

## 2021-03-08 LAB — URINALYSIS, COMPLETE (UACMP) WITH MICROSCOPIC
Bilirubin Urine: NEGATIVE
Glucose, UA: 50 mg/dL — AB
Hgb urine dipstick: NEGATIVE
Ketones, ur: 20 mg/dL — AB
Nitrite: NEGATIVE
Protein, ur: NEGATIVE mg/dL
Specific Gravity, Urine: 1.029 (ref 1.005–1.030)
pH: 5 (ref 5.0–8.0)

## 2021-03-08 MED ORDER — IOHEXOL 350 MG/ML SOLN
80.0000 mL | Freq: Once | INTRAVENOUS | Status: AC | PRN
  Administered 2021-03-08: 80 mL via INTRAVENOUS

## 2021-03-08 MED ORDER — FLUCONAZOLE 150 MG PO TABS: ORAL_TABLET | ORAL | 0 refills | Status: DC

## 2021-03-08 MED ORDER — ONDANSETRON HCL 4 MG/2ML IJ SOLN
4.0000 mg | Freq: Once | INTRAMUSCULAR | Status: AC
  Administered 2021-03-08: 4 mg via INTRAVENOUS
  Filled 2021-03-08: qty 2

## 2021-03-08 MED ORDER — MORPHINE SULFATE (PF) 4 MG/ML IV SOLN
4.0000 mg | Freq: Once | INTRAVENOUS | Status: AC
  Administered 2021-03-08: 4 mg via INTRAVENOUS
  Filled 2021-03-08: qty 1

## 2021-03-08 MED ORDER — DIPHENHYDRAMINE HCL 25 MG PO CAPS
25.0000 mg | ORAL_CAPSULE | Freq: Once | ORAL | Status: AC
  Administered 2021-03-08: 25 mg via ORAL
  Filled 2021-03-08: qty 1

## 2021-03-08 MED ORDER — OXYCODONE-ACETAMINOPHEN 7.5-325 MG PO TABS: 1.0000 | ORAL_TABLET | ORAL | 0 refills | Status: AC | PRN

## 2021-03-08 MED ORDER — CEPHALEXIN 500 MG PO CAPS: 500.0000 mg | ORAL_CAPSULE | Freq: Four times a day (QID) | ORAL | 0 refills | Status: AC

## 2021-03-08 MED ORDER — ONDANSETRON 4 MG PO TBDP
4.0000 mg | ORAL_TABLET | Freq: Once | ORAL | Status: AC
  Administered 2021-03-08: 4 mg via ORAL
  Filled 2021-03-08: qty 1

## 2021-03-08 MED ORDER — SODIUM CHLORIDE 0.9 % IV SOLN
1.0000 g | Freq: Once | INTRAVENOUS | Status: AC
  Administered 2021-03-08: 1 g via INTRAVENOUS
  Filled 2021-03-08: qty 10

## 2021-03-08 MED ORDER — SODIUM CHLORIDE 0.9 % IV BOLUS
1000.0000 mL | Freq: Once | INTRAVENOUS | Status: AC
  Administered 2021-03-08: 1000 mL via INTRAVENOUS

## 2021-03-08 MED ORDER — OXYCODONE-ACETAMINOPHEN 5-325 MG PO TABS
1.0000 | ORAL_TABLET | Freq: Once | ORAL | Status: AC
  Administered 2021-03-08: 1 via ORAL
  Filled 2021-03-08: qty 1

## 2021-03-08 NOTE — ED Notes (Signed)
This RN went to assist pt to bedside toilet for UA and when pt was unhooked from IVF, R forearm (PIV site) was noted to be swollen and tender to touch and deemed as infiltrated, NS infusing and stopped when infiltration was noticed, MD aware, warm compress applied and IV team consult placed due to pt being a difficult stick and requests [place] IV due to multiple IV and lab sticks since arrival.

## 2021-03-08 NOTE — ED Notes (Signed)
CT made aware of IV being infiltrated and having no IV access at this time, this RN was instructed to call CT when new IV is placed.

## 2021-03-08 NOTE — ED Notes (Signed)
D/C and new RX's discussed with pt, pt verbalized understanding. NAD noted. Pt ambulatory.

## 2021-03-08 NOTE — ED Provider Notes (Signed)
[facility] Emergency Department Provider Note   ____________________________________________   Event Date/Time   First MD Initiated Contact with Patient 03/08/21 [phone]     (approximate)  I have reviewed the triage vital signs and the nursing notes.   HISTORY  Chief Complaint Abdominal Pain    HPI Marissa [name] is a 45 y.o. female who presents to the ED from home with a chief complaint of abdominal pain.  Patient has a history of panniculectomy 09/08/2020 with scar revision 01/14/2021 by Dr. Gustavus Messing, bariatric surgeon at [facility].  Her recovery has been complicated by post-operative hematoma, placed on antibiotics. She was seen at this facility 02/03/2021 for abdominal pain and fever with CT scan demonstrating inflammatory fluid collections vs soft tissue abscess. In conjunction with her bariatric surgical team, she was discharged home on Keflex.  She last saw her surgeon on 02/28/2021 who advised her to use silver nitrate cautery to close her incisional wound.  Patient noticed that since her wound closed 3 days ago she has been developing a hard knot to the affected area with surrounding warmth.  Having more pain as well as fevers.  Denies cough, chest pain, shortness of breath, nausea, vomiting.  Had diarrhea 2 days ago, now improving.     Past Medical History:  Diagnosis Date   Allergy    Anemia    Anxiety    Arthritis    Back pain, chronic    Cardiomyopathy, dilated, nonischemic (HCC)    CHF (congestive heart failure) (HCC)    Cholelithiasis    Coronary artery disease    Depression    Diabetes mellitus without complication (HCC)    fasting cbg 50-140s   Dysrhythmia    tachycardia   GERD (gastroesophageal reflux disease)    otc meds   Headache(784.0)    Hypercholesteremia    Hyperlipemia    Hypertension    IDA (iron deficiency anemia) 09/29/2019   Insulin pump in place    pt had insulin pump but it is now removed (02-18-16)   MI, old    2006    Migraines    once/month maybe - can last up to two weeks   Myocardial infarction [facility]) 2006   "due to medication"; no evidence of ischemia or infarction by nuclear stress test '11   Neck pain, chronic    Neuropathy    legs   Pneumonia 2015   [facility]   Prolonged QT interval syndrome    Renal insufficiency    Restless leg syndrome, controlled    Sepsis ([place]) 2016   secondary to surgery   Sleep apnea    does not use cpap since losing alot of weight   Vertigo    nop episodes 2020   Vision loss    due to diabetes   Wears contact lenses    Wears dentures    partial lower    Patient Active Problem List   Diagnosis Date Noted   IDA (iron deficiency anemia) 09/29/2019   Abdominal wall abscess 04/06/2018   Chronic superficial gastritis without bleeding 04/02/2018   Abscess in epidural space of lumbar spine 04/02/2018   Acute abdomen 03/16/2018   Anastomotic leak of intestine    Peritonitis ([place])    Pelvic pain 03/08/2018   Impingement syndrome of left shoulder 02/07/2018   Type 2 diabetes mellitus without complication, without long-term current use of insulin ([place]) 11/28/2017   Other fatigue 11/02/2017   Chronic tension-type headache, intractable 09/19/2017   Primary osteoarthritis  of left knee 09/10/2017   Polyarthralgia 09/10/2017   Chronic left shoulder pain 09/10/2017   Restless leg syndrome 05/23/2017   MI, old 05/23/2017   Congestive heart failure ([place]) 05/23/2017   Chronic painful diabetic neuropathy ([place]) 02/06/2017   Diabetes mellitus ([place]) 11/21/2016   Obstructive sleep apnea syndrome 11/21/2016   Dyslipidemia 11/21/2016   Paresthesia 09/06/2016   Primary insomnia 03/14/2016   Complete tear of right rotator cuff 02/01/2016   Cardiomyopathy ([place]) 04/23/2015   Long term current use of antibiotics 28/78/6767   Biliary colic 20/94/7096   Calculus of gallbladder with acute cholecystitis 04/07/2015   Bilateral occipital neuralgia 01/21/2015   Dizziness 01/04/2015    Headache 01/04/2015   DDD (degenerative disc disease), cervical 12/18/2014   DDD (degenerative disc disease), thoracic 12/18/2014   DDD (degenerative disc disease), lumbosacral 12/18/2014   Neuropathy due to secondary diabetes ([place]) 12/18/2014   Migraine 12/18/2014   Hypercholesterolemia without hypertriglyceridemia 08/28/2014   HPV test positive 07/17/2014   Class 2 obesity 04/15/2014   CAD in native artery 04/15/2014   Breathlessness on exertion 03/24/2014   Wound infection complicating hardware ([place]) 02/25/2014   Nausea alone 02/01/2014   Abdominal pain, unspecified site 02/01/2014   Severe sepsis(995.92) 01/30/2014   History of lumbar laminectomy 01/30/2014   Acute renal failure ([place]) 01/30/2014   Anemia 01/30/2014   HTN (hypertension) 01/30/2014   DM (diabetes mellitus), type 2 with renal complications ([place]) 28/36/6294   Wound infection 01/25/2014   Acute respiratory failure with hypoxia ([place]) 01/25/2014   Type 2 diabetes mellitus treated with insulin ([place]) 01/13/2014   Essential (primary) hypertension 01/13/2014   Difficulty speaking 01/13/2014   Clinical depression 01/13/2014   Insulin dependent type 2 diabetes mellitus ([place]) 01/13/2014   Back pain 11/01/2013   HNP (herniated nucleus pulposus), lumbar 10/29/2013    Past Surgical History:  Procedure Laterality Date   ACHILLES TENDON SURGERY Right 04/22/2019   Procedure: Vincente Liberty PROCEDURE WITH SUTURE ANCHOR;  Surgeon: Caroline More, DPM;  Location: [place];  Service: Podiatry;  Laterality: Right;   APPENDECTOMY N/A 03/08/2018   Procedure: APPENDECTOMY;  Surgeon: Benjaman Kindler, MD;  Location: [facility] ORS;  Service: Gynecology;  Laterality: N/A;  By Dr. Loreli Dollar SURGERY  2011   Lumbar    BOWEL RESECTION N/A 03/08/2018   Procedure: SMALL BOWEL RESECTION;  Surgeon: Benjaman Kindler, MD;  Location: [facility] OR[facility];  Service: Gynecology;  Laterality: N/A;   CARDIAC CATHETERIZATION  07/18/2004   50-60% mid LAD,  minor luminal irregularities RCA, normal LM and CX, EF 50-55% [facility])   carpel tunnel Bilateral    CERVICAL FUSION  2006   CHOLECYSTECTOMY N/A 04/08/2015   Procedure: LAPAROSCOPIC CHOLECYSTECTOMY WITH INTRAOPERATIVE CHOLANGIOGRAM;  Surgeon: Florene Glen, MD;  Location: [facility] ORS;  Service: General;  Laterality: N/A;   COLON RESECTION N/A 03/16/2018   Procedure: COLON RESECTION;  Surgeon: Florene Glen, MD;  Location: [facility] ORS;  Service: General;  Laterality: N/A;   COLONOSCOPY WITH PROPOFOL N/A 10/22/2019   Procedure: COLONOSCOPY WITH PROPOFOL;  Surgeon: Toledo, Benay Pike, MD;  Location: [facility];  Service: Gastroenterology;  Laterality: N/A;  [place] DID RAPID TEST; COPY ON CHART   ESOPHAGOGASTRODUODENOSCOPY (EGD) WITH PROPOFOL N/A 10/22/2019   Procedure: ESOPHAGOGASTRODUODENOSCOPY (EGD) WITH PROPOFOL;  Surgeon: Toledo, Benay Pike, MD;  Location: [facility];  Service: Gastroenterology;  Laterality: N/A;   HAMMER TOE SURGERY Right 04/22/2019   Procedure: HAMMER TOE CORRECTION X 4;  Surgeon: Caroline More, DPM;  Location:  [place];  Service: Podiatry;  Laterality: Right;  Diabetic - insulin and oral meds   LAPAROSCOPIC GASTRECTOMY  05/31/2017   [facility], Dr. Darnell Level   LUMBAR LAMINECTOMY/DECOMPRESSION MICRODISCECTOMY Right 05/07/2013   Procedure: Right Lumbar one-two laminectomy;  Surgeon: Elaina Hoops, MD;  Location: [place] [facility]URO ORS;  Service: Neurosurgery;  Laterality: Right;   LUMBAR LAMINECTOMY/DECOMPRESSION MICRODISCECTOMY Left 10/29/2013   Procedure: Left Lumbar five-Sacral one Laminectomy;  Surgeon: Elaina Hoops, MD;  Location: [place] [facility]UR[facility] ORS;  Service: Neurosurgery;  Laterality: Left;   LUMBAR WOUND DEBRIDEMENT N/A 01/25/2014   Procedure: LUMBAR WOUND DEBRIDEMENT;  Surgeon: Charlie Pitter, MD;  Location: [place] [facility];  Service: Neurosurgery;  Laterality: N/A;   LUMBAR WOUND DEBRIDEMENT N/A 02/25/2014   Procedure: LUMBAR WOUND DEBRIDEMENT;  Surgeon: Elaina Hoops, MD;   Location: [place] [facility]S;  Service: Neurosurgery;  Laterality: N/A;   LYSIS OF ADHESION N/A 03/08/2018   Procedure: LYSIS OF ADHESION;  Surgeon: Benjaman Kindler, MD;  Location: [facility];  Service: Gynecology;  Laterality: N/A;   SALPINGOOPHORECTOMY Bilateral    1 1997. 2nd 2001   SHOULDER ARTHROSCOPY WITH BICEPSTENOTOMY Left 03/24/2016   Procedure: shoulder arthroscopy with biceps TENOTOMY, removal loose body, limited synovectomy;  Surgeon: Leanor Kail, MD;  Location: [facility] OR[facility];  Service: Orthopedics;  Laterality: Left;   shoulder sugery     7/17 rotator cuff   SMALL BOWEL REPAIR N/A 03/08/2018   Procedure: SMALL BOWEL REPAIR;  Surgeon: Benjaman Kindler, MD;  Location: [facility];  Service: Gynecology;  Laterality: N/A;    Prior to Admission medications   Medication Sig Start Date End Date Taking? Authorizing Provider  acetaminophen (TYLENOL) 500 MG tablet Take 1,000 mg by mouth 2 (two) times daily as needed for moderate pain or headache.    [provider]  amitriptyline (ELAVIL) 50 MG tablet Take 50 mg by mouth at bedtime. Patient not taking: Reported on 12/08/2020    [provider]  atorvastatin (LIPITOR) 80 MG tablet Take 80 mg by mouth at bedtime. 08/21/19   [provider]  BIOTIN PO Take 1 tablet by mouth daily.    [provider]  cyanocobalamin (,VITAMIN B-12,) 1000 MCG/ML injection Inject 1,000 mcg into the muscle every 30 (thirty) days. 12/25/17   [provider]  ergocalciferol (VITAMIN D2) 1.25 MG (50000 UT) capsule Take 50,000 Units by mouth 2 (two) times a week.    [provider]  fluconazole (DIFLUCAN) 150 MG tablet Take 1 tablet of Diflucan and repeat 3 days later if vaginal itching persist. 02/03/21   Lannie Fields, PA-C  gabapentin (NEURONTIN) 300 MG capsule Take 600-1,200 mg by mouth See admin instructions. Take 600 mg in the morning and afternoon, then take 1200 mg at bedtime    [provider]  insulin aspart  (NOVOLOG FLEXPEN) 100 UNIT/ML FlexPen Inject 10-20 Units into the skin 3 (three) times daily with meals. Sliding scale 02/04/14   Kinnie Feil, MD  meclizine (ANTIVERT) 25 MG tablet Take 1 tablet (25 mg total) by mouth 3 (three) times daily as needed for dizziness. 12/12/17   Hedges, Dellis Filbert, PA-C  Multiple Vitamin (MULTIVITAMIN) tablet Take 1 tablet by mouth daily.    [provider]  NARCAN 4 MG/0.1ML LIQD nasal spray kit Place 1 spray into the nose once. If no response after 2 minutes repeat in opposite nostril. Patient not taking: Reported on 12/08/2020 01/15/18   [provider]  nitroGLYCERIN (NITROSTAT) 0.4 MG SL tablet Place  0.4 mg under the tongue every 5 (five) minutes as needed for chest pain.    [provider]  NON FORMULARY Floradix liquid 1 capful QD    [provider]  oxycodone (ROXICODONE) 30 MG immediate release tablet Take 15 mg by mouth every 4 (four) hours as needed for pain.    [provider]  OZEMPIC, 0.25 OR 0.5 MG/DOSE, 2 MG/1.5ML SOPN INJECT 0.375 MLS (0.5 MG TOTAL) SUBCUTANEOUSLY ONCE A WEEK FOR 30 DAYS 08/27/19   [provider]  pantoprazole (PROTONIX) 40 MG tablet TAKE 1 TABLET (40 MG TOTAL) BY MOUTH 2 (TWO) TIMES DAILY BEFORE MEALS FOR 30 DAYS Patient not taking: Reported on 12/08/2020 09/26/19   [provider]  sitaGLIPtin (JANUVIA) 100 MG tablet Take 100 mg by mouth daily. Patient not taking: Reported on 12/08/2020    [provider]  sucralfate (CARAFATE) 1 GM/10ML suspension Take by mouth. Patient not taking: Reported on 12/08/2020 09/27/19 10/27/19  [provider]  sucralfate (CARAFATE) 1 GM/10ML suspension Take by mouth. Patient not taking: No sig reported    [provider]  tiZANidine (ZANAFLEX) 4 MG tablet Limit 1 tab by mouth per day or 2-3 times per day if tolerated Patient taking differently: Take 4 mg by mouth 3 (three) times daily. 04/19/16   Mohammed Kindle, MD   traZODone (DESYREL) 50 MG tablet Take 50 mg by mouth at bedtime.  Patient not taking: Reported on 12/08/2020 11/26/17   [provider]  triamcinolone cream (KENALOG) 0.1 % Apply 1 application topically 2 (two) times daily. Avoid face, groin, underarms. 02/05/20   Ralene Bathe, MD    Allergies Other, Bactrim [sulfamethoxazole-trimethoprim], Ciprofloxacin, Vancomycin, Amoxicillin, Erythromycin, Nsaids, Azithromycin, Ceftriaxone, and Food  Family History  Problem Relation Age of Onset   Depression Mother    Drug abuse Mother    Early death Mother    Hypertension Mother    Varicose Veins Mother    CVA Mother    Diabetes Father    Early death Father    Hyperlipidemia Father    Heart Problems Father        enlarged geart   Breast cancer Maternal Grandmother        60's   Breast cancer Maternal Aunt    Lung cancer Maternal Grandfather     Social History Social History   Tobacco Use   Smoking status: Never   Smokeless tobacco: Never  Vaping Use   Vaping Use: Never used  Substance Use Topics   Alcohol use: No   Drug use: No    Review of Systems  Constitutional: Positive for fever/chills Eyes: No visual changes. ENT: No sore throat. Cardiovascular: Denies chest pain. Respiratory: Denies shortness of breath. Gastrointestinal: Positive for abdominal pain.  No nausea, no vomiting.  No diarrhea.  No constipation. Genitourinary: Negative for dysuria. Musculoskeletal: Negative for back pain. Skin: Negative for rash. Neurological: Negative for headaches, focal weakness or numbness.   ____________________________________________   PHYSICAL EXAM:  VITAL SIGNS: ED Triage Vitals [03/07/21 2046]  Enc Vitals Group     BP (!) 186/111     Pulse Rate (!) 125     Resp 20     Temp 100.2 F (37.9 C)     Temp Source Oral     SpO2 98 %     Weight      Height      Head Circumference      Peak Flow      Pain Score  7     Pain Loc      Pain Edu?      Excl. in  [place]?     Constitutional: Alert and oriented. Well appearing and in mild acute distress. Eyes: Conjunctivae are normal. PERRL. EOMI. Head: Atraumatic. Nose: No congestion/rhinnorhea. Mouth/Throat: Mucous membranes are mildly dry.   Neck: No stridor.   Cardiovascular: Normal rate, regular rhythm. Grossly normal heart sounds.  Good peripheral circulation. Respiratory: Normal respiratory effort.  No retractions. Lungs CTAB. Gastrointestinal: Soft and mildly tender to palpation midline lower abdomen without rebound or guarding.  There is a palm sized area of induration, erythema and warmth beneath the umbilicus.  Incisional wounds have closed and there is no drainage expressed. No distention. No abdominal bruits. No CVA tenderness.   Musculoskeletal: No lower extremity tenderness nor edema.  No joint effusions. Neurologic:  Normal speech and language. No gross focal neurologic deficits are appreciated. No gait instability. Skin:  Skin is warm, dry and intact. No rash noted. Psychiatric: Mood and affect are normal. Speech and behavior are normal.  ____________________________________________   LABS (all labs ordered are listed, but only abnormal results are displayed)  Labs Reviewed  COMPREHENSIVE METABOLIC PANEL - Abnormal; Notable for the following components:      Result Value   Glucose, Bld 126 (*)    Total Protein 8.4 (*)    All other components within normal limits  CULTURE, BLOOD (ROUTINE X 2)  CULTURE, BLOOD (ROUTINE X 2)  LACTIC ACID, PLASMA  CBC WITH DIFFERENTIAL/PLATELET  PROTIME-INR  URINALYSIS, COMPLETE (UACMP) WITH MICROSCOPIC   ____________________________________________  EKG  None ____________________________________________  RADIOLOGY I, Lashea Goda J, personally viewed and evaluated these images (plain radiographs) as part of my medical decision making, as well as reviewing the written report by the radiologist.  ED MD interpretation: No acute cardiopulmonary  process; CT abdomen/pelvis pending  Official radiology report(s): DG Chest 2 View  Result Date: 03/07/2021 CLINICAL DATA:  Fever EXAM: CHEST - 2 VIEW COMPARISON:  02/03/2021 FINDINGS: The heart size and mediastinal contours are within normal limits. No focal airspace consolidation, pleural effusion, or pneumothorax. The visualized skeletal structures are unremarkable. IMPRESSION: No active cardiopulmonary disease. Electronically Signed   By: Davina Poke D.O.   On: 03/07/2021 21:40    ____________________________________________   PROCEDURES  Procedure(s) performed (including Critical Care):  Procedures   ____________________________________________   INITIAL IMPRESSION / ASSESSMENT AND PLAN / ED COURSE  As part of my medical decision making, I reviewed the following data within the [facility] notes reviewed and incorporated, Labs reviewed, Old chart reviewed, Radiograph reviewed, and Notes from prior ED visits     45 year old female presenting with lower abdominal pain and fever in the setting of panniculectomy scar revision 01/14/2021. Differential diagnosis includes, but is not limited to, postoperative infection, overlying abdominal wall cellulitis, ovarian cyst, ovarian torsion, acute appendicitis, diverticulitis, urinary tract infection/pyelonephritis, endometriosis, bowel obstruction, colitis, renal colic, gastroenteritis, hernia, fibroids, endometriosis, pregnancy related pain including ectopic pregnancy, etc.   WBC unremarkable with negative lactic acid.  Blood cultures have been collected.  Given recent postoperative complications, will obtain CT abdomen/pelvis.  Administer IV morphine for pain, initiate IV fluid hydration.   Clinical Course as of 03/08/21 0657  Tue Mar 08, 2021  0617 Care transferred to Dr. Cherylann Banas at change of shift pending UA and CT scan. [JS]    Clinical Course User Index [JS] Paulette Blanch, MD      ____________________________________________   FINAL  CLINICAL IMPRESSION(S) / ED DIAGNOSES  Final diagnoses:  Lower abdominal pain     ED Discharge Orders     None        Note:  This document was prepared using Dragon voice recognition software and may include unintentional dictation errors.    Paulette Blanch, MD 03/08/21 [phone]

## 2021-03-08 NOTE — Discharge Instructions (Addendum)
Take the antibiotic as prescribed.  Follow-up with Dr. Gustavus Messing in 2 days as discussed.  Return to the ER immediately for new, worsening, or persistent severe pain, swelling, redness, reopening of the wounds, fever, weakness, vomiting, if you cannot take the antibiotic, or any other new or worsening symptoms that concern you.

## 2021-03-08 NOTE — ED Notes (Signed)
Pt requested to use restroom, pt escorted to restroom with steady gait, NAD noted at this time.

## 2021-03-08 NOTE — ED Notes (Signed)
Pt at CT

## 2021-03-08 NOTE — ED Provider Notes (Addendum)
-----------------------------------------   10:55 AM on 03/08/2021 -----------------------------------------  CT confirms cellulitis as well as redemonstrating a fluid collection consistent with an abscess, seen on prior scans and somewhat decreased in size.  I have put out a call to bariatric surgery at [facility] to determine an appropriate plan with the patient's surgery team.  UA shows questionable findings of UTI.  The patient does not have significant urinary symptoms.  ----------------------------------------- 1:05 PM on 03/08/2021 -----------------------------------------  I discussed the case with Dr. Gustavus Messing from surgery at [facility] including detailed discussion of the fluid collection and possible abscess as well as the other CT findings, as well as the patient's clinical status.  He advises that we should restart the patient on antibiotics, specifically Keflex as per her previous course of month ago, and he will evaluate her in the office in 2 days.  On reassessment, the patient appears comfortable.  Her vital signs have remained stable.  She does not have significant pain at this time.  I counseled her on the results of work-up and the plan of care.  She is stable for discharge at this time  I have ordered an IV dose of ceftriaxone in the ED.  We will then prescribe a 10-day course of Keflex.  This will also cover for possible UTI.  I instructed the patient to follow-up in 2 days as specified by her doctor.  I gave her very thorough return precautions and she expressed understanding.    Arta Silence, MD 03/08/21 1308

## 2021-03-08 NOTE — ED Notes (Signed)
[facility] Surgery for consult will call back  [phone]

## 2021-03-08 NOTE — ED Notes (Signed)
Pt is resting at this time watching TV, pt states minimal relief of pain with morphine given by night shift, RN. BP soft at this time.   Pt educated on the need for a UA, IVF infusing at this time. Pt currently drinking oral contrast. NAD noted. Warm Blanket provided. Call bell in reach.

## 2021-03-15 LAB — CULTURE, BLOOD (ROUTINE X 2)
Culture: NO GROWTH
Culture: NO GROWTH

## 2021-04-11 ENCOUNTER — Other Ambulatory Visit: Payer: 59

## 2021-04-13 ENCOUNTER — Ambulatory Visit: Payer: 59

## 2021-04-13 ENCOUNTER — Ambulatory Visit: Payer: 59 | Admitting: Oncology

## 2021-05-24 ENCOUNTER — Other Ambulatory Visit: Payer: Self-pay

## 2021-05-24 ENCOUNTER — Emergency Department: Payer: 59

## 2021-05-24 DIAGNOSIS — R059 Cough, unspecified: Secondary | ICD-10-CM | POA: Diagnosis not present

## 2021-05-24 DIAGNOSIS — I509 Heart failure, unspecified: Secondary | ICD-10-CM | POA: Diagnosis not present

## 2021-05-24 DIAGNOSIS — Z5321 Procedure and treatment not carried out due to patient leaving prior to being seen by health care provider: Secondary | ICD-10-CM | POA: Diagnosis not present

## 2021-05-24 DIAGNOSIS — R079 Chest pain, unspecified: Secondary | ICD-10-CM | POA: Diagnosis not present

## 2021-05-24 LAB — CBC
HCT: 36.7 % (ref 36.0–46.0)
Hemoglobin: 12.5 g/dL (ref 12.0–15.0)
MCH: 30.3 pg (ref 26.0–34.0)
MCHC: 34.1 g/dL (ref 30.0–36.0)
MCV: 89.1 fL (ref 80.0–100.0)
Platelets: 172 10*3/uL (ref 150–400)
RBC: 4.12 MIL/uL (ref 3.87–5.11)
RDW: 14.6 % (ref 11.5–15.5)
WBC: 4.4 10*3/uL (ref 4.0–10.5)
nRBC: 0 % (ref 0.0–0.2)

## 2021-05-24 LAB — COMPREHENSIVE METABOLIC PANEL
ALT: 17 U/L (ref 0–44)
AST: 23 U/L (ref 15–41)
Albumin: 3.8 g/dL (ref 3.5–5.0)
Alkaline Phosphatase: 82 U/L (ref 38–126)
Anion gap: 8 (ref 5–15)
BUN: 12 mg/dL (ref 6–20)
CO2: 28 mmol/L (ref 22–32)
Calcium: 8.8 mg/dL — ABNORMAL LOW (ref 8.9–10.3)
Chloride: 105 mmol/L (ref 98–111)
Creatinine, Ser: 0.75 mg/dL (ref 0.44–1.00)
GFR, Estimated: >60 mL/min (ref >60)
Glucose, Bld: 94 mg/dL (ref 70–99)
Potassium: 4 mmol/L (ref 3.5–5.1)
Sodium: 141 mmol/L (ref 135–145)
Total Bilirubin: 0.8 mg/dL (ref 0.3–1.2)
Total Protein: 6.9 g/dL (ref 6.5–8.1)

## 2021-05-24 LAB — TROPONIN I (HIGH SENSITIVITY)
Troponin I (High Sensitivity): <2 ng/L (ref <18)
Troponin I (High Sensitivity): <2 ng/L (ref <18)

## 2021-05-24 NOTE — ED Triage Notes (Addendum)
Pt in with co left sided chest pain that started today. Pt states has hx of CHF, MI in 2005 and prolonged QT interval. Pain is worse with inspiration and movement, has had a cough.

## 2021-05-25 ENCOUNTER — Emergency Department
Admission: EM | Admit: 2021-05-25 | Discharge: 2021-05-25 | Disposition: A | Discharge status: Home / Self Care | Payer: 59 | Attending: Emergency Medicine | Admitting: Emergency Medicine

## 2021-06-14 DIAGNOSIS — I639 Cerebral infarction, unspecified: Secondary | ICD-10-CM

## 2021-06-14 HISTORY — DX: Cerebral infarction, unspecified: I63.9

## 2021-07-04 ENCOUNTER — Inpatient Hospital Stay: Payer: 59 | Attending: Oncology

## 2021-07-04 ENCOUNTER — Other Ambulatory Visit: Payer: Self-pay

## 2021-07-04 DIAGNOSIS — Z886 Allergy status to analgesic agent status: Secondary | ICD-10-CM | POA: Insufficient documentation

## 2021-07-04 DIAGNOSIS — D509 Iron deficiency anemia, unspecified: Secondary | ICD-10-CM | POA: Insufficient documentation

## 2021-07-04 DIAGNOSIS — Z9049 Acquired absence of other specified parts of digestive tract: Secondary | ICD-10-CM | POA: Insufficient documentation

## 2021-07-04 DIAGNOSIS — Z8349 Family history of other endocrine, nutritional and metabolic diseases: Secondary | ICD-10-CM | POA: Diagnosis not present

## 2021-07-04 DIAGNOSIS — Z814 Family history of other substance abuse and dependence: Secondary | ICD-10-CM | POA: Insufficient documentation

## 2021-07-04 DIAGNOSIS — Z881 Allergy status to other antibiotic agents status: Secondary | ICD-10-CM | POA: Insufficient documentation

## 2021-07-04 DIAGNOSIS — Z803 Family history of malignant neoplasm of breast: Secondary | ICD-10-CM | POA: Insufficient documentation

## 2021-07-04 DIAGNOSIS — Z880 Allergy status to penicillin: Secondary | ICD-10-CM | POA: Insufficient documentation

## 2021-07-04 DIAGNOSIS — Z818 Family history of other mental and behavioral disorders: Secondary | ICD-10-CM | POA: Diagnosis not present

## 2021-07-04 DIAGNOSIS — I252 Old myocardial infarction: Secondary | ICD-10-CM | POA: Diagnosis not present

## 2021-07-04 DIAGNOSIS — Z9884 Bariatric surgery status: Secondary | ICD-10-CM | POA: Insufficient documentation

## 2021-07-04 DIAGNOSIS — Z801 Family history of malignant neoplasm of trachea, bronchus and lung: Secondary | ICD-10-CM | POA: Insufficient documentation

## 2021-07-04 DIAGNOSIS — R42 Dizziness and giddiness: Secondary | ICD-10-CM | POA: Insufficient documentation

## 2021-07-04 DIAGNOSIS — Z79899 Other long term (current) drug therapy: Secondary | ICD-10-CM | POA: Diagnosis not present

## 2021-07-04 DIAGNOSIS — Z8249 Family history of ischemic heart disease and other diseases of the circulatory system: Secondary | ICD-10-CM | POA: Diagnosis not present

## 2021-07-04 DIAGNOSIS — Z833 Family history of diabetes mellitus: Secondary | ICD-10-CM | POA: Diagnosis not present

## 2021-07-04 DIAGNOSIS — R5383 Other fatigue: Secondary | ICD-10-CM | POA: Diagnosis not present

## 2021-07-04 DIAGNOSIS — Z90721 Acquired absence of ovaries, unilateral: Secondary | ICD-10-CM | POA: Insufficient documentation

## 2021-07-04 DIAGNOSIS — D500 Iron deficiency anemia secondary to blood loss (chronic): Secondary | ICD-10-CM

## 2021-07-04 LAB — CBC WITH DIFFERENTIAL/PLATELET
Abs Immature Granulocytes: 0.01 10*3/uL (ref 0.00–0.07)
Basophils Absolute: 0 10*3/uL (ref 0.0–0.1)
Basophils Relative: 1 %
Eosinophils Absolute: 0 10*3/uL (ref 0.0–0.5)
Eosinophils Relative: 1 %
HCT: 37.4 % (ref 36.0–46.0)
Hemoglobin: 11.9 g/dL — ABNORMAL LOW (ref 12.0–15.0)
Immature Granulocytes: 0 %
Lymphocytes Relative: 37 %
Lymphs Abs: 1.6 10*3/uL (ref 0.7–4.0)
MCH: 30.1 pg (ref 26.0–34.0)
MCHC: 31.8 g/dL (ref 30.0–36.0)
MCV: 94.7 fL (ref 80.0–100.0)
Monocytes Absolute: 0.3 10*3/uL (ref 0.1–1.0)
Monocytes Relative: 7 %
Neutro Abs: 2.3 10*3/uL (ref 1.7–7.7)
Neutrophils Relative %: 54 %
Platelets: 154 10*3/uL (ref 150–400)
RBC: 3.95 MIL/uL (ref 3.87–5.11)
RDW: 14.6 % (ref 11.5–15.5)
WBC: 4.3 10*3/uL (ref 4.0–10.5)
nRBC: 0 % (ref 0.0–0.2)

## 2021-07-04 LAB — FERRITIN: Ferritin: 176 ng/mL (ref 11–307)

## 2021-07-04 LAB — IRON AND TIBC
Iron: 121 ug/dL (ref 28–170)
Saturation Ratios: 33 % — ABNORMAL HIGH (ref 10.4–31.8)
TIBC: 372 ug/dL (ref 250–450)
UIBC: 251 ug/dL

## 2021-07-05 ENCOUNTER — Inpatient Hospital Stay (HOSPITAL_BASED_OUTPATIENT_CLINIC_OR_DEPARTMENT_OTHER): Payer: 59 | Admitting: Nurse Practitioner

## 2021-07-05 ENCOUNTER — Inpatient Hospital Stay: Payer: 59 | Admitting: Oncology

## 2021-07-05 ENCOUNTER — Encounter: Payer: Self-pay | Admitting: Nurse Practitioner

## 2021-07-05 DIAGNOSIS — D509 Iron deficiency anemia, unspecified: Secondary | ICD-10-CM

## 2021-07-05 DIAGNOSIS — Z903 Acquired absence of stomach [part of]: Secondary | ICD-10-CM

## 2021-07-05 NOTE — Progress Notes (Signed)
Hematology/Oncology Follow up Note [facility] [phone] [phone]  Virtual Visit Progress Note  I connected with [name] on 07/05/21 at  1:00 PM EST by video enabled telemedicine visit and verified that I am speaking with the correct person using two identifiers.   I discussed the limitations, risks, security and privacy concerns of performing an evaluation and management service by telemedicine and the availability of in-person appointments. I also discussed with the patient that there may be a patient responsible charge related to this service. The patient expressed understanding and agreed to proceed.   Other persons participating in the visit and their role in the encounter: none   Patient's location: car; she was driving but was able to pull off the highway for visit.   Provider's location: office   Chief Complaint: Iron Deficiency Anemia  Patient Care Team: [name], MD as PCP - General (Family Medicine) Theodis Sato, Gertha Calkin, MD as Consulting Physician (Cardiology) Kary Kos, MD as Consulting Physician (Neurosurgery) Earlie Server, MD as Consulting Physician (Hematology and Oncology)  REFERRING PROVIDER: [name], MD CHIEF COMPLAINTS/REASON FOR VISIT:  Follow up of iron deficiency anemia  HISTORY OF PRESENTING ILLNESS:  Marissa [name] is a  45 y.o.  female with PMH listed below was seen in consultation at the request of [name], MD  for evaluation of iron deficiency anemia.   Reviewed patient's recent labs  09/23/2019 labs revealed anemia with hemoglobin of 9.4, MCV 78.7.  Patient was referred to [facility] emergency room on 09/24/2019 for evaluation and IV iron infusion.  Patient reports approximately 4 weeks.  Of generalized fatigue, dizziness and feeling cold. Reviewed patient's previous labs ordered by primary care physician's office, anemia is chronic onset, since at least 2014. I will have any  recent iron panel.  02/01/2024 iron saturation 10, ferritin 106. Patient received IV Feraheme and he tolerates well. 09/27/2019, patient went to [facility] ER for evaluation of epigastric pain.  She had CT abdomen pelvis done which showed no acute pathology within the abdomen or pelvis. Associated signs and symptoms: Patient reports fatigue, dizziness and feeling cold.  Denies SOB with exertion.  Denies weight loss, easy bruising, hematochezia, hemoptysis, hematuria. Context:  History of iron deficiency:  Rectal bleeding: Denies Menstrual bleeding/ Vaginal bleeding : LMP 12/21/2014, history of salpingo-oophorectomy bilateral. Hematemesis or hemoptysis : denies Blood in urine : denies  Last endoscopy: Patient had a history of laparoscopic gastric sleeve  April 2021 at [facility] due to abdominal pain, AKI. and had enteroscopy done on 12/11/2019. No specimen was collected. The area of ulceration and stenosis could not be reached with this antegrade approach. 12/15/2019 enteroscopy examined portion of the ileum was normal, diverticulosis in the left colon.  01/07/2020 he was seen by Dr.Bruce-Bariatric Surgery.  Per patient there was plan of laparoscopic adhesion lysis procedure.    INTERVAL HISTORY Marissa [name] is a 45 y.o. female with history of gastric sleeve who returns to clinic for discussion of lab results and follow-up for history of iron deficiency anemia.  Overall she feels well.  Has persistent cold intolerance.  Rates fatigue is moderate.  Dry skin.  Had normal TSH with her PCP last month.  No other specific complaints.  No chest pain or shortness of breath.  No pica.  Review of Systems  Constitutional:  Negative for appetite change, chills, fatigue and fever.  HENT:   Negative for hearing loss and voice change.   Eyes:  Negative for eye problems.  Respiratory:  Negative for chest tightness, cough and shortness of breath.   Cardiovascular:  Negative for chest pain.  Gastrointestinal:  Negative for  abdominal distention, abdominal pain and blood in stool.  Endocrine: Negative for hot flashes.  Genitourinary:  Negative for difficulty urinating and frequency.   Musculoskeletal:  Negative for arthralgias.  Skin:  Negative for itching and rash.  Neurological:  Negative for extremity weakness.  Hematological:  Negative for adenopathy.  Psychiatric/Behavioral:  Negative for confusion.    MEDICAL HISTORY:  Past Medical History:  Diagnosis Date   Allergy    Anemia    Anxiety    Arthritis    Back pain, chronic    Cardiomyopathy, dilated, nonischemic (HCC)    CHF (congestive heart failure) (HCC)    Cholelithiasis    Coronary artery disease    Depression    Diabetes mellitus without complication (HCC)    fasting cbg 50-140s   Dysrhythmia    tachycardia   GERD (gastroesophageal reflux disease)    otc meds   Headache(784.0)    Hypercholesteremia    Hyperlipemia    Hypertension    IDA (iron deficiency anemia) 09/29/2019   Insulin pump in place    pt had insulin pump but it is now removed (02-18-16)   MI, old    2006   Migraines    once/month maybe - can last up to two weeks   Myocardial infarction [facility]) 2006   "due to medication"; no evidence of ischemia or infarction by nuclear stress test '11   Neck pain, chronic    Neuropathy    legs   Pneumonia 2015   [facility]   Prolonged QT interval syndrome    Renal insufficiency    Restless leg syndrome, controlled    Sepsis ([place]) 2016   secondary to surgery   Sleep apnea    does not use cpap since losing alot of weight   Vertigo    nop episodes 2020   Vision loss    due to diabetes   Wears contact lenses    Wears dentures    partial lower    SURGICAL HISTORY: Past Surgical History:  Procedure Laterality Date   ACHILLES TENDON SURGERY Right 04/22/2019   Procedure: [facility] PROCEDURE WITH SUTURE ANCHOR;  Surgeon: Caroline More, DPM;  Location: [place];  Service: Podiatry;  Laterality: Right;   APPENDECTOMY N/A  03/08/2018   Procedure: APPENDECTOMY;  Surgeon: Benjaman Kindler, MD;  Location: [facility];  Service: Gynecology;  Laterality: N/A;  By Dr. Loreli Dollar SURGERY  2011   Lumbar    BOWEL RESECTION N/A 03/08/2018   Procedure: SMALL BOWEL RESECTION;  Surgeon: Benjaman Kindler, MD;  Location: [facility];  Service: Gynecology;  Laterality: N/A;   CARDIAC CATHETERIZATION  07/18/2004   50-60% mid LAD, minor luminal irregularities RCA, normal LM and CX, EF 50-55% [facility])   carpel tunnel Bilateral    CERVICAL FUSION  2006   CHOLECYSTECTOMY N/A 04/08/2015   Procedure: LAPAROSCOPIC CHOLECYSTECTOMY WITH INTRAOPERATIVE CHOLANGIOGRAM;  Surgeon: Florene Glen, MD;  Location: [facility];  Service: General;  Laterality: N/A;   COLON RESECTION N/A 03/16/2018   Procedure: COLON RESECTION;  Surgeon: Florene Glen, MD;  Location: [facility];  Service: General;  Laterality: N/A;   COLONOSCOPY WITH PROPOFOL N/A 10/22/2019   Procedure: COLONOSCOPY WITH PROPOFOL;  Surgeon: Toledo, Benay Pike, MD;  Location: [facility];  Service: Gastroenterology;  Laterality: N/A;  [place] DID RAPID TEST; COPY ON CHART  ESOPHAGOGASTRODUODENOSCOPY (EGD) WITH PROPOFOL N/A 10/22/2019   Procedure: ESOPHAGOGASTRODUODENOSCOPY (EGD) WITH PROPOFOL;  Surgeon: Toledo, Benay Pike, MD;  Location: [facility];  Service: Gastroenterology;  Laterality: N/A;   HAMMER TOE SURGERY Right 04/22/2019   Procedure: HAMMER TOE CORRECTION X 4;  Surgeon: Caroline More, DPM;  Location: [place];  Service: Podiatry;  Laterality: Right;  Diabetic - insulin and oral meds   LAPAROSCOPIC GASTRECTOMY  05/31/2017   [facility], Dr. Darnell Level   LUMBAR LAMINECTOMY/DECOMPRESSION MICRODISCECTOMY Right 05/07/2013   Procedure: Right Lumbar one-two laminectomy;  Surgeon: Elaina Hoops, MD;  Location: [place] [facility]UR[facility];  Service: Neurosurgery;  Laterality: Right;   LUMBAR LAMINECTOMY/DECOMPRESSION MICRODISCECTOMY Left 10/29/2013   Procedure: Left Lumbar  five-Sacral one Laminectomy;  Surgeon: Elaina Hoops, MD;  Location: [place] NEURO ORS;  Service: Neurosurgery;  Laterality: Left;   LUMBAR WOUND DEBRIDEMENT N/A 01/25/2014   Procedure: LUMBAR WOUND DEBRIDEMENT;  Surgeon: Charlie Pitter, MD;  Location: [place] NEURO ORS;  Service: Neurosurgery;  Laterality: N/A;   LUMBAR WOUND DEBRIDEMENT N/A 02/25/2014   Procedure: LUMBAR WOUND DEBRIDEMENT;  Surgeon: Elaina Hoops, MD;  Location: [place] [facility]UR[facility] ORS;  Service: Neurosurgery;  Laterality: N/A;   LYSIS OF ADHESION N/A 03/08/2018   Procedure: LYSIS OF ADHESION;  Surgeon: Benjaman Kindler, MD;  Location: [facility];  Service: Gynecology;  Laterality: N/A;   SALPINGOOPHORECTOMY Bilateral    1 1997. 2nd 2001   SHOULDER ARTHROSCOPY WITH BICEPSTENOTOMY Left 03/24/2016   Procedure: shoulder arthroscopy with biceps TENOTOMY, removal loose body, limited synovectomy;  Surgeon: Leanor Kail, MD;  Location: [facility];  Service: Orthopedics;  Laterality: Left;   shoulder sugery     7/17 rotator cuff   SMALL BOWEL REPAIR N/A 03/08/2018   Procedure: SMALL BOWEL REPAIR;  Surgeon: Benjaman Kindler, MD;  Location: [facility];  Service: Gynecology;  Laterality: N/A;    SOCIAL HISTORY: Social History   Socioeconomic History   Marital status: Married    Spouse name: Not on file   Number of children: 0   Years of education: College   Highest education level: Not on file  Occupational History   Occupation: Glass blower/designer  Tobacco Use   Smoking status: Never   Smokeless tobacco: Never  Vaping Use   Vaping Use: Never used  Substance and Sexual Activity   Alcohol use: No   Drug use: No   Sexual activity: Yes    Birth control/protection: Surgical  Other Topics Concern   Not on file  Social History Narrative   Lives at home with her fiance.   Right-handed.   Occasional caffeine use.   Social Determinants of Health   Financial Resource Strain: Not on file  Food Insecurity: Not on file  Transportation Needs: Not on file   Physical Activity: Not on file  Stress: Not on file  Social Connections: Not on file  Intimate Partner Violence: Not on file    FAMILY HISTORY: Family History  Problem Relation Age of Onset   Depression Mother    Drug abuse Mother    Early death Mother    Hypertension Mother    Varicose Veins Mother    CVA Mother    Diabetes Father    Early death Father    Hyperlipidemia Father    Heart Problems Father        enlarged geart   Breast cancer Maternal Grandmother        60's   Breast cancer Maternal Aunt    Lung cancer Maternal Grandfather  ALLERGIES:  is allergic to other, bactrim [sulfamethoxazole-trimethoprim], ciprofloxacin, vancomycin, amoxicillin, erythromycin, nsaids, azithromycin, ceftriaxone, and food.  MEDICATIONS:  Current Outpatient Medications  Medication Sig Dispense Refill   acetaminophen (TYLENOL) 500 MG tablet Take 1,000 mg by mouth 2 (two) times daily as needed for moderate pain or headache.     amitriptyline (ELAVIL) 50 MG tablet Take 50 mg by mouth at bedtime. (Patient not taking: Reported on 12/08/2020)     atorvastatin (LIPITOR) 80 MG tablet Take 80 mg by mouth at bedtime.     BIOTIN PO Take 1 tablet by mouth daily.     cyanocobalamin (,VITAMIN B-12,) 1000 MCG/ML injection Inject 1,000 mcg into the muscle every 30 (thirty) days.  3   ergocalciferol (VITAMIN D2) 1.25 MG (50000 UT) capsule Take 50,000 Units by mouth 2 (two) times a week.     fluconazole (DIFLUCAN) 150 MG tablet Take 1 tablet PO, then repeat 3 days later if continued yeast symptoms 2 tablet 0   gabapentin (NEURONTIN) 300 MG capsule Take 600-1,200 mg by mouth See admin instructions. Take 600 mg in the morning and afternoon, then take 1200 mg at bedtime     insulin aspart (NOVOLOG FLEXPEN) 100 UNIT/ML FlexPen Inject 10-20 Units into the skin 3 (three) times daily with meals. Sliding scale 15 mL 11   meclizine (ANTIVERT) 25 MG tablet Take 1 tablet (25 mg total) by mouth 3 (three) times daily  as needed for dizziness. 30 tablet 0   Multiple Vitamin (MULTIVITAMIN) tablet Take 1 tablet by mouth daily.     NARCAN 4 MG/0.1ML LIQD nasal spray kit Place 1 spray into the nose once. If no response after 2 minutes repeat in opposite nostril. (Patient not taking: Reported on 12/08/2020)  0   nitroGLYCERIN (NITROSTAT) 0.4 MG SL tablet Place 0.4 mg under the tongue every 5 (five) minutes as needed for chest pain.     NON FORMULARY Floradix liquid 1 capful QD     oxycodone (ROXICODONE) 30 MG immediate release tablet Take 15 mg by mouth every 4 (four) hours as needed for pain.     OZEMPIC, 0.25 OR 0.5 MG/DOSE, 2 MG/1.5ML SOPN INJECT 0.375 MLS (0.5 MG TOTAL) SUBCUTANEOUSLY ONCE A WEEK FOR 30 DAYS     pantoprazole (PROTONIX) 40 MG tablet TAKE 1 TABLET (40 MG TOTAL) BY MOUTH 2 (TWO) TIMES DAILY BEFORE MEALS FOR 30 DAYS (Patient not taking: Reported on 12/08/2020)     sitaGLIPtin (JANUVIA) 100 MG tablet Take 100 mg by mouth daily. (Patient not taking: Reported on 12/08/2020)     sucralfate (CARAFATE) 1 GM/10ML suspension Take by mouth. (Patient not taking: Reported on 12/08/2020)     sucralfate (CARAFATE) 1 GM/10ML suspension Take by mouth. (Patient not taking: No sig reported)     tiZANidine (ZANAFLEX) 4 MG tablet Limit 1 tab by mouth per day or 2-3 times per day if tolerated (Patient taking differently: Take 4 mg by mouth 3 (three) times daily.) 90 tablet 0   traZODone (DESYREL) 50 MG tablet Take 50 mg by mouth at bedtime.  (Patient not taking: Reported on 12/08/2020)  1   triamcinolone cream (KENALOG) 0.1 % Apply 1 application topically 2 (two) times daily. Avoid face, groin, underarms. 80 g 2   No current facility-administered medications for this visit.     PHYSICAL EXAMINATION: ECOG PERFORMANCE STATUS: 1 - Symptomatic but completely ambulatory There were no vitals filed for this visit.  There were no vitals filed for this visit.  Physical Exam Constitutional:  General: She is not in acute  distress. HENT:     Head: Normocephalic.  Pulmonary:     Effort: No respiratory distress.  Neurological:     Mental Status: She is alert and oriented to person, place, and time.  Psychiatric:        Mood and Affect: Mood normal.        Behavior: Behavior normal.    LABORATORY DATA:  I have reviewed the data as listed Lab Results  Component Value Date   WBC 4.3 07/04/2021   HGB 11.9 (L) 07/04/2021   HCT 37.4 07/04/2021   MCV 94.7 07/04/2021   PLT 154 07/04/2021   Recent Labs    02/03/21 1634 03/07/21 2213 05/24/21 2008  NA 142 140 141  K 3.8 3.9 4.0  CL 108 104 105  CO2 '31 25 28  ' GLUCOSE 138* 126* 94  BUN '13 15 12  ' CREATININE 0.77 0.88 0.75  CALCIUM 9.4 9.6 8.8*  GFRNONAA >60 >60 >60  PROT 7.3 8.4* 6.9  ALBUMIN 3.6 4.3 3.8  AST '22 20 23  ' ALT '12 13 17  ' ALKPHOS 89 92 82  BILITOT 0.6 0.9 0.8   Iron/TIBC/Ferritin/ %Sat    Component Value Date/Time   IRON 121 07/04/2021 1327   TIBC 372 07/04/2021 1327   FERRITIN 176 07/04/2021 1327   IRONPCTSAT 33 (H) 07/04/2021 1327     No results found.    ASSESSMENT & PLAN:  No diagnosis found.  Iron deficiency anemia-history of gastric sleeve.  She is previously received IV Venofer and tolerated it well.  Hemoglobin is now 11.9.  Ferritin and iron studies have normalized.  No indication for IV iron at this time.  Return to clinic:  3 months- labs only  6 months- labs, day later virtual visit with Dr Tasia Catchings   I discussed the assessment and treatment plan with the patient. The patient was provided an opportunity to ask questions and all were answered. The patient agreed with the plan and demonstrated an understanding of the instructions.   The patient was advised to call back or seek an in-person evaluation if the symptoms worsen or if the condition fails to improve as anticipated.   I spent 15 minutes face-to-face video visit time dedicated to the care of this patient on the date of this encounter to include pre-visit  review of hematology notes, face-to-face time with the patient, and post visit ordering of testing/documentation.    Beckey Rutter, DNP, AGNP-C [place] [facility] 07/05/2021

## 2021-07-09 ENCOUNTER — Other Ambulatory Visit: Payer: Self-pay

## 2021-07-09 ENCOUNTER — Emergency Department: Payer: 59

## 2021-07-09 ENCOUNTER — Emergency Department
Admission: EM | Admit: 2021-07-09 | Discharge: 2021-07-09 | Disposition: A | Discharge status: Home / Self Care | Payer: 59 | Attending: Emergency Medicine | Admitting: Emergency Medicine

## 2021-07-09 ENCOUNTER — Encounter: Payer: Self-pay | Admitting: Intensive Care

## 2021-07-09 DIAGNOSIS — Z7984 Long term (current) use of oral hypoglycemic drugs: Secondary | ICD-10-CM | POA: Insufficient documentation

## 2021-07-09 DIAGNOSIS — Z794 Long term (current) use of insulin: Secondary | ICD-10-CM | POA: Diagnosis not present

## 2021-07-09 DIAGNOSIS — G43909 Migraine, unspecified, not intractable, without status migrainosus: Secondary | ICD-10-CM | POA: Diagnosis present

## 2021-07-09 DIAGNOSIS — E1129 Type 2 diabetes mellitus with other diabetic kidney complication: Secondary | ICD-10-CM | POA: Diagnosis not present

## 2021-07-09 DIAGNOSIS — E1140 Type 2 diabetes mellitus with diabetic neuropathy, unspecified: Secondary | ICD-10-CM | POA: Diagnosis not present

## 2021-07-09 DIAGNOSIS — I2510 Atherosclerotic heart disease of native coronary artery without angina pectoris: Secondary | ICD-10-CM | POA: Diagnosis not present

## 2021-07-09 DIAGNOSIS — I509 Heart failure, unspecified: Secondary | ICD-10-CM | POA: Diagnosis not present

## 2021-07-09 DIAGNOSIS — I110 Hypertensive heart disease with heart failure: Secondary | ICD-10-CM | POA: Diagnosis not present

## 2021-07-09 DIAGNOSIS — G43809 Other migraine, not intractable, without status migrainosus: Secondary | ICD-10-CM

## 2021-07-09 DIAGNOSIS — D509 Iron deficiency anemia, unspecified: Secondary | ICD-10-CM | POA: Diagnosis not present

## 2021-07-09 LAB — CBC WITH DIFFERENTIAL/PLATELET
Abs Immature Granulocytes: 0.02 10*3/uL (ref 0.00–0.07)
Basophils Absolute: 0 10*3/uL (ref 0.0–0.1)
Basophils Relative: 1 %
Eosinophils Absolute: 0 10*3/uL (ref 0.0–0.5)
Eosinophils Relative: 1 %
HCT: 37.6 % (ref 36.0–46.0)
Hemoglobin: 12.2 g/dL (ref 12.0–15.0)
Immature Granulocytes: 1 %
Lymphocytes Relative: 24 %
Lymphs Abs: 0.9 10*3/uL (ref 0.7–4.0)
MCH: 30.3 pg (ref 26.0–34.0)
MCHC: 32.4 g/dL (ref 30.0–36.0)
MCV: 93.5 fL (ref 80.0–100.0)
Monocytes Absolute: 0.2 10*3/uL (ref 0.1–1.0)
Monocytes Relative: 7 %
Neutro Abs: 2.4 10*3/uL (ref 1.7–7.7)
Neutrophils Relative %: 66 %
Platelets: 169 10*3/uL (ref 150–400)
RBC: 4.02 MIL/uL (ref 3.87–5.11)
RDW: 14.2 % (ref 11.5–15.5)
WBC: 3.5 10*3/uL — ABNORMAL LOW (ref 4.0–10.5)
nRBC: 0 % (ref 0.0–0.2)

## 2021-07-09 LAB — BASIC METABOLIC PANEL
Anion gap: 4 — ABNORMAL LOW (ref 5–15)
BUN: 11 mg/dL (ref 6–20)
CO2: 28 mmol/L (ref 22–32)
Calcium: 9 mg/dL (ref 8.9–10.3)
Chloride: 107 mmol/L (ref 98–111)
Creatinine, Ser: 0.88 mg/dL (ref 0.44–1.00)
GFR, Estimated: >60 mL/min (ref >60)
Glucose, Bld: 102 mg/dL — ABNORMAL HIGH (ref 70–99)
Potassium: 4.6 mmol/L (ref 3.5–5.1)
Sodium: 139 mmol/L (ref 135–145)

## 2021-07-09 MED ORDER — SODIUM CHLORIDE 0.9 % IV BOLUS
1000.0000 mL | Freq: Once | INTRAVENOUS | Status: AC
  Administered 2021-07-09: 1000 mL via INTRAVENOUS

## 2021-07-09 MED ORDER — DIPHENHYDRAMINE HCL 50 MG/ML IJ SOLN
50.0000 mg | Freq: Once | INTRAMUSCULAR | Status: AC
  Administered 2021-07-09: 50 mg via INTRAVENOUS
  Filled 2021-07-09: qty 1

## 2021-07-09 MED ORDER — KETOROLAC TROMETHAMINE 30 MG/ML IJ SOLN
30.0000 mg | Freq: Once | INTRAMUSCULAR | Status: AC
  Administered 2021-07-09: 30 mg via INTRAVENOUS
  Filled 2021-07-09: qty 1

## 2021-07-09 MED ORDER — METOCLOPRAMIDE HCL 5 MG/ML IJ SOLN
10.0000 mg | Freq: Once | INTRAMUSCULAR | Status: AC
  Administered 2021-07-09: 10 mg via INTRAVENOUS
  Filled 2021-07-09: qty 2

## 2021-07-09 NOTE — ED Triage Notes (Signed)
Patient reports vision changes, described as cloudy and pain behind eyes, that started yesterday. Reports episodes last 20-25 minutes at a time. Denies head injury. HX diabetes

## 2021-07-09 NOTE — ED Provider Notes (Signed)
[facility] Emergency Department Provider Note  Time seen: 5:02 PM  I have reviewed the triage vital signs and the nursing notes.   HISTORY  Chief Complaint Visual Field Change and Eye Pain   HPI Marissa [name] is a 45 y.o. female with a past medical history of anemia, anxiety, CHF, diabetes, hypertension, hyperlipidemia, migraines, presents to the emergency department for visual changes.  According to the patient all day today she has had a migraine headache which she describes as a moderate headache behind both of her eyes along with photo and phonophobia.  Patient currently lying in a dark room with her eyes shut.  Patient denies any fever cough congestion vomiting or diarrhea.  Denies any weakness or numbness.  She has noted some changes in her vision several times today which she describes as a blurring of her peripheral vision which lasted approximately 30 seconds and then resolves.  Patient states this is not typical of her migraines.   Past Medical History:  Diagnosis Date   Allergy    Anemia    Anxiety    Arthritis    Back pain, chronic    Cardiomyopathy, dilated, nonischemic (HCC)    CHF (congestive heart failure) (HCC)    Cholelithiasis    Coronary artery disease    Depression    Diabetes mellitus without complication (HCC)    fasting cbg 50-140s   Dysrhythmia    tachycardia   GERD (gastroesophageal reflux disease)    otc meds   Headache(784.0)    Hypercholesteremia    Hyperlipemia    Hypertension    IDA (iron deficiency anemia) 09/29/2019   Insulin pump in place    pt had insulin pump but it is now removed (02-18-16)   MI, old    2006   Migraines    once/month maybe - can last up to two weeks   Myocardial infarction [facility]) 2006   "due to medication"; no evidence of ischemia or infarction by nuclear stress test '11   Neck pain, chronic    Neuropathy    legs   Pneumonia 2015   [facility]   Prolonged QT interval syndrome    Renal  insufficiency    Restless leg syndrome, controlled    Sepsis ([place]) 2016   secondary to surgery   Sleep apnea    does not use cpap since losing alot of weight   Vertigo    nop episodes 2020   Vision loss    due to diabetes   Wears contact lenses    Wears dentures    partial lower    Patient Active Problem List   Diagnosis Date Noted   IDA (iron deficiency anemia) 09/29/2019   Abdominal wall abscess 04/06/2018   Chronic superficial gastritis without bleeding 04/02/2018   Abscess in epidural space of lumbar spine 04/02/2018   Acute abdomen 03/16/2018   Anastomotic leak of intestine    Peritonitis ([place])    Pelvic pain 03/08/2018   Impingement syndrome of left shoulder 02/07/2018   Type 2 diabetes mellitus without complication, without long-term current use of insulin ([place]) 11/28/2017   Other fatigue 11/02/2017   Chronic tension-type headache, intractable 09/19/2017   Primary osteoarthritis of left knee 09/10/2017   Polyarthralgia 09/10/2017   Chronic left shoulder pain 09/10/2017   Restless leg syndrome 05/23/2017   MI, old 05/23/2017   Congestive heart failure ([place]) 05/23/2017   Chronic painful diabetic neuropathy ([place]) 02/06/2017   Diabetes mellitus ([place]) 11/21/2016   Obstructive  sleep apnea syndrome 11/21/2016   Dyslipidemia 11/21/2016   Paresthesia 09/06/2016   Primary insomnia 03/14/2016   Complete tear of right rotator cuff 02/01/2016   Cardiomyopathy ([place]) 04/23/2015   Long term current use of antibiotics 35/57/3220   Biliary colic 25/42/7062   Calculus of gallbladder with acute cholecystitis 04/07/2015   Bilateral occipital neuralgia 01/21/2015   Dizziness 01/04/2015   Headache 01/04/2015   DDD (degenerative disc disease), cervical 12/18/2014   DDD (degenerative disc disease), thoracic 12/18/2014   DDD (degenerative disc disease), lumbosacral 12/18/2014   Neuropathy due to secondary diabetes ([place]) 12/18/2014   Migraine 12/18/2014   Hypercholesterolemia  without hypertriglyceridemia 08/28/2014   HPV test positive 07/17/2014   Class 2 obesity 04/15/2014   CAD in native artery 04/15/2014   Breathlessness on exertion 03/24/2014   Wound infection complicating hardware ([place]) 02/25/2014   Nausea alone 02/01/2014   Abdominal pain, unspecified site 02/01/2014   Severe sepsis(995.92) 01/30/2014   History of lumbar laminectomy 01/30/2014   Acute renal failure ([place]) 01/30/2014   Anemia 01/30/2014   HTN (hypertension) 01/30/2014   DM (diabetes mellitus), type 2 with renal complications ([place]) 37/62/8315   Wound infection 01/25/2014   Acute respiratory failure with hypoxia ([place]) 01/25/2014   Type 2 diabetes mellitus treated with insulin ([place]) 01/13/2014   Essential (primary) hypertension 01/13/2014   Difficulty speaking 01/13/2014   Clinical depression 01/13/2014   Insulin dependent type 2 diabetes mellitus ([place]) 01/13/2014   Back pain 11/01/2013   HNP (herniated nucleus pulposus), lumbar 10/29/2013    Past Surgical History:  Procedure Laterality Date   ACHILLES TENDON SURGERY Right 04/22/2019   Procedure: Vincente Liberty PROCEDURE WITH SUTURE ANCHOR;  Surgeon: Caroline More, DPM;  Location: [place];  Service: Podiatry;  Laterality: Right;   APPENDECTOMY N/A 03/08/2018   Procedure: APPENDECTOMY;  Surgeon: Benjaman Kindler, MD;  Location: [facility] ORS;  Service: Gynecology;  Laterality: N/A;  By Dr. Loreli Dollar SURGERY  2011   Lumbar    BOWEL RESECTION N/A 03/08/2018   Procedure: SMALL BOWEL RESECTION;  Surgeon: Benjaman Kindler, MD;  Location: [facility] ORS;  Service: Gynecology;  Laterality: N/A;   CARDIAC CATHETERIZATION  07/18/2004   50-60% mid LAD, minor luminal irregularities RCA, normal LM and CX, EF 50-55% [facility])   carpel tunnel Bilateral    CERVICAL FUSION  2006   CHOLECYSTECTOMY N/A 04/08/2015   Procedure: LAPAROSCOPIC CHOLECYSTECTOMY WITH INTRAOPERATIVE CHOLANGIOGRAM;  Surgeon: Florene Glen, MD;  Location: [facility]  ORS;  Service: General;  Laterality: N/A;   COLON RESECTION N/A 03/16/2018   Procedure: COLON RESECTION;  Surgeon: Florene Glen, MD;  Location: [facility] ORS;  Service: General;  Laterality: N/A;   COLONOSCOPY WITH PROPOFOL N/A 10/22/2019   Procedure: COLONOSCOPY WITH PROPOFOL;  Surgeon: Toledo, Benay Pike, MD;  Location: [facility] ENDOSCOPY;  Service: Gastroenterology;  Laterality: N/A;  [place] DID RAPID TEST; COPY ON CHART   ESOPHAGOGASTRODUODENOSCOPY (EGD) WITH PROPOFOL N/A 10/22/2019   Procedure: ESOPHAGOGASTRODUODENOSCOPY (EGD) WITH PROPOFOL;  Surgeon: Toledo, Benay Pike, MD;  Location: [facility] ENDOSCOPY;  Service: Gastroenterology;  Laterality: N/A;   HAMMER TOE SURGERY Right 04/22/2019   Procedure: HAMMER TOE CORRECTION X 4;  Surgeon: Caroline More, DPM;  Location: [place];  Service: Podiatry;  Laterality: Right;  Diabetic - insulin and oral meds   LAPAROSCOPIC GASTRECTOMY  05/31/2017   [facility], Dr. Darnell Level   LUMBAR LAMINECTOMY/DECOMPRESSION MICRODISCECTOMY Right 05/07/2013   Procedure: Right Lumbar one-two laminectomy;  Surgeon: Elaina Hoops, MD;  Location:  [place] [facility];  Service: Neurosurgery;  Laterality: Right;   LUMBAR LAMINECTOMY/DECOMPRESSION MICRODISCECTOMY Left 10/29/2013   Procedure: Left Lumbar five-Sacral one Laminectomy;  Surgeon: Elaina Hoops, MD;  Location: [place] [facility]UR[facility] ORS;  Service: Neurosurgery;  Laterality: Left;   LUMBAR WOUND DEBRIDEMENT N/A 01/25/2014   Procedure: LUMBAR WOUND DEBRIDEMENT;  Surgeon: Charlie Pitter, MD;  Location: [place] [facility]URO ORS;  Service: Neurosurgery;  Laterality: N/A;   LUMBAR WOUND DEBRIDEMENT N/A 02/25/2014   Procedure: LUMBAR WOUND DEBRIDEMENT;  Surgeon: Elaina Hoops, MD;  Location: [place] NEURO ORS;  Service: Neurosurgery;  Laterality: N/A;   LYSIS OF ADHESION N/A 03/08/2018   Procedure: LYSIS OF ADHESION;  Surgeon: Benjaman Kindler, MD;  Location: [facility] ORS;  Service: Gynecology;  Laterality: N/A;   SALPINGOOPHORECTOMY Bilateral    1 1997. 2nd 2001   SHOULDER ARTHROSCOPY  WITH BICEPSTENOTOMY Left 03/24/2016   Procedure: shoulder arthroscopy with biceps TENOTOMY, removal loose body, limited synovectomy;  Surgeon: Leanor Kail, MD;  Location: [facility] ORS;  Service: Orthopedics;  Laterality: Left;   shoulder sugery     7/17 rotator cuff   SMALL BOWEL REPAIR N/A 03/08/2018   Procedure: SMALL BOWEL REPAIR;  Surgeon: Benjaman Kindler, MD;  Location: [facility] ORS;  Service: Gynecology;  Laterality: N/A;    Prior to Admission medications   Medication Sig Start Date End Date Taking? Authorizing Provider  acetaminophen (TYLENOL) 500 MG tablet Take 1,000 mg by mouth 2 (two) times daily as needed for moderate pain or headache.    [provider]  amitriptyline (ELAVIL) 50 MG tablet Take 50 mg by mouth at bedtime. Patient not taking: Reported on 12/08/2020    [provider]  atorvastatin (LIPITOR) 80 MG tablet Take 80 mg by mouth at bedtime. 08/21/19   [provider]  BIOTIN PO Take 1 tablet by mouth daily.    [provider]  cyanocobalamin (,VITAMIN B-12,) 1000 MCG/ML injection Inject 1,000 mcg into the muscle every 30 (thirty) days. 12/25/17   [provider]  ergocalciferol (VITAMIN D2) 1.25 MG (50000 UT) capsule Take 50,000 Units by mouth 2 (two) times a week.    [provider]  fluconazole (DIFLUCAN) 150 MG tablet Take 1 tablet PO, then repeat 3 days later if continued yeast symptoms 03/08/21   Arta Silence, MD  gabapentin (NEURONTIN) 300 MG capsule Take 600-1,200 mg by mouth See admin instructions. Take 600 mg in the morning and afternoon, then take 1200 mg at bedtime    [provider]  insulin aspart (NOVOLOG FLEXPEN) 100 UNIT/ML FlexPen Inject 10-20 Units into the skin 3 (three) times daily with meals. Sliding scale 02/04/14   Kinnie Feil, MD  meclizine (ANTIVERT) 25 MG tablet Take 1 tablet (25 mg total) by mouth 3 (three) times daily as needed for dizziness. 12/12/17   Hedges, Dellis Filbert, PA-C  Multiple  Vitamin (MULTIVITAMIN) tablet Take 1 tablet by mouth daily.    [provider]  NARCAN 4 MG/0.1ML LIQD nasal spray kit Place 1 spray into the nose once. If no response after 2 minutes repeat in opposite nostril. Patient not taking: Reported on 12/08/2020 01/15/18   [provider]  nitroGLYCERIN (NITROSTAT) 0.4 MG SL tablet Place 0.4 mg under the tongue every 5 (five) minutes as needed for chest pain.    [provider]  NON FORMULARY Floradix liquid 1 capful QD    [provider]  oxycodone (ROXICODONE) 30 MG immediate release tablet Take 15 mg by mouth every 4 (four) hours as  needed for pain.    [provider]  OZEMPIC, 0.25 OR 0.5 MG/DOSE, 2 MG/1.5ML SOPN INJECT 0.375 MLS (0.5 MG TOTAL) SUBCUTANEOUSLY ONCE A WEEK FOR 30 DAYS 08/27/19   [provider]  pantoprazole (PROTONIX) 40 MG tablet TAKE 1 TABLET (40 MG TOTAL) BY MOUTH 2 (TWO) TIMES DAILY BEFORE MEALS FOR 30 DAYS Patient not taking: Reported on 12/08/2020 09/26/19   [provider]  sitaGLIPtin (JANUVIA) 100 MG tablet Take 100 mg by mouth daily. Patient not taking: Reported on 12/08/2020    [provider]  sucralfate (CARAFATE) 1 GM/10ML suspension Take by mouth. Patient not taking: Reported on 12/08/2020 09/27/19 10/27/19  [provider]  sucralfate (CARAFATE) 1 GM/10ML suspension Take by mouth. Patient not taking: No sig reported    [provider]  tiZANidine (ZANAFLEX) 4 MG tablet Limit 1 tab by mouth per day or 2-3 times per day if tolerated Patient taking differently: Take 4 mg by mouth 3 (three) times daily. 04/19/16   Mohammed Kindle, MD  traZODone (DESYREL) 50 MG tablet Take 50 mg by mouth at bedtime.  Patient not taking: Reported on 12/08/2020 11/26/17   [provider]  triamcinolone cream (KENALOG) 0.1 % Apply 1 application topically 2 (two) times daily. Avoid face, groin, underarms. 02/05/20   Ralene Bathe, MD    Allergies   Allergen Reactions   Other Hives, Rash and Other (See Comments)    Greek yogurt only, gastric by pass  [place] yogurt only   Bactrim [Sulfamethoxazole-Trimethoprim] Nausea And Vomiting   Ciprofloxacin Swelling   Vancomycin Nausea And Vomiting   Amoxicillin Nausea And Vomiting    Patient tolerated Zosyn during the whole admission  Has patient had a PCN reaction causing immediate rash, facial/tongue/throat swelling, SOB or lightheadedness with hypotension: No Has patient had a PCN reaction causing severe rash involving mucus membranes or skin necrosis: No Has patient had a PCN reaction that required hospitalization: No Has patient had a PCN reaction occurring within the last 10 years: Yes If all of the above answers are "NO", then may proceed with Cephalosporin use.    Erythromycin Itching   Nsaids     Avoids due to gastric bypass   Azithromycin Itching and Rash   Ceftriaxone Nausea And Vomiting   Food Itching and Rash    "[place] yogurt"    Family History  Problem Relation Age of Onset   Depression Mother    Drug abuse Mother    Early death Mother    Hypertension Mother    Varicose Veins Mother    CVA Mother    Diabetes Father    Early death Father    Hyperlipidemia Father    Heart Problems Father        enlarged geart   Breast cancer Maternal Grandmother        60's   Breast cancer Maternal Aunt    Lung cancer Maternal Grandfather     Social History Social History   Tobacco Use   Smoking status: Never   Smokeless tobacco: Never  Vaping Use   Vaping Use: Never used  Substance Use Topics   Alcohol use: No   Drug use: No    Review of Systems Constitutional: Negative for fever. Cardiovascular: Negative for chest pain. Respiratory: Negative for shortness of breath. Gastrointestinal: Negative for abdominal pain, vomiting  Musculoskeletal: Negative for musculoskeletal complaints Neurological: Moderate headache with photo and phonophobia.  No weakness or numbness  confusion or slurred speech. All other ROS  negative  ____________________________________________   PHYSICAL EXAM:  VITAL SIGNS: ED Triage Vitals  Enc Vitals Group     BP 07/09/21 1341 124/89     Pulse Rate 07/09/21 1341 88     Resp 07/09/21 1341 14     Temp 07/09/21 1341 98.4 F (36.9 C)     Temp Source 07/09/21 1341 Oral     SpO2 07/09/21 1341 100 %     Weight 07/09/21 1338 140 lb (63.5 kg)     Height 07/09/21 1338 '5\' 5"'  (1.651 m)     Head Circumference --      Peak Flow --      Pain Score 07/09/21 1338 5     Pain Loc --      Pain Edu? --      Excl. in [place]? --     Constitutional: Alert and oriented. Well appearing and in no distress. Eyes: 2 to 3 mm bilaterally, reactive, positive for photophobia ENT      Head: Normocephalic and atraumatic.      Mouth/Throat: Mucous membranes are moist. Cardiovascular: Normal rate, regular rhythm Respiratory: Normal respiratory effort without tachypnea nor retractions. Breath sounds are clear  Gastrointestinal: Soft and nontender. No distention Musculoskeletal: Nontender with normal range of motion in all extremities.  Neurologic:  Normal speech and language. No gross focal neurologic deficits.  5/5 motor in all extremities. Skin:  Skin is warm, dry and intact.  Psychiatric: Mood and affect are normal.  ____________________________________________     RADIOLOGY  CT head negative  ____________________________________________   INITIAL IMPRESSION / ASSESSMENT AND PLAN / ED COURSE  Pertinent labs & imaging results that were available during my care of the patient were reviewed by me and considered in my medical decision making (see chart for details).   Patient presents emergency department for headache with pain behind her eyes and occasional blurred vision.  Patient states her headache feels like a migraine headache.  Has a history of migraines for which she is prescribed medications by her doctor, she states they have not been  working very well today.  Has also used Excedrin Migraine without relief today.  Patient's description of the headache seems consistent with migraine headache or possible ocular migraine.  No concerning findings on physical exam normal neurological exam.  Reassuring vital signs, reassuring lab work.  However given the patient's change from her typical migraine we will obtain CT imaging of the head as a precaution.  We will treat with Toradol Reglan Benadryl IV fluids and reassess.  Patient was a very difficult IV stick.  I was able to place an ultrasound-guided IV.  Patient received migraine medications around 7:30 PM.  At 9 PM patient left AMA, the nurse remove the IV prior to her leaving but the patient refused to wait until I could speak to her further.  KEELIN NEVILLE was evaluated in Emergency Department on 07/09/2021 for the symptoms described in the history of present illness. She was evaluated in the context of the global COVID-19 pandemic, which necessitated consideration that the patient might be at risk for infection with the SARS-CoV-2 virus that causes COVID-19. Institutional protocols and algorithms that pertain to the evaluation of patients at risk for COVID-19 are in a state of rapid change based on information released by regulatory bodies including the CDC and federal and state organizations. These policies and algorithms were followed during the patient's care in the ED.  ____________________________________________   FINAL CLINICAL IMPRESSION(S) / ED DIAGNOSES  Migraine headache   Harvest Dark, MD 07/09/21 2325

## 2021-07-09 NOTE — ED Notes (Signed)
PT endorsing headache and nausea. MD aware. IV team unable to place in patient. MD Malinda aware. Will attempt line at their earliest convenience

## 2021-07-09 NOTE — ED Provider Notes (Addendum)
Emergency Medicine Provider Triage Evaluation Note  Marissa [name], a 45 y.o. female  was evaluated in triage.  Pt complains of intermittent blurry vision that lasts about 20 to 25 minutes at a time.  She did reports cloudy vision but denies any field cuts or vision loss.  She also reports some pain behind the eyes bilaterally.  She noted onset of symptoms yesterday.  Symptoms are aggravated by change of positions.  Patient gives a history of diabetes, hypertension, and migraines.  Review of Systems  Positive: Intermittent blurry vision Negative: NV, syncope, head injury  Physical Exam  Ht 5\' 5"  (1.651 m)   Wt 63.5 kg   LMP 12/21/2014 (Approximate)   BMI 23.30 kg/m  Gen:   Awake, no distress  NAD Resp:  Normal effort CTA MSK:   Moves extremities without difficulty  Other:  EYES: color contacts in place, [place] Making  Medically screening exam initiated at 1:39 PM.  Appropriate orders placed.  [name] was informed that the remainder of the evaluation will be completed by another provider, this initial triage assessment does not replace that evaluation, and the importance of remaining in the ED until their evaluation is complete.  Patient with history of diabetes, migraines, and hypertension, presents to the ED with complaints of intermittent blurry vision bilaterally as well as some mild eye pain.   Melvenia Needles, PA-C 07/09/21 1341    [name], Dannielle Karvonen, PA-C 07/09/21 1345    [name], MD 07/09/21 [phone]

## 2021-07-09 NOTE — ED Notes (Signed)
PT asked this RN to remove IV. NT Ariel removed IV. Pt left AMA off unit without signing any paperwork. Pt refusing to stay to see MD.

## 2021-09-03 ENCOUNTER — Emergency Department: Payer: 59

## 2021-09-03 ENCOUNTER — Other Ambulatory Visit: Payer: Self-pay

## 2021-09-03 ENCOUNTER — Emergency Department
Admission: EM | Admit: 2021-09-03 | Discharge: 2021-09-03 | Disposition: A | Discharge status: Home / Self Care | Payer: 59 | Attending: Emergency Medicine | Admitting: Emergency Medicine

## 2021-09-03 ENCOUNTER — Encounter: Payer: Self-pay | Admitting: Emergency Medicine

## 2021-09-03 DIAGNOSIS — R1310 Dysphagia, unspecified: Secondary | ICD-10-CM | POA: Insufficient documentation

## 2021-09-03 DIAGNOSIS — I110 Hypertensive heart disease with heart failure: Secondary | ICD-10-CM | POA: Insufficient documentation

## 2021-09-03 DIAGNOSIS — U071 COVID-19: Secondary | ICD-10-CM | POA: Insufficient documentation

## 2021-09-03 DIAGNOSIS — E119 Type 2 diabetes mellitus without complications: Secondary | ICD-10-CM | POA: Insufficient documentation

## 2021-09-03 DIAGNOSIS — I509 Heart failure, unspecified: Secondary | ICD-10-CM | POA: Insufficient documentation

## 2021-09-03 DIAGNOSIS — R509 Fever, unspecified: Secondary | ICD-10-CM | POA: Diagnosis present

## 2021-09-03 DIAGNOSIS — R42 Dizziness and giddiness: Secondary | ICD-10-CM | POA: Insufficient documentation

## 2021-09-03 LAB — BASIC METABOLIC PANEL
Anion gap: 9 (ref 5–15)
BUN: 11 mg/dL (ref 6–20)
CO2: 28 mmol/L (ref 22–32)
Calcium: 9.1 mg/dL (ref 8.9–10.3)
Chloride: 102 mmol/L (ref 98–111)
Creatinine, Ser: 1.04 mg/dL — ABNORMAL HIGH (ref 0.44–1.00)
GFR, Estimated: >60 mL/min (ref >60)
Glucose, Bld: 131 mg/dL — ABNORMAL HIGH (ref 70–99)
Potassium: 4.2 mmol/L (ref 3.5–5.1)
Sodium: 139 mmol/L (ref 135–145)

## 2021-09-03 LAB — CBC
HCT: 40.3 % (ref 36.0–46.0)
Hemoglobin: 12.8 g/dL (ref 12.0–15.0)
MCH: 29.6 pg (ref 26.0–34.0)
MCHC: 31.8 g/dL (ref 30.0–36.0)
MCV: 93.1 fL (ref 80.0–100.0)
Platelets: 148 10*3/uL — ABNORMAL LOW (ref 150–400)
RBC: 4.33 MIL/uL (ref 3.87–5.11)
RDW: 12.9 % (ref 11.5–15.5)
WBC: 4.3 10*3/uL (ref 4.0–10.5)
nRBC: 0 % (ref 0.0–0.2)

## 2021-09-03 LAB — RESP PANEL BY RT-PCR (FLU A&B, COVID) ARPGX2
Influenza A by PCR: NEGATIVE
Influenza B by PCR: NEGATIVE
SARS Coronavirus 2 by RT PCR: POSITIVE — AB

## 2021-09-03 MED ORDER — ALBUTEROL SULFATE HFA 108 (90 BASE) MCG/ACT IN AERS: 2.0000 | INHALATION_SPRAY | Freq: Four times a day (QID) | RESPIRATORY_TRACT | 0 refills | Status: DC | PRN

## 2021-09-03 MED ORDER — ACETAMINOPHEN 500 MG PO TABS
1000.0000 mg | ORAL_TABLET | Freq: Once | ORAL | Status: AC
  Administered 2021-09-03: 1000 mg via ORAL
  Filled 2021-09-03: qty 2

## 2021-09-03 MED ORDER — NIRMATRELVIR/RITONAVIR (PAXLOVID)TABLET: 3.0000 | ORAL_TABLET | Freq: Two times a day (BID) | ORAL | 0 refills | Status: AC

## 2021-09-03 MED ORDER — PROMETHAZINE-DM 6.25-15 MG/5ML PO SYRP: 5.0000 mL | ORAL_SOLUTION | Freq: Four times a day (QID) | ORAL | 0 refills | Status: DC | PRN

## 2021-09-03 NOTE — ED Triage Notes (Signed)
Pt via POV from home. Pt c/o SOB, fever, generalized body aches, and headache for the past 2 days. States her temp was 102.8 this AM but didn't take anything for it. Pt denies hx of asthma, CHF, or COPD. Pt is A&OX4 and NAD.

## 2021-09-03 NOTE — ED Provider Triage Note (Signed)
Emergency Medicine Provider Triage Evaluation Note  Marissa [name] , a 46 y.o. female  was evaluated in triage.  Pt complains of "feeling like crap.".  Review of Systems  Positive: Fever, cough Negative: syncope  Physical Exam  BP 117/76 (BP Location: Left Arm)    Pulse (!) 121    Temp (!) 103 F (39.4 C) (Oral)    Resp 20    Ht 5' 4.5" (1.638 m)    Wt 68.5 kg    LMP 12/21/2014 (Approximate)    SpO2 99%    BMI 25.52 kg/m  Gen:   Awake, no distress ./ Appears uncomfortable  Resp:  Normal effort  MSK:   Moves extremities without difficulty  Other:    Medical Decision Making  Medically screening exam initiated at 9:29 AM.  Appropriate orders placed.  [name] was informed that the remainder of the evaluation will be completed by another provider, this initial triage assessment does not replace that evaluation, and the importance of remaining in the ED until their evaluation is complete.     Vladimir Crofts, MD 09/03/21 0930

## 2021-09-03 NOTE — ED Provider Notes (Signed)
[facility] Emergency Department Provider Note ____________________________________________  Time seen: 1315  I have reviewed the triage vital signs and the nursing notes.  HISTORY  Chief Complaint  Shortness of Breath and Fever   HPI Marissa [name] is a 46 y.o. female presents to the ER today with complaint of 3-day history of headache, sore throat, fever, chills or body aches.  She reports this started 3 days ago.  The headache is located all over.  She describes the pain as throbbing.  She reports some associated lightheadedness but denies vision changes.  She is having some difficulty swallowing.  She has had a fever up to 102.8 at home.  She denies runny nose, nasal congestion, ear pain, shortness of breath, chest pain, nausea, vomiting or diarrhea.  She has not taken any medications OTC for this.  She does have a history of CHF, DM2, HLD status post MI.  Past Medical History:  Diagnosis Date   Allergy    Anemia    Anxiety    Arthritis    Back pain, chronic    Cardiomyopathy, dilated, nonischemic (HCC)    CHF (congestive heart failure) (HCC)    Cholelithiasis    Coronary artery disease    Depression    Diabetes mellitus without complication (HCC)    fasting cbg 50-140s   Dysrhythmia    tachycardia   GERD (gastroesophageal reflux disease)    otc meds   Headache(784.0)    Hypercholesteremia    Hyperlipemia    Hypertension    IDA (iron deficiency anemia) 09/29/2019   Insulin pump in place    pt had insulin pump but it is now removed (02-18-16)   MI, old    2006   Migraines    once/month maybe - can last up to two weeks   Myocardial infarction [facility]) 2006   "due to medication"; no evidence of ischemia or infarction by nuclear stress test '11   Neck pain, chronic    Neuropathy    legs   Pneumonia 2015   [facility]   Prolonged QT interval syndrome    Renal insufficiency    Restless leg syndrome, controlled    Sepsis ([place]) 2016   secondary to  surgery   Sleep apnea    does not use cpap since losing alot of weight   Vertigo    nop episodes 2020   Vision loss    due to diabetes   Wears contact lenses    Wears dentures    partial lower    Patient Active Problem List   Diagnosis Date Noted   IDA (iron deficiency anemia) 09/29/2019   Abdominal wall abscess 04/06/2018   Chronic superficial gastritis without bleeding 04/02/2018   Abscess in epidural space of lumbar spine 04/02/2018   Acute abdomen 03/16/2018   Anastomotic leak of intestine    Peritonitis ([place])    Pelvic pain 03/08/2018   Impingement syndrome of left shoulder 02/07/2018   Type 2 diabetes mellitus without complication, without long-term current use of insulin ([place]) 11/28/2017   Other fatigue 11/02/2017   Chronic tension-type headache, intractable 09/19/2017   Primary osteoarthritis of left knee 09/10/2017   Polyarthralgia 09/10/2017   Chronic left shoulder pain 09/10/2017   Restless leg syndrome 05/23/2017   MI, old 05/23/2017   Congestive heart failure ([place]) 05/23/2017   Chronic painful diabetic neuropathy ([place]) 02/06/2017   Diabetes mellitus ([place]) 11/21/2016   Obstructive sleep apnea syndrome 11/21/2016   Dyslipidemia 11/21/2016   Paresthesia 09/06/2016  Primary insomnia 03/14/2016   Complete tear of right rotator cuff 02/01/2016   Cardiomyopathy ([place]) 04/23/2015   Long term current use of antibiotics 51/88/4166   Biliary colic 02/11/1600   Calculus of gallbladder with acute cholecystitis 04/07/2015   Bilateral occipital neuralgia 01/21/2015   Dizziness 01/04/2015   Headache 01/04/2015   DDD (degenerative disc disease), cervical 12/18/2014   DDD (degenerative disc disease), thoracic 12/18/2014   DDD (degenerative disc disease), lumbosacral 12/18/2014   Neuropathy due to secondary diabetes ([place]) 12/18/2014   Migraine 12/18/2014   Hypercholesterolemia without hypertriglyceridemia 08/28/2014   HPV test positive 07/17/2014   Class 2 obesity  04/15/2014   CAD in native artery 04/15/2014   Breathlessness on exertion 03/24/2014   Wound infection complicating hardware ([place]) 02/25/2014   Nausea alone 02/01/2014   Abdominal pain, unspecified site 02/01/2014   Severe sepsis(995.92) 01/30/2014   History of lumbar laminectomy 01/30/2014   Acute renal failure ([place]) 01/30/2014   Anemia 01/30/2014   HTN (hypertension) 01/30/2014   DM (diabetes mellitus), type 2 with renal complications ([place]) 09/32/3557   Wound infection 01/25/2014   Acute respiratory failure with hypoxia ([place]) 01/25/2014   Type 2 diabetes mellitus treated with insulin ([place]) 01/13/2014   Essential (primary) hypertension 01/13/2014   Difficulty speaking 01/13/2014   Clinical depression 01/13/2014   Insulin dependent type 2 diabetes mellitus ([place]) 01/13/2014   Back pain 11/01/2013   HNP (herniated nucleus pulposus), lumbar 10/29/2013    Past Surgical History:  Procedure Laterality Date   ACHILLES TENDON SURGERY Right 04/22/2019   Procedure: Vincente Liberty PROCEDURE WITH SUTURE ANCHOR;  Surgeon: Caroline More, DPM;  Location: [place];  Service: Podiatry;  Laterality: Right;   APPENDECTOMY N/A 03/08/2018   Procedure: APPENDECTOMY;  Surgeon: Benjaman Kindler, MD;  Location: [facility] ORS;  Service: Gynecology;  Laterality: N/A;  By Dr. Loreli Dollar SURGERY  2011   Lumbar    BOWEL RESECTION N/A 03/08/2018   Procedure: SMALL BOWEL RESECTION;  Surgeon: Benjaman Kindler, MD;  Location: [facility] ORS;  Service: Gynecology;  Laterality: N/A;   CARDIAC CATHETERIZATION  07/18/2004   50-60% mid LAD, minor luminal irregularities RCA, normal LM and CX, EF 50-55% [facility])   carpel tunnel Bilateral    CERVICAL FUSION  2006   CHOLECYSTECTOMY N/A 04/08/2015   Procedure: LAPAROSCOPIC CHOLECYSTECTOMY WITH INTRAOPERATIVE CHOLANGIOGRAM;  Surgeon: Florene Glen, MD;  Location: [facility] ORS;  Service: General;  Laterality: N/A;   COLON RESECTION N/A 03/16/2018   Procedure:  COLON RESECTION;  Surgeon: Florene Glen, MD;  Location: [facility] ORS;  Service: General;  Laterality: N/A;   COLONOSCOPY WITH PROPOFOL N/A 10/22/2019   Procedure: COLONOSCOPY WITH PROPOFOL;  Surgeon: Toledo, Benay Pike, MD;  Location: [facility];  Service: Gastroenterology;  Laterality: N/A;  [place] DID RAPID TEST; COPY ON CHART   ESOPHAGOGASTRODUODENOSCOPY (EGD) WITH PROPOFOL N/A 10/22/2019   Procedure: ESOPHAGOGASTRODUODENOSCOPY (EGD) WITH PROPOFOL;  Surgeon: Toledo, Benay Pike, MD;  Location: [facility];  Service: Gastroenterology;  Laterality: N/A;   HAMMER TOE SURGERY Right 04/22/2019   Procedure: HAMMER TOE CORRECTION X 4;  Surgeon: Caroline More, DPM;  Location: [place];  Service: Podiatry;  Laterality: Right;  Diabetic - insulin and oral meds   LAPAROSCOPIC GASTRECTOMY  05/31/2017   [facility], Dr. Darnell Level   LUMBAR LAMINECTOMY/DECOMPRESSION MICRODISCECTOMY Right 05/07/2013   Procedure: Right Lumbar one-two laminectomy;  Surgeon: Elaina Hoops, MD;  Location: [place] NEURO ORS;  Service: Neurosurgery;  Laterality: Right;   LUMBAR LAMINECTOMY/DECOMPRESSION MICRODISCECTOMY  Left 10/29/2013   Procedure: Left Lumbar five-Sacral one Laminectomy;  Surgeon: Elaina Hoops, MD;  Location: [place] NEURO ORS;  Service: Neurosurgery;  Laterality: Left;   LUMBAR WOUND DEBRIDEMENT N/A 01/25/2014   Procedure: LUMBAR WOUND DEBRIDEMENT;  Surgeon: Charlie Pitter, MD;  Location: [place] [facility]UR[facility]S;  Service: Neurosurgery;  Laterality: N/A;   LUMBAR WOUND DEBRIDEMENT N/A 02/25/2014   Procedure: LUMBAR WOUND DEBRIDEMENT;  Surgeon: Elaina Hoops, MD;  Location: [place] NEURO ORS;  Service: Neurosurgery;  Laterality: N/A;   LYSIS OF ADHESION N/A 03/08/2018   Procedure: LYSIS OF ADHESION;  Surgeon: Benjaman Kindler, MD;  Location: [facility];  Service: Gynecology;  Laterality: N/A;   SALPINGOOPHORECTOMY Bilateral    1 1997. 2nd 2001   SHOULDER ARTHROSCOPY WITH BICEPSTENOTOMY Left 03/24/2016   Procedure: shoulder arthroscopy with biceps  TENOTOMY, removal loose body, limited synovectomy;  Surgeon: Leanor Kail, MD;  Location: [facility];  Service: Orthopedics;  Laterality: Left;   shoulder sugery     7/17 rotator cuff   SMALL BOWEL REPAIR N/A 03/08/2018   Procedure: SMALL BOWEL REPAIR;  Surgeon: Benjaman Kindler, MD;  Location: [facility];  Service: Gynecology;  Laterality: N/A;    Prior to Admission medications   Medication Sig Start Date End Date Taking? Authorizing Provider  albuterol (VENTOLIN HFA) 108 (90 Base) MCG/ACT inhaler Inhale 2 puffs into the lungs every 6 (six) hours as needed for wheezing or shortness of breath. 09/03/21  Yes Prisilla Kocsis, Coralie Keens, NP  nirmatrelvir/ritonavir EUA (PAXLOVID) 20 x 150 MG & 10 x 100MG TABS Take 3 tablets by mouth 2 (two) times daily for 5 days. Patient GFR is > 60 Take nirmatrelvir (150 mg) 2 tabs BID for 5 days and ritonavir (100 mg) 1 tab BIDdaily for 5 days. 09/03/21 09/08/21 Yes Areen Trautner, Coralie Keens, NP  promethazine-dextromethorphan (PROMETHAZINE-DM) 6.25-15 MG/5ML syrup Take 5 mLs by mouth 4 (four) times daily as needed for cough. 09/03/21  Yes Sophiana Milanese, Coralie Keens, NP  acetaminophen (TYLENOL) 500 MG tablet Take 1,000 mg by mouth 2 (two) times daily as needed for moderate pain or headache.    [provider]  amitriptyline (ELAVIL) 50 MG tablet Take 50 mg by mouth at bedtime. Patient not taking: Reported on 12/08/2020    [provider]  atorvastatin (LIPITOR) 80 MG tablet Take 80 mg by mouth at bedtime. 08/21/19   [provider]  BIOTIN PO Take 1 tablet by mouth daily.    [provider]  cyanocobalamin (,VITAMIN B-12,) 1000 MCG/ML injection Inject 1,000 mcg into the muscle every 30 (thirty) days. 12/25/17   [provider]  ergocalciferol (VITAMIN D2) 1.25 MG (50000 UT) capsule Take 50,000 Units by mouth 2 (two) times a week.    [provider]  fluconazole (DIFLUCAN) 150 MG tablet Take 1 tablet PO, then repeat 3 days later if continued yeast  symptoms 03/08/21   Arta Silence, MD  gabapentin (NEURONTIN) 300 MG capsule Take 600-1,200 mg by mouth See admin instructions. Take 600 mg in the morning and afternoon, then take 1200 mg at bedtime    [provider]  insulin aspart (NOVOLOG FLEXPEN) 100 UNIT/ML FlexPen Inject 10-20 Units into the skin 3 (three) times daily with meals. Sliding scale 02/04/14   Kinnie Feil, MD  meclizine (ANTIVERT) 25 MG tablet Take 1 tablet (25 mg total) by mouth 3 (three) times daily as needed for dizziness. 12/12/17   Hedges, Dellis Filbert, PA-C  Multiple Vitamin (MULTIVITAMIN) tablet Take 1 tablet by mouth daily.  [provider]  NARCAN 4 MG/0.1ML LIQD nasal spray kit Place 1 spray into the nose once. If no response after 2 minutes repeat in opposite nostril. Patient not taking: Reported on 12/08/2020 01/15/18   [provider]  nitroGLYCERIN (NITROSTAT) 0.4 MG SL tablet Place 0.4 mg under the tongue every 5 (five) minutes as needed for chest pain.    [provider]  NON FORMULARY Floradix liquid 1 capful QD    [provider]  oxycodone (ROXICODONE) 30 MG immediate release tablet Take 15 mg by mouth every 4 (four) hours as needed for pain.    [provider]  OZEMPIC, 0.25 OR 0.5 MG/DOSE, 2 MG/1.5ML SOPN INJECT 0.375 MLS (0.5 MG TOTAL) SUBCUTANEOUSLY ONCE A WEEK FOR 30 DAYS 08/27/19   [provider]  pantoprazole (PROTONIX) 40 MG tablet TAKE 1 TABLET (40 MG TOTAL) BY MOUTH 2 (TWO) TIMES DAILY BEFORE MEALS FOR 30 DAYS Patient not taking: Reported on 12/08/2020 09/26/19   [provider]  sitaGLIPtin (JANUVIA) 100 MG tablet Take 100 mg by mouth daily. Patient not taking: Reported on 12/08/2020    [provider]  sucralfate (CARAFATE) 1 GM/10ML suspension Take by mouth. Patient not taking: Reported on 12/08/2020 09/27/19 10/27/19  [provider]  sucralfate (CARAFATE) 1 GM/10ML suspension Take by mouth. Patient not taking:  No sig reported    [provider]  tiZANidine (ZANAFLEX) 4 MG tablet Limit 1 tab by mouth per day or 2-3 times per day if tolerated Patient taking differently: Take 4 mg by mouth 3 (three) times daily. 04/19/16   Mohammed Kindle, MD  traZODone (DESYREL) 50 MG tablet Take 50 mg by mouth at bedtime.  Patient not taking: Reported on 12/08/2020 11/26/17   [provider]  triamcinolone cream (KENALOG) 0.1 % Apply 1 application topically 2 (two) times daily. Avoid face, groin, underarms. 02/05/20   Ralene Bathe, MD    Allergies Other, Bactrim [sulfamethoxazole-trimethoprim], Ciprofloxacin, Vancomycin, Amoxicillin, Erythromycin, Nsaids, Azithromycin, Ceftriaxone, and Food  Family History  Problem Relation Age of Onset   Depression Mother    Drug abuse Mother    Early death Mother    Hypertension Mother    Varicose Veins Mother    CVA Mother    Diabetes Father    Early death Father    Hyperlipidemia Father    Heart Problems Father        enlarged geart   Breast cancer Maternal Grandmother        60's   Breast cancer Maternal Aunt    Lung cancer Maternal Grandfather     Social History Social History   Tobacco Use   Smoking status: Never   Smokeless tobacco: Never  Vaping Use   Vaping Use: Never used  Substance Use Topics   Alcohol use: No   Drug use: No    Review of Systems  Constitutional: Positive for fever, chills and body aches Eyes: Negative for visual changes. ENT: Positive for sore throat.  Negative for runny nose, nasal congestion or ear pain Cardiovascular: Negative for chest pain or chest tightness. Respiratory: Positive for cough.  Negative for shortness of breath. Gastrointestinal: Negative for nausea, abdominal pain, vomiting and diarrhea. Neurological: Positive for headache.  Negative for dizziness, weakness. ____________________________________________  PHYSICAL EXAM:  VITAL SIGNS: ED Triage Vitals  Enc Vitals Group     BP 09/03/21  0923 117/76     Pulse Rate 09/03/21 0923 (!) 121     Resp 09/03/21 0923 20  Temp 09/03/21 0923 (!) 103 F (39.4 C)     Temp Source 09/03/21 0923 Oral     SpO2 09/03/21 0923 99 %     Weight 09/03/21 0921 151 lb (68.5 kg)     Height 09/03/21 0921 5' 4.5" (1.638 m)     Head Circumference --      Peak Flow --      Pain Score 09/03/21 0921 8     Pain Loc --      Pain Edu? --      Excl. in [place]? --     Constitutional: Alert and oriented.  Appears unwell but in NAD Head: Normocephalic. Eyes: Conjunctivae are normal. PERRL. Normal extraocular movements Hematological/Lymphatic/Immunological: No cervical lymphadenopathy. Cardiovascular: Tachycardic regular rhythm.  Respiratory: Normal respiratory effort.  Bilateral expiratory wheezing noted. Musculoskeletal: No difficulty with gait. Neurologic:  Normal speech and language. No gross focal neurologic deficits are appreciated. Skin:  Skin is warm, dry and intact. No rash noted.  ____________________________________________   LABS Labs Reviewed  RESP PANEL BY RT-PCR (FLU A&B, COVID) ARPGX2 - Abnormal; Notable for the following components:      Result Value   SARS Coronavirus 2 by RT PCR POSITIVE (*)    All other components within normal limits  CBC - Abnormal; Notable for the following components:   Platelets 148 (*)    All other components within normal limits  BASIC METABOLIC PANEL - Abnormal; Notable for the following components:   Glucose, Bld 131 (*)    Creatinine, Ser 1.04 (*)    All other components within normal limits    ____________________________________________  EKG ED ECG REPORT   Date: 09/03/2021  EKG Time: 1:40 PM  Rate: 129  Rhythm: sinus tachycardia,  sinus tachycardia  Axis: normal  Intervals:none  ST&T Change: none  Narrative Interpretation: Sinus tachycardia   ____________________________________________   RADIOLOGY Imaging Orders         DG Chest 2 View    IMPRESSION: No acute intrathoracic  process identified. ____________________________________________    INITIAL IMPRESSION / ASSESSMENT AND PLAN / ED COURSE  Headache, sore throat, cough, fever, chills and body aches:  DDx include COVID, flu, RSV, acute bronchitis, pneumonia, viral URI with cough COVID/flu swab positive for COVID CBC does not show signs of leukocytosis. Be met without concern for dehydration ECG shows sinus tachycardia without acute changes Chest x-ray negative for pneumonia Tylenol given Rx for Paxlovid 2 tabs twice daily x5 days-she reports the only medication she is taking at this time is Ozempic so she will not need to hold any medications Rx for Promethazine DM cough syrup-sedation caution given Rx for albuterol inhaler 1 to 2 puffs every 4-6 hours as needed for wheezing, shortness of breath Follow-up with your PCP as an outpatient, return to the ER for worsening symptoms. ____________________________________________  FINAL CLINICAL IMPRESSION(S) / ED DIAGNOSES  Final diagnoses:  DTOIZ-12      Jearld Fenton, NP 09/03/21 [place]    Blake Divine, MD 09/03/21 1539

## 2021-09-03 NOTE — Discharge Instructions (Addendum)
You were seen today for URI symptoms.  You tested positive for COVID.  Your chest x-ray and lab work were normal. You can take Tylenol or ibuprofen over-the-counter as needed for fever and body aches.  I am sending in an oral antiviral for you to take twice daily for the next 5 days.  I am also sending in cough syrup and an albuterol inhaler.  Please follow-up with your PCP if symptoms persist.

## 2021-09-27 ENCOUNTER — Other Ambulatory Visit: Payer: Self-pay | Admitting: *Deleted

## 2021-09-27 DIAGNOSIS — D509 Iron deficiency anemia, unspecified: Secondary | ICD-10-CM

## 2021-10-05 ENCOUNTER — Telehealth: Payer: Self-pay | Admitting: Oncology

## 2021-10-05 ENCOUNTER — Inpatient Hospital Stay: Payer: 59 | Attending: Oncology

## 2021-10-05 NOTE — Telephone Encounter (Signed)
Called pt to reschedule missed lab appt on 2/22. Unable to leave VM. Will send MyChart message.

## 2022-01-02 ENCOUNTER — Inpatient Hospital Stay: Payer: 59 | Attending: Oncology

## 2022-01-02 DIAGNOSIS — Z886 Allergy status to analgesic agent status: Secondary | ICD-10-CM | POA: Insufficient documentation

## 2022-01-02 DIAGNOSIS — Z833 Family history of diabetes mellitus: Secondary | ICD-10-CM | POA: Diagnosis not present

## 2022-01-02 DIAGNOSIS — Z803 Family history of malignant neoplasm of breast: Secondary | ICD-10-CM | POA: Diagnosis not present

## 2022-01-02 DIAGNOSIS — Z9049 Acquired absence of other specified parts of digestive tract: Secondary | ICD-10-CM | POA: Diagnosis not present

## 2022-01-02 DIAGNOSIS — Z880 Allergy status to penicillin: Secondary | ICD-10-CM | POA: Insufficient documentation

## 2022-01-02 DIAGNOSIS — Z823 Family history of stroke: Secondary | ICD-10-CM | POA: Insufficient documentation

## 2022-01-02 DIAGNOSIS — Z881 Allergy status to other antibiotic agents status: Secondary | ICD-10-CM | POA: Diagnosis not present

## 2022-01-02 DIAGNOSIS — K5731 Diverticulosis of large intestine without perforation or abscess with bleeding: Secondary | ICD-10-CM | POA: Insufficient documentation

## 2022-01-02 DIAGNOSIS — I252 Old myocardial infarction: Secondary | ICD-10-CM | POA: Insufficient documentation

## 2022-01-02 DIAGNOSIS — Z79899 Other long term (current) drug therapy: Secondary | ICD-10-CM | POA: Insufficient documentation

## 2022-01-02 DIAGNOSIS — Z8249 Family history of ischemic heart disease and other diseases of the circulatory system: Secondary | ICD-10-CM | POA: Insufficient documentation

## 2022-01-02 DIAGNOSIS — D500 Iron deficiency anemia secondary to blood loss (chronic): Secondary | ICD-10-CM | POA: Insufficient documentation

## 2022-01-02 DIAGNOSIS — D509 Iron deficiency anemia, unspecified: Secondary | ICD-10-CM

## 2022-01-02 DIAGNOSIS — Z814 Family history of other substance abuse and dependence: Secondary | ICD-10-CM | POA: Insufficient documentation

## 2022-01-02 DIAGNOSIS — Z818 Family history of other mental and behavioral disorders: Secondary | ICD-10-CM | POA: Insufficient documentation

## 2022-01-02 DIAGNOSIS — Z8349 Family history of other endocrine, nutritional and metabolic diseases: Secondary | ICD-10-CM | POA: Diagnosis not present

## 2022-01-02 DIAGNOSIS — Z801 Family history of malignant neoplasm of trachea, bronchus and lung: Secondary | ICD-10-CM | POA: Diagnosis not present

## 2022-01-02 DIAGNOSIS — Z90721 Acquired absence of ovaries, unilateral: Secondary | ICD-10-CM | POA: Insufficient documentation

## 2022-01-02 DIAGNOSIS — Z9884 Bariatric surgery status: Secondary | ICD-10-CM | POA: Insufficient documentation

## 2022-01-02 LAB — CBC WITH DIFFERENTIAL/PLATELET
Abs Immature Granulocytes: 0.02 10*3/uL (ref 0.00–0.07)
Basophils Absolute: 0 10*3/uL (ref 0.0–0.1)
Basophils Relative: 0 %
Eosinophils Absolute: 0 10*3/uL (ref 0.0–0.5)
Eosinophils Relative: 1 %
HCT: 41.4 % (ref 36.0–46.0)
Hemoglobin: 13.1 g/dL (ref 12.0–15.0)
Immature Granulocytes: 0 %
Lymphocytes Relative: 24 %
Lymphs Abs: 1.1 10*3/uL (ref 0.7–4.0)
MCH: 29.5 pg (ref 26.0–34.0)
MCHC: 31.6 g/dL (ref 30.0–36.0)
MCV: 93.2 fL (ref 80.0–100.0)
Monocytes Absolute: 0.3 10*3/uL (ref 0.1–1.0)
Monocytes Relative: 7 %
Neutro Abs: 3.1 10*3/uL (ref 1.7–7.7)
Neutrophils Relative %: 68 %
Platelets: 156 10*3/uL (ref 150–400)
RBC: 4.44 MIL/uL (ref 3.87–5.11)
RDW: 13.3 % (ref 11.5–15.5)
WBC: 4.5 10*3/uL (ref 4.0–10.5)
nRBC: 0 % (ref 0.0–0.2)

## 2022-01-02 LAB — IRON AND TIBC
Iron: 79 ug/dL (ref 28–170)
Saturation Ratios: 19 % (ref 10.4–31.8)
TIBC: 423 ug/dL (ref 250–450)
UIBC: 344 ug/dL

## 2022-01-02 LAB — FERRITIN: Ferritin: 83 ng/mL (ref 11–307)

## 2022-01-03 ENCOUNTER — Encounter: Payer: Self-pay | Admitting: Oncology

## 2022-01-03 ENCOUNTER — Inpatient Hospital Stay (HOSPITAL_BASED_OUTPATIENT_CLINIC_OR_DEPARTMENT_OTHER): Payer: 59 | Admitting: Oncology

## 2022-01-03 DIAGNOSIS — Z903 Acquired absence of stomach [part of]: Secondary | ICD-10-CM

## 2022-01-03 DIAGNOSIS — D500 Iron deficiency anemia secondary to blood loss (chronic): Secondary | ICD-10-CM | POA: Diagnosis not present

## 2022-01-03 NOTE — Progress Notes (Signed)
HEMATOLOGY-ONCOLOGY TeleHEALTH VISIT PROGRESS NOTE  I connected with Marissa [name] on 01/03/22 at  2:45 PM EDT by video enabled telemedicine visit and verified that I am speaking with the correct person using two identifiers. I discussed the limitations, risks, security and privacy concerns of performing an evaluation and management service by telemedicine and the availability of in-person appointments. The patient expressed understanding and agreed to proceed.   Other persons participating in the visit and their role in the encounter:  None  Patient's location: Home  Provider's location: office Chief Complaint: iron deficiency anemia.   PERTINENT HEMATOLOGY HISTORY admitted in April 2021 at [facility] due to abdominal pain, AKI. and had enteroscopy done on 12/11/2019. No specimen was collected. The area of ulceration and stenosis could not be reached with this antegrade approach. 12/15/2019 enteroscopy examined portion of the ileum was normal, diverticulosis in the left colon.  Patient follows up with bariatric surgery.  Patient has a history of gastric sleeve.  INTERVAL HISTORY Marissa [name] is a 46 y.o. female who has above history reviewed by me today presents for follow up visit for management of iron deficiency anemia.  Patient reports feeling well.  No new complaints.  Review of Systems  Constitutional:  Negative for appetite change, chills, fatigue and fever.  HENT:   Negative for hearing loss and voice change.   Eyes:  Negative for eye problems.  Respiratory:  Negative for chest tightness and cough.   Cardiovascular:  Negative for chest pain.  Gastrointestinal:  Negative for abdominal distention, abdominal pain and blood in stool.  Endocrine: Negative for hot flashes.  Genitourinary:  Negative for difficulty urinating and frequency.   Musculoskeletal:  Negative for arthralgias.  Skin:  Negative for itching and rash.  Neurological:  Negative for extremity weakness.  Hematological:   Negative for adenopathy.  Psychiatric/Behavioral:  Negative for confusion.    Past Medical History:  Diagnosis Date   Allergy    Anemia    Anxiety    Arthritis    Back pain, chronic    Cardiomyopathy, dilated, nonischemic (HCC)    CHF (congestive heart failure) (HCC)    Cholelithiasis    Coronary artery disease    Depression    Diabetes mellitus without complication (HCC)    fasting cbg 50-140s   Dysrhythmia    tachycardia   GERD (gastroesophageal reflux disease)    otc meds   Headache(784.0)    Hypercholesteremia    Hyperlipemia    Hypertension    IDA (iron deficiency anemia) 09/29/2019   Insulin pump in place    pt had insulin pump but it is now removed (02-18-16)   MI, old    2006   Migraines    once/month maybe - can last up to two weeks   Myocardial infarction [facility]) 2006   "due to medication"; no evidence of ischemia or infarction by nuclear stress test '11   Neck pain, chronic    Neuropathy    legs   Pneumonia 2015   [facility]   Prolonged QT interval syndrome    Renal insufficiency    Restless leg syndrome, controlled    Sepsis ([place]) 2016   secondary to surgery   Sleep apnea    does not use cpap since losing alot of weight   Vertigo    nop episodes 2020   Vision loss    due to diabetes   Wears contact lenses    Wears dentures    partial lower   Past  Surgical History:  Procedure Laterality Date   ACHILLES TENDON SURGERY Right 04/22/2019   Procedure: Vincente Liberty PROCEDURE WITH SUTURE ANCHOR;  Surgeon: Caroline More, DPM;  Location: [place];  Service: Podiatry;  Laterality: Right;   APPENDECTOMY N/A 03/08/2018   Procedure: APPENDECTOMY;  Surgeon: Benjaman Kindler, MD;  Location: [facility];  Service: Gynecology;  Laterality: N/A;  By Dr. Loreli Dollar SURGERY  2011   Lumbar    BOWEL RESECTION N/A 03/08/2018   Procedure: SMALL BOWEL RESECTION;  Surgeon: Benjaman Kindler, MD;  Location: [facility];  Service: Gynecology;  Laterality: N/A;   CARDIAC  CATHETERIZATION  07/18/2004   50-60% mid LAD, minor luminal irregularities RCA, normal LM and CX, EF 50-55% [facility])   carpel tunnel Bilateral    CERVICAL FUSION  2006   CHOLECYSTECTOMY N/A 04/08/2015   Procedure: LAPAROSCOPIC CHOLECYSTECTOMY WITH INTRAOPERATIVE CHOLANGIOGRAM;  Surgeon: Florene Glen, MD;  Location: [facility];  Service: General;  Laterality: N/A;   COLON RESECTION N/A 03/16/2018   Procedure: COLON RESECTION;  Surgeon: Florene Glen, MD;  Location: [facility];  Service: General;  Laterality: N/A;   COLONOSCOPY WITH PROPOFOL N/A 10/22/2019   Procedure: COLONOSCOPY WITH PROPOFOL;  Surgeon: Toledo, Benay Pike, MD;  Location: [facility];  Service: Gastroenterology;  Laterality: N/A;  [place] DID RAPID TEST; COPY ON CHART   ESOPHAGOGASTRODUODENOSCOPY (EGD) WITH PROPOFOL N/A 10/22/2019   Procedure: ESOPHAGOGASTRODUODENOSCOPY (EGD) WITH PROPOFOL;  Surgeon: Toledo, Benay Pike, MD;  Location: [facility];  Service: Gastroenterology;  Laterality: N/A;   HAMMER TOE SURGERY Right 04/22/2019   Procedure: HAMMER TOE CORRECTION X 4;  Surgeon: Caroline More, DPM;  Location: [place];  Service: Podiatry;  Laterality: Right;  Diabetic - insulin and oral meds   LAPAROSCOPIC GASTRECTOMY  05/31/2017   [facility], Dr. Darnell Level   LUMBAR LAMINECTOMY/DECOMPRESSION MICRODISCECTOMY Right 05/07/2013   Procedure: Right Lumbar one-two laminectomy;  Surgeon: Elaina Hoops, MD;  Location: [place] [facility];  Service: Neurosurgery;  Laterality: Right;   LUMBAR LAMINECTOMY/DECOMPRESSION MICRODISCECTOMY Left 10/29/2013   Procedure: Left Lumbar five-Sacral one Laminectomy;  Surgeon: Elaina Hoops, MD;  Location: [place] [facility];  Service: Neurosurgery;  Laterality: Left;   LUMBAR WOUND DEBRIDEMENT N/A 01/25/2014   Procedure: LUMBAR WOUND DEBRIDEMENT;  Surgeon: Charlie Pitter, MD;  Location: [place] [facility];  Service: Neurosurgery;  Laterality: N/A;   LUMBAR WOUND DEBRIDEMENT N/A 02/25/2014   Procedure: LUMBAR  WOUND DEBRIDEMENT;  Surgeon: Elaina Hoops, MD;  Location: [place] [facility];  Service: Neurosurgery;  Laterality: N/A;   LYSIS OF ADHESION N/A 03/08/2018   Procedure: LYSIS OF ADHESION;  Surgeon: Benjaman Kindler, MD;  Location: [facility];  Service: Gynecology;  Laterality: N/A;   SALPINGOOPHORECTOMY Bilateral    1 1997. 2nd 2001   SHOULDER ARTHROSCOPY WITH BICEPSTENOTOMY Left 03/24/2016   Procedure: shoulder arthroscopy with biceps TENOTOMY, removal loose body, limited synovectomy;  Surgeon: Leanor Kail, MD;  Location: [facility];  Service: Orthopedics;  Laterality: Left;   shoulder sugery     7/17 rotator cuff   SMALL BOWEL REPAIR N/A 03/08/2018   Procedure: SMALL BOWEL REPAIR;  Surgeon: Benjaman Kindler, MD;  Location: [facility];  Service: Gynecology;  Laterality: N/A;    Family History  Problem Relation Age of Onset   Depression Mother    Drug abuse Mother    Early death Mother    Hypertension Mother    Varicose Veins Mother    CVA Mother    Diabetes Father  Early death Father    Hyperlipidemia Father    Heart Problems Father        enlarged geart   Breast cancer Maternal Grandmother        60's   Breast cancer Maternal Aunt    Lung cancer Maternal Grandfather     Social History   Socioeconomic History   Marital status: Divorced    Spouse name: Not on file   Number of children: 0   Years of education: College   Highest education level: Not on file  Occupational History   Occupation: Glass blower/designer  Tobacco Use   Smoking status: Never   Smokeless tobacco: Never  Vaping Use   Vaping Use: Never used  Substance and Sexual Activity   Alcohol use: No   Drug use: No   Sexual activity: Yes    Birth control/protection: Surgical  Other Topics Concern   Not on file  Social History Narrative   Lives at home with her fiance.   Right-handed.   Occasional caffeine use.   Social Determinants of Health   Financial Resource Strain: Not on file  Food Insecurity: Not on file   Transportation Needs: Not on file  Physical Activity: Not on file  Stress: Not on file  Social Connections: Not on file  Intimate Partner Violence: Not on file    Current Outpatient Medications on File Prior to Visit  Medication Sig Dispense Refill   acetaminophen (TYLENOL) 500 MG tablet Take 1,000 mg by mouth 2 (two) times daily as needed for moderate pain or headache.     albuterol (VENTOLIN HFA) 108 (90 Base) MCG/ACT inhaler Inhale 2 puffs into the lungs every 6 (six) hours as needed for wheezing or shortness of breath. 8 g 0   atorvastatin (LIPITOR) 80 MG tablet Take 80 mg by mouth at bedtime.     BIOTIN PO Take 1 tablet by mouth daily.     cyanocobalamin (,VITAMIN B-12,) 1000 MCG/ML injection Inject 1,000 mcg into the muscle every 30 (thirty) days.  3   ergocalciferol (VITAMIN D2) 1.25 MG (50000 UT) capsule Take 50,000 Units by mouth 2 (two) times a week.     fluconazole (DIFLUCAN) 150 MG tablet Take 1 tablet PO, then repeat 3 days later if continued yeast symptoms 2 tablet 0   gabapentin (NEURONTIN) 300 MG capsule Take 600-1,200 mg by mouth See admin instructions. Take 600 mg in the morning and afternoon, then take 1200 mg at bedtime     insulin aspart (NOVOLOG FLEXPEN) 100 UNIT/ML FlexPen Inject 10-20 Units into the skin 3 (three) times daily with meals. Sliding scale 15 mL 11   meclizine (ANTIVERT) 25 MG tablet Take 1 tablet (25 mg total) by mouth 3 (three) times daily as needed for dizziness. 30 tablet 0   Multiple Vitamin (MULTIVITAMIN) tablet Take 1 tablet by mouth daily.     nitroGLYCERIN (NITROSTAT) 0.4 MG SL tablet Place 0.4 mg under the tongue every 5 (five) minutes as needed for chest pain.     NON FORMULARY Floradix liquid 1 capful QD     oxycodone (ROXICODONE) 30 MG immediate release tablet Take 15 mg by mouth every 4 (four) hours as needed for pain.     OZEMPIC, 0.25 OR 0.5 MG/DOSE, 2 MG/1.5ML SOPN INJECT 0.375 MLS (0.5 MG TOTAL) SUBCUTANEOUSLY ONCE A WEEK FOR 30 DAYS      tiZANidine (ZANAFLEX) 4 MG tablet Limit 1 tab by mouth per day or 2-3 times per day if tolerated (Patient taking  differently: Take 4 mg by mouth 3 (three) times daily.) 90 tablet 0   amitriptyline (ELAVIL) 50 MG tablet Take 50 mg by mouth at bedtime. (Patient not taking: Reported on 12/08/2020)     NARCAN 4 MG/0.1ML LIQD nasal spray kit Place 1 spray into the nose once. If no response after 2 minutes repeat in opposite nostril. (Patient not taking: Reported on 12/08/2020)  0   pantoprazole (PROTONIX) 40 MG tablet TAKE 1 TABLET (40 MG TOTAL) BY MOUTH 2 (TWO) TIMES DAILY BEFORE MEALS FOR 30 DAYS (Patient not taking: Reported on 12/08/2020)     promethazine-dextromethorphan (PROMETHAZINE-DM) 6.25-15 MG/5ML syrup Take 5 mLs by mouth 4 (four) times daily as needed for cough. (Patient not taking: Reported on 01/03/2022) 118 mL 0   sitaGLIPtin (JANUVIA) 100 MG tablet Take 100 mg by mouth daily. (Patient not taking: Reported on 12/08/2020)     sucralfate (CARAFATE) 1 GM/10ML suspension Take by mouth. (Patient not taking: Reported on 12/08/2020)     sucralfate (CARAFATE) 1 GM/10ML suspension Take by mouth. (Patient not taking: Reported on 07/28/2020)     traZODone (DESYREL) 50 MG tablet Take 50 mg by mouth at bedtime.  (Patient not taking: Reported on 12/08/2020)  1   triamcinolone cream (KENALOG) 0.1 % Apply 1 application topically 2 (two) times daily. Avoid face, groin, underarms. (Patient not taking: Reported on 01/03/2022) 80 g 2   No current facility-administered medications on file prior to visit.    Allergies  Allergen Reactions   Other Hives, Rash and Other (See Comments)    Greek yogurt only, gastric by pass  [place] yogurt only   Bactrim [Sulfamethoxazole-Trimethoprim] Nausea And Vomiting   Ciprofloxacin Swelling   Vancomycin Nausea And Vomiting   Amoxicillin Nausea And Vomiting    Patient tolerated Zosyn during the whole admission  Has patient had a PCN reaction causing immediate rash,  facial/tongue/throat swelling, SOB or lightheadedness with hypotension: No Has patient had a PCN reaction causing severe rash involving mucus membranes or skin necrosis: No Has patient had a PCN reaction that required hospitalization: No Has patient had a PCN reaction occurring within the last 10 years: Yes If all of the above answers are "NO", then may proceed with Cephalosporin use.    Erythromycin Itching   Nsaids     Avoids due to gastric bypass   Azithromycin Itching and Rash   Ceftriaxone Nausea And Vomiting   Food Itching and Rash    "Greek yogurt"       Observations/Objective: Today's Vitals   01/03/22 1258  PainSc: 0-No pain   There is no height or weight on file to calculate BMI.  Physical Exam Neurological:     Mental Status: She is alert.    CBC    Component Value Date/Time   WBC 4.5 01/02/2022 1306   RBC 4.44 01/02/2022 1306   HGB 13.1 01/02/2022 1306   HGB 12.2 07/01/2014 1144   HCT 41.4 01/02/2022 1306   HCT 38.0 07/01/2014 1144   PLT 156 01/02/2022 1306   PLT 193 07/01/2014 1144   MCV 93.2 01/02/2022 1306   MCV 84 07/01/2014 1144   MCH 29.5 01/02/2022 1306   MCHC 31.6 01/02/2022 1306   RDW 13.3 01/02/2022 1306   RDW 16.8 (H) 07/01/2014 1144   LYMPHSABS 1.1 01/02/2022 1306   LYMPHSABS 1.4 03/20/2014 0139   MONOABS 0.3 01/02/2022 1306   MONOABS 0.6 03/20/2014 0139   EOSABS 0.0 01/02/2022 1306   EOSABS 0.3 03/20/2014 0139   BASOSABS  0.0 01/02/2022 1306   BASOSABS 0.0 03/20/2014 0139    CMP     Component Value Date/Time   NA 139 09/03/2021 0926   NA 138 07/01/2014 1144   K 4.2 09/03/2021 0926   K 4.4 07/01/2014 1144   CL 102 09/03/2021 0926   CL 105 07/01/2014 1144   CO2 28 09/03/2021 0926   CO2 28 07/01/2014 1144   GLUCOSE 131 (H) 09/03/2021 0926   GLUCOSE 103 (H) 07/01/2014 1144   BUN 11 09/03/2021 0926   BUN 13 07/01/2014 1144   CREATININE 1.04 (H) 09/03/2021 0926   CREATININE 0.71 09/22/2014 1140   CALCIUM 9.1 09/03/2021 0926    CALCIUM 9.3 07/01/2014 1144   PROT 6.9 05/24/2021 2008   PROT 8.2 07/01/2014 1144   ALBUMIN 3.8 05/24/2021 2008   ALBUMIN 3.5 07/01/2014 1144   AST 23 05/24/2021 2008   AST 45 (H) 07/01/2014 1144   ALT 17 05/24/2021 2008   ALT 60 07/01/2014 1144   ALKPHOS 82 05/24/2021 2008   ALKPHOS 136 (H) 07/01/2014 1144   BILITOT 0.8 05/24/2021 2008   BILITOT 0.3 07/01/2014 1144   GFRNONAA >60 09/03/2021 0926   GFRNONAA >60 07/01/2014 1144   GFRNONAA 58 (L) 03/20/2014 0139   GFRAA >60 03/09/2020 0030   GFRAA >60 07/01/2014 1144   GFRAA >60 03/20/2014 0139     Assessment and Plan: 1. Iron deficiency anemia due to chronic blood loss   2. H/O gastric sleeve     #Iron deficiency anemia, in the context of gastric sleeve and history of gastric bleeding. Labs reviewed and discussed with patient CBC showed a normal hemoglobin.  Iron saturation 19, ferritin 83. I recommend IV Venofer maintenance x1.   Follow Up Instructions: Follow up in 6 months,    I discussed the assessment and treatment plan with the patient. The patient was provided an opportunity to ask questions and all were answered. The patient agreed with the plan and demonstrated an understanding of the instructions.  The patient was advised to call back or seek an in-person evaluation if the symptoms worsen or if the condition fails to improve as anticipated.    Earlie Server, MD 01/03/2022 10:58 PM

## 2022-01-04 ENCOUNTER — Encounter: Payer: Self-pay | Admitting: Nurse Practitioner

## 2022-01-18 ENCOUNTER — Encounter: Payer: Self-pay | Admitting: Medical Oncology

## 2022-01-18 ENCOUNTER — Emergency Department: Payer: 59

## 2022-01-18 ENCOUNTER — Emergency Department
Admission: EM | Admit: 2022-01-18 | Discharge: 2022-01-18 | Disposition: A | Discharge status: Home / Self Care | Payer: 59 | Attending: Emergency Medicine | Admitting: Emergency Medicine

## 2022-01-18 ENCOUNTER — Ambulatory Visit: Payer: 59

## 2022-01-18 DIAGNOSIS — R200 Anesthesia of skin: Secondary | ICD-10-CM

## 2022-01-18 DIAGNOSIS — R202 Paresthesia of skin: Secondary | ICD-10-CM | POA: Insufficient documentation

## 2022-01-18 DIAGNOSIS — M542 Cervicalgia: Secondary | ICD-10-CM | POA: Diagnosis not present

## 2022-01-18 LAB — COMPREHENSIVE METABOLIC PANEL
ALT: 13 U/L (ref 0–44)
AST: 21 U/L (ref 15–41)
Albumin: 3.2 g/dL — ABNORMAL LOW (ref 3.5–5.0)
Alkaline Phosphatase: 87 U/L (ref 38–126)
Anion gap: 4 — ABNORMAL LOW (ref 5–15)
BUN: 9 mg/dL (ref 6–20)
CO2: 29 mmol/L (ref 22–32)
Calcium: 8.9 mg/dL (ref 8.9–10.3)
Chloride: 112 mmol/L — ABNORMAL HIGH (ref 98–111)
Creatinine, Ser: 0.77 mg/dL (ref 0.44–1.00)
GFR, Estimated: >60 mL/min (ref >60)
Glucose, Bld: 95 mg/dL (ref 70–99)
Potassium: 3.7 mmol/L (ref 3.5–5.1)
Sodium: 145 mmol/L (ref 135–145)
Total Bilirubin: 0.5 mg/dL (ref 0.3–1.2)
Total Protein: 6.2 g/dL — ABNORMAL LOW (ref 6.5–8.1)

## 2022-01-18 LAB — PROTIME-INR
INR: 1 (ref 0.8–1.2)
Prothrombin Time: 12.9 seconds (ref 11.4–15.2)

## 2022-01-18 LAB — DIFFERENTIAL
Abs Immature Granulocytes: 0.02 10*3/uL (ref 0.00–0.07)
Basophils Absolute: 0 10*3/uL (ref 0.0–0.1)
Basophils Relative: 0 %
Eosinophils Absolute: 0.1 10*3/uL (ref 0.0–0.5)
Eosinophils Relative: 1 %
Immature Granulocytes: 1 %
Lymphocytes Relative: 28 %
Lymphs Abs: 1.1 10*3/uL (ref 0.7–4.0)
Monocytes Absolute: 0.3 10*3/uL (ref 0.1–1.0)
Monocytes Relative: 8 %
Neutro Abs: 2.4 10*3/uL (ref 1.7–7.7)
Neutrophils Relative %: 62 %

## 2022-01-18 LAB — CBG MONITORING, ED: Glucose-Capillary: 107 mg/dL — ABNORMAL HIGH (ref 70–99)

## 2022-01-18 LAB — CBC
HCT: 37.9 % (ref 36.0–46.0)
Hemoglobin: 11.7 g/dL — ABNORMAL LOW (ref 12.0–15.0)
MCH: 29 pg (ref 26.0–34.0)
MCHC: 30.9 g/dL (ref 30.0–36.0)
MCV: 94 fL (ref 80.0–100.0)
Platelets: 150 10*3/uL (ref 150–400)
RBC: 4.03 MIL/uL (ref 3.87–5.11)
RDW: 13.5 % (ref 11.5–15.5)
WBC: 3.9 10*3/uL — ABNORMAL LOW (ref 4.0–10.5)
nRBC: 0 % (ref 0.0–0.2)

## 2022-01-18 LAB — APTT: aPTT: 27 seconds (ref 24–36)

## 2022-01-18 MED ORDER — MELOXICAM 15 MG PO TABS: 15.0000 mg | ORAL_TABLET | Freq: Every day | ORAL | 2 refills | Status: DC

## 2022-01-18 MED ORDER — LORAZEPAM 2 MG/ML IJ SOLN
1.0000 mg | Freq: Once | INTRAMUSCULAR | Status: AC
  Administered 2022-01-18: 1 mg via INTRAVENOUS
  Filled 2022-01-18: qty 1

## 2022-01-18 MED ORDER — SODIUM CHLORIDE 0.9% FLUSH
3.0000 mL | Freq: Once | INTRAVENOUS | Status: AC
  Administered 2022-01-18: 3 mL via INTRAVENOUS

## 2022-01-18 MED ORDER — CYCLOBENZAPRINE HCL 10 MG PO TABS: 10.0000 mg | ORAL_TABLET | Freq: Three times a day (TID) | ORAL | 0 refills | Status: DC | PRN

## 2022-01-18 MED ORDER — DIPHENHYDRAMINE HCL 50 MG/ML IJ SOLN
25.0000 mg | Freq: Once | INTRAMUSCULAR | Status: AC
  Administered 2022-01-18: 25 mg via INTRAVENOUS
  Filled 2022-01-18: qty 1

## 2022-01-18 NOTE — ED Triage Notes (Signed)
Pt from home, ambulatory to triage with reports of left sided numbness and tingling that began yesterday around 1400. Pt reports it started in her hand and worsened throughout the night. Pt not VAN positive on triage.

## 2022-01-19 ENCOUNTER — Inpatient Hospital Stay: Payer: 59 | Attending: Oncology

## 2022-01-19 VITALS — BP 150/96 | HR 95 | Temp 98.0°F

## 2022-01-19 DIAGNOSIS — D508 Other iron deficiency anemias: Secondary | ICD-10-CM | POA: Insufficient documentation

## 2022-01-19 DIAGNOSIS — D509 Iron deficiency anemia, unspecified: Secondary | ICD-10-CM

## 2022-01-19 DIAGNOSIS — Z79899 Other long term (current) drug therapy: Secondary | ICD-10-CM | POA: Diagnosis not present

## 2022-01-19 DIAGNOSIS — Z9884 Bariatric surgery status: Secondary | ICD-10-CM | POA: Diagnosis not present

## 2022-01-19 MED ORDER — SODIUM CHLORIDE 0.9 % IV SOLN
Freq: Once | INTRAVENOUS | Status: AC
  Filled 2022-01-19: qty 250

## 2022-01-19 MED ORDER — SODIUM CHLORIDE 0.9 % IV SOLN: 200.0000 mg | Freq: Once | INTRAVENOUS | Status: DC

## 2022-01-19 MED ORDER — IRON SUCROSE 20 MG/ML IV SOLN
200.0000 mg | Freq: Once | INTRAVENOUS | Status: AC
  Administered 2022-01-19: 200 mg via INTRAVENOUS
  Filled 2022-01-19: qty 10

## 2022-01-19 NOTE — Patient Instructions (Signed)

## 2022-01-21 NOTE — ED Provider Notes (Signed)
[facility] Provider Note   Event Date/Time   First MD Initiated Contact with Patient 01/18/22 0945     (approximate) History  Numbness  HPI Marissa [name] is a 46 y.o. female  Location: Left neck, upper extremity, and lower extremity Duration: began yesterday at 1400 Timing: Worsening since onset Severity: 5/10 Quality: Numbness/tingling Context: Patient states that she had numbness/tingling in her upper extremity that has now spread up her arm and mildly in the left lower extremity. Modifying factors: Patient denies any exacerbating or relieving factors Associated Symptoms: Left neck pain.  Patient does have history of cervical spinal fusion as well as discontinued pathology in the left aspect of her cervical spine ROS: Patient currently denies any vision changes, tinnitus, difficulty speaking, facial droop, sore throat, chest pain, shortness of breath, abdominal pain, nausea/vomiting/diarrhea, dysuria   Physical Exam  Triage Vital Signs: ED Triage Vitals [01/18/22 0940]  Enc Vitals Group     BP (!) 148/82     Pulse Rate 89     Resp 18     Temp 98 F (36.7 C)     Temp Source Oral     SpO2 100 %     Weight 143 lb (64.9 kg)     Height '5\' 4"'$  (1.626 m)     Head Circumference      Peak Flow      Pain Score 5     Pain Loc      Pain Edu?      Excl. in [place]?    Most recent vital signs: Vitals:   01/18/22 1200 01/18/22 1322  BP: (!) 138/124 129/88  Pulse: 81 91  Resp: 19 17  Temp:    SpO2: 100% 95%   General: Awake, oriented x4. CV:  Good peripheral perfusion.  Resp:  Normal effort.  Abd:  No distention.  Other:  Middle-aged African-American female laying in bed in no acute distress.  Subjective numbness over her left upper extremity.  Sensation intact over her left lower extremity, left face ED Results / Procedures / Treatments  Labs (all labs ordered are listed, but only abnormal results are displayed) Labs Reviewed  CBC - Abnormal;  Notable for the following components:      Result Value   WBC 3.9 (*)    Hemoglobin 11.7 (*)    All other components within normal limits  COMPREHENSIVE METABOLIC PANEL - Abnormal; Notable for the following components:   Chloride 112 (*)    Total Protein 6.2 (*)    Albumin 3.2 (*)    Anion gap 4 (*)    All other components within normal limits  CBG MONITORING, ED - Abnormal; Notable for the following components:   Glucose-Capillary 107 (*)    All other components within normal limits  PROTIME-INR  APTT  DIFFERENTIAL  I-STAT CREATININE, ED   EKG ED ECG REPORT I, Naaman Plummer, the attending physician, personally viewed and interpreted this ECG. Date: 01/21/2022 EKG Time: 1112 Rate: 89 Rhythm: normal sinus rhythm QRS Axis: normal Intervals: normal ST/T Wave abnormalities: normal Narrative Interpretation: no evidence of acute ischemia RADIOLOGY ED MD interpretation: CT of the head without contrast interpreted by me shows no evidence of acute abnormalities including no intracerebral hemorrhage, obvious masses, or significant edema  MRI of the brain and cervical spine interpreted by me did not show any evidence of acute abnormalities -Agree with radiology assessment Official radiology report(s): No results found. PROCEDURES: Critical Care performed: No Procedures MEDICATIONS  ORDERED IN ED: Medications  sodium chloride flush (NS) 0.9 % injection 3 mL (3 mLs Intravenous Given 01/18/22 1017)  LORazepam (ATIVAN) injection 1 mg (1 mg Intravenous Given 01/18/22 1208)  diphenhydrAMINE (BENADRYL) injection 25 mg (25 mg Intravenous Given 01/18/22 1207)   IMPRESSION / MDM / ASSESSMENT AND PLAN / ED COURSE  I reviewed the triage vital signs and the nursing notes.                             The patient is on the cardiac monitor to evaluate for evidence of arrhythmia and/or significant heart rate changes. Patient's presentation is most consistent with acute presentation with potential  threat to life or bodily function. Presents with sensation of tingling to the left upper extremity.  Patients symptoms and work-up not consistent with a stroke and therefore they will be discharged from the ED.  Patient has currently been stabilized in the emergency department.  Patient received an head CT and MRI that was negative for stroke. Possibly related to patient's cervical pathology and encouraged follow-up with patient spinal surgeon.  Patient agrees with plan Patients symptoms not typical for other emergent causes such as dissection, infection, DKA, trauma. Patient will be discharged with strict return precautions and follow up with primary MD within 24 hours for further evaluation.   FINAL CLINICAL IMPRESSION(S) / ED DIAGNOSES   Final diagnoses:  Left upper extremity numbness  Numbness and tingling of left lower extremity  Neck pain on left side   Rx / DC Orders   ED Discharge Orders          Ordered    meloxicam (MOBIC) 15 MG tablet  Daily        01/18/22 1351    cyclobenzaprine (FLEXERIL) 10 MG tablet  3 times daily PRN        01/18/22 1351           Note:  This document was prepared using Dragon voice recognition software and may include unintentional dictation errors.   Naaman Plummer, MD 01/21/22 [phone]

## 2022-02-15 DIAGNOSIS — I152 Hypertension secondary to endocrine disorders: Secondary | ICD-10-CM | POA: Diagnosis not present

## 2022-02-15 DIAGNOSIS — E1142 Type 2 diabetes mellitus with diabetic polyneuropathy: Secondary | ICD-10-CM | POA: Diagnosis not present

## 2022-02-15 DIAGNOSIS — E1169 Type 2 diabetes mellitus with other specified complication: Secondary | ICD-10-CM | POA: Diagnosis not present

## 2022-02-15 DIAGNOSIS — Z794 Long term (current) use of insulin: Secondary | ICD-10-CM | POA: Diagnosis not present

## 2022-02-15 DIAGNOSIS — E1159 Type 2 diabetes mellitus with other circulatory complications: Secondary | ICD-10-CM | POA: Diagnosis not present

## 2022-02-15 DIAGNOSIS — E785 Hyperlipidemia, unspecified: Secondary | ICD-10-CM | POA: Diagnosis not present

## 2022-04-17 ENCOUNTER — Emergency Department: Payer: 59

## 2022-04-17 ENCOUNTER — Encounter: Payer: Self-pay | Admitting: Emergency Medicine

## 2022-04-17 ENCOUNTER — Other Ambulatory Visit: Payer: Self-pay

## 2022-04-17 ENCOUNTER — Emergency Department
Admission: EM | Admit: 2022-04-17 | Discharge: 2022-04-17 | Disposition: A | Discharge status: Home / Self Care | Payer: 59 | Attending: Emergency Medicine | Admitting: Emergency Medicine

## 2022-04-17 ENCOUNTER — Encounter: Payer: Self-pay | Admitting: Nurse Practitioner

## 2022-04-17 DIAGNOSIS — E119 Type 2 diabetes mellitus without complications: Secondary | ICD-10-CM | POA: Diagnosis not present

## 2022-04-17 DIAGNOSIS — I509 Heart failure, unspecified: Secondary | ICD-10-CM | POA: Insufficient documentation

## 2022-04-17 DIAGNOSIS — R112 Nausea with vomiting, unspecified: Secondary | ICD-10-CM | POA: Diagnosis not present

## 2022-04-17 LAB — CBC
HCT: 39.5 % (ref 36.0–46.0)
Hemoglobin: 12.6 g/dL (ref 12.0–15.0)
MCH: 29.9 pg (ref 26.0–34.0)
MCHC: 31.9 g/dL (ref 30.0–36.0)
MCV: 93.8 fL (ref 80.0–100.0)
Platelets: 177 10*3/uL (ref 150–400)
RBC: 4.21 MIL/uL (ref 3.87–5.11)
RDW: 15.5 % (ref 11.5–15.5)
WBC: 5 10*3/uL (ref 4.0–10.5)
nRBC: 0 % (ref 0.0–0.2)

## 2022-04-17 LAB — COMPREHENSIVE METABOLIC PANEL
ALT: 25 U/L (ref 0–44)
AST: 27 U/L (ref 15–41)
Albumin: 3.8 g/dL (ref 3.5–5.0)
Alkaline Phosphatase: 98 U/L (ref 38–126)
Anion gap: 8 (ref 5–15)
BUN: 16 mg/dL (ref 6–20)
CO2: 24 mmol/L (ref 22–32)
Calcium: 9 mg/dL (ref 8.9–10.3)
Chloride: 105 mmol/L (ref 98–111)
Creatinine, Ser: 0.94 mg/dL (ref 0.44–1.00)
GFR, Estimated: >60 mL/min (ref >60)
Glucose, Bld: 114 mg/dL — ABNORMAL HIGH (ref 70–99)
Potassium: 4.5 mmol/L (ref 3.5–5.1)
Sodium: 137 mmol/L (ref 135–145)
Total Bilirubin: 1.1 mg/dL (ref 0.3–1.2)
Total Protein: 7.1 g/dL (ref 6.5–8.1)

## 2022-04-17 LAB — URINALYSIS, ROUTINE W REFLEX MICROSCOPIC
Bilirubin Urine: NEGATIVE
Glucose, UA: NEGATIVE mg/dL
Hgb urine dipstick: NEGATIVE
Ketones, ur: NEGATIVE mg/dL
Leukocytes,Ua: NEGATIVE
Nitrite: NEGATIVE
Protein, ur: NEGATIVE mg/dL
Specific Gravity, Urine: 1.012 (ref 1.005–1.030)
pH: 7 (ref 5.0–8.0)

## 2022-04-17 LAB — POC URINE PREG, ED: Preg Test, Ur: NEGATIVE

## 2022-04-17 LAB — LIPASE, BLOOD: Lipase: 31 U/L (ref 11–51)

## 2022-04-17 LAB — MAGNESIUM: Magnesium: 2.4 mg/dL (ref 1.7–2.4)

## 2022-04-17 MED ORDER — ACETAMINOPHEN 500 MG PO TABS
1000.0000 mg | ORAL_TABLET | ORAL | Status: AC
  Administered 2022-04-17: 1000 mg via ORAL
  Filled 2022-04-17: qty 2

## 2022-04-17 MED ORDER — ONDANSETRON HCL 4 MG/2ML IJ SOLN
4.0000 mg | Freq: Once | INTRAMUSCULAR | Status: AC
  Administered 2022-04-17: 4 mg via INTRAVENOUS
  Filled 2022-04-17: qty 2

## 2022-04-17 MED ORDER — PROMETHAZINE HCL 25 MG/ML IJ SOLN
12.5000 mg | INTRAMUSCULAR | Status: AC
  Administered 2022-04-17: 12.5 mg via INTRAMUSCULAR
  Filled 2022-04-17: qty 1

## 2022-04-17 MED ORDER — SCOPOLAMINE 1 MG/3DAYS TD PT72: 1.0000 | MEDICATED_PATCH | TRANSDERMAL | 0 refills | Status: DC

## 2022-04-17 MED ORDER — ONDANSETRON 4 MG PO TBDP: 4.0000 mg | ORAL_TABLET | Freq: Three times a day (TID) | ORAL | 0 refills | Status: DC | PRN

## 2022-04-17 MED ORDER — ONDANSETRON HCL 4 MG/2ML IJ SOLN
4.0000 mg | INTRAMUSCULAR | Status: AC
  Administered 2022-04-17: 4 mg via INTRAVENOUS
  Filled 2022-04-17: qty 2

## 2022-04-17 MED ORDER — MORPHINE SULFATE (PF) 4 MG/ML IV SOLN: 4.0000 mg | Freq: Once | INTRAVENOUS | Status: DC

## 2022-04-17 MED ORDER — SODIUM CHLORIDE 0.9 % IV BOLUS
500.0000 mL | Freq: Once | INTRAVENOUS | Status: AC
  Administered 2022-04-17: 500 mL via INTRAVENOUS

## 2022-04-17 NOTE — ED Provider Notes (Signed)
Patient reports after she had been eating crackers she started feeling nauseated again.  No vomiting.  Was awaiting discharge, at this point she continues to endorse nausea.  Discussed with patient, and reviewed her medical records and she reports that the prolonged QT diagnosis she received was about 20 years ago in [facility]  Since then she has seen cardiology I reviewed cardiology records from 2019 including a preoperative visit with Dr. Clayborn Bigness and no mention of prolonged QT is made.  At that time, patient reports that 20 years ago she felt like it was from some sort of a medication.  At this time, with her EKG reassuring here, and her ongoing nausea will trial Phenergan IM.  She is monitoring closely, on telemetry.  If nausea improves, would anticipate discharge with Zofran and scopolamine being as already prescribed.  Reassess after additional antiemetic  ----------------------------------------- 1:42 PM on 04/17/2022 ----------------------------------------- Patient resting comfortably.  Reports her nausea is much improved.  She is comfortable with plan for discharge at this time.  Awake alert in no distress.  Family coming to pick her up as she did receive a dose of Phenergan, and she is advised not to drive this afternoon.   Delman Kitten, MD 04/17/22 [phone]

## 2022-04-17 NOTE — ED Notes (Signed)
Patient given graham crackers per request. Will discharge if patient can tolerate the crackers.

## 2022-04-17 NOTE — ED Provider Notes (Signed)
[facility] Provider Note    Event Date/Time   First MD Initiated Contact with Patient 04/17/22 0730     (approximate)   History   Abdominal Pain and Emesis  HPI  Marissa [name] is a 46 y.o. female who on review of records has a history of splint application after nondisplaced fracture of the distal phalanx of the left middle finger, she was also started on ciprofloxacin and Bactrim at that appointment.  Also appears prescription of fluconazole  Primary care note reports history of diabetes, also reviewed her medical history is that history of congestive heart failure, previous MI, prolonged QTc syndrome (though interestingly also taking Zofran)  Thursday she started antibiotic for a left finger infection which is improving, currently taking Bactrim and ciprofloxacin.  Additionally she started Ozempic again which she had been off for 2 months, restarted it after getting prescribed and reports not too long later that day started feeling very nauseated frequent vomiting.   No fever.  No black or bloody emesis.  Normal bowel movements.  Just nausea and occasional vomiting  Physical Exam   Triage Vital Signs: ED Triage Vitals  Enc Vitals Group     BP 04/17/22 0656 121/81     Pulse Rate 04/17/22 0656 92     Resp --      Temp 04/17/22 0656 98.5 F (36.9 C)     Temp Source 04/17/22 0656 Oral     SpO2 04/17/22 0656 98 %     Weight 04/17/22 0652 155 lb (70.3 kg)     Height 04/17/22 0652 5' 4.5" (1.638 m)     Head Circumference --      Peak Flow --      Pain Score 04/17/22 0652 7     Pain Loc --      Pain Edu? --      Excl. in [place]? --     Most recent vital signs: Vitals:   04/17/22 0656 04/17/22 0859  BP: 121/81 120/78  Pulse: 92 88  Resp:  18  Temp: 98.5 F (36.9 C)   SpO2: 98% 98%     General: Awake, no distress.  Very pleasant.  Fully alert. CV:  Good peripheral perfusion.  Resp:  Normal effort.  Abd:  No distention.  Old midline surgical  scars clean dry and well-healing.  Reports mild to moderate tenderness especially over the midline and lower abdominal quadrants bilaterally without rebound or guarding.  No frank distention.  No obvious peritonitis.  No pain or discomfort to rebound.  No significant upper abdominal pain Other:     ED Results / Procedures / Treatments   Labs (all labs ordered are listed, but only abnormal results are displayed) Labs Reviewed  COMPREHENSIVE METABOLIC PANEL - Abnormal; Notable for the following components:      Result Value   Glucose, Bld 114 (*)    All other components within normal limits  URINALYSIS, ROUTINE W REFLEX MICROSCOPIC - Abnormal; Notable for the following components:   Color, Urine YELLOW (*)    APPearance CLEAR (*)    All other components within normal limits  LIPASE, BLOOD  CBC  MAGNESIUM  POC URINE PREG, ED     EKG  Interpreted by me at 8026 heart rate 90 QRS 80 QTc 450 Normal sinus rhythm, no evidence of acute ischemia.  QT is normal at approximately 450 (EKG performed to evaluate for prolonged QT given it is noted in her history)   RADIOLOGY  I personally interpreted the patient's CT imaging is negative for acute gross intra-abdominal pathology such as obstructive findings  CT ABDOMEN PELVIS WO CONTRAST  Result Date: 04/17/2022 CLINICAL DATA:  Abdominal pain with nausea and vomiting for 2 days. EXAM: CT ABDOMEN AND PELVIS WITHOUT CONTRAST TECHNIQUE: Multidetector CT imaging of the abdomen and pelvis was performed following the standard protocol without IV contrast. RADIATION DOSE REDUCTION: This exam was performed according to the departmental dose-optimization program which includes automated exposure control, adjustment of the mA and/or kV according to patient size and/or use of iterative reconstruction technique. COMPARISON:  03/08/2021 FINDINGS: Lower chest: Unremarkable. Hepatobiliary: No suspicious focal abnormality in the liver on this study without  intravenous contrast. Gallbladder is surgically absent. No intrahepatic or extrahepatic biliary dilation. Pancreas: No focal mass lesion. No dilatation of the main duct. No intraparenchymal cyst. No peripancreatic edema. Spleen: No splenomegaly. No focal mass lesion. Adrenals/Urinary Tract: No adrenal nodule or mass. Kidneys unremarkable. No evidence for hydroureter. The urinary bladder appears normal for the degree of distention. Stomach/Bowel: Status post sleeve gastrectomy. Duodenum is normally positioned as is the ligament of Treitz. No small bowel wall thickening. No small bowel dilatation. The terminal ileum is normal. The appendix is not well visualized, but there is no edema or inflammation in the region of the cecum. No gross colonic mass. No colonic wall thickening. Right-sided diverticulosis evident without diverticulitis. Vascular/Lymphatic: There is moderate atherosclerotic calcification of the abdominal aorta without aneurysm. There is no gastrohepatic or hepatoduodenal ligament lymphadenopathy. No retroperitoneal or mesenteric lymphadenopathy. The No pelvic sidewall lymphadenopathy. Reproductive: Unremarkable. Other: No intraperitoneal free fluid. Musculoskeletal: Lumbosacral fusion hardware evident. No worrisome lytic or sclerotic osseous abnormality. Scarring in the anterior abdominal wall noted at the region of abscess seen on the previous study. IMPRESSION: 1. No acute findings in the abdomen or pelvis. Specifically, no findings to explain the patient's history of abdominal pain with nausea and vomiting. 2. Status post sleeve gastrectomy. 3. Right-sided diverticulosis without diverticulitis. 4. Aortic Atherosclerosis (ICD10-I70.0). Electronically Signed   By: Misty Stanley M.D.   On: 04/17/2022 09:17       PROCEDURES:  Critical Care performed: No  Procedures   MEDICATIONS ORDERED IN ED: Medications  sodium chloride 0.9 % bolus 500 mL (500 mLs Intravenous New Bag/Given 04/17/22 0932)   ondansetron (ZOFRAN) injection 4 mg (4 mg Intravenous Given 04/17/22 0934)  acetaminophen (TYLENOL) tablet 1,000 mg (1,000 mg Oral Given 04/17/22 0938)  ondansetron (ZOFRAN) injection 4 mg (4 mg Intravenous Given 04/17/22 1018)     IMPRESSION / MDM / ASSESSMENT AND PLAN / ED COURSE  I reviewed the triage vital signs and the nursing notes.                              Differential diagnosis includes but is not limited to, abdominal perforation, aortic dissection, cholecystitis, appendicitis, diverticulitis, colitis, esophagitis/gastritis, kidney stone, pyelonephritis, urinary tract infection, aortic aneurysm. All are considered in decision and treatment plan. Based upon the patient's presentation and risk factors,includes, but is not limited to, possible side effect reinitiation of Ozempic, medication side effect, gastritis, viral or other infectious etiology, bowel obstruction given patient's previous complicated medical surgical history, etc.  No acute neurologic cardiac or pulmonary symptoms.  No acute vascular symptoms.  She appears nontoxic well-appearing well-hydrated on exam.  EKG demonstrates normal QT.  Feel comfortable giving additional Zofran which she takes at home as antiemetic and hydrating.  Monitor for  effect.  Patient placed on monitor.  We will also give dose of morphine which patient reports she is used in the past, currently reports pain about 6 out of 10 but is uncomfortable and understands that her brother or family will need to come pick her up she cannot drive after taking morphine  Patient's presentation is most consistent with acute complicated illness / injury requiring diagnostic workup.  The patient is on the cardiac monitor to evaluate for evidence of arrhythmia and/or significant heart rate changes.  Clinical Course as of 04/17/22 1043  Mon Apr 17, 2022  0852 CBC, lipase, comprehensive metabolic panel normal [MQ]    Clinical Course User Index [MQ] Delman Kitten, MD    UA without evidence of infection.  Patient reports no risk of pregnancy, reports previous oophorectomies without hysterectomy  ----------------------------------------- 10:41 AM on 04/17/2022 ----------------------------------------- Patient is feeling improved but still slightly nauseated.  She has been able to drink water.  Discussed with her her reassuring findings, to this point very reassuring evaluation without evidence of acute intra-abdominal etiology but I suspect may be medication related possibly related to reinitiation of Ozempic at which it is not clear if she had potentially restarted at a higher than typical initiation dose, or possibly from the use of her antibiotics.  Her left finger appears clean dry intact without erythema, she has a healing scab over the left middle phalanx and she reports it feels the infection is completely gone.  At this point she has no evidence of ongoing infection in the digit, and we will discontinue her antibiotic and she will monitor for evidence of return of infection given the risk-benefit as I suspect antibiotic may also be a potential cause for her nausea vomiting.  She will follow-up closely with Dr. Netty Starring and plans to call his office tomorrow.  Return to the ER and careful return precautions discussed.  Patient alert well oriented comfortable with plan for discharge with careful return precautions  Zofran prescribed, with patient taking this as an outpatient already and her QT today appearing normal I suspect the risk of significant QT prolongation is low, and also will prescribe scopolamine to trial as an antiemetic which does not prolong the QT.  She is comfortable with this plan and we discussed common side effects of scopolamine as well as I recommended not driving while taking this medication which she is agreeable with  Return precautions and treatment recommendations and follow-up discussed with the patient who is agreeable with the  plan.   FINAL CLINICAL IMPRESSION(S) / ED DIAGNOSES   Final diagnoses:  Nausea and vomiting, unspecified vomiting type     Rx / DC Orders   ED Discharge Orders          Ordered    scopolamine (TRANSDERM-SCOP) 1 MG/3DAYS  every 72 hours        04/17/22 1038    ondansetron (ZOFRAN-ODT) 4 MG disintegrating tablet  Every 8 hours PRN        04/17/22 1039             Note:  This document was prepared using Dragon voice recognition software and may include unintentional dictation errors.   Delman Kitten, MD 04/17/22 1043

## 2022-04-17 NOTE — ED Notes (Signed)
See triage note  Presents with some n/v   States she had low grade temp on Friday but is currently afebrile  Was recently placed on 2 antibiotics for infection in finger on Thursday

## 2022-04-17 NOTE — ED Notes (Signed)
States her nausea is returning after trying to eat crackers  Provider aware.

## 2022-04-17 NOTE — Discharge Instructions (Signed)
As your finger infection appears to have healed well and considering that your antibiotics may be a cause of your nausea and vomiting I think it is reasonable to stop your antibiotic.  If your finger begins to look infected red painful or exhibits drainage again though, I would recommend restarting your antibiotic and calling her doctor's office right away.  Please call your primary doctor tomorrow to discuss your Ozempic dosing as well, it is possible that that is also driving your symptoms today.  No driving while taking scopolamine.  Use Zofran only as prescribed.  Return to the emergency room right away if you start developing fever, persistent vomiting, dehydration, severe pain, or other new concerns or symptoms arise.

## 2022-04-17 NOTE — ED Notes (Signed)
Patient ambulated with steady gait to hallway bathroom

## 2022-04-17 NOTE — ED Triage Notes (Signed)
Pt arrived via POV with reports of N/V x 2 days and lower abd pain.  Pt is currently on 2 different antibiotics for finger infection.  Pt recently restarted on Ozempic after being off for 2 months. Pt states she started back on the dose she was on when she stopped taking it.   Pt states she has taken zofran and tried other OTC remedies for the nausea with no relief.

## 2022-05-23 ENCOUNTER — Ambulatory Visit: Payer: Medicaid Other | Admitting: Dermatology

## 2022-06-19 ENCOUNTER — Other Ambulatory Visit: Payer: Self-pay

## 2022-06-19 ENCOUNTER — Emergency Department: Payer: 59

## 2022-06-19 ENCOUNTER — Emergency Department
Admission: EM | Admit: 2022-06-19 | Discharge: 2022-06-19 | Disposition: A | Discharge status: Home / Self Care | Payer: 59 | Attending: Emergency Medicine | Admitting: Emergency Medicine

## 2022-06-19 DIAGNOSIS — R1032 Left lower quadrant pain: Secondary | ICD-10-CM

## 2022-06-19 DIAGNOSIS — I10 Essential (primary) hypertension: Secondary | ICD-10-CM | POA: Insufficient documentation

## 2022-06-19 DIAGNOSIS — E119 Type 2 diabetes mellitus without complications: Secondary | ICD-10-CM | POA: Insufficient documentation

## 2022-06-19 DIAGNOSIS — S39012A Strain of muscle, fascia and tendon of lower back, initial encounter: Secondary | ICD-10-CM | POA: Insufficient documentation

## 2022-06-19 DIAGNOSIS — X58XXXA Exposure to other specified factors, initial encounter: Secondary | ICD-10-CM | POA: Diagnosis not present

## 2022-06-19 DIAGNOSIS — S3992XA Unspecified injury of lower back, initial encounter: Secondary | ICD-10-CM | POA: Diagnosis present

## 2022-06-19 DIAGNOSIS — G8929 Other chronic pain: Secondary | ICD-10-CM

## 2022-06-19 LAB — COMPREHENSIVE METABOLIC PANEL
ALT: 15 U/L (ref 0–44)
AST: 23 U/L (ref 15–41)
Albumin: 3.5 g/dL (ref 3.5–5.0)
Alkaline Phosphatase: 87 U/L (ref 38–126)
Anion gap: 7 (ref 5–15)
BUN: 11 mg/dL (ref 6–20)
CO2: 29 mmol/L (ref 22–32)
Calcium: 9 mg/dL (ref 8.9–10.3)
Chloride: 105 mmol/L (ref 98–111)
Creatinine, Ser: 0.76 mg/dL (ref 0.44–1.00)
GFR, Estimated: >60 mL/min (ref >60)
Glucose, Bld: 87 mg/dL (ref 70–99)
Potassium: 3.3 mmol/L — ABNORMAL LOW (ref 3.5–5.1)
Sodium: 141 mmol/L (ref 135–145)
Total Bilirubin: 0.7 mg/dL (ref 0.3–1.2)
Total Protein: 6.4 g/dL — ABNORMAL LOW (ref 6.5–8.1)

## 2022-06-19 LAB — CBC WITH DIFFERENTIAL/PLATELET
Abs Immature Granulocytes: 0.01 10*3/uL (ref 0.00–0.07)
Basophils Absolute: 0 10*3/uL (ref 0.0–0.1)
Basophils Relative: 0 %
Eosinophils Absolute: 0.1 10*3/uL (ref 0.0–0.5)
Eosinophils Relative: 2 %
HCT: 36.1 % (ref 36.0–46.0)
Hemoglobin: 11.7 g/dL — ABNORMAL LOW (ref 12.0–15.0)
Immature Granulocytes: 0 %
Lymphocytes Relative: 34 %
Lymphs Abs: 1.2 10*3/uL (ref 0.7–4.0)
MCH: 29.7 pg (ref 26.0–34.0)
MCHC: 32.4 g/dL (ref 30.0–36.0)
MCV: 91.6 fL (ref 80.0–100.0)
Monocytes Absolute: 0.3 10*3/uL (ref 0.1–1.0)
Monocytes Relative: 8 %
Neutro Abs: 2 10*3/uL (ref 1.7–7.7)
Neutrophils Relative %: 56 %
Platelets: 144 10*3/uL — ABNORMAL LOW (ref 150–400)
RBC: 3.94 MIL/uL (ref 3.87–5.11)
RDW: 12.6 % (ref 11.5–15.5)
WBC: 3.6 10*3/uL — ABNORMAL LOW (ref 4.0–10.5)
nRBC: 0 % (ref 0.0–0.2)

## 2022-06-19 LAB — URINALYSIS, ROUTINE W REFLEX MICROSCOPIC
Bilirubin Urine: NEGATIVE
Glucose, UA: NEGATIVE mg/dL
Hgb urine dipstick: NEGATIVE
Ketones, ur: NEGATIVE mg/dL
Leukocytes,Ua: NEGATIVE
Nitrite: NEGATIVE
Protein, ur: NEGATIVE mg/dL
Specific Gravity, Urine: 1.009 (ref 1.005–1.030)
pH: 6 (ref 5.0–8.0)

## 2022-06-19 LAB — LIPASE, BLOOD: Lipase: 29 U/L (ref 11–51)

## 2022-06-19 MED ORDER — ONDANSETRON HCL 4 MG/2ML IJ SOLN
4.0000 mg | Freq: Once | INTRAMUSCULAR | Status: AC
  Administered 2022-06-19: 4 mg via INTRAVENOUS
  Filled 2022-06-19: qty 2

## 2022-06-19 MED ORDER — HYDROMORPHONE HCL 1 MG/ML IJ SOLN
1.0000 mg | Freq: Once | INTRAMUSCULAR | Status: AC
  Administered 2022-06-19: 1 mg via INTRAVENOUS
  Filled 2022-06-19: qty 1

## 2022-06-19 MED ORDER — IOHEXOL 300 MG/ML  SOLN
100.0000 mL | Freq: Once | INTRAMUSCULAR | Status: AC | PRN
Start: 2022-06-19 — End: 2022-06-19
  Administered 2022-06-19: 100 mL via INTRAVENOUS

## 2022-06-19 MED ORDER — KETOROLAC TROMETHAMINE 30 MG/ML IJ SOLN
30.0000 mg | Freq: Once | INTRAMUSCULAR | Status: AC
  Administered 2022-06-19: 30 mg via INTRAMUSCULAR
  Filled 2022-06-19: qty 1

## 2022-06-19 MED ORDER — CYCLOBENZAPRINE HCL 10 MG PO TABS
10.0000 mg | ORAL_TABLET | Freq: Once | ORAL | Status: AC
  Administered 2022-06-19: 10 mg via ORAL
  Filled 2022-06-19: qty 1

## 2022-06-19 MED ORDER — MORPHINE SULFATE (PF) 4 MG/ML IV SOLN
4.0000 mg | Freq: Once | INTRAVENOUS | Status: AC
  Administered 2022-06-19: 4 mg via INTRAVENOUS
  Filled 2022-06-19: qty 1

## 2022-06-19 MED ORDER — CYCLOBENZAPRINE HCL 10 MG PO TABS
10.0000 mg | ORAL_TABLET | Freq: Three times a day (TID) | ORAL | 0 refills | Status: DC | PRN
Start: 2022-06-19 — End: 2023-05-03

## 2022-06-19 MED ORDER — OXYCODONE-ACETAMINOPHEN 5-325 MG PO TABS
1.0000 | ORAL_TABLET | Freq: Once | ORAL | Status: AC
  Administered 2022-06-19: 1 via ORAL
  Filled 2022-06-19: qty 1

## 2022-06-19 NOTE — ED Triage Notes (Signed)
Pt to Ed via POV from home. Pt reports left lower back pain that radiates to her groin. Pt states might have injured it moving yesterday. Pt denies urinary symptoms.

## 2022-06-19 NOTE — Discharge Instructions (Signed)
Follow-up with your regular doctor if not improving in 3 days.  Return if worsening. We did call in a muscle relaxer for you.  I am unable to call in a narcotic because you are on a pain contract with your physician and just recently received 120 oxycodone. Take medication as prescribed.

## 2022-06-19 NOTE — ED Provider Notes (Signed)
[facility] Provider Note    Event Date/Time   First MD Initiated Contact with Patient 06/19/22 [phone]     (approximate)   History   Back Pain   HPI  Marissa [name] is a 46 y.o. female with history of hypertension, gastric bypass, diabetes, hyperlipidemia, chronic back pain presents emergency department complaining of low back pain that radiates to the left groin.  Patient states it hurts more with movement.  No dysuria.  Does not take NSAIDs secondary to gastric bypass but can take injections.  She denies any fever or chills.  Possible injury yesterday she did do some lifting      Physical Exam   Triage Vital Signs: ED Triage Vitals  Enc Vitals Group     BP 06/19/22 0708 (!) 153/101     Pulse Rate 06/19/22 0708 96     Resp 06/19/22 0708 18     Temp 06/19/22 0708 98.3 F (36.8 C)     Temp Source 06/19/22 0708 Oral     SpO2 06/19/22 0708 100 %     Weight 06/19/22 0717 154 lb 15.7 oz (70.3 kg)     Height 06/19/22 0717 5' 4.5" (1.638 m)     Head Circumference --      Peak Flow --      Pain Score 06/19/22 0706 8     Pain Loc --      Pain Edu? --      Excl. in [place]? --     Most recent vital signs: Vitals:   06/19/22 0708 06/19/22 1020  BP: (!) 153/101 (!) 145/90  Pulse: 96 94  Resp: 18 16  Temp: 98.3 F (36.8 C) 97.9 F (36.6 C)  SpO2: 100% 100%     General: Awake, no distress.   CV:  Good peripheral perfusion. regular rate and  rhythm Resp:  Normal effort.  Abd:  No distention.   Other:  Patient has difficulty with movement, she has some difficulty leaning forward and rising from sitting to standing, 5/5 strength lower extremities   ED Results / Procedures / Treatments   Labs (all labs ordered are listed, but only abnormal results are displayed) Labs Reviewed  URINALYSIS, ROUTINE W REFLEX MICROSCOPIC - Abnormal; Notable for the following components:      Result Value   Color, Urine YELLOW (*)    APPearance CLEAR (*)    All  other components within normal limits  CBC WITH DIFFERENTIAL/PLATELET - Abnormal; Notable for the following components:   WBC 3.6 (*)    Hemoglobin 11.7 (*)    Platelets 144 (*)    All other components within normal limits  COMPREHENSIVE METABOLIC PANEL - Abnormal; Notable for the following components:   Potassium 3.3 (*)    Total Protein 6.4 (*)    All other components within normal limits  LIPASE, BLOOD     EKG     RADIOLOGY     PROCEDURES:   Procedures   MEDICATIONS ORDERED IN ED: Medications  ketorolac (TORADOL) 30 MG/ML injection 30 mg (30 mg Intramuscular Given 06/19/22 0742)  cyclobenzaprine (FLEXERIL) tablet 10 mg (10 mg Oral Given 06/19/22 0738)  ondansetron (ZOFRAN) injection 4 mg (4 mg Intravenous Given 06/19/22 0921)  morphine (PF) 4 MG/ML injection 4 mg (4 mg Intravenous Given 06/19/22 0921)  HYDROmorphone (DILAUDID) injection 1 mg (1 mg Intravenous Given 06/19/22 1004)  iohexol (OMNIPAQUE) 300 MG/ML solution 100 mL (100 mLs Intravenous Contrast Given 06/19/22 1044)  oxyCODONE-acetaminophen (PERCOCET/ROXICET)  5-325 MG per tablet 1 tablet (1 tablet Oral Given 06/19/22 1125)     IMPRESSION / MDM / ASSESSMENT AND PLAN / ED COURSE  I reviewed the triage vital signs and the nursing notes.                              Differential diagnosis includes, but is not limited to, lumbar strain, kidney stone, hernia, muscle spasm  Patient's presentation is most consistent with acute complicated illness / injury requiring diagnostic workup.   Since the patient is having difficulty with movement I think kidney stone is less likely however we will still check urinalysis to confirm as she is tender along the posterior left flank and has some CVA tenderness.   Patient was given Toradol 30 mg IM, Flexeril 10 mg p.o.  After pain medication patient is still having a significant amount of pain.  Had nursing staff undress her and tried to evaluate exactly where she is tender at  the anterior left lower quadrant.  Patient is tender lower which may indicate a hernia beneath her scar from tummy tuck.  Due to the significant amount of pain in the left lower quadrant, we will do basic labs along with CT abdomen pelvis to assess for hernia.  Morphine and Zofran IV  Patient's labs are reassuring, CBC metabolic panel lipase and urinalysis are normal  Patient is continuing to complain of pain, she has been given Toradol and morphine so we will increase to Dilaudid  CT abdomen/pelvis independently reviewed and interpreted by me as being negative for any acute abnormality.  I did discuss these findings with patient.  Due to multiple surgeries and her recent move and feel that she has pulled a muscle in this area and has aggravated surgical lines.  She is to use the Flexeril as needed.  Has not give her narcotic as she is on a pain contract and recently received 120 oxycodone 30 mg.  She is in agreement treatment plan.  She is discharged stable condition.  FINAL CLINICAL IMPRESSION(S) / ED DIAGNOSES   Final diagnoses:  Lumbar strain, initial encounter  Acute exacerbation of chronic low back pain  LLQ pain     Rx / DC Orders   ED Discharge Orders          Ordered    cyclobenzaprine (FLEXERIL) 10 MG tablet  3 times daily PRN        06/19/22 1111             Note:  This document was prepared using Dragon voice recognition software and may include unintentional dictation errors.    Versie Starks, PA-C 06/19/22 1247    Lavonia Drafts, MD 06/19/22 1249

## 2022-06-23 ENCOUNTER — Other Ambulatory Visit: Payer: Self-pay | Admitting: Student

## 2022-06-23 DIAGNOSIS — M4712 Other spondylosis with myelopathy, cervical region: Secondary | ICD-10-CM

## 2022-07-05 ENCOUNTER — Ambulatory Visit
Admission: RE | Admit: 2022-07-05 | Discharge: 2022-07-05 | Disposition: A | Discharge status: Home / Self Care | Payer: 59 | Source: Ambulatory Visit | Attending: Student | Admitting: Student

## 2022-07-05 ENCOUNTER — Encounter: Payer: Self-pay | Admitting: Nurse Practitioner

## 2022-07-05 ENCOUNTER — Ambulatory Visit
Admission: RE | Admit: 2022-07-05 | Discharge: 2022-07-05 | Disposition: A | Discharge status: Home / Self Care | Payer: Medicaid Other | Source: Ambulatory Visit | Attending: Student | Admitting: Student

## 2022-07-05 DIAGNOSIS — M4712 Other spondylosis with myelopathy, cervical region: Secondary | ICD-10-CM

## 2022-07-05 MED ORDER — ONDANSETRON HCL 4 MG/2ML IJ SOLN
4.0000 mg | Freq: Once | INTRAMUSCULAR | Status: AC | PRN
  Administered 2022-07-05: 4 mg via INTRAMUSCULAR

## 2022-07-05 MED ORDER — MEPERIDINE HCL 50 MG/ML IJ SOLN
50.0000 mg | Freq: Once | INTRAMUSCULAR | Status: AC | PRN
  Administered 2022-07-05: 50 mg via INTRAMUSCULAR

## 2022-07-05 MED ORDER — DIAZEPAM 5 MG PO TABS
10.0000 mg | ORAL_TABLET | Freq: Once | ORAL | Status: AC
  Administered 2022-07-05: 10 mg via ORAL

## 2022-07-05 MED ORDER — IOPAMIDOL (ISOVUE-M 300) INJECTION 61%
10.0000 mL | Freq: Once | INTRAMUSCULAR | Status: AC
  Administered 2022-07-05: 10 mL via INTRATHECAL

## 2022-07-05 NOTE — Discharge Instructions (Signed)

## 2022-07-05 NOTE — Discharge Instr - Other Info (Addendum)
0952: pt reports pain 9/10 from myelogram procedure. See MAR 1015: pt reports pain is better. Ranking pain 5/10. Repositioned pt.

## 2022-07-11 ENCOUNTER — Inpatient Hospital Stay: Payer: 59

## 2022-07-12 ENCOUNTER — Inpatient Hospital Stay: Payer: 59 | Admitting: Oncology

## 2022-07-12 ENCOUNTER — Inpatient Hospital Stay: Payer: 59

## 2022-08-15 ENCOUNTER — Inpatient Hospital Stay: Payer: 59 | Attending: Oncology

## 2022-08-15 DIAGNOSIS — E538 Deficiency of other specified B group vitamins: Secondary | ICD-10-CM | POA: Insufficient documentation

## 2022-08-15 DIAGNOSIS — Z880 Allergy status to penicillin: Secondary | ICD-10-CM | POA: Insufficient documentation

## 2022-08-15 DIAGNOSIS — Z8349 Family history of other endocrine, nutritional and metabolic diseases: Secondary | ICD-10-CM | POA: Insufficient documentation

## 2022-08-15 DIAGNOSIS — Z818 Family history of other mental and behavioral disorders: Secondary | ICD-10-CM | POA: Diagnosis not present

## 2022-08-15 DIAGNOSIS — Z803 Family history of malignant neoplasm of breast: Secondary | ICD-10-CM | POA: Insufficient documentation

## 2022-08-15 DIAGNOSIS — Z833 Family history of diabetes mellitus: Secondary | ICD-10-CM | POA: Insufficient documentation

## 2022-08-15 DIAGNOSIS — Z9049 Acquired absence of other specified parts of digestive tract: Secondary | ICD-10-CM | POA: Insufficient documentation

## 2022-08-15 DIAGNOSIS — D508 Other iron deficiency anemias: Secondary | ICD-10-CM | POA: Diagnosis present

## 2022-08-15 DIAGNOSIS — Z801 Family history of malignant neoplasm of trachea, bronchus and lung: Secondary | ICD-10-CM | POA: Insufficient documentation

## 2022-08-15 DIAGNOSIS — Z9884 Bariatric surgery status: Secondary | ICD-10-CM | POA: Diagnosis not present

## 2022-08-15 DIAGNOSIS — M549 Dorsalgia, unspecified: Secondary | ICD-10-CM | POA: Insufficient documentation

## 2022-08-15 DIAGNOSIS — Z79899 Other long term (current) drug therapy: Secondary | ICD-10-CM | POA: Insufficient documentation

## 2022-08-15 DIAGNOSIS — Z8249 Family history of ischemic heart disease and other diseases of the circulatory system: Secondary | ICD-10-CM | POA: Diagnosis not present

## 2022-08-15 DIAGNOSIS — Z886 Allergy status to analgesic agent status: Secondary | ICD-10-CM | POA: Insufficient documentation

## 2022-08-15 DIAGNOSIS — R42 Dizziness and giddiness: Secondary | ICD-10-CM | POA: Diagnosis not present

## 2022-08-15 DIAGNOSIS — I509 Heart failure, unspecified: Secondary | ICD-10-CM | POA: Insufficient documentation

## 2022-08-15 DIAGNOSIS — I252 Old myocardial infarction: Secondary | ICD-10-CM | POA: Diagnosis not present

## 2022-08-15 DIAGNOSIS — M542 Cervicalgia: Secondary | ICD-10-CM | POA: Insufficient documentation

## 2022-08-15 DIAGNOSIS — I110 Hypertensive heart disease with heart failure: Secondary | ICD-10-CM | POA: Insufficient documentation

## 2022-08-15 DIAGNOSIS — R5383 Other fatigue: Secondary | ICD-10-CM | POA: Insufficient documentation

## 2022-08-15 DIAGNOSIS — Z814 Family history of other substance abuse and dependence: Secondary | ICD-10-CM | POA: Diagnosis not present

## 2022-08-15 DIAGNOSIS — G8929 Other chronic pain: Secondary | ICD-10-CM | POA: Insufficient documentation

## 2022-08-15 DIAGNOSIS — Z881 Allergy status to other antibiotic agents status: Secondary | ICD-10-CM | POA: Diagnosis not present

## 2022-08-15 DIAGNOSIS — D500 Iron deficiency anemia secondary to blood loss (chronic): Secondary | ICD-10-CM

## 2022-08-15 LAB — CBC WITH DIFFERENTIAL/PLATELET
Abs Immature Granulocytes: 0.02 10*3/uL (ref 0.00–0.07)
Basophils Absolute: 0 10*3/uL (ref 0.0–0.1)
Basophils Relative: 0 %
Eosinophils Absolute: 0.1 10*3/uL (ref 0.0–0.5)
Eosinophils Relative: 2 %
HCT: 38.6 % (ref 36.0–46.0)
Hemoglobin: 12.6 g/dL (ref 12.0–15.0)
Immature Granulocytes: 1 %
Lymphocytes Relative: 26 %
Lymphs Abs: 1.1 10*3/uL (ref 0.7–4.0)
MCH: 29.5 pg (ref 26.0–34.0)
MCHC: 32.6 g/dL (ref 30.0–36.0)
MCV: 90.4 fL (ref 80.0–100.0)
Monocytes Absolute: 0.4 10*3/uL (ref 0.1–1.0)
Monocytes Relative: 9 %
Neutro Abs: 2.6 10*3/uL (ref 1.7–7.7)
Neutrophils Relative %: 62 %
Platelets: 158 10*3/uL (ref 150–400)
RBC: 4.27 MIL/uL (ref 3.87–5.11)
RDW: 13.3 % (ref 11.5–15.5)
WBC: 4.1 10*3/uL (ref 4.0–10.5)
nRBC: 0 % (ref 0.0–0.2)

## 2022-08-15 LAB — FERRITIN: Ferritin: 117 ng/mL (ref 11–307)

## 2022-08-15 LAB — VITAMIN B12: Vitamin B-12: 168 pg/mL — ABNORMAL LOW (ref 180–914)

## 2022-08-15 LAB — IRON AND TIBC
Iron: 108 ug/dL (ref 28–170)
Saturation Ratios: 31 % (ref 10.4–31.8)
TIBC: 353 ug/dL (ref 250–450)
UIBC: 245 ug/dL

## 2022-08-15 LAB — FOLATE: Folate: 16.7 ng/mL (ref >5.9)

## 2022-08-15 MED FILL — Iron Sucrose Inj 20 MG/ML (Fe Equiv): INTRAVENOUS | Qty: 10 | Status: AC

## 2022-08-16 ENCOUNTER — Inpatient Hospital Stay (HOSPITAL_BASED_OUTPATIENT_CLINIC_OR_DEPARTMENT_OTHER): Payer: 59 | Admitting: Oncology

## 2022-08-16 ENCOUNTER — Encounter: Payer: Self-pay | Admitting: Oncology

## 2022-08-16 ENCOUNTER — Inpatient Hospital Stay: Payer: 59

## 2022-08-16 VITALS — BP 172/102 | HR 95 | Temp 97.7°F | Resp 18 | Wt 158.0 lb

## 2022-08-16 DIAGNOSIS — D508 Other iron deficiency anemias: Secondary | ICD-10-CM | POA: Diagnosis not present

## 2022-08-16 DIAGNOSIS — D509 Iron deficiency anemia, unspecified: Secondary | ICD-10-CM

## 2022-08-16 DIAGNOSIS — E538 Deficiency of other specified B group vitamins: Secondary | ICD-10-CM

## 2022-08-16 MED ORDER — CYANOCOBALAMIN 1000 MCG/ML IJ SOLN
1000.0000 ug | Freq: Once | INTRAMUSCULAR | Status: AC
  Administered 2022-08-16: 1000 ug via INTRAMUSCULAR
  Filled 2022-08-16: qty 1

## 2022-08-16 NOTE — Assessment & Plan Note (Signed)
Recommend IM vitamin B12 1000 mcg injection daily x 5 followed by monthly.

## 2022-08-16 NOTE — Assessment & Plan Note (Signed)
History of gastric sleeve. Lab results were reviewed and discussed with patient. Patient has normal hemoglobin and normal iron panel.  I will hold off IV Venofer treatments for now.

## 2022-08-16 NOTE — Progress Notes (Signed)
Hematology/Oncology follow up  note [facility] Telephone:([phone]) [phone] [phone]   Patient Care Team: [name], MD as PCP - General (Family Medicine) Theodis Sato, Gertha Calkin, MD as Consulting Physician (Cardiology) Kary Kos, MD as Consulting Physician (Neurosurgery) Earlie Server, MD as Consulting Physician (Hematology and Oncology)  CHIEF COMPLAINTS/REASON FOR VISIT:  Follow up of iron deficiency anemia  ASSESSMENT & PLAN:   Vitamin B12 deficiency Recommend IM vitamin B12 1000 mcg injection daily x 5 followed by monthly.  IDA (iron deficiency anemia) History of gastric sleeve. Lab results were reviewed and discussed with patient. Patient has normal hemoglobin and normal iron panel.  I will hold off IV Venofer treatments for now.  Orders Placed This Encounter  Procedures   CBC with Differential/Platelet    Standing Status:   Future    Standing Expiration Date:   08/17/2023   Comprehensive metabolic panel    Standing Status:   Future    Standing Expiration Date:   08/16/2023   Iron and TIBC    Standing Status:   Future    Standing Expiration Date:   08/17/2023   Ferritin    Standing Status:   Future    Standing Expiration Date:   08/17/2023   Vitamin B12    Standing Status:   Future    Standing Expiration Date:   08/17/2023   Folate    Standing Status:   Future    Standing Expiration Date:   08/17/2023   Follow-up in 6 months. All questions were answered. The patient knows to call the clinic with any problems, questions or concerns.  Earlie Server, MD, PhD [facility] Hematology Oncology 08/16/2022   HISTORY OF PRESENTING ILLNESS:  Marissa [name] is a  47 y.o.  female with PMH listed below was seen in consultation at the request of [name], MD  for evaluation of iron deficiency anemia.   Reviewed patient's recent labs  09/23/2019 labs revealed anemia with hemoglobin of 9.4, MCV 78.7.  Patient was referred to [facility] emergency room on  09/24/2019 for evaluation and IV iron infusion.  Patient reports approximately 4 weeks.  Of generalized fatigue, dizziness and feeling cold. Reviewed patient's previous labs ordered by primary care physician's office, anemia is chronic onset, since at least 2014. I will have any recent iron panel.  02/01/2024 iron saturation 10, ferritin 106. Patient received IV Feraheme and he tolerates well. 09/27/2019, patient went to [facility] ER for evaluation of epigastric pain.  She had CT abdomen pelvis done which showed no acute pathology within the abdomen or pelvis. Associated signs and symptoms: Patient reports fatigue, dizziness and feeling cold.  Denies SOB with exertion.  Denies weight loss, easy bruising, hematochezia, hemoptysis, hematuria. Context:  History of iron deficiency:  Rectal bleeding: Denies Menstrual bleeding/ Vaginal bleeding : LMP 12/21/2014, history of salpingo-oophorectomy bilateral. Hematemesis or hemoptysis : denies Blood in urine : denies  Last endoscopy: Patient had a history of laparoscopic gastric sleeve  April 2021 at [facility] due to abdominal pain, AKI. and had enteroscopy done on 12/11/2019. No specimen was collected. The area of ulceration and stenosis could not be reached with this antegrade approach. 12/15/2019 enteroscopy examined portion of the ileum was normal, diverticulosis in the left colon.  01/07/2020 he was seen by Dr.Bruce-Bariatric Surgery.  Per patient there was plan of laparoscopic adhesion lysis procedure.   01/14/2021, patient had abdominal wall lipoma excision.  Patient experienced chronic ongoing bleeding from the surgical incision.  She  had work-up  there including a CT scan which showed large fluid collection consistent with seroma versus hematoma.  Patient's hemoglobin also dropped to 8.9.  Patient reports feeling extremely weak and fatigued and was seen by symptom management clinic Beckey Rutter.  INTERVAL HISTORY Marissa [name] is a 47 y.o. female who has above  history reviewed by me today presents for follow up visit for management of iron deficiency anemia Patient was given IV Venofer treatments.  Today she presents for follow-up. Patient has neck pain.  BP is high in clinic and patient attributes to pain. She feels more fatigue and tiredness..  Previously patient gets vitamin B12 injections at another provider's office.  Recently she has been off B12 injections.    Review of Systems  Constitutional:  Positive for fatigue. Negative for appetite change, chills and fever.  HENT:   Negative for hearing loss and voice change.   Eyes:  Negative for eye problems.  Respiratory:  Negative for chest tightness, cough and shortness of breath.   Cardiovascular:  Negative for chest pain.  Gastrointestinal:  Negative for abdominal distention, abdominal pain and blood in stool.  Endocrine: Negative for hot flashes.  Genitourinary:  Negative for difficulty urinating and frequency.   Musculoskeletal:  Positive for neck pain. Negative for arthralgias.  Skin:  Negative for itching and rash.  Neurological:  Negative for extremity weakness.  Hematological:  Negative for adenopathy.  Psychiatric/Behavioral:  Negative for confusion.     MEDICAL HISTORY:  Past Medical History:  Diagnosis Date   Allergy    Anemia    Anxiety    Arthritis    Back pain, chronic    Cardiomyopathy, dilated, nonischemic (HCC)    CHF (congestive heart failure) (HCC)    Cholelithiasis    Coronary artery disease    Depression    Diabetes mellitus without complication (HCC)    fasting cbg 50-140s   Dysrhythmia    tachycardia   GERD (gastroesophageal reflux disease)    otc meds   Headache(784.0)    Hypercholesteremia    Hyperlipemia    Hypertension    IDA (iron deficiency anemia) 09/29/2019   Insulin pump in place    pt had insulin pump but it is now removed (02-18-16)   MI, old    2006   Migraines    once/month maybe - can last up to two weeks   Myocardial infarction [facility])  2006   "due to medication"; no evidence of ischemia or infarction by nuclear stress test '11   Neck pain, chronic    Neuropathy    legs   Pneumonia 2015   [facility]   Prolonged QT interval syndrome    Renal insufficiency    Restless leg syndrome, controlled    Sepsis ([place]) 2016   secondary to surgery   Sleep apnea    does not use cpap since losing alot of weight   Vertigo    nop episodes 2020   Vision loss    due to diabetes   Wears contact lenses    Wears dentures    partial lower    SURGICAL HISTORY: Past Surgical History:  Procedure Laterality Date   ACHILLES TENDON SURGERY Right 04/22/2019   Procedure: [facility] PROCEDURE WITH SUTURE ANCHOR;  Surgeon: Caroline More, DPM;  Location: [place];  Service: Podiatry;  Laterality: Right;   APPENDECTOMY N/A 03/08/2018   Procedure: APPENDECTOMY;  Surgeon: Benjaman Kindler, MD;  Location: [facility] ORS;  Service: Gynecology;  Laterality: N/A;  By Dr. Windell Moment  BACK SURGERY  2011   Lumbar    BOWEL RESECTION N/A 03/08/2018   Procedure: SMALL BOWEL RESECTION;  Surgeon: Benjaman Kindler, MD;  Location: [facility] ORS;  Service: Gynecology;  Laterality: N/A;   CARDIAC CATHETERIZATION  07/18/2004   50-60% mid LAD, minor luminal irregularities RCA, normal LM and CX, EF 50-55% [facility])   carpel tunnel Bilateral    CERVICAL FUSION  2006   CHOLECYSTECTOMY N/A 04/08/2015   Procedure: LAPAROSCOPIC CHOLECYSTECTOMY WITH INTRAOPERATIVE CHOLANGIOGRAM;  Surgeon: Florene Glen, MD;  Location: [facility] ORS;  Service: General;  Laterality: N/A;   COLON RESECTION N/A 03/16/2018   Procedure: COLON RESECTION;  Surgeon: Florene Glen, MD;  Location: [facility] OR[facility];  Service: General;  Laterality: N/A;   COLONOSCOPY WITH PROPOFOL N/A 10/22/2019   Procedure: COLONOSCOPY WITH PROPOFOL;  Surgeon: Toledo, Benay Pike, MD;  Location: [facility]COPY;  Service: Gastroenterology;  Laterality: N/A;  [place] DID RAPID TEST; COPY ON CHART   ESOPHAGOGASTRODUODENOSCOPY  (EGD) WITH PROPOFOL N/A 10/22/2019   Procedure: ESOPHAGOGASTRODUODENOSCOPY (EGD) WITH PROPOFOL;  Surgeon: Toledo, Benay Pike, MD;  Location: [facility] END[facility];  Service: Gastroenterology;  Laterality: N/A;   HAMMER TOE SURGERY Right 04/22/2019   Procedure: HAMMER TOE CORRECTION X 4;  Surgeon: Caroline More, DPM;  Location: [place];  Service: Podiatry;  Laterality: Right;  Diabetic - insulin and oral meds   LAPAROSCOPIC GASTRECTOMY  05/31/2017   [facility], Dr. Darnell Level   LUMBAR LAMINECTOMY/DECOMPRESSION MICRODISCECTOMY Right 05/07/2013   Procedure: Right Lumbar one-two laminectomy;  Surgeon: Elaina Hoops, MD;  Location: [place] NEURO ORS;  Service: Neurosurgery;  Laterality: Right;   LUMBAR LAMINECTOMY/DECOMPRESSION MICRODISCECTOMY Left 10/29/2013   Procedure: Left Lumbar five-Sacral one Laminectomy;  Surgeon: Elaina Hoops, MD;  Location: [place] NEURO ORS;  Service: Neurosurgery;  Laterality: Left;   LUMBAR WOUND DEBRIDEMENT N/A 01/25/2014   Procedure: LUMBAR WOUND DEBRIDEMENT;  Surgeon: Charlie Pitter, MD;  Location: [place] NEURO ORS;  Service: Neurosurgery;  Laterality: N/A;   LUMBAR WOUND DEBRIDEMENT N/A 02/25/2014   Procedure: LUMBAR WOUND DEBRIDEMENT;  Surgeon: Elaina Hoops, MD;  Location: [place] NEURO ORS;  Service: Neurosurgery;  Laterality: N/A;   LYSIS OF ADHESION N/A 03/08/2018   Procedure: LYSIS OF ADHESION;  Surgeon: Benjaman Kindler, MD;  Location: [facility];  Service: Gynecology;  Laterality: N/A;   SALPINGOOPHORECTOMY Bilateral    1 1997. 2nd 2001   SHOULDER ARTHROSCOPY WITH BICEPSTENOTOMY Left 03/24/2016   Procedure: shoulder arthroscopy with biceps TENOTOMY, removal loose body, limited synovectomy;  Surgeon: Leanor Kail, MD;  Location: [facility] OR[facility];  Service: Orthopedics;  Laterality: Left;   shoulder sugery     7/17 rotator cuff   SMALL BOWEL REPAIR N/A 03/08/2018   Procedure: SMALL BOWEL REPAIR;  Surgeon: Benjaman Kindler, MD;  Location: [facility] OR[facility];  Service: Gynecology;  Laterality: N/A;    SOCIAL  HISTORY: Social History   Socioeconomic History   Marital status: Divorced    Spouse name: Not on file   Number of children: 0   Years of education: College   Highest education level: Not on file  Occupational History   Occupation: Glass blower/designer  Tobacco Use   Smoking status: Never   Smokeless tobacco: Never  Vaping Use   Vaping Use: Never used  Substance and Sexual Activity   Alcohol use: No   Drug use: No   Sexual activity: Yes    Birth control/protection: Surgical  Other Topics Concern   Not on file  Social History Narrative   Lives  at home with her fiance.   Right-handed.   Occasional caffeine use.   Social Determinants of Health   Financial Resource Strain: Not on file  Food Insecurity: Not on file  Transportation Needs: Not on file  Physical Activity: Not on file  Stress: Not on file  Social Connections: Not on file  Intimate Partner Violence: Not on file    FAMILY HISTORY: Family History  Problem Relation Age of Onset   Depression Mother    Drug abuse Mother    Early death Mother    Hypertension Mother    Varicose Veins Mother    CVA Mother    Diabetes Father    Early death Father    Hyperlipidemia Father    Heart Problems Father        enlarged geart   Breast cancer Maternal Grandmother        60's   Breast cancer Maternal Aunt    Lung cancer Maternal Grandfather     ALLERGIES:  is allergic to other, bactrim [sulfamethoxazole-trimethoprim], ciprofloxacin, vancomycin, erythromycin, nsaids, sulfamethoxazole-trimethoprim, amoxicillin, azithromycin, cefotaxime, ceftriaxone, and food.  MEDICATIONS:  Current Outpatient Medications  Medication Sig Dispense Refill   acetaminophen (TYLENOL) 500 MG tablet Take 1,000 mg by mouth 2 (two) times daily as needed for moderate pain or headache.     AJOVY 225 MG/1.5ML SOAJ Inject into the skin as directed.     BIOTIN PO Take 1 tablet by mouth daily.     cyclobenzaprine (FLEXERIL) 10 MG tablet Take 1 tablet  (10 mg total) by mouth 3 (three) times daily as needed. 30 tablet 0   diphenhydrAMINE (BENADRYL) 25 mg capsule Take 25 mg by mouth every 6 (six) hours as needed.     Multiple Vitamin (MULTIVITAMIN) tablet Take 1 tablet by mouth daily.     OZEMPIC, 1 MG/DOSE, 4 MG/3ML SOPN Inject 1 mg into the skin once a week.     UBRELVY 100 MG TABS as directed.     CLIMARA PRO 0.045-0.015 MG/DAY Place 1 patch onto the skin every 7 (seven) days. (Patient not taking: Reported on 08/16/2022)     fluconazole (DIFLUCAN) 150 MG tablet Take 1 tablet PO, then repeat 3 days later if continued yeast symptoms (Patient not taking: Reported on 08/16/2022) 2 tablet 0   moxifloxacin (AVELOX) 400 MG tablet Take 400 mg by mouth daily at 8 pm. (Patient not taking: Reported on 08/16/2022)     NARCAN 4 MG/0.1ML LIQD nasal spray kit Place 1 spray into the nose once. If no response after 2 minutes repeat in opposite nostril. (Patient not taking: Reported on 12/08/2020)  0   nitroGLYCERIN (NITROSTAT) 0.4 MG SL tablet Place 0.4 mg under the tongue every 5 (five) minutes as needed for chest pain. (Patient not taking: Reported on 08/16/2022)     NON FORMULARY Floradix liquid 1 capful QD (Patient not taking: Reported on 08/16/2022)     ondansetron (ZOFRAN-ODT) 4 MG disintegrating tablet Take 1 tablet (4 mg total) by mouth every 8 (eight) hours as needed for nausea or vomiting. (Patient not taking: Reported on 08/16/2022) 14 tablet 0   oxycodone (ROXICODONE) 30 MG immediate release tablet Take 15 mg by mouth every 4 (four) hours as needed for pain. (Patient not taking: Reported on 08/16/2022)     OZEMPIC, 0.25 OR 0.5 MG/DOSE, 2 MG/1.5ML SOPN INJECT 0.375 MLS (0.5 MG TOTAL) SUBCUTANEOUSLY ONCE A WEEK FOR 30 DAYS (Patient not taking: Reported on 08/16/2022)     scopolamine (TRANSDERM-SCOP) 1 MG/3DAYS  Place 1 patch (1.5 mg total) onto the skin every 3 (three) days. (Patient not taking: Reported on 08/16/2022) 3 patch 0   No current facility-administered  medications for this visit.     PHYSICAL EXAMINATION: ECOG PERFORMANCE STATUS: 1 - Symptomatic but completely ambulatory Vitals:   08/16/22 1448  BP: (!) 172/102  Pulse: 95  Resp: 18  Temp: 97.7 F (36.5 C)   Filed Weights   08/16/22 1448  Weight: 158 lb (71.7 kg)    Physical Exam Constitutional:      General: She is not in acute distress. HENT:     Head: Normocephalic and atraumatic.  Eyes:     General: No scleral icterus. Cardiovascular:     Rate and Rhythm: Normal rate.  Pulmonary:     Effort: Pulmonary effort is normal. No respiratory distress.     Breath sounds: No wheezing.  Abdominal:     Comments: Status post excision of lipoma.  Musculoskeletal:        General: No deformity. Normal range of motion.     Cervical back: Normal range of motion and neck supple.  Skin:    General: Skin is warm and dry.     Findings: No erythema or rash.  Neurological:     Mental Status: She is alert and oriented to person, place, and time. Mental status is at baseline.     Cranial Nerves: No cranial nerve deficit.     Coordination: Coordination normal.  Psychiatric:        Mood and Affect: Mood normal.     Labs     Latest Ref Rng & Units 06/19/2022    9:07 AM  CMP  Glucose 70 - 99 mg/dL 87   BUN 6 - 20 mg/dL 11   Creatinine 0.44 - 1.00 mg/dL 0.76   Sodium 135 - 145 mmol/L 141   Potassium 3.5 - 5.1 mmol/L 3.3   Chloride 98 - 111 mmol/L 105   CO2 22 - 32 mmol/L 29   Calcium 8.9 - 10.3 mg/dL 9.0   Total Protein 6.5 - 8.1 g/dL 6.4   Total Bilirubin 0.3 - 1.2 mg/dL 0.7   Alkaline Phos 38 - 126 U/L 87   AST 15 - 41 U/L 23   ALT 0 - 44 U/L 15       Latest Ref Rng & Units 08/15/2022    8:09 AM  CBC  WBC 4.0 - 10.5 K/uL 4.1   Hemoglobin 12.0 - 15.0 g/dL 12.6   Hematocrit 36.0 - 46.0 % 38.6   Platelets 150 - 400 K/uL 158      Iron/TIBC/Ferritin/ %Sat    Component Value Date/Time   IRON 108 08/15/2022 0809   TIBC 353 08/15/2022 0809   FERRITIN 117 08/15/2022  0809   IRONPCTSAT 31 08/15/2022 0809

## 2022-08-17 ENCOUNTER — Inpatient Hospital Stay: Payer: 59

## 2022-08-18 ENCOUNTER — Inpatient Hospital Stay: Payer: 59

## 2022-08-18 DIAGNOSIS — D508 Other iron deficiency anemias: Secondary | ICD-10-CM | POA: Diagnosis not present

## 2022-08-18 DIAGNOSIS — D509 Iron deficiency anemia, unspecified: Secondary | ICD-10-CM

## 2022-08-18 MED ORDER — CYANOCOBALAMIN 1000 MCG/ML IJ SOLN
1000.0000 ug | Freq: Once | INTRAMUSCULAR | Status: AC
  Administered 2022-08-18: 1000 ug via INTRAMUSCULAR

## 2022-08-21 ENCOUNTER — Inpatient Hospital Stay: Payer: 59

## 2022-08-21 DIAGNOSIS — D509 Iron deficiency anemia, unspecified: Secondary | ICD-10-CM

## 2022-08-21 DIAGNOSIS — D508 Other iron deficiency anemias: Secondary | ICD-10-CM | POA: Diagnosis not present

## 2022-08-21 MED ORDER — CYANOCOBALAMIN 1000 MCG/ML IJ SOLN
1000.0000 ug | Freq: Once | INTRAMUSCULAR | Status: AC
  Administered 2022-08-21: 1000 ug via INTRAMUSCULAR
  Filled 2022-08-21: qty 1

## 2022-08-22 ENCOUNTER — Inpatient Hospital Stay: Payer: 59

## 2022-08-23 ENCOUNTER — Inpatient Hospital Stay: Payer: 59

## 2022-08-24 ENCOUNTER — Inpatient Hospital Stay: Payer: 59

## 2022-08-25 ENCOUNTER — Inpatient Hospital Stay: Payer: 59

## 2022-09-22 ENCOUNTER — Inpatient Hospital Stay: Payer: 59 | Attending: Oncology

## 2022-10-08 ENCOUNTER — Emergency Department
Admission: EM | Admit: 2022-10-08 | Discharge: 2022-10-08 | Disposition: A | Discharge status: Home / Self Care | Payer: 59 | Attending: Emergency Medicine | Admitting: Emergency Medicine

## 2022-10-08 ENCOUNTER — Encounter: Payer: Self-pay | Admitting: Nurse Practitioner

## 2022-10-08 ENCOUNTER — Other Ambulatory Visit: Payer: Self-pay

## 2022-10-08 ENCOUNTER — Emergency Department: Payer: 59

## 2022-10-08 DIAGNOSIS — M545 Low back pain, unspecified: Secondary | ICD-10-CM | POA: Diagnosis present

## 2022-10-08 DIAGNOSIS — M5442 Lumbago with sciatica, left side: Secondary | ICD-10-CM | POA: Diagnosis not present

## 2022-10-08 DIAGNOSIS — M48061 Spinal stenosis, lumbar region without neurogenic claudication: Secondary | ICD-10-CM

## 2022-10-08 LAB — BASIC METABOLIC PANEL
Anion gap: 8 (ref 5–15)
BUN: 14 mg/dL (ref 6–20)
CO2: 28 mmol/L (ref 22–32)
Calcium: 9 mg/dL (ref 8.9–10.3)
Chloride: 105 mmol/L (ref 98–111)
Creatinine, Ser: 0.67 mg/dL (ref 0.44–1.00)
GFR, Estimated: >60 mL/min (ref >60)
Glucose, Bld: 87 mg/dL (ref 70–99)
Potassium: 3.9 mmol/L (ref 3.5–5.1)
Sodium: 141 mmol/L (ref 135–145)

## 2022-10-08 LAB — CBC WITH DIFFERENTIAL/PLATELET
Abs Immature Granulocytes: 0.02 10*3/uL (ref 0.00–0.07)
Basophils Absolute: 0 10*3/uL (ref 0.0–0.1)
Basophils Relative: 0 %
Eosinophils Absolute: 0.1 10*3/uL (ref 0.0–0.5)
Eosinophils Relative: 2 %
HCT: 39 % (ref 36.0–46.0)
Hemoglobin: 12.3 g/dL (ref 12.0–15.0)
Immature Granulocytes: 0 %
Lymphocytes Relative: 30 %
Lymphs Abs: 1.5 10*3/uL (ref 0.7–4.0)
MCH: 29.3 pg (ref 26.0–34.0)
MCHC: 31.5 g/dL (ref 30.0–36.0)
MCV: 92.9 fL (ref 80.0–100.0)
Monocytes Absolute: 0.4 10*3/uL (ref 0.1–1.0)
Monocytes Relative: 8 %
Neutro Abs: 2.9 10*3/uL (ref 1.7–7.7)
Neutrophils Relative %: 60 %
Platelets: 180 10*3/uL (ref 150–400)
RBC: 4.2 MIL/uL (ref 3.87–5.11)
RDW: 13.6 % (ref 11.5–15.5)
WBC: 4.8 10*3/uL (ref 4.0–10.5)
nRBC: 0 % (ref 0.0–0.2)

## 2022-10-08 MED ORDER — HYDROMORPHONE HCL 1 MG/ML IJ SOLN
1.0000 mg | INTRAMUSCULAR | Status: AC
  Administered 2022-10-08: 1 mg via INTRAVENOUS
  Filled 2022-10-08: qty 1

## 2022-10-08 MED ORDER — KETOROLAC TROMETHAMINE 30 MG/ML IJ SOLN
15.0000 mg | Freq: Once | INTRAMUSCULAR | Status: AC
  Administered 2022-10-08: 15 mg via INTRAVENOUS
  Filled 2022-10-08: qty 1

## 2022-10-08 MED ORDER — MORPHINE SULFATE (PF) 4 MG/ML IV SOLN
4.0000 mg | Freq: Once | INTRAVENOUS | Status: AC
  Administered 2022-10-08: 4 mg via INTRAVENOUS
  Filled 2022-10-08: qty 1

## 2022-10-08 MED ORDER — MIDAZOLAM HCL 2 MG/2ML IJ SOLN
2.0000 mg | Freq: Once | INTRAMUSCULAR | Status: AC
  Administered 2022-10-08: 2 mg via INTRAVENOUS
  Filled 2022-10-08: qty 2

## 2022-10-08 MED ORDER — OXYCODONE-ACETAMINOPHEN 5-325 MG PO TABS
1.0000 | ORAL_TABLET | Freq: Once | ORAL | Status: AC
  Administered 2022-10-08: 1 via ORAL
  Filled 2022-10-08: qty 1

## 2022-10-08 MED ORDER — ONDANSETRON HCL 4 MG/2ML IJ SOLN
4.0000 mg | INTRAMUSCULAR | Status: AC
  Administered 2022-10-08: 4 mg via INTRAVENOUS
  Filled 2022-10-08: qty 2

## 2022-10-08 NOTE — ED Provider Notes (Signed)
[facility] Provider Note    Event Date/Time   First MD Initiated Contact with Patient 10/08/22 0448     (approximate)   History   Back Pain   HPI  Marissa [name] is a 47 y.o. female with an extensive history of back problems and neck problems including prior surgeries and infection in the lumbar spine.  She has a Publishing rights manager that she sees in [place] but has not seen recently.  She presents for about 2 weeks of gradually worsening low back pain radiating down her left leg.  It has become severe and it now is causing her to limp severely when she walks.  She said as long she is holding still it seems to be okay.  Although she had an infection in her back, that was about 9 years ago.  She has had sciatica in the past and this feels similar but worse.  She is not having any trouble urinating although she said that she sometimes has been leaking urine recently.  She has no numbness or tingling in her pelvic area.  She has no weakness although moving the leg causes more pain.  No recent fever, nausea, vomiting, chest pain, shortness of breath, nor abdominal pain.     Physical Exam   Triage Vital Signs: ED Triage Vitals  Enc Vitals Group     BP 10/08/22 0436 (!) 170/102     Pulse Rate 10/08/22 0436 (!) 103     Resp 10/08/22 0433 17     Temp 10/08/22 0433 98.3 F (36.8 C)     Temp Source 10/08/22 0433 Oral     SpO2 10/08/22 0436 100 %     Weight 10/08/22 0429 73.1 kg (161 lb 2.5 oz)     Height 10/08/22 0429 1.651 m ('5\' 5"'$ )     Head Circumference --      Peak Flow --      Pain Score 10/08/22 0429 9     Pain Loc --      Pain Edu? --      Excl. in [place]? --     Most recent vital signs: Vitals:   10/08/22 0436 10/08/22 0530  BP: (!) 170/102 (!) 146/91  Pulse: (!) 103 94  Resp: 19 17  Temp:    SpO2: 100% 99%     General: Awake, appears very uncomfortable and to be in pain. CV:  Good peripheral perfusion.  Regular rate and rhythm. Resp:  Normal  effort. Speaking easily and comfortably, no accessory muscle usage nor intercostal retractions.   Abd:  No distention.  Other:  Pain with left straight leg raise and any significant amount of movement of the left leg.  No appreciable weakness although the patient's effort is limited by pain.  She reports tenderness to palpation of the left lower lumbar region of her spine and paraspinal muscles but there is no specific point tenderness palpation of the spine itself.   ED Results / Procedures / Treatments   Labs (all labs ordered are listed, but only abnormal results are displayed) Labs Reviewed  BASIC METABOLIC PANEL  CBC WITH DIFFERENTIAL/PLATELET  URINALYSIS, ROUTINE W REFLEX MICROSCOPIC       RADIOLOGY MR lumbar spine without contrast pending at the time of transfer of care to Dr. Jori Moll.    PROCEDURES:  Critical Care performed: No  Procedures   MEDICATIONS ORDERED IN ED: Medications  morphine (PF) 4 MG/ML injection 4 mg (4 mg Intravenous Given 10/08/22 0527)  ketorolac (TORADOL) 30 MG/ML injection 15 mg (15 mg Intravenous Given 10/08/22 0528)  ondansetron (ZOFRAN) injection 4 mg (4 mg Intravenous Given 10/08/22 0528)  HYDROmorphone (DILAUDID) injection 1 mg (1 mg Intravenous Given 10/08/22 0630)  midazolam (VERSED) injection 2 mg (2 mg Intravenous Given 10/08/22 0630)  oxyCODONE-acetaminophen (PERCOCET/ROXICET) 5-325 MG per tablet 1 tablet (1 tablet Oral Given 10/08/22 0755)     IMPRESSION / MDM / ASSESSMENT AND PLAN / ED COURSE  I reviewed the triage vital signs and the nursing notes.                              Differential diagnosis includes, but is not limited to, herniated disc with sciatica, lumbar radiculopathy, cauda equina syndrome, less likely epidural abscess, osteomyelitis, or transverse myelitis.  Patient's presentation is most consistent with acute presentation with potential threat to life or bodily function.  Labs/studies ordered: BMP, CBC with  differential, urinalysis, MR lumbar spine without contrast. Interventions/Medications given: Morphine 4 mg IV, Zofran 4 mg IV, Toradol 15 mg IV, Dilaudid 1 mg IV, Versed 2 mg IV. [facility] Course my include additional interventions not listed in this section:)  Patient's physical exam is generally reassuring.  Her symptoms are most consistent with lumbar radiculopathy/sciatica.  However given her extensive surgical history including prior infections, I think it is reasonable to obtain an MR of the lumbar spine without contrast.  She has no infectious signs or symptoms that make me concerned about a new low back infectious process, and we should be able to see any significant abnormalities on the study without contrast that might provoke it be to obtain a contrasted study.  She understands and agrees with the plan.  I started with analgesia but also had to add a dose of Versed because she reportedly has claustrophobia.  Transferring ED care to Dr. Jori Moll at 7:00am to follow-up on the imaging.  Patient's lab results are reassuring.    Clinical Course as of 10/08/22 [id number]  Sun Oct 08, 2022  0701 Back pain with prior surgery - prior abscess post surgery.  MR pending. Steroids if MR negative.  [SM]    Clinical Course User Index [SM] Nathaniel Man, MD     FINAL CLINICAL IMPRESSION(S) / ED DIAGNOSES   Final diagnoses:  Acute left-sided low back pain with left-sided sciatica     Rx / DC Orders   ED Discharge Orders     None        Note:  This document was prepared using Dragon voice recognition software and may include unintentional dictation errors.   Hinda Kehr, MD 10/08/22 [id number]

## 2022-10-08 NOTE — ED Provider Notes (Signed)
Care assumed of patient from outgoing provider.  See their note for initial history, exam and plan.  Clinical Course as of 10/08/22 [id number]  Sun Oct 08, 2022  0701 Back pain with prior surgery - prior abscess post surgery.  MR pending. Steroids if MR negative.  [SM]    Clinical Course User Index [SM] Nathaniel Man, MD   MRI with no signs of cauda equina or epidural abscess syndromes.  Redemonstrated severe stenosis to the L3/L5.  Patient's pain improved.  Discussed following up as an outpatient with her spine surgeon.  Encouraged to follow-up in the pain clinic.  Patient is already on oxycodone.  Limited for NSAIDs secondary to history of gastric bypass surgery.  Patient states that steroids have severely increased her glucose in the past and that she would like to not take steroids.  Did not feel that this would improve her symptoms so we will hold on steroids at this time.  Discussed scheduling Tylenol in addition to her oxycodone.  Given return precautions for any worsening symptoms.   Nathaniel Man, MD 10/08/22 [id number]

## 2022-10-08 NOTE — ED Triage Notes (Signed)
Pt arrives via POV with CC of lower back pain that extends to L leg. Pain has been present for the last few weeks but worsened tonight - denies any injury or trauma. Pt able to ambulate with altered gait that pt attributes to pain. Reports hx of sciatica.

## 2022-10-08 NOTE — Discharge Instructions (Signed)
You were seen in the emergency department for lower back pain.  Your MRI showed significant stenosis to your L3-L5.  Call and follow-up with Dr. [name].  Call your primary care physician on Monday to discuss possible pain clinic.  Add Tylenol in addition to your oxycodone.  Pain control:  Acetaminophen (tylenol) - You can take 2 extra strength tablets (1000 mg) every 6 hours as needed for pain/.

## 2022-10-20 ENCOUNTER — Inpatient Hospital Stay: Payer: 59 | Attending: Oncology

## 2022-10-20 DIAGNOSIS — D509 Iron deficiency anemia, unspecified: Secondary | ICD-10-CM | POA: Insufficient documentation

## 2022-10-20 DIAGNOSIS — E538 Deficiency of other specified B group vitamins: Secondary | ICD-10-CM | POA: Insufficient documentation

## 2022-10-31 ENCOUNTER — Inpatient Hospital Stay: Payer: 59

## 2022-10-31 DIAGNOSIS — D509 Iron deficiency anemia, unspecified: Secondary | ICD-10-CM | POA: Diagnosis not present

## 2022-10-31 DIAGNOSIS — E538 Deficiency of other specified B group vitamins: Secondary | ICD-10-CM | POA: Diagnosis present

## 2022-10-31 MED ORDER — CYANOCOBALAMIN 1000 MCG/ML IJ SOLN
1000.0000 ug | Freq: Once | INTRAMUSCULAR | Status: AC
  Administered 2022-10-31: 1000 ug via INTRAMUSCULAR
  Filled 2022-10-31: qty 1

## 2022-11-21 ENCOUNTER — Inpatient Hospital Stay: Payer: 59

## 2022-11-23 ENCOUNTER — Other Ambulatory Visit: Payer: Self-pay | Admitting: Neurosurgery

## 2022-11-23 DIAGNOSIS — M5440 Lumbago with sciatica, unspecified side: Secondary | ICD-10-CM

## 2022-11-27 ENCOUNTER — Encounter: Payer: Self-pay | Admitting: Nurse Practitioner

## 2022-12-01 ENCOUNTER — Inpatient Hospital Stay: Payer: 59 | Attending: Oncology

## 2022-12-01 DIAGNOSIS — D509 Iron deficiency anemia, unspecified: Secondary | ICD-10-CM | POA: Insufficient documentation

## 2022-12-01 DIAGNOSIS — E538 Deficiency of other specified B group vitamins: Secondary | ICD-10-CM | POA: Diagnosis present

## 2022-12-01 MED ORDER — CYANOCOBALAMIN 1000 MCG/ML IJ SOLN
1000.0000 ug | Freq: Once | INTRAMUSCULAR | Status: AC
  Administered 2022-12-01: 1000 ug via INTRAMUSCULAR
  Filled 2022-12-01: qty 1

## 2022-12-04 ENCOUNTER — Ambulatory Visit
Admission: RE | Admit: 2022-12-04 | Discharge: 2022-12-04 | Disposition: A | Discharge status: Home / Self Care | Payer: PPO | Source: Ambulatory Visit | Attending: Neurosurgery | Admitting: Neurosurgery

## 2022-12-04 DIAGNOSIS — M5440 Lumbago with sciatica, unspecified side: Secondary | ICD-10-CM

## 2022-12-21 ENCOUNTER — Inpatient Hospital Stay: Payer: 59

## 2023-01-01 ENCOUNTER — Inpatient Hospital Stay: Payer: 59 | Attending: Oncology

## 2023-01-02 ENCOUNTER — Ambulatory Visit: Payer: 59

## 2023-01-04 ENCOUNTER — Ambulatory Visit: Payer: 59 | Attending: Neurosurgery

## 2023-01-04 DIAGNOSIS — M545 Low back pain, unspecified: Secondary | ICD-10-CM | POA: Diagnosis present

## 2023-01-04 DIAGNOSIS — G8929 Other chronic pain: Secondary | ICD-10-CM | POA: Diagnosis present

## 2023-01-04 DIAGNOSIS — M5459 Other low back pain: Secondary | ICD-10-CM | POA: Insufficient documentation

## 2023-01-04 DIAGNOSIS — M25512 Pain in left shoulder: Secondary | ICD-10-CM | POA: Diagnosis present
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
## 2023-01-10 ENCOUNTER — Ambulatory Visit: Payer: 59

## 2023-01-10 DIAGNOSIS — G8929 Other chronic pain: Secondary | ICD-10-CM

## 2023-01-10 DIAGNOSIS — M545 Low back pain, unspecified: Secondary | ICD-10-CM | POA: Diagnosis not present
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
## 2023-01-16 ENCOUNTER — Ambulatory Visit: Payer: 59 | Attending: Neurosurgery

## 2023-01-18 ENCOUNTER — Ambulatory Visit: Payer: 59

## 2023-01-18 ENCOUNTER — Telehealth: Payer: Self-pay

## 2023-01-18 NOTE — Telephone Encounter (Signed)
Called patient and voicemail was not set up.  Patient called office back and stated she had a missed call and said it was probably about my appt today that I didn't come to but I am hurting too bad.  I explained to the patient that this was her second no show and asked if she was wanting to continue with treatment or be placed on hold due to pain, and patient stated she wanted to continue. Confirmed next appt on 6/11 with patient and explained that if she did not show that all remaining appts would be removed from schedule, and patient voiced understanding.  Rosana Fret [facility]

## 2023-01-22 ENCOUNTER — Inpatient Hospital Stay: Payer: 59

## 2023-01-23 ENCOUNTER — Encounter: Payer: Self-pay | Admitting: Nurse Practitioner

## 2023-01-23 ENCOUNTER — Ambulatory Visit: Payer: 59

## 2023-01-23 ENCOUNTER — Telehealth: Payer: Self-pay

## 2023-01-25 ENCOUNTER — Ambulatory Visit: Payer: 59

## 2023-01-25 ENCOUNTER — Telehealth: Payer: Self-pay

## 2023-01-25 NOTE — Telephone Encounter (Signed)
Patient no showed for the 4th consecutive PT appt today.  Confirmed last Thursday with patient her appt on 6/11, which she called to r/s to later in the day and then proceeded to no show to.  Patient had no contact to office today. All appointments for PT have been removed and patient has been d/c in [facility] site due to non compliance.

## 2023-01-25 NOTE — Telephone Encounter (Signed)
Patient was scheduled for an appt on 6/11 at 945 and called to reschedule to 315 due to car trouble.  Patient then no showed at 315pm for PT appt  \

## 2023-01-30 ENCOUNTER — Other Ambulatory Visit: Payer: Self-pay

## 2023-01-30 ENCOUNTER — Encounter: Payer: Self-pay | Admitting: *Deleted

## 2023-01-30 ENCOUNTER — Emergency Department
Admission: EM | Admit: 2023-01-30 | Discharge: 2023-01-30 | Disposition: A | Discharge status: Home / Self Care | Payer: 59 | Attending: Emergency Medicine | Admitting: Emergency Medicine

## 2023-01-30 ENCOUNTER — Emergency Department: Payer: 59

## 2023-01-30 DIAGNOSIS — E1140 Type 2 diabetes mellitus with diabetic neuropathy, unspecified: Secondary | ICD-10-CM | POA: Insufficient documentation

## 2023-01-30 DIAGNOSIS — I2510 Atherosclerotic heart disease of native coronary artery without angina pectoris: Secondary | ICD-10-CM | POA: Insufficient documentation

## 2023-01-30 DIAGNOSIS — N3090 Cystitis, unspecified without hematuria: Secondary | ICD-10-CM

## 2023-01-30 DIAGNOSIS — R0789 Other chest pain: Secondary | ICD-10-CM | POA: Insufficient documentation

## 2023-01-30 DIAGNOSIS — R0602 Shortness of breath: Secondary | ICD-10-CM | POA: Diagnosis not present

## 2023-01-30 DIAGNOSIS — I509 Heart failure, unspecified: Secondary | ICD-10-CM | POA: Diagnosis not present

## 2023-01-30 DIAGNOSIS — R000 Tachycardia, unspecified: Secondary | ICD-10-CM | POA: Diagnosis not present

## 2023-01-30 DIAGNOSIS — I110 Hypertensive heart disease with heart failure: Secondary | ICD-10-CM | POA: Diagnosis not present

## 2023-01-30 DIAGNOSIS — R3915 Urgency of urination: Secondary | ICD-10-CM | POA: Diagnosis present

## 2023-01-30 LAB — CBC
HCT: 40.3 % (ref 36.0–46.0)
Hemoglobin: 12.5 g/dL (ref 12.0–15.0)
MCH: 29.6 pg (ref 26.0–34.0)
MCHC: 31 g/dL (ref 30.0–36.0)
MCV: 95.5 fL (ref 80.0–100.0)
Platelets: 204 10*3/uL (ref 150–400)
RBC: 4.22 MIL/uL (ref 3.87–5.11)
RDW: 13.5 % (ref 11.5–15.5)
WBC: 6.1 10*3/uL (ref 4.0–10.5)
nRBC: 0 % (ref 0.0–0.2)

## 2023-01-30 LAB — BASIC METABOLIC PANEL
Anion gap: 11 (ref 5–15)
BUN: 16 mg/dL (ref 6–20)
CO2: 24 mmol/L (ref 22–32)
Calcium: 8.9 mg/dL (ref 8.9–10.3)
Chloride: 107 mmol/L (ref 98–111)
Creatinine, Ser: 0.92 mg/dL (ref 0.44–1.00)
GFR, Estimated: >60 mL/min (ref >60)
Glucose, Bld: 153 mg/dL — ABNORMAL HIGH (ref 70–99)
Potassium: 3.8 mmol/L (ref 3.5–5.1)
Sodium: 142 mmol/L (ref 135–145)

## 2023-01-30 LAB — D-DIMER, QUANTITATIVE: D-Dimer, Quant: 0.3 ug/mL-FEU (ref 0.00–0.50)

## 2023-01-30 LAB — TROPONIN I (HIGH SENSITIVITY): Troponin I (High Sensitivity): 4 ng/L (ref <18)

## 2023-01-30 MED ORDER — LACTATED RINGERS IV BOLUS
1000.0000 mL | Freq: Once | INTRAVENOUS | Status: AC
  Administered 2023-01-30: 1000 mL via INTRAVENOUS

## 2023-01-30 MED ORDER — GENTAMICIN SULFATE 40 MG/ML IJ SOLN
5.0000 mg/kg | Freq: Once | INTRAVENOUS | Status: AC
  Administered 2023-01-30: 350 mg via INTRAVENOUS
  Filled 2023-01-30: qty 8.75

## 2023-01-30 MED ORDER — FOSFOMYCIN TROMETHAMINE 3 G PO PACK
3.0000 g | PACK | Freq: Once | ORAL | Status: AC
  Administered 2023-01-30: 3 g via ORAL
  Filled 2023-01-30: qty 3

## 2023-01-30 MED ORDER — FOSFOMYCIN TROMETHAMINE 3 G PO PACK: 3.0000 g | PACK | Freq: Once | ORAL | 0 refills | Status: AC

## 2023-01-30 NOTE — ED Provider Notes (Signed)
[facility] Provider Note    Event Date/Time   First MD Initiated Contact with Patient 01/30/23 1940     (approximate)   History   Shortness of Breath   HPI  Marissa [name] is a 47 y.o. female past medical history of nonischemic cardiomyopathy, coronary disease, hyperlipidemia, diabetes, prior gastric sleeve who presents because her doctor told her she needed IV antibiotics.  Patient has had shortness of breath over the last several days.  Has had minimal cough some occasional chest pressure.  Tells me the chest pain is more of a chronic issue.  Shortness of breath comes and goes denies lower extremity swelling.  She has also had urinary urgency and frequency and some foul odor to her urine and her doctor checked her urinalysis and she was told she had a UTI.  She was started on Macrobid but then was called today saying that she had pneumonia and that she should come to the ER to get IV antibiotics     Past Medical History:  Diagnosis Date   Allergy    Anemia    Anxiety    Arthritis    Back pain, chronic    Cardiomyopathy, dilated, nonischemic (HCC)    CHF (congestive heart failure) (HCC)    Cholelithiasis    Coronary artery disease    Depression    Diabetes mellitus without complication (HCC)    fasting cbg 50-140s   Dysrhythmia    tachycardia   GERD (gastroesophageal reflux disease)    otc meds   Headache(784.0)    Hypercholesteremia    Hyperlipemia    Hypertension    IDA (iron deficiency anemia) 09/29/2019   Insulin pump in place    pt had insulin pump but it is now removed (02-18-16)   MI, old    2006   Migraines    once/month maybe - can last up to two weeks   Myocardial infarction [facility]) 2006   "due to medication"; no evidence of ischemia or infarction by nuclear stress test '11   Neck pain, chronic    Neuropathy    legs   Pneumonia 2015   [facility]   Prolonged QT interval syndrome    Renal insufficiency    Restless leg syndrome,  controlled    Sepsis (HCC) 2016   secondary to surgery   Sleep apnea    does not use cpap since losing alot of weight   Vertigo    nop episodes 2020   Vision loss    due to diabetes   Wears contact lenses    Wears dentures    partial lower    Patient Active Problem List   Diagnosis Date Noted   Vitamin B12 deficiency 08/16/2022   IDA (iron deficiency anemia) 09/29/2019   Abdominal wall abscess 04/06/2018   Chronic superficial gastritis without bleeding 04/02/2018   Abscess in epidural space of lumbar spine 04/02/2018   Acute abdomen 03/16/2018   Anastomotic leak of intestine    Peritonitis (HCC)    Pelvic pain 03/08/2018   Impingement syndrome of left shoulder 02/07/2018   Type 2 diabetes mellitus without complication, without long-term current use of insulin (HCC) 11/28/2017   Other fatigue 11/02/2017   Chronic tension-type headache, intractable 09/19/2017   Primary osteoarthritis of left knee 09/10/2017   Polyarthralgia 09/10/2017   Chronic left shoulder pain 09/10/2017   Restless leg syndrome 05/23/2017   MI, old 05/23/2017   Congestive heart failure (HCC) 05/23/2017   Chronic painful  diabetic neuropathy (HCC) 02/06/2017   Diabetes mellitus (HCC) 11/21/2016   Obstructive sleep apnea syndrome 11/21/2016   Dyslipidemia 11/21/2016   Paresthesia 09/06/2016   Primary insomnia 03/14/2016   Complete tear of right rotator cuff 02/01/2016   Cardiomyopathy (HCC) 04/23/2015   Long term current use of antibiotics 04/23/2015   Biliary colic 04/08/2015   Calculus of gallbladder with acute cholecystitis 04/07/2015   Bilateral occipital neuralgia 01/21/2015   Dizziness 01/04/2015   Headache 01/04/2015   DDD (degenerative disc disease), cervical 12/18/2014   DDD (degenerative disc disease), thoracic 12/18/2014   DDD (degenerative disc disease), lumbosacral 12/18/2014   Neuropathy due to secondary diabetes (HCC) 12/18/2014   Migraine 12/18/2014   Hypercholesterolemia without  hypertriglyceridemia 08/28/2014   HPV test positive 07/17/2014   Class 2 obesity 04/15/2014   CAD in native artery 04/15/2014   Breathlessness on exertion 03/24/2014   Wound infection complicating hardware (HCC) 02/25/2014   Nausea alone 02/01/2014   Abdominal pain, unspecified site 02/01/2014   Severe sepsis(995.92) 01/30/2014   History of lumbar laminectomy 01/30/2014   Acute renal failure (HCC) 01/30/2014   Anemia 01/30/2014   HTN (hypertension) 01/30/2014   DM (diabetes mellitus), type 2 with renal complications (HCC) 01/30/2014   Wound infection 01/25/2014   Acute respiratory failure with hypoxia (HCC) 01/25/2014   Type 2 diabetes mellitus treated with insulin (HCC) 01/13/2014   Essential (primary) hypertension 01/13/2014   Difficulty speaking 01/13/2014   Clinical depression 01/13/2014   Insulin dependent type 2 diabetes mellitus (HCC) 01/13/2014   Back pain 11/01/2013   HNP (herniated nucleus pulposus), lumbar 10/29/2013     Physical Exam  Triage Vital Signs: ED Triage Vitals  Enc Vitals Group     BP 01/30/23 1926 (!) 138/100     Pulse Rate 01/30/23 1926 (!) 110     Resp 01/30/23 1926 18     Temp 01/30/23 1926 98.6 F (37 C)     Temp Source 01/30/23 1926 Oral     SpO2 01/30/23 1926 99 %     Weight 01/30/23 1918 155 lb (70.3 kg)     Height 01/30/23 1918 5\' 5"  (1.651 m)     Head Circumference --      Peak Flow --      Pain Score 01/30/23 1918 9     Pain Loc --      Pain Edu? --      Excl. in GC? --     Most recent vital signs: Vitals:   01/30/23 1926 01/30/23 2202  BP: (!) 138/100   Pulse: (!) 110 (!) 103  Resp: 18   Temp: 98.6 F (37 C)   SpO2: 99%      General: Awake, no distress.  CV:  Good peripheral perfusion. No edema Resp:  Normal effort.  Lung sounds are clear Abd:  No distention.  Mild tenderness in the periumbilical region but no guarding Neuro:             Awake, Alert, Oriented x 3  Other:  No CVA tenderness   ED Results / Procedures  / Treatments  Labs (all labs ordered are listed, but only abnormal results are displayed) Labs Reviewed  BASIC METABOLIC PANEL - Abnormal; Notable for the following components:      Result Value   Glucose, Bld 153 (*)    All other components within normal limits  CBC  D-DIMER, QUANTITATIVE  URINALYSIS, ROUTINE W REFLEX MICROSCOPIC  POC URINE PREG, ED  TROPONIN I (HIGH SENSITIVITY)  EKG  EKG interpretation performed by myself: NSR, nml axis, nml intervals, no acute ischemic changes    RADIOLOGY I reviewed and interpreted the CXR which does not show any acute cardiopulmonary process    PROCEDURES:  Critical Care performed: No  .1-3 Lead EKG Interpretation  Performed by: Georga Hacking, MD Authorized by: Georga Hacking, MD     Interpretation: abnormal     ECG rate assessment: tachycardic     Rhythm: sinus tachycardia     Ectopy: none     Conduction: normal     The patient is on the cardiac monitor to evaluate for evidence of arrhythmia and/or significant heart rate changes.   MEDICATIONS ORDERED IN ED: Medications  lactated ringers bolus 1,000 mL (1,000 mLs Intravenous New Bag/Given 01/30/23 2045)  gentamicin (GARAMYCIN) 350 mg in dextrose 5 % 50 mL IVPB (350 mg Intravenous New Bag/Given 01/30/23 2105)  fosfomycin (MONUROL) packet 3 g (3 g Oral Given 01/30/23 2045)     IMPRESSION / MDM / ASSESSMENT AND PLAN / ED COURSE  I reviewed the triage vital signs and the nursing notes.                              Patient's presentation is most consistent with acute presentation with potential threat to life or bodily function.  Differential diagnosis includes, but is not limited to, cystitis, pyelonephritis, pneumonia, CHF, PE, sepsis  The patient is a 47 year old female with multiple prior medical comorbidities including history of CHF with EF of around 50%, multiple prior abdominal surgeries who presents today because she was told she needed IV antibiotics.   For the last several days she has been feeling somewhat short of breath saw her doctor and had blood work and an Museum/gallery curator.  She is also having some lower urinary tract symptoms that they did a urinalysis on 614.  She was called today and told to come to the hospital.  Had been prescribed Macrobid for UTI but she was told she needed IV antibiotics.  Patient was told she had normal  .  Patient mildly hypertensive and tachycardic to the 1 teens.  On exam she looks well and nontoxic.  Lung sounds are clear she is not in respiratory distress satting 100%.  Abdominal exam is overall benign she has some mild umbilical tenderness but she is denying any new abdominal pain that is outside the ordinary for her.  I did review patient's urinalysis and urine culture.  Urinalysis was very positive and urine cultures growing Klebsiella pneumonia which I think is why there was some confusion about patient needing IV antibiotics for pneumonia.  The Klebsiella is rather resistant, consented to cefepime gent and carbapenems and Zosyn.  Patient's labs here are reassuring troponin negative no leukocytosis or anemia and creatinine is stable.  Chest x-ray is clear and EKG is nonischemic.  Patient overall looks well and I have low suspicion for sepsis clinically.  She is having urinary urgency and a foul odor and has had several episodes of emesis over the last several days but is not febrile and has no flank pain.  I thus did discuss with pharmacy utility of fosfomycin and they did think that she could be treated as an outpatient with a dose of fosfomycin in the ED and then another dose in 2 days.  They also recommended giving a dose of cefepime urgent in the ED as a one-time dose.  Patient did  remain mildly tachycardic she did receive some fluid.  Tells me she is typically tachycardic when she comes to the doctor because she is anxious.  I did look at patient's prior heart rates and she typically runs in the high 90s to low  100s.  Did also check a D-dimer given the tachycardia and this is negative.   Clinical Course as of 01/30/23 2211  Tue Jan 30, 2023  2117 D-Dimer, Quant: 0.30 [KM]    Clinical Course User Index [KM] Georga Hacking, MD     FINAL CLINICAL IMPRESSION(S) / ED DIAGNOSES   Final diagnoses:  Cystitis     Rx / DC Orders   ED Discharge Orders          Ordered    fosfomycin (MONUROL) 3 g PACK   Once        01/30/23 2154             Note:  This document was prepared using Dragon voice recognition software and may include unintentional dictation errors.   Georga Hacking, MD 01/30/23 2211

## 2023-01-30 NOTE — ED Triage Notes (Addendum)
Pt sent to ER for eval of pneumonia.  Dx last week by PMD.  Pt is taking abx but needs IV abx.  Pt reports a cough.  Pt also has chest pain.  Pt alert  speech clear.  Recent uti, continues to have sx per pt.

## 2023-01-30 NOTE — Discharge Instructions (Addendum)
You are growing a resistant bacteria in your urine which is causing your burning and odor to your urine.  You were given a dose of IV antibiotics in the ER and a dose of fosfomycin.  You will take the second dose of fosfomycin in 48 hours.  You should start feeling improved in the next 24 to 48 hours.  If you are developing fever, abdominal pain, flank pain or not able to keep fluids down, then please return to the emergency department.

## 2023-01-31 ENCOUNTER — Inpatient Hospital Stay: Payer: 59 | Attending: Oncology

## 2023-02-14 ENCOUNTER — Other Ambulatory Visit: Payer: 59

## 2023-02-20 ENCOUNTER — Ambulatory Visit: Payer: 59 | Admitting: Oncology

## 2023-02-20 ENCOUNTER — Ambulatory Visit: Payer: 59

## 2023-02-26 ENCOUNTER — Inpatient Hospital Stay: Payer: 59 | Attending: Oncology

## 2023-02-26 ENCOUNTER — Ambulatory Visit
Admission: RE | Admit: 2023-02-26 | Discharge: 2023-02-26 | Disposition: A | Discharge status: Home / Self Care | Payer: 59 | Source: Ambulatory Visit | Attending: Infectious Diseases | Admitting: Infectious Diseases

## 2023-02-26 DIAGNOSIS — B998 Other infectious disease: Secondary | ICD-10-CM | POA: Insufficient documentation

## 2023-02-26 DIAGNOSIS — B999 Unspecified infectious disease: Secondary | ICD-10-CM | POA: Diagnosis present

## 2023-02-26 MED ORDER — SODIUM CHLORIDE 0.9 % IV SOLN
1.0000 g | INTRAVENOUS | Status: DC
  Administered 2023-02-26: 1000 mg via INTRAVENOUS
  Filled 2023-02-26 (×2): qty 1

## 2023-02-27 ENCOUNTER — Inpatient Hospital Stay: Admission: RE | Admit: 2023-02-27 | Payer: 59 | Source: Ambulatory Visit

## 2023-02-27 ENCOUNTER — Ambulatory Visit
Admission: RE | Admit: 2023-02-27 | Discharge: 2023-02-27 | Disposition: A | Discharge status: Home / Self Care | Payer: 59 | Source: Ambulatory Visit | Attending: Infectious Diseases | Admitting: Infectious Diseases

## 2023-02-27 DIAGNOSIS — N3090 Cystitis, unspecified without hematuria: Secondary | ICD-10-CM | POA: Diagnosis not present

## 2023-02-27 MED ORDER — SODIUM CHLORIDE 0.9 % IV SOLN
1.0000 g | Freq: Once | INTRAVENOUS | Status: AC
  Administered 2023-02-27: 1000 mg via INTRAVENOUS
  Filled 2023-02-27: qty 1

## 2023-02-28 ENCOUNTER — Ambulatory Visit
Admission: RE | Admit: 2023-02-28 | Discharge: 2023-02-28 | Disposition: A | Discharge status: Home / Self Care | Payer: 59 | Source: Ambulatory Visit | Attending: Infectious Diseases | Admitting: Infectious Diseases

## 2023-02-28 DIAGNOSIS — N390 Urinary tract infection, site not specified: Secondary | ICD-10-CM | POA: Insufficient documentation

## 2023-02-28 MED ORDER — SODIUM CHLORIDE 0.9 % IV SOLN
1.0000 g | Freq: Once | INTRAVENOUS | Status: AC
  Administered 2023-02-28: 1000 mg via INTRAVENOUS
  Filled 2023-02-28: qty 1

## 2023-03-02 ENCOUNTER — Encounter: Payer: Self-pay | Admitting: Nurse Practitioner

## 2023-03-02 ENCOUNTER — Encounter: Payer: Self-pay | Admitting: Urology

## 2023-03-02 ENCOUNTER — Ambulatory Visit (INDEPENDENT_AMBULATORY_CARE_PROVIDER_SITE_OTHER): Payer: 59 | Admitting: Urology

## 2023-03-02 VITALS — BP 129/85 | HR 105 | Ht 65.0 in | Wt 155.0 lb

## 2023-03-02 DIAGNOSIS — R82998 Other abnormal findings in urine: Secondary | ICD-10-CM | POA: Diagnosis not present

## 2023-03-02 DIAGNOSIS — R8271 Bacteriuria: Secondary | ICD-10-CM

## 2023-03-02 DIAGNOSIS — N390 Urinary tract infection, site not specified: Secondary | ICD-10-CM

## 2023-03-02 LAB — MICROSCOPIC EXAMINATION

## 2023-03-02 LAB — URINALYSIS, COMPLETE
Bilirubin, UA: NEGATIVE
Glucose, UA: NEGATIVE
Leukocytes,UA: NEGATIVE
Nitrite, UA: NEGATIVE
Protein,UA: NEGATIVE
RBC, UA: NEGATIVE
Specific Gravity, UA: >1.03 — ABNORMAL HIGH (ref 1.005–1.030)
Urobilinogen, Ur: 0.2 mg/dL (ref 0.2–1.0)
pH, UA: 5 (ref 5.0–7.5)

## 2023-03-02 LAB — BLADDER SCAN AMB NON-IMAGING: Scan Result: 0

## 2023-03-02 MED FILL — Iron Sucrose Inj 20 MG/ML (Fe Equiv): INTRAVENOUS | Qty: 10 | Status: AC

## 2023-03-02 NOTE — Patient Instructions (Addendum)
D-mannose and cranberry  

## 2023-03-02 NOTE — Progress Notes (Signed)
I, Marissa [name],acting as a scribe for Riki Altes, MD.,have documented all relevant documentation on the behalf of Riki Altes, MD,as directed by  Riki Altes, MD while in the presence of Riki Altes, MD.  03/02/2023 10:29 AM   Marissa [name] Dec 10, 1975 [id number]  Referring provider: Marisue Ivan, MD [phone] [facility] [address] [facility] [place],  [place] [address]  Chief Complaint  Patient presents with   New Patient (Initial Visit)    HPI: Marissa [name] is a 47 y.o. female referred for recurrent UTI.  Seen by PCP 01/26/2023 with complaints of malodorous urine. Urinalysis grew multi-drug resistant Klebsiella. She was treated with antibiotics and had persistent symptoms and follow-up with persistent pyuria with positive culture for Klebsiella. Subsequently referred to infectious disease and was treated with fosfomycin every other day for 4 days and subsequently sent up for IV antibiotic therapy, which she completed earlier this week.  No dysuria, frequency, or pelvic pain with only symptoms being malodorous and cloudy urine.  Approximately 2 years ago, had 2 infections though they were not multi-drug-resistant organisms.  Had CT abdomen pelvis performed in November 2023, which showed no upper tract abnormalities. Presently asymptomatic.   PMH: Past Medical History:  Diagnosis Date   Allergy    Anemia    Anxiety    Arthritis    Back pain, chronic    Cardiomyopathy, dilated, nonischemic (HCC)    CHF (congestive heart failure) (HCC)    Cholelithiasis    Coronary artery disease    Depression    Diabetes mellitus without complication (HCC)    fasting cbg 50-140s   Dysrhythmia    tachycardia   GERD (gastroesophageal reflux disease)    otc meds   Headache(784.0)    Hypercholesteremia    Hyperlipemia    Hypertension    IDA (iron deficiency anemia) 09/29/2019   Insulin pump in place    pt had insulin pump but it is now removed (02-18-16)    MI, old    2006   Migraines    once/month maybe - can last up to two weeks   Myocardial infarction [facility]) 2006   "due to medication"; no evidence of ischemia or infarction by nuclear stress test '11   Neck pain, chronic    Neuropathy    legs   Pneumonia 2015   [facility]   Prolonged QT interval syndrome    Renal insufficiency    Restless leg syndrome, controlled    Sepsis (HCC) 2016   secondary to surgery   Sleep apnea    does not use cpap since losing alot of weight   Vertigo    nop episodes 2020   Vision loss    due to diabetes   Wears contact lenses    Wears dentures    partial lower    Surgical History: Past Surgical History:  Procedure Laterality Date   ACHILLES TENDON SURGERY Right 04/22/2019   Procedure: [facility] PROCEDURE WITH SUTURE ANCHOR;  Surgeon: Rosetta Posner, DPM;  Location: [facility];  Service: Podiatry;  Laterality: Right;   APPENDECTOMY N/A 03/08/2018   Procedure: APPENDECTOMY;  Surgeon: Christeen Douglas, MD;  Location: [facility] ORS;  Service: Gynecology;  Laterality: N/A;  By Dr. Ara Kussmaul SURGERY  2011   Lumbar    BOWEL RESECTION N/A 03/08/2018   Procedure: SMALL BOWEL RESECTION;  Surgeon: Christeen Douglas, MD;  Location: [facility] ORS;  Service: Gynecology;  Laterality: N/A;   CARDIAC CATHETERIZATION  07/18/2004   50-60% mid LAD, minor luminal irregularities RCA, normal LM and CX, EF 50-55% [facility])   carpel tunnel Bilateral    CERVICAL FUSION  2006   CHOLECYSTECTOMY N/A 04/08/2015   Procedure: LAPAROSCOPIC CHOLECYSTECTOMY WITH INTRAOPERATIVE CHOLANGIOGRAM;  Surgeon: Lattie Haw, MD;  Location: [facility] ORS;  Service: General;  Laterality: N/A;   COLON RESECTION N/A 03/16/2018   Procedure: COLON RESECTION;  Surgeon: Lattie Haw, MD;  Location: [facility] OR[facility];  Service: General;  Laterality: N/A;   COLONOSCOPY WITH PROPOFOL N/A 10/22/2019   Procedure: COLONOSCOPY WITH PROPOFOL;  Surgeon: Toledo, Boykin Nearing, MD;  Location: [facility]  ENDOSCOPY;  Service: Gastroenterology;  Laterality: N/A;  [facility] DID RAPID TEST; COPY ON CHART   ESOPHAGOGASTRODUODENOSCOPY (EGD) WITH PROPOFOL N/A 10/22/2019   Procedure: ESOPHAGOGASTRODUODENOSCOPY (EGD) WITH PROPOFOL;  Surgeon: Toledo, Boykin Nearing, MD;  Location: [facility] ENDOSCOPY;  Service: Gastroenterology;  Laterality: N/A;   HAMMER TOE SURGERY Right 04/22/2019   Procedure: HAMMER TOE CORRECTION X 4;  Surgeon: Rosetta Posner, DPM;  Location: [facility];  Service: Podiatry;  Laterality: Right;  Diabetic - insulin and oral meds   LAPAROSCOPIC GASTRECTOMY  05/31/2017   [facility], Dr. Smitty Cords   LUMBAR LAMINECTOMY/DECOMPRESSION MICRODISCECTOMY Right 05/07/2013   Procedure: Right Lumbar one-two laminectomy;  Surgeon: Mariam Dollar, MD;  Location: [facility];  Service: Neurosurgery;  Laterality: Right;   LUMBAR LAMINECTOMY/DECOMPRESSION MICRODISCECTOMY Left 10/29/2013   Procedure: Left Lumbar five-Sacral one Laminectomy;  Surgeon: Mariam Dollar, MD;  Location: [facility];  Service: Neurosurgery;  Laterality: Left;   LUMBAR WOUND DEBRIDEMENT N/A 01/25/2014   Procedure: LUMBAR WOUND DEBRIDEMENT;  Surgeon: Temple Pacini, MD;  Location: [facility];  Service: Neurosurgery;  Laterality: N/A;   LUMBAR WOUND DEBRIDEMENT N/A 02/25/2014   Procedure: LUMBAR WOUND DEBRIDEMENT;  Surgeon: Mariam Dollar, MD;  Location: [facility];  Service: Neurosurgery;  Laterality: N/A;   LYSIS OF ADHESION N/A 03/08/2018   Procedure: LYSIS OF ADHESION;  Surgeon: Christeen Douglas, MD;  Location: [facility] OR[facility];  Service: Gynecology;  Laterality: N/A;   SALPINGOOPHORECTOMY Bilateral    1 1997. 2nd 2001   SHOULDER ARTHROSCOPY WITH BICEPSTENOTOMY Left 03/24/2016   Procedure: shoulder arthroscopy with biceps TENOTOMY, removal loose body, limited synovectomy;  Surgeon: Erin Sons, MD;  Location: [facility] OR[facility];  Service: Orthopedics;  Laterality: Left;   shoulder sugery     7/17 rotator cuff   SMALL BOWEL REPAIR N/A 03/08/2018   Procedure: SMALL  BOWEL REPAIR;  Surgeon: Christeen Douglas, MD;  Location: [facility];  Service: Gynecology;  Laterality: N/A;    Home Medications:  Allergies as of 03/02/2023       Reactions   Other Hives, Rash, Other (See Comments), Itching   Greek yogurt only, gastric by pass  [place] yogurt only   Bactrim [sulfamethoxazole-trimethoprim] Nausea And Vomiting   Ciprofloxacin Swelling   Other reaction(s): edema Lip swelling   Vancomycin Nausea And Vomiting, Nausea Only   Other reaction(s): vomiting   Erythromycin Itching   Other reaction(s): Not available diarrhea   Nsaids    Avoids due to gastric bypass   Sulfamethoxazole-trimethoprim    Other reaction(s): Not available   Amoxicillin Nausea And Vomiting, Nausea Only, Rash   Patient tolerated Zosyn during the whole admission Has patient had a PCN reaction causing immediate rash, facial/tongue/throat swelling, SOB or lightheadedness with hypotension: No Has patient had a PCN reaction causing severe rash involving mucus membranes or skin necrosis: No Has patient had a  PCN reaction that required hospitalization: No Has patient had a PCN reaction occurring within the last 10 years: Yes If all of the above answers are "NO", then may proceed with Cephalosporin use. Other reaction(s): vomiting Patient tolerated Zosyn during the whole admission Has patient had a PCN reaction causing immediate rash, facial/tongue/throat swelling, SOB or lightheadedness with hypotension: No Has patient had a PCN reaction causing severe rash involving mucus membranes or skin necrosis: No Has patient had a PCN reaction that required hospitalization: No Has patient had a PCN reaction occurring within the last 10 years: Yes If all of the above answers are "NO", then may proceed with Cephalosporin use. Patient tolerated Zosyn during the whole admission Has patient had a PCN reaction causing immediate rash, facial/tongue/throat swelling, SOB or lightheadedness with hypotension:  No Has patient had a PCN reaction causing severe rash involving mucus membranes or skin necrosis: No Has patient had a PCN reaction that required hospitalization: No Has patient had a PCN reaction occurring within the last 10 years: Yes If all of the above answers are "NO", then may proceed with Cephalosporin use.   Azithromycin Itching, Rash   Cefotaxime Nausea Only, Nausea And Vomiting, Rash   Other reaction(s): vomiting   Ceftriaxone Nausea And Vomiting   Food Itching, Rash   "Greek yogurt"        Medication List        Accurate as of March 02, 2023 10:29 AM. If you have any questions, ask your nurse or doctor.          STOP taking these medications    fluconazole 150 MG tablet Commonly known as: Diflucan Stopped by: Verna Czech Elo Marmolejos   ondansetron 4 MG disintegrating tablet Commonly known as: ZOFRAN-ODT Stopped by: Riki Altes   scopolamine 1 MG/3DAYS Commonly known as: TRANSDERM-SCOP Stopped by: Riki Altes       TAKE these medications    acetaminophen 500 MG tablet Commonly known as: TYLENOL Take 1,000 mg by mouth 2 (two) times daily as needed for moderate pain or headache.   Ajovy 225 MG/1.5ML Soaj Generic drug: Fremanezumab-vfrm Inject into the skin as directed.   BIOTIN PO Take 1 tablet by mouth daily.   Climara Pro 0.045-0.015 MG/DAY Generic drug: estradiol-levonorgestrel Place 1 patch onto the skin every 7 (seven) days.   cyclobenzaprine 10 MG tablet Commonly known as: FLEXERIL Take 1 tablet (10 mg total) by mouth 3 (three) times daily as needed.   diphenhydrAMINE 25 mg capsule Commonly known as: BENADRYL Take 25 mg by mouth every 6 (six) hours as needed.   moxifloxacin 400 MG tablet Commonly known as: AVELOX Take 400 mg by mouth daily at 8 pm.   multivitamin tablet Take 1 tablet by mouth daily.   Narcan 4 MG/0.1ML Liqd nasal spray kit Generic drug: naloxone Place 1 spray into the nose once. If no response after 2 minutes  repeat in opposite nostril.   nitroGLYCERIN 0.4 MG SL tablet Commonly known as: NITROSTAT Place 0.4 mg under the tongue every 5 (five) minutes as needed for chest pain.   NON FORMULARY Floradix liquid 1 capful QD   oxycodone 30 MG immediate release tablet Commonly known as: ROXICODONE Take 15 mg by mouth every 4 (four) hours as needed for pain.   Ozempic (0.25 or 0.5 MG/DOSE) 2 MG/1.5ML Sopn Generic drug: Semaglutide(0.25 or 0.5MG /DOS) INJECT 0.375 MLS (0.5 MG TOTAL) SUBCUTANEOUSLY ONCE A WEEK FOR 30 DAYS   Ozempic (1 MG/DOSE) 4 MG/3ML Sopn Generic drug:  Semaglutide (1 MG/DOSE) Inject 1 mg into the skin once a week.   Ubrelvy 100 MG Tabs Generic drug: Ubrogepant as directed.        Allergies:  Allergies  Allergen Reactions   Other Hives, Rash, Other (See Comments) and Itching    [place] yogurt only, gastric by pass  [place] yogurt only   Bactrim [Sulfamethoxazole-Trimethoprim] Nausea And Vomiting   Ciprofloxacin Swelling    Other reaction(s): edema Lip swelling    Vancomycin Nausea And Vomiting and Nausea Only    Other reaction(s): vomiting   Erythromycin Itching    Other reaction(s): Not available diarrhea    Nsaids     Avoids due to gastric bypass   Sulfamethoxazole-Trimethoprim     Other reaction(s): Not available   Amoxicillin Nausea And Vomiting, Nausea Only and Rash    Patient tolerated Zosyn during the whole admission  Has patient had a PCN reaction causing immediate rash, facial/tongue/throat swelling, SOB or lightheadedness with hypotension: No Has patient had a PCN reaction causing severe rash involving mucus membranes or skin necrosis: No Has patient had a PCN reaction that required hospitalization: No Has patient had a PCN reaction occurring within the last 10 years: Yes If all of the above answers are "NO", then may proceed with Cephalosporin use.  Other reaction(s): vomiting Patient tolerated Zosyn during the whole admission  Has patient had a  PCN reaction causing immediate rash, facial/tongue/throat swelling, SOB or lightheadedness with hypotension: No Has patient had a PCN reaction causing severe rash involving mucus membranes or skin necrosis: No Has patient had a PCN reaction that required hospitalization: No Has patient had a PCN reaction occurring within the last 10 years: Yes If all of the above answers are "NO", then may proceed with Cephalosporin use. Patient tolerated Zosyn during the whole admission  Has patient had a PCN reaction causing immediate rash, facial/tongue/throat swelling, SOB or lightheadedness with hypotension: No Has patient had a PCN reaction causing severe rash involving mucus membranes or skin necrosis: No Has patient had a PCN reaction that required hospitalization: No Has patient had a PCN reaction occurring within the last 10 years: Yes If all of the above answers are "NO", then may proceed with Cephalosporin use.    Azithromycin Itching and Rash   Cefotaxime Nausea Only, Nausea And Vomiting and Rash    Other reaction(s): vomiting   Ceftriaxone Nausea And Vomiting   Food Itching and Rash    "[place] yogurt"    Family History: Family History  Problem Relation Age of Onset   Depression Mother    Drug abuse Mother    Early death Mother    Hypertension Mother    Varicose Veins Mother    CVA Mother    Diabetes Father    Early death Father    Hyperlipidemia Father    Heart Problems Father        enlarged geart   Breast cancer Maternal Grandmother        60's   Breast cancer Maternal Aunt    Lung cancer Maternal Grandfather     Social History:  reports that she has never smoked. She has never used smokeless tobacco. She reports that she does not drink alcohol and does not use drugs.   Physical Exam: BP 129/85   Pulse (!) 105   Ht 5\' 5"  (1.651 m)   Wt 155 lb (70.3 kg)   LMP 12/21/2014 (Approximate)   BMI 25.79 kg/m   Constitutional:  Alert and oriented, No  acute distress. HEENT: [place]  AT, moist mucus membranes.  Trachea midline, no masses. Cardiovascular: No clubbing, cyanosis, or edema. Respiratory: Normal respiratory effort, no increased work of breathing. GI: Abdomen is soft, nontender, nondistended, no abdominal masses Skin: No rashes, bruises or suspicious lesions. Neurologic: Grossly intact, no focal deficits, moving all 4 extremities. Psychiatric: Normal mood and affect.   Urinalysis Dipstick negative, microscopy negative; calcium oxalate crystals noted.    Pertinent Imaging: CT was personally reviewed and interpreted.  CT  EXAM: CT ABDOMEN AND PELVIS WITH CONTRAST   TECHNIQUE: Multidetector CT imaging of the abdomen and pelvis was performed using the standard protocol following bolus administration of intravenous contrast.   RADIATION DOSE REDUCTION: This exam was performed according to the departmental dose-optimization program which includes automated exposure control, adjustment of the mA and/or kV according to patient size and/or use of iterative reconstruction technique.   CONTRAST:  OMNIPAQUE IOHEXOL 300 MG/ML  SOLN   COMPARISON:  04/17/2022   FINDINGS: Lower chest: No acute abnormality.  Coronary artery calcifications.   Hepatobiliary: No focal liver abnormality is seen. Status post cholecystectomy. Mild postoperative biliary dilatation.   Pancreas: Unremarkable. No pancreatic ductal dilatation or surrounding inflammatory changes.   Spleen: Normal in size without significant abnormality.   Adrenals/Urinary Tract: Adrenal glands are unremarkable. Kidneys are normal, without renal calculi, solid lesion, or hydronephrosis. Bladder is unremarkable.   Stomach/Bowel: Status post partial sleeve gastrectomy. Distal small bowel resection and reanastomosis. Appendix is not clearly visualized. No evidence of bowel wall thickening, distention, or inflammatory changes. Moderate burden of stool throughout the colon and rectum.    Vascular/Lymphatic: Aortic atherosclerosis. No enlarged abdominal or pelvic lymph nodes.   Reproductive: No mass or other significant abnormality.   Other: No abdominal wall hernia. Evidence of prior abdominoplasty. No ascites.   Musculoskeletal: No acute or significant osseous findings.   IMPRESSION: 1. No acute CT findings of the abdomen or pelvis to explain left lower quadrant abdominal pain. 2. Moderate burden of stool throughout the colon and rectum. 3. Status post partial sleeve gastrectomy, distal small bowel resection, and cholecystectomy. 4. Aortic atherosclerosis advanced for patient age.   Aortic Atherosclerosis (ICD10-I70.0).     Electronically Signed   By: Jearld Lesch M.D.   On: 06/19/2022 11:03   Assessment & Plan:    1. Multidrug resistant bacteriuria PVR today is 0 mL  Based on AUA practice guidelines for lower urinary tract infections in women, malodorous urine is not considered a symptom Renal ultrasound since she was showing calcium oxalate crystals in today's urine; relative recent CT abdomen pelvis was unremarkable.  Has a history of previous enterotomy and bowel obstruction and will schedule cystoscopy to assess for possible colovesicular fistula.  We discussed supplements of D-mannose and cranberry for potential prevention of bacteriuria.  [facility] [address] [place], [place] [address] [phone]

## 2023-03-04 ENCOUNTER — Encounter: Payer: Self-pay | Admitting: Urology

## 2023-03-05 ENCOUNTER — Inpatient Hospital Stay: Payer: 59

## 2023-03-05 ENCOUNTER — Inpatient Hospital Stay: Payer: 59 | Admitting: Oncology

## 2023-03-05 ENCOUNTER — Ambulatory Visit: Payer: PPO | Admitting: Urology

## 2023-03-05 ENCOUNTER — Encounter: Payer: Self-pay | Admitting: Oncology

## 2023-03-06 ENCOUNTER — Ambulatory Visit
Admission: RE | Admit: 2023-03-06 | Discharge: 2023-03-06 | Disposition: A | Discharge status: Home / Self Care | Payer: 59 | Source: Ambulatory Visit | Attending: Urology | Admitting: Urology

## 2023-03-06 ENCOUNTER — Other Ambulatory Visit: Payer: Self-pay | Admitting: Neurosurgery

## 2023-03-06 DIAGNOSIS — N390 Urinary tract infection, site not specified: Secondary | ICD-10-CM | POA: Insufficient documentation

## 2023-03-31 ENCOUNTER — Encounter: Payer: Self-pay | Admitting: *Deleted

## 2023-03-31 ENCOUNTER — Other Ambulatory Visit: Payer: Self-pay

## 2023-03-31 DIAGNOSIS — U071 COVID-19: Secondary | ICD-10-CM | POA: Insufficient documentation

## 2023-03-31 DIAGNOSIS — R509 Fever, unspecified: Secondary | ICD-10-CM | POA: Diagnosis present

## 2023-03-31 LAB — SARS CORONAVIRUS 2 BY RT PCR: SARS Coronavirus 2 by RT PCR: POSITIVE — AB

## 2023-03-31 MED ORDER — ACETAMINOPHEN 325 MG PO TABS
650.0000 mg | ORAL_TABLET | Freq: Once | ORAL | Status: AC | PRN
  Administered 2023-03-31: 650 mg via ORAL
  Filled 2023-03-31: qty 2

## 2023-03-31 NOTE — ED Triage Notes (Signed)
Fever, body aches, headache, non productive cough. Just got off cruise ship today.

## 2023-04-01 ENCOUNTER — Emergency Department
Admission: EM | Admit: 2023-04-01 | Discharge: 2023-04-01 | Disposition: A | Discharge status: Home / Self Care | Payer: 59 | Attending: Emergency Medicine | Admitting: Emergency Medicine

## 2023-04-01 ENCOUNTER — Emergency Department: Payer: 59

## 2023-04-01 DIAGNOSIS — U071 COVID-19: Secondary | ICD-10-CM

## 2023-04-01 MED ORDER — HYDROCOD POLI-CHLORPHE POLI ER 10-8 MG/5ML PO SUER: 5.0000 mL | Freq: Two times a day (BID) | ORAL | 0 refills | Status: DC | PRN

## 2023-04-01 MED ORDER — HYDROCOD POLI-CHLORPHE POLI ER 10-8 MG/5ML PO SUER
5.0000 mL | Freq: Once | ORAL | Status: AC
  Administered 2023-04-01: 5 mL via ORAL
  Filled 2023-04-01: qty 5

## 2023-04-01 MED ORDER — ACETAMINOPHEN 325 MG PO TABS
325.0000 mg | ORAL_TABLET | Freq: Once | ORAL | Status: AC
  Administered 2023-04-01: 325 mg via ORAL
  Filled 2023-04-01: qty 1

## 2023-04-01 NOTE — Discharge Instructions (Signed)
As we discussed, although you have tested positive for COVID-19 (coronavirus), you do not need to be hospitalized at this time.  Read through all the included information including the recommendations from the CDC.  You should have as minimal contact as possible with anyone else including close family as per the [facility] paperwork guidelines listed below. Follow-up with your doctor by phone or online as needed and return immediately to the emergency department or call 911 only if you develop new or worsening symptoms that concern you.  If you were prescribed any medications, please use them as instructed.  If you were given information for the COVID-19 antibody infusion treatment clinic, please call them and leave your contact information.  This can be a very effective and important treatment method, and you should discuss with them if you qualify for treatment, though unfortunately the treatment is quite limited in supply, so please understand there will be times when this treatment is unavailable.  You can find up-to-date information about COVID-19 in [place] by calling the [facility] [employer] Helpline: [phone]. You may also call 2-1-1, or [phone], or additional resources.  You can also find information online at PureLoser.gl, or on the Center for Disease Control (CDC) website at http://bradshaw.com/.

## 2023-04-01 NOTE — ED Provider Notes (Signed)
[facility] Provider Note    Event Date/Time   First MD Initiated Contact with Patient 04/01/23 [phone]     (approximate)   History   Fever   HPI Marissa [name] is a 47 y.o. female who reports she has had COVID twice in the past but never been immunized.  She presents for evaluation of acute onset symptoms today of fever, body aches, cough, and congestion.  She just got back from a [place] cruise.  No chest pain specifically.  Also has a headache.  No difficulty breathing, just the cough.     Physical Exam   Triage Vital Signs: ED Triage Vitals  Encounter Vitals Group     BP 03/31/23 2259 (!) 175/104     Systolic BP Percentile --      Diastolic BP Percentile --      Pulse Rate 03/31/23 2259 (!) 117     Resp 03/31/23 2259 16     Temp 03/31/23 2259 (!) 100.6 F (38.1 C)     Temp Source 03/31/23 2259 Oral     SpO2 03/31/23 2259 98 %     Weight --      Height --      Head Circumference --      Peak Flow --      Pain Score 03/31/23 2256 8     Pain Loc --      Pain Education --      Exclude from Growth Chart --     Most recent vital signs: Vitals:   03/31/23 2259  BP: (!) 175/104  Pulse: (!) 117  Resp: 16  Temp: (!) 100.6 F (38.1 C)  SpO2: 98%    General: Awake, no distress.  Appears ill from a viral illness but nontoxic. CV:  Good peripheral perfusion.  Tachycardia, regular rhythm. Resp:  Normal effort. Speaking easily and comfortably, no accessory muscle usage nor intercostal retractions.  Lungs are clear to auscultation bilaterally.  No cough observed during my interview. Abd:  No distention.    ED Results / Procedures / Treatments   Labs (all labs ordered are listed, but only abnormal results are displayed) Labs Reviewed  SARS CORONAVIRUS 2 BY RT PCR - Abnormal; Notable for the following components:      Result Value   SARS Coronavirus 2 by RT PCR POSITIVE (*)    All other components within normal limits     RADIOLOGY I  viewed and interpreted the patient's two-view chest x-ray and I see no evidence of pneumonia.  I also read the radiologist's report, which confirmed no acute findings.   PROCEDURES:  Critical Care performed: No  Procedures    IMPRESSION / MDM / ASSESSMENT AND PLAN / ED COURSE  I reviewed the triage vital signs and the nursing notes.                              Differential diagnosis includes, but is not limited to, viral illness, community-acquired pneumonia.  Patient's presentation is most consistent with acute presentation with potential threat to life or bodily function.  Labs/studies ordered: COVID-19 PCR, two-view chest x-ray  Interventions/Medications given:  Medications  acetaminophen (TYLENOL) tablet 325 mg (has no administration in time range)  acetaminophen (TYLENOL) tablet 650 mg (650 mg Oral Given 03/31/23 2302)    (Note:  hospital course my include additional interventions and/or labs/studies not listed above.)   Patient appears uncomfortable due  to viral illness but is not acutely or severely ill.  Patient is mildly febrile and tachycardic but able to tolerate oral intake.  Reassuring pulmonary exam.  I talked with her about the positive COVID-19 PCR.  We talked about Paxlovid but she declines treatment which I think is reasonable and appropriate given the limited efficacy.  I encouraged oral hydration and use of acetaminophen.  I recommended she call in 1 day to her PCPs office to schedule follow-up appointment.  She understands and agrees with the plan.         FINAL CLINICAL IMPRESSION(S) / ED DIAGNOSES   Final diagnoses:  COVID-19     Rx / DC Orders   ED Discharge Orders     None        Note:  This document was prepared using Dragon voice recognition software and may include unintentional dictation errors.   Loleta Rose, MD 04/01/23 [phone]

## 2023-04-11 ENCOUNTER — Other Ambulatory Visit: Payer: 59 | Admitting: Urology

## 2023-04-13 ENCOUNTER — Telehealth: Payer: Self-pay | Admitting: Urology

## 2023-04-13 NOTE — Telephone Encounter (Signed)
Pt called office asking for an appt to have a u/a cath done.  She has appt 9/18 for cysto w/[facility].  She has back surgery scheduled for October and her pcp recommended we do an in and out cath, due to her last infection showing 2 different types of bacteria.

## 2023-04-13 NOTE — Telephone Encounter (Signed)
Patient feels like she is still has UTI (see phone note with PCP in the chart), PCP requested Cath UA sample. Patient advised and scheduled with Sam on 04/17/23

## 2023-04-17 ENCOUNTER — Ambulatory Visit (INDEPENDENT_AMBULATORY_CARE_PROVIDER_SITE_OTHER): Payer: 59 | Admitting: Physician Assistant

## 2023-04-17 ENCOUNTER — Encounter: Payer: Self-pay | Admitting: Physician Assistant

## 2023-04-17 VITALS — BP 123/86 | HR 111 | Ht 64.0 in | Wt 154.0 lb

## 2023-04-17 DIAGNOSIS — Z87440 Personal history of urinary (tract) infections: Secondary | ICD-10-CM | POA: Diagnosis not present

## 2023-04-17 DIAGNOSIS — R110 Nausea: Secondary | ICD-10-CM | POA: Diagnosis not present

## 2023-04-17 DIAGNOSIS — R82998 Other abnormal findings in urine: Secondary | ICD-10-CM | POA: Diagnosis not present

## 2023-04-17 DIAGNOSIS — R1030 Lower abdominal pain, unspecified: Secondary | ICD-10-CM

## 2023-04-17 DIAGNOSIS — N390 Urinary tract infection, site not specified: Secondary | ICD-10-CM

## 2023-04-17 LAB — MICROSCOPIC EXAMINATION

## 2023-04-17 LAB — URINALYSIS, COMPLETE
Bilirubin, UA: NEGATIVE
Glucose, UA: NEGATIVE
Ketones, UA: NEGATIVE
Leukocytes,UA: NEGATIVE
Nitrite, UA: POSITIVE — AB
Protein,UA: NEGATIVE
RBC, UA: NEGATIVE
Specific Gravity, UA: >1.03 — ABNORMAL HIGH (ref 1.005–1.030)
Urobilinogen, Ur: 0.2 mg/dL (ref 0.2–1.0)
pH, UA: 5.5 (ref 5.0–7.5)

## 2023-04-17 MED ORDER — FLUCONAZOLE 150 MG PO TABS
150.0000 mg | ORAL_TABLET | Freq: Once | ORAL | 0 refills | Status: AC
Start: 2023-04-17 — End: 2023-04-17

## 2023-04-17 MED ORDER — DOXYCYCLINE HYCLATE 100 MG PO CAPS: 100.0000 mg | ORAL_CAPSULE | Freq: Two times a day (BID) | ORAL | 0 refills | Status: DC

## 2023-04-17 MED ORDER — ONDANSETRON 4 MG PO TBDP
4.0000 mg | ORAL_TABLET | Freq: Three times a day (TID) | ORAL | 0 refills | Status: AC | PRN
Start: 2023-04-17 — End: ?

## 2023-04-17 NOTE — Progress Notes (Signed)
04/17/2023 5:05 PM   Artis Flock Clearance Coots 09/11/1975 [id number]  CC: Chief Complaint  Patient presents with   Recurrent UTI   HPI: Marissa [name] is a 47 y.o. female with PMH malodorous urine and bacteriuria with possible UTIs scheduled for cystoscopy with Dr. Lonna Cobb later this month who presents today for cath UA per PCP.   Today she reports an approximate 3-week history of lower abdominal pain and nausea as well as discolored and malodorous urine.  She admits to some chronic back pain but states lately she has been having some pain in her left flank that is different than her usual.  She took a dose of fosfomycin 2.5 weeks ago but this did not help.  She has multiple antibiotic allergies and previously required IV therapy.  She thinks her UTIs may be associated with sexual activity with a recent partner.  She has noticed increasing UTI symptoms associated with sexual contact with this partner in particular.  In-office catheterized UA today positive for nitrites; urine microscopy with 6-10 WBCs/HPF, calcium oxalate crystals, and many bacteria.  PMH: Past Medical History:  Diagnosis Date   Allergy    Anemia    Anxiety    Arthritis    Back pain, chronic    Cardiomyopathy, dilated, nonischemic (HCC)    CHF (congestive heart failure) (HCC)    Cholelithiasis    Coronary artery disease    Depression    Diabetes mellitus without complication (HCC)    fasting cbg 50-140s   Dysrhythmia    tachycardia   GERD (gastroesophageal reflux disease)    otc meds   Headache(784.0)    Hypercholesteremia    Hyperlipemia    Hypertension    IDA (iron deficiency anemia) 09/29/2019   Insulin pump in place    pt had insulin pump but it is now removed (02-18-16)   MI, old    2006   Migraines    once/month maybe - can last up to two weeks   Myocardial infarction [facility]) 2006   "due to medication"; no evidence of ischemia or infarction by nuclear stress test '11   Neck pain, chronic     Neuropathy    legs   Pneumonia 2015   [facility]   Prolonged QT interval syndrome    Renal insufficiency    Restless leg syndrome, controlled    Sepsis (HCC) 2016   secondary to surgery   Sleep apnea    does not use cpap since losing alot of weight   Vertigo    nop episodes 2020   Vision loss    due to diabetes   Wears contact lenses    Wears dentures    partial lower    Surgical History: Past Surgical History:  Procedure Laterality Date   ACHILLES TENDON SURGERY Right 04/22/2019   Procedure: [facility] PROCEDURE WITH SUTURE ANCHOR;  Surgeon: Rosetta Posner, DPM;  Location: [facility];  Service: Podiatry;  Laterality: Right;   APPENDECTOMY N/A 03/08/2018   Procedure: APPENDECTOMY;  Surgeon: Christeen Douglas, MD;  Location: [facility] ORS;  Service: Gynecology;  Laterality: N/A;  By Dr. Ara Kussmaul SURGERY  2011   Lumbar    BOWEL RESECTION N/A 03/08/2018   Procedure: SMALL BOWEL RESECTION;  Surgeon: Christeen Douglas, MD;  Location: [facility] ORS;  Service: Gynecology;  Laterality: N/A;   CARDIAC CATHETERIZATION  07/18/2004   50-60% mid LAD, minor luminal irregularities RCA, normal LM and CX, EF 50-55% [facility])   carpel tunnel Bilateral  CERVICAL FUSION  2006   CHOLECYSTECTOMY N/A 04/08/2015   Procedure: LAPAROSCOPIC CHOLECYSTECTOMY WITH INTRAOPERATIVE CHOLANGIOGRAM;  Surgeon: Lattie Haw, MD;  Location: [facility] ORS;  Service: General;  Laterality: N/A;   COLON RESECTION N/A 03/16/2018   Procedure: COLON RESECTION;  Surgeon: Lattie Haw, MD;  Location: [facility] ORS;  Service: General;  Laterality: N/A;   COLONOSCOPY WITH PROPOFOL N/A 10/22/2019   Procedure: COLONOSCOPY WITH PROPOFOL;  Surgeon: Toledo, Boykin Nearing, MD;  Location: [facility] ENDOSCOPY;  Service: Gastroenterology;  Laterality: N/A;  [facility] DID RAPID TEST; COPY ON CHART   ESOPHAGOGASTRODUODENOSCOPY (EGD) WITH PROPOFOL N/A 10/22/2019   Procedure: ESOPHAGOGASTRODUODENOSCOPY (EGD) WITH PROPOFOL;  Surgeon: Toledo,  Boykin Nearing, MD;  Location: [facility] ENDOSCOPY;  Service: Gastroenterology;  Laterality: N/A;   HAMMER TOE SURGERY Right 04/22/2019   Procedure: HAMMER TOE CORRECTION X 4;  Surgeon: Rosetta Posner, DPM;  Location: [facility];  Service: Podiatry;  Laterality: Right;  Diabetic - insulin and oral meds   LAPAROSCOPIC GASTRECTOMY  05/31/2017   [facility], Dr. Smitty Cords   LUMBAR LAMINECTOMY/DECOMPRESSION MICRODISCECTOMY Right 05/07/2013   Procedure: Right Lumbar one-two laminectomy;  Surgeon: Mariam Dollar, MD;  Location: [facility];  Service: Neurosurgery;  Laterality: Right;   LUMBAR LAMINECTOMY/DECOMPRESSION MICRODISCECTOMY Left 10/29/2013   Procedure: Left Lumbar five-Sacral one Laminectomy;  Surgeon: Mariam Dollar, MD;  Location: [facility];  Service: Neurosurgery;  Laterality: Left;   LUMBAR WOUND DEBRIDEMENT N/A 01/25/2014   Procedure: LUMBAR WOUND DEBRIDEMENT;  Surgeon: Temple Pacini, MD;  Location: [facility];  Service: Neurosurgery;  Laterality: N/A;   LUMBAR WOUND DEBRIDEMENT N/A 02/25/2014   Procedure: LUMBAR WOUND DEBRIDEMENT;  Surgeon: Mariam Dollar, MD;  Location: [facility];  Service: Neurosurgery;  Laterality: N/A;   LYSIS OF ADHESION N/A 03/08/2018   Procedure: LYSIS OF ADHESION;  Surgeon: Christeen Douglas, MD;  Location: [facility] ORS;  Service: Gynecology;  Laterality: N/A;   SALPINGOOPHORECTOMY Bilateral    1 1997. 2nd 2001   SHOULDER ARTHROSCOPY WITH BICEPSTENOTOMY Left 03/24/2016   Procedure: shoulder arthroscopy with biceps TENOTOMY, removal loose body, limited synovectomy;  Surgeon: Erin Sons, MD;  Location: [facility] ORS;  Service: Orthopedics;  Laterality: Left;   shoulder sugery     7/17 rotator cuff   SMALL BOWEL REPAIR N/A 03/08/2018   Procedure: SMALL BOWEL REPAIR;  Surgeon: Christeen Douglas, MD;  Location: [facility] OR[facility];  Service: Gynecology;  Laterality: N/A;    Home Medications:  Allergies as of 04/17/2023       Reactions   Other Hives, Rash, Other (See Comments), Itching   Greek  yogurt only, gastric by pass  [place] yogurt only   Bactrim [sulfamethoxazole-trimethoprim] Nausea And Vomiting   Ciprofloxacin Swelling   Other reaction(s): edema Lip swelling   Vancomycin Nausea And Vomiting, Nausea Only   Other reaction(s): vomiting   Erythromycin Itching   Other reaction(s): Not available diarrhea   Nsaids    Avoids due to gastric bypass   Sulfamethoxazole-trimethoprim    Other reaction(s): Not available   Amoxicillin Nausea And Vomiting, Nausea Only, Rash   Patient tolerated Zosyn during the whole admission Has patient had a PCN reaction causing immediate rash, facial/tongue/throat swelling, SOB or lightheadedness with hypotension: No Has patient had a PCN reaction causing severe rash involving mucus membranes or skin necrosis: No Has patient had a PCN reaction that required hospitalization: No Has patient had a PCN reaction occurring within the last 10 years: Yes If all of the above answers are "NO",  then may proceed with Cephalosporin use. Other reaction(s): vomiting Patient tolerated Zosyn during the whole admission Has patient had a PCN reaction causing immediate rash, facial/tongue/throat swelling, SOB or lightheadedness with hypotension: No Has patient had a PCN reaction causing severe rash involving mucus membranes or skin necrosis: No Has patient had a PCN reaction that required hospitalization: No Has patient had a PCN reaction occurring within the last 10 years: Yes If all of the above answers are "NO", then may proceed with Cephalosporin use. Patient tolerated Zosyn during the whole admission Has patient had a PCN reaction causing immediate rash, facial/tongue/throat swelling, SOB or lightheadedness with hypotension: No Has patient had a PCN reaction causing severe rash involving mucus membranes or skin necrosis: No Has patient had a PCN reaction that required hospitalization: No Has patient had a PCN reaction occurring within the last 10 years: Yes If  all of the above answers are "NO", then may proceed with Cephalosporin use.   Azithromycin Itching, Rash   Cefotaxime Nausea Only, Nausea And Vomiting, Rash   Other reaction(s): vomiting   Ceftriaxone Nausea And Vomiting   Food Itching, Rash   "Greek yogurt"        Medication List        Accurate as of April 17, 2023  5:05 PM. If you have any questions, ask your nurse or doctor.          acetaminophen 500 MG tablet Commonly known as: TYLENOL Take 1,000 mg by mouth 2 (two) times daily as needed for moderate pain or headache.   Ajovy 225 MG/1.5ML Soaj Generic drug: Fremanezumab-vfrm Inject into the skin as directed.   BIOTIN PO Take 1 tablet by mouth daily.   chlorpheniramine-HYDROcodone 10-8 MG/5ML Commonly known as: TUSSIONEX Take 5 mLs by mouth every 12 (twelve) hours as needed for cough.   Climara Pro 0.045-0.015 MG/DAY Generic drug: estradiol-levonorgestrel Place 1 patch onto the skin every 7 (seven) days.   cyclobenzaprine 10 MG tablet Commonly known as: FLEXERIL Take 1 tablet (10 mg total) by mouth 3 (three) times daily as needed.   diphenhydrAMINE 25 mg capsule Commonly known as: BENADRYL Take 25 mg by mouth every 6 (six) hours as needed.   doxycycline 100 MG capsule Commonly known as: VIBRAMYCIN Take 1 capsule (100 mg total) by mouth 2 (two) times daily for 7 days. Started by: Carman Ching   fluconazole 150 MG tablet Commonly known as: DIFLUCAN Take 1 tablet (150 mg total) by mouth once for 1 dose. Started by: Carman Ching   gabapentin 400 MG capsule Commonly known as: NEURONTIN Take 400 mg by mouth 3 (three) times daily.   moxifloxacin 400 MG tablet Commonly known as: AVELOX Take 400 mg by mouth daily at 8 pm.   multivitamin tablet Take 1 tablet by mouth daily.   Narcan 4 MG/0.1ML Liqd nasal spray kit Generic drug: naloxone Place 1 spray into the nose once. If no response after 2 minutes repeat in opposite nostril.    nitroGLYCERIN 0.4 MG SL tablet Commonly known as: NITROSTAT Place 0.4 mg under the tongue every 5 (five) minutes as needed for chest pain.   NON FORMULARY Floradix liquid 1 capful QD   ondansetron 4 MG disintegrating tablet Commonly known as: ZOFRAN-ODT Take 1 tablet (4 mg total) by mouth every 8 (eight) hours as needed for nausea or vomiting. Started by: Carman Ching   oxycodone 30 MG immediate release tablet Commonly known as: ROXICODONE Take 15 mg by mouth every 4 (four) hours as  needed for pain.   Ozempic (0.25 or 0.5 MG/DOSE) 2 MG/1.5ML Sopn Generic drug: Semaglutide(0.25 or 0.5MG /DOS)   Ozempic (1 MG/DOSE) 4 MG/3ML Sopn Generic drug: Semaglutide (1 MG/DOSE) Inject 1 mg into the skin once a week.   rosuvastatin 20 MG tablet Commonly known as: CRESTOR Take by mouth.   Ubrelvy 100 MG Tabs Generic drug: Ubrogepant as directed.        Allergies:  Allergies  Allergen Reactions   Other Hives, Rash, Other (See Comments) and Itching    [place] yogurt only, gastric by pass  [place] yogurt only   Bactrim [Sulfamethoxazole-Trimethoprim] Nausea And Vomiting   Ciprofloxacin Swelling    Other reaction(s): edema Lip swelling    Vancomycin Nausea And Vomiting and Nausea Only    Other reaction(s): vomiting   Erythromycin Itching    Other reaction(s): Not available diarrhea    Nsaids     Avoids due to gastric bypass   Sulfamethoxazole-Trimethoprim     Other reaction(s): Not available   Amoxicillin Nausea And Vomiting, Nausea Only and Rash    Patient tolerated Zosyn during the whole admission  Has patient had a PCN reaction causing immediate rash, facial/tongue/throat swelling, SOB or lightheadedness with hypotension: No Has patient had a PCN reaction causing severe rash involving mucus membranes or skin necrosis: No Has patient had a PCN reaction that required hospitalization: No Has patient had a PCN reaction occurring within the last 10 years: Yes If all of  the above answers are "NO", then may proceed with Cephalosporin use.  Other reaction(s): vomiting Patient tolerated Zosyn during the whole admission  Has patient had a PCN reaction causing immediate rash, facial/tongue/throat swelling, SOB or lightheadedness with hypotension: No Has patient had a PCN reaction causing severe rash involving mucus membranes or skin necrosis: No Has patient had a PCN reaction that required hospitalization: No Has patient had a PCN reaction occurring within the last 10 years: Yes If all of the above answers are "NO", then may proceed with Cephalosporin use. Patient tolerated Zosyn during the whole admission  Has patient had a PCN reaction causing immediate rash, facial/tongue/throat swelling, SOB or lightheadedness with hypotension: No Has patient had a PCN reaction causing severe rash involving mucus membranes or skin necrosis: No Has patient had a PCN reaction that required hospitalization: No Has patient had a PCN reaction occurring within the last 10 years: Yes If all of the above answers are "NO", then may proceed with Cephalosporin use.    Azithromycin Itching and Rash   Cefotaxime Nausea Only, Nausea And Vomiting and Rash    Other reaction(s): vomiting   Ceftriaxone Nausea And Vomiting   Food Itching and Rash    "[place] yogurt"    Family History: Family History  Problem Relation Age of Onset   Depression Mother    Drug abuse Mother    Early death Mother    Hypertension Mother    Varicose Veins Mother    CVA Mother    Diabetes Father    Early death Father    Hyperlipidemia Father    Heart Problems Father        enlarged geart   Breast cancer Maternal Grandmother        60's   Breast cancer Maternal Aunt    Lung cancer Maternal Grandfather     Social History:   reports that she has never smoked. She has never used smokeless tobacco. She reports that she does not drink alcohol and does not use drugs.  Physical Exam: BP 123/86   Pulse  (!) 111   Ht 5\' 4"  (1.626 m)   Wt 154 lb (69.9 kg)   LMP 12/21/2014 (Approximate)   BMI 26.43 kg/m   Constitutional:  Alert and oriented, no acute distress, nontoxic appearing HEENT: [place], AT Cardiovascular: No clubbing, cyanosis, or edema Respiratory: Normal respiratory effort, no increased work of breathing Skin: No rashes, bruises or suspicious lesions Neurologic: Grossly intact, no focal deficits, moving all 4 extremities Psychiatric: Normal mood and affect  Laboratory Data: Results for orders placed or performed in visit on 04/17/23  Microscopic Examination   Urine  Result Value Ref Range   WBC, UA 6-10 (A) 0 - 5 /hpf   RBC, Urine 0-2 0 - 2 /hpf   Epithelial Cells (non renal) 0-10 0 - 10 /hpf   Crystals Present (A) N/A   Crystal Type Calcium Oxalate N/A   Bacteria, UA Many (A) None seen/Few  Urinalysis, Complete  Result Value Ref Range   Specific Gravity, UA >1.030 (H) 1.005 - 1.030   pH, UA 5.5 5.0 - 7.5   Color, UA Amber (A) Yellow   Appearance Ur Cloudy (A) Clear   Leukocytes,UA Negative Negative   Protein,UA Negative Negative/Trace   Glucose, UA Negative Negative   Ketones, UA Negative Negative   RBC, UA Negative Negative   Bilirubin, UA Negative Negative   Urobilinogen, Ur 0.2 0.2 - 1.0 mg/dL   Nitrite, UA Positive (A) Negative   Microscopic Examination See below:    Assessment & Plan:   1. Urinary tract infection without hematuria, site unspecified UA today again notable for bacteriuria and calcium oxalate crystals, though rather minimal pyuria.  Differential includes acute UTI versus asymptomatic bacteriuria.  Will go ahead and start empiric doxycycline and send for culture for further evaluation.  She requested a dose of Diflucan in case she develops a secondary yeast infection.  I am also sending in Zofran, as she often has nausea and vomiting with antibiotics.  We discussed signs of allergic reaction to antibiotics including rash, swelling, and shortness of  breath and I encouraged her to stop the antibiotic immediately if she develops these.    Agree with cystoscopy with Dr. Lonna Cobb.  In the future, our prophylactic antibiotic options will be extremely limited due to her allergies, but could consider putting her on Hiprex for a recurrent UTI picture. - Urinalysis, Complete - CULTURE, URINE COMPREHENSIVE - doxycycline (VIBRAMYCIN) 100 MG capsule; Take 1 capsule (100 mg total) by mouth 2 (two) times daily for 7 days.  Dispense: 14 capsule; Refill: 0 - fluconazole (DIFLUCAN) 150 MG tablet; Take 1 tablet (150 mg total) by mouth once for 1 dose.  Dispense: 1 tablet; Refill: 0 - ondansetron (ZOFRAN-ODT) 4 MG disintegrating tablet; Take 1 tablet (4 mg total) by mouth every 8 (eight) hours as needed for nausea or vomiting.  Dispense: 20 tablet; Refill: 0   Return if symptoms worsen or fail to improve.  Carman Ching, PA-C  [facility] [place] [address] [place], [place] [address] [phone]

## 2023-04-19 ENCOUNTER — Telehealth: Payer: Self-pay | Admitting: Physician Assistant

## 2023-04-19 LAB — CULTURE, URINE COMPREHENSIVE

## 2023-04-19 NOTE — Telephone Encounter (Signed)
Patient called to report that doxycycline that was prescribed caused diarrhea, and she stopped taking it. She asked if there is something else that can be prescribed in place of this? Please advise patient.

## 2023-04-23 ENCOUNTER — Ambulatory Visit (INDEPENDENT_AMBULATORY_CARE_PROVIDER_SITE_OTHER): Payer: 59 | Admitting: Physician Assistant

## 2023-04-23 VITALS — BP 151/98 | HR 94 | Ht 64.0 in | Wt 154.0 lb

## 2023-04-23 DIAGNOSIS — N390 Urinary tract infection, site not specified: Secondary | ICD-10-CM | POA: Diagnosis not present

## 2023-04-23 MED ORDER — GENTAMICIN SULFATE 40 MG/ML IJ SOLN
80.0000 mg | Freq: Once | INTRAMUSCULAR | Status: AC
Start: 2023-04-23 — End: 2023-04-23
  Administered 2023-04-23: 80 mg

## 2023-04-23 NOTE — Progress Notes (Signed)
Bladder Instillation  Due to rUTI patient is present today for a Bladder Instillation of gentamicin. Patient was cleaned and prepped in a sterile fashion with betadine.  A 14FR catheter was inserted, urine return was noted , urine was yellow in color.  2ml Gentamicin reconstituted in 50 ml sterile saline was instilled into the bladder. The catheter was then removed. Patient tolerated well, no complications were noted.  Performed by: Carman Ching, PA-C and Domingo Cocking, CMA  Additional notes: She reports persistent bladder pressure and left low back pain, unsure if related. Will proceed with gentamicin this week to sterilize the urine in advance of cystoscopy with Dr. Lonna Cobb next week.  She remained in clinic for 30 minutes for monitoring of allergic reaction; none noted.  Follow up: Tomorrow for gentamicin instillation

## 2023-04-24 ENCOUNTER — Ambulatory Visit (INDEPENDENT_AMBULATORY_CARE_PROVIDER_SITE_OTHER): Payer: 59 | Admitting: Physician Assistant

## 2023-04-24 VITALS — BP 83/58 | HR 92 | Temp 98.0°F | Ht 64.0 in | Wt 154.0 lb

## 2023-04-24 DIAGNOSIS — N390 Urinary tract infection, site not specified: Secondary | ICD-10-CM | POA: Diagnosis not present

## 2023-04-24 MED ORDER — GENTAMICIN SULFATE 40 MG/ML IJ SOLN
80.0000 mg | Freq: Once | INTRAMUSCULAR | Status: AC
Start: 2023-04-24 — End: 2023-04-24
  Administered 2023-04-24: 80 mg

## 2023-04-24 NOTE — Progress Notes (Signed)
Bladder Instillation  Due to rUTI patient is present today for a Bladder Instillation of gentamicin. Patient was cleaned and prepped in a sterile fashion with betadine.  A 14FR catheter was inserted, urine return was noted 25ml, urine was yellow in color.  2ml Gentamicin reconstituted in 50 ml sterile saline was instilled into the bladder. The catheter was then removed. Patient tolerated well, no complications were noted.  Performed by: Carman Ching, PA-C  Additional notes: No rashes, itching, or swelling after treatment yesterday. No change in bladder discomfort. She has noticed some hypotension but no fevers or change in PO intake.  Follow up: Tomorrow for gentamicin instillation #3

## 2023-04-25 ENCOUNTER — Ambulatory Visit (INDEPENDENT_AMBULATORY_CARE_PROVIDER_SITE_OTHER): Payer: 59 | Admitting: Physician Assistant

## 2023-04-25 ENCOUNTER — Encounter: Payer: Self-pay | Admitting: Physician Assistant

## 2023-04-25 VITALS — BP 112/78 | HR 109 | Wt 156.0 lb

## 2023-04-25 DIAGNOSIS — N390 Urinary tract infection, site not specified: Secondary | ICD-10-CM | POA: Diagnosis not present

## 2023-04-25 MED ORDER — GENTAMICIN SULFATE 40 MG/ML IJ SOLN
80.0000 mg | Freq: Once | INTRAMUSCULAR | Status: AC
Start: 2023-04-25 — End: 2023-04-25
  Administered 2023-04-25: 80 mg

## 2023-04-25 MED ORDER — GENTAMICIN SULFATE 40 MG/ML IJ SOLN
80.0000 mg | Freq: Once | INTRAMUSCULAR | Status: DC
Start: 2023-04-25 — End: 2023-04-25

## 2023-04-25 NOTE — Progress Notes (Signed)
Bladder Instillation  Due to rUTI patient is present today for a Bladder Instillation of gentamicin. Patient was cleaned and prepped in a sterile fashion with betadine.  A 14FR catheter was inserted, urine return was noted , urine was yellow in color.  2ml Gentamicin reconstituted in 50 ml sterile saline was instilled into the bladder. The catheter was then removed. Patient tolerated well, no complications were noted.  Performed by: Carman Ching, PA-C  Additional notes: No rashes, itching, or swelling after treatment yesterday. No change in bladder discomfort. Hypotension has resolved.  Follow up: Tomorrow for gentamicin instillation #4

## 2023-04-26 ENCOUNTER — Ambulatory Visit (INDEPENDENT_AMBULATORY_CARE_PROVIDER_SITE_OTHER): Payer: 59 | Admitting: Physician Assistant

## 2023-04-26 ENCOUNTER — Encounter: Payer: Self-pay | Admitting: Physician Assistant

## 2023-04-26 VITALS — Ht 64.0 in | Wt 156.0 lb

## 2023-04-26 DIAGNOSIS — N390 Urinary tract infection, site not specified: Secondary | ICD-10-CM

## 2023-04-26 MED ORDER — GENTAMICIN SULFATE 40 MG/ML IJ SOLN
80.0000 mg | Freq: Once | INTRAMUSCULAR | Status: AC
Start: 2023-04-26 — End: 2023-04-26
  Administered 2023-04-26: 80 mg

## 2023-04-26 NOTE — Progress Notes (Signed)
Bladder Instillation  Due to rUTI patient is present today for a Bladder Instillation of gentamicin. Patient was cleaned and prepped in a sterile fashion with betadine.  A 14FR catheter was inserted, urine return was noted , urine was yellow in color.  2ml Gentamicin reconstituted in 50 ml sterile saline was instilled into the bladder. The catheter was then removed. Patient tolerated well, no complications were noted.  Performed by: Carman Ching, PA-C  Additional notes: Malodorous urine, bladder discomfort are resolving.  Follow up: Tomorrow for gentamicin instillation #5

## 2023-04-27 ENCOUNTER — Ambulatory Visit (INDEPENDENT_AMBULATORY_CARE_PROVIDER_SITE_OTHER): Payer: 59 | Admitting: Physician Assistant

## 2023-04-27 DIAGNOSIS — R8271 Bacteriuria: Secondary | ICD-10-CM

## 2023-04-27 MED ORDER — GENTAMICIN SULFATE 40 MG/ML IJ SOLN
80.0000 mg | Freq: Once | INTRAMUSCULAR | Status: AC
Start: 2023-04-27 — End: 2023-04-27
  Administered 2023-04-27: 80 mg via INTRAMUSCULAR

## 2023-04-27 NOTE — Progress Notes (Addendum)
Bladder Instillation  Due to UTI patient is present today for a Bladder Instillation of Gentamicin. Patient was cleaned and prepped in a sterile fashion with betadine and lidocaine 2% jelly was instilled into the urethra.  A 14 FR catheter was inserted, urine return was noted 105 ml, urine was yellow in color.  52 ml was instilled into the bladder. The catheter was then removed. Patient tolerated well, no complications were noted.  Performed by: Alvina Chou   Follow up/ Additional notes:  cysto next week

## 2023-05-02 ENCOUNTER — Ambulatory Visit (INDEPENDENT_AMBULATORY_CARE_PROVIDER_SITE_OTHER): Payer: 59 | Admitting: Urology

## 2023-05-02 ENCOUNTER — Encounter: Payer: Self-pay | Admitting: Urology

## 2023-05-02 VITALS — BP 130/80 | HR 74 | Ht 68.0 in | Wt 156.0 lb

## 2023-05-02 DIAGNOSIS — R8271 Bacteriuria: Secondary | ICD-10-CM

## 2023-05-02 LAB — URINALYSIS, COMPLETE
Bilirubin, UA: NEGATIVE
Leukocytes,UA: NEGATIVE
Nitrite, UA: NEGATIVE
Protein,UA: NEGATIVE
RBC, UA: NEGATIVE
Specific Gravity, UA: 1.02 (ref 1.005–1.030)
Urobilinogen, Ur: 0.2 mg/dL (ref 0.2–1.0)
pH, UA: 7.5 (ref 5.0–7.5)

## 2023-05-02 LAB — MICROSCOPIC EXAMINATION: Epithelial Cells (non renal): >10 /HPF — AB (ref 0–10)

## 2023-05-02 NOTE — Progress Notes (Unsigned)
   05/02/23  CC:  Chief Complaint  Patient presents with   Cysto    HPI: Refer to my prior note 03/02/2023.  She saw Hilton Sinclair 04/17/2023 and was treated with doxycycline for UTI however could not tolerate secondary to diarrhea.  She subsequently underwent bladder installations of gentamicin which she completed 04/27/2023 and feels she received benefit.  UA today shows no WBCs/RBCs on microscopy  Blood pressure 130/80, pulse 74, height 5\' 8"  (1.727 m), weight 156 lb (70.8 kg), last menstrual period 12/21/2014. NED. A&Ox3.   No respiratory distress   Abd soft, NT, ND Normal external genitalia with patent urethral meatus  Cystoscopy Procedure Note  Patient identification was confirmed, informed consent was obtained, and patient was prepped using Betadine solution.  Lidocaine jelly was administered per urethral meatus.    Procedure: - Flexible cystoscope introduced, without any difficulty.   - Thorough search of the bladder revealed:    normal urethral meatus    normal urothelium    no stones    no ulcers     no tumors    no urethral polyps    no trabeculation  - Ureteral orifices were normal in position and appearance.  Post-Procedure: - Patient tolerated the procedure well  Assessment/ Plan: No bladder mucosal abnormalities on cystoscopy or findings that would suggest a colovesical fistula Bladder did appear full upon cystoscope and reduction today.  Previous bladder scan was not significant.  She is having back surgery in early October and we will have her follow-up in 2-3 weeks for repeat UA and PVR   Riki Altes, MD

## 2023-05-03 ENCOUNTER — Encounter: Payer: Self-pay | Admitting: Urology

## 2023-05-07 NOTE — Pre-Procedure Instructions (Signed)
Surgical Instructions   Your procedure is scheduled on May 21, 2023. Report to [facility] Main Entrance "A" at 5:30 A.M., then check in with the Admitting office. Any questions or running late day of surgery: call [phone]  Questions prior to your surgery date: call [phone], Monday-Friday, 8am-4pm. If you experience any cold or flu symptoms such as cough, fever, chills, shortness of breath, etc. between now and your scheduled surgery, please notify us at the above number.     Remember:  Do not eat or drink after midnight the night before your surgery    Take these medicines the morning of surgery with A SIP OF WATER: gabapentin (NEURONTIN)  rosuvastatin (CRESTOR)    May take these medicines IF NEEDED: acetaminophen (TYLENOL)  NARCAN nasal spray  nitroGLYCERIN (NITROSTAT) - please call [phone] if dose taken prior to surgery ondansetron (ZOFRAN-ODT)  oxycodone (ROXICODONE)  tiZANidine (ZANAFLEX)  UBRELVY    One week prior to surgery, STOP taking any Aspirin (unless otherwise instructed by your surgeon) Aleve, Naproxen, Ibuprofen, Motrin, Advil, Goody's, BC's, all herbal medications, fish oil, and non-prescription vitamins.   WHAT DO I DO ABOUT MY DIABETES MEDICATION?   STOP taking your OZEMPIC one week prior to surgery. Your last dose will be September 25th.      If your CBG is greater than 220 mg/dL, you may take  of your sliding scale (correction) dose of insulin aspart (NOVOLOG).   HOW TO MANAGE YOUR DIABETES BEFORE AND AFTER SURGERY  Why is it important to control my blood sugar before and after surgery? Improving blood sugar levels before and after surgery helps healing and can limit problems. A way of improving blood sugar control is eating a healthy diet by:  Eating less sugar and carbohydrates  Increasing activity/exercise  Talking with your doctor about reaching your blood sugar goals High blood sugars (greater than 180 mg/dL) can raise your  risk of infections and slow your recovery, so you will need to focus on controlling your diabetes during the weeks before surgery. Make sure that the doctor who takes care of your diabetes knows about your planned surgery including the date and location.  How do I manage my blood sugar before surgery? Check your blood sugar at least 4 times a day, starting 2 days before surgery, to make sure that the level is not too high or low.  Check your blood sugar the morning of your surgery when you wake up and every 2 hours until you get to the Short Stay unit.  If your blood sugar is less than 70 mg/dL, you will need to treat for low blood sugar: Do not take insulin. Treat a low blood sugar (less than 70 mg/dL) with  cup of clear juice (cranberry or apple), 4 glucose tablets, OR glucose gel. Recheck blood sugar in 15 minutes after treatment (to make sure it is greater than 70 mg/dL). If your blood sugar is not greater than 70 mg/dL on recheck, call [phone] for further instructions. Report your blood sugar to the short stay nurse when you get to Short Stay.  If you are admitted to the hospital after surgery: Your blood sugar will be checked by the staff and you will probably be given insulin after surgery (instead of oral diabetes medicines) to make sure you have good blood sugar levels. The goal for blood sugar control after surgery is 80-180 mg/dL.  Do NOT Smoke (Tobacco/Vaping) for 24 hours prior to your procedure.  If you use a CPAP at night, you may bring your mask/headgear for your overnight stay.   You will be asked to remove any contacts, glasses, piercing's, hearing aid's, dentures/partials prior to surgery. Please bring cases for these items if needed.    Patients discharged the day of surgery will not be allowed to drive home, and someone needs to stay with them for 24 hours.  SURGICAL WAITING ROOM VISITATION Patients may have no more than 2 support people in  the waiting area - these visitors may rotate.   Pre-op nurse will coordinate an appropriate time for 1 ADULT support person, who may not rotate, to accompany patient in pre-op.  Children under the age of 8 must have an adult with them who is not the patient and must remain in the main waiting area with an adult.  If the patient needs to stay at the hospital during part of their recovery, the visitor guidelines for inpatient rooms apply.  Please refer to the [facility] website for the visitor guidelines for any additional information.   If you received a COVID test during your pre-op visit  it is requested that you wear a mask when out in public, stay away from anyone that may not be feeling well and notify your surgeon if you develop symptoms. If you have been in contact with anyone that has tested positive in the last 10 days please notify you surgeon.      Pre-operative 5 CHG Bathing Instructions   You can play a key role in reducing the risk of infection after surgery. Your skin needs to be as free of germs as possible. You can reduce the number of germs on your skin by washing with CHG (chlorhexidine gluconate) soap before surgery. CHG is an antiseptic soap that kills germs and continues to kill germs even after washing.   DO NOT use if you have an allergy to chlorhexidine/CHG or antibacterial soaps. If your skin becomes reddened or irritated, stop using the CHG and notify one of our RNs at [phone].   Please shower with the CHG soap starting 4 days before surgery using the following schedule:     Please keep in mind the following:  DO NOT shave, including legs and underarms, starting the day of your first shower.   You may shave your face at any point before/day of surgery.  Place clean sheets on your bed the day you start using CHG soap. Use a clean washcloth (not used since being washed) for each shower. DO NOT sleep with pets once you start using the CHG.   CHG Shower  Instructions:  Wash your face and private area with normal soap. If you choose to wash your hair, wash first with your normal shampoo.  After you use shampoo/soap, rinse your hair and body thoroughly to remove shampoo/soap residue.  Turn the water OFF and apply about 3 tablespoons (45 ml) of CHG soap to a CLEAN washcloth.  Apply CHG soap ONLY FROM YOUR NECK DOWN TO YOUR TOES (washing for 3-5 minutes)  DO NOT use CHG soap on face, private areas, open wounds, or sores.  Pay special attention to the area where your surgery is being performed.  If you are having back surgery, having someone wash your back for you may be helpful. Wait 2 minutes after CHG soap is applied, then you may rinse off the CHG soap.  Pat dry with a  clean towel  Put on clean clothes/pajamas   If you choose to wear lotion, please use ONLY the CHG-compatible lotions on the back of this paper.   Additional instructions for the day of surgery: DO NOT APPLY any lotions, deodorants, cologne, or perfumes.   Do not bring valuables to the hospital. [facility] is not responsible for any belongings/valuables. Do not wear nail polish, gel polish, artificial nails, or any other type of covering on natural nails (fingers and toes) Do not wear jewelry or makeup Put on clean/comfortable clothes.  Please brush your teeth.  Ask your nurse before applying any prescription medications to the skin.     CHG Compatible Lotions   Aveeno Moisturizing lotion  Cetaphil Moisturizing Cream  Cetaphil Moisturizing Lotion  Clairol Herbal Essence Moisturizing Lotion, Dry Skin  Clairol Herbal Essence Moisturizing Lotion, Extra Dry Skin  Clairol Herbal Essence Moisturizing Lotion, Normal Skin  Curel Age Defying Therapeutic Moisturizing Lotion with Alpha Hydroxy  Curel Extreme Care Body Lotion  Curel Soothing Hands Moisturizing Hand Lotion  Curel Therapeutic Moisturizing Cream, Fragrance-Free  Curel Therapeutic Moisturizing Lotion,  Fragrance-Free  Curel Therapeutic Moisturizing Lotion, Original Formula  Eucerin Daily Replenishing Lotion  Eucerin Dry Skin Therapy Plus Alpha Hydroxy Crme  Eucerin Dry Skin Therapy Plus Alpha Hydroxy Lotion  Eucerin Original Crme  Eucerin Original Lotion  Eucerin Plus Crme Eucerin Plus Lotion  Eucerin TriLipid Replenishing Lotion  Keri Anti-Bacterial Hand Lotion  Keri Deep Conditioning Original Lotion Dry Skin Formula Softly Scented  Keri Deep Conditioning Original Lotion, Fragrance Free Sensitive Skin Formula  Keri Lotion Fast Absorbing Fragrance Free Sensitive Skin Formula  Keri Lotion Fast Absorbing Softly Scented Dry Skin Formula  Keri Original Lotion  Keri Skin Renewal Lotion Keri Silky Smooth Lotion  Keri Silky Smooth Sensitive Skin Lotion  Nivea Body Creamy Conditioning Oil  Nivea Body Extra Enriched Lotion  Nivea Body Original Lotion  Nivea Body Sheer Moisturizing Lotion Nivea Crme  Nivea Skin Firming Lotion  NutraDerm 30 Skin Lotion  NutraDerm Skin Lotion  NutraDerm Therapeutic Skin Cream  NutraDerm Therapeutic Skin Lotion  ProShield Protective Hand Cream  Provon moisturizing lotion  Please read over the following fact sheets that you were given.

## 2023-05-08 ENCOUNTER — Encounter (HOSPITAL_COMMUNITY)
Admission: RE | Admit: 2023-05-08 | Discharge: 2023-05-08 | Disposition: A | Discharge status: Home / Self Care | Payer: 59 | Source: Ambulatory Visit | Attending: Neurosurgery | Admitting: Neurosurgery

## 2023-05-08 ENCOUNTER — Other Ambulatory Visit: Payer: Self-pay

## 2023-05-08 ENCOUNTER — Encounter (HOSPITAL_COMMUNITY): Payer: Self-pay

## 2023-05-08 VITALS — HR 100 | Temp 98.7°F | Resp 18 | Ht 68.0 in | Wt 161.1 lb

## 2023-05-08 DIAGNOSIS — Z794 Long term (current) use of insulin: Secondary | ICD-10-CM | POA: Diagnosis not present

## 2023-05-08 DIAGNOSIS — Z01818 Encounter for other preprocedural examination: Secondary | ICD-10-CM

## 2023-05-08 DIAGNOSIS — E119 Type 2 diabetes mellitus without complications: Secondary | ICD-10-CM

## 2023-05-08 DIAGNOSIS — I10 Essential (primary) hypertension: Secondary | ICD-10-CM | POA: Diagnosis not present

## 2023-05-08 DIAGNOSIS — Z01812 Encounter for preprocedural laboratory examination: Secondary | ICD-10-CM | POA: Diagnosis not present

## 2023-05-08 HISTORY — DX: Other specified postprocedural states: Z98.890

## 2023-05-08 LAB — TYPE AND SCREEN
ABO/RH(D): B POS
Antibody Screen: NEGATIVE

## 2023-05-08 LAB — BASIC METABOLIC PANEL
Anion gap: 9 (ref 5–15)
BUN: 12 mg/dL (ref 6–20)
CO2: 29 mmol/L (ref 22–32)
Calcium: 9 mg/dL (ref 8.9–10.3)
Chloride: 102 mmol/L (ref 98–111)
Creatinine, Ser: 0.9 mg/dL (ref 0.44–1.00)
GFR, Estimated: >60 mL/min (ref >60)
Glucose, Bld: 148 mg/dL — ABNORMAL HIGH (ref 70–99)
Potassium: 4.4 mmol/L (ref 3.5–5.1)
Sodium: 140 mmol/L (ref 135–145)

## 2023-05-08 LAB — CBC
HCT: 39.6 % (ref 36.0–46.0)
Hemoglobin: 12.5 g/dL (ref 12.0–15.0)
MCH: 29.8 pg (ref 26.0–34.0)
MCHC: 31.6 g/dL (ref 30.0–36.0)
MCV: 94.5 fL (ref 80.0–100.0)
Platelets: 201 10*3/uL (ref 150–400)
RBC: 4.19 MIL/uL (ref 3.87–5.11)
RDW: 13.6 % (ref 11.5–15.5)
WBC: 4.3 10*3/uL (ref 4.0–10.5)
nRBC: 0 % (ref 0.0–0.2)

## 2023-05-08 LAB — GLUCOSE, CAPILLARY: Glucose-Capillary: 109 mg/dL — ABNORMAL HIGH (ref 70–99)

## 2023-05-08 LAB — HEMOGLOBIN A1C
Hgb A1c MFr Bld: 7.1 % — ABNORMAL HIGH (ref 4.8–5.6)
Mean Plasma Glucose: 157.07 mg/dL

## 2023-05-08 LAB — SURGICAL PCR SCREEN
MRSA, PCR: NEGATIVE
Staphylococcus aureus: NEGATIVE

## 2023-05-08 NOTE — Progress Notes (Signed)
PCP - Marisue Ivan, MD Cardiologist - [name] MD Urologist- Lonna Cobb, Verna Czech, MD    PPM/ICD - denies Device Orders - n/a Rep Notified - n/a  Chest x-ray - 04/01/2023 EKG - 01/30/2023 Stress Test - denies ECHO - 08/22/2018  Cardiac Cath - 07/18/2004  Sleep Study - yes; in 2010 per pt; has been resolved CPAP - pt doesn't use it anymore.  Fasting Blood Sugar - 100-130. CBG 109 at PAT  Checks Blood Sugar a couple of times per week  Last dose of GLP1 agonist-  05/09/2023 GLP1 instructions: hold Ozempic for week prior to surgery.  Blood Thinner Instructions: n/a Aspirin Instructions: n/a  ERAS Protcol - NPO PRE-SURGERY Ensure or G2- no  COVID TEST- n/a; last time tested positive- 04/01/2023. No signs/symptoms at PAT visit.    Anesthesia review: yes; cardiac clearance -appt on 05/15/2023 at [facility].   Patient denies shortness of breath, fever, cough and chest pain at PAT appointment   All instructions explained to the patient, with a verbal understanding of the material. Patient agrees to go over the instructions while at home for a better understanding. Patient also instructed to self quarantine after being tested for COVID-19. The opportunity to ask questions was provided.

## 2023-05-09 NOTE — Progress Notes (Addendum)
Anesthesia Chart Review:  Case: [id number] Date/Time: 05/21/23 0715   Procedure: PLIF - L3-L4 - L4-L5 with removal of hardware L5-S1 - Posterior Lateral and Interbody fusion (Back)   Anesthesia type: General   Pre-op diagnosis: Stenosis   [facility] RO[facility] / [facility]   Surgeons: Donalee Citrin, MD       DISCUSSION: Patient is a 47 year old Marissa [name] scheduled for the above procedure.   History includes never smoker, post-operative N/V,  HTN, HLD, DM2, CAD (possible MI ~ 2005/2006 "due to medication", 50-60% mid LAD 2005; 80% distal PDA/PLA, medical therapy 03/2014), dilated non-ischemic cardiomyopathy (EF 35% 03/2014 in setting of recent PNA, CAD felt out of proportion to LVL dysfunction), CHF (acute systolic CHF 03/13/14, EF < 20%), dysrhythmia (tachycardia, prolonged QT), CKD, CVA (06/2021), spinal surgery (cervical fusion, lumbar laminectomy 2011; L1-2 laminectomy 05/07/13, L5-S1 laminectomy 10/29/13 with redo 01/16/14, I&D of wound infection 01/25/14 & 02/25/14), cholecystectomy (04/08/15), morbid obesity (s/p gastrectomy laparoscopic sleeve 05/31/17; panniculectomy 09/08/20), hysterectomy (03/08/18 with LOA,small bowel repair/resection, incidental appendectomy; colon resection 03/16/18; LOA, small bowel resection with primary anastomosis 02/24/20), anemia, OSA (2010, "resolved" after weight loss).   She has a [facility] preoperative evaluation on 05/15/23 at 1:00 PM. Currently, last visit noted is from 08/16/18 with [name], MD. Echo updated then and showed EF improved > 55%.    A1c 7.1%. She is followed by endocrinologist DR. [name]. She is on Novolog (100 unit/mL) 2-5 units TID with meals, Ozempic 2 mg weekly. Last Ozempic 05/09/23.   Will leave chart for follow-up cardiology evaluation.  ADDENDUM 05/18/23 4:28 PM:  [name] had preoperative cardiology evaluation with [name], NP on 05/15/23. Office note is not yet published in Care Everywhere. A stress echocardiogram was recommended  and was done on 05/16/23. Results interpreted as abnormal resting study (diffuse hypokinesis by study documentation), but improved with stress (normal at low stress, hyperkinetic at peak stress by study documentation), EF 50% at rest, > 55% with stress. There was trivial MR/TR. Dr. Lupita Shutter reviewed and wrote, "Mildly reduced heart squeezing seen and rest.  Improved with dobutamine.  No evidence of blocked arteries.  Normal BP and HR response to dobutamine suggest low risk for surgery." Copy of input placed in shadow chart.   Anesthesia team to evaluate on the day of surgery.   VS: Pulse 100   Temp 37.1 C   Resp 18   Ht 5\' 8"  (1.727 m)   Wt 73.1 kg   LMP 12/21/2014 (Approximate)   SpO2 100%   BMI 24.50 kg/m  BP Readings from Last 3 Encounters:  05/02/23 130/80  04/25/23 112/78  04/24/23 (!) 83/58     PROVIDERS: Marisue Ivan, MD is PCP Youlanda Mighty, MD is cardiologist Rolla Flatten) Rickard Patience, MD is HEM. Last visit 08/16/22 for IDA and B12 deficiency. Verdis Frederickson, MD is endocrinologist Gavin Potters, [facility]) Irineo Axon, MD is urologist, last visit 05/02/23.  Sharlyne Cai, PA is ID provider Rolla Flatten). Last seen 9/Marissa/24 for recurrent UTIs with multiple drug allergies.  Jola Babinski, MD is GYN ([facility]) Hazle Quant, [place], DO is neurologist [phone]). Last evaluation 09/01/22 for chronic migraines.   LABS: Labs reviewed: Acceptable for surgery. (all labs ordered are listed, but only abnormal results are displayed)  Labs Reviewed  GLUCOSE, CAPILLARY - Abnormal; Notable for the following components:      Result Value   Glucose-Capillary 109 (*)    All other components within normal limits  HEMOGLOBIN A1C - Abnormal; Notable for the following  components:   Hgb A1c MFr Bld 7.1 (*)    All other components within normal limits  BASIC METABOLIC PANEL - Abnormal; Notable for the following components:   Glucose, Bld 148 (*)    All other components within normal limits  SURGICAL PCR SCREEN   CBC  TYPE AND SCREEN     IMAGES: CXR 04/01/23: FINDINGS: The heart size and mediastinal contours are within normal limits. Low lung volumes are noted. There is no evidence of an acute infiltrate, pleural effusion or pneumothorax. Radiopaque surgical clips are seen within the right upper quadrant. A radiopaque fusion plate and screws are seen overlying the cervical spine. The visualized skeletal structures are unremarkable. IMPRESSION: Low lung volumes without acute or active cardiopulmonary disease.  CT L-spine 12/04/22: IMPRESSION: 1. L5-S1 PLIF with solid arthrodesis.  T12-L1 ankylosis. 2. Generalized lumbar spine degeneration with scoliosis and mild L4-5 anterolisthesis. 3. Compressive spinal stenosis at L4-5 and L5-S1, see recent MRI. Right subarticular recess impingement at L1-2. 4. L1-2 moderate right foraminal narrowing.   MRI L-spine 10/08/22: IMPRESSION: 1. Chronic decompression and fusion at L5-S1 is stable since a 2017 MRI. 2. Progressed adjacent segment disease at L4-L5, and mild new retrolisthesis at L3-L4. Increased multifactorial moderate to severe spinal stenosis at both levels since 2017. Moderate to severe bilateral lateral recess stenosis at the descending L5 nerve levels. And moderate L4 foraminal stenosis. 3. L1-L2 chronic disc and endplate degeneration is stable along with borderline to mild stenosis.   EKG:  EKG 05/16/23 (as part of Stress Echo [facility]): NSR at 99 bpm  EKG 01/30/23: Sinus tachycardia at 111 bpm Otherwise normal ECG When compared with ECG of 17-Apr-2022 08:Marissa, Sinus rhythm has replaced Ectopic atrial rhythm QRS axis Shifted left Confirmed by UNCONFIRMED, DOCTOR ([id number]), editor Lonell Face [phone]) on 01/31/2023 7:14:10 AM   CV: Dobutamine Stress Echo 05/16/23 ([facility]): INTERPRETATION: ABNORMAL STRESS TEST. ABNORMAL RESTING STUDY, IMPROVES WITH STRESS. VALVULAR REGURGITATION: TRIVIAL MR, TRIVIAL TR NO VALVULAR STENOSIS RESTING  HYPERTENSION - APPROPRIATE RESPONSE  - Result notations within the report indicate there was diffuse LV hypokinesis at rest, but normal wall motion with low stress and hyperkinetic at peak stress. EF 50% at rest and > 55% with stress. Cardiologist Dr. Youlanda Mighty reviewed and wrote, "Mildly reduced heart squeezing seen and rest.  Improved with dobutamine.  No evidence of blocked arteries.  Normal BP and HR response to dobutamine suggest low risk for surgery."    Echo 08/22/18 ([facility]): NORMAL LEFT VENTRICULAR FUNCTION. EF > 55% (Estimated) NORMAL LA PRESSURES WITH NORMAL DIASTOLIC FUNCTION  NORMAL RIGHT VENTRICULAR SYSTOLIC FUNCTION  VALVULAR REGURGITATION: TRIVIAL MR, TRIVIAL PR, TRIVIAL TR  NO VALVULAR STENOSIS  Compared with prior Echo study on 03/24/2014: LV function HAS IMPROVED    Stress Cardiac MRI 03/26/14 ([facility]; EF 35% 03/24/14 echo): SUMMARY:  1. The left ventricle is normal in cavity size and wall thickness. Global systolic function is severely reduced, with a quantified ejection fraction of 35%. There is diffuse severe hypokinesis, apart from the basal lateral segments which are normal to mildly hypokinetic.  LV volumes absolute and indexed to BSA are listed below, with age- and gender-matched values in parentheses: EDV (ml): 131 (135, 94-175) ESV (ml): 85 ml (45, 27-64) EDVI (ml/m2): 63 (79, 62-96) ESVI (ml/m2): 41 (27, 17-36)  2. The right ventricle is normal in cavity size and wall thickness.  RV systolic dysfunction is low normal to mildly reduced.  3. The left atrium is moderately enlarged. The right  atrium is normal in size.  4. The aortic valve is trileaflet in morphology. There is no significant aortic stenosis or regurgitation.  5. Delayed enhancement imaging demonstrates no evidence of myocardial infarction, scarring, or infiltration.  There is no evidence of cardiac involvement of sarcoidosis.  6. Adenosine stress perfusion imaging demonstrates no evidence of  inducible myocardial ischemia.   7. A large stone (1.7 x 1.6 cm in size) is present in the neck of the gallbladder.  8.  There is a moderate sized, circumferential pericardial effusion.  9.  There is no evidence of an intracardiac thrombus.  10.  There are trivial pleural effusions.    RHC/LHC 03/24/2014 ([facility], outlined in Admission records): RA: 5 mmHg (mean) RV: 38 / 6 mmHg PA: 38/20 29 mmHg (mean) PCW: 10 mmHg (mean) AV O2: 4.0 vol% Cardiac output: 6.6 L/min Cardiac index: 3.1 L/min-m2 PVR: 2.9 Wood units  Coronary arteries Dominance: Right dominant Distal RCA: 80% Diffuse PDA and PL on Right  - Per Follow-up [facility] Notes 04/21/14, "She had an RCA lesion but her global cardiomyopathy was out of proportion to identified CAD. Filling pressures were relatively normal with preserved cardiac output. No infiltrative or ischemic process identified by cardiac MRI. The patient was identified as having non-ischemic cardiomyopathy..."   48 hour Holter monitor 09/11/13 [facility], scanned under Media tab, Marcello Fennel Date 10/29/13):  Conclusion: Sinus rhythm, sinus tachycardia with rare PVCs. No sustained ventricular arrhythmias.     Cardiac cath 07/18/04 [facility], scanned under Media tab, Marcello Fennel Date 05/07/13): Normal LM LAD was widely patent and normal throughout most of its course except for a focal cylindrical narrowing in the mid LAD estimated to be 50-60%. First septal branch arises proximal to the stenosis. Diagonal branches are medium and small, respectively.  LCX system appears normal.  RCA is a dominant vessel with minor luminal irregularities in the distal segment proximal to the bifurcation of the PL and PDA branches.  EF 50-55%. No regional dysfunction seen.  IMPRESSION:  Moderate stenosis in the mid LAD with minor luminal irregularity in the RCA. Low normal systolic function with elevated LVEDP.   Past Medical  History:  Diagnosis Date   Allergy    Anemia    Anxiety    Arthritis    Back pain, chronic    Cardiomyopathy, dilated, nonischemic (HCC)    CHF (congestive heart failure) (HCC)    Cholelithiasis    Coronary artery disease    Depression    Diabetes mellitus without complication (HCC)    fasting cbg 50-140s   Dysrhythmia    tachycardia   GERD (gastroesophageal reflux disease)    otc meds   Headache(784.0)    Hypercholesteremia    Hyperlipemia    Hypertension    IDA (iron deficiency anemia) 09/29/2019   Insulin pump in place    pt had insulin pump but it is now removed (02-18-16)   MI, old    2006   Migraines    once/month maybe - can last up to two weeks   Myocardial infarction [facility]) 2006   "due to medication"; no evidence of ischemia or infarction by nuclear stress test '11   Neck pain, chronic    Neuropathy    legs   Pneumonia 2015   [facility]   PONV (postoperative nausea and vomiting)    Prolonged QT interval syndrome    Renal insufficiency    Restless leg syndrome, controlled    Sepsis (HCC) 2016  secondary to surgery   Sleep apnea    does not use cpap since losing alot of weight   Stroke [facility]) 06/2021   Vertigo    nop episodes 2020   Vision loss    due to diabetes   Wears contact lenses    Wears dentures    partial lower    Past Surgical History:  Procedure Laterality Date   ACHILLES TENDON SURGERY Right 04/22/2019   Procedure: [facility] PROCEDURE WITH SUTURE ANCHOR;  Surgeon: Rosetta Posner, DPM;  Location: [facility];  Service: Podiatry;  Laterality: Right;   APPENDECTOMY N/A 03/08/2018   Procedure: APPENDECTOMY;  Surgeon: Christeen Douglas, MD;  Location: [facility] ORS;  Service: Gynecology;  Laterality: N/A;  By Dr. Ara Kussmaul SURGERY  2011   Lumbar    BOWEL RESECTION N/A 03/08/2018   Procedure: SMALL BOWEL RESECTION;  Surgeon: Christeen Douglas, MD;  Location: [facility] ORS;  Service: Gynecology;  Laterality: N/A;   CARDIAC CATHETERIZATION  07/18/2004    50-60% mid LAD, minor luminal irregularities RCA, normal LM and CX, EF 50-55% [facility])   carpel tunnel Bilateral    CERVICAL FUSION  2006   CHOLECYSTECTOMY N/A 04/08/2015   Procedure: LAPAROSCOPIC CHOLECYSTECTOMY WITH INTRAOPERATIVE CHOLANGIOGRAM;  Surgeon: Lattie Haw, MD;  Location: [facility] ORS;  Service: General;  Laterality: N/A;   COLON RESECTION N/A 03/16/2018   Procedure: COLON RESECTION;  Surgeon: Lattie Haw, MD;  Location: [facility] ORS;  Service: General;  Laterality: N/A;   COLONOSCOPY WITH PROPOFOL N/A 10/22/2019   Procedure: COLONOSCOPY WITH PROPOFOL;  Surgeon: Toledo, Boykin Nearing, MD;  Location: [facility] ENDOSCOPY;  Service: Gastroenterology;  Laterality: N/A;  [facility] DID RAPID TEST; COPY ON CHART   ESOPHAGOGASTRODUODENOSCOPY (EGD) WITH PROPOFOL N/A 10/22/2019   Procedure: ESOPHAGOGASTRODUODENOSCOPY (EGD) WITH PROPOFOL;  Surgeon: Toledo, Boykin Nearing, MD;  Location: [facility] ENDOSCOPY;  Service: Gastroenterology;  Laterality: N/A;   HAMMER TOE SURGERY Right 04/22/2019   Procedure: HAMMER TOE CORRECTION X 4;  Surgeon: Rosetta Posner, DPM;  Location: [facility]ERY [facility];  Service: Podiatry;  Laterality: Right;  Diabetic - insulin and oral meds   LAPAROSCOPIC GASTRECTOMY  05/31/2017   [facility], Dr. Smitty Cords   LUMBAR LAMINECTOMY/DECOMPRESSION MICRODISCECTOMY Right 05/07/2013   Procedure: Right Lumbar one-two laminectomy;  Surgeon: Mariam Dollar, MD;  [facility];  Service: Neurosurgery;  Laterality: Right;   LUMBAR LAMINECTOMY/DECOMPRESSION MICRODISCECTOMY Left 10/29/2013   Procedure: Left Lumbar five-Sacral one Laminectomy;  Surgeon: Mariam Dollar, MD;  [facility];  Service: Neurosurgery;  Laterality: Left;   LUMBAR WOUND DEBRIDEMENT N/A 01/25/2014   Procedure: LUMBAR WOUND DEBRIDEMENT;  Surgeon: Temple Pacini, MD;  [facility];  Service: Neurosurgery;  Laterality: N/A;   LUMBAR WOUND DEBRIDEMENT N/A 02/25/2014   Procedure: LUMBAR WOUND DEBRIDEMENT;  Surgeon:  Mariam Dollar, MD;  [facility];  Service: Neurosurgery;  Laterality: N/A;   LYSIS OF ADHESION N/A 03/08/2018   Procedure: LYSIS OF ADHESION;  Surgeon: Christeen Douglas, MD;  Location: [facility] ORS;  Service: Gynecology;  Laterality: N/A;   SALPINGOOPHORECTOMY Bilateral    1 1997. 2nd 2001   SHOULDER ARTHROSCOPY WITH BICEPSTENOTOMY Left 03/24/2016   Procedure: shoulder arthroscopy with biceps TENOTOMY, removal loose body, limited synovectomy;  Surgeon: Erin Sons, MD;  Location: [facility] ORS;  Service: Orthopedics;  Laterality: Left;   shoulder sugery     7/17 rotator cuff   SMALL BOWEL REPAIR N/A 03/08/2018   Procedure: SMALL BOWEL REPAIR;  Surgeon:  Christeen Douglas, MD;  Location: [facility] ORS;  Service: Gynecology;  Laterality: N/A;    MEDICATIONS:  acetaminophen (TYLENOL) 500 MG tablet   BIOTIN PO   estradiol (ESTRACE) 0.1 MG/GM vaginal cream   gabapentin (NEURONTIN) 600 MG tablet   insulin aspart (NOVOLOG) 100 UNIT/ML injection   NARCAN 4 MG/0.1ML LIQD nasal spray kit   nitroGLYCERIN (NITROSTAT) 0.4 MG SL tablet   nortriptyline (PAMELOR) 10 MG capsule   ondansetron (ZOFRAN-ODT) 4 MG disintegrating tablet   oxycodone (ROXICODONE) 30 MG immediate release tablet   OZEMPIC, 2 MG/DOSE, 8 MG/3ML SOPN   rosuvastatin (CRESTOR) 20 MG tablet   tiZANidine (ZANAFLEX) 4 MG tablet   UBRELVY 100 MG TABS   No current facility-administered medications for this encounter.    Shonna Chock, PA-C Surgical Short Stay/Anesthesiology [facility] Phone [phone] [facility] Phone [phone] 05/11/2023 5:06 PM

## 2023-05-11 NOTE — Anesthesia Preprocedure Evaluation (Addendum)
Anesthesia Evaluation  Patient identified by MRN, date of birth, ID band  Reviewed: Allergy & Precautions, NPO status , Patient's Chart, lab work & pertinent test results  History of Anesthesia Complications (+) PONV and history of anesthetic complications  Airway Mallampati: II  TM Distance: >3 FB Neck ROM: Full    Dental  (+) Teeth Intact, Dental Advisory Given   Pulmonary sleep apnea    breath sounds clear to auscultation       Cardiovascular hypertension, + CAD, + Past MI and +CHF  + dysrhythmias  Rhythm:Regular Rate:Normal     Neuro/Psych  Headaches PSYCHIATRIC DISORDERS Anxiety Depression     Neuromuscular disease CVA    GI/Hepatic Neg liver ROS,GERD  ,,  Endo/Other  diabetes    Renal/GU Renal disease     Musculoskeletal  (+) Arthritis ,    Abdominal   Peds  Hematology  (+) Blood dyscrasia, anemia   Anesthesia Other Findings Multiple piercings. Pt accepts risk   Reproductive/Obstetrics                             Anesthesia Physical Anesthesia Plan  ASA: 3  Anesthesia Plan: General   Post-op Pain Management: Tylenol PO (pre-op)*   Induction: Intravenous  PONV Risk Score and Plan: 4 or greater and Ondansetron, Dexamethasone and Midazolam  Airway Management Planned: Oral ETT  Additional Equipment: None  Intra-op Plan:   Post-operative Plan: Extubation in OR  Informed Consent:   Plan Discussed with: CRNA  Anesthesia Plan Comments: (PAT note written by Shonna Chock, PA-C. [facility] preoperative evaluation on 05/15/23.   Dobutamine Stress Echo 05/16/23 ([facility]): INTERPRETATION: ABNORMAL STRESS TEST. ABNORMAL RESTING STUDY, IMPROVES WITH STRESS. VALVULAR REGURGITATION: TRIVIAL MR, TRIVIAL TR NO VALVULAR STENOSIS RESTING HYPERTENSION - APPROPRIATE RESPONSE  - Result notations within the report indicate there was diffuse LV hypokinesis at rest, but normal wall  motion with low stress and hyperkinetic at peak stress. EF 50% at rest and > 55% with stress. Cardiologist Dr. Youlanda Mighty reviewed and wrote, "Mildly reduced heart squeezing seen and rest.  Improved with dobutamine.  No evidence of blocked arteries.  Normal BP and HR response to dobutamine suggest low risk for surgery."   )       Anesthesia Quick Evaluation

## 2023-05-14 ENCOUNTER — Ambulatory Visit: Payer: 59 | Admitting: Physician Assistant

## 2023-05-14 ENCOUNTER — Telehealth: Payer: Self-pay | Admitting: Physician Assistant

## 2023-05-14 NOTE — Telephone Encounter (Signed)
Pt informed. Voiced understanding.  

## 2023-05-14 NOTE — Telephone Encounter (Signed)
[place] with no significant findings. The purpose of today's visit was to scan her bladder to ensure appropriate emptying since undergoing back surgery. Ok to reschedule for UA, PVR, soonest available. Alternatively she can follow up as needed.

## 2023-05-14 NOTE — Telephone Encounter (Signed)
Pt called office to let you know that she had u/s done on 7/23.  She didn't come for appt today, she said Stoioff asked her at cysto appt if she had u/s done.  She wants to know what she should do next.

## 2023-05-16 ENCOUNTER — Ambulatory Visit (INDEPENDENT_AMBULATORY_CARE_PROVIDER_SITE_OTHER): Payer: 59 | Admitting: Physician Assistant

## 2023-05-16 ENCOUNTER — Encounter: Payer: Self-pay | Admitting: Physician Assistant

## 2023-05-21 ENCOUNTER — Ambulatory Visit (HOSPITAL_COMMUNITY): Payer: 59

## 2023-05-21 ENCOUNTER — Encounter (HOSPITAL_COMMUNITY): Payer: Self-pay | Admitting: Neurosurgery

## 2023-05-21 ENCOUNTER — Other Ambulatory Visit: Payer: Self-pay

## 2023-05-21 ENCOUNTER — Encounter (HOSPITAL_COMMUNITY)
Admission: RE | Disposition: A | Discharge status: Home / Self Care | Payer: Self-pay | Source: Home / Self Care | Attending: Neurosurgery

## 2023-05-21 ENCOUNTER — Ambulatory Visit (HOSPITAL_BASED_OUTPATIENT_CLINIC_OR_DEPARTMENT_OTHER): Payer: 59 | Admitting: Certified Registered Nurse Anesthetist

## 2023-05-21 ENCOUNTER — Observation Stay (HOSPITAL_COMMUNITY)
Admission: RE | Admit: 2023-05-21 | Discharge: 2023-05-22 | Disposition: A | Discharge status: Home / Self Care | Payer: 59 | Attending: Neurosurgery | Admitting: Neurosurgery

## 2023-05-21 ENCOUNTER — Ambulatory Visit (HOSPITAL_COMMUNITY): Payer: 59 | Admitting: Vascular Surgery

## 2023-05-21 DIAGNOSIS — M48061 Spinal stenosis, lumbar region without neurogenic claudication: Secondary | ICD-10-CM | POA: Diagnosis present

## 2023-05-21 DIAGNOSIS — I509 Heart failure, unspecified: Secondary | ICD-10-CM | POA: Diagnosis not present

## 2023-05-21 DIAGNOSIS — I110 Hypertensive heart disease with heart failure: Secondary | ICD-10-CM | POA: Diagnosis not present

## 2023-05-21 DIAGNOSIS — M532X6 Spinal instabilities, lumbar region: Secondary | ICD-10-CM | POA: Diagnosis not present

## 2023-05-21 DIAGNOSIS — Z794 Long term (current) use of insulin: Secondary | ICD-10-CM | POA: Diagnosis not present

## 2023-05-21 DIAGNOSIS — Z79899 Other long term (current) drug therapy: Secondary | ICD-10-CM | POA: Insufficient documentation

## 2023-05-21 DIAGNOSIS — M5116 Intervertebral disc disorders with radiculopathy, lumbar region: Secondary | ICD-10-CM | POA: Diagnosis not present

## 2023-05-21 DIAGNOSIS — Z8673 Personal history of transient ischemic attack (TIA), and cerebral infarction without residual deficits: Secondary | ICD-10-CM | POA: Insufficient documentation

## 2023-05-21 DIAGNOSIS — I2510 Atherosclerotic heart disease of native coronary artery without angina pectoris: Secondary | ICD-10-CM | POA: Insufficient documentation

## 2023-05-21 DIAGNOSIS — E119 Type 2 diabetes mellitus without complications: Secondary | ICD-10-CM | POA: Insufficient documentation

## 2023-05-21 DIAGNOSIS — R5383 Other fatigue: Principal | ICD-10-CM

## 2023-05-21 LAB — GLUCOSE, CAPILLARY
Glucose-Capillary: 81 mg/dL (ref 70–99)
Glucose-Capillary: 207 mg/dL — ABNORMAL HIGH (ref 70–99)
Glucose-Capillary: 287 mg/dL — ABNORMAL HIGH (ref 70–99)
Glucose-Capillary: 315 mg/dL — ABNORMAL HIGH (ref 70–99)

## 2023-05-21 SURGERY — POSTERIOR LUMBAR FUSION 2 LEVEL
Anesthesia: General | Site: Back

## 2023-05-21 MED ORDER — PANTOPRAZOLE SODIUM 40 MG PO TBEC
40.0000 mg | DELAYED_RELEASE_TABLET | Freq: Every day | ORAL | Status: DC
  Administered 2023-05-21: 40 mg via ORAL
  Filled 2023-05-21: qty 1

## 2023-05-21 MED ORDER — ONDANSETRON HCL 4 MG/2ML IJ SOLN
INTRAMUSCULAR | Status: AC
  Administered 2023-05-21: 4 mg
  Filled 2023-05-21: qty 2

## 2023-05-21 MED ORDER — CHLORHEXIDINE GLUCONATE CLOTH 2 % EX PADS: 6.0000 | MEDICATED_PAD | Freq: Once | CUTANEOUS | Status: DC

## 2023-05-21 MED ORDER — ONDANSETRON HCL 4 MG PO TABS: 4.0000 mg | ORAL_TABLET | Freq: Four times a day (QID) | ORAL | Status: DC | PRN

## 2023-05-21 MED ORDER — ACETAMINOPHEN 650 MG RE SUPP: 650.0000 mg | RECTAL | Status: DC | PRN

## 2023-05-21 MED ORDER — CHLORHEXIDINE GLUCONATE 0.12 % MT SOLN
15.0000 mL | Freq: Once | OROMUCOSAL | Status: AC
  Administered 2023-05-21: 15 mL via OROMUCOSAL
  Filled 2023-05-21: qty 15

## 2023-05-21 MED ORDER — ACETAMINOPHEN 500 MG PO TABS: 1000.0000 mg | ORAL_TABLET | Freq: Two times a day (BID) | ORAL | Status: DC | PRN

## 2023-05-21 MED ORDER — ONDANSETRON HCL 4 MG/5ML PO SOLN
4.0000 mg | Freq: Once | ORAL | Status: DC
  Filled 2023-05-21: qty 5

## 2023-05-21 MED ORDER — HYDROMORPHONE HCL 1 MG/ML IJ SOLN
0.5000 mg | INTRAMUSCULAR | Status: DC | PRN
  Administered 2023-05-21: 0.5 mg via INTRAVENOUS
  Filled 2023-05-21: qty 0.5

## 2023-05-21 MED ORDER — ROCURONIUM BROMIDE 10 MG/ML (PF) SYRINGE
PREFILLED_SYRINGE | INTRAVENOUS | Status: AC
  Filled 2023-05-21: qty 10

## 2023-05-21 MED ORDER — 0.9 % SODIUM CHLORIDE (POUR BTL) OPTIME
TOPICAL | Status: DC | PRN
  Administered 2023-05-21: 1000 mL

## 2023-05-21 MED ORDER — OXYCODONE HCL 5 MG PO TABS
ORAL_TABLET | ORAL | Status: AC
  Filled 2023-05-21: qty 3

## 2023-05-21 MED ORDER — SUGAMMADEX SODIUM 200 MG/2ML IV SOLN
INTRAVENOUS | Status: DC | PRN
  Administered 2023-05-21: 140.6 mg via INTRAVENOUS

## 2023-05-21 MED ORDER — PHENYLEPHRINE 80 MCG/ML (10ML) SYRINGE FOR IV PUSH (FOR BLOOD PRESSURE SUPPORT)
PREFILLED_SYRINGE | INTRAVENOUS | Status: AC
  Filled 2023-05-21: qty 10

## 2023-05-21 MED ORDER — GABAPENTIN 400 MG PO CAPS
1200.0000 mg | ORAL_CAPSULE | Freq: Every day | ORAL | Status: DC
  Administered 2023-05-21: 1200 mg via ORAL
  Filled 2023-05-21: qty 3

## 2023-05-21 MED ORDER — DROPERIDOL 2.5 MG/ML IJ SOLN: 0.6250 mg | Freq: Once | INTRAMUSCULAR | Status: DC | PRN

## 2023-05-21 MED ORDER — CYCLOBENZAPRINE HCL 10 MG PO TABS: 10.0000 mg | ORAL_TABLET | Freq: Three times a day (TID) | ORAL | Status: DC | PRN

## 2023-05-21 MED ORDER — ACETAMINOPHEN 500 MG PO TABS
ORAL_TABLET | ORAL | Status: AC
  Filled 2023-05-21: qty 2

## 2023-05-21 MED ORDER — HYDROMORPHONE HCL 1 MG/ML IJ SOLN
INTRAMUSCULAR | Status: AC
  Filled 2023-05-21: qty 1

## 2023-05-21 MED ORDER — INSULIN ASPART 100 UNIT/ML IJ SOLN: 2.0000 [IU] | Freq: Three times a day (TID) | INTRAMUSCULAR | Status: DC

## 2023-05-21 MED ORDER — BUPIVACAINE-EPINEPHRINE (PF) 0.25% -1:200000 IJ SOLN
INTRAMUSCULAR | Status: AC
  Filled 2023-05-21: qty 30

## 2023-05-21 MED ORDER — MIDAZOLAM HCL 2 MG/2ML IJ SOLN
INTRAMUSCULAR | Status: AC
  Filled 2023-05-21: qty 2

## 2023-05-21 MED ORDER — LIDOCAINE 2% (20 MG/ML) 5 ML SYRINGE
INTRAMUSCULAR | Status: AC
  Filled 2023-05-21: qty 5

## 2023-05-21 MED ORDER — THROMBIN 20000 UNITS EX SOLR
CUTANEOUS | Status: DC | PRN
  Administered 2023-05-21: 20 mL via TOPICAL

## 2023-05-21 MED ORDER — LIDOCAINE-EPINEPHRINE 1 %-1:100000 IJ SOLN
INTRAMUSCULAR | Status: AC
  Filled 2023-05-21: qty 1

## 2023-05-21 MED ORDER — ROCURONIUM BROMIDE 10 MG/ML (PF) SYRINGE
PREFILLED_SYRINGE | INTRAVENOUS | Status: DC | PRN
  Administered 2023-05-21: 10 mg via INTRAVENOUS
  Administered 2023-05-21: 20 mg via INTRAVENOUS
  Administered 2023-05-21: 60 mg via INTRAVENOUS
  Administered 2023-05-21: 10 mg via INTRAVENOUS
  Administered 2023-05-21: 40 mg via INTRAVENOUS

## 2023-05-21 MED ORDER — TIZANIDINE HCL 4 MG PO TABS
4.0000 mg | ORAL_TABLET | Freq: Three times a day (TID) | ORAL | Status: DC | PRN
  Administered 2023-05-21 – 2023-05-22 (×2): 4 mg via ORAL
  Filled 2023-05-21 (×3): qty 1

## 2023-05-21 MED ORDER — ACETAMINOPHEN 160 MG/5ML PO SOLN: 325.0000 mg | Freq: Once | ORAL | Status: DC | PRN

## 2023-05-21 MED ORDER — ALUM & MAG HYDROXIDE-SIMETH 200-200-20 MG/5ML PO SUSP: 30.0000 mL | Freq: Four times a day (QID) | ORAL | Status: DC | PRN

## 2023-05-21 MED ORDER — BUPIVACAINE HCL (PF) 0.25 % IJ SOLN
INTRAMUSCULAR | Status: AC
  Filled 2023-05-21: qty 30

## 2023-05-21 MED ORDER — HYDROCODONE-ACETAMINOPHEN 5-325 MG PO TABS
2.0000 | ORAL_TABLET | ORAL | Status: DC | PRN
  Administered 2023-05-22 (×3): 2 via ORAL
  Filled 2023-05-21 (×3): qty 2

## 2023-05-21 MED ORDER — BUPIVACAINE LIPOSOME 1.3 % IJ SUSP
INTRAMUSCULAR | Status: DC | PRN
  Administered 2023-05-21: 20 mL

## 2023-05-21 MED ORDER — ACETAMINOPHEN 10 MG/ML IV SOLN: 1000.0000 mg | Freq: Once | INTRAVENOUS | Status: DC | PRN

## 2023-05-21 MED ORDER — ACETAMINOPHEN 325 MG PO TABS: 325.0000 mg | ORAL_TABLET | Freq: Once | ORAL | Status: DC | PRN

## 2023-05-21 MED ORDER — SODIUM CHLORIDE 0.9 % IV SOLN: 250.0000 mL | INTRAVENOUS | Status: DC

## 2023-05-21 MED ORDER — ONDANSETRON HCL 4 MG/2ML IJ SOLN
INTRAMUSCULAR | Status: AC
  Filled 2023-05-21: qty 2

## 2023-05-21 MED ORDER — ACETAMINOPHEN 500 MG PO TABS
1000.0000 mg | ORAL_TABLET | Freq: Once | ORAL | Status: AC
  Administered 2023-05-21: 1000 mg via ORAL

## 2023-05-21 MED ORDER — INSULIN ASPART 100 UNIT/ML IJ SOLN
0.0000 [IU] | Freq: Three times a day (TID) | INTRAMUSCULAR | Status: DC
  Administered 2023-05-21: 8 [IU] via SUBCUTANEOUS

## 2023-05-21 MED ORDER — CEFAZOLIN SODIUM-DEXTROSE 2-4 GM/100ML-% IV SOLN
2.0000 g | Freq: Three times a day (TID) | INTRAVENOUS | Status: DC
  Administered 2023-05-21 – 2023-05-22 (×4): 2 g via INTRAVENOUS
  Filled 2023-05-21 (×6): qty 100

## 2023-05-21 MED ORDER — BIOTIN 2.5 MG PO TABS: ORAL_TABLET | Freq: Every day | ORAL | Status: DC

## 2023-05-21 MED ORDER — ESTRADIOL 0.1 MG/GM VA CREA: 1.0000 | TOPICAL_CREAM | Freq: Every day | VAGINAL | Status: DC

## 2023-05-21 MED ORDER — VANCOMYCIN HCL IN DEXTROSE 1-5 GM/200ML-% IV SOLN
1000.0000 mg | INTRAVENOUS | Status: AC
  Administered 2023-05-21: 1000 mg via INTRAVENOUS
  Filled 2023-05-21: qty 200

## 2023-05-21 MED ORDER — SODIUM CHLORIDE 0.9% FLUSH
3.0000 mL | Freq: Two times a day (BID) | INTRAVENOUS | Status: DC
  Administered 2023-05-21 – 2023-05-22 (×3): 3 mL via INTRAVENOUS

## 2023-05-21 MED ORDER — FENTANYL CITRATE (PF) 250 MCG/5ML IJ SOLN
INTRAMUSCULAR | Status: AC
  Filled 2023-05-21: qty 5

## 2023-05-21 MED ORDER — THROMBIN 20000 UNITS EX SOLR
CUTANEOUS | Status: AC
  Filled 2023-05-21: qty 20000

## 2023-05-21 MED ORDER — OXYCODONE HCL 5 MG PO TABS
15.0000 mg | ORAL_TABLET | ORAL | Status: DC | PRN
  Administered 2023-05-21 – 2023-05-22 (×5): 15 mg via ORAL
  Filled 2023-05-21 (×4): qty 3

## 2023-05-21 MED ORDER — LIDOCAINE 2% (20 MG/ML) 5 ML SYRINGE
INTRAMUSCULAR | Status: DC | PRN
  Administered 2023-05-21: 80 mg via INTRAVENOUS

## 2023-05-21 MED ORDER — LACTATED RINGERS IV SOLN: INTRAVENOUS | Status: DC

## 2023-05-21 MED ORDER — MIDAZOLAM HCL 5 MG/5ML IJ SOLN
INTRAMUSCULAR | Status: DC | PRN
  Administered 2023-05-21: 2 mg via INTRAVENOUS

## 2023-05-21 MED ORDER — CEFAZOLIN SODIUM-DEXTROSE 2-3 GM-%(50ML) IV SOLR
INTRAVENOUS | Status: DC | PRN
Start: 2023-05-21 — End: 2023-05-21
  Administered 2023-05-21: 2 g via INTRAVENOUS

## 2023-05-21 MED ORDER — HYDROMORPHONE HCL 1 MG/ML IJ SOLN
0.2500 mg | INTRAMUSCULAR | Status: DC | PRN
  Administered 2023-05-21 (×2): 1 mg via INTRAVENOUS

## 2023-05-21 MED ORDER — ONDANSETRON HCL 4 MG/2ML IJ SOLN: 4.0000 mg | Freq: Four times a day (QID) | INTRAMUSCULAR | Status: DC | PRN

## 2023-05-21 MED ORDER — MENTHOL 3 MG MT LOZG: 1.0000 | LOZENGE | OROMUCOSAL | Status: DC | PRN

## 2023-05-21 MED ORDER — PHENYLEPHRINE HCL-NACL 20-0.9 MG/250ML-% IV SOLN
INTRAVENOUS | Status: DC | PRN
  Administered 2023-05-21: 50 ug/min via INTRAVENOUS
  Administered 2023-05-21 (×2): 80 ug via INTRAVENOUS
  Administered 2023-05-21: 160 ug via INTRAVENOUS

## 2023-05-21 MED ORDER — 0.9 % SODIUM CHLORIDE (POUR BTL) OPTIME
TOPICAL | Status: DC | PRN
Start: 2023-05-21 — End: 2023-05-21
  Administered 2023-05-21: 400 mL

## 2023-05-21 MED ORDER — PHENOL 1.4 % MT LIQD: 1.0000 | OROMUCOSAL | Status: DC | PRN

## 2023-05-21 MED ORDER — INSULIN ASPART 100 UNIT/ML IJ SOLN
0.0000 [IU] | Freq: Three times a day (TID) | INTRAMUSCULAR | Status: DC
  Administered 2023-05-22: 3 [IU] via SUBCUTANEOUS
  Administered 2023-05-22: 5 [IU] via SUBCUTANEOUS
  Administered 2023-05-22: 3 [IU] via SUBCUTANEOUS

## 2023-05-21 MED ORDER — ROSUVASTATIN CALCIUM 20 MG PO TABS
20.0000 mg | ORAL_TABLET | Freq: Every day | ORAL | Status: DC
  Administered 2023-05-22: 20 mg via ORAL
  Filled 2023-05-21: qty 1

## 2023-05-21 MED ORDER — GABAPENTIN 300 MG PO CAPS
600.0000 mg | ORAL_CAPSULE | Freq: Two times a day (BID) | ORAL | Status: DC
  Administered 2023-05-21 – 2023-05-22 (×3): 600 mg via ORAL
  Filled 2023-05-21 (×3): qty 2

## 2023-05-21 MED ORDER — SEMAGLUTIDE (2 MG/DOSE) 8 MG/3ML ~~LOC~~ SOPN: 2.0000 mg | PEN_INJECTOR | SUBCUTANEOUS | Status: DC

## 2023-05-21 MED ORDER — FENTANYL CITRATE (PF) 250 MCG/5ML IJ SOLN
INTRAMUSCULAR | Status: DC | PRN
  Administered 2023-05-21 (×3): 25 ug via INTRAVENOUS

## 2023-05-21 MED ORDER — LIDOCAINE-EPINEPHRINE 1 %-1:100000 IJ SOLN
INTRAMUSCULAR | Status: DC | PRN
  Administered 2023-05-21: 10 mL

## 2023-05-21 MED ORDER — NORTRIPTYLINE HCL 10 MG PO CAPS
20.0000 mg | ORAL_CAPSULE | Freq: Every day | ORAL | Status: DC
  Administered 2023-05-21: 20 mg via ORAL
  Filled 2023-05-21 (×2): qty 2

## 2023-05-21 MED ORDER — ORAL CARE MOUTH RINSE: 15.0000 mL | Freq: Once | OROMUCOSAL | Status: AC

## 2023-05-21 MED ORDER — DEXAMETHASONE SODIUM PHOSPHATE 10 MG/ML IJ SOLN
INTRAMUSCULAR | Status: DC | PRN
  Administered 2023-05-21: 10 mg via INTRAVENOUS

## 2023-05-21 MED ORDER — ONDANSETRON 4 MG PO TBDP: 4.0000 mg | ORAL_TABLET | Freq: Three times a day (TID) | ORAL | Status: DC | PRN

## 2023-05-21 MED ORDER — NALOXONE HCL 4 MG/0.1ML NA LIQD: 1.0000 | Freq: Once | NASAL | Status: DC

## 2023-05-21 MED ORDER — PANTOPRAZOLE SODIUM 40 MG IV SOLR: 40.0000 mg | Freq: Every day | INTRAVENOUS | Status: DC

## 2023-05-21 MED ORDER — BUPIVACAINE LIPOSOME 1.3 % IJ SUSP
INTRAMUSCULAR | Status: AC
  Filled 2023-05-21: qty 20

## 2023-05-21 MED ORDER — MEPERIDINE HCL 25 MG/ML IJ SOLN: 6.2500 mg | INTRAMUSCULAR | Status: DC | PRN

## 2023-05-21 MED ORDER — ACETAMINOPHEN 325 MG PO TABS
650.0000 mg | ORAL_TABLET | ORAL | Status: DC | PRN
  Administered 2023-05-21 – 2023-05-22 (×3): 650 mg via ORAL
  Filled 2023-05-21 (×3): qty 2

## 2023-05-21 MED ORDER — ALBUMIN HUMAN 5 % IV SOLN
INTRAVENOUS | Status: DC | PRN
Start: 2023-05-21 — End: 2023-05-21

## 2023-05-21 MED ORDER — CEFAZOLIN SODIUM 1 G IJ SOLR
INTRAMUSCULAR | Status: AC
  Filled 2023-05-21: qty 20

## 2023-05-21 MED ORDER — PROPOFOL 10 MG/ML IV BOLUS
INTRAVENOUS | Status: AC
  Filled 2023-05-21: qty 20

## 2023-05-21 MED ORDER — INSULIN ASPART 100 UNIT/ML IJ SOLN
0.0000 [IU] | Freq: Every day | INTRAMUSCULAR | Status: DC
  Administered 2023-05-21: 4 [IU] via SUBCUTANEOUS

## 2023-05-21 MED ORDER — UBROGEPANT 100 MG PO TABS: 100.0000 mg | ORAL_TABLET | Freq: Every day | ORAL | Status: DC | PRN

## 2023-05-21 MED ORDER — SODIUM CHLORIDE 0.9% FLUSH: 3.0000 mL | INTRAVENOUS | Status: DC | PRN

## 2023-05-21 MED ORDER — ETOMIDATE 2 MG/ML IV SOLN
INTRAVENOUS | Status: DC | PRN
Start: 2023-05-21 — End: 2023-05-21
  Administered 2023-05-21: 14 mg via INTRAVENOUS

## 2023-05-21 MED ORDER — NITROGLYCERIN 0.4 MG SL SUBL: 0.4000 mg | SUBLINGUAL_TABLET | SUBLINGUAL | Status: DC | PRN

## 2023-05-21 SURGICAL SUPPLY — 75 items
ADH SKN CLS APL DERMABOND .7 (GAUZE/BANDAGES/DRESSINGS) ×1
APL SKNCLS STERI-STRIP NONHPOA (GAUZE/BANDAGES/DRESSINGS) ×1
BAG COUNTER SPONGE SURGICOUNT (BAG) ×1 IMPLANT
BAG SPNG CNTER NS LX DISP (BAG) ×1
BASKET BONE COLLECTION (BASKET) ×1 IMPLANT
BENZOIN TINCTURE PRP APPL 2/3 (GAUZE/BANDAGES/DRESSINGS) ×1 IMPLANT
BLADE BONE MILL MEDIUM (MISCELLANEOUS) ×1 IMPLANT
BLADE CLIPPER SURG (BLADE) IMPLANT
BLADE SURG 11 STRL SS (BLADE) ×1 IMPLANT
BONE VIVIGEN FORMABLE 5.4CC (Bone Implant) ×1 IMPLANT
BUR CUTTER 7.0 ROUND (BURR) ×1 IMPLANT
BUR MATCHSTICK NEURO 3.0 LAGG (BURR) ×1 IMPLANT
CANISTER SUCT 3000ML PPV (MISCELLANEOUS) ×1 IMPLANT
CAP LOCKING THREADED (Cap) IMPLANT
CLSR STERI-STRIP ANTIMIC 1/2X4 (GAUZE/BANDAGES/DRESSINGS) IMPLANT
CNTNR URN SCR LID CUP LEK RST (MISCELLANEOUS) ×1 IMPLANT
CONT SPEC 4OZ STRL OR WHT (MISCELLANEOUS) ×1
COVER BACK TABLE 60X90IN (DRAPES) ×1 IMPLANT
DERMABOND ADVANCED .7 DNX12 (GAUZE/BANDAGES/DRESSINGS) ×1 IMPLANT
DRAPE C-ARM 42X72 X-RAY (DRAPES) ×1 IMPLANT
DRAPE C-ARMOR (DRAPES) IMPLANT
DRAPE HALF SHEET 40X57 (DRAPES) IMPLANT
DRAPE LAPAROTOMY 100X72X124 (DRAPES) ×1 IMPLANT
DRAPE SURG 17X23 STRL (DRAPES) ×1 IMPLANT
DRSG OPSITE 4X5.5 SM (GAUZE/BANDAGES/DRESSINGS) ×1 IMPLANT
DRSG OPSITE POSTOP 3X4 (GAUZE/BANDAGES/DRESSINGS) IMPLANT
DRSG OPSITE POSTOP 4X6 (GAUZE/BANDAGES/DRESSINGS) ×1 IMPLANT
DRSG OPSITE POSTOP 4X8 (GAUZE/BANDAGES/DRESSINGS) IMPLANT
DURAPREP 26ML APPLICATOR (WOUND CARE) ×1 IMPLANT
ELECT REM PT RETURN 9FT ADLT (ELECTROSURGICAL) ×1
ELECTRODE REM PT RTRN 9FT ADLT (ELECTROSURGICAL) ×1 IMPLANT
EVACUATOR 1/8 PVC DRAIN (DRAIN) IMPLANT
EVACUATOR 3/16 PVC DRAIN (DRAIN) ×1 IMPLANT
GAUZE 4X4 16PLY ~~LOC~~+RFID DBL (SPONGE) IMPLANT
GAUZE SPONGE 4X4 12PLY STRL (GAUZE/BANDAGES/DRESSINGS) ×1 IMPLANT
GLOVE BIO SURGEON STRL SZ7 (GLOVE) IMPLANT
GLOVE BIO SURGEON STRL SZ8 (GLOVE) ×2 IMPLANT
GLOVE BIOGEL PI IND STRL 7.0 (GLOVE) IMPLANT
GLOVE EXAM NITRILE XL STR (GLOVE) IMPLANT
GLOVE INDICATOR 8.5 STRL (GLOVE) ×2 IMPLANT
GOWN STRL REUS W/ TWL LRG LVL3 (GOWN DISPOSABLE) IMPLANT
GOWN STRL REUS W/ TWL XL LVL3 (GOWN DISPOSABLE) ×2 IMPLANT
GOWN STRL REUS W/TWL 2XL LVL3 (GOWN DISPOSABLE) IMPLANT
GOWN STRL REUS W/TWL LRG LVL3 (GOWN DISPOSABLE) ×2
GOWN STRL REUS W/TWL XL LVL3 (GOWN DISPOSABLE) ×2
GRAFT BNE MATRIX VG FRMBL MD 5 (Bone Implant) IMPLANT
KIT BASIN OR (CUSTOM PROCEDURE TRAY) ×1 IMPLANT
KIT GRAFTMAG DEL NEURO DISP (NEUROSURGERY SUPPLIES) IMPLANT
KIT TURNOVER KIT B (KITS) ×1 IMPLANT
MILL BONE PREP (MISCELLANEOUS) ×1 IMPLANT
NDL HYPO 21X1.5 SAFETY (NEEDLE) ×1 IMPLANT
NDL HYPO 25X1 1.5 SAFETY (NEEDLE) ×1 IMPLANT
NEEDLE HYPO 21X1.5 SAFETY (NEEDLE) ×1
NEEDLE HYPO 25X1 1.5 SAFETY (NEEDLE) ×1
NS IRRIG 1000ML POUR BTL (IV SOLUTION) ×1 IMPLANT
PACK LAMINECTOMY NEURO (CUSTOM PROCEDURE TRAY) ×1 IMPLANT
PAD ARMBOARD 7.5X6 YLW CONV (MISCELLANEOUS) ×3 IMPLANT
ROD 85MM SPINAL (Rod) IMPLANT
SCREW AMP MODULAR CREO 6.5X45 (Screw) IMPLANT
SCREW PA THRD CREO TULIP 5.5X4 (Head) IMPLANT
SPACER SUSTAIN RT TI 8X22X11 8 (Spacer) IMPLANT
SPACER SUSTAIN TI 8X22X11 15D (Spacer) IMPLANT
SPIKE FLUID TRANSFER (MISCELLANEOUS) ×1 IMPLANT
SPONGE SURGIFOAM ABS GEL 100 (HEMOSTASIS) ×1 IMPLANT
SPONGE T-LAP 4X18 ~~LOC~~+RFID (SPONGE) IMPLANT
STRIP CLOSURE SKIN 1/2X4 (GAUZE/BANDAGES/DRESSINGS) ×2 IMPLANT
SUT VIC AB 0 CT1 18XCR BRD8 (SUTURE) ×1 IMPLANT
SUT VIC AB 0 CT1 8-18 (SUTURE) ×2
SUT VIC AB 2-0 CT1 18 (SUTURE) ×1 IMPLANT
SUT VIC AB 4-0 PS2 27 (SUTURE) ×1 IMPLANT
SYR 20ML LL LF (SYRINGE) ×1 IMPLANT
TOWEL GREEN STERILE (TOWEL DISPOSABLE) ×1 IMPLANT
TOWEL GREEN STERILE FF (TOWEL DISPOSABLE) ×1 IMPLANT
TRAY FOLEY MTR SLVR 16FR STAT (SET/KITS/TRAYS/PACK) ×1 IMPLANT
WATER STERILE IRR 1000ML POUR (IV SOLUTION) ×1 IMPLANT

## 2023-05-21 NOTE — Anesthesia Procedure Notes (Signed)
Procedure Name: Intubation Date/Time: 05/21/2023 8:02 AM  Performed by: Cy Blamer, CRNAPre-anesthesia Checklist: Patient identified, Emergency Drugs available, Suction available and Patient being monitored Patient Re-evaluated:Patient Re-evaluated prior to induction Oxygen Delivery Method: Circle system utilized Preoxygenation: Pre-oxygenation with 100% oxygen Induction Type: IV induction Ventilation: Mask ventilation without difficulty Laryngoscope Size: Glidescope and 3 Grade View: Grade I Tube type: Oral Tube size: 7.5 mm Number of attempts: 1 Airway Equipment and Method: Video-laryngoscopy and Bite block Placement Confirmation: ETT inserted through vocal cords under direct vision, positive ETCO2 and breath sounds checked- equal and bilateral Secured at: 21 cm Tube secured with: Tape Dental Injury: Teeth and Oropharynx as per pre-operative assessment  Comments: Elective glidescope for H/O c-spine surgery

## 2023-05-21 NOTE — H&P (Signed)
Marissa [name] is an 47 y.o. female.   Chief Complaint: Back and left greater than right leg pain HPI: 47 year old female longstanding issues with her back previously undergone L5-S1 fusion 10 years ago presents with progressive worsening back and bilateral hip and leg pain.  Workup revealed segmental degeneration above her fusion at L3-4 and L4-5.  Due to the patient's progression of clinical syndrome imaging findings and failed conservative treatment I recommended decompressive laminectomies and interbody fusions at those 2 levels.  I extensively reviewed the risks and benefits of the operation with her as well as perioperative course expectations of outcome and alternatives to surgery and she understands and agrees to proceed forward.  Past Medical History:  Diagnosis Date   Allergy    Anemia    Anxiety    Arthritis    Back pain, chronic    Cardiomyopathy, dilated, nonischemic (HCC)    CHF (congestive heart failure) (HCC)    Cholelithiasis    Coronary artery disease    Depression    Diabetes mellitus without complication (HCC)    fasting cbg 50-140s   Dysrhythmia    tachycardia   GERD (gastroesophageal reflux disease)    otc meds   Headache(784.0)    Hypercholesteremia    Hyperlipemia    Hypertension    IDA (iron deficiency anemia) 09/29/2019   Insulin pump in place    pt had insulin pump but it is now removed (02-18-16)   MI, old    2006   Migraines    once/month maybe - can last up to two weeks   Myocardial infarction [facility] 2006   "due to medication"; no evidence of ischemia or infarction by nuclear stress test '11   Neck pain, chronic    Neuropathy    legs   Pneumonia 2015   [facility]   PONV (postoperative nausea and vomiting)    Prolonged QT interval syndrome    Renal insufficiency    Restless leg syndrome, controlled    Sepsis (HCC) 2016   secondary to surgery   Sleep apnea    does not use cpap since losing alot of weight   Stroke (HCC) 06/2021   Vertigo    nop  episodes 2020   Vision loss    due to diabetes   Wears contact lenses    Wears dentures    partial lower    Past Surgical History:  Procedure Laterality Date   ACHILLES TENDON SURGERY Right 04/22/2019   Procedure: [facility] PROCEDURE WITH SUTURE ANCHOR;  Surgeon: Rosetta Posner, DPM;  Location: [facility];  Service: Podiatry;  Laterality: Right;   APPENDECTOMY N/A 03/08/2018   Procedure: APPENDECTOMY;  Surgeon: Christeen Douglas, MD;  Location: [facility];  Service: Gynecology;  Laterality: N/A;  By Dr. Ara Kussmaul SURGERY  2011   Lumbar    BOWEL RESECTION N/A 03/08/2018   Procedure: SMALL BOWEL RESECTION;  Surgeon: Christeen Douglas, MD;  Location: [facility];  Service: Gynecology;  Laterality: N/A;   CARDIAC CATHETERIZATION  07/18/2004   50-60% mid LAD, minor luminal irregularities RCA, normal LM and CX, EF 50-55% [facility])   carpel tunnel Bilateral    CERVICAL FUSION  2006   CHOLECYSTECTOMY N/A 04/08/2015   Procedure: LAPAROSCOPIC CHOLECYSTECTOMY WITH INTRAOPERATIVE CHOLANGIOGRAM;  Surgeon: Lattie Haw, MD;  Location: [facility];  Service: General;  Laterality: N/A;   COLON RESECTION N/A 03/16/2018   Procedure: COLON RESECTION;  Surgeon: Lattie Haw, MD;  Location: [facility];  Service:  General;  Laterality: N/A;   COLONOSCOPY WITH PROPOFOL N/A 10/22/2019   Procedure: COLONOSCOPY WITH PROPOFOL;  Surgeon: Toledo, Boykin Nearing, MD;  Location: [facility];  Service: Gastroenterology;  Laterality: N/A;  [facility] DID RAPID TEST; COPY ON CHART   ESOPHAGOGASTRODUODENOSCOPY (EGD) WITH PROPOFOL N/A 10/22/2019   Procedure: ESOPHAGOGASTRODUODENOSCOPY (EGD) WITH PROPOFOL;  Surgeon: Toledo, Boykin Nearing, MD;  Location: [facility];  Service: Gastroenterology;  Laterality: N/A;   HAMMER TOE SURGERY Right 04/22/2019   Procedure: HAMMER TOE CORRECTION X 4;  Surgeon: Rosetta Posner, DPM;  Location: [facility];  Service: Podiatry;  Laterality: Right;  Diabetic - insulin and oral  meds   LAPAROSCOPIC GASTRECTOMY  05/31/2017   [facility], Dr. Smitty Cords   LUMBAR LAMINECTOMY/DECOMPRESSION MICRODISCECTOMY Right 05/07/2013   Procedure: Right Lumbar one-two laminectomy;  Surgeon: Mariam Dollar, MD;  Location: [facility];  Service: Neurosurgery;  Laterality: Right;   LUMBAR LAMINECTOMY/DECOMPRESSION MICRODISCECTOMY Left 10/29/2013   Procedure: Left Lumbar five-Sacral one Laminectomy;  Surgeon: Mariam Dollar, MD;  Location: [facility];  Service: Neurosurgery;  Laterality: Left;   LUMBAR WOUND DEBRIDEMENT N/A 01/25/2014   Procedure: LUMBAR WOUND DEBRIDEMENT;  Surgeon: Temple Pacini, MD;  Location: [facility];  Service: Neurosurgery;  Laterality: N/A;   LUMBAR WOUND DEBRIDEMENT N/A 02/25/2014   Procedure: LUMBAR WOUND DEBRIDEMENT;  Surgeon: Mariam Dollar, MD;  Location: [facility];  Service: Neurosurgery;  Laterality: N/A;   LYSIS OF ADHESION N/A 03/08/2018   Procedure: LYSIS OF ADHESION;  Surgeon: Christeen Douglas, MD;  Location: [facility];  Service: Gynecology;  Laterality: N/A;   SALPINGOOPHORECTOMY Bilateral    1 1997. 2nd 2001   SHOULDER ARTHROSCOPY WITH BICEPSTENOTOMY Left 03/24/2016   Procedure: shoulder arthroscopy with biceps TENOTOMY, removal loose body, limited synovectomy;  Surgeon: Erin Sons, MD;  Location: [facility];  Service: Orthopedics;  Laterality: Left;   shoulder sugery     7/17 rotator cuff   SMALL BOWEL REPAIR N/A 03/08/2018   Procedure: SMALL BOWEL REPAIR;  Surgeon: Christeen Douglas, MD;  Location: [facility];  Service: Gynecology;  Laterality: N/A;    Family History  Problem Relation Age of Onset   Depression Mother    Drug abuse Mother    Early death Mother    Hypertension Mother    Varicose Veins Mother    CVA Mother    Diabetes Father    Early death Father    Hyperlipidemia Father    Heart Problems Father        enlarged geart   Breast cancer Maternal Grandmother        23's   Breast cancer Maternal Aunt    Lung cancer Maternal Grandfather     Social History:  reports that she has never smoked. She has never been exposed to tobacco smoke. She has never used smokeless tobacco. She reports that she does not drink alcohol and does not use drugs.  Allergies:  Allergies  Allergen Reactions   Bactrim [Sulfamethoxazole-Trimethoprim] Nausea And Vomiting   Ciprofloxacin Swelling    Lip swelling    Vancomycin Nausea And Vomiting   Doxycycline Diarrhea and Nausea Only   Erythromycin Diarrhea and Itching        Nsaids     Avoids due to gastric bypass   Amoxicillin Nausea And Vomiting and Rash    Patient tolerated Zosyn during the whole admission   Azithromycin Itching and Rash   Cefotaxime Nausea And Vomiting and Rash   Ceftriaxone Nausea And Vomiting  Food Itching and Rash    "[place] yogurt"    Medications Prior to Admission  Medication Sig Dispense Refill   acetaminophen (TYLENOL) 500 MG tablet Take 1,000 mg by mouth 2 (two) times daily as needed for moderate pain or headache.     BIOTIN PO Take 1 tablet by mouth daily.     estradiol (ESTRACE) 0.1 MG/GM vaginal cream Place 1 Applicatorful vaginally at bedtime.     gabapentin (NEURONTIN) 600 MG tablet Take 600 mg by mouth See admin instructions. Take 600 mg in the morning, 600 mg in the evening, and 1200 mg at bedtime     insulin aspart (NOVOLOG) 100 UNIT/ML injection Inject 2-5 Units into the skin 3 (three) times daily before meals.     nortriptyline (PAMELOR) 10 MG capsule Take 20 mg by mouth at bedtime.     ondansetron (ZOFRAN-ODT) 4 MG disintegrating tablet Take 1 tablet (4 mg total) by mouth every 8 (eight) hours as needed for nausea or vomiting. 20 tablet 0   oxycodone (ROXICODONE) 30 MG immediate release tablet Take 15 mg by mouth every 3 (three) hours as needed for pain.     OZEMPIC, 2 MG/DOSE, 8 MG/3ML SOPN Inject 2 mg into the skin every Wednesday.     rosuvastatin (CRESTOR) 20 MG tablet Take 20 mg by mouth daily.     tiZANidine (ZANAFLEX) 4 MG tablet Take 4 mg by  mouth 3 (three) times daily as needed for muscle spasms.     UBRELVY 100 MG TABS Take 100 mg by mouth daily as needed (migraines).     NARCAN 4 MG/0.1ML LIQD nasal spray kit Place 1 spray into the nose once. If no response after 2 minutes repeat in opposite nostril.  0   nitroGLYCERIN (NITROSTAT) 0.4 MG SL tablet Place 0.4 mg under the tongue every 5 (five) minutes as needed for chest pain.      Results for orders placed or performed during the hospital encounter of 05/21/23 (from the past 48 hour(s))  Glucose, capillary     Status: None   Collection Time: 05/21/23  5:57 AM  Result Value Ref Range   Glucose-Capillary 81 70 - 99 mg/dL    Comment: Glucose reference range applies only to samples taken after fasting for at least 8 hours.   No results found.  Review of Systems  Musculoskeletal:  Positive for back pain.  Neurological:  Positive for numbness.    Blood pressure 119/81, pulse 90, temperature 98.5 F (36.9 C), temperature source Oral, resp. rate 18, height 5\' 4"  (1.626 m), weight 70.3 kg, last menstrual period 12/21/2014, SpO2 100%. Physical Exam HENT:     Head: Normocephalic.     Right Ear: Tympanic membrane normal.     Nose: Nose normal.     Mouth/Throat:     Mouth: Mucous membranes are moist.  Eyes:     Pupils: Pupils are equal, round, and reactive to light.  Cardiovascular:     Rate and Rhythm: Normal rate.  Pulmonary:     Effort: Pulmonary effort is normal.  Abdominal:     General: Abdomen is flat.  Musculoskeletal:        General: Normal range of motion.     Cervical back: Normal range of motion.  Neurological:     Mental Status: She is alert.     Comments: Strength is 5 out of 5 iliopsoas, quads, hamstrings, gastroc, into tibialis, and EHL.  She has slight gastroc weakness on the left..  This  about 4 to 4+ out of 5      Assessment/Plan 47 year old female presents for decompressive laminectomies interbody fusions L3-4 L4-5  Mariam Dollar, MD 05/21/2023,  7:23 AM

## 2023-05-21 NOTE — Transfer of Care (Signed)
Immediate Anesthesia Transfer of Care Note  Patient: Marissa [name]  Procedure(s) Performed: POSTERIOR LUMBAR INTERBODY FUSION - LUMBAR THREE -LUMBAR FOUR  - LUMBAR FOUR - LUMBAR FIVE  with removal of hardware lumbar five- sacral one  - Posterior Lateral and Interbody fusion (Back)  Patient Location: PACU  Anesthesia Type:General  Level of Consciousness: awake, alert , and oriented  Airway & Oxygen Therapy: Patient Spontanous Breathing and Patient connected to face mask oxygen  Post-op Assessment: Report given to RN, Post -op Vital signs reviewed and stable, Patient moving all extremities X 4, and Patient able to stick tongue midline  Post vital signs: Reviewed and stable  Last Vitals:  Vitals Value Taken Time  BP 145/91 05/21/23 1251  Temp 98.6   Pulse 97 05/21/23 1253  Resp 18 05/21/23 1253  SpO2 100 % 05/21/23 1253  Vitals shown include unfiled device data.  Last Pain:  Vitals:   05/21/23 0634  TempSrc:   PainSc: 0-No pain         Complications: No notable events documented.

## 2023-05-21 NOTE — Progress Notes (Signed)
CRNA and Anesthesiologist are aware of 2 dermal piercings in chest. Covered with tape

## 2023-05-21 NOTE — Op Note (Signed)
Preoperative diagnosis: Lumbar spinal stenosis L3-4 L4-5 degenerative disc disease and instability L3-4 L4-5.  Postoperative diagnosis: Same.  Procedure: #1 decompressive lumbar laminectomy L3-4 redo decompressive laminectomy L4-5 with complete facetectomies and radical foraminotomies of the L3, L4, and bilateral redo foraminotomies of the L5 nerve root.  This was all done in excess and requiring more work than would be needed with a standard interbody fusions with aggressive under biting of the superior tickling facets at both levels gain access lateral margin of disc bases..  2.  Posterior lumbar interbody fusion L3-4 L4-5 utilizing the globus insert and rotate titanium cages packed with locally harvested autograft mixed with Vivigen.  3.  Pedicle screw fixation utilizing the globus and globus Creo amp modular pedicle screw set tying into her old construct at L5-S1 with removal of rods and knots at L5-S1 and extension rod.  4.  Posterolateral arthrodesis L3-4 L4-5 utilizing the locally harvested autograft mixed with Vivigen.  Surgeon: Donalee Citrin.  Assistant: Julien Girt.  Anesthesia: General.  EBL: Minimal.  HPI: 47 year old female who previous undergone L5-S1 fusion presented with progressive worsening back and bilateral hip and leg pain workup revealed segmental degeneration above her fusion at L3-4 and L4-5 old construct was solid L5-S1 and do the patient progression of clinical syndrome imaging findings of a conservative treatment I recommended exploration fusion move of hardware and extension with decompression stabilization procedures at L3-4 and L4-5.  I extensively reviewed the risks and benefits of the operation with her as well as perioperative course expectations of outcome and alternatives of surgery and she understood and agreed to proceed forward.  Operative procedure: Patient was brought into the OR was induced under general anesthesia positioned prone the Wilson frame her  back was prepped and draped in routine sterile fashion.  Her old incision was opened up and extended cephalad subperiosteal dissection was carried lamina of L3-L4 and L5 and exposed hardware at L5-S1.  Interoperative x-ray did confirm to defecation appropriate level so then the scar tissue was dissected free I removed the L4 and L3 spinous processes drilled the facets down freed up the scar tissue and start performing a central decompression.  Performed complete central decompressive laminectomies at L3 and L4 with complete medial facetectomies at 3 4 and 4 5 working through the scar tissue and performing radical foraminotomies of the L5 nerve roots.  I then aggressively under bit the superior tickling facet and decompress the L3 and L4 nerve roots as well gaining access to the lateral margin of the disc base.  The space was then incised cleaned out bilaterally endplates were prepared a lot of central disc was removed sized up 8-11 8 mm correction 8 degree cages at L3-4 15 degree cages at L4-5 and both cages were inserted under fluoroscopy with an extensive mount of autograft material within the cage and centrally between the cages.  Then pedicle screws were placed at L3 and L4 bilaterally under fluoroscopy.  I then disconnected the hardware remove the rods 5 1 did appear to be solid contoured 2 additional 85 mm rods copiously irrigated the wound aggressively decorticated the TPs and lateral facets at L3-4 and L4-5 and packed and extensive mount of autograft mix material posterior laterally along the facets and lateral facet joints.  And along the TPs.  Then the heads were assembled on the L3 and L4 screws rods were placed at L4 was compressed against L5 and L3 was compressed against L4 I then explored all the foramina to confirm patency  and no migration of graft material everything was anchored down and tightened in place and then Gelfoam was awaiting top of the dura a medium Hemovac drain was placed Exparel was  injected in the fascia and the wound was closed in layers with active Vicryl and a running 4 subcuticular.  Dermabond benzoin Steri-Strips and sterile dressing was applied patient recovery in stable condition.  At the end the case all needle count sponge counts were correct.

## 2023-05-21 NOTE — Anesthesia Postprocedure Evaluation (Signed)
Anesthesia Post Note  Patient: TAMANA HATFIELD  Procedure(s) Performed: POSTERIOR LUMBAR INTERBODY FUSION - LUMBAR THREE -LUMBAR FOUR  - LUMBAR FOUR - LUMBAR FIVE  with removal of hardware lumbar five- sacral one  - Posterior Lateral and Interbody fusion (Back)     Patient location during evaluation: PACU Anesthesia Type: General Level of consciousness: awake and alert Pain management: pain level controlled Vital Signs Assessment: post-procedure vital signs reviewed and stable Respiratory status: spontaneous breathing, nonlabored ventilation, respiratory function stable and patient connected to nasal cannula oxygen Cardiovascular status: blood pressure returned to baseline and stable Postop Assessment: no apparent nausea or vomiting Anesthetic complications: no  No notable events documented.  Last Vitals:  Vitals:   05/21/23 1348 05/21/23 1551  BP: 119/75 115/78  Pulse: (!) 108 100  Resp: 18 20  Temp:  36.6 C  SpO2: 96% 98%    Last Pain:  Vitals:   05/21/23 1551  TempSrc: Oral  PainSc:                  Shelton Silvas

## 2023-05-21 NOTE — Progress Notes (Signed)
Pt placed contacts and partial

## 2023-05-22 DIAGNOSIS — M48061 Spinal stenosis, lumbar region without neurogenic claudication: Secondary | ICD-10-CM | POA: Diagnosis not present

## 2023-05-22 LAB — GLUCOSE, CAPILLARY
Glucose-Capillary: 184 mg/dL — ABNORMAL HIGH (ref 70–99)
Glucose-Capillary: 177 mg/dL — ABNORMAL HIGH (ref 70–99)
Glucose-Capillary: 244 mg/dL — ABNORMAL HIGH (ref 70–99)
Glucose-Capillary: 151 mg/dL — ABNORMAL HIGH (ref 70–99)

## 2023-05-22 LAB — HEMOGLOBIN AND HEMATOCRIT, BLOOD
HCT: 26.4 % — ABNORMAL LOW (ref 36.0–46.0)
Hemoglobin: 8.1 g/dL — ABNORMAL LOW (ref 12.0–15.0)

## 2023-05-22 MED ORDER — NALOXONE HCL 0.4 MG/ML IJ SOLN: 0.4000 mg | INTRAMUSCULAR | Status: DC | PRN

## 2023-05-22 MED ORDER — SODIUM CHLORIDE 0.9 % IV BOLUS
500.0000 mL | Freq: Once | INTRAVENOUS | Status: AC
  Administered 2023-05-22: 500 mL via INTRAVENOUS

## 2023-05-22 MED ORDER — OXYCODONE HCL 30 MG PO TABS: 15.0000 mg | ORAL_TABLET | ORAL | 0 refills | Status: AC | PRN

## 2023-05-22 MED ORDER — TIZANIDINE HCL 4 MG PO TABS: 4.0000 mg | ORAL_TABLET | Freq: Three times a day (TID) | ORAL | 0 refills | Status: AC | PRN

## 2023-05-22 MED ORDER — NALOXONE HCL 0.4 MG/ML IJ SOLN
INTRAMUSCULAR | Status: AC
  Administered 2023-05-22: 0.4 mg
  Filled 2023-05-22: qty 1

## 2023-05-22 NOTE — Significant Event (Addendum)
Rapid Response Event Note   Reason for Call :  Decreased LOC, SBP-60s  Pt given 500cc NS bolus given PTA RRT.   Initial Focused Assessment:  Pt lying in bed with eyes closed. She will open her eyes and answer questions. She is oriented x 4 once awake. Her speech is slurred. She will move all extremities. She says she is having 7/10 pain in her back. She goes back to sleep very easily with no stimulation. Pupils 2 and equal. Lungs clear t/o. Skin warm cool to touch, dry.   HR-83, SBP-70s, RR-9, SpO2-98% on RA  Pt received oxycodone 15mg  given at 0506.   Narcan given. Patient's MS improving and speech is clear.  BP-108/75, RR-16.   Interventions:  CBG-184 Narcan 0.4mg  IV H/H Tx to tele bed Plan of Care:  Pt more awake after narcan. Obtain H/H and await results. Tx to 6E for closer monitoring. Please call RRT if further assistance needed.   Event Summary:   MD Notified: Meyran Call [phone] Arrival Time:0600 End [id number]  Terrilyn Saver, RN

## 2023-05-22 NOTE — Evaluation (Signed)
Occupational Therapy Evaluation Patient Details Name: Marissa [name] MRN: [id number] DOB: 04-28-1976 Today's Date: 05/22/2023   History of Present Illness Pt is a 47 y.o. female s/p PLIF L3-5 with removal of hardware L5-S1. PMH significant for CHF, DM, HLD, HTN, MI, CVA,.   Clinical Impression   PTA, pt lived with her significant other and was mod I for ADL, IADL, working, and driving. Upon eval, pt performing UB ADL with set-up and LB ADL with up to min A without AE. Pt educated and demonstrating use of compensatory techniques and AE for bed mobility, brace application, LB ADL, grooming, toileting, and shower transfers within precautions. Will follow acutely to optimize safety and independence; do not suspect need for follow up therapy after discharge.        If plan is discharge home, recommend the following: A little help with walking and/or transfers;A little help with bathing/dressing/bathroom;Assistance with cooking/housework;Assist for transportation;Help with stairs or ramp for entrance    Functional Status Assessment  Patient has had a recent decline in their functional status and demonstrates the ability to make significant improvements in function in a reasonable and predictable amount of time.  Equipment Recommendations  None recommended by OT    Recommendations for Other Services       Precautions / Restrictions Precautions Precautions: Back Precaution Booklet Issued: Yes (comment) Precaution Comments: All precautions reviewed within the context of ADL Required Braces or Orthoses: Spinal Brace Spinal Brace: Lumbar corset Restrictions Weight Bearing Restrictions: No      Mobility Bed Mobility Overal bed mobility: Modified Independent             General bed mobility comments: increaesd time; good demo of log roll    Transfers Overall transfer level: Needs assistance Equipment used: None Transfers: Sit to/from Stand Sit to Stand: Supervision            General transfer comment: supervision for rise and steady; up to CGA with dynamic mobility      Balance Overall balance assessment: Needs assistance Sitting-balance support: No upper extremity supported, Feet supported Sitting balance-Leahy Scale: Fair Sitting balance - Comments: statically; reliant on spinal support of recliner when attempting figure 4     Standing balance-Leahy Scale: Fair Standing balance comment: CGA in standing                           ADL either performed or assessed with clinical judgement   ADL Overall ADL's : Needs assistance/impaired Eating/Feeding: Modified independent;Sitting   Grooming: Contact guard assist;Standing   Upper Body Bathing: Set up;Sitting   Lower Body Bathing: Contact guard assist;Sit to/from stand   Upper Body Dressing : Set up;Sitting   Lower Body Dressing: Minimal assistance;Sit to/from stand Lower Body Dressing Details (indicate cue type and reason): educated regarding LB AE to optimize independence Toilet Transfer: Contact guard assist;Ambulation     Toileting - Clothing Manipulation Details (indicate cue type and reason): educated regarding compensatory techniques     Functional mobility during ADLs: Contact guard assist       Vision Patient Visual Report: No change from baseline Vision Assessment?: No apparent visual deficits     Perception Perception: Not tested       Praxis Praxis: Not tested       Pertinent Vitals/Pain Pain Assessment Pain Assessment: Faces Faces Pain Scale: Hurts little more Pain Location: back Pain Descriptors / Indicators: Aching, Discomfort Pain Intervention(s): Limited activity within patient's tolerance, Monitored during session  Extremity/Trunk Assessment Upper Extremity Assessment Upper Extremity Assessment: Overall WFL for tasks assessed   Lower Extremity Assessment Lower Extremity Assessment: Defer to PT evaluation   Cervical / Trunk  Assessment Cervical / Trunk Assessment: Back Surgery   Communication Communication Communication: No apparent difficulties Cueing Techniques: Verbal cues   Cognition Arousal: Alert Behavior During Therapy: WFL for tasks assessed/performed Overall Cognitive Status: Within Functional Limits for tasks assessed                                 General Comments: able to recall precautions on arrival with one indirect cue     General Comments  BP has been low with MAP of 97 on arrival and RN providing clearance and encouraging OT to mobilize the pt. Systolic BP dropped at EOB, but back to >90 after mobility    Exercises     Shoulder Instructions      Home Living Family/patient expects to be discharged to:: Private residence Living Arrangements: Spouse/significant other (significant other) Available Help at Discharge: Family;Available PRN/intermittently Type of Home: House Home Access: Stairs to enter [employer] of Steps: 7 Entrance Stairs-Rails: Right;Left;Can reach both Home Layout: One level     Bathroom Shower/Tub: Runner, broadcasting/film/video: Rollator (4 wheels);Shower seat - built in;Toilet riser;Adaptive equipment;Hand held shower head Adaptive Equipment: Reacher        Prior Functioning/Environment Prior Level of Function : Independent/Modified Independent;Driving;Working/employed               ADLs Comments: Per pt, 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        OT Problem List: Decreased strength;Impaired balance (sitting and/or standing);Decreased activity tolerance;Pain      OT Treatment/Interventions: Self-care/ADL training;Therapeutic exercise;DME and/or AE instruction;Balance training;Patient/family education;Therapeutic activities    OT Goals(Current goals can be found in the care plan section) Acute Rehab OT Goals Patient Stated Goal: get better OT Goal Formulation: With patient Time For Goal Achievement:  06/05/23 Potential to Achieve Goals: Good  OT Frequency: Min 1X/week    Co-evaluation              AM-PAC OT "6 Clicks" Daily Activity     Outcome Measure Help from another person eating meals?: None Help from another person taking care of personal grooming?: A Little Help from another person toileting, which includes using toliet, bedpan, or urinal?: A Little Help from another person bathing (including washing, rinsing, drying)?: A Little Help from another person to put on and taking off regular upper body clothing?: A Little Help from another person to put on and taking off regular lower body clothing?: A Little 6 Click Score: 19   End of Session Nurse Communication: Mobility status  Activity Tolerance: Patient tolerated treatment well Patient left: in chair;with call bell/phone within reach  OT Visit Diagnosis: Unsteadiness on feet (R26.81);Muscle weakness (generalized) (M62.81)                Time: 1026-1100 OT Time Calculation (min): 34 min Charges:  OT General Charges $OT Visit: 1 Visit OT Evaluation $OT Eval Low Complexity: 1 Low OT Treatments $Self Care/Home Management : 8-22 mins  Tyler Deis, OTR/L [facility] Acute Rehabilitation Office: [phone]   Myrla Halsted 05/22/2023, 11:21 AM

## 2023-05-22 NOTE — Discharge Summary (Signed)
Physician Discharge Summary  Patient ID: Marissa [name] MRN: [id number] DOB/AGE: 47-May-1977 47 y.o.  Admit date: 05/21/2023 Discharge date: 05/22/2023  Admission Diagnoses:  Lumbar spinal stenosis L3-4 L4-5 degenerative disc disease and instability L3-4 L4-5.      Discharge Diagnoses: same   Discharged Condition: good  Hospital Course: The patient was admitted on 05/21/2023 and taken to the operating room where the patient underwent PLIF L3-4, L4-5. The patient tolerated the procedure well and was taken to the recovery room and then to the floor in stable condition. The hospital course was routine. There were no complications. The wound remained clean dry and intact. Pt had appropriate back soreness. No complaints of leg pain or new N/T/W. The patient remained afebrile with stable vital signs, and tolerated a regular diet. The patient continued to increase activities, and pain was well controlled with oral pain medications.   Consults: None  Significant Diagnostic Studies:  Results for orders placed or performed during the hospital encounter of 05/21/23  Glucose, capillary  Result Value Ref Range   Glucose-Capillary 81 70 - 99 mg/dL  Glucose, capillary  Result Value Ref Range   Glucose-Capillary 207 (H) 70 - 99 mg/dL  Glucose, capillary  Result Value Ref Range   Glucose-Capillary 287 (H) 70 - 99 mg/dL  Glucose, capillary  Result Value Ref Range   Glucose-Capillary 315 (H) 70 - 99 mg/dL   Comment 1 Notify RN    Comment 2 Document in Chart   Hemoglobin and hematocrit, blood  Result Value Ref Range   Hemoglobin 8.1 (L) 12.0 - 15.0 g/dL   HCT 40.9 (L) 81.1 - 91.4 %  Glucose, capillary  Result Value Ref Range   Glucose-Capillary 184 (H) 70 - 99 mg/dL   Comment 1 Notify RN    Comment 2 Document in Chart   Glucose, capillary  Result Value Ref Range   Glucose-Capillary 177 (H) 70 - 99 mg/dL  Glucose, capillary  Result Value Ref Range   Glucose-Capillary 244 (H) 70 - 99 mg/dL   Glucose, capillary  Result Value Ref Range   Glucose-Capillary 151 (H) 70 - 99 mg/dL    DG Lumbar Spine 2-3 Views  Result Date: 05/21/2023 CLINICAL DATA:  Elective surgery. EXAM: LUMBAR SPINE - 2-3 VIEW COMPARISON:  12/04/2022 FINDINGS: Two fluoroscopic spot views of the lumbar spine obtained in the operating room. Previous L5-S1 fusion hardware pedicle screws at L4 and L3. Interbody spacers. Fluoroscopy time 50 seconds. Dose 37.06 mGy. IMPRESSION: Intraoperative fluoroscopy during lumbar surgery. Electronically Signed   By: Narda Rutherford M.D.   On: 05/21/2023 14:56   DG C-Arm 1-60 Min-No Report  Result Date: 05/21/2023 Fluoroscopy was utilized by the requesting physician.  No radiographic interpretation.   DG C-Arm 1-60 Min-No Report  Result Date: 05/21/2023 Fluoroscopy was utilized by the requesting physician.  No radiographic interpretation.   DG C-Arm 1-60 Min-No Report  Result Date: 05/21/2023 Fluoroscopy was utilized by the requesting physician.  No radiographic interpretation.    Antibiotics:  Anti-infectives (From admission, onward)    Start     Dose/Rate Route Frequency Ordered Stop   05/21/23 1630  ceFAZolin (ANCEF) IVPB 2g/100 mL premix        2 g 200 mL/hr over 30 Minutes Intravenous Every 8 hours 05/21/23 1343 05/23/23 1629   05/21/23 0600  vancomycin (VANCOCIN) IVPB 1000 mg/200 mL premix        1,000 mg 200 mL/hr over 60 Minutes Intravenous On call to O.R. 05/21/23 0550 05/21/23  4696       Discharge Exam: Blood pressure 109/74, pulse 100, temperature 99.4 F (37.4 C), temperature source Oral, resp. rate 18, height 5\' 4"  (1.626 m), weight 74.7 kg, last menstrual period 12/21/2014, SpO2 100%. Neurologic: Grossly normal Ambulating and voiding well incision cdi   Discharge Medications:   Allergies as of 05/22/2023       Reactions   Bactrim [sulfamethoxazole-trimethoprim] Nausea And Vomiting   Ciprofloxacin Swelling   Lip swelling   Vancomycin Nausea And  Vomiting   05/21/23 tolerated   Doxycycline Diarrhea, Nausea Only   Erythromycin Diarrhea, Itching      Nsaids    Avoids due to gastric bypass   Amoxicillin Nausea And Vomiting, Rash   Patient tolerated Zosyn during the whole admission; 05/21/23 tolerated cefazolin   Azithromycin Itching, Rash   Cefotaxime Nausea And Vomiting, Rash   Ceftriaxone Nausea And Vomiting   Food Itching, Rash   "Greek yogurt"        Medication List     TAKE these medications    acetaminophen 500 MG tablet Commonly known as: TYLENOL Take 1,000 mg by mouth 2 (two) times daily as needed for moderate pain or headache.   BIOTIN PO Take 1 tablet by mouth daily.   estradiol 0.1 MG/GM vaginal cream Commonly known as: ESTRACE Place 1 Applicatorful vaginally at bedtime.   gabapentin 600 MG tablet Commonly known as: NEURONTIN Take 600 mg by mouth See admin instructions. Take 600 mg in the morning, 600 mg in the evening, and 1200 mg at bedtime   insulin aspart 100 UNIT/ML injection Commonly known as: novoLOG Inject 2-5 Units into the skin 3 (three) times daily before meals.   Narcan 4 MG/0.1ML Liqd nasal spray kit Generic drug: naloxone Place 1 spray into the nose once. If no response after 2 minutes repeat in opposite nostril.   nitroGLYCERIN 0.4 MG SL tablet Commonly known as: NITROSTAT Place 0.4 mg under the tongue every 5 (five) minutes as needed for chest pain.   nortriptyline 10 MG capsule Commonly known as: PAMELOR Take 20 mg by mouth at bedtime.   ondansetron 4 MG disintegrating tablet Commonly known as: ZOFRAN-ODT Take 1 tablet (4 mg total) by mouth every 8 (eight) hours as needed for nausea or vomiting.   oxycodone 30 MG immediate release tablet Commonly known as: ROXICODONE Take 0.5 tablets (15 mg total) by mouth every 3 (three) hours as needed for pain.   Ozempic (2 MG/DOSE) 8 MG/3ML Sopn Generic drug: Semaglutide (2 MG/DOSE) Inject 2 mg into the skin every Wednesday.    rosuvastatin 20 MG tablet Commonly known as: CRESTOR Take 20 mg by mouth daily.   tiZANidine 4 MG tablet Commonly known as: ZANAFLEX Take 1 tablet (4 mg total) by mouth 3 (three) times daily as needed for muscle spasms.   Ubrelvy 100 MG Tabs Generic drug: Ubrogepant Take 100 mg by mouth daily as needed (migraines).        Disposition: home   Final Dx: PLIF L3-4, L4-5  Discharge Instructions      Remove dressing in 72 hours   Complete by: As directed    Call MD for:  difficulty breathing, headache or visual disturbances   Complete by: As directed    Call MD for:  hives   Complete by: As directed    Call MD for:  persistant dizziness or light-headedness   Complete by: As directed    Call MD for:  persistant nausea and vomiting   Complete  by: As directed    Call MD for:  redness, tenderness, or signs of infection (pain, swelling, redness, odor or green/yellow discharge around incision site)   Complete by: As directed    Call MD for:  severe uncontrolled pain   Complete by: As directed    Call MD for:  temperature >100.4   Complete by: As directed    Diet - low sodium heart healthy   Complete by: As directed    Driving Restrictions   Complete by: As directed    No driving for 2 weeks, no riding in the car for 1 week   Increase activity slowly   Complete by: As directed    Lifting restrictions   Complete by: As directed    No lifting more than 8 lbs          Signed: Tiana Loft Shalise Rosado 05/22/2023, 4:47 PM

## 2023-05-22 NOTE — Evaluation (Signed)
Physical Therapy Evaluation Patient Details Name: Marissa [name] MRN: [id number] DOB: 10-03-1975 Today's Date: 05/22/2023  History of Present Illness  Pt is a 47 y.o. female s/p PLIF L3-5 with removal of hardware L5-S1. PMH significant for CHF, DM, HLD, HTN, MI, CVA,.   Clinical Impression  Pt admitted with above. Pt mobilizing at contact guard A with use of RW. Pt appreciative of education. Pt to benefit from RW at this time to minimize strain on back, achieve upright posture during ambulation and minimize pain.  Pt appreciative of education and use of RW reporting her back feels better walking. Acute PT to follow and complete stair negotiation next session however I don't anticipate pt to need PT post d/c.      If plan is discharge home, recommend the following: Assist for transportation;Help with stairs or ramp for entrance   Can travel by private vehicle        Equipment Recommendations Rolling walker (2 wheels)  Recommendations for Other Services       Functional Status Assessment Patient has had a recent decline in their functional status and demonstrates the ability to make significant improvements in function in a reasonable and predictable amount of time.     Precautions / Restrictions Precautions Precautions: Back Precaution Booklet Issued: Yes (comment) Precaution Comments: All precautions reviewed within the context of ADL Required Braces or Orthoses: Spinal Brace Spinal Brace: Lumbar corset Restrictions Weight Bearing Restrictions: No      Mobility  Bed Mobility Overal bed mobility: Modified Independent             General bed mobility comments: increaesd time; good demo of log roll to return to supine in bed    Transfers Overall transfer level: Needs assistance Equipment used: None Transfers: Sit to/from Stand Sit to Stand: Supervision           General transfer comment: supervision for rise and steady; up to CGA with dynamic mobility     Ambulation/Gait Ambulation/Gait assistance: Supervision Gait Distance (Feet): 200 Feet Assistive device: Rolling walker (2 wheels) Gait Pattern/deviations: Step-through pattern, Decreased stride length Gait velocity: dec Gait velocity interpretation: 1.31 - 2.62 ft/sec, indicative of limited community ambulator   General Gait Details: pt initially began ambulating without AD however pt with increased trunk flexion, back pain, and short step length/height/step to pattern. Pt given RW to help off weight back to aide in pain management and improve gait kinematics. Although initally resistant pt  reports "oh this is so much better. I can stand up straight" pt with improved step length and height and ability to maintain upright posture. pt was able to ambulate greater distance with RW compared to no AD  Stairs            Wheelchair Mobility     Tilt Bed    Modified Rankin (Stroke Patients Only)       Balance Overall balance assessment: Needs assistance Sitting-balance support: No upper extremity supported, Feet supported Sitting balance-Leahy Scale: Fair Sitting balance - Comments: statically;     Standing balance-Leahy Scale: Fair Standing balance comment: CGA in standing                             Pertinent Vitals/Pain Pain Assessment Pain Assessment: Faces Faces Pain Scale: Hurts even more Pain Location: back Pain Descriptors / Indicators: Aching, Discomfort Pain Intervention(s): Monitored during session    Home Living Family/patient expects to be discharged to:: Private  residence Living Arrangements: Spouse/significant other (significant other) Available Help at Discharge: Family;Available PRN/intermittently Type of Home: House Home Access: Stairs to enter Entrance Stairs-Rails: Right;Left;Can reach both Entrance Stairs-Number of Steps: 7   Home Layout: One level Home Equipment: Rollator (4 wheels);Shower seat - built in;Toilet riser;Adaptive  equipment;Hand held shower head      Prior Function Prior Level of Function : Independent/Modified Independent;Driving;Working/employed               ADLs Comments: Per pt, 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     Extremity/Trunk Assessment   Upper Extremity Assessment Upper Extremity Assessment: Overall WFL for tasks assessed    Lower Extremity Assessment Lower Extremity Assessment: Generalized weakness (due to back pain)    Cervical / Trunk Assessment Cervical / Trunk Assessment: Back Surgery  Communication   Communication Communication: No apparent difficulties Cueing Techniques: Verbal cues  Cognition Arousal: Alert Behavior During Therapy: WFL for tasks assessed/performed Overall Cognitive Status: Within Functional Limits for tasks assessed                                 General Comments: able to recall precautions, open to education        General Comments General comments (skin integrity, edema, etc.): VSS, BP soft since surgery    Exercises     Assessment/Plan    PT Assessment Patient needs continued PT services  PT Problem List Decreased strength;Decreased activity tolerance;Decreased mobility;Pain       PT Treatment Interventions DME instruction;Gait training;Stair training;Functional mobility training;Therapeutic activities;Therapeutic exercise;Balance training    PT Goals (Current goals can be found in the Care Plan section)  Acute Rehab PT Goals Patient Stated Goal: minimize pain PT Goal Formulation: With patient Time For Goal Achievement: 06/05/23 Potential to Achieve Goals: Good    Frequency Min 5X/week     Co-evaluation               AM-PAC PT "6 Clicks" Mobility  Outcome Measure Help needed turning from your back to your side while in a flat bed without using bedrails?: A Little Help needed moving from lying on your back to sitting on the side of a flat bed without using bedrails?: A Little Help needed moving to  and from a bed to a chair (including a wheelchair)?: A Little Help needed standing up from a chair using your arms (e.g., wheelchair or bedside chair)?: A Little Help needed to walk in hospital room?: A Little Help needed climbing 3-5 steps with a railing? : A Little 6 Click Score: 18    End of Session Equipment Utilized During Treatment: Back brace Activity Tolerance: Patient tolerated treatment well Patient left: in bed;with call bell/phone within reach Nurse Communication: Mobility status PT Visit Diagnosis: Unsteadiness on feet (R26.81)    Time: 4696-2952 PT Time Calculation (min) (ACUTE ONLY): 27 min   Charges:   PT Evaluation $PT Eval Moderate Complexity: 1 Mod PT Treatments $Gait Training: 8-22 mins PT General Charges $$ ACUTE PT VISIT: 1 Visit         Lewis Shock, PT, DPT Acute Rehabilitation Services Secure chat preferred Office #: [phone]   Iona Hansen 05/22/2023, 2:27 PM

## 2023-05-22 NOTE — TOC CM/SW Note (Signed)
Transition of Care [facility]) - Inpatient Brief Assessment   Patient Details  Name: Marissa [name] MRN: [id number] Date of Birth: Jul 13, 1976  [facility]) CM/SW Contact:    Gala Lewandowsky, RN Phone Number: 05/22/2023, 3:35 PM   Clinical Narrative: Patient presented for back and right leg pain. Patient has insurance and PCP. Case Manager will continue to follow for transition of care needs as the patient progresses.    Transition of Care Asessment: Insurance and Status: Insurance coverage has been reviewed Patient has primary care physician: Yes Prior/Current Home Services: No current home services Social Determinants of Health Reivew: SDOH reviewed no interventions necessary Readmission risk has been reviewed: Yes Transition of care needs: no transition of care needs at this time

## 2023-05-22 NOTE — Progress Notes (Signed)
Subjective: Patient reports  significant improvement in leg pain condition of back soreness  Objective: Vital signs in last 24 hours: Temp:  [97.4 F (36.3 C)-99.6 F (37.6 C)] 99.6 F (37.6 C) (10/08 1106) Pulse Rate:  [79-129] 95 (10/08 1106) Resp:  [10-20] 16 (10/08 1127) BP: (67-115)/(46-78) 96/60 (10/08 1157) SpO2:  [90 %-100 %] 99 % (10/08 1106) Weight:  [74.7 kg] 74.7 kg (10/08 0710)  Intake/Output from previous day: 10/07 0701 - 10/08 0700 In: 838.2 [P.O.:240; I.V.:348.2; IV Piggyback:250] Out: 905 [Urine:405; Drains:300; Blood:200] Intake/Output this shift: No intake/output data recorded.  Strength 5/5 incision clean dry and intact  Lab Results: Recent Labs    05/22/23 0630  HGB 8.1*  HCT 26.4*   BMET No results for input(s): "NA", "K", "CL", "CO2", "GLUCOSE", "BUN", "CREATININE", "CALCIUM" in the last 72 hours.  Studies/Results: DG Lumbar Spine 2-3 Views  Result Date: 05/21/2023 CLINICAL DATA:  Elective surgery. EXAM: LUMBAR SPINE - 2-3 VIEW COMPARISON:  12/04/2022 FINDINGS: Two fluoroscopic spot views of the lumbar spine obtained in the operating room. Previous L5-S1 fusion hardware pedicle screws at L4 and L3. Interbody spacers. Fluoroscopy time 50 seconds. Dose 37.06 mGy. IMPRESSION: Intraoperative fluoroscopy during lumbar surgery. Electronically Signed   By: Narda Rutherford M.D.   On: 05/21/2023 14:56   DG C-Arm 1-60 Min-No Report  Result Date: 05/21/2023 Fluoroscopy was utilized by the requesting physician.  No radiographic interpretation.   DG C-Arm 1-60 Min-No Report  Result Date: 05/21/2023 Fluoroscopy was utilized by the requesting physician.  No radiographic interpretation.   DG C-Arm 1-60 Min-No Report  Result Date: 05/21/2023 Fluoroscopy was utilized by the requesting physician.  No radiographic interpretation.    Assessment/Plan:  Postop day 1 lumbar fusion doing very well making significant progress had some evidence and episodes of  hypotension last night possibly related to pain medications and seizure or some of her home med supplementing.  Unknown I also think that the current cough on her arm is not registering neck rib of pressure.  I asked the nurse to take a manual in check a portable I would like patient to work with physical therapy and she could potentially go home later today but more than likely in the morning.  LOS: 0 days     Mariam Dollar 05/22/2023, 3:23 PM

## 2023-05-31 MED FILL — Heparin Sodium (Porcine) Inj 1000 Unit/ML: INTRAMUSCULAR | Qty: 30 | Status: AC

## 2023-05-31 MED FILL — Sodium Chloride IV Soln 0.9%: INTRAVENOUS | Qty: 2000 | Status: AC

## 2023-06-07 NOTE — Plan of Care (Signed)
[facility] Tonsillectomy/Adenoidectomy, Postoperative PEDS care plan entered in error.

## 2023-06-18 NOTE — Progress Notes (Signed)
error 

## 2023-07-08 ENCOUNTER — Emergency Department
Admission: EM | Admit: 2023-07-08 | Discharge: 2023-07-09 | Disposition: A | Discharge status: Home / Self Care | Payer: 59 | Attending: Emergency Medicine | Admitting: Emergency Medicine

## 2023-07-08 ENCOUNTER — Other Ambulatory Visit: Payer: Self-pay

## 2023-07-08 DIAGNOSIS — Z20822 Contact with and (suspected) exposure to covid-19: Secondary | ICD-10-CM | POA: Diagnosis not present

## 2023-07-08 DIAGNOSIS — M7918 Myalgia, other site: Secondary | ICD-10-CM | POA: Diagnosis present

## 2023-07-08 DIAGNOSIS — M791 Myalgia, unspecified site: Secondary | ICD-10-CM

## 2023-07-08 NOTE — ED Provider Notes (Incomplete)
[facility] Provider Note    Event Date/Time   First MD Initiated Contact with Patient 07/08/23 2317     (approximate)   History   Back Pain   HPI {Remember to add pertinent medical, surgical, social, and/or OB history to HPI:1} Marissa [name] is a 47 y.o. female  ***       Physical Exam   Triage Vital Signs: ED Triage Vitals [07/08/23 2226]  Encounter Vitals Group     BP 93/67     Systolic BP Percentile      Diastolic BP Percentile      Pulse Rate 83     Resp 17     Temp 98.4 F (36.9 C)     Temp Source Oral     SpO2 96 %     Weight 155 lb (70.3 kg)     Height 5\' 4"  (1.626 m)     Head Circumference      Peak Flow      Pain Score 8     Pain Loc      Pain Education      Exclude from Growth Chart     Most recent vital signs: Vitals:   07/08/23 2226  BP: 93/67  Pulse: 83  Resp: 17  Temp: 98.4 F (36.9 C)  SpO2: 96%    {Only need to document appropriate and relevant physical exam:1} General: Awake, no distress. *** CV:  Good peripheral perfusion. *** Resp:  Normal effort. *** Abd:  No distention. *** Other:  ***   ED Results / Procedures / Treatments   Labs (all labs ordered are listed, but only abnormal results are displayed) Labs Reviewed - No data to display   EKG  ***   RADIOLOGY *** {USE THE WORD "INTERPRETED"!! You MUST document your own interpretation of imaging, as well as the fact that you reviewed the radiologist's report!:1}   PROCEDURES:  Critical Care performed: Yes  CRITICAL CARE Performed by: Phineas Semen   Total critical care time: *** minutes  Critical care time was exclusive of separately billable procedures and treating other patients.  Critical care was necessary to treat or prevent imminent or life-threatening deterioration.  Critical care was time spent personally by me on the following activities: development of treatment plan with patient and/or surrogate as well as  nursing, discussions with consultants, evaluation of patient's response to treatment, examination of patient, obtaining history from patient or surrogate, ordering and performing treatments and interventions, ordering and review of laboratory studies, ordering and review of radiographic studies, pulse oximetry and re-evaluation of patient's condition.   Procedures    MEDICATIONS ORDERED IN ED: Medications - No data to display   IMPRESSION / MDM / ASSESSMENT AND PLAN / ED COURSE  I reviewed the triage vital signs and the nursing notes.                              Differential diagnosis includes, but is not limited to, ***  Patient's presentation is most consistent with {EM COPA:27473}   ***The patient is on the cardiac monitor to evaluate for evidence of arrhythmia and/or significant heart rate changes.  ***      FINAL CLINICAL IMPRESSION(S) / ED DIAGNOSES   Final diagnoses:  None     Rx / DC Orders   ED Discharge Orders     None        Note:  This  document was prepared using Conservation officer, historic buildings and may include unintentional dictation errors.

## 2023-07-08 NOTE — ED Triage Notes (Signed)
Pt sts thajt she has been having back pain today. Pt recently had lumbar surgery a month ago and has not been able to get pain free. Pt sts that she took a muscle relaxer at 1830 today. Pt is very drowsy while in triage. Pt is alert to voice and orientated x4.

## 2023-07-08 NOTE — ED Provider Notes (Signed)
[facility] Provider Note    Event Date/Time   First MD Initiated Contact with Patient 07/08/23 2317     (approximate)   History   Body pain   HPI  Marissa [name] is a 47 y.o. female  who presents to the emergency department today because of concern for whole body pain. She states that it started suddenly earlier today. The patient denies any unusual activity or exertion prior to the pain starting. She did have lumbar surgery roughly 7 weeks ago and was concerned that her pain might be related to the surgery. She denies any fevers. No nausea or vomiting. No known sick contacts. Says she had pain like this once before a number of years ago but was not seen for it.      Physical Exam   Triage Vital Signs: ED Triage Vitals [07/08/23 2226]  Encounter Vitals Group     BP 93/67     Systolic BP Percentile      Diastolic BP Percentile      Pulse Rate 83     Resp 17     Temp 98.4 F (36.9 C)     Temp Source Oral     SpO2 96 %     Weight 155 lb (70.3 kg)     Height 5\' 4"  (1.626 m)     Head Circumference      Peak Flow      Pain Score 8     Pain Loc      Pain Education      Exclude from Growth Chart     Most recent vital signs: Vitals:   07/08/23 2226  BP: 93/67  Pulse: 83  Resp: 17  Temp: 98.4 F (36.9 C)  SpO2: 96%   General: Somnolent, awakens to physical stimuli. CV:  Good peripheral perfusion. Regular rate and rhythm. Resp:  Normal effort. Lungs clear. Abd:  No distention.  Other:  Lumbar surgical incision without erythema or warmth. Appears well healed.   ED Results / Procedures / Treatments   Labs (all labs ordered are listed, but only abnormal results are displayed) Labs Reviewed  CBC WITH DIFFERENTIAL/PLATELET - Abnormal; Notable for the following components:      Result Value   RBC 3.64 (*)    Hemoglobin 10.4 (*)    HCT 33.2 (*)    All other components within normal limits  BASIC METABOLIC PANEL - Abnormal; Notable for  the following components:   Glucose, Bld 112 (*)    Calcium 8.5 (*)    All other components within normal limits  RESP PANEL BY RT-PCR (RSV, FLU A&B, COVID)  RVPGX2     EKG  None   RADIOLOGY None   PROCEDURES:  Critical Care performed: No    MEDICATIONS ORDERED IN ED: Medications - No data to display   IMPRESSION / MDM / ASSESSMENT AND PLAN / ED COURSE  I reviewed the triage vital signs and the nursing notes.                              Differential diagnosis includes, but is not limited to, anemia, electrolyte abnormality, viral illness  Patient's presentation is most consistent with acute presentation with potential threat to life or bodily function.   Patient presented to the emergency department today because of concern for whole body pain. Will check blood work, COVID, influenza.  Blood work without significant electrolyte abnormality.  COVID and influenza negative.  Discussed results with patient.  Unclear etiology of the full body pains however I would be concerned for viral illness.  Patient did request discharge at this time.  I think this is reasonable.      FINAL CLINICAL IMPRESSION(S) / ED DIAGNOSES   Final diagnoses:  Myalgia     Note:  This document was prepared using Dragon voice recognition software and may include unintentional dictation errors.    Phineas Semen, MD 07/09/23 [phone]

## 2023-07-09 LAB — RESP PANEL BY RT-PCR (RSV, FLU A&B, COVID)  RVPGX2
Influenza A by PCR: NEGATIVE
Influenza B by PCR: NEGATIVE
Resp Syncytial Virus by PCR: NEGATIVE
SARS Coronavirus 2 by RT PCR: NEGATIVE

## 2023-07-09 LAB — CBC WITH DIFFERENTIAL/PLATELET
Abs Immature Granulocytes: 0.01 10*3/uL (ref 0.00–0.07)
Basophils Absolute: 0 10*3/uL (ref 0.0–0.1)
Basophils Relative: 0 %
Eosinophils Absolute: 0 10*3/uL (ref 0.0–0.5)
Eosinophils Relative: 1 %
HCT: 33.2 % — ABNORMAL LOW (ref 36.0–46.0)
Hemoglobin: 10.4 g/dL — ABNORMAL LOW (ref 12.0–15.0)
Immature Granulocytes: 0 %
Lymphocytes Relative: 30 %
Lymphs Abs: 1.2 10*3/uL (ref 0.7–4.0)
MCH: 28.6 pg (ref 26.0–34.0)
MCHC: 31.3 g/dL (ref 30.0–36.0)
MCV: 91.2 fL (ref 80.0–100.0)
Monocytes Absolute: 0.3 10*3/uL (ref 0.1–1.0)
Monocytes Relative: 8 %
Neutro Abs: 2.4 10*3/uL (ref 1.7–7.7)
Neutrophils Relative %: 61 %
Platelets: 193 10*3/uL (ref 150–400)
RBC: 3.64 MIL/uL — ABNORMAL LOW (ref 3.87–5.11)
RDW: 13.8 % (ref 11.5–15.5)
WBC: 4 10*3/uL (ref 4.0–10.5)
nRBC: 0 % (ref 0.0–0.2)

## 2023-07-09 LAB — BASIC METABOLIC PANEL
Anion gap: 8 (ref 5–15)
BUN: 15 mg/dL (ref 6–20)
CO2: 24 mmol/L (ref 22–32)
Calcium: 8.5 mg/dL — ABNORMAL LOW (ref 8.9–10.3)
Chloride: 105 mmol/L (ref 98–111)
Creatinine, Ser: 0.72 mg/dL (ref 0.44–1.00)
GFR, Estimated: >60 mL/min (ref >60)
Glucose, Bld: 112 mg/dL — ABNORMAL HIGH (ref 70–99)
Potassium: 3.7 mmol/L (ref 3.5–5.1)
Sodium: 137 mmol/L (ref 135–145)

## 2023-07-09 MED ORDER — HALOPERIDOL LACTATE 5 MG/ML IJ SOLN
5.0000 mg | Freq: Once | INTRAMUSCULAR | Status: AC
  Administered 2023-07-09: 5 mg via INTRAVENOUS
  Filled 2023-07-09: qty 1

## 2023-07-09 MED ORDER — OXYCODONE-ACETAMINOPHEN 7.5-325 MG PO TABS
1.0000 | ORAL_TABLET | Freq: Once | ORAL | Status: AC
  Administered 2023-07-09: 1 via ORAL
  Filled 2023-07-09: qty 1

## 2023-07-09 NOTE — Discharge Instructions (Signed)
Please seek medical attention for any high fevers, chest pain, shortness of breath, change in behavior, persistent vomiting, bloody stool or any other new or concerning symptoms.  

## 2023-08-24 ENCOUNTER — Emergency Department: Payer: 59

## 2023-08-24 ENCOUNTER — Emergency Department
Admission: EM | Admit: 2023-08-24 | Discharge: 2023-08-24 | Disposition: A | Discharge status: Home / Self Care | Payer: 59 | Attending: Emergency Medicine | Admitting: Emergency Medicine

## 2023-08-24 ENCOUNTER — Other Ambulatory Visit: Payer: Self-pay

## 2023-08-24 ENCOUNTER — Encounter: Payer: Self-pay | Admitting: Emergency Medicine

## 2023-08-24 DIAGNOSIS — M549 Dorsalgia, unspecified: Secondary | ICD-10-CM

## 2023-08-24 DIAGNOSIS — M542 Cervicalgia: Secondary | ICD-10-CM | POA: Diagnosis not present

## 2023-08-24 DIAGNOSIS — M546 Pain in thoracic spine: Secondary | ICD-10-CM | POA: Diagnosis not present

## 2023-08-24 DIAGNOSIS — M25512 Pain in left shoulder: Secondary | ICD-10-CM | POA: Insufficient documentation

## 2023-08-24 DIAGNOSIS — M545 Low back pain, unspecified: Secondary | ICD-10-CM | POA: Diagnosis not present

## 2023-08-24 DIAGNOSIS — W000XXA Fall on same level due to ice and snow, initial encounter: Secondary | ICD-10-CM | POA: Diagnosis not present

## 2023-08-24 DIAGNOSIS — W19XXXA Unspecified fall, initial encounter: Secondary | ICD-10-CM

## 2023-08-24 MED ORDER — CYCLOBENZAPRINE HCL 5 MG PO TABS: 5.0000 mg | ORAL_TABLET | Freq: Three times a day (TID) | ORAL | 0 refills | Status: DC | PRN

## 2023-08-24 MED ORDER — OXYCODONE-ACETAMINOPHEN 5-325 MG PO TABS
2.0000 | ORAL_TABLET | Freq: Once | ORAL | Status: AC
  Administered 2023-08-24: 2 via ORAL
  Filled 2023-08-24: qty 2

## 2023-08-24 MED ORDER — HYDROMORPHONE HCL 1 MG/ML IJ SOLN
1.0000 mg | Freq: Once | INTRAMUSCULAR | Status: AC
  Administered 2023-08-24: 1 mg via INTRAMUSCULAR
  Filled 2023-08-24: qty 1

## 2023-08-24 NOTE — Discharge Instructions (Signed)
 You were seen in the emergency department for a fall.  You had x-ray images that did not show any broken bones.  Continue to take your pain medication as prescribed.  You are also given a prescription for muscle relaxer.  You can use over-the-counter 4% Lidoderm  patches.  Follow-up closely with your primary care physician.  Return to the emergency department for any worsening symptoms.  Cyclbenzoprine (Flexeril ) - You can take 1/2 to 1 tablet (5-10 mg) every 8 hours as needed for muscle strain/spasm.  Do not drive or work on this medication.  This medication can cause you to feel tired.  Thank you for choosing us  for your health care, it was my pleasure to care for you today!  Clotilda Punter, MD

## 2023-08-24 NOTE — ED Provider Notes (Signed)
 [facility] Provider Note    Event Date/Time   First MD Initiated Contact with Patient 08/24/23 0515     (approximate)   History   Fall   HPI  Marissa [name] is a 48 y.o. female past medical history significant for prior back surgery who presents to the emergency department with back pain.  States that she slipped on ice tonight and fell backwards hitting her back and left side.  Complaining of pain to her upper back, left side of her neck, left shoulder, lower back.  Denies any nausea or vomiting.  No head injury or loss of consciousness.  Recent back surgery in October and states that she has been taking oxycodone  for that.  Not on anticoagulation.  No numbness or weakness of lower extremities.  No urinary or bowel incontinence.     Physical Exam   Triage Vital Signs: ED Triage Vitals  Encounter Vitals Group     BP 08/24/23 0329 (!) 131/90     Systolic BP Percentile --      Diastolic BP Percentile --      Pulse Rate 08/24/23 0329 98     Resp 08/24/23 0329 18     Temp 08/24/23 0329 98.3 F (36.8 C)     Temp Source 08/24/23 0329 Oral     SpO2 08/24/23 0329 99 %     Weight 08/24/23 0317 156 lb (70.8 kg)     Height 08/24/23 0317 5' 5 (1.651 m)     Head Circumference --      Peak Flow --      Pain Score 08/24/23 0317 8     Pain Loc --      Pain Education --      Exclude from Growth Chart --     Most recent vital signs: Vitals:   08/24/23 0329  BP: (!) 131/90  Pulse: 98  Resp: 18  Temp: 98.3 F (36.8 C)  SpO2: 99%    Physical Exam Constitutional:      General: She is in acute distress.     Appearance: She is well-developed.  HENT:     Head: Atraumatic.     Right Ear: External ear normal.     Left Ear: External ear normal.  Eyes:     Extraocular Movements: Extraocular movements intact.     Conjunctiva/sclera: Conjunctivae normal.     Pupils: Pupils are equal, round, and reactive to light.  Neck:     Comments: Mild left sided  paraspinal tenderness to palpation Cardiovascular:     Rate and Rhythm: Normal rate and regular rhythm.  Pulmonary:     Effort: No respiratory distress.  Abdominal:     General: There is no distension.  Musculoskeletal:        General: Tenderness present. Normal range of motion.     Cervical back: Normal range of motion. No tenderness.     Comments: Mild midline thoracic and lumbar tenderness to palpation.  Left shoulder tenderness but full active and passive range of motion.  Mild tenderness palpation to the left ankle and left hip.  Skin:    General: Skin is warm.  Neurological:     General: No focal deficit present.     Mental Status: She is alert. Mental status is at baseline.      IMPRESSION / MDM / ASSESSMENT AND PLAN / ED COURSE  I reviewed the triage vital signs and the nursing notes.  Differential diagnosis including fracture, musculoskeletal  strain, dislocation.  Based on Nexus criteria do not feel that CT scan of the cervical spine or CT scan of the head is necessary at this time   RADIOLOGY I independently reviewed imaging, my interpretation of imaging: X-rays with no acute fracture.  X-ray is read as no acute fracture   Labs (all labs ordered are listed, but only abnormal results are displayed) Labs interpreted as -    Labs Reviewed - No data to display     Given Percocet for pain control.  Continued to have ongoing severe pain.  Given IM Dilaudid .  Patient with no acute fracture or dislocation.  Most likely with musculoskeletal strain.  No signs of cauda equina or epidural compression syndrome and do not feel that the patient needs an emergent MRI of her back.  Patient has pain medication at home.  Given Flexeril .  Discussed Lidoderm  patches and discussed follow-up with primary care provider.  PROCEDURES:  Critical Care performed: No  Procedures  Patient's presentation is most consistent with acute presentation with potential threat to life or bodily  function.   MEDICATIONS ORDERED IN ED: Medications  HYDROmorphone  (DILAUDID ) injection 1 mg (has no administration in time range)  oxyCODONE -acetaminophen  (PERCOCET/ROXICET) 5-325 MG per tablet 2 tablet (2 tablets Oral Given 08/24/23 0553)    FINAL CLINICAL IMPRESSION(S) / ED DIAGNOSES   Final diagnoses:  Fall, initial encounter  Acute midline back pain, unspecified back location     Rx / DC Orders   ED Discharge Orders          Ordered    cyclobenzaprine  (FLEXERIL ) 5 MG tablet  3 times daily PRN        08/24/23 0548             Note:  This document was prepared using Dragon voice recognition software and may include unintentional dictation errors.   Suzanne Kirsch, MD 08/24/23 [phone]

## 2023-08-24 NOTE — ED Triage Notes (Signed)
 Patient ambulatory to triage with steady gait, without difficulty or distress noted; pt reports that she slipped on ice and fell last night; denies hitting head; c/o pain to "entire back"; st hx back surgery in October

## 2023-09-06 ENCOUNTER — Telehealth: Payer: Self-pay | Admitting: Oncology

## 2023-09-06 NOTE — Telephone Encounter (Signed)
Pt missed appt in July. Please r/s lab prior to MD/ venofer OR lab/MD, then we will schedule venofer after labs come back.

## 2023-09-06 NOTE — Telephone Encounter (Signed)
Patient left voicemail to ask for labs and iron infusion- please call [phone]

## 2023-09-20 ENCOUNTER — Other Ambulatory Visit: Payer: Self-pay | Admitting: *Deleted

## 2023-09-20 DIAGNOSIS — D509 Iron deficiency anemia, unspecified: Secondary | ICD-10-CM

## 2023-09-20 DIAGNOSIS — E538 Deficiency of other specified B group vitamins: Secondary | ICD-10-CM

## 2023-09-21 ENCOUNTER — Inpatient Hospital Stay: Payer: 59 | Attending: Oncology

## 2023-10-01 ENCOUNTER — Inpatient Hospital Stay: Payer: 59 | Admitting: Oncology

## 2023-10-01 ENCOUNTER — Inpatient Hospital Stay: Payer: 59

## 2023-10-01 ENCOUNTER — Encounter: Payer: Self-pay | Admitting: Oncology

## 2023-11-23 ENCOUNTER — Other Ambulatory Visit: Payer: Self-pay

## 2023-11-23 ENCOUNTER — Emergency Department

## 2023-11-23 ENCOUNTER — Emergency Department
Admission: EM | Admit: 2023-11-23 | Discharge: 2023-11-23 | Disposition: A | Discharge status: Home / Self Care | Attending: Emergency Medicine | Admitting: Emergency Medicine

## 2023-11-23 DIAGNOSIS — R519 Headache, unspecified: Secondary | ICD-10-CM | POA: Insufficient documentation

## 2023-11-23 DIAGNOSIS — W0110XA Fall on same level from slipping, tripping and stumbling with subsequent striking against unspecified object, initial encounter: Secondary | ICD-10-CM | POA: Diagnosis not present

## 2023-11-23 DIAGNOSIS — M545 Low back pain, unspecified: Secondary | ICD-10-CM | POA: Insufficient documentation

## 2023-11-23 DIAGNOSIS — M542 Cervicalgia: Secondary | ICD-10-CM | POA: Insufficient documentation

## 2023-11-23 DIAGNOSIS — R200 Anesthesia of skin: Secondary | ICD-10-CM | POA: Diagnosis not present

## 2023-11-23 DIAGNOSIS — W19XXXA Unspecified fall, initial encounter: Secondary | ICD-10-CM

## 2023-11-23 MED ORDER — PREDNISONE 10 MG (21) PO TBPK: ORAL_TABLET | ORAL | 0 refills | Status: DC

## 2023-11-23 MED ORDER — FENTANYL CITRATE PF 50 MCG/ML IJ SOSY
50.0000 ug | PREFILLED_SYRINGE | Freq: Once | INTRAMUSCULAR | Status: AC
  Administered 2023-11-23: 50 ug via INTRAMUSCULAR
  Filled 2023-11-23: qty 1

## 2023-11-23 MED ORDER — ACETAMINOPHEN 325 MG PO TABS
650.0000 mg | ORAL_TABLET | Freq: Once | ORAL | Status: AC
Start: 2023-11-23 — End: 2023-11-23
  Administered 2023-11-23: 650 mg via ORAL
  Filled 2023-11-23: qty 2

## 2023-11-23 MED ORDER — ONDANSETRON 4 MG PO TBDP
4.0000 mg | ORAL_TABLET | Freq: Once | ORAL | Status: AC
  Administered 2023-11-23: 4 mg via ORAL
  Filled 2023-11-23: qty 1

## 2023-11-23 MED ORDER — PREDNISONE 20 MG PO TABS
60.0000 mg | ORAL_TABLET | Freq: Once | ORAL | Status: AC
  Administered 2023-11-23: 60 mg via ORAL
  Filled 2023-11-23: qty 3

## 2023-11-23 MED ORDER — DIPHENHYDRAMINE HCL 25 MG PO CAPS
25.0000 mg | ORAL_CAPSULE | Freq: Once | ORAL | Status: AC
  Administered 2023-11-23: 25 mg via ORAL
  Filled 2023-11-23: qty 1

## 2023-11-23 MED ORDER — METHOCARBAMOL 500 MG PO TABS
500.0000 mg | ORAL_TABLET | Freq: Once | ORAL | Status: AC
  Administered 2023-11-23: 500 mg via ORAL
  Filled 2023-11-23: qty 1

## 2023-11-23 NOTE — ED Notes (Signed)
 Written and verbal discharge instructions reviewed with pt, understanding verbalized, denied questions. Discharged from unit to lobby via wheelchair in good condition to await for her friend to arrive to pick her up.

## 2023-11-23 NOTE — ED Triage Notes (Signed)
 Pt in ambulatory with head/neck/low back and L leg pain after trip fall an hour ago. Pt states she was at home and her dog hit the back of her leg, causing her to fall backwards striking hardwood floor. Pt denies thinners or LOC. Hx of low back surgery in October, reports bilateral hand numbness and +midline neck pain (collar applied in triage).

## 2023-11-23 NOTE — ED Notes (Signed)
 Pt placed in C-collar

## 2023-11-23 NOTE — Discharge Instructions (Addendum)
 Please take steroids as directed.  Tylenol 650 mg 4 times daily.  Please monitor glucose closely as it can elevate with steroids.  Follow-up closely with spinal doctor.  Return here for new or worse symptoms.

## 2023-11-23 NOTE — ED Provider Notes (Signed)
 [place] [facility] DEPARTMENT AT [facility] Provider Note   CSN: [id number] Arrival date & time: 11/23/23  1526     History  Chief Complaint  Patient presents with   Fall   Head Injury    Marissa [name] is a 48 y.o. female.  Patient with a history of lumbar fusion and low back pain with radicular symptoms here for fall.  Earlier today her 120 pound dog hit her leg causing her to fall backwards hitting head.  Now has head neck and low back pain with worsening radicular numbness.  Denies any bowel or bladder dysfunction.  Does have nausea severe headache but denies any loss of consciousness blood thinner use or prior head bleed.  No chest or abdominal pain no shortness of breath.   Fall Associated symptoms include headaches. Pertinent negatives include no chest pain and no abdominal pain.  Head Injury Associated symptoms: headache, neck pain and numbness        Home Medications Prior to Admission medications   Medication Sig Start Date End Date Taking? Authorizing Provider  predniSONE (STERAPRED UNI-PAK 21 TAB) 10 MG (21) TBPK tablet Take as directed on pgk 11/23/23  Yes Celita Aron, PA-C  acetaminophen (TYLENOL) 500 MG tablet Take 1,000 mg by mouth 2 (two) times daily as needed for moderate pain or headache.    [provider]  BIOTIN PO Take 1 tablet by mouth daily.    [provider]  cyclobenzaprine (FLEXERIL) 5 MG tablet Take 1 tablet (5 mg total) by mouth 3 (three) times daily as needed for muscle spasms. 08/24/23   Viviano Ground, MD  estradiol (ESTRACE) 0.1 MG/GM vaginal cream Place 1 Applicatorful vaginally at bedtime.    [provider]  gabapentin (NEURONTIN) 600 MG tablet Take 600 mg by mouth See admin instructions. Take 600 mg in the morning, 600 mg in the evening, and 1200 mg at bedtime    [provider]  insulin aspart (NOVOLOG) 100 UNIT/ML injection Inject 2-5 Units into the skin 3 (three) times daily before meals.     [provider]  NARCAN 4 MG/0.1ML LIQD nasal spray kit Place 1 spray into the nose once. If no response after 2 minutes repeat in opposite nostril. 01/15/18   [provider]  nitroGLYCERIN (NITROSTAT) 0.4 MG SL tablet Place 0.4 mg under the tongue every 5 (five) minutes as needed for chest pain.    [provider]  nortriptyline (PAMELOR) 10 MG capsule Take 20 mg by mouth at bedtime.    [provider]  ondansetron (ZOFRAN-ODT) 4 MG disintegrating tablet Take 1 tablet (4 mg total) by mouth every 8 (eight) hours as needed for nausea or vomiting. 04/17/23   Vaillancourt, Samantha, PA-C  oxycodone (ROXICODONE) 30 MG immediate release tablet Take 0.5 tablets (15 mg total) by mouth every 3 (three) hours as needed for pain. 05/22/23   Meyran, Kenard Paul, NP  OZEMPIC, 2 MG/DOSE, 8 MG/3ML SOPN Inject 2 mg into the skin every Wednesday. 03/23/23   [provider]  rosuvastatin (CRESTOR) 20 MG tablet Take 20 mg by mouth daily. 03/01/23 02/29/24  [provider]  tiZANidine (ZANAFLEX) 4 MG tablet Take 1 tablet (4 mg total) by mouth 3 (three) times daily as needed for muscle spasms. 05/22/23   Meyran, Kenard Paul, NP  UBRELVY 100 MG TABS Take 100 mg by mouth daily as needed (migraines). 08/05/21   [provider]      Allergies    Bactrim [sulfamethoxazole-trimethoprim],  Ciprofloxacin, Vancomycin, Doxycycline, Erythromycin, Nsaids, Amoxicillin, Azithromycin, Cefotaxime, Ceftriaxone, and Food    Review of Systems   Review of Systems  Constitutional:  Negative for chills and fever.  HENT:  Negative for congestion.   Cardiovascular:  Negative for chest pain.  Gastrointestinal:  Negative for abdominal pain.  Genitourinary:  Negative for difficulty urinating.  Musculoskeletal:  Positive for arthralgias and neck pain.  Skin:  Negative for wound.  Neurological:  Positive for numbness and headaches. Negative for dizziness and weakness.     Physical Exam Updated Vital Signs BP (!) 189/109   Pulse (!) 109   Temp 98.8 F (37.1 C) (Oral)   Resp 20   Wt 67.6 kg   LMP 12/21/2014 (Approximate)   SpO2 100%   BMI 24.79 kg/m  Physical Exam Vitals reviewed.  Constitutional:      Appearance: Normal appearance.  HENT:     Head: Normocephalic and atraumatic.     Nose: Nose normal.  Cardiovascular:     Pulses: Normal pulses.  Pulmonary:     Effort: Pulmonary effort is normal.  Chest:     Chest wall: No tenderness.  Abdominal:     Tenderness: There is no abdominal tenderness.  Musculoskeletal:        General: No swelling or tenderness.     Cervical back: Normal range of motion.     Comments: No upper lower extremity bony tenderness.  Distal radial and dorsal pedal pulses are intact.  Equal bilateral dorsal and plantarflexion.  Mild decrease sensation of left lower leg although it is intact.  Mild midline cervical and lumbar tenderness.  C-collar in place.  Neurological:     Mental Status: She is alert and oriented to person, place, and time. Mental status is at baseline.  Psychiatric:        Mood and Affect: Mood normal.        Behavior: Behavior normal.     ED Results / Procedures / Treatments   Labs (all labs ordered are listed, but only abnormal results are displayed) Labs Reviewed - No data to display  EKG None  Radiology CT Head Wo Contrast Result Date: 11/23/2023 CLINICAL DATA:  Provided history: Head trauma, moderate/severe. Neck trauma, midline tenderness. Additional history provided: Fall. Head and neck pain. EXAM: CT HEAD WITHOUT CONTRAST CT CERVICAL SPINE WITHOUT CONTRAST TECHNIQUE: Multidetector CT imaging of the head and cervical spine was performed following the standard protocol without intravenous contrast. Multiplanar CT image reconstructions of the cervical spine were also generated. RADIATION DOSE REDUCTION: This exam was performed according to the departmental dose-optimization program which  includes automated exposure control, adjustment of the mA and/or kV according to patient size and/or use of iterative reconstruction technique. COMPARISON:  Head CT 01/18/2022. Brain MRI 01/18/2022. Cervical spine CT 07/05/2022. FINDINGS: CT HEAD FINDINGS Streak/beam hardening artifact arising from the patient's earrings partially obscures the posterior fossa. Within this limitation, findings are as follows. Brain: Cerebral volume is normal. The small nonspecific chronic white matter insult demonstrated within the genu of left internal capsule on the prior brain MRI of 01/18/2022 is occult by CT. Partially empty sella turcica. There is no acute intracranial hemorrhage. No demarcated cortical infarct. No extra-axial fluid collection. No evidence of an intracranial mass. No midline shift. Vascular: No hyperdense vessel.  Atherosclerotic calcifications. Skull: No calvarial fracture or aggressive osseous lesion. Sinuses/Orbits: No mass or acute finding within the imaged orbits. No significant paranasal sinus disease at the imaged levels. CT CERVICAL SPINE FINDINGS Alignment: No  significant spondylolisthesis. Mild levocurvature of the cervical spine. Skull base and vertebrae: The basion-dental and atlanto-dental intervals are maintained.No evidence of an acute cervical spine fracture. Prior C3-C6 ACDF. No evidence of hardware fracture solid arthrodesis at C3-C4, C4-C5 and C5-C6. Soft tissues and spinal canal: No prevertebral fluid or swelling. No visible canal hematoma. Disc levels: Prior C3-C6 ACDF. Cervical spondylosis. C2-C3 posterior disc osteophyte complex. Multilevel endplate osteophytosis and ossification of posterior longitudinal ligament, as well as uncovertebral hypertrophy. Disc bulge/central disc protrusion at C6-C7. Multilevel spinal canal narrowing. Most notably, the C2-C3 posterior disc osteophyte complex results in at least moderate spinal canal stenosis. Multilevel bony neural foraminal narrowing. Bulky  C6-C7 ventral osteophyte. Upper chest: No consolidation within the imaged lung apices. No visible pneumothorax. IMPRESSION: CT head: 1. Streak/beam hardening artifact arising from the patient's earrings partially obscures the posterior fossa. Within this limitation, no evidence of an acute intracranial abnormality. 2. A known small nonspecific chronic white matter insult within the genu of left internal capsule is occult by CT. 3. Partially empty sella turcica. This finding can reflect incidental anatomic variation, or alternatively, it can be associated with chronic idiopathic intracranial hypertension (pseudotumor cerebri). CT cervical spine: 1. No evidence of an acute cervical spine fracture. 2. Prior C3-C6 ACDF. 3. Cervical spondylosis and ossification of the posterior longitudinal ligament as described within the body of the report. Multilevel spinal canal stenosis. Most notably at C2-C3, a posterior disc osteophyte complex results in at least moderate spinal canal stenosis. Multilevel bony neural foraminal narrowing. 4. Mild levocurvature of the cervical spine. Electronically Signed   By: Bascom Lily D.O.   On: 11/23/2023 17:09   CT Cervical Spine Wo Contrast Result Date: 11/23/2023 CLINICAL DATA:  Provided history: Head trauma, moderate/severe. Neck trauma, midline tenderness. Additional history provided: Fall. Head and neck pain. EXAM: CT HEAD WITHOUT CONTRAST CT CERVICAL SPINE WITHOUT CONTRAST TECHNIQUE: Multidetector CT imaging of the head and cervical spine was performed following the standard protocol without intravenous contrast. Multiplanar CT image reconstructions of the cervical spine were also generated. RADIATION DOSE REDUCTION: This exam was performed according to the departmental dose-optimization program which includes automated exposure control, adjustment of the mA and/or kV according to patient size and/or use of iterative reconstruction technique. COMPARISON:  Head CT 01/18/2022. Brain  MRI 01/18/2022. Cervical spine CT 07/05/2022. FINDINGS: CT HEAD FINDINGS Streak/beam hardening artifact arising from the patient's earrings partially obscures the posterior fossa. Within this limitation, findings are as follows. Brain: Cerebral volume is normal. The small nonspecific chronic white matter insult demonstrated within the genu of left internal capsule on the prior brain MRI of 01/18/2022 is occult by CT. Partially empty sella turcica. There is no acute intracranial hemorrhage. No demarcated cortical infarct. No extra-axial fluid collection. No evidence of an intracranial mass. No midline shift. Vascular: No hyperdense vessel.  Atherosclerotic calcifications. Skull: No calvarial fracture or aggressive osseous lesion. Sinuses/Orbits: No mass or acute finding within the imaged orbits. No significant paranasal sinus disease at the imaged levels. CT CERVICAL SPINE FINDINGS Alignment: No significant spondylolisthesis. Mild levocurvature of the cervical spine. Skull base and vertebrae: The basion-dental and atlanto-dental intervals are maintained.No evidence of an acute cervical spine fracture. Prior C3-C6 ACDF. No evidence of hardware fracture solid arthrodesis at C3-C4, C4-C5 and C5-C6. Soft tissues and spinal canal: No prevertebral fluid or swelling. No visible canal hematoma. Disc levels: Prior C3-C6 ACDF. Cervical spondylosis. C2-C3 posterior disc osteophyte complex. Multilevel endplate osteophytosis and ossification of posterior longitudinal ligament, as well as  uncovertebral hypertrophy. Disc bulge/central disc protrusion at C6-C7. Multilevel spinal canal narrowing. Most notably, the C2-C3 posterior disc osteophyte complex results in at least moderate spinal canal stenosis. Multilevel bony neural foraminal narrowing. Bulky C6-C7 ventral osteophyte. Upper chest: No consolidation within the imaged lung apices. No visible pneumothorax. IMPRESSION: CT head: 1. Streak/beam hardening artifact arising from the  patient's earrings partially obscures the posterior fossa. Within this limitation, no evidence of an acute intracranial abnormality. 2. A known small nonspecific chronic white matter insult within the genu of left internal capsule is occult by CT. 3. Partially empty sella turcica. This finding can reflect incidental anatomic variation, or alternatively, it can be associated with chronic idiopathic intracranial hypertension (pseudotumor cerebri). CT cervical spine: 1. No evidence of an acute cervical spine fracture. 2. Prior C3-C6 ACDF. 3. Cervical spondylosis and ossification of the posterior longitudinal ligament as described within the body of the report. Multilevel spinal canal stenosis. Most notably at C2-C3, a posterior disc osteophyte complex results in at least moderate spinal canal stenosis. Multilevel bony neural foraminal narrowing. 4. Mild levocurvature of the cervical spine. Electronically Signed   By: Bascom Lily D.O.   On: 11/23/2023 17:09   CT Lumbar Spine Wo Contrast Result Date: 11/23/2023 CLINICAL DATA:  Lumbar radiculopathy, trauma. Lower back and left leg pain after fall. EXAM: CT LUMBAR SPINE WITHOUT CONTRAST TECHNIQUE: Multidetector CT imaging of the lumbar spine was performed without intravenous contrast administration. Multiplanar CT image reconstructions were also generated. RADIATION DOSE REDUCTION: This exam was performed according to the departmental dose-optimization program which includes automated exposure control, adjustment of the mA and/or kV according to patient size and/or use of iterative reconstruction technique. COMPARISON:  Lumbar spine CT 12/04/2022. FINDINGS: Segmentation: Conventional numbering is assumed with 5 non-rib-bearing, lumbar type vertebral bodies. Alignment: Normal. Vertebrae: Normal vertebral body heights. No acute fracture. Postoperative changes from interval extension of fusion, now including L3-S1 interbody and posterior spinal fusion with L3-S1  laminectomy. Hardware is intact. No associated lucency. Paraspinal and other soft tissues: Atherosclerotic calcifications of the abdominal aorta and its branches. Disc levels: T12-L1:  Fused right-sided facet joint. L1-L2: Calcified right subarticular/foraminal disc protrusion and right-sided facet arthropathy contribute to severe right neural foraminal narrowing. L2-L3: Adjacent segment. Partially calcified disc bulge and facet arthropathy results in mild spinal canal stenosis. L3-L4: Prior laminectomy, interbody and posterior spinal fusion. No spinal canal stenosis or neural foraminal narrowing. L4-L5: Prior laminectomy, interbody and posterior spinal fusion. No spinal canal stenosis or neural foraminal narrowing. L5-S1: Prior laminectomy, interbody and posterior spinal fusion. No spinal canal stenosis or neural foraminal narrowing. IMPRESSION: 1. No acute fracture or traumatic listhesis of the lumbar spine. 2. Postoperative changes from interval extension of fusion, now including L3-S1 interbody and posterior spinal fusion with L3-S1 laminectomy. No evidence of hardware complication. 3. Calcified right subarticular/foraminal disc protrusion and right-sided facet arthropathy contributing to severe right neural foraminal narrowing at L1-2. Electronically Signed   By: Audra Blend M.D.   On: 11/23/2023 16:57    Procedures Procedures    Medications Ordered in ED Medications  predniSONE (DELTASONE) tablet 60 mg (has no administration in time range)  diphenhydrAMINE (BENADRYL) capsule 25 mg (has no administration in time range)  acetaminophen (TYLENOL) tablet 650 mg (has no administration in time range)  methocarbamol (ROBAXIN) tablet 500 mg (has no administration in time range)  fentaNYL (SUBLIMAZE) injection 50 mcg (50 mcg Intramuscular Given 11/23/23 1652)  ondansetron (ZOFRAN-ODT) disintegrating tablet 4 mg (4 mg Oral Given 11/23/23 1713)  ED Course/ Medical Decision Making/ A&P                                  Medical Decision Making Patient here for fall from standing with head injury no LOC blood thinner use but she does have severe headache and nausea as well as midline cervical and lumbar tenderness.  History of lumbar fusion 6 months ago with radicular symptoms having continued/worsening subjective left leg numbness without any weakness on exam.  No bowel or bladder dysfunction.  Will check head neck and lumbar CT I have low suspicion of cauda equina syndrome.  CTs are reassuring.  Head CT shows no acute abnormality does show streak artifact however no evidence of ICH is identified.  Have low suspicion of ICH regardless.  Did show empty sella turcica likely anatomical as she has no history of headaches and this was for a trauma.  No acute findings of cervical or lumbar although she does have significant degenerative disease.  She does have a follow-up appointment planned in 4 days with her spinal surgeon.  I will recommend steroids monitor her glucose closely.  She reports a recent hemoglobin A1c of 7.2.  Also Tylenol for pain.  Given return precautions.  Amount and/or Complexity of Data Reviewed Radiology: ordered.  Risk OTC drugs. Prescription drug management.           Final Clinical Impression(s) / ED Diagnoses Final diagnoses:  Fall, initial encounter  Cervicalgia  Low back pain, unspecified back pain laterality, unspecified chronicity, unspecified whether sciatica present    Rx / DC Orders ED Discharge Orders          Ordered    predniSONE (STERAPRED UNI-PAK 21 TAB) 10 MG (21) TBPK tablet        11/23/23 1730              Hollie Luria, PA-C 11/23/23 1733    Jacquie Maudlin, MD 11/24/23 0020

## 2023-12-15 ENCOUNTER — Emergency Department
Admission: EM | Admit: 2023-12-15 | Discharge: 2023-12-15 | Disposition: A | Discharge status: Home / Self Care | Attending: Emergency Medicine | Admitting: Emergency Medicine

## 2023-12-15 ENCOUNTER — Emergency Department

## 2023-12-15 ENCOUNTER — Other Ambulatory Visit: Payer: Self-pay

## 2023-12-15 DIAGNOSIS — R519 Headache, unspecified: Secondary | ICD-10-CM | POA: Diagnosis present

## 2023-12-15 DIAGNOSIS — R1030 Lower abdominal pain, unspecified: Secondary | ICD-10-CM

## 2023-12-15 DIAGNOSIS — R1031 Right lower quadrant pain: Secondary | ICD-10-CM | POA: Insufficient documentation

## 2023-12-15 DIAGNOSIS — E119 Type 2 diabetes mellitus without complications: Secondary | ICD-10-CM | POA: Insufficient documentation

## 2023-12-15 DIAGNOSIS — I10 Essential (primary) hypertension: Secondary | ICD-10-CM | POA: Insufficient documentation

## 2023-12-15 DIAGNOSIS — H538 Other visual disturbances: Secondary | ICD-10-CM | POA: Diagnosis not present

## 2023-12-15 LAB — URINALYSIS, ROUTINE W REFLEX MICROSCOPIC
Bilirubin Urine: NEGATIVE
Glucose, UA: 150 mg/dL — AB
Hgb urine dipstick: NEGATIVE
Ketones, ur: NEGATIVE mg/dL
Leukocytes,Ua: NEGATIVE
Nitrite: NEGATIVE
Protein, ur: 30 mg/dL — AB
Specific Gravity, Urine: 1.029 (ref 1.005–1.030)
pH: 6 (ref 5.0–8.0)

## 2023-12-15 LAB — CBC
HCT: 37.7 % (ref 36.0–46.0)
Hemoglobin: 11.6 g/dL — ABNORMAL LOW (ref 12.0–15.0)
MCH: 29 pg (ref 26.0–34.0)
MCHC: 30.8 g/dL (ref 30.0–36.0)
MCV: 94.3 fL (ref 80.0–100.0)
Platelets: 170 10*3/uL (ref 150–400)
RBC: 4 MIL/uL (ref 3.87–5.11)
RDW: 14.8 % (ref 11.5–15.5)
WBC: 3.7 10*3/uL — ABNORMAL LOW (ref 4.0–10.5)
nRBC: 0 % (ref 0.0–0.2)

## 2023-12-15 LAB — COMPREHENSIVE METABOLIC PANEL WITH GFR
ALT: 22 U/L (ref 0–44)
AST: 23 U/L (ref 15–41)
Albumin: 3.7 g/dL (ref 3.5–5.0)
Alkaline Phosphatase: 94 U/L (ref 38–126)
Anion gap: 8 (ref 5–15)
BUN: 19 mg/dL (ref 6–20)
CO2: 27 mmol/L (ref 22–32)
Calcium: 9.2 mg/dL (ref 8.9–10.3)
Chloride: 107 mmol/L (ref 98–111)
Creatinine, Ser: 0.85 mg/dL (ref 0.44–1.00)
GFR, Estimated: >60 mL/min (ref >60)
Glucose, Bld: 134 mg/dL — ABNORMAL HIGH (ref 70–99)
Potassium: 3.5 mmol/L (ref 3.5–5.1)
Sodium: 142 mmol/L (ref 135–145)
Total Bilirubin: 0.5 mg/dL (ref 0.0–1.2)
Total Protein: 6.8 g/dL (ref 6.5–8.1)

## 2023-12-15 LAB — LIPASE, BLOOD: Lipase: 44 U/L (ref 11–51)

## 2023-12-15 MED ORDER — METOCLOPRAMIDE HCL 5 MG/ML IJ SOLN
10.0000 mg | Freq: Once | INTRAMUSCULAR | Status: AC
  Administered 2023-12-15: 10 mg via INTRAVENOUS
  Filled 2023-12-15: qty 2

## 2023-12-15 MED ORDER — NITROFURANTOIN MONOHYD MACRO 100 MG PO CAPS: 100.0000 mg | ORAL_CAPSULE | Freq: Two times a day (BID) | ORAL | 0 refills | Status: AC

## 2023-12-15 MED ORDER — IOHEXOL 300 MG/ML  SOLN
100.0000 mL | Freq: Once | INTRAMUSCULAR | Status: AC | PRN
  Administered 2023-12-15: 100 mL via INTRAVENOUS

## 2023-12-15 MED ORDER — PROCHLORPERAZINE EDISYLATE 10 MG/2ML IJ SOLN
10.0000 mg | Freq: Once | INTRAMUSCULAR | Status: AC
  Administered 2023-12-15: 10 mg via INTRAVENOUS
  Filled 2023-12-15: qty 2

## 2023-12-15 MED ORDER — ONDANSETRON HCL 4 MG/2ML IJ SOLN
4.0000 mg | Freq: Once | INTRAMUSCULAR | Status: AC
  Administered 2023-12-15: 4 mg via INTRAVENOUS
  Filled 2023-12-15: qty 2

## 2023-12-15 MED ORDER — ACETAMINOPHEN 325 MG PO TABS
650.0000 mg | ORAL_TABLET | Freq: Once | ORAL | Status: AC
  Administered 2023-12-15: 650 mg via ORAL
  Filled 2023-12-15: qty 2

## 2023-12-15 MED ORDER — IOHEXOL 300 MG/ML  SOLN
60.0000 mL | Freq: Once | INTRAMUSCULAR | Status: AC | PRN
  Administered 2023-12-15: 60 mL via INTRAVENOUS

## 2023-12-15 MED ORDER — SODIUM CHLORIDE 0.9 % IV BOLUS
1000.0000 mL | Freq: Once | INTRAVENOUS | Status: AC
  Administered 2023-12-15: 1000 mL via INTRAVENOUS

## 2023-12-15 MED ORDER — DEXAMETHASONE SODIUM PHOSPHATE 10 MG/ML IJ SOLN
4.0000 mg | Freq: Once | INTRAMUSCULAR | Status: AC
  Administered 2023-12-15: 4 mg via INTRAVENOUS
  Filled 2023-12-15: qty 1

## 2023-12-15 MED ORDER — DIPHENHYDRAMINE HCL 50 MG/ML IJ SOLN
25.0000 mg | Freq: Once | INTRAMUSCULAR | Status: AC
  Administered 2023-12-15: 25 mg via INTRAVENOUS
  Filled 2023-12-15: qty 1

## 2023-12-15 NOTE — ED Provider Notes (Signed)
-----------------------------------------   5:14 PM on 12/15/2023 -----------------------------------------  I took over care of this patient from [facility].  The patient reports persistent headaches since a concussion on 4/11 and had a negative CT at that time.  She also has right sided abdominal pain.  CT abdomen/pelvis was pending at the time of signout and is negative.  IMPRESSION:  1. No acute findings.  2.  Aortic Atherosclerosis (ICD10-I70.0).   On reassessment, the patient reported persistent headache.  She had seen Dr. Walden Guise from neurology on 5/1 for this and was planned for an outpatient CT of the head as well as an outpatient lumbar puncture.  At this time, there is no indication for an emergent lumbar puncture in the ED as there is no concern for infectious etiology.  However we did obtain the CTA and it is negative.  IMPRESSION:  1. Normal noncontrast CT of the head.  2. Normal CTA of the head.   The patient had only gotten Tylenol  for the headache prior to signout.  I gave a dose of Reglan  and Benadryl .  The patient did not have significant relief with this.  She is allergic to NSAIDs.  I then gave a dose of dexamethasone  and Compazine .  She now reports that there is significant improvement in the headache.  She is stable for discharge home at this time.  I counseled her on the results of the workup.  Patient reports malodorous urine and the UA shows bacteria and WBCs.  I will start her on Keflex  for UTI.  The patient strict return precautions and she expressed understanding.       Lind Repine, MD 12/15/23 1718

## 2023-12-15 NOTE — ED Triage Notes (Signed)
 Pt to ED for HA (whole head, shooting pains) and blurry vision to both eyes that are ongoing since she had a concussion on 11/23/23. Pt was prescribed Fioricet  and has been taking this daily and was advised by MD to cut back. Pt endorses photosensitivity.  Pt also complains of R  abdominal pain to mid R (not upper or lower) and is pending ultrasound. Has appendix, no GB. Endorses nausea.  BP slightly low, took twice (MAP 72).

## 2023-12-15 NOTE — ED Notes (Signed)
 Room darkened for patient's comfort. Patient using blanket to cover eyes when lights are on.

## 2023-12-15 NOTE — ED Provider Notes (Signed)
 [facility] Provider Note    Event Date/Time   First MD Initiated Contact with Patient 12/15/23 [phone]     (approximate)   History   Headache, Blurred Vision, and Abdominal Pain   HPI  Marissa [name] is a 48 y.o. female   presents to the ED with complaint of generalized headache and blurred vision to both eyes that has been going on since she had a concussion on 11/23/2023.  At that time she was seen in the emergency department following a fall in which she fell backwards striking her head.  CT head, cervical spine and lumbar spine were all reassuring and patient was discharged on Fioricet  which she has been taking daily.  She reports that she has been taking this but continues to have photosensitivity.  Nausea present.  Patient also complains of right abdominal pain and has been seen by her PCP for this who suggested that she have an ultrasound which is still pending.  Patient endorses nausea but no vomiting.  History of hypertension, diabetes, lumbar HNP, hypertension, history UTIs, neuropathy secondary to diabetes, migraines, polyarthralgia, RLS, chronic tension type headaches, status post abdominal wall abscess, history of old MI without definite time.Aaron Aas      Physical Exam   Triage Vital Signs: ED Triage Vitals  Encounter Vitals Group     BP 12/15/23 0850 (!) 87/64     Systolic BP Percentile --      Diastolic BP Percentile --      Pulse Rate 12/15/23 0850 92     Resp 12/15/23 0850 20     Temp 12/15/23 0850 97.8 F (36.6 C)     Temp Source 12/15/23 0850 Oral     SpO2 12/15/23 0850 98 %     Weight 12/15/23 0854 152 lb (68.9 kg)     Height 12/15/23 0854 5\' 4"  (1.626 m)     Head Circumference --      Peak Flow --      Pain Score 12/15/23 0851 7     Pain Loc --      Pain Education --      Exclude from Growth Chart --     Most recent vital signs: Vitals:   12/15/23 0945 12/15/23 1134  BP: 90/64 (!) 138/95  Pulse: 79 86  Resp:  16  Temp:     SpO2: 97% 98%     General: Awake, no distress.  Alert, talkative, answers questions appropriately.  Photosensitivity in exam room. CV:  Good peripheral perfusion.  Heart regular rate and rhythm. Resp:  Normal effort.  Lungs clear bilaterally. Abd:  No distention.  Soft with moderate tenderness on palpation of the right mid to lower quadrant area.  Bowel sounds present x 4 quadrants.  Well-healed surgical scar at the umbilicus extending to the pubic area.  Skin without discoloration. Other:  Cranial nerves II through XII grossly intact.  Pupils are slightly pinpoint bilaterally but reactive.  Patient has colored contacts and presently.  Cranial nerves II through XII grossly intact.  Good muscle strength upper and lower extremities bilaterally.  Patient was able to stand and ambulate without any assistance.  Patient is normal.   ED Results / Procedures / Treatments   Labs (all labs ordered are listed, but only abnormal results are displayed) Labs Reviewed  COMPREHENSIVE METABOLIC PANEL WITH GFR - Abnormal; Notable for the following components:      Result Value   Glucose, Bld 134 (*)    All  other components within normal limits  CBC - Abnormal; Notable for the following components:   WBC 3.7 (*)    Hemoglobin 11.6 (*)    All other components within normal limits  URINALYSIS, ROUTINE W REFLEX MICROSCOPIC - Abnormal; Notable for the following components:   Color, Urine AMBER (*)    APPearance CLOUDY (*)    Glucose, UA 150 (*)    Protein, ur 30 (*)    Bacteria, UA MANY (*)    All other components within normal limits  LIPASE, BLOOD  POC URINE PREG, ED      RADIOLOGY Pending    PROCEDURES:  Critical Care performed:   Procedures   MEDICATIONS ORDERED IN ED: Medications  ondansetron  (ZOFRAN ) injection 4 mg (4 mg Intravenous Given 12/15/23 1132)  acetaminophen  (TYLENOL ) tablet 650 mg (650 mg Oral Given 12/15/23 1135)  sodium chloride  0.9 % bolus 1,000 mL (1,000 mLs  Intravenous New Bag/Given 12/15/23 1129)     IMPRESSION / MDM / ASSESSMENT AND PLAN / ED COURSE  I reviewed the triage vital signs and the nursing notes.   Differential diagnosis includes, but is not limited to, postconcussive headache, migraine, muscle contraction/tension headache, abdominal pain, urinary tract infection, appendicitis, urolithiasis, gastroenteritis.  ----------------------------------------- 11:37 AM on 12/15/2023 ----------------------------------------- At this time Dr. Demetrios Finders is taking over the care of this patient at this time and patient is pending IV fluids and CT ABD/pelvis.        Patient's presentation is most consistent with acute presentation with potential threat to life or bodily function.  FINAL CLINICAL IMPRESSION(S) / ED DIAGNOSES   Final diagnoses:  Lower abdominal pain  Generalized headache     Rx / DC Orders   ED Discharge Orders     None        Note:  This document was prepared using Dragon voice recognition software and may include unintentional dictation errors.   Stafford Eagles, PA-C 12/15/23 1450    Lind Repine, MD 12/15/23 1610    Lind Repine, MD 12/15/23 [phone]

## 2023-12-15 NOTE — ED Notes (Signed)
 Patient states she took oxycodone  and Gabapentin  at 0100 last night. Patient asked for Tylenol  and nausea med. Patient is drinking her bottled water  at this time.

## 2023-12-15 NOTE — ED Notes (Signed)
 Patient was able to tolerate the light shining into the pupils well, kept eyes open. Patient has in her contacts, earbud. Patient was given a towel to protect against the light during IV insertion.

## 2023-12-15 NOTE — ED Notes (Signed)
Patient ambulated to and from hallway bathroom with a slow, steady gait. 

## 2023-12-15 NOTE — ED Notes (Signed)
 Patient taken to CT scan.

## 2023-12-15 NOTE — Discharge Instructions (Signed)
 Take the antibiotic as prescribed.  Continue taking your normal headache medications.  Follow-up with your primary care provider and neurologist.  Return to the ER for new, worsening, or persistent severe headache, dizziness, vision changes, nausea or vomiting, abdominal pain, fever, or any other new or worsening symptoms that concern you.

## 2023-12-15 NOTE — ED Notes (Signed)
 Patient states nausea has improved, but still c/o pain. Dr. Demetrios Finders aware.

## 2023-12-15 NOTE — ED Notes (Signed)
 CT called and reviewed patient's history. Tech said they will be here soon.

## 2023-12-15 NOTE — ED Notes (Signed)
 Patient is calling an [employer] for a ride home. Patient will stay in the room until it arrives due to drowsiness from the medication.

## 2023-12-17 ENCOUNTER — Other Ambulatory Visit: Payer: Self-pay | Admitting: Student

## 2023-12-17 DIAGNOSIS — I671 Cerebral aneurysm, nonruptured: Secondary | ICD-10-CM

## 2023-12-18 ENCOUNTER — Encounter: Payer: Self-pay | Admitting: Student

## 2023-12-19 ENCOUNTER — Other Ambulatory Visit: Payer: Self-pay | Admitting: Internal Medicine

## 2023-12-19 DIAGNOSIS — R1033 Periumbilical pain: Secondary | ICD-10-CM

## 2023-12-21 ENCOUNTER — Other Ambulatory Visit

## 2023-12-21 ENCOUNTER — Other Ambulatory Visit: Payer: Self-pay | Admitting: Student

## 2023-12-21 ENCOUNTER — Inpatient Hospital Stay: Admission: RE | Admit: 2023-12-21 | Source: Ambulatory Visit

## 2023-12-21 DIAGNOSIS — G932 Benign intracranial hypertension: Secondary | ICD-10-CM

## 2023-12-21 DIAGNOSIS — I671 Cerebral aneurysm, nonruptured: Secondary | ICD-10-CM

## 2023-12-26 NOTE — Progress Notes (Signed)
 Patient for DG Lumbar Puncture on Thurs 12/27/23, I called and spoke with the patient on the phone and gave pre-procedure instructions. Pt was made aware to be here at 9:30a and check in at the new entrance. Pt stated understanding.  Called 12/21/23 & 12/26/23

## 2023-12-27 ENCOUNTER — Ambulatory Visit
Admission: RE | Admit: 2023-12-27 | Discharge: 2023-12-27 | Disposition: A | Discharge status: Home / Self Care | Source: Ambulatory Visit | Attending: Student | Admitting: Student

## 2023-12-27 DIAGNOSIS — G932 Benign intracranial hypertension: Secondary | ICD-10-CM | POA: Insufficient documentation

## 2023-12-27 DIAGNOSIS — I671 Cerebral aneurysm, nonruptured: Secondary | ICD-10-CM | POA: Diagnosis present

## 2023-12-27 MED ORDER — BUTALBITAL-APAP-CAFFEINE 50-325-40 MG PO TABS
1.0000 | ORAL_TABLET | Freq: Once | ORAL | Status: AC
  Administered 2023-12-27: 1 via ORAL
  Filled 2023-12-27: qty 1

## 2023-12-27 MED ORDER — LIDOCAINE HCL (PF) 1 % IJ SOLN
10.0000 mL | Freq: Once | INTRAMUSCULAR | Status: AC
Start: 2023-12-27 — End: 2023-12-27
  Administered 2023-12-27: 5 mL
  Filled 2023-12-27: qty 10

## 2023-12-27 MED ORDER — ACETAMINOPHEN-CAFFEINE 500-65 MG PO TABS
2.0000 | ORAL_TABLET | Freq: Once | ORAL | Status: AC
  Administered 2023-12-27: 2 via ORAL
  Filled 2023-12-27: qty 2

## 2023-12-27 MED ORDER — ASPIRIN-ACETAMINOPHEN-CAFFEINE 250-250-65 MG PO TABS: 2.0000 | ORAL_TABLET | Freq: Once | ORAL | Status: DC

## 2023-12-27 NOTE — Procedures (Signed)
 PROCEDURE SUMMARY:  Successful fluoroscopic guided lumbar puncture. No immediate complications.  Pt tolerated well.   EBL = none  Please see full dictation in imaging section of Epic for procedure details.

## 2023-12-27 NOTE — Progress Notes (Signed)
 Patient states some improvement with headache after fioricet  given. Taking po's without difficulty. Discharge instructions given.

## 2024-03-31 ENCOUNTER — Encounter: Payer: Self-pay | Admitting: Podiatry

## 2024-04-01 ENCOUNTER — Encounter: Payer: Self-pay | Admitting: Podiatry

## 2024-04-01 NOTE — Anesthesia Preprocedure Evaluation (Addendum)
 Anesthesia Evaluation  Patient identified by MRN, date of birth, ID band Patient awake    Reviewed: Allergy & Precautions, NPO status , Patient's Chart, lab work & pertinent test results  History of Anesthesia Complications (+) PONV and history of anesthetic complications  Airway Mallampati: III  TM Distance: >3 FB Neck ROM: full    Dental  (+) Partial Lower   Pulmonary sleep apnea    Pulmonary exam normal        Cardiovascular hypertension, + CAD, + Past MI and +CHF  Normal cardiovascular exam     Neuro/Psych  Headaches PSYCHIATRIC DISORDERS  Depression     Neuromuscular disease CVA    GI/Hepatic Neg liver ROS, PUD,GERD  ,,  Endo/Other  diabetes, Well Controlled, Type 2, Oral Hypoglycemic Agents    Renal/GU Renal disease     Musculoskeletal   Abdominal   Peds  Hematology  (+) Blood dyscrasia, anemia   Anesthesia Other Findings Past Medical History: No date: ADHD (attention deficit hyperactivity disorder)     Comment:  ADD No date: Allergy No date: Anemia No date: Arthritis No date: Back pain, chronic No date: Cardiomyopathy, dilated, nonischemic (HCC) No date: CHF (congestive heart failure) (HCC) No date: Cholelithiasis No date: Chronic kidney disease No date: Coronary artery disease No date: Crohn's disease (HCC) No date: Diabetes mellitus without complication (HCC)     Comment:  fasting cbg 50-140s No date: Dysrhythmia     Comment:  tachycardia No date: GERD (gastroesophageal reflux disease)     Comment:  otc meds No date: Headache(784.0) No date: History of bariatric surgery     Comment:  lost 200 pounds No date: Hypercholesteremia No date: Hyperlipemia No date: Hypertension 09/29/2019: IDA (iron  deficiency anemia) No date: Insulin  pump in place     Comment:  pt had insulin  pump but it is now removed (02-18-16) No date: MI, old     Comment:  2006 No date: Migraines     Comment:  once/month  maybe - can last up to two weeks 2006: Myocardial infarction [facility])     Comment:  due to medication; no evidence of ischemia or               infarction by nuclear stress test '11 No date: Neck pain, chronic No date: Neuromuscular disorder (HCC)     Comment:  neuropathy both legs No date: Neuropathy     Comment:  legs 2015: Pneumonia     Comment:  [facility] No date: PONV (postoperative nausea and vomiting) No date: Prolonged QT interval syndrome No date: PUD (peptic ulcer disease) No date: Renal insufficiency No date: Restless leg syndrome, controlled 2016: Sepsis (HCC)     Comment:  secondary to surgery No date: Sleep apnea     Comment:  does not use cpap since losing alot of weight 06/2021: Stroke (HCC) No date: Vertigo     Comment:  nop episodes 2020 No date: Vertigo No date: Vision loss     Comment:  due to diabetes No date: Wears contact lenses No date: Wears dentures     Comment:  partial lower  Past Surgical History: 04/22/2019: ACHILLES TENDON SURGERY; Right     Comment:  Procedure: [facility] PROCEDURE WITH SUTURE ANCHOR;                Surgeon: Lennie Barter, DPM;  Location: [facility] SURGERY               CN[facility];  Service: Podiatry;  Laterality: Right; 03/08/2018: APPENDECTOMY;  N/A     Comment:  Procedure: APPENDECTOMY;  Surgeon: Verdon Keen, MD;              Location: [facility] ORS;  Service: Gynecology;  Laterality:               N/A;  By Dr. Rodolph 2011: BACK SURGERY     Comment:  Lumbar  03/08/2018: BOWEL RESECTION; N/A     Comment:  Procedure: SMALL BOWEL RESECTION;  Surgeon: Verdon Keen, MD;  Location: [facility] ORS;  Service: Gynecology;                Laterality: N/A; 07/18/2004: CARDIAC CATHETERIZATION     Comment:  50-60% mid LAD, minor luminal irregularities RCA, normal              LM and CX, EF 50-55% [facility]) No date: carpel tunnel; Bilateral 2006: CERVICAL FUSION 04/08/2015: CHOLECYSTECTOMY; N/A     Comment:  Procedure:  LAPAROSCOPIC CHOLECYSTECTOMY WITH               INTRAOPERATIVE CHOLANGIOGRAM;  Surgeon: Charlie FORBES Fell,              MD;  Location: [facility] ORS;  Service: General;  Laterality:               N/A; 03/16/2018: COLON RESECTION; N/A     Comment:  Procedure: COLON RESECTION;  Surgeon: Fell Charlie FORBES,              MD;  Location: [facility] ORS;  Service: General;  Laterality:               N/A; 10/22/2019: COLONOSCOPY WITH PROPOFOL ; N/A     Comment:  Procedure: COLONOSCOPY WITH PROPOFOL ;  Surgeon: Toledo,               Ladell POUR, MD;  Location: [facility] ENDOSCOPY;  Service:               Gastroenterology;  Laterality: N/A;  [facility] DID RAPID TEST;               COPY ON CHART 10/22/2019: ESOPHAGOGASTRODUODENOSCOPY (EGD) WITH PROPOFOL ; N/A     Comment:  Procedure: ESOPHAGOGASTRODUODENOSCOPY (EGD) WITH               PROPOFOL ;  Surgeon: Toledo, Ladell POUR, MD;  Location:               [facility] ENDOSCOPY;  Service: Gastroenterology;  Laterality:               N/A; 04/22/2019: HAMMER TOE SURGERY; Right     Comment:  Procedure: HAMMER TOE CORRECTION X 4;  Surgeon: Lennie Barter, DPM;  Location: [facility] SURGERY [facility];  Service:               Podiatry;  Laterality: Right;  Diabetic - insulin  and               oral meds 05/31/2017: LAPAROSCOPIC GASTRECTOMY     Comment:  [facility], Dr. Wolm 05/07/2013: LUMBAR LAMINECTOMY/DECOMPRESSION MICRODISCECTOMY; Right     Comment:  Procedure: Right Lumbar one-two laminectomy;  Surgeon:               Arley SHAUNNA Helling, MD;  Location: [facility];  Service:  Neurosurgery;  Laterality: Right; 10/29/2013: LUMBAR LAMINECTOMY/DECOMPRESSION MICRODISCECTOMY; Left     Comment:  Procedure: Left Lumbar five-Sacral one Laminectomy;                Surgeon: Arley SHAUNNA Helling, MD;  Location: [facility];                Service: Neurosurgery;  Laterality: Left; 01/25/2014: LUMBAR WOUND DEBRIDEMENT; N/A     Comment:  Procedure: LUMBAR WOUND DEBRIDEMENT;  Surgeon: Victory DELENA Gunnels, MD;  Location: [facility];  Service:               Neurosurgery;  Laterality: N/A; 02/25/2014: LUMBAR WOUND DEBRIDEMENT; N/A     Comment:  Procedure: LUMBAR WOUND DEBRIDEMENT;  Surgeon: Arley SHAUNNA Helling, MD;  Location: [facility];  Service:               Neurosurgery;  Laterality: N/A; 03/08/2018: LYSIS OF ADHESION; N/A     Comment:  Procedure: LYSIS OF ADHESION;  Surgeon: Verdon Keen, MD;  Location: [facility] ORS;  Service: Gynecology;                Laterality: N/A; No date: SALPINGOOPHORECTOMY; Bilateral     Comment:  1 1997. 2nd 2001 03/24/2016: SHOULDER ARTHROSCOPY WITH BICEPSTENOTOMY; Left     Comment:  Procedure: shoulder arthroscopy with biceps TENOTOMY,               removal loose body, limited synovectomy;  Surgeon: Helayne Glenn, MD;  Location: [facility] ORS;  Service: Orthopedics;              Laterality: Left; No date: shoulder sugery     Comment:  7/17 rotator cuff 03/08/2018: SMALL BOWEL REPAIR; N/A     Comment:  Procedure: SMALL BOWEL REPAIR;  Surgeon: Verdon Keen, MD;  Location: [facility] ORS;  Service: Gynecology;                Laterality: N/A;     Reproductive/Obstetrics negative OB ROS                              Anesthesia Physical Anesthesia Plan  ASA: 3  Anesthesia Plan: General LMA   Post-op Pain Management: Ofirmev  IV (intra-op) and Toradol  IV (intra-op)   Induction: Intravenous  PONV Risk Score and Plan: 4 or greater and Dexamethasone , Ondansetron , Midazolam , Treatment may vary due to age or medical condition and Scopolamine  patch - Pre-op  Airway Management Planned: LMA  Additional Equipment:   Intra-op Plan:   Post-operative Plan: Extubation in OR  Informed Consent: I have reviewed the patients History and Physical, chart, labs and discussed the procedure including the risks, benefits and alternatives for the proposed anesthesia with the patient or authorized  representative who has indicated his/her understanding and acceptance.     Dental Advisory Given  Plan Discussed with: Anesthesiologist, CRNA and Surgeon  Anesthesia Plan Comments: (Patient consented for risks of anesthesia including but not limited to:  - adverse reactions to medications - damage to eyes, teeth,  lips or other oral mucosa - nerve damage due to positioning  - sore throat or hoarseness - Damage to heart, brain, nerves, lungs, other parts of body or loss of life  Patient voiced understanding and assent.)         Anesthesia Quick Evaluation

## 2024-04-02 ENCOUNTER — Other Ambulatory Visit: Payer: Self-pay | Admitting: Podiatry

## 2024-04-07 NOTE — Discharge Instructions (Signed)

## 2024-04-09 ENCOUNTER — Ambulatory Visit: Payer: Self-pay | Admitting: Anesthesiology

## 2024-04-09 ENCOUNTER — Ambulatory Visit: Payer: Self-pay

## 2024-04-09 ENCOUNTER — Other Ambulatory Visit: Payer: Self-pay

## 2024-04-09 ENCOUNTER — Ambulatory Visit
Admission: RE | Admit: 2024-04-09 | Discharge: 2024-04-09 | Disposition: A | Discharge status: Home / Self Care | Attending: Podiatry | Admitting: Podiatry

## 2024-04-09 ENCOUNTER — Encounter: Payer: Self-pay | Admitting: Podiatry

## 2024-04-09 ENCOUNTER — Encounter
Admission: RE | Disposition: A | Discharge status: Home / Self Care | Payer: Self-pay | Source: Home / Self Care | Attending: Podiatry

## 2024-04-09 ENCOUNTER — Encounter: Payer: Self-pay | Admitting: Nurse Practitioner

## 2024-04-09 DIAGNOSIS — M2042 Other hammer toe(s) (acquired), left foot: Secondary | ICD-10-CM | POA: Insufficient documentation

## 2024-04-09 DIAGNOSIS — Z794 Long term (current) use of insulin: Secondary | ICD-10-CM | POA: Insufficient documentation

## 2024-04-09 DIAGNOSIS — N289 Disorder of kidney and ureter, unspecified: Secondary | ICD-10-CM | POA: Diagnosis not present

## 2024-04-09 DIAGNOSIS — I2510 Atherosclerotic heart disease of native coronary artery without angina pectoris: Secondary | ICD-10-CM | POA: Insufficient documentation

## 2024-04-09 DIAGNOSIS — G4730 Sleep apnea, unspecified: Secondary | ICD-10-CM | POA: Diagnosis not present

## 2024-04-09 DIAGNOSIS — I252 Old myocardial infarction: Secondary | ICD-10-CM | POA: Diagnosis not present

## 2024-04-09 DIAGNOSIS — Z8249 Family history of ischemic heart disease and other diseases of the circulatory system: Secondary | ICD-10-CM | POA: Diagnosis not present

## 2024-04-09 DIAGNOSIS — M2012 Hallux valgus (acquired), left foot: Secondary | ICD-10-CM | POA: Insufficient documentation

## 2024-04-09 DIAGNOSIS — I10 Essential (primary) hypertension: Secondary | ICD-10-CM | POA: Insufficient documentation

## 2024-04-09 DIAGNOSIS — Z8711 Personal history of peptic ulcer disease: Secondary | ICD-10-CM | POA: Diagnosis not present

## 2024-04-09 DIAGNOSIS — M2041 Other hammer toe(s) (acquired), right foot: Secondary | ICD-10-CM | POA: Diagnosis present

## 2024-04-09 DIAGNOSIS — Z7985 Long-term (current) use of injectable non-insulin antidiabetic drugs: Secondary | ICD-10-CM | POA: Insufficient documentation

## 2024-04-09 DIAGNOSIS — Z8673 Personal history of transient ischemic attack (TIA), and cerebral infarction without residual deficits: Secondary | ICD-10-CM | POA: Insufficient documentation

## 2024-04-09 DIAGNOSIS — E119 Type 2 diabetes mellitus without complications: Secondary | ICD-10-CM | POA: Insufficient documentation

## 2024-04-09 DIAGNOSIS — F32A Depression, unspecified: Secondary | ICD-10-CM | POA: Diagnosis not present

## 2024-04-09 DIAGNOSIS — I509 Heart failure, unspecified: Secondary | ICD-10-CM | POA: Diagnosis not present

## 2024-04-09 DIAGNOSIS — G709 Myoneural disorder, unspecified: Secondary | ICD-10-CM | POA: Insufficient documentation

## 2024-04-09 DIAGNOSIS — Z833 Family history of diabetes mellitus: Secondary | ICD-10-CM | POA: Insufficient documentation

## 2024-04-09 DIAGNOSIS — I110 Hypertensive heart disease with heart failure: Secondary | ICD-10-CM | POA: Insufficient documentation

## 2024-04-09 HISTORY — DX: Attention-deficit hyperactivity disorder, unspecified type: F90.9

## 2024-04-09 HISTORY — PX: FLEXOR TENOTOMY: SHX6342

## 2024-04-09 HISTORY — DX: Chronic kidney disease, unspecified: N18.9

## 2024-04-09 HISTORY — PX: HAMMER TOE SURGERY: SHX385

## 2024-04-09 HISTORY — PX: ARTHRODESIS METATARSAL: SHX6565

## 2024-04-09 HISTORY — DX: Myoneural disorder, unspecified: G70.9

## 2024-04-09 HISTORY — DX: Crohn's disease, unspecified, without complications: K50.90

## 2024-04-09 HISTORY — DX: Bariatric surgery status: Z98.84

## 2024-04-09 HISTORY — DX: Peptic ulcer, site unspecified, unspecified as acute or chronic, without hemorrhage or perforation: K27.9

## 2024-04-09 LAB — GLUCOSE, CAPILLARY: Glucose-Capillary: 89 mg/dL (ref 70–99)

## 2024-04-09 SURGERY — FUSION, JOINT, INVOLVING METATARSAL BONE
Anesthesia: General | Site: Toe | Laterality: Left

## 2024-04-09 MED ORDER — SCOPOLAMINE 1 MG/3DAYS TD PT72
1.0000 | MEDICATED_PATCH | Freq: Once | TRANSDERMAL | Status: DC
  Administered 2024-04-09: 1 mg via TRANSDERMAL

## 2024-04-09 MED ORDER — OXYCODONE-ACETAMINOPHEN 5-325 MG PO TABS: 1.0000 | ORAL_TABLET | Freq: Four times a day (QID) | ORAL | 0 refills | Status: DC | PRN

## 2024-04-09 MED ORDER — MIDAZOLAM HCL 2 MG/2ML IJ SOLN
INTRAMUSCULAR | Status: AC
  Filled 2024-04-09: qty 2

## 2024-04-09 MED ORDER — FAMOTIDINE IN NACL 20-0.9 MG/50ML-% IV SOLN
INTRAVENOUS | Status: DC | PRN
Start: 2024-04-09 — End: 2024-04-09
  Administered 2024-04-09: 20 mg via INTRAVENOUS

## 2024-04-09 MED ORDER — CLINDAMYCIN PHOSPHATE 600 MG/50ML IV SOLN
600.0000 mg | INTRAVENOUS | Status: AC
  Administered 2024-04-09: 600 mg via INTRAVENOUS

## 2024-04-09 MED ORDER — LIDOCAINE HCL (PF) 2 % IJ SOLN
INTRAMUSCULAR | Status: AC
  Filled 2024-04-09: qty 5

## 2024-04-09 MED ORDER — BUPIVACAINE HCL (PF) 0.25 % IJ SOLN
INTRAMUSCULAR | Status: DC | PRN
  Administered 2024-04-09: 10 mL

## 2024-04-09 MED ORDER — LIDOCAINE HCL (CARDIAC) PF 100 MG/5ML IV SOSY
PREFILLED_SYRINGE | INTRAVENOUS | Status: DC | PRN
  Administered 2024-04-09: 40 mg via INTRATRACHEAL

## 2024-04-09 MED ORDER — PHENYLEPHRINE HCL (PRESSORS) 10 MG/ML IV SOLN
INTRAVENOUS | Status: DC | PRN
  Administered 2024-04-09 (×2): 80 ug via INTRAVENOUS
  Administered 2024-04-09 (×2): 160 ug via INTRAVENOUS
  Administered 2024-04-09 (×3): 80 ug via INTRAVENOUS
  Administered 2024-04-09: 160 ug via INTRAVENOUS
  Administered 2024-04-09: 80 ug via INTRAVENOUS
  Administered 2024-04-09: 160 ug via INTRAVENOUS
  Administered 2024-04-09: 80 ug via INTRAVENOUS

## 2024-04-09 MED ORDER — ONDANSETRON HCL 4 MG/2ML IJ SOLN
INTRAMUSCULAR | Status: DC | PRN
  Administered 2024-04-09: 4 mg via INTRAVENOUS

## 2024-04-09 MED ORDER — PROPOFOL 10 MG/ML IV BOLUS
INTRAVENOUS | Status: DC | PRN
  Administered 2024-04-09: 200 mg via INTRAVENOUS

## 2024-04-09 MED ORDER — BUPIVACAINE LIPOSOME 1.3 % IJ SUSP
INTRAMUSCULAR | Status: DC | PRN
Start: 2024-04-09 — End: 2024-04-09
  Administered 2024-04-09: 10 mL

## 2024-04-09 MED ORDER — SEVOFLURANE IN SOLN
RESPIRATORY_TRACT | Status: AC
  Filled 2024-04-09: qty 250

## 2024-04-09 MED ORDER — MIDAZOLAM HCL 5 MG/5ML IJ SOLN
INTRAMUSCULAR | Status: DC | PRN
Start: 2024-04-09 — End: 2024-04-09
  Administered 2024-04-09: 2 mg via INTRAVENOUS

## 2024-04-09 MED ORDER — OXYCODONE HCL 5 MG/5ML PO SOLN: 5.0000 mg | Freq: Once | ORAL | Status: AC | PRN

## 2024-04-09 MED ORDER — LACTATED RINGERS IV SOLN: INTRAVENOUS | Status: DC

## 2024-04-09 MED ORDER — OXYCODONE HCL 5 MG PO TABS
5.0000 mg | ORAL_TABLET | Freq: Once | ORAL | Status: AC | PRN
  Administered 2024-04-09: 5 mg via ORAL

## 2024-04-09 MED ORDER — FENTANYL CITRATE PF 50 MCG/ML IJ SOSY: 25.0000 ug | PREFILLED_SYRINGE | INTRAMUSCULAR | Status: DC | PRN

## 2024-04-09 MED ORDER — DEXAMETHASONE SODIUM PHOSPHATE 4 MG/ML IJ SOLN
INTRAMUSCULAR | Status: DC | PRN
  Administered 2024-04-09: 4 mg via INTRAVENOUS

## 2024-04-09 MED ORDER — SODIUM CHLORIDE 0.9 % IR SOLN
Status: DC | PRN
  Administered 2024-04-09: 500 mL

## 2024-04-09 MED ORDER — GLYCOPYRROLATE 0.2 MG/ML IJ SOLN
INTRAMUSCULAR | Status: DC | PRN
  Administered 2024-04-09: .2 mg via INTRAVENOUS

## 2024-04-09 MED ORDER — PROPOFOL 10 MG/ML IV BOLUS
INTRAVENOUS | Status: AC
  Filled 2024-04-09: qty 20

## 2024-04-09 MED ORDER — KETOROLAC TROMETHAMINE 30 MG/ML IJ SOLN
INTRAMUSCULAR | Status: DC | PRN
Start: 2024-04-09 — End: 2024-04-09
  Administered 2024-04-09: 30 mg via INTRAVENOUS

## 2024-04-09 MED ORDER — GENTAMICIN SULFATE 40 MG/ML IJ SOLN
INTRAMUSCULAR | Status: AC
  Filled 2024-04-09: qty 6

## 2024-04-09 MED ORDER — FENTANYL CITRATE (PF) 100 MCG/2ML IJ SOLN
INTRAMUSCULAR | Status: AC
  Filled 2024-04-09: qty 2

## 2024-04-09 MED ORDER — CLINDAMYCIN PHOSPHATE 300 MG/2ML IJ SOLN
INTRAMUSCULAR | Status: AC
  Filled 2024-04-09: qty 4

## 2024-04-09 MED ORDER — OXYCODONE HCL 5 MG PO TABS
ORAL_TABLET | ORAL | Status: AC
  Filled 2024-04-09: qty 1

## 2024-04-09 MED ORDER — GENTAMICIN SULFATE 40 MG/ML IJ SOLN: 5.0000 mg/kg | INTRAVENOUS | Status: DC

## 2024-04-09 MED ORDER — FENTANYL CITRATE (PF) 100 MCG/2ML IJ SOLN
INTRAMUSCULAR | Status: DC | PRN
  Administered 2024-04-09 (×2): 50 ug via INTRAVENOUS

## 2024-04-09 SURGICAL SUPPLY — 50 items
BENZOIN TINCTURE PRP APPL 2/3 (GAUZE/BANDAGES/DRESSINGS) ×3 IMPLANT
BIT DRILL 2 FAST STEP (BIT) ×1 IMPLANT
BIT DRILL 2 FENESTRATED (MISCELLANEOUS) ×1 IMPLANT
BLADE MINI RND TIP GREEN BEAV (BLADE) ×3 IMPLANT
BLADE OSC/SAGITTAL MD 9X18.5 (BLADE) ×2 IMPLANT
BLADE SURG 15 STRL LF DISP TIS (BLADE) ×6 IMPLANT
BNDG COHESIVE 4X5 TAN STRL LF (GAUZE/BANDAGES/DRESSINGS) ×3 IMPLANT
BNDG ELASTIC 4X5.8 VLCR NS LF (GAUZE/BANDAGES/DRESSINGS) ×3 IMPLANT
BNDG ESMARCH 4X12 STRL LF (GAUZE/BANDAGES/DRESSINGS) ×3 IMPLANT
BNDG GAUZE DERMACEA FLUFF 4 (GAUZE/BANDAGES/DRESSINGS) ×3 IMPLANT
BNDG STRETCH 4X75 STRL LF (GAUZE/BANDAGES/DRESSINGS) ×3 IMPLANT
BOOT STEPPER DURA MED (SOFTGOODS) ×1 IMPLANT
CANISTER SUCT 1200ML W/VALVE (MISCELLANEOUS) ×3 IMPLANT
COVER LIGHT HANDLE UNIVERSAL (MISCELLANEOUS) ×6 IMPLANT
DRAPE FLUOR MINI C-ARM 54X84 (DRAPES) ×3 IMPLANT
DRAPE IMP U-DRAPE 54X76 (DRAPES) ×1 IMPLANT
DURAPREP 26ML APPLICATOR (WOUND CARE) ×3 IMPLANT
ELECTRODE REM PT RTRN 9FT ADLT (ELECTROSURGICAL) ×3 IMPLANT
GAUZE SPONGE 4X4 12PLY STRL (GAUZE/BANDAGES/DRESSINGS) ×4 IMPLANT
GAUZE SPONGE NON-WVN 2X2 STRL (MISCELLANEOUS) ×2 IMPLANT
GAUZE XEROFORM 1X8 LF (GAUZE/BANDAGES/DRESSINGS) ×3 IMPLANT
GLOVE BIOGEL PI IND STRL 8 (GLOVE) ×6 IMPLANT
GLOVE SURG SS PI 7.5 STRL IVOR (GLOVE) ×6 IMPLANT
GOWN SPEC L4 XLG W/TWL (GOWN DISPOSABLE) ×3 IMPLANT
GOWN STRL REUS W/ TWL LRG LVL3 (GOWN DISPOSABLE) ×3 IMPLANT
GOWN STRL REUS W/ TWL XL LVL3 (GOWN DISPOSABLE) ×3 IMPLANT
GUIDEWIRE 1.1 DT 2-ZONE (Wire) ×2 IMPLANT
KIT TURNOVER KIT A (KITS) ×3 IMPLANT
KWIRE SMOOTH 1.6X150MM (WIRE) ×2 IMPLANT
NS IRRIG 500ML POUR BTL (IV SOLUTION) ×3 IMPLANT
PACK EXTREMITY ARMC (MISCELLANEOUS) ×3 IMPLANT
PADDING CAST BLEND 4X4 NS (MISCELLANEOUS) ×3 IMPLANT
PENCIL SMOKE EVACUATOR (MISCELLANEOUS) ×3 IMPLANT
PIN BALLS 3/8 F/.045 WIRE (MISCELLANEOUS) ×3 IMPLANT
PLATE LOCK 1ST LT MTP (Plate) ×1 IMPLANT
REAMER 17 (MISCELLANEOUS) ×1 IMPLANT
REAMER CORE MTP 17 (BIT) ×1 IMPLANT
REAMER CUP 19MM FEMALE (MISCELLANEOUS) ×1 IMPLANT
SCREW PEG 2.5X16 NONLOCK (Screw) ×1 IMPLANT
SCREW PEG LOCK 2.5X12 (Screw) ×2 IMPLANT
SCREW PEG LOCK 2.5X14 (Peg) ×1 IMPLANT
SCREW PEG LOCK 2.5X16 (Peg) ×1 IMPLANT
SCREW PEG LOCK 2.5X18 (Peg) ×2 IMPLANT
STOCKINETTE IMPERVIOUS LG (DRAPES) ×3 IMPLANT
STRIP CLOSURE SKIN 1/4X4 (GAUZE/BANDAGES/DRESSINGS) ×3 IMPLANT
SUT ETHILON 4 0 PS 2 18 (SUTURE) ×1 IMPLANT
SUT VIC AB 3-0 SH 27X BRD (SUTURE) ×1 IMPLANT
SUT VIC AB 4-0 FS2 27 (SUTURE) ×1 IMPLANT
SUTURE ETHLN 4-0 FS2 18XMF BLK (SUTURE) ×1 IMPLANT
SUTURE MNCRL 4-0 27XMF (SUTURE) ×4 IMPLANT

## 2024-04-09 NOTE — Anesthesia Postprocedure Evaluation (Signed)
 Anesthesia Post Note  Patient: Marissa [name]  Procedure(s) Performed: FUSION, JOINT, INVOLVING METATARSAL BONE (Left: Toe) CORRECTION, HAMMER TOE (Bilateral: Toe) TENOTOMY, FLEXOR (Left: Toe)  Patient location during evaluation: PACU Anesthesia Type: General Level of consciousness: awake and alert Pain management: pain level controlled Vital Signs Assessment: post-procedure vital signs reviewed and stable Respiratory status: spontaneous breathing, nonlabored ventilation, respiratory function stable and patient connected to nasal cannula oxygen Cardiovascular status: blood pressure returned to baseline and stable Postop Assessment: no apparent nausea or vomiting Anesthetic complications: no   No notable events documented.   Last Vitals:  Vitals:   04/09/24 1545 04/09/24 1557  BP: (!) 154/91 (!) 150/91  Pulse: (!) 102 100  Resp: 13 15  Temp:  36.6 C  SpO2: 100% 100%    Last Pain:  Vitals:   04/09/24 1557  TempSrc:   PainSc: 4                  Lendia LITTIE Mae

## 2024-04-09 NOTE — Anesthesia Procedure Notes (Signed)
 Procedure Name: LMA Insertion Date/Time: 04/09/2024 1:17 PM  Performed by: Levy Harvey, CRNAPre-anesthesia Checklist: Patient identified, Emergency Drugs available, Suction available, Timeout performed and Patient being monitored Patient Re-evaluated:Patient Re-evaluated prior to induction Oxygen Delivery Method: Circle system utilized Preoxygenation: Pre-oxygenation with 100% oxygen Induction Type: IV induction LMA: LMA inserted LMA Size: 4.0 Number of attempts: 1 Placement Confirmation: positive ETCO2 and breath sounds checked- equal and bilateral Tube secured with: Tape

## 2024-04-09 NOTE — OR Nursing (Signed)
 Patient presents to the OR with false eye lashes. Patient made aware there is a risk for corneal abrasion. Patient voices understanding.  Patient's eyelids were padded with 2x2 nonwoven sterile all purpose sponge prior to being taped shut by CRNA.    Morna SHAUNNA Arenas, RN

## 2024-04-09 NOTE — Transfer of Care (Signed)
 Immediate Anesthesia Transfer of Care Note  Patient: Marissa [name]  Procedure(s) Performed: FUSION, JOINT, INVOLVING METATARSAL BONE (Left: Toe) CORRECTION, HAMMER TOE (Bilateral: Toe) TENOTOMY, FLEXOR (Left: Toe)  Patient Location: PACU  Anesthesia Type: General LMA  Level of Consciousness: awake, alert  and patient cooperative  Airway and Oxygen Therapy: Patient Spontanous Breathing and Patient connected to supplemental oxygen  Post-op Assessment: Post-op Vital signs reviewed, Patient's Cardiovascular Status Stable, Respiratory Function Stable, Patent Airway and No signs of Nausea or vomiting  Post-op Vital Signs: Reviewed and stable  Complications: No notable events documented.

## 2024-04-09 NOTE — H&P (Signed)
 HISTORY AND PHYSICAL INTERVAL NOTE:  04/09/2024  12:42 PM  Marissa [name]  has presented today for surgery, with the diagnosis of Hallux valgus of left foot  Hammer toe of right foot  Controlled type 2 diabetes mellitus without complication, with long-term current use of insulin .  The various methods of treatment have been discussed with the patient.  No guarantees were given.  After consideration of risks, benefits and other options for treatment, the patient has consented to surgery.  I have reviewed the patients' chart and labs.     A history and physical examination was performed in my office.  The patient was reexamined.  There have been no changes to this history and physical examination.  Ashley Soulier A

## 2024-04-09 NOTE — Op Note (Addendum)
 Operative note   Surgeon:Javonta Gronau [employer] logistics/support/administrative officer: None    Preop diagnosis: 1.  Hallux valgus deformity left foot 2.  Hammertoe contracture left second toe 3.  Hammertoe contracture left third toe 4.  Hammertoe contracture left fourth toe 5.  Hammertoe contracture left fifth toe 6.  Hammertoe contracture right second toe    Postop diagnosis: Same    Procedure: 1.  Arthrodesis left first MTPJ 2.  PIPJ arthrodesis left second toe with K wire stabilization 3.  Flexor tenotomy left third toe 4.  Flexor tenotomy left fourth toe 5.  PIPJ arthroplasty left fifth toe 6.  DIPJ and PIPJ arthrodesis right second toe 7.  Dorsal medial and lateral capsulotomy right second MTPJ 8.  Intraoperative fluoroscopy    EBL: Minimal    Anesthesia:local and general.  Local consisted of a total of 20 cc of one-to-one mixture of 0.25% plain bupivacaine  and Exparel  long-acting anesthetic infiltrated along all surgical sites    Hemostasis: Mid calf tourniquet inflated to 200 mmHg on the left side for 91 minutes and ankle tourniquet inflated to 200 mmHg on the right ankle for approximately 25 minutes    Specimen: None    Complications: None    Operative indications:Marissa [name] is an 48 y.o. that presents today for surgical intervention.  The risks/benefits/alternatives/complications have been discussed and consent has been given.    Procedure:  Patient was brought into the OR and placed on the operating table in thesupine position. After anesthesia was obtained the bilateral lower extremity was prepped and draped in usual sterile fashion.  Attention was initially directed to the left first MTPJ where a dorsal incision was performed.  Sharp and blunt dissection carried down to the joint.  The joint was exposed and a marked amount of para-articular spurring was noted around the joint with areas of severe thinning and loss of cartilage of the metatarsophalangeal joint.  At this time decision was made to perform an  arthrodesis.  All para-articular spurring was removed.  The dorsomedial eminence was transected with a power saw.  Next with a cup and cone reamer the joint was prepared.  Final flush was performed to the joint itself.  2.0 mm drill bit was performed to prepare the joint for fusion.  The great toe was held in a neutral position with about 5 degrees of dorsiflexion and 5 degrees of valgus positioning.  This was stabilized with a K wire and a small plate from the [employer] set was applied using 2.5 millimeter screws.  Good alignment was noted in all planes.  Wound closure was performed with 3-0 Vicryl the capsule layer and periosteum.  4-0 Vicryl the subcutaneous tissue and a 4-0 Monocryl for the skin  Attention was then directed to the second toe where the PIPJ was open with a longitudinal incision.  A extensor tenotomy with lengthening was performed.  The head of the proximal phalanx and base of the middle phalanx were exposed.  The head of the proximal phalanx at the level of the surgical neck was excised as well as a small amount of the base of the proximal phalanx.  A 0.045 Arthrex K wire was used from the middle phalanx through the tip of the toe retrograded back to the base of the proximal phalanx with good alignment and shortening of the toe noted.  The extensor tendon was reapproximated with a 4-0 Vicryl as well as the subcutaneous tissue.  The skin was reapproximated with a 3-0 nylon.  Next the long flexor tendon of the 3rd and 4th toes were incised.  Small incisions were made.  Blunt dissection carried down to the flexor tendons.  These were then released fully.  The 3rd and 4th toes were in a much better realign position.  The skin was reapproximated with 3-0 nylon's.  A arthroplasty of the fifth toe was performed next on the left foot.  2 semielliptical incisions were performed around the prominent callused area.  Full-thickness flaps were created.  The head of the proximal phalanx was then  exposed and removed with a surgical saw.  The rotation was performed with the skin the extensor tendon was reapproximated with a 4-0 Vicryl and the skin reapproximated with a 3-0 nylon.  At this time the left foot dressings were applied and covered.  The tourniquet was released.  Attention was directed to the right foot where the tourniquet was inflated.  A longitudinal incision was initially performed over the DIPJ and PIPJ of the second toe.  These joints were exposed.  The extensor tendons were reflected proximal and distal.  The head of the proximal phalanx and base of the middle phalanx were removed with a power saw.  The DIPJ was exposed and the head of the middle phalanx was excised.  The wound was flushed with copious amounts of irrigation.  A 0.045 K wire was driven from the base of the middle phalanx crossing the DIPJ through the tip of the toe and retrograded back to the base of the proximal phalanx.  Good alignment was noted.  The toe was sitting in a slightly contracted position.  At this time the decision was made to release the capsule of the metatarsophalangeal joint.  The incision was taken proximal.  Extensor tendon was reflected laterally.  The dorsal medial lateral capsule was released.  Next a extensor tendon lengthening was performed and the toe was sitting in a much better aligned position.  All wounds were flushed with copious amounts of irrigation.  The extensor tendon was reanastomosed with a 4-0 Vicryl as well as the subcutaneous tissue.  The skin reanastomosed with a 3-0 nylon.  Bulky sterile bandage was applied to the right foot.  Intraoperative fluoroscopy was used throughout the entire procedure to realignment of all sites.    Patient tolerated the procedure and anesthesia well.  Was transported from the OR to the PACU with all vital signs stable and vascular status intact. To be discharged per routine protocol.  Will follow up in approximately 1 week in the outpatient  clinic.

## 2024-04-10 ENCOUNTER — Encounter: Payer: Self-pay | Admitting: Podiatry

## 2024-05-26 ENCOUNTER — Other Ambulatory Visit: Payer: Self-pay | Admitting: Family Medicine

## 2024-05-26 DIAGNOSIS — Z1231 Encounter for screening mammogram for malignant neoplasm of breast: Secondary | ICD-10-CM

## 2024-05-28 ENCOUNTER — Other Ambulatory Visit: Payer: Self-pay | Admitting: *Deleted

## 2024-05-28 ENCOUNTER — Inpatient Hospital Stay
Admission: RE | Admit: 2024-05-28 | Discharge: 2024-05-28 | Disposition: A | Discharge status: Home / Self Care | Payer: Self-pay | Source: Ambulatory Visit | Attending: Family Medicine | Admitting: Family Medicine

## 2024-05-28 ENCOUNTER — Encounter: Payer: Self-pay | Admitting: Nurse Practitioner

## 2024-05-28 DIAGNOSIS — Z1231 Encounter for screening mammogram for malignant neoplasm of breast: Secondary | ICD-10-CM

## 2024-06-17 ENCOUNTER — Encounter: Payer: Self-pay | Admitting: Nurse Practitioner

## 2024-06-24 ENCOUNTER — Ambulatory Visit
Admission: RE | Admit: 2024-06-24 | Discharge: 2024-06-24 | Disposition: A | Discharge status: Home / Self Care | Source: Ambulatory Visit | Attending: Family Medicine | Admitting: Family Medicine

## 2024-06-24 ENCOUNTER — Encounter: Payer: Self-pay | Admitting: Nurse Practitioner

## 2024-06-24 DIAGNOSIS — Z1231 Encounter for screening mammogram for malignant neoplasm of breast: Secondary | ICD-10-CM | POA: Diagnosis present

## 2024-07-07 ENCOUNTER — Other Ambulatory Visit: Payer: Self-pay

## 2024-07-07 DIAGNOSIS — D509 Iron deficiency anemia, unspecified: Secondary | ICD-10-CM

## 2024-07-07 DIAGNOSIS — E538 Deficiency of other specified B group vitamins: Secondary | ICD-10-CM

## 2024-07-08 ENCOUNTER — Inpatient Hospital Stay: Attending: Oncology

## 2024-07-08 DIAGNOSIS — Z79899 Other long term (current) drug therapy: Secondary | ICD-10-CM | POA: Diagnosis not present

## 2024-07-08 DIAGNOSIS — E538 Deficiency of other specified B group vitamins: Secondary | ICD-10-CM | POA: Diagnosis not present

## 2024-07-08 DIAGNOSIS — D509 Iron deficiency anemia, unspecified: Secondary | ICD-10-CM | POA: Diagnosis present

## 2024-07-08 LAB — CMP (CANCER CENTER ONLY)
ALT: 19 U/L (ref 0–44)
AST: 30 U/L (ref 15–41)
Albumin: 4.1 g/dL (ref 3.5–5.0)
Alkaline Phosphatase: 112 U/L (ref 38–126)
Anion gap: 10 (ref 5–15)
BUN: 8 mg/dL (ref 6–20)
CO2: 26 mmol/L (ref 22–32)
Calcium: 9.5 mg/dL (ref 8.9–10.3)
Chloride: 102 mmol/L (ref 98–111)
Creatinine: 0.85 mg/dL (ref 0.44–1.00)
GFR, Estimated: >60 mL/min (ref >60)
Glucose, Bld: 94 mg/dL (ref 70–99)
Potassium: 4.4 mmol/L (ref 3.5–5.1)
Sodium: 138 mmol/L (ref 135–145)
Total Bilirubin: 0.7 mg/dL (ref 0.0–1.2)
Total Protein: 7.7 g/dL (ref 6.5–8.1)

## 2024-07-08 LAB — CBC WITH DIFFERENTIAL (CANCER CENTER ONLY)
Abs Immature Granulocytes: 0.01 K/uL (ref 0.00–0.07)
Basophils Absolute: 0 K/uL (ref 0.0–0.1)
Basophils Relative: 0 %
Eosinophils Absolute: 0.1 K/uL (ref 0.0–0.5)
Eosinophils Relative: 1 %
HCT: 40.1 % (ref 36.0–46.0)
Hemoglobin: 13.3 g/dL (ref 12.0–15.0)
Immature Granulocytes: 0 %
Lymphocytes Relative: 30 %
Lymphs Abs: 1.1 K/uL (ref 0.7–4.0)
MCH: 30 pg (ref 26.0–34.0)
MCHC: 33.2 g/dL (ref 30.0–36.0)
MCV: 90.3 fL (ref 80.0–100.0)
Monocytes Absolute: 0.3 K/uL (ref 0.1–1.0)
Monocytes Relative: 7 %
Neutro Abs: 2.3 K/uL (ref 1.7–7.7)
Neutrophils Relative %: 62 %
Platelet Count: 173 K/uL (ref 150–400)
RBC: 4.44 MIL/uL (ref 3.87–5.11)
RDW: 13.6 % (ref 11.5–15.5)
WBC Count: 3.7 K/uL — ABNORMAL LOW (ref 4.0–10.5)
nRBC: 0 % (ref 0.0–0.2)

## 2024-07-08 LAB — IRON AND TIBC
Iron: 97 ug/dL (ref 28–170)
Saturation Ratios: 23 % (ref 10.4–31.8)
TIBC: 424 ug/dL (ref 250–450)
UIBC: 328 ug/dL

## 2024-07-15 ENCOUNTER — Inpatient Hospital Stay: Attending: Oncology | Admitting: Oncology

## 2024-07-15 ENCOUNTER — Inpatient Hospital Stay

## 2024-07-15 ENCOUNTER — Encounter: Payer: Self-pay | Admitting: Oncology

## 2024-07-15 VITALS — BP 101/73 | HR 83 | Temp 96.0°F | Resp 18 | Wt 151.5 lb

## 2024-07-15 DIAGNOSIS — Z881 Allergy status to other antibiotic agents status: Secondary | ICD-10-CM | POA: Insufficient documentation

## 2024-07-15 DIAGNOSIS — Z882 Allergy status to sulfonamides status: Secondary | ICD-10-CM | POA: Insufficient documentation

## 2024-07-15 DIAGNOSIS — Z90722 Acquired absence of ovaries, bilateral: Secondary | ICD-10-CM | POA: Insufficient documentation

## 2024-07-15 DIAGNOSIS — Z8701 Personal history of pneumonia (recurrent): Secondary | ICD-10-CM | POA: Insufficient documentation

## 2024-07-15 DIAGNOSIS — I2510 Atherosclerotic heart disease of native coronary artery without angina pectoris: Secondary | ICD-10-CM | POA: Diagnosis not present

## 2024-07-15 DIAGNOSIS — Z8249 Family history of ischemic heart disease and other diseases of the circulatory system: Secondary | ICD-10-CM | POA: Insufficient documentation

## 2024-07-15 DIAGNOSIS — Z880 Allergy status to penicillin: Secondary | ICD-10-CM | POA: Diagnosis not present

## 2024-07-15 DIAGNOSIS — Z886 Allergy status to analgesic agent status: Secondary | ICD-10-CM | POA: Insufficient documentation

## 2024-07-15 DIAGNOSIS — G4730 Sleep apnea, unspecified: Secondary | ICD-10-CM | POA: Diagnosis not present

## 2024-07-15 DIAGNOSIS — Z833 Family history of diabetes mellitus: Secondary | ICD-10-CM | POA: Diagnosis not present

## 2024-07-15 DIAGNOSIS — Z823 Family history of stroke: Secondary | ICD-10-CM | POA: Diagnosis not present

## 2024-07-15 DIAGNOSIS — Z803 Family history of malignant neoplasm of breast: Secondary | ICD-10-CM | POA: Insufficient documentation

## 2024-07-15 DIAGNOSIS — Z818 Family history of other mental and behavioral disorders: Secondary | ICD-10-CM | POA: Diagnosis not present

## 2024-07-15 DIAGNOSIS — Z9049 Acquired absence of other specified parts of digestive tract: Secondary | ICD-10-CM | POA: Diagnosis not present

## 2024-07-15 DIAGNOSIS — Z9884 Bariatric surgery status: Secondary | ICD-10-CM | POA: Insufficient documentation

## 2024-07-15 DIAGNOSIS — Z801 Family history of malignant neoplasm of trachea, bronchus and lung: Secondary | ICD-10-CM | POA: Diagnosis not present

## 2024-07-15 DIAGNOSIS — D509 Iron deficiency anemia, unspecified: Secondary | ICD-10-CM | POA: Diagnosis present

## 2024-07-15 DIAGNOSIS — D500 Iron deficiency anemia secondary to blood loss (chronic): Secondary | ICD-10-CM

## 2024-07-15 DIAGNOSIS — E538 Deficiency of other specified B group vitamins: Secondary | ICD-10-CM | POA: Diagnosis not present

## 2024-07-15 DIAGNOSIS — Z8673 Personal history of transient ischemic attack (TIA), and cerebral infarction without residual deficits: Secondary | ICD-10-CM | POA: Diagnosis not present

## 2024-07-15 DIAGNOSIS — I252 Old myocardial infarction: Secondary | ICD-10-CM | POA: Insufficient documentation

## 2024-07-15 DIAGNOSIS — I10 Essential (primary) hypertension: Secondary | ICD-10-CM | POA: Insufficient documentation

## 2024-07-15 DIAGNOSIS — Z813 Family history of other psychoactive substance abuse and dependence: Secondary | ICD-10-CM | POA: Insufficient documentation

## 2024-07-15 DIAGNOSIS — Z59868 Other specified financial insecurity: Secondary | ICD-10-CM | POA: Diagnosis not present

## 2024-07-15 DIAGNOSIS — Z79899 Other long term (current) drug therapy: Secondary | ICD-10-CM | POA: Insufficient documentation

## 2024-07-15 DIAGNOSIS — Z83438 Family history of other disorder of lipoprotein metabolism and other lipidemia: Secondary | ICD-10-CM | POA: Diagnosis not present

## 2024-07-15 DIAGNOSIS — Z8711 Personal history of peptic ulcer disease: Secondary | ICD-10-CM | POA: Diagnosis not present

## 2024-07-15 DIAGNOSIS — R5383 Other fatigue: Secondary | ICD-10-CM | POA: Insufficient documentation

## 2024-07-15 LAB — FERRITIN: Ferritin: 56 ng/mL (ref 11–307)

## 2024-07-15 LAB — FOLATE: Folate: 17.9 ng/mL (ref >5.9)

## 2024-07-15 LAB — VITAMIN B12: Vitamin B-12: 343 pg/mL (ref 180–914)

## 2024-07-15 LAB — TSH: TSH: 1.22 u[IU]/mL (ref 0.350–4.500)

## 2024-07-15 LAB — VITAMIN D 25 HYDROXY (VIT D DEFICIENCY, FRACTURES): Vit D, 25-Hydroxy: 48.28 ng/mL (ref 30–100)

## 2024-07-15 NOTE — Assessment & Plan Note (Addendum)
 History of gastric sleeve. Lab results were reviewed and discussed with patient. Lab Results  Component Value Date   HGB 13.3 07/08/2024   TIBC 424 07/08/2024   IRONPCTSAT 23 07/08/2024   FERRITIN 56 07/15/2024     Patient has normal hemoglobin and normal iron  panel.  I will hold off IV Venofer  treatments for now.

## 2024-07-15 NOTE — Progress Notes (Signed)
 Hematology/Oncology Progress note [phone] [phone]      Patient Care Team: Alla Amis, MD as PCP - General (Family Medicine) Andrew, Camellia Schanz, MD as Consulting Physician (Cardiology) Onetha Kuba, MD as Consulting Physician (Neurosurgery) Babara Call, MD as Consulting Physician (Hematology and Oncology) Babara Call, MD as Consulting Physician (Oncology)  CHIEF COMPLAINTS/REASON FOR VISIT:  Follow up of iron  deficiency anemia, B12 deficiency,   ASSESSMENT & PLAN:   IDA (iron  deficiency anemia) History of gastric sleeve. Lab results were reviewed and discussed with patient. Lab Results  Component Value Date   HGB 13.3 07/08/2024   TIBC 424 07/08/2024   IRONPCTSAT 23 07/08/2024   FERRITIN 56 07/15/2024     Patient has normal hemoglobin and normal iron  panel.  I will hold off IV Venofer  treatments for now.  Vitamin B12 deficiency Previously on B12 injections.  B12 level is pending. Recommend sublingual B12 1000mcg   Other fatigue Check TSH, B12, Vitamin D  level.  Orders Placed This Encounter  Procedures   Vitamin B12    Standing Status:   Future    Number of Occurrences:   1    Expected Date:   07/15/2024    Expiration Date:   10/13/2024   Folate    Standing Status:   Future    Number of Occurrences:   1    Expected Date:   07/15/2024    Expiration Date:   10/13/2024   TSH    Standing Status:   Future    Number of Occurrences:   1    Expected Date:   07/15/2024    Expiration Date:   10/13/2024   VITAMIN D  25 Hydroxy (Vit-D Deficiency, Fractures)    Standing Status:   Future    Number of Occurrences:   1    Expected Date:   07/15/2024    Expiration Date:   10/13/2024   Ferritin    Standing Status:   Future    Number of Occurrences:   1    Expected Date:   07/15/2024    Expiration Date:   10/13/2024   Follow-up in 6 months. All questions were answered. The patient knows to call the clinic with any problems, questions or concerns.  Call Babara, MD, PhD [facility] Hematology Oncology 07/15/2024   HISTORY OF PRESENTING ILLNESS:  Marissa [name] is a  48 y.o.  female with PMH listed below was seen in consultation at the request of Alla Amis, MD  for evaluation of iron  deficiency anemia.   Reviewed patient's recent labs  09/23/2019 labs revealed anemia with hemoglobin of 9.4, MCV 78.7.  Patient was referred to [facility] emergency room on 09/24/2019 for evaluation and IV iron  infusion.  Patient reports approximately 4 weeks.  Of generalized fatigue, dizziness and feeling cold. Reviewed patient's previous labs ordered by primary care physician's office, anemia is chronic onset, since at least 2014. I will have any recent iron  panel.  02/01/2024 iron  saturation 10, ferritin 106. Patient received IV Feraheme  and he tolerates well. 09/27/2019, patient went to [facility] ER for evaluation of epigastric pain.  She had CT abdomen pelvis done which showed no acute pathology within the abdomen or pelvis. Associated signs and symptoms: Patient reports fatigue, dizziness and feeling cold.  Denies SOB with exertion.  Denies weight loss, easy bruising, hematochezia, hemoptysis, hematuria. Context:  History of iron  deficiency:  Rectal bleeding: Denies Menstrual bleeding/ Vaginal bleeding : LMP 12/21/2014, history of salpingo-oophorectomy bilateral. Hematemesis or hemoptysis : denies  Blood in urine : denies  Last endoscopy: Patient had a history of laparoscopic gastric sleeve  April 2021 at [facility] due to abdominal pain, AKI. and had enteroscopy done on 12/11/2019. No specimen was collected. The area of ulceration and stenosis could not be reached with this antegrade approach. 12/15/2019 enteroscopy examined portion of the ileum was normal, diverticulosis in the left colon.  01/07/2020 he was seen by Dr.Bruce-Bariatric Surgery.  Per patient there was plan of laparoscopic adhesion lysis procedure.   01/14/2021, patient had abdominal wall lipoma excision.  Patient  experienced chronic ongoing bleeding from the surgical incision.  She  had work-up there including a CT scan which showed large fluid collection consistent with seroma versus hematoma.  Patient's hemoglobin also dropped to 8.9.  Patient reports feeling extremely weak and fatigued and was seen by symptom management clinic [facility].  INTERVAL HISTORY Marissa [name] is a 48 y.o. female who has above history reviewed by me today presents for follow up visit for management of iron  deficiency anemia Patient was last seen in Jan 2024. Today she presents to re establish care.  She feels tired and cold.  Previously patient gets vitamin B12 injections. Recently she has been off B12 injections or supplementation.     Review of Systems  Constitutional:  Positive for fatigue. Negative for appetite change, chills and fever.  HENT:   Negative for hearing loss and voice change.   Eyes:  Negative for eye problems.  Respiratory:  Negative for chest tightness, cough and shortness of breath.   Cardiovascular:  Negative for chest pain.  Gastrointestinal:  Negative for abdominal distention, abdominal pain and blood in stool.  Endocrine: Negative for hot flashes.  Genitourinary:  Negative for difficulty urinating and frequency.   Musculoskeletal:  Negative for arthralgias and neck pain.  Skin:  Negative for itching and rash.  Neurological:  Negative for extremity weakness.  Hematological:  Negative for adenopathy.  Psychiatric/Behavioral:  Negative for confusion.     MEDICAL HISTORY:  Past Medical History:  Diagnosis Date   ADHD (attention deficit hyperactivity disorder)    ADD   Allergy    Anemia    Arthritis    Back pain, chronic    Cardiomyopathy, dilated, nonischemic (HCC)    CHF (congestive heart failure) (HCC)    Cholelithiasis    Chronic kidney disease    Coronary artery disease    Crohn's disease (HCC)    Diabetes mellitus without complication (HCC)    fasting cbg 50-140s    Dysrhythmia    tachycardia   GERD (gastroesophageal reflux disease)    otc meds   Headache(784.0)    History of bariatric surgery    lost 200 pounds   Hypercholesteremia    Hyperlipemia    Hypertension    IDA (iron  deficiency anemia) 09/29/2019   Insulin  pump in place    pt had insulin  pump but it is now removed (02-18-16)   MI, old    2006   Migraines    once/month maybe - can last up to two weeks   Myocardial infarction [facility]) 2006   due to medication; no evidence of ischemia or infarction by nuclear stress test '11   Neck pain, chronic    Neuromuscular disorder (HCC)    neuropathy both legs   Neuropathy    legs   Pneumonia 2015   [facility]   PONV (postoperative nausea and vomiting)    Prolonged QT interval syndrome    PUD (peptic ulcer disease)  Renal insufficiency    Restless leg syndrome, controlled    Sepsis (HCC) 2016   secondary to surgery   Sleep apnea    does not use cpap since losing alot of weight   Stroke (HCC) 06/2021   Vertigo    nop episodes 2020   Vertigo    Vision loss    due to diabetes   Wears contact lenses    Wears dentures    partial lower    SURGICAL HISTORY: Past Surgical History:  Procedure Laterality Date   ACHILLES TENDON SURGERY Right 04/22/2019   Procedure: [facility] PROCEDURE WITH SUTURE ANCHOR;  Surgeon: Lennie Barter, DPM;  Location: [facility];  Service: Podiatry;  Laterality: Right;   APPENDECTOMY N/A 03/08/2018   Procedure: APPENDECTOMY;  Surgeon: Verdon Keen, MD;  Location: [facility] ORS;  Service: Gynecology;  Laterality: N/A;  By Dr. Rodolph   ARTHRODESIS METATARSAL Left 04/09/2024   Procedure: FUSION, JOINT, INVOLVING METATARSAL BONE;  Surgeon: Ashley Soulier, DPM;  Location: [facility] SURGERY CNTR;  Service: Orthopedics/Podiatry;  Laterality: Left;  Left Great Toe Joint   BACK SURGERY  2011   Lumbar    BOWEL RESECTION N/A 03/08/2018   Procedure: SMALL BOWEL RESECTION;  Surgeon: Verdon Keen, MD;  Location: [facility] ORS;   Service: Gynecology;  Laterality: N/A;   CARDIAC CATHETERIZATION  07/18/2004   50-60% mid LAD, minor luminal irregularities RCA, normal LM and CX, EF 50-55% [facility])   carpel tunnel Bilateral    CERVICAL FUSION  2006   CHOLECYSTECTOMY N/A 04/08/2015   Procedure: LAPAROSCOPIC CHOLECYSTECTOMY WITH INTRAOPERATIVE CHOLANGIOGRAM;  Surgeon: Charlie FORBES Fell, MD;  Location: [facility] ORS;  Service: General;  Laterality: N/A;   COLON RESECTION N/A 03/16/2018   Procedure: COLON RESECTION;  Surgeon: Fell Charlie FORBES, MD;  Location: [facility] ORS;  Service: General;  Laterality: N/A;   COLONOSCOPY WITH PROPOFOL  N/A 10/22/2019   Procedure: COLONOSCOPY WITH PROPOFOL ;  Surgeon: Toledo, Ladell POUR, MD;  Location: [facility] ENDOSCOPY;  Service: Gastroenterology;  Laterality: N/A;  [facility] DID RAPID TEST; COPY ON CHART   ESOPHAGOGASTRODUODENOSCOPY (EGD) WITH PROPOFOL  N/A 10/22/2019   Procedure: ESOPHAGOGASTRODUODENOSCOPY (EGD) WITH PROPOFOL ;  Surgeon: Toledo, Ladell POUR, MD;  Location: [facility] ENDOSCOPY;  Service: Gastroenterology;  Laterality: N/A;   FLEXOR TENOTOMY  Left 04/09/2024   Procedure: TENOTOMY, FLEXOR;  Surgeon: Ashley Soulier, DPM;  Location: [facility];  Service: Orthopedics/Podiatry;  Laterality: Left;  Left Third and Fourth Toes   HAMMER TOE SURGERY Right 04/22/2019   Procedure: HAMMER TOE CORRECTION X 4;  Surgeon: Lennie Barter, DPM;  Location: [facility];  Service: Podiatry;  Laterality: Right;  Diabetic - insulin  and oral meds   HAMMER TOE SURGERY Bilateral 04/09/2024   Procedure: CORRECTION, HAMMER TOE;  Surgeon: Ashley Soulier, DPM;  Location: [facility];  Service: Orthopedics/Podiatry;  Laterality: Bilateral;  RIGHT FOOT 2ND TOE, LEFT FOOT 2ND & FIFTH TOES   LAPAROSCOPIC GASTRECTOMY  05/31/2017   [facility], Dr. Wolm   LUMBAR LAMINECTOMY/DECOMPRESSION MICRODISCECTOMY Right 05/07/2013   Procedure: Right Lumbar one-two laminectomy;  Surgeon: Arley SHAUNNA Helling, MD;  Location: [facility] NEURO  [facility];  Service: Neurosurgery;  Laterality: Right;   LUMBAR LAMINECTOMY/DECOMPRESSION MICRODISCECTOMY Left 10/29/2013   Procedure: Left Lumbar five-Sacral one Laminectomy;  Surgeon: Arley SHAUNNA Helling, MD;  Location: [facility];  Service: Neurosurgery;  Laterality: Left;   LUMBAR WOUND DEBRIDEMENT N/A 01/25/2014   Procedure: LUMBAR WOUND DEBRIDEMENT;  Surgeon: Victory DELENA Gunnels, MD;  Location: [facility];  Service: Neurosurgery;  Laterality:  N/A;   LUMBAR WOUND DEBRIDEMENT N/A 02/25/2014   Procedure: LUMBAR WOUND DEBRIDEMENT;  Surgeon: Arley SHAUNNA Helling, MD;  Location: [facility];  Service: Neurosurgery;  Laterality: N/A;   LYSIS OF ADHESION N/A 03/08/2018   Procedure: LYSIS OF ADHESION;  Surgeon: Verdon Keen, MD;  Location: [facility] ORS;  Service: Gynecology;  Laterality: N/A;   SALPINGOOPHORECTOMY Bilateral    1 1997. 2nd 2001   SHOULDER ARTHROSCOPY WITH BICEPSTENOTOMY Left 03/24/2016   Procedure: shoulder arthroscopy with biceps TENOTOMY, removal loose body, limited synovectomy;  Surgeon: Helayne Glenn, MD;  Location: [facility] ORS;  Service: Orthopedics;  Laterality: Left;   shoulder sugery     7/17 rotator cuff   SMALL BOWEL REPAIR N/A 03/08/2018   Procedure: SMALL BOWEL REPAIR;  Surgeon: Verdon Keen, MD;  Location: [facility] ORS;  Service: Gynecology;  Laterality: N/A;    SOCIAL HISTORY: Social History   Socioeconomic History   Marital status: Widowed    Spouse name: Not on file   Number of children: 0   Years of education: College   Highest education level: Not on file  Occupational History   Occupation: Print Production Planner  Tobacco Use   Smoking status: Never    Passive exposure: Never   Smokeless tobacco: Never  Vaping Use   Vaping status: Never Used  Substance and Sexual Activity   Alcohol  use: No   Drug use: No   Sexual activity: Yes    Birth control/protection: Surgical  Other Topics Concern   Not on file  Social History Narrative   Lives at home with her fiance.   Right-handed.    Occasional caffeine  use.   Social Drivers of Corporate Investment Banker Strain: High Risk (05/26/2024)   Received from [facility]   Overall Financial Resource Strain (CARDIA)    Difficulty of Paying Living Expenses: Hard  Food Insecurity: No Food Insecurity (05/26/2024)   Received from [facility]   Hunger Vital Sign    Within the past 12 months, you worried that your food would run out before you got the money to buy more.: Never true    Within the past 12 months, the food you bought just didn't last and you didn't have money to get more.: Never true  Transportation Needs: No Transportation Needs (05/26/2024)   Received from [facility] - Transportation    In the past 12 months, has lack of transportation kept you from medical appointments or from getting medications?: No    Lack of Transportation (Non-Medical): No  Physical Activity: Not on file  Stress: Not on file  Social Connections: Unknown (08/16/2018)   Received from [facility]   Social Connection and Isolation Panel    Frequency of Communication with Friends and Family: Not on file    Frequency of Social Gatherings with Friends and Family: Not on file    Attends Religious Services: Not on file    Active Member of Clubs or Organizations: Not on file    Attends Club or Organization Meetings: Not on file    Are you married, widowed, divorced, separated, never married, or living with a partner?: Married  Intimate Partner Violence: Not At Risk (05/22/2023)   Humiliation, Afraid, Rape, and Kick questionnaire    Fear of Current or Ex-Partner: No    Emotionally Abused: No    Physically Abused: No    Sexually Abused: No    FAMILY HISTORY: Family History  Problem Relation  Age of Onset   Depression Mother    Drug abuse Mother    Early death Mother    Hypertension Mother    Varicose Veins Mother    CVA Mother    Diabetes Father    Early death  Father    Hyperlipidemia Father    Heart Problems Father        enlarged geart   Breast cancer Maternal Grandmother        60's   Breast cancer Maternal Aunt    Lung cancer Maternal Grandfather     ALLERGIES:  is allergic to bactrim  [sulfamethoxazole -trimethoprim ], ciprofloxacin, vancomycin , doxycycline , erythromycin, nsaids, amoxicillin , azithromycin, cefotaxime, ceftriaxone , and food.  MEDICATIONS:  Current Outpatient Medications  Medication Sig Dispense Refill   acetaminophen  (TYLENOL ) 500 MG tablet Take 1,000 mg by mouth 2 (two) times daily as needed for moderate pain or headache.     BIOTIN  PO Take 1 tablet by mouth daily.     estradiol  (ESTRACE ) 0.1 MG/GM vaginal cream Place 1 Applicatorful vaginally at bedtime.     gabapentin  (NEURONTIN ) 600 MG tablet Take 600 mg by mouth See admin instructions. Take 600 mg in the morning, 600 mg in the evening, and 1200 mg at bedtime     insulin  aspart (NOVOLOG ) 100 UNIT/ML injection Inject 2-5 Units into the skin 3 (three) times daily before meals.     NARCAN  4 MG/0.1ML LIQD nasal spray kit Place 1 spray into the nose once. If no response after 2 minutes repeat in opposite nostril.  0   nitroGLYCERIN  (NITROSTAT ) 0.4 MG SL tablet Place 0.4 mg under the tongue every 5 (five) minutes as needed for chest pain.     ondansetron  (ZOFRAN -ODT) 4 MG disintegrating tablet Take 1 tablet (4 mg total) by mouth every 8 (eight) hours as needed for nausea or vomiting. 20 tablet 0   oxycodone  (ROXICODONE ) 30 MG immediate release tablet Take 0.5 tablets (15 mg total) by mouth every 3 (three) hours as needed for pain. 30 tablet 0   oxyCODONE -acetaminophen  (PERCOCET) 5-325 MG tablet Take 1-2 tablets by mouth every 6 (six) hours as needed for severe pain (pain score 7-10). Max 6 tabs per day 30 tablet 0   OZEMPIC , 2 MG/DOSE, 8 MG/3ML SOPN Inject 2 mg into the skin every Wednesday.     QUEtiapine (SEROQUEL) 25 MG tablet Take 25 mg by mouth at bedtime.     tiZANidine   (ZANAFLEX ) 4 MG tablet Take 1 tablet (4 mg total) by mouth 3 (three) times daily as needed for muscle spasms. 30 tablet 0   No current facility-administered medications for this visit.     PHYSICAL EXAMINATION: ECOG PERFORMANCE STATUS: 1 - Symptomatic but completely ambulatory Vitals:   07/15/24 1306  BP: 101/73  Pulse: 83  Resp: 18  Temp: (!) 96 F (35.6 C)  SpO2: 99%   Filed Weights   07/15/24 1306  Weight: 151 lb 8 oz (68.7 kg)    Physical Exam Constitutional:      General: She is not in acute distress. HENT:     Head: Normocephalic and atraumatic.  Eyes:     General: No scleral icterus. Cardiovascular:     Rate and Rhythm: Normal rate.  Pulmonary:     Effort: Pulmonary effort is normal. No respiratory distress.     Breath sounds: No wheezing.  Abdominal:     Comments: Status post excision of lipoma.  Musculoskeletal:        General: No deformity. Normal range of motion.  Cervical back: Normal range of motion and neck supple.  Skin:    General: Skin is warm and dry.     Findings: No erythema or rash.  Neurological:     Mental Status: She is alert and oriented to person, place, and time. Mental status is at baseline.     Cranial Nerves: No cranial nerve deficit.     Coordination: Coordination normal.  Psychiatric:        Mood and Affect: Mood normal.     Labs     Latest Ref Rng & Units 07/08/2024   11:21 AM  CMP  Glucose 70 - 99 mg/dL 94   BUN 6 - 20 mg/dL 8   Creatinine 9.55 - 8.99 mg/dL 9.14   Sodium 864 - 854 mmol/L 138   Potassium 3.5 - 5.1 mmol/L 4.4   Chloride 98 - 111 mmol/L 102   CO2 22 - 32 mmol/L 26   Calcium  8.9 - 10.3 mg/dL 9.5   Total Protein 6.5 - 8.1 g/dL 7.7   Total Bilirubin 0.0 - 1.2 mg/dL 0.7   Alkaline Phos 38 - 126 U/L 112   AST 15 - 41 U/L 30   ALT 0 - 44 U/L 19       Latest Ref Rng & Units 07/08/2024   11:20 AM  CBC  WBC 4.0 - 10.5 K/uL 3.7   Hemoglobin 12.0 - 15.0 g/dL 86.6   Hematocrit 63.9 - 46.0 % 40.1    Platelets 150 - 400 K/uL 173      Iron /TIBC/Ferritin/ %Sat    Component Value Date/Time   IRON  97 07/08/2024 1120   TIBC 424 07/08/2024 1120   FERRITIN 56 07/15/2024 1400   IRONPCTSAT 23 07/08/2024 1120

## 2024-07-15 NOTE — Assessment & Plan Note (Signed)
 Check TSH, B12, Vitamin D  level.

## 2024-07-15 NOTE — Assessment & Plan Note (Addendum)
 Previously on B12 injections.  B12 level is pending. Recommend sublingual B12 1000mcg

## 2024-07-16 ENCOUNTER — Ambulatory Visit: Payer: Self-pay | Admitting: Oncology

## 2024-08-12 ENCOUNTER — Ambulatory Visit
Admission: RE | Admit: 2024-08-12 | Discharge: 2024-08-12 | Disposition: A | Discharge status: Home / Self Care | Source: Ambulatory Visit

## 2024-08-12 ENCOUNTER — Other Ambulatory Visit: Payer: Self-pay

## 2024-08-12 DIAGNOSIS — R1031 Right lower quadrant pain: Secondary | ICD-10-CM | POA: Diagnosis present

## 2024-08-12 DIAGNOSIS — R197 Diarrhea, unspecified: Secondary | ICD-10-CM

## 2024-08-12 DIAGNOSIS — R112 Nausea with vomiting, unspecified: Secondary | ICD-10-CM | POA: Insufficient documentation

## 2024-08-16 ENCOUNTER — Other Ambulatory Visit: Payer: Self-pay

## 2024-08-16 ENCOUNTER — Emergency Department
Admission: EM | Admit: 2024-08-16 | Discharge: 2024-08-16 | Disposition: A | Discharge status: Home / Self Care | Payer: Self-pay | Source: Home / Self Care | Attending: Emergency Medicine | Admitting: Emergency Medicine

## 2024-08-16 ENCOUNTER — Encounter: Payer: Self-pay | Admitting: Nurse Practitioner

## 2024-08-16 DIAGNOSIS — L03311 Cellulitis of abdominal wall: Secondary | ICD-10-CM | POA: Insufficient documentation

## 2024-08-16 DIAGNOSIS — L608 Other nail disorders: Secondary | ICD-10-CM | POA: Insufficient documentation

## 2024-08-16 LAB — COMPREHENSIVE METABOLIC PANEL WITH GFR
ALT: 16 U/L (ref 0–44)
AST: 25 U/L (ref 15–41)
Albumin: 4.1 g/dL (ref 3.5–5.0)
Alkaline Phosphatase: 103 U/L (ref 38–126)
Anion gap: 8 (ref 5–15)
BUN: 10 mg/dL (ref 6–20)
CO2: 29 mmol/L (ref 22–32)
Calcium: 9.5 mg/dL (ref 8.9–10.3)
Chloride: 103 mmol/L (ref 98–111)
Creatinine, Ser: 0.88 mg/dL (ref 0.44–1.00)
GFR, Estimated: >60 mL/min
Glucose, Bld: 132 mg/dL — ABNORMAL HIGH (ref 70–99)
Potassium: 4.3 mmol/L (ref 3.5–5.1)
Sodium: 140 mmol/L (ref 135–145)
Total Bilirubin: 0.5 mg/dL (ref 0.0–1.2)
Total Protein: 6.7 g/dL (ref 6.5–8.1)

## 2024-08-16 LAB — CBC
HCT: 39.1 % (ref 36.0–46.0)
Hemoglobin: 12.6 g/dL (ref 12.0–15.0)
MCH: 29.9 pg (ref 26.0–34.0)
MCHC: 32.2 g/dL (ref 30.0–36.0)
MCV: 92.7 fL (ref 80.0–100.0)
Platelets: 173 K/uL (ref 150–400)
RBC: 4.22 MIL/uL (ref 3.87–5.11)
RDW: 13.8 % (ref 11.5–15.5)
WBC: 3.4 K/uL — ABNORMAL LOW (ref 4.0–10.5)
nRBC: 0 % (ref 0.0–0.2)

## 2024-08-16 LAB — URINALYSIS, ROUTINE W REFLEX MICROSCOPIC
Bilirubin Urine: NEGATIVE
Glucose, UA: 50 mg/dL — AB
Hgb urine dipstick: NEGATIVE
Ketones, ur: NEGATIVE mg/dL
Leukocytes,Ua: NEGATIVE
Nitrite: NEGATIVE
Protein, ur: NEGATIVE mg/dL
Specific Gravity, Urine: 1.019 (ref 1.005–1.030)
pH: 7 (ref 5.0–8.0)

## 2024-08-16 LAB — LIPASE, BLOOD: Lipase: 21 U/L (ref 11–51)

## 2024-08-16 NOTE — ED Triage Notes (Signed)
 Pt to ED for I woke up this morning and my fingertips were purple and RLQ abdominal pain, NVD and constipation since 2 weeks, seen here recently for same. States that for awhile she has been having episodes of numbness to both hands and hands getting stuck with fingers contracted and can't stretch fingers out and her knuckles are always popping.

## 2024-08-16 NOTE — ED Provider Notes (Signed)
 "  [facility] Provider Note    Event Date/Time   First MD Initiated Contact with Patient 08/16/24 1301     (approximate)   History   Abdominal Pain and fingertips purple   HPI  Marissa [name] is a 49 y.o. female who presents with an area of redness on her abdomen where she had a ring placed but it fell out.  She reports mild tenderness at that area.  Additionally when she woke up this morning she noticed darker lines across her fingernails, no pain.  Finally she reports intermittent joint discomfort and locking in her hands, not currently happening     Physical Exam   Triage Vital Signs: ED Triage Vitals  Encounter Vitals Group     BP 08/16/24 1210 108/75     Girls Systolic BP Percentile --      Girls Diastolic BP Percentile --      Boys Systolic BP Percentile --      Boys Diastolic BP Percentile --      Pulse Rate 08/16/24 1210 88     Resp 08/16/24 1344 17     Temp 08/16/24 1210 98.4 F (36.9 C)     Temp Source 08/16/24 1210 Oral     SpO2 08/16/24 1210 99 %     Weight 08/16/24 1215 66.7 kg (147 lb)     Height 08/16/24 1215 1.626 m (5' 4)     Head Circumference --      Peak Flow --      Pain Score 08/16/24 1213 7     Pain Loc --      Pain Education --      Exclude from Growth Chart --     Most recent vital signs: Vitals:   08/16/24 1210 08/16/24 1344  BP: 108/75 (!) 134/91  Pulse: 88 87  Resp:  17  Temp: 98.4 F (36.9 C)   SpO2: 99% 100%     General: Awake, no distress.  CV:  Good peripheral perfusion.  Resp:  Normal effort.  Abd:  No distention.  Area of erythema mild induration, no fluctuance consistent with cellulitis Other:  Dark lines, horizontal midway through the fingernails, question melanonychia, normal cap refill   ED Results / Procedures / Treatments   Labs (all labs ordered are listed, but only abnormal results are displayed) Labs Reviewed  COMPREHENSIVE METABOLIC PANEL WITH GFR - Abnormal; Notable for the  following components:      Result Value   Glucose, Bld 132 (*)    All other components within normal limits  CBC - Abnormal; Notable for the following components:   WBC 3.4 (*)    All other components within normal limits  URINALYSIS, ROUTINE W REFLEX MICROSCOPIC - Abnormal; Notable for the following components:   Color, Urine YELLOW (*)    APPearance CLEAR (*)    Glucose, UA 50 (*)    All other components within normal limits  LIPASE, BLOOD     EKG     RADIOLOGY     PROCEDURES:  Critical Care performed:   Procedures   MEDICATIONS ORDERED IN ED: Medications - No data to display   IMPRESSION / MDM / ASSESSMENT AND PLAN / ED COURSE  I reviewed the triage vital signs and the nursing notes. Patient's presentation is most consistent with acute illness / injury with system symptoms.  Patient presents with symptoms as above, on exam consistent with cellulitis, no evidence of abscess or fluctuance at this time.  Will treat with p.o. Keflex   Lab work reviewed and is quite reassuring  Question possible melanonychia, will have the patient follow-up with dermatology for further evaluation, rheumatology referral for ongoing joint issues.        FINAL CLINICAL IMPRESSION(S) / ED DIAGNOSES   Final diagnoses:  Cellulitis of abdominal wall  Melanonychia     Rx / DC Orders   ED Discharge Orders     None        Note:  This document was prepared using Dragon voice recognition software and may include unintentional dictation errors.   Arlander Charleston, MD 08/16/24 1359  "

## 2024-08-18 ENCOUNTER — Emergency Department
Admission: EM | Admit: 2024-08-18 | Discharge: 2024-08-19 | Disposition: A | Discharge status: Home / Self Care | Payer: Self-pay | Attending: Emergency Medicine | Admitting: Emergency Medicine

## 2024-08-18 ENCOUNTER — Other Ambulatory Visit: Payer: Self-pay

## 2024-08-18 ENCOUNTER — Encounter: Payer: Self-pay | Admitting: Emergency Medicine

## 2024-08-18 DIAGNOSIS — S30851A Superficial foreign body of abdominal wall, initial encounter: Secondary | ICD-10-CM | POA: Insufficient documentation

## 2024-08-18 DIAGNOSIS — M795 Residual foreign body in soft tissue: Secondary | ICD-10-CM

## 2024-08-18 DIAGNOSIS — I509 Heart failure, unspecified: Secondary | ICD-10-CM | POA: Insufficient documentation

## 2024-08-18 DIAGNOSIS — W458XXA Other foreign body or object entering through skin, initial encounter: Secondary | ICD-10-CM | POA: Insufficient documentation

## 2024-08-18 DIAGNOSIS — L0390 Cellulitis, unspecified: Secondary | ICD-10-CM

## 2024-08-18 DIAGNOSIS — I2510 Atherosclerotic heart disease of native coronary artery without angina pectoris: Secondary | ICD-10-CM | POA: Insufficient documentation

## 2024-08-18 DIAGNOSIS — I10 Essential (primary) hypertension: Secondary | ICD-10-CM | POA: Insufficient documentation

## 2024-08-18 DIAGNOSIS — R944 Abnormal results of kidney function studies: Secondary | ICD-10-CM | POA: Insufficient documentation

## 2024-08-18 DIAGNOSIS — E119 Type 2 diabetes mellitus without complications: Secondary | ICD-10-CM | POA: Insufficient documentation

## 2024-08-18 DIAGNOSIS — L03316 Cellulitis of umbilicus: Secondary | ICD-10-CM | POA: Insufficient documentation

## 2024-08-18 LAB — CBC WITH DIFFERENTIAL/PLATELET
Abs Immature Granulocytes: 0.03 K/uL (ref 0.00–0.07)
Basophils Absolute: 0 K/uL (ref 0.0–0.1)
Basophils Relative: 0 %
Eosinophils Absolute: 0 K/uL (ref 0.0–0.5)
Eosinophils Relative: 0 %
HCT: 42.3 % (ref 36.0–46.0)
Hemoglobin: 13.6 g/dL (ref 12.0–15.0)
Immature Granulocytes: 1 %
Lymphocytes Relative: 26 %
Lymphs Abs: 1.5 K/uL (ref 0.7–4.0)
MCH: 29.8 pg (ref 26.0–34.0)
MCHC: 32.2 g/dL (ref 30.0–36.0)
MCV: 92.6 fL (ref 80.0–100.0)
Monocytes Absolute: 0.4 K/uL (ref 0.1–1.0)
Monocytes Relative: 7 %
Neutro Abs: 3.8 K/uL (ref 1.7–7.7)
Neutrophils Relative %: 66 %
Platelets: 186 K/uL (ref 150–400)
RBC: 4.57 MIL/uL (ref 3.87–5.11)
RDW: 13.5 % (ref 11.5–15.5)
WBC: 5.7 K/uL (ref 4.0–10.5)
nRBC: 0 % (ref 0.0–0.2)

## 2024-08-18 LAB — COMPREHENSIVE METABOLIC PANEL WITH GFR
ALT: 28 U/L (ref 0–44)
AST: 46 U/L — ABNORMAL HIGH (ref 15–41)
Albumin: 4.6 g/dL (ref 3.5–5.0)
Alkaline Phosphatase: 111 U/L (ref 38–126)
Anion gap: 13 (ref 5–15)
BUN: 11 mg/dL (ref 6–20)
CO2: 26 mmol/L (ref 22–32)
Calcium: 9.7 mg/dL (ref 8.9–10.3)
Chloride: 102 mmol/L (ref 98–111)
Creatinine, Ser: 1.15 mg/dL — ABNORMAL HIGH (ref 0.44–1.00)
GFR, Estimated: 58 mL/min — ABNORMAL LOW
Glucose, Bld: 134 mg/dL — ABNORMAL HIGH (ref 70–99)
Potassium: 4.1 mmol/L (ref 3.5–5.1)
Sodium: 141 mmol/L (ref 135–145)
Total Bilirubin: 0.5 mg/dL (ref 0.0–1.2)
Total Protein: 7.5 g/dL (ref 6.5–8.1)

## 2024-08-18 MED ORDER — ACETAMINOPHEN ER 650 MG PO TBCR: 650.0000 mg | EXTENDED_RELEASE_TABLET | Freq: Three times a day (TID) | ORAL | 0 refills | Status: AC | PRN

## 2024-08-18 MED ORDER — ACETAMINOPHEN 325 MG PO TABS
650.0000 mg | ORAL_TABLET | Freq: Once | ORAL | Status: AC
  Administered 2024-08-18: 650 mg via ORAL
  Filled 2024-08-18: qty 2

## 2024-08-18 MED ORDER — CLINDAMYCIN HCL 300 MG PO CAPS: 300.0000 mg | ORAL_CAPSULE | Freq: Three times a day (TID) | ORAL | 0 refills | Status: AC

## 2024-08-18 MED ORDER — LIDOCAINE HCL (PF) 1 % IJ SOLN: 5.0000 mL | Freq: Once | INTRAMUSCULAR | Status: DC

## 2024-08-18 MED ORDER — LIDOCAINE-EPINEPHRINE 2 %-1:100000 IJ SOLN
20.0000 mL | Freq: Once | INTRAMUSCULAR | Status: AC
  Administered 2024-08-18: 20 mL via INTRADERMAL
  Filled 2024-08-18: qty 1

## 2024-08-18 MED ORDER — FLUCONAZOLE 150 MG PO TABS: 150.0000 mg | ORAL_TABLET | Freq: Every day | ORAL | 0 refills | Status: AC

## 2024-08-18 MED ORDER — CLINDAMYCIN HCL 150 MG PO CAPS
300.0000 mg | ORAL_CAPSULE | Freq: Once | ORAL | Status: AC
  Administered 2024-08-18: 300 mg via ORAL
  Filled 2024-08-18: qty 2

## 2024-08-18 NOTE — ED Provider Notes (Signed)
 "  [facility] Provider Note    Event Date/Time   First MD Initiated Contact with Patient 08/18/24 2127     (approximate)   History   Cellulitis    HPI  Marissa [name] is a 49 y.o. female    with a past medical history of cellulitis of abdominal wall, nauseous and vomiting, diabetes type 2,  who presents to the ED complaining of bilateral abdominal pain with exudate. According to the patient, she had 2 piercings around the umbilicus a year ago, a few days ago they become infected, the skin is warm, red and pus is coming from one of them.    Patient Active Problem List   Diagnosis Date Noted   Spinal stenosis of lumbar region 05/21/2023   Vitamin B12 deficiency 08/16/2022   IDA (iron  deficiency anemia) 09/29/2019   Abdominal wall abscess 04/06/2018   Chronic superficial gastritis without bleeding 04/02/2018   Abscess in epidural space of lumbar spine 04/02/2018   Acute abdomen 03/16/2018   Anastomotic leak of intestine    Peritonitis (HCC)    Pelvic pain 03/08/2018   Impingement syndrome of left shoulder 02/07/2018   Type 2 diabetes mellitus without complication, without long-term current use of insulin  (HCC) 11/28/2017   Other fatigue 11/02/2017   Chronic tension-type headache, intractable 09/19/2017   Primary osteoarthritis of left knee 09/10/2017   Polyarthralgia 09/10/2017   Chronic left shoulder pain 09/10/2017   Restless leg syndrome 05/23/2017   MI, old 05/23/2017   Congestive heart failure (HCC) 05/23/2017   Chronic painful diabetic neuropathy (HCC) 02/06/2017   Diabetes mellitus (HCC) 11/21/2016   Obstructive sleep apnea syndrome 11/21/2016   Dyslipidemia 11/21/2016   Paresthesia 09/06/2016   Primary insomnia 03/14/2016   Complete tear of right rotator cuff 02/01/2016   Cardiomyopathy (HCC) 04/23/2015   Long term current use of antibiotics 04/23/2015   Biliary colic 04/08/2015   Calculus of gallbladder with acute cholecystitis  04/07/2015   Bilateral occipital neuralgia 01/21/2015   Dizziness 01/04/2015   Headache 01/04/2015   DDD (degenerative disc disease), cervical 12/18/2014   DDD (degenerative disc disease), thoracic 12/18/2014   DDD (degenerative disc disease), lumbosacral 12/18/2014   Neuropathy due to secondary diabetes (HCC) 12/18/2014   Migraine 12/18/2014   Hypercholesterolemia without hypertriglyceridemia 08/28/2014   HPV test positive 07/17/2014   Class 2 obesity 04/15/2014   CAD in native artery 04/15/2014   Breathlessness on exertion 03/24/2014   Wound infection complicating hardware 02/25/2014   Nausea alone 02/01/2014   Abdominal pain 02/01/2014   Severe sepsis (HCC) 01/30/2014   History of lumbar laminectomy 01/30/2014   Acute renal failure 01/30/2014   Anemia 01/30/2014   HTN (hypertension) 01/30/2014   DM (diabetes mellitus), type 2 with renal complications (HCC) 01/30/2014   Wound infection 01/25/2014   Acute respiratory failure with hypoxia (HCC) 01/25/2014   Type 2 diabetes mellitus treated with insulin  (HCC) 01/13/2014   Essential (primary) hypertension 01/13/2014   Difficulty speaking 01/13/2014   Clinical depression 01/13/2014   Insulin  dependent type 2 diabetes mellitus (HCC) 01/13/2014   Back pain 11/01/2013   HNP (herniated nucleus pulposus), lumbar 10/29/2013     Physical Exam   Triage Vital Signs: ED Triage Vitals  Encounter Vitals Group     BP 08/18/24 1846 (!) 152/103     Girls Systolic BP Percentile --      Girls Diastolic BP Percentile --      Boys Systolic BP Percentile --  Boys Diastolic BP Percentile --      Pulse Rate 08/18/24 1846 (!) 116     Resp 08/18/24 1846 17     Temp 08/18/24 1846 98.3 F (36.8 C)     Temp Source 08/18/24 1846 Oral     SpO2 08/18/24 1846 98 %     Weight 08/18/24 1848 145 lb (65.8 kg)     Height 08/18/24 1848 5' 5 (1.651 m)     Head Circumference --      Peak Flow --      Pain Score 08/18/24 1847 7     Pain Loc --       Pain Education --      Exclude from Growth Chart --     Most recent vital signs: Vitals:   08/18/24 1846  BP: (!) 152/103  Pulse: (!) 116  Resp: 17  Temp: 98.3 F (36.8 C)  SpO2: 98%     Physical Exam Vitals and nursing note reviewed.  During triage patient was hypertensive, tachycardic  General:          Awake, no distress.  CV:                  Good peripheral perfusion. Regular rate and rhythm. Resp:               Normal effort. no tachypnea.Equal breath sounds bilaterally.  Abd:                 No distention.  Soft nontender.Presence of 2 piercing next to umbilicus. Erythema, no secretions, tender to palpation. Other:                     ED Results / Procedures / Treatments   Labs (all labs ordered are listed, but only abnormal results are displayed) Labs Reviewed  COMPREHENSIVE METABOLIC PANEL WITH GFR - Abnormal; Notable for the following components:      Result Value   Glucose, Bld 134 (*)    Creatinine, Ser 1.15 (*)    AST 46 (*)    GFR, Estimated 58 (*)    All other components within normal limits  CBC WITH DIFFERENTIAL/PLATELET      PROCEDURES:  Critical Care performed:   .Foreign Body Removal  Date/Time: 08/18/2024 11:42 PM  Performed by: Janit Kast, PA-C Authorized by: Janit Kast, PA-C  Consent: Verbal consent obtained. Written consent not obtained Risks and benefits: risks, benefits and alternatives were discussed Consent given by: patient Patient understanding: patient states understanding of the procedure being performed Patient identity confirmed: verbally with patient Intake: Abdominal wall. Anesthesia: local infiltration  Anesthesia: Local Anesthetic: lidocaine  2% with epinephrine  Anesthetic total: 20 mL  Sedation: Patient sedated: no  Patient restrained: no Complexity: simple 2 objects recovered. Objects recovered: Piercing Post-procedure assessment: foreign body removed Patient tolerance: patient tolerated the  procedure well with no immediate complications     MEDICATIONS ORDERED IN ED: Medications  acetaminophen  (TYLENOL ) tablet 650 mg (650 mg Oral Given 08/18/24 2310)  lidocaine -EPINEPHrine  (XYLOCAINE  W/EPI) 2 %-1:100000 (with pres) injection 20 mL (20 mLs Intradermal Given 08/18/24 2308)  clindamycin  (CLEOCIN ) capsule 300 mg (300 mg Oral Given 08/18/24 2309)   Clinical Course as of 08/18/24 2346  Mon Aug 18, 2024  2141 Comprehensive metabolic panel(!) Electrolytes, BUN within normal limits.  Creatinine elevated 1.15.  AST elevated 46. [AE]  2142 CBC with Differential White blood cells, hemoglobin and platelets within normal limits [AE]    Clinical Course User  Index [AE] Janit Kast, PA-C    IMPRESSION / MDM / ASSESSMENT AND PLAN / ED COURSE  I reviewed the triage vital signs and the nursing notes.  Differential diagnosis includes, but is not limited to, cellulitis, abscess, foreing body.   Patient's presentation is most consistent with acute complicated illness / injury requiring diagnostic workup.   Marissa [name] is a 49 y.o., female who presents today with history of 3 days of wall abdominal pain.  See HPI for further information.  During triage patient was hypertensive, tachycardic.  Abdomen evidence of bilateral bruising next to the umbilicus.  Erythema, tenderness to palpation, no drainage.  Rest of physical exam is normal. Plan Ultrasound Removal of foreign bodies Clindamycin  Acetaminophen  Patient's diagnosis is consistent with cellulitis secondary to foreign body.. I independently reviewed and interpreted imaging and agree with radiologists findings. I did review the patient's allergies and medications.The patient is in stable and satisfactory condition for discharge home  Patient will be discharged home with prescriptions for clindamycin , acetaminophen . Patient is to follow up with PCP as needed or otherwise directed. Patient is given ED precautions to return to the ED for  any worsening or new symptoms. Discussed plan of care with patient, answered all of patient's questions, patient agreeable to plan of care. Advised patient to take medications according to the instructions on the label. Discussed possible side effects of new medications. Patient verbalized understanding.  FINAL CLINICAL IMPRESSION(S) / ED DIAGNOSES   Final diagnoses:  Cellulitis, unspecified cellulitis site  Foreign body (FB) in soft tissue     Rx / DC Orders   ED Discharge Orders          Ordered    clindamycin  (CLEOCIN ) 300 MG capsule  3 times daily        08/18/24 2346    acetaminophen  (ACETAMINOPHEN  8 HOUR) 650 MG CR tablet  Every 8 hours PRN        08/18/24 2346    fluconazole  (DIFLUCAN ) 150 MG tablet  Daily        08/18/24 2346             Note:  This document was prepared using Dragon voice recognition software and may include unintentional dictation errors.   Janit Kast, PA-C 08/18/24 2346    Willo Dunnings, MD 08/18/24 2358  "

## 2024-08-18 NOTE — ED Triage Notes (Signed)
 Pt in via POV, complaints of pain to bilateral abdominal dermal piercings, some exudate from left piercing noted, other piercing is not visible due to skin grown up around it.  Reports being seen here for same on Saturday, states antibiotics were never called in.  Ambulatory to triage, NAD noted at this time.

## 2024-08-18 NOTE — Discharge Instructions (Addendum)
 Diagnosed with cellulitis of the abdominal wall secondary to foreign body.  Please take clindamycin  1 capsule by mouth every 8 hours for 10 days.  You can take acetaminophen  1 tablet by mouth every 8 hours for pain.  Please drink plenty of fluids.  Please take fluconazole  1 tablet by mouth.  Please come back to ED with your PCP if you have new symptoms symptoms worsen.  It was a pleasure to help you today.  Franchon Ketterman, PA-C

## 2024-10-21 ENCOUNTER — Ambulatory Visit: Admit: 2024-10-21 | Payer: Self-pay | Admitting: Ophthalmology

## 2024-10-21 SURGERY — PHACOEMULSIFICATION, CATARACT, WITH IOL INSERTION
Anesthesia: Topical | Laterality: Left

## 2024-11-04 ENCOUNTER — Ambulatory Visit: Admit: 2024-11-04 | Payer: Self-pay | Admitting: Ophthalmology

## 2024-11-04 SURGERY — PHACOEMULSIFICATION, CATARACT, WITH IOL INSERTION
Anesthesia: Topical | Laterality: Right

## 2025-01-13 ENCOUNTER — Inpatient Hospital Stay

## 2025-01-20 ENCOUNTER — Inpatient Hospital Stay: Admitting: Oncology

## 2025-01-20 ENCOUNTER — Inpatient Hospital Stay
